# Patient Record
Sex: Female | Born: 1950
Health system: Southern US, Community
[De-identification: ages and names within clinical notes are randomized; demographics above are authoritative.]

## PROBLEM LIST (undated history)

## (undated) DIAGNOSIS — D45 Polycythemia vera: Secondary | ICD-10-CM

## (undated) DIAGNOSIS — I10 Essential (primary) hypertension: Secondary | ICD-10-CM

## (undated) DIAGNOSIS — G43909 Migraine, unspecified, not intractable, without status migrainosus: Secondary | ICD-10-CM

## (undated) DIAGNOSIS — G459 Transient cerebral ischemic attack, unspecified: Secondary | ICD-10-CM

## (undated) DIAGNOSIS — F329 Major depressive disorder, single episode, unspecified: Secondary | ICD-10-CM

## (undated) DIAGNOSIS — T8859XA Other complications of anesthesia, initial encounter: Secondary | ICD-10-CM

## (undated) DIAGNOSIS — E785 Hyperlipidemia, unspecified: Secondary | ICD-10-CM

## (undated) DIAGNOSIS — F0390 Unspecified dementia without behavioral disturbance: Secondary | ICD-10-CM

## (undated) DIAGNOSIS — M5410 Radiculopathy, site unspecified: Secondary | ICD-10-CM

## (undated) DIAGNOSIS — G540 Brachial plexus disorders: Secondary | ICD-10-CM

## (undated) DIAGNOSIS — R42 Dizziness and giddiness: Secondary | ICD-10-CM

## (undated) DIAGNOSIS — I639 Cerebral infarction, unspecified: Secondary | ICD-10-CM

## (undated) DIAGNOSIS — K219 Gastro-esophageal reflux disease without esophagitis: Secondary | ICD-10-CM

## (undated) DIAGNOSIS — J42 Unspecified chronic bronchitis: Secondary | ICD-10-CM

## (undated) DIAGNOSIS — R413 Other amnesia: Secondary | ICD-10-CM

## (undated) DIAGNOSIS — I498 Other specified cardiac arrhythmias: Secondary | ICD-10-CM

## (undated) DIAGNOSIS — F419 Anxiety disorder, unspecified: Secondary | ICD-10-CM

## (undated) DIAGNOSIS — T4145XA Adverse effect of unspecified anesthetic, initial encounter: Secondary | ICD-10-CM

## (undated) DIAGNOSIS — Z9981 Dependence on supplemental oxygen: Secondary | ICD-10-CM

## (undated) DIAGNOSIS — T7840XA Allergy, unspecified, initial encounter: Secondary | ICD-10-CM

## (undated) DIAGNOSIS — F32A Depression, unspecified: Secondary | ICD-10-CM

## (undated) DIAGNOSIS — M797 Fibromyalgia: Secondary | ICD-10-CM

## (undated) HISTORY — DX: Unspecified dementia, unspecified severity, without behavioral disturbance, psychotic disturbance, mood disturbance, and anxiety: F03.90

## (undated) HISTORY — DX: Dizziness and giddiness: R42

## (undated) HISTORY — DX: Radiculopathy, site unspecified: M54.10

## (undated) HISTORY — DX: Essential (primary) hypertension: I10

## (undated) HISTORY — DX: Hyperlipidemia, unspecified: E78.5

## (undated) HISTORY — PX: CHOLECYSTECTOMY: SHX55

## (undated) HISTORY — DX: Transient cerebral ischemic attack, unspecified: G45.9

## (undated) HISTORY — DX: Gastro-esophageal reflux disease without esophagitis: K21.9

## (undated) HISTORY — DX: Unspecified chronic bronchitis: J42

## (undated) HISTORY — DX: Other specified cardiac arrhythmias: I49.8

## (undated) HISTORY — PX: RESECTION RIB PARTIAL: SUR1246

## (undated) HISTORY — PX: DILATION AND CURETTAGE OF UTERUS: SHX78

## (undated) HISTORY — DX: Major depressive disorder, single episode, unspecified: F32.9

## (undated) HISTORY — PX: REDUCTION MAMMAPLASTY: SUR839

## (undated) HISTORY — DX: Anxiety disorder, unspecified: F41.9

## (undated) HISTORY — PX: GALLBLADDER SURGERY: SHX652

## (undated) HISTORY — PX: RHINOPLASTY: SUR1284

## (undated) HISTORY — DX: Migraine, unspecified, not intractable, without status migrainosus: G43.909

## (undated) HISTORY — DX: Other amnesia: R41.3

## (undated) HISTORY — PX: KNEE SURGERY: SHX244

## (undated) HISTORY — DX: Cerebral infarction, unspecified: I63.9

## (undated) HISTORY — DX: Depression, unspecified: F32.A

## (undated) HISTORY — DX: Brachial plexus disorders: G54.0

## (undated) HISTORY — PX: CYST REMOVAL HAND: SHX6279

## (undated) HISTORY — PX: OTHER SURGICAL HISTORY: SHX169

## (undated) HISTORY — DX: Fibromyalgia: M79.7

## (undated) HISTORY — DX: Polycythemia vera: D45

## (undated) HISTORY — DX: Allergy, unspecified, initial encounter: T78.40XA

## (undated) HISTORY — DX: Dependence on supplemental oxygen: Z99.81

## (undated) HISTORY — PX: APPENDECTOMY: SHX54

---

## 1977-12-02 HISTORY — PX: BREAST SURGERY: SHX581

## 1978-12-02 HISTORY — PX: OTHER SURGICAL HISTORY: SHX169

## 1979-12-03 DIAGNOSIS — G540 Brachial plexus disorders: Secondary | ICD-10-CM

## 1979-12-03 HISTORY — DX: Brachial plexus disorders: G54.0

## 1998-11-17 ENCOUNTER — Other Ambulatory Visit: Admission: RE | Admit: 1998-11-17 | Discharge: 1998-11-17 | Payer: Self-pay | Admitting: Gynecology

## 1998-11-18 ENCOUNTER — Emergency Department (HOSPITAL_COMMUNITY): Admission: EM | Admit: 1998-11-18 | Discharge: 1998-11-19 | Payer: Self-pay | Admitting: Emergency Medicine

## 1998-11-19 ENCOUNTER — Encounter: Payer: Self-pay | Admitting: Emergency Medicine

## 1998-11-28 ENCOUNTER — Encounter: Payer: Self-pay | Admitting: Neurology

## 1998-11-28 ENCOUNTER — Ambulatory Visit (HOSPITAL_COMMUNITY): Admission: RE | Admit: 1998-11-28 | Discharge: 1998-11-28 | Payer: Self-pay | Admitting: Neurology

## 1998-12-06 ENCOUNTER — Other Ambulatory Visit: Admission: RE | Admit: 1998-12-06 | Discharge: 1998-12-06 | Payer: Self-pay | Admitting: Gynecology

## 1998-12-12 ENCOUNTER — Ambulatory Visit (HOSPITAL_COMMUNITY): Admission: RE | Admit: 1998-12-12 | Discharge: 1998-12-12 | Payer: Self-pay | Admitting: *Deleted

## 1999-03-04 ENCOUNTER — Emergency Department (HOSPITAL_COMMUNITY): Admission: EM | Admit: 1999-03-04 | Discharge: 1999-03-04 | Payer: Self-pay | Admitting: Emergency Medicine

## 1999-04-30 ENCOUNTER — Emergency Department (HOSPITAL_COMMUNITY): Admission: EM | Admit: 1999-04-30 | Discharge: 1999-04-30 | Payer: Self-pay | Admitting: Emergency Medicine

## 1999-07-08 ENCOUNTER — Emergency Department (HOSPITAL_COMMUNITY): Admission: EM | Admit: 1999-07-08 | Discharge: 1999-07-08 | Payer: Self-pay | Admitting: Emergency Medicine

## 2000-04-15 ENCOUNTER — Other Ambulatory Visit: Admission: RE | Admit: 2000-04-15 | Discharge: 2000-04-15 | Payer: Self-pay | Admitting: *Deleted

## 2000-05-27 ENCOUNTER — Encounter: Payer: Self-pay | Admitting: Orthopaedic Surgery

## 2000-05-27 ENCOUNTER — Encounter: Admission: RE | Admit: 2000-05-27 | Discharge: 2000-05-27 | Payer: Self-pay | Admitting: Orthopaedic Surgery

## 2000-07-28 ENCOUNTER — Encounter: Admission: RE | Admit: 2000-07-28 | Discharge: 2000-07-28 | Payer: Self-pay | Admitting: Neurological Surgery

## 2000-07-28 ENCOUNTER — Encounter: Payer: Self-pay | Admitting: Neurological Surgery

## 2001-04-15 ENCOUNTER — Other Ambulatory Visit: Admission: RE | Admit: 2001-04-15 | Discharge: 2001-04-15 | Payer: Self-pay | Admitting: *Deleted

## 2002-05-03 ENCOUNTER — Other Ambulatory Visit: Admission: RE | Admit: 2002-05-03 | Discharge: 2002-05-03 | Payer: Self-pay | Admitting: Obstetrics and Gynecology

## 2003-05-06 ENCOUNTER — Other Ambulatory Visit: Admission: RE | Admit: 2003-05-06 | Discharge: 2003-05-06 | Payer: Self-pay | Admitting: Obstetrics and Gynecology

## 2003-10-10 ENCOUNTER — Encounter: Admission: RE | Admit: 2003-10-10 | Discharge: 2003-10-10 | Payer: Self-pay | Admitting: Obstetrics and Gynecology

## 2004-05-10 ENCOUNTER — Other Ambulatory Visit: Admission: RE | Admit: 2004-05-10 | Discharge: 2004-05-10 | Payer: Self-pay | Admitting: Obstetrics and Gynecology

## 2004-05-30 ENCOUNTER — Encounter: Admission: RE | Admit: 2004-05-30 | Discharge: 2004-05-30 | Payer: Self-pay | Admitting: Obstetrics and Gynecology

## 2004-10-04 ENCOUNTER — Ambulatory Visit (HOSPITAL_COMMUNITY): Admission: RE | Admit: 2004-10-04 | Discharge: 2004-10-04 | Payer: Self-pay | Admitting: Hematology & Oncology

## 2004-10-04 ENCOUNTER — Ambulatory Visit: Payer: Self-pay | Admitting: Hematology & Oncology

## 2004-10-08 ENCOUNTER — Ambulatory Visit: Payer: Self-pay | Admitting: Internal Medicine

## 2004-10-30 ENCOUNTER — Ambulatory Visit: Payer: Self-pay | Admitting: Internal Medicine

## 2004-10-31 ENCOUNTER — Encounter: Admission: RE | Admit: 2004-10-31 | Discharge: 2004-10-31 | Payer: Self-pay | Admitting: Internal Medicine

## 2004-11-08 ENCOUNTER — Ambulatory Visit: Payer: Self-pay | Admitting: Internal Medicine

## 2004-12-02 LAB — HM COLONOSCOPY

## 2004-12-10 ENCOUNTER — Ambulatory Visit: Payer: Self-pay | Admitting: Infectious Diseases

## 2004-12-10 ENCOUNTER — Ambulatory Visit: Payer: Self-pay | Admitting: Hematology & Oncology

## 2004-12-26 ENCOUNTER — Ambulatory Visit: Payer: Self-pay | Admitting: Infectious Diseases

## 2005-02-08 ENCOUNTER — Ambulatory Visit: Payer: Self-pay | Admitting: Hematology & Oncology

## 2005-03-28 ENCOUNTER — Ambulatory Visit: Payer: Self-pay | Admitting: Internal Medicine

## 2005-04-12 ENCOUNTER — Ambulatory Visit: Payer: Self-pay | Admitting: Hematology & Oncology

## 2005-05-30 ENCOUNTER — Ambulatory Visit: Payer: Self-pay | Admitting: Internal Medicine

## 2005-05-31 ENCOUNTER — Encounter: Admission: RE | Admit: 2005-05-31 | Discharge: 2005-05-31 | Payer: Self-pay | Admitting: Obstetrics and Gynecology

## 2005-06-14 ENCOUNTER — Ambulatory Visit: Payer: Self-pay | Admitting: Hematology & Oncology

## 2005-07-16 ENCOUNTER — Ambulatory Visit: Payer: Self-pay | Admitting: Internal Medicine

## 2005-08-16 ENCOUNTER — Ambulatory Visit: Payer: Self-pay | Admitting: Hematology & Oncology

## 2005-10-09 ENCOUNTER — Ambulatory Visit: Payer: Self-pay | Admitting: Hematology & Oncology

## 2005-11-05 ENCOUNTER — Ambulatory Visit: Payer: Self-pay | Admitting: Internal Medicine

## 2005-11-07 ENCOUNTER — Ambulatory Visit: Payer: Self-pay | Admitting: Internal Medicine

## 2005-11-18 ENCOUNTER — Ambulatory Visit: Payer: Self-pay | Admitting: Gastroenterology

## 2005-12-02 LAB — HM DEXA SCAN

## 2005-12-03 ENCOUNTER — Ambulatory Visit: Payer: Self-pay | Admitting: Gastroenterology

## 2005-12-03 ENCOUNTER — Encounter (INDEPENDENT_AMBULATORY_CARE_PROVIDER_SITE_OTHER): Payer: Self-pay | Admitting: Specialist

## 2005-12-05 ENCOUNTER — Ambulatory Visit: Payer: Self-pay | Admitting: Hematology & Oncology

## 2005-12-24 ENCOUNTER — Encounter: Admission: RE | Admit: 2005-12-24 | Discharge: 2005-12-24 | Payer: Self-pay | Admitting: Neurology

## 2006-01-09 ENCOUNTER — Ambulatory Visit: Payer: Self-pay | Admitting: Internal Medicine

## 2006-01-14 ENCOUNTER — Ambulatory Visit: Payer: Self-pay | Admitting: Gastroenterology

## 2006-01-29 ENCOUNTER — Ambulatory Visit: Payer: Self-pay | Admitting: Hematology & Oncology

## 2006-02-10 ENCOUNTER — Ambulatory Visit: Payer: Self-pay | Admitting: Internal Medicine

## 2006-03-23 ENCOUNTER — Emergency Department (HOSPITAL_COMMUNITY): Admission: EM | Admit: 2006-03-23 | Discharge: 2006-03-23 | Payer: Self-pay | Admitting: Emergency Medicine

## 2006-04-01 ENCOUNTER — Ambulatory Visit: Payer: Self-pay | Admitting: Hematology & Oncology

## 2006-04-02 LAB — CBC WITH DIFFERENTIAL/PLATELET
BASO%: 0.6 % (ref 0.0–2.0)
Basophils Absolute: 0 10*3/uL (ref 0.0–0.1)
EOS%: 1.6 % (ref 0.0–7.0)
HGB: 12.7 g/dL (ref 11.6–15.9)
MCH: 27.4 pg (ref 26.0–34.0)
MONO#: 0.4 10*3/uL (ref 0.1–0.9)
RDW: 16.2 % — ABNORMAL HIGH (ref 11.3–14.5)
WBC: 4.3 10*3/uL (ref 3.9–10.0)
lymph#: 1.7 10*3/uL (ref 0.9–3.3)

## 2006-04-02 LAB — FERRITIN: Ferritin: 7 ng/mL — ABNORMAL LOW (ref 10–291)

## 2006-04-15 ENCOUNTER — Ambulatory Visit (HOSPITAL_COMMUNITY): Admission: RE | Admit: 2006-04-15 | Discharge: 2006-04-15 | Payer: Self-pay | Admitting: Hematology & Oncology

## 2006-04-15 ENCOUNTER — Encounter (INDEPENDENT_AMBULATORY_CARE_PROVIDER_SITE_OTHER): Payer: Self-pay | Admitting: *Deleted

## 2006-04-15 ENCOUNTER — Ambulatory Visit: Payer: Self-pay | Admitting: Hematology & Oncology

## 2006-06-02 ENCOUNTER — Encounter: Admission: RE | Admit: 2006-06-02 | Discharge: 2006-06-02 | Payer: Self-pay | Admitting: Obstetrics and Gynecology

## 2006-06-05 ENCOUNTER — Ambulatory Visit: Payer: Self-pay | Admitting: Hematology & Oncology

## 2006-06-12 LAB — CBC WITH DIFFERENTIAL/PLATELET
BASO%: 0.5 % (ref 0.0–2.0)
EOS%: 1.6 % (ref 0.0–7.0)
HCT: 41.3 % (ref 34.8–46.6)
MCH: 28.3 pg (ref 26.0–34.0)
MCHC: 33.8 g/dL (ref 32.0–36.0)
MONO#: 0.5 10*3/uL (ref 0.1–0.9)
NEUT%: 52.3 % (ref 39.6–76.8)
RBC: 4.94 10*6/uL (ref 3.70–5.32)
WBC: 5.3 10*3/uL (ref 3.9–10.0)
lymph#: 1.9 10*3/uL (ref 0.9–3.3)

## 2006-07-23 ENCOUNTER — Ambulatory Visit: Payer: Self-pay | Admitting: Hematology & Oncology

## 2006-07-24 LAB — CBC WITH DIFFERENTIAL/PLATELET
BASO%: 0.6 % (ref 0.0–2.0)
LYMPH%: 28.7 % (ref 14.0–48.0)
MCH: 28.1 pg (ref 26.0–34.0)
MCHC: 33.8 g/dL (ref 32.0–36.0)
MCV: 83.2 fL (ref 81.0–101.0)
MONO%: 8.8 % (ref 0.0–13.0)
NEUT%: 60.6 % (ref 39.6–76.8)
Platelets: 299 10*3/uL (ref 145–400)
RBC: 5.37 10*6/uL — ABNORMAL HIGH (ref 3.70–5.32)

## 2006-07-24 LAB — COMPREHENSIVE METABOLIC PANEL
ALT: 18 U/L (ref 0–40)
AST: 20 U/L (ref 0–37)
Albumin: 4.2 g/dL (ref 3.5–5.2)
Alkaline Phosphatase: 86 U/L (ref 39–117)
BUN: 14 mg/dL (ref 6–23)
Calcium: 9.7 mg/dL (ref 8.4–10.5)
Chloride: 100 mEq/L (ref 96–112)
Potassium: 4.8 mEq/L (ref 3.5–5.3)
Sodium: 137 mEq/L (ref 135–145)
Total Protein: 6.8 g/dL (ref 6.0–8.3)

## 2006-08-01 LAB — CBC WITH DIFFERENTIAL/PLATELET
BASO%: 0.7 % (ref 0.0–2.0)
EOS%: 1.4 % (ref 0.0–7.0)
HCT: 37 % (ref 34.8–46.6)
LYMPH%: 33 % (ref 14.0–48.0)
MCH: 27.9 pg (ref 26.0–34.0)
MCHC: 33.2 g/dL (ref 32.0–36.0)
MCV: 84.1 fL (ref 81.0–101.0)
MONO%: 8.3 % (ref 0.0–13.0)
NEUT%: 56.6 % (ref 39.6–76.8)
Platelets: 307 10*3/uL (ref 145–400)
RBC: 4.4 10*6/uL (ref 3.70–5.32)
WBC: 4.7 10*3/uL (ref 3.9–10.0)

## 2006-09-09 ENCOUNTER — Ambulatory Visit: Payer: Self-pay | Admitting: Hematology & Oncology

## 2006-09-11 LAB — CBC WITH DIFFERENTIAL/PLATELET
Basophils Absolute: 0 10*3/uL (ref 0.0–0.1)
Eosinophils Absolute: 0.1 10*3/uL (ref 0.0–0.5)
HGB: 13.8 g/dL (ref 11.6–15.9)
MONO#: 0.4 10*3/uL (ref 0.1–0.9)
NEUT#: 2.8 10*3/uL (ref 1.5–6.5)
RBC: 5.02 10*6/uL (ref 3.70–5.32)
RDW: 14.4 % (ref 11.3–14.5)
WBC: 4.7 10*3/uL (ref 3.9–10.0)

## 2006-09-22 ENCOUNTER — Ambulatory Visit: Payer: Self-pay | Admitting: Internal Medicine

## 2006-10-20 ENCOUNTER — Ambulatory Visit: Payer: Self-pay | Admitting: Hematology & Oncology

## 2006-10-20 LAB — CBC WITH DIFFERENTIAL/PLATELET
BASO%: 0.5 % (ref 0.0–2.0)
LYMPH%: 23.6 % (ref 14.0–48.0)
MCHC: 32.6 g/dL (ref 32.0–36.0)
MONO#: 0.5 10*3/uL (ref 0.1–0.9)
NEUT#: 3.9 10*3/uL (ref 1.5–6.5)
RBC: 4.36 10*6/uL (ref 3.70–5.32)
RDW: 14.3 % (ref 11.3–14.5)
WBC: 5.9 10*3/uL (ref 3.9–10.0)
lymph#: 1.4 10*3/uL (ref 0.9–3.3)

## 2006-12-01 LAB — CBC & DIFF AND RETIC
Basophils Absolute: 0 10*3/uL (ref 0.0–0.1)
Eosinophils Absolute: 0.1 10*3/uL (ref 0.0–0.5)
HCT: 38.6 % (ref 34.8–46.6)
HGB: 12.2 g/dL (ref 11.6–15.9)
IRF: 0.33 (ref 0.130–0.330)
NEUT#: 3.4 10*3/uL (ref 1.5–6.5)
NEUT%: 67.6 % (ref 39.6–76.8)
RDW: 15.8 % — ABNORMAL HIGH (ref 11.3–14.5)
RETIC #: 75.8 10*3/uL (ref 19.7–115.1)
lymph#: 1.2 10*3/uL (ref 0.9–3.3)

## 2006-12-01 LAB — CHCC SMEAR

## 2006-12-01 LAB — FERRITIN: Ferritin: 4 ng/mL — ABNORMAL LOW (ref 10–291)

## 2006-12-03 ENCOUNTER — Ambulatory Visit (HOSPITAL_COMMUNITY): Admission: RE | Admit: 2006-12-03 | Discharge: 2006-12-03 | Payer: Self-pay | Admitting: Hematology & Oncology

## 2006-12-10 ENCOUNTER — Ambulatory Visit: Payer: Self-pay | Admitting: Hematology & Oncology

## 2006-12-15 LAB — CBC WITH DIFFERENTIAL/PLATELET
BASO%: 0.4 % (ref 0.0–2.0)
HCT: 34.6 % — ABNORMAL LOW (ref 34.8–46.6)
MCHC: 32.6 g/dL (ref 32.0–36.0)
MONO#: 0.4 10*3/uL (ref 0.1–0.9)
NEUT#: 3.3 10*3/uL (ref 1.5–6.5)
RBC: 4.73 10*6/uL (ref 3.70–5.32)
WBC: 5 10*3/uL (ref 3.9–10.0)
lymph#: 1.3 10*3/uL (ref 0.9–3.3)

## 2006-12-18 LAB — LUPUS ANTICOAGULANT PANEL
DRVVT: 37 secs (ref 31.9–44.2)
PTT Lupus Anticoagulant: 44 secs (ref 36.3–48.8)

## 2006-12-18 LAB — HOMOCYSTEINE: Homocysteine: 9.8 umol/L (ref 4.0–15.4)

## 2006-12-18 LAB — BETA-2 GLYCOPROTEIN ANTIBODIES
Beta-2 Glyco I IgG: <4 U/mL (ref <20)
Beta-2-Glycoprotein I IgA: <4 U/mL (ref <10)

## 2007-01-22 ENCOUNTER — Ambulatory Visit: Payer: Self-pay | Admitting: Hematology & Oncology

## 2007-01-26 LAB — CBC WITH DIFFERENTIAL/PLATELET
BASO%: 0.4 % (ref 0.0–2.0)
EOS%: 0.8 % (ref 0.0–7.0)
LYMPH%: 24.4 % (ref 14.0–48.0)
MCH: 24.2 pg — ABNORMAL LOW (ref 26.0–34.0)
MCHC: 33.2 g/dL (ref 32.0–36.0)
MCV: 72.7 fL — ABNORMAL LOW (ref 81.0–101.0)
MONO%: 6.9 % (ref 0.0–13.0)
NEUT#: 3.7 10*3/uL (ref 1.5–6.5)
Platelets: 354 10*3/uL (ref 145–400)
RBC: 4.99 10*6/uL (ref 3.70–5.32)
RDW: 17.2 % — ABNORMAL HIGH (ref 11.3–14.5)

## 2007-01-28 LAB — VISCOSITY, SERUM: Viscosity, Serum: 1.2 mPa.S (ref 1.1–2.0)

## 2007-01-28 LAB — FERRITIN: Ferritin: 4 ng/mL — ABNORMAL LOW (ref 10–291)

## 2007-03-18 ENCOUNTER — Ambulatory Visit: Payer: Self-pay | Admitting: Hematology & Oncology

## 2007-03-23 LAB — CBC WITH DIFFERENTIAL/PLATELET
BASO%: 0.6 % (ref 0.0–2.0)
LYMPH%: 31.6 % (ref 14.0–48.0)
MCHC: 33.9 g/dL (ref 32.0–36.0)
MONO#: 0.5 10*3/uL (ref 0.1–0.9)
Platelets: 282 10*3/uL (ref 145–400)
RBC: 4.96 10*6/uL (ref 3.70–5.32)
RDW: 16.7 % — ABNORMAL HIGH (ref 11.3–14.5)
WBC: 4.6 10*3/uL (ref 3.9–10.0)

## 2007-03-23 LAB — HEPATIC FUNCTION PANEL
ALT: 19 U/L (ref 0–35)
AST: 22 U/L (ref 0–37)
Albumin: 4.5 g/dL (ref 3.5–5.2)
Total Protein: 7 g/dL (ref 6.0–8.3)

## 2007-05-08 ENCOUNTER — Ambulatory Visit: Payer: Self-pay | Admitting: Hematology & Oncology

## 2007-05-08 LAB — CBC WITH DIFFERENTIAL/PLATELET
BASO%: 0.6 % (ref 0.0–2.0)
HCT: 38 % (ref 34.8–46.6)
MCHC: 33.6 g/dL (ref 32.0–36.0)
MONO#: 0.5 10*3/uL (ref 0.1–0.9)
NEUT#: 3 10*3/uL (ref 1.5–6.5)
NEUT%: 59 % (ref 39.6–76.8)
RBC: 4.97 10*6/uL (ref 3.70–5.32)
WBC: 5.1 10*3/uL (ref 3.9–10.0)
lymph#: 1.5 10*3/uL (ref 0.9–3.3)

## 2007-06-04 LAB — CBC WITH DIFFERENTIAL/PLATELET
Basophils Absolute: 0 10*3/uL (ref 0.0–0.1)
HCT: 38.1 % (ref 34.8–46.6)
HGB: 13.2 g/dL (ref 11.6–15.9)
MONO#: 0.5 10*3/uL (ref 0.1–0.9)
NEUT%: 55.7 % (ref 39.6–76.8)
WBC: 5.7 10*3/uL (ref 3.9–10.0)
lymph#: 1.8 10*3/uL (ref 0.9–3.3)

## 2007-06-22 ENCOUNTER — Encounter: Admission: RE | Admit: 2007-06-22 | Discharge: 2007-06-22 | Payer: Self-pay | Admitting: Neurology

## 2007-07-03 ENCOUNTER — Encounter: Payer: Self-pay | Admitting: Internal Medicine

## 2007-07-04 DIAGNOSIS — E785 Hyperlipidemia, unspecified: Secondary | ICD-10-CM | POA: Insufficient documentation

## 2007-07-04 DIAGNOSIS — D45 Polycythemia vera: Secondary | ICD-10-CM

## 2007-07-04 DIAGNOSIS — Z87898 Personal history of other specified conditions: Secondary | ICD-10-CM

## 2007-07-04 DIAGNOSIS — E66811 Obesity, class 1: Secondary | ICD-10-CM | POA: Insufficient documentation

## 2007-07-04 DIAGNOSIS — J3089 Other allergic rhinitis: Secondary | ICD-10-CM

## 2007-07-04 DIAGNOSIS — K219 Gastro-esophageal reflux disease without esophagitis: Secondary | ICD-10-CM | POA: Insufficient documentation

## 2007-07-04 DIAGNOSIS — I10 Essential (primary) hypertension: Secondary | ICD-10-CM

## 2007-07-04 DIAGNOSIS — J302 Other seasonal allergic rhinitis: Secondary | ICD-10-CM

## 2007-07-04 DIAGNOSIS — R51 Headache: Secondary | ICD-10-CM

## 2007-07-04 DIAGNOSIS — E669 Obesity, unspecified: Secondary | ICD-10-CM | POA: Insufficient documentation

## 2007-07-04 DIAGNOSIS — Z8673 Personal history of transient ischemic attack (TIA), and cerebral infarction without residual deficits: Secondary | ICD-10-CM | POA: Insufficient documentation

## 2007-07-04 DIAGNOSIS — R519 Headache, unspecified: Secondary | ICD-10-CM | POA: Insufficient documentation

## 2007-07-04 DIAGNOSIS — M797 Fibromyalgia: Secondary | ICD-10-CM

## 2007-07-04 DIAGNOSIS — J4520 Mild intermittent asthma, uncomplicated: Secondary | ICD-10-CM | POA: Insufficient documentation

## 2007-07-04 DIAGNOSIS — N840 Polyp of corpus uteri: Secondary | ICD-10-CM

## 2007-07-06 ENCOUNTER — Encounter: Admission: RE | Admit: 2007-07-06 | Discharge: 2007-07-06 | Payer: Self-pay | Admitting: Obstetrics and Gynecology

## 2007-07-29 ENCOUNTER — Ambulatory Visit: Payer: Self-pay | Admitting: Hematology & Oncology

## 2007-07-31 LAB — CBC WITH DIFFERENTIAL/PLATELET
Basophils Absolute: 0 10*3/uL (ref 0.0–0.1)
Eosinophils Absolute: 0.1 10*3/uL (ref 0.0–0.5)
HGB: 13 g/dL (ref 11.6–15.9)
MCV: 78.2 fL — ABNORMAL LOW (ref 81.0–101.0)
MONO%: 7.7 % (ref 0.0–13.0)
NEUT#: 2.5 10*3/uL (ref 1.5–6.5)
RDW: 15.3 % — ABNORMAL HIGH (ref 11.3–14.5)

## 2007-08-01 LAB — C-REACTIVE PROTEIN: CRP: 0.6 mg/dL — ABNORMAL HIGH

## 2007-08-04 LAB — ANA: Anti Nuclear Antibody(ANA): NEGATIVE

## 2007-08-05 LAB — RHEUMATOID FACTOR: Rhuematoid fact SerPl-aCnc: <20 IU/mL (ref 0–20)

## 2007-08-05 LAB — HLA-B27 ANTIGEN

## 2007-09-16 ENCOUNTER — Ambulatory Visit: Payer: Self-pay | Admitting: Hematology & Oncology

## 2007-09-18 LAB — CBC WITH DIFFERENTIAL/PLATELET
BASO%: 0.5 % (ref 0.0–2.0)
EOS%: 1.6 % (ref 0.0–7.0)
HGB: 12.7 g/dL (ref 11.6–15.9)
MCH: 27 pg (ref 26.0–34.0)
MCHC: 34.3 g/dL (ref 32.0–36.0)
RBC: 4.68 10*6/uL (ref 3.70–5.32)
RDW: 14.8 % — ABNORMAL HIGH (ref 11.3–14.5)
lymph#: 1.7 10*3/uL (ref 0.9–3.3)

## 2007-09-18 LAB — FERRITIN: Ferritin: 6 ng/mL — ABNORMAL LOW (ref 10–291)

## 2007-09-18 LAB — CHCC SMEAR

## 2007-09-23 ENCOUNTER — Encounter: Payer: Self-pay | Admitting: *Deleted

## 2007-10-15 ENCOUNTER — Telehealth (INDEPENDENT_AMBULATORY_CARE_PROVIDER_SITE_OTHER): Payer: Self-pay | Admitting: *Deleted

## 2007-10-16 ENCOUNTER — Ambulatory Visit: Payer: Self-pay | Admitting: Internal Medicine

## 2007-10-16 DIAGNOSIS — J069 Acute upper respiratory infection, unspecified: Secondary | ICD-10-CM | POA: Insufficient documentation

## 2007-10-20 ENCOUNTER — Encounter: Payer: Self-pay | Admitting: Internal Medicine

## 2007-10-20 DIAGNOSIS — M5137 Other intervertebral disc degeneration, lumbosacral region: Secondary | ICD-10-CM

## 2007-10-20 DIAGNOSIS — F331 Major depressive disorder, recurrent, moderate: Secondary | ICD-10-CM

## 2007-10-20 DIAGNOSIS — G43009 Migraine without aura, not intractable, without status migrainosus: Secondary | ICD-10-CM

## 2007-10-26 ENCOUNTER — Ambulatory Visit: Payer: Self-pay | Admitting: Hematology & Oncology

## 2007-10-28 ENCOUNTER — Telehealth: Payer: Self-pay | Admitting: Internal Medicine

## 2007-10-28 ENCOUNTER — Encounter: Payer: Self-pay | Admitting: Internal Medicine

## 2007-11-02 ENCOUNTER — Encounter: Payer: Self-pay | Admitting: Internal Medicine

## 2007-11-02 LAB — FERRITIN: Ferritin: 7 ng/mL — ABNORMAL LOW (ref 10–291)

## 2007-11-02 LAB — CBC & DIFF AND RETIC
Basophils Absolute: 0 10*3/uL (ref 0.0–0.1)
Eosinophils Absolute: 0.1 10*3/uL (ref 0.0–0.5)
HCT: 37.6 % (ref 34.8–46.6)
HGB: 12.8 g/dL (ref 11.6–15.9)
LYMPH%: 36.3 % (ref 14.0–48.0)
MCV: 78.5 fL — ABNORMAL LOW (ref 81.0–101.0)
MONO%: 7.8 % (ref 0.0–13.0)
NEUT#: 2.8 10*3/uL (ref 1.5–6.5)
NEUT%: 53.5 % (ref 39.6–76.8)
Platelets: 268 10*3/uL (ref 145–400)
RBC: 4.79 10*6/uL (ref 3.70–5.32)

## 2007-11-17 ENCOUNTER — Ambulatory Visit: Payer: Self-pay | Admitting: Internal Medicine

## 2007-11-17 LAB — CONVERTED CEMR LAB
ALT: 30 units/L (ref 0–35)
AST: 29 units/L (ref 0–37)
Albumin: 3.5 g/dL (ref 3.5–5.2)
Alkaline Phosphatase: 70 units/L (ref 39–117)
BUN: 12 mg/dL (ref 6–23)
Basophils Absolute: 0 10*3/uL (ref 0.0–0.1)
Basophils Relative: 0.4 % (ref 0.0–1.0)
Bilirubin Urine: NEGATIVE
Bilirubin, Direct: 0.1 mg/dL (ref 0.0–0.3)
CO2: 26 meq/L (ref 19–32)
Calcium: 8.7 mg/dL (ref 8.4–10.5)
Chloride: 109 meq/L (ref 96–112)
Cholesterol: 171 mg/dL (ref 0–200)
Creatinine, Ser: 0.8 mg/dL (ref 0.4–1.2)
Crystals: NEGATIVE
Eosinophils Absolute: 0.1 10*3/uL (ref 0.0–0.6)
Eosinophils Relative: 2.8 % (ref 0.0–5.0)
GFR calc Af Amer: 95 mL/min
GFR calc non Af Amer: 79 mL/min
Glucose, Bld: 107 mg/dL — ABNORMAL HIGH (ref 70–99)
HCT: 35.2 % — ABNORMAL LOW (ref 36.0–46.0)
HDL: 69.4 mg/dL (ref >39.0)
Hemoglobin, Urine: NEGATIVE
Hemoglobin: 11.9 g/dL — ABNORMAL LOW (ref 12.0–15.0)
Ketones, ur: NEGATIVE mg/dL
LDL Cholesterol: 79 mg/dL (ref 0–99)
Lymphocytes Relative: 38.6 % (ref 12.0–46.0)
MCHC: 33.7 g/dL (ref 30.0–36.0)
MCV: 79.4 fL (ref 78.0–100.0)
Monocytes Absolute: 0.5 10*3/uL (ref 0.2–0.7)
Monocytes Relative: 11.2 % — ABNORMAL HIGH (ref 3.0–11.0)
Mucus, UA: NEGATIVE
Neutro Abs: 2.2 10*3/uL (ref 1.4–7.7)
Neutrophils Relative %: 47 % (ref 43.0–77.0)
Nitrite: NEGATIVE
Platelets: 237 10*3/uL (ref 150–400)
Potassium: 3.7 meq/L (ref 3.5–5.1)
RBC / HPF: NONE SEEN
RBC: 4.43 M/uL (ref 3.87–5.11)
RDW: 13.6 % (ref 11.5–14.6)
Sodium: 142 meq/L (ref 135–145)
Specific Gravity, Urine: 1.025 (ref 1.000–1.03)
TSH: 2.16 microintl units/mL (ref 0.35–5.50)
Total Bilirubin: 0.6 mg/dL (ref 0.3–1.2)
Total CHOL/HDL Ratio: 2.5
Total Protein, Urine: NEGATIVE mg/dL
Total Protein: 6.2 g/dL (ref 6.0–8.3)
Triglycerides: 114 mg/dL (ref 0–149)
Urine Glucose: NEGATIVE mg/dL
Urobilinogen, UA: 0.2 (ref 0.0–1.0)
VLDL: 23 mg/dL (ref 0–40)
WBC: 4.6 10*3/uL (ref 4.5–10.5)
pH: 5.5 (ref 5.0–8.0)

## 2007-12-23 ENCOUNTER — Ambulatory Visit: Payer: Self-pay | Admitting: Internal Medicine

## 2007-12-31 ENCOUNTER — Ambulatory Visit: Payer: Self-pay | Admitting: Hematology & Oncology

## 2008-01-04 ENCOUNTER — Encounter: Payer: Self-pay | Admitting: Internal Medicine

## 2008-01-04 LAB — CBC & DIFF AND RETIC
Basophils Absolute: 0 10*3/uL (ref 0.0–0.1)
EOS%: 0.7 % (ref 0.0–7.0)
Eosinophils Absolute: 0 10*3/uL (ref 0.0–0.5)
HGB: 13.4 g/dL (ref 11.6–15.9)
LYMPH%: 27 % (ref 14.0–48.0)
MCH: 25.6 pg — ABNORMAL LOW (ref 26.0–34.0)
MCV: 77.7 fL — ABNORMAL LOW (ref 81.0–101.0)
MONO%: 5.2 % (ref 0.0–13.0)
NEUT#: 3.7 10*3/uL (ref 1.5–6.5)
NEUT%: 66.8 % (ref 39.6–76.8)
Platelets: 299 10*3/uL (ref 145–400)
RDW: 14.4 % (ref 11.3–14.5)

## 2008-01-04 LAB — FERRITIN: Ferritin: 6 ng/mL — ABNORMAL LOW (ref 10–291)

## 2008-01-04 LAB — COMPREHENSIVE METABOLIC PANEL
ALT: 16 U/L (ref 0–35)
AST: 17 U/L (ref 0–37)
BUN: 15 mg/dL (ref 6–23)
CO2: 22 mEq/L (ref 19–32)
Calcium: 9.5 mg/dL (ref 8.4–10.5)
Chloride: 108 mEq/L (ref 96–112)
Creatinine, Ser: 0.83 mg/dL (ref 0.40–1.20)
Total Bilirubin: 0.5 mg/dL (ref 0.3–1.2)

## 2008-02-11 ENCOUNTER — Ambulatory Visit: Payer: Self-pay | Admitting: Hematology & Oncology

## 2008-02-15 ENCOUNTER — Encounter: Payer: Self-pay | Admitting: Internal Medicine

## 2008-02-15 LAB — CBC & DIFF AND RETIC
Basophils Absolute: 0 10*3/uL (ref 0.0–0.1)
EOS%: 1.1 % (ref 0.0–7.0)
Eosinophils Absolute: 0.1 10*3/uL (ref 0.0–0.5)
HGB: 11.4 g/dL — ABNORMAL LOW (ref 11.6–15.9)
IRF: 0.38 — ABNORMAL HIGH (ref 0.130–0.330)
NEUT#: 4.1 10*3/uL (ref 1.5–6.5)
RBC: 4.57 10*6/uL (ref 3.70–5.32)
RDW: 14.3 % (ref 11.3–14.5)
RETIC #: 68.1 10*3/uL (ref 19.7–115.1)
Retic %: 1.5 % (ref 0.4–2.3)
WBC: 6.1 10*3/uL (ref 3.9–10.0)
lymph#: 1.5 10*3/uL (ref 0.9–3.3)

## 2008-02-15 LAB — COMPREHENSIVE METABOLIC PANEL
ALT: 17 U/L (ref 0–35)
AST: 16 U/L (ref 0–37)
Alkaline Phosphatase: 75 U/L (ref 39–117)
BUN: 13 mg/dL (ref 6–23)
Calcium: 9.7 mg/dL (ref 8.4–10.5)
Chloride: 102 mEq/L (ref 96–112)
Creatinine, Ser: 0.87 mg/dL (ref 0.40–1.20)
Potassium: 4.3 mEq/L (ref 3.5–5.3)

## 2008-02-17 ENCOUNTER — Emergency Department (HOSPITAL_COMMUNITY): Admission: EM | Admit: 2008-02-17 | Discharge: 2008-02-17 | Payer: Self-pay | Admitting: Emergency Medicine

## 2008-03-17 ENCOUNTER — Encounter: Payer: Self-pay | Admitting: Internal Medicine

## 2008-04-07 ENCOUNTER — Ambulatory Visit: Payer: Self-pay | Admitting: Hematology & Oncology

## 2008-04-11 ENCOUNTER — Encounter: Payer: Self-pay | Admitting: Internal Medicine

## 2008-04-11 LAB — FERRITIN: Ferritin: 4 ng/mL — ABNORMAL LOW (ref 10–291)

## 2008-04-11 LAB — CBC WITH DIFFERENTIAL/PLATELET
BASO%: 1.4 % (ref 0.0–2.0)
Basophils Absolute: 0.1 10*3/uL (ref 0.0–0.1)
EOS%: 1.5 % (ref 0.0–7.0)
HGB: 11.7 g/dL (ref 11.6–15.9)
MCH: 23 pg — ABNORMAL LOW (ref 26.0–34.0)
MCHC: 32.6 g/dL (ref 32.0–36.0)
RDW: 16.1 % — ABNORMAL HIGH (ref 11.3–14.5)
WBC: 4.7 10*3/uL (ref 3.9–10.0)
lymph#: 1.6 10*3/uL (ref 0.9–3.3)

## 2008-06-14 ENCOUNTER — Ambulatory Visit: Payer: Self-pay | Admitting: Hematology & Oncology

## 2008-06-15 ENCOUNTER — Encounter: Payer: Self-pay | Admitting: Internal Medicine

## 2008-06-15 LAB — URINALYSIS, MICROSCOPIC (CHCC SATELLITE)
Glucose: NEGATIVE g/dL
Nitrite: NEGATIVE
Protein: NEGATIVE mg/dL
RBC: NEGATIVE (ref 0–2)

## 2008-06-15 LAB — CBC WITH DIFFERENTIAL (CANCER CENTER ONLY)
BASO#: 0 10*3/uL (ref 0.0–0.2)
BASO%: 0.7 % (ref 0.0–2.0)
EOS%: 2.5 % (ref 0.0–7.0)
HCT: 38.5 % (ref 34.8–46.6)
HGB: 12.4 g/dL (ref 11.6–15.9)
LYMPH#: 2.1 10*3/uL (ref 0.9–3.3)
MCHC: 32.2 g/dL (ref 32.0–36.0)
MONO#: 0.5 10*3/uL (ref 0.1–0.9)
NEUT#: 3.1 10*3/uL (ref 1.5–6.5)
NEUT%: 53.1 % (ref 39.6–80.0)
RDW: 16.6 % — ABNORMAL HIGH (ref 10.5–14.6)
WBC: 5.9 10*3/uL (ref 3.9–10.0)

## 2008-06-16 LAB — LIPID PANEL
HDL: 65 mg/dL (ref >39)
LDL Cholesterol: 74 mg/dL (ref 0–99)
Total CHOL/HDL Ratio: 2.7 Ratio
VLDL: 35 mg/dL (ref 0–40)

## 2008-06-16 LAB — CMP AND LIVER
AST: 23 U/L (ref 0–37)
Alkaline Phosphatase: 77 U/L (ref 39–117)
Glucose, Bld: 99 mg/dL (ref 70–99)
Indirect Bilirubin: 0.3 mg/dL (ref 0.0–0.9)
Sodium: 136 mEq/L (ref 135–145)
Total Bilirubin: 0.4 mg/dL (ref 0.3–1.2)
Total Protein: 6.6 g/dL (ref 6.0–8.3)

## 2008-06-16 LAB — VITAMIN B12: Vitamin B-12: 468 pg/mL (ref 211–911)

## 2008-06-16 LAB — FOLATE: Folate: >20 ng/mL

## 2008-06-20 ENCOUNTER — Ambulatory Visit: Payer: Self-pay | Admitting: Internal Medicine

## 2008-06-21 LAB — CONVERTED CEMR LAB: Pap Smear: NORMAL

## 2008-07-04 ENCOUNTER — Telehealth (INDEPENDENT_AMBULATORY_CARE_PROVIDER_SITE_OTHER): Payer: Self-pay | Admitting: *Deleted

## 2008-07-20 ENCOUNTER — Ambulatory Visit: Payer: Self-pay | Admitting: Internal Medicine

## 2008-07-20 DIAGNOSIS — H6690 Otitis media, unspecified, unspecified ear: Secondary | ICD-10-CM | POA: Insufficient documentation

## 2008-07-21 ENCOUNTER — Encounter: Payer: Self-pay | Admitting: Internal Medicine

## 2008-08-01 ENCOUNTER — Encounter: Admission: RE | Admit: 2008-08-01 | Discharge: 2008-08-01 | Payer: Self-pay | Admitting: Obstetrics and Gynecology

## 2008-08-01 ENCOUNTER — Encounter: Payer: Self-pay | Admitting: Internal Medicine

## 2008-08-11 ENCOUNTER — Ambulatory Visit: Payer: Self-pay | Admitting: Hematology & Oncology

## 2008-08-12 ENCOUNTER — Encounter: Payer: Self-pay | Admitting: Internal Medicine

## 2008-08-12 LAB — CBC WITH DIFFERENTIAL (CANCER CENTER ONLY)
BASO%: 0.5 % (ref 0.0–2.0)
EOS%: 2.6 % (ref 0.0–7.0)
LYMPH#: 1.5 10*3/uL (ref 0.9–3.3)
MCHC: 32.6 g/dL (ref 32.0–36.0)
MONO#: 0.5 10*3/uL (ref 0.1–0.9)
NEUT#: 3.6 10*3/uL (ref 1.5–6.5)
NEUT%: 62.9 % (ref 39.6–80.0)
Platelets: 271 10*3/uL (ref 145–400)
RDW: 16.6 % — ABNORMAL HIGH (ref 10.5–14.6)
WBC: 5.7 10*3/uL (ref 3.9–10.0)

## 2008-08-15 LAB — COMPREHENSIVE METABOLIC PANEL
ALT: 28 U/L (ref 0–35)
AST: 22 U/L (ref 0–37)
Calcium: 9.3 mg/dL (ref 8.4–10.5)
Chloride: 109 mEq/L (ref 96–112)
Creatinine, Ser: 0.7 mg/dL (ref 0.40–1.20)
Sodium: 141 mEq/L (ref 135–145)
Total Bilirubin: 0.3 mg/dL (ref 0.3–1.2)

## 2008-08-30 ENCOUNTER — Ambulatory Visit: Payer: Self-pay | Admitting: Internal Medicine

## 2008-08-30 DIAGNOSIS — R42 Dizziness and giddiness: Secondary | ICD-10-CM

## 2008-09-09 ENCOUNTER — Telehealth: Payer: Self-pay | Admitting: Internal Medicine

## 2008-09-09 ENCOUNTER — Encounter: Payer: Self-pay | Admitting: Internal Medicine

## 2008-09-12 ENCOUNTER — Encounter: Payer: Self-pay | Admitting: Internal Medicine

## 2008-09-12 LAB — CBC WITH DIFFERENTIAL (CANCER CENTER ONLY)
BASO%: 0.7 % (ref 0.0–2.0)
EOS%: 1.7 % (ref 0.0–7.0)
HCT: 37.1 % (ref 34.8–46.6)
LYMPH%: 40.2 % (ref 14.0–48.0)
MCH: 23.1 pg — ABNORMAL LOW (ref 26.0–34.0)
MCHC: 32.5 g/dL (ref 32.0–36.0)
MCV: 71 fL — ABNORMAL LOW (ref 81–101)
MONO#: 0.6 10*3/uL (ref 0.1–0.9)
MONO%: 9.3 % (ref 0.0–13.0)
NEUT%: 48.1 % (ref 39.6–80.0)
Platelets: 268 10*3/uL (ref 145–400)
RDW: 16.1 % — ABNORMAL HIGH (ref 10.5–14.6)
WBC: 6.2 10*3/uL (ref 3.9–10.0)

## 2008-09-30 ENCOUNTER — Encounter: Payer: Self-pay | Admitting: Internal Medicine

## 2008-10-03 ENCOUNTER — Ambulatory Visit: Payer: Self-pay | Admitting: Internal Medicine

## 2008-10-03 DIAGNOSIS — D239 Other benign neoplasm of skin, unspecified: Secondary | ICD-10-CM | POA: Insufficient documentation

## 2008-10-03 DIAGNOSIS — S82899A Other fracture of unspecified lower leg, initial encounter for closed fracture: Secondary | ICD-10-CM | POA: Insufficient documentation

## 2008-10-11 ENCOUNTER — Encounter: Payer: Self-pay | Admitting: Internal Medicine

## 2008-10-11 ENCOUNTER — Ambulatory Visit: Payer: Self-pay | Admitting: Internal Medicine

## 2008-10-16 ENCOUNTER — Telehealth: Payer: Self-pay | Admitting: Internal Medicine

## 2008-11-15 ENCOUNTER — Ambulatory Visit: Payer: Self-pay | Admitting: Hematology & Oncology

## 2008-11-16 ENCOUNTER — Encounter: Payer: Self-pay | Admitting: Internal Medicine

## 2008-11-16 LAB — CBC WITH DIFFERENTIAL (CANCER CENTER ONLY)
BASO#: 0 10*3/uL (ref 0.0–0.2)
Eosinophils Absolute: 0.1 10*3/uL (ref 0.0–0.5)
HCT: 37.9 % (ref 34.8–46.6)
HGB: 12.1 g/dL (ref 11.6–15.9)
LYMPH%: 25.5 % (ref 14.0–48.0)
MCH: 24 pg — ABNORMAL LOW (ref 26.0–34.0)
MCV: 75 fL — ABNORMAL LOW (ref 81–101)
MONO%: 6.7 % (ref 0.0–13.0)
RBC: 5.06 10*6/uL (ref 3.70–5.32)

## 2008-11-16 LAB — FERRITIN: Ferritin: 6 ng/mL — ABNORMAL LOW (ref 10–291)

## 2008-11-16 LAB — CHCC SATELLITE - SMEAR

## 2008-12-19 ENCOUNTER — Telehealth: Payer: Self-pay | Admitting: Internal Medicine

## 2008-12-28 ENCOUNTER — Encounter: Payer: Self-pay | Admitting: Internal Medicine

## 2008-12-28 LAB — FERRITIN: Ferritin: 6 ng/mL — ABNORMAL LOW (ref 10–291)

## 2008-12-28 LAB — CBC WITH DIFFERENTIAL (CANCER CENTER ONLY)
BASO#: 0 10*3/uL (ref 0.0–0.2)
Eosinophils Absolute: 0.1 10*3/uL (ref 0.0–0.5)
HGB: 11.7 g/dL (ref 11.6–15.9)
LYMPH#: 1.6 10*3/uL (ref 0.9–3.3)
MCH: 24.6 pg — ABNORMAL LOW (ref 26.0–34.0)
NEUT#: 3.1 10*3/uL (ref 1.5–6.5)
RBC: 4.74 10*6/uL (ref 3.70–5.32)

## 2008-12-28 LAB — COMPREHENSIVE METABOLIC PANEL
Alkaline Phosphatase: 87 U/L (ref 39–117)
BUN: 18 mg/dL (ref 6–23)
CO2: 22 mEq/L (ref 19–32)
Creatinine, Ser: 0.83 mg/dL (ref 0.40–1.20)
Glucose, Bld: 101 mg/dL — ABNORMAL HIGH (ref 70–99)
Total Bilirubin: 0.4 mg/dL (ref 0.3–1.2)
Total Protein: 6.6 g/dL (ref 6.0–8.3)

## 2009-02-01 ENCOUNTER — Ambulatory Visit: Payer: Self-pay | Admitting: Hematology & Oncology

## 2009-02-02 ENCOUNTER — Encounter: Payer: Self-pay | Admitting: Internal Medicine

## 2009-02-02 LAB — CBC WITH DIFFERENTIAL (CANCER CENTER ONLY)
BASO#: 0 10*3/uL (ref 0.0–0.2)
BASO%: 0.4 % (ref 0.0–2.0)
EOS%: 2.1 % (ref 0.0–7.0)
HGB: 12.6 g/dL (ref 11.6–15.9)
LYMPH#: 1.7 10*3/uL (ref 0.9–3.3)
MCHC: 32.5 g/dL (ref 32.0–36.0)
NEUT#: 2.4 10*3/uL (ref 1.5–6.5)
RBC: 5.02 10*6/uL (ref 3.70–5.32)
WBC: 4.5 10*3/uL (ref 3.9–10.0)

## 2009-02-02 LAB — CHCC SATELLITE - SMEAR

## 2009-03-22 ENCOUNTER — Ambulatory Visit: Payer: Self-pay | Admitting: Hematology & Oncology

## 2009-03-23 LAB — CBC WITH DIFFERENTIAL (CANCER CENTER ONLY)
BASO%: 0.5 % (ref 0.0–2.0)
EOS%: 1 % (ref 0.0–7.0)
LYMPH#: 2 10*3/uL (ref 0.9–3.3)
MCHC: 32.4 g/dL (ref 32.0–36.0)
NEUT#: 6.7 10*3/uL — ABNORMAL HIGH (ref 1.5–6.5)
NEUT%: 71.6 % (ref 39.6–80.0)
Platelets: 284 10*3/uL (ref 145–400)
RDW: 14 % (ref 10.5–14.6)

## 2009-03-23 LAB — COMPREHENSIVE METABOLIC PANEL
AST: 16 U/L (ref 0–37)
Alkaline Phosphatase: 73 U/L (ref 39–117)
BUN: 16 mg/dL (ref 6–23)
Calcium: 8.9 mg/dL (ref 8.4–10.5)
Chloride: 106 mEq/L (ref 96–112)
Creatinine, Ser: 0.82 mg/dL (ref 0.40–1.20)

## 2009-05-09 ENCOUNTER — Ambulatory Visit: Payer: Self-pay | Admitting: Hematology & Oncology

## 2009-05-15 LAB — CBC WITH DIFFERENTIAL (CANCER CENTER ONLY)
BASO%: 0.5 % (ref 0.0–2.0)
EOS%: 1.6 % (ref 0.0–7.0)
HCT: 37.9 % (ref 34.8–46.6)
LYMPH%: 27.9 % (ref 14.0–48.0)
MCHC: 31.8 g/dL — ABNORMAL LOW (ref 32.0–36.0)
MCV: 73 fL — ABNORMAL LOW (ref 81–101)
MONO%: 6.9 % (ref 0.0–13.0)
NEUT%: 63.1 % (ref 39.6–80.0)
RDW: 15.2 % — ABNORMAL HIGH (ref 10.5–14.6)

## 2009-05-15 LAB — COMPREHENSIVE METABOLIC PANEL
ALT: 15 U/L (ref 0–35)
AST: 20 U/L (ref 0–37)
Albumin: 4.2 g/dL (ref 3.5–5.2)
Calcium: 9.4 mg/dL (ref 8.4–10.5)
Chloride: 106 mEq/L (ref 96–112)
Potassium: 4.8 mEq/L (ref 3.5–5.3)
Sodium: 137 mEq/L (ref 135–145)

## 2009-05-15 LAB — RETICULOCYTES (CHCC)
ABS Retic: 46 10*3/uL (ref 19.0–186.0)
RBC.: 5.11 MIL/uL (ref 3.87–5.11)

## 2009-06-03 ENCOUNTER — Encounter: Admission: RE | Admit: 2009-06-03 | Discharge: 2009-06-03 | Payer: Self-pay | Admitting: Anesthesiology

## 2009-06-30 ENCOUNTER — Ambulatory Visit: Payer: Self-pay | Admitting: Hematology & Oncology

## 2009-07-02 LAB — HM PAP SMEAR

## 2009-07-02 LAB — HM MAMMOGRAPHY

## 2009-07-03 ENCOUNTER — Encounter: Payer: Self-pay | Admitting: Gastroenterology

## 2009-07-03 LAB — COMPREHENSIVE METABOLIC PANEL WITH GFR
ALT: 19 U/L (ref 0–35)
AST: 20 U/L (ref 0–37)
Albumin: 4.2 g/dL (ref 3.5–5.2)
Alkaline Phosphatase: 86 U/L (ref 39–117)
BUN: 12 mg/dL (ref 6–23)
CO2: 24 meq/L (ref 19–32)
Calcium: 9.8 mg/dL (ref 8.4–10.5)
Chloride: 103 meq/L (ref 96–112)
Creatinine, Ser: 0.82 mg/dL (ref 0.40–1.20)
Glucose, Bld: 98 mg/dL (ref 70–99)
Potassium: 4.8 meq/L (ref 3.5–5.3)
Sodium: 139 meq/L (ref 135–145)
Total Bilirubin: 0.3 mg/dL (ref 0.3–1.2)
Total Protein: 6.8 g/dL (ref 6.0–8.3)

## 2009-07-03 LAB — FERRITIN: Ferritin: 3 ng/mL — ABNORMAL LOW (ref 10–291)

## 2009-07-03 LAB — CBC WITH DIFFERENTIAL (CANCER CENTER ONLY)
BASO#: 0 10*3/uL (ref 0.0–0.2)
EOS%: 1.7 % (ref 0.0–7.0)
Eosinophils Absolute: 0.1 10*3/uL (ref 0.0–0.5)
HCT: 37.9 % (ref 34.8–46.6)
HGB: 12.2 g/dL (ref 11.6–15.9)
LYMPH#: 1.5 10*3/uL (ref 0.9–3.3)
LYMPH%: 24.1 % (ref 14.0–48.0)
MCH: 23.3 pg — ABNORMAL LOW (ref 26.0–34.0)
MCHC: 32.1 g/dL (ref 32.0–36.0)
MCV: 72 fL — ABNORMAL LOW (ref 81–101)
MONO%: 6.6 % (ref 0.0–13.0)
NEUT#: 4.2 10*3/uL (ref 1.5–6.5)
NEUT%: 67.1 % (ref 39.6–80.0)
RBC: 5.23 10*6/uL (ref 3.70–5.32)

## 2009-08-01 ENCOUNTER — Encounter: Admission: RE | Admit: 2009-08-01 | Discharge: 2009-08-01 | Payer: Self-pay | Admitting: Obstetrics and Gynecology

## 2009-09-01 ENCOUNTER — Ambulatory Visit: Payer: Self-pay | Admitting: Hematology & Oncology

## 2009-09-04 ENCOUNTER — Encounter: Payer: Self-pay | Admitting: Gastroenterology

## 2009-09-04 LAB — COMPREHENSIVE METABOLIC PANEL
BUN: 11 mg/dL (ref 6–23)
CO2: 21 mEq/L (ref 19–32)
Creatinine, Ser: 0.89 mg/dL (ref 0.40–1.20)
Glucose, Bld: 115 mg/dL — ABNORMAL HIGH (ref 70–99)
Total Bilirubin: 0.4 mg/dL (ref 0.3–1.2)

## 2009-09-04 LAB — CBC WITH DIFFERENTIAL (CANCER CENTER ONLY)
BASO#: 0 10*3/uL (ref 0.0–0.2)
EOS%: 2.4 % (ref 0.0–7.0)
Eosinophils Absolute: 0.1 10*3/uL (ref 0.0–0.5)
HCT: 38.9 % (ref 34.8–46.6)
HGB: 12.7 g/dL (ref 11.6–15.9)
LYMPH#: 2.1 10*3/uL (ref 0.9–3.3)
MCH: 24 pg — ABNORMAL LOW (ref 26.0–34.0)
MCHC: 32.6 g/dL (ref 32.0–36.0)
NEUT%: 55.1 % (ref 39.6–80.0)
RBC: 5.29 10*6/uL (ref 3.70–5.32)

## 2009-09-04 LAB — FERRITIN: Ferritin: 4 ng/mL — ABNORMAL LOW (ref 10–291)

## 2009-10-22 ENCOUNTER — Encounter: Admission: RE | Admit: 2009-10-22 | Discharge: 2009-10-22 | Payer: Self-pay | Admitting: Neurology

## 2009-10-27 ENCOUNTER — Ambulatory Visit: Payer: Self-pay | Admitting: Hematology & Oncology

## 2009-10-30 ENCOUNTER — Encounter: Payer: Self-pay | Admitting: Gastroenterology

## 2009-10-30 LAB — COMPREHENSIVE METABOLIC PANEL
ALT: 25 U/L (ref 0–35)
AST: 22 U/L (ref 0–37)
Calcium: 9.1 mg/dL (ref 8.4–10.5)
Chloride: 105 mEq/L (ref 96–112)
Creatinine, Ser: 0.81 mg/dL (ref 0.40–1.20)
Sodium: 137 mEq/L (ref 135–145)
Total Bilirubin: 0.3 mg/dL (ref 0.3–1.2)

## 2009-10-30 LAB — CBC WITH DIFFERENTIAL (CANCER CENTER ONLY)
BASO#: 0 10*3/uL (ref 0.0–0.2)
BASO%: 0.4 % (ref 0.0–2.0)
HCT: 35.6 % (ref 34.8–46.6)
HGB: 11.6 g/dL (ref 11.6–15.9)
LYMPH#: 2 10*3/uL (ref 0.9–3.3)
MONO#: 0.5 10*3/uL (ref 0.1–0.9)
NEUT#: 3.5 10*3/uL (ref 1.5–6.5)
NEUT%: 57.4 % (ref 39.6–80.0)
WBC: 6.1 10*3/uL (ref 3.9–10.0)

## 2009-12-11 ENCOUNTER — Ambulatory Visit: Payer: Self-pay | Admitting: Hematology & Oncology

## 2009-12-13 ENCOUNTER — Encounter: Payer: Self-pay | Admitting: Gastroenterology

## 2009-12-13 LAB — CBC WITH DIFFERENTIAL (CANCER CENTER ONLY)
Eosinophils Absolute: 0.1 10*3/uL (ref 0.0–0.5)
HCT: 38.8 % (ref 34.8–46.6)
LYMPH%: 32.6 % (ref 14.0–48.0)
MCH: 22.1 pg — ABNORMAL LOW (ref 26.0–34.0)
MCV: 69 fL — ABNORMAL LOW (ref 81–101)
MONO#: 0.5 10*3/uL (ref 0.1–0.9)
MONO%: 6.7 % (ref 0.0–13.0)
NEUT%: 58.2 % (ref 39.6–80.0)
RBC: 5.58 10*6/uL — ABNORMAL HIGH (ref 3.70–5.32)
WBC: 6.7 10*3/uL (ref 3.9–10.0)

## 2009-12-13 LAB — COMPREHENSIVE METABOLIC PANEL
Alkaline Phosphatase: 78 U/L (ref 39–117)
BUN: 14 mg/dL (ref 6–23)
CO2: 25 mEq/L (ref 19–32)
Creatinine, Ser: 0.93 mg/dL (ref 0.40–1.20)
Glucose, Bld: 92 mg/dL (ref 70–99)
Total Bilirubin: 0.4 mg/dL (ref 0.3–1.2)

## 2009-12-13 LAB — FERRITIN: Ferritin: 4 ng/mL — ABNORMAL LOW (ref 10–291)

## 2010-01-15 ENCOUNTER — Ambulatory Visit: Payer: Self-pay | Admitting: Cardiology

## 2010-01-15 ENCOUNTER — Inpatient Hospital Stay (HOSPITAL_COMMUNITY): Admission: EM | Admit: 2010-01-15 | Discharge: 2010-01-16 | Payer: Self-pay | Admitting: Emergency Medicine

## 2010-01-30 ENCOUNTER — Ambulatory Visit: Payer: Self-pay | Admitting: Hematology & Oncology

## 2010-01-31 ENCOUNTER — Encounter: Payer: Self-pay | Admitting: Gastroenterology

## 2010-01-31 LAB — CBC WITH DIFFERENTIAL (CANCER CENTER ONLY)
BASO%: 0.4 % (ref 0.0–2.0)
Eosinophils Absolute: 0.1 10*3/uL (ref 0.0–0.5)
HCT: 38.2 % (ref 34.8–46.6)
LYMPH#: 2.2 10*3/uL (ref 0.9–3.3)
LYMPH%: 32.6 % (ref 14.0–48.0)
MCV: 70 fL — ABNORMAL LOW (ref 81–101)
MONO#: 0.4 10*3/uL (ref 0.1–0.9)
NEUT%: 59.7 % (ref 39.6–80.0)
Platelets: 307 10*3/uL (ref 145–400)
RBC: 5.45 10*6/uL — ABNORMAL HIGH (ref 3.70–5.32)
WBC: 6.6 10*3/uL (ref 3.9–10.0)

## 2010-03-08 ENCOUNTER — Encounter: Admission: RE | Admit: 2010-03-08 | Discharge: 2010-03-08 | Payer: Self-pay | Admitting: Family Medicine

## 2010-04-03 ENCOUNTER — Ambulatory Visit: Payer: Self-pay | Admitting: Hematology & Oncology

## 2010-04-04 ENCOUNTER — Encounter: Payer: Self-pay | Admitting: Gastroenterology

## 2010-04-04 LAB — CBC WITH DIFFERENTIAL (CANCER CENTER ONLY)
BASO%: 0.3 % (ref 0.0–2.0)
EOS%: 2.1 % (ref 0.0–7.0)
HCT: 39.1 % (ref 34.8–46.6)
LYMPH%: 25.9 % (ref 14.0–48.0)
MCH: 22.4 pg — ABNORMAL LOW (ref 26.0–34.0)
MCHC: 31.5 g/dL — ABNORMAL LOW (ref 32.0–36.0)
MCV: 71 fL — ABNORMAL LOW (ref 81–101)
MONO%: 5.5 % (ref 0.0–13.0)
NEUT%: 66.2 % (ref 39.6–80.0)
RDW: 15.8 % — ABNORMAL HIGH (ref 10.5–14.6)

## 2010-04-06 LAB — COMPREHENSIVE METABOLIC PANEL
ALT: 24 U/L (ref 0–35)
AST: 24 U/L (ref 0–37)
BUN: 13 mg/dL (ref 6–23)
Creatinine, Ser: 0.85 mg/dL (ref 0.40–1.20)
Total Bilirubin: 0.5 mg/dL (ref 0.3–1.2)

## 2010-04-06 LAB — JAK2 GENOTYPR: JAK2 GenotypR: NOT DETECTED

## 2010-06-05 ENCOUNTER — Ambulatory Visit: Payer: Self-pay | Admitting: Hematology & Oncology

## 2010-06-06 ENCOUNTER — Encounter: Payer: Self-pay | Admitting: Gastroenterology

## 2010-06-06 LAB — CBC WITH DIFFERENTIAL (CANCER CENTER ONLY)
BASO#: 0 10*3/uL (ref 0.0–0.2)
EOS%: 1.2 % (ref 0.0–7.0)
Eosinophils Absolute: 0.1 10*3/uL (ref 0.0–0.5)
HGB: 11.7 g/dL (ref 11.6–15.9)
LYMPH#: 1.8 10*3/uL (ref 0.9–3.3)
MCHC: 31.1 g/dL — ABNORMAL LOW (ref 32.0–36.0)
NEUT#: 5.1 10*3/uL (ref 1.5–6.5)
RBC: 5.64 10*6/uL — ABNORMAL HIGH (ref 3.70–5.32)

## 2010-06-06 LAB — TECHNOLOGIST REVIEW CHCC SATELLITE

## 2010-06-06 LAB — COMPREHENSIVE METABOLIC PANEL
ALT: 19 U/L (ref 0–35)
AST: 23 U/L (ref 0–37)
CO2: 22 mEq/L (ref 19–32)
Sodium: 138 mEq/L (ref 135–145)
Total Bilirubin: 0.5 mg/dL (ref 0.3–1.2)
Total Protein: 7.1 g/dL (ref 6.0–8.3)

## 2010-06-06 LAB — FERRITIN: Ferritin: 3 ng/mL — ABNORMAL LOW (ref 10–291)

## 2010-08-03 ENCOUNTER — Encounter: Admission: RE | Admit: 2010-08-03 | Discharge: 2010-08-03 | Payer: Self-pay | Admitting: Obstetrics and Gynecology

## 2010-08-07 ENCOUNTER — Ambulatory Visit: Payer: Self-pay | Admitting: Hematology & Oncology

## 2010-08-09 ENCOUNTER — Encounter: Payer: Self-pay | Admitting: Gastroenterology

## 2010-08-09 LAB — CBC WITH DIFFERENTIAL (CANCER CENTER ONLY)
Eosinophils Absolute: 0.1 10*3/uL (ref 0.0–0.5)
HCT: 33.1 % — ABNORMAL LOW (ref 34.8–46.6)
LYMPH%: 31.9 % (ref 14.0–48.0)
MCV: 65 fL — ABNORMAL LOW (ref 81–101)
MONO#: 0.4 10*3/uL (ref 0.1–0.9)
NEUT%: 58.4 % (ref 39.6–80.0)
RBC: 5.09 10*6/uL (ref 3.70–5.32)
WBC: 5 10*3/uL (ref 3.9–10.0)

## 2010-08-09 LAB — COMPREHENSIVE METABOLIC PANEL
BUN: 15 mg/dL (ref 6–23)
CO2: 25 mEq/L (ref 19–32)
Calcium: 9.1 mg/dL (ref 8.4–10.5)
Chloride: 105 mEq/L (ref 96–112)
Creatinine, Ser: 0.87 mg/dL (ref 0.40–1.20)
Glucose, Bld: 99 mg/dL (ref 70–99)

## 2010-08-09 LAB — FERRITIN: Ferritin: 4 ng/mL — ABNORMAL LOW (ref 10–291)

## 2010-09-05 ENCOUNTER — Encounter: Payer: Self-pay | Admitting: Gastroenterology

## 2010-10-16 ENCOUNTER — Ambulatory Visit: Payer: Self-pay | Admitting: Hematology & Oncology

## 2010-10-17 ENCOUNTER — Encounter: Payer: Self-pay | Admitting: Gastroenterology

## 2010-10-17 LAB — COMPREHENSIVE METABOLIC PANEL
ALT: 29 U/L (ref 0–35)
AST: 33 U/L (ref 0–37)
Albumin: 4.5 g/dL (ref 3.5–5.2)
Alkaline Phosphatase: 82 U/L (ref 39–117)
Calcium: 9.9 mg/dL (ref 8.4–10.5)
Chloride: 103 mEq/L (ref 96–112)
Potassium: 4.3 mEq/L (ref 3.5–5.3)
Sodium: 137 mEq/L (ref 135–145)
Total Protein: 7.2 g/dL (ref 6.0–8.3)

## 2010-10-17 LAB — RETICULOCYTES (CHCC)
ABS Retic: 70.7 10*3/uL (ref 19.0–186.0)
RBC.: 5.89 MIL/uL — ABNORMAL HIGH (ref 3.87–5.11)
Retic Ct Pct: 1.2 % (ref 0.4–3.1)

## 2010-10-17 LAB — CBC WITH DIFFERENTIAL (CANCER CENTER ONLY)
BASO#: 0 10*3/uL (ref 0.0–0.2)
BASO%: 0.4 % (ref 0.0–2.0)
HCT: 39.6 % (ref 34.8–46.6)
HGB: 12.4 g/dL (ref 11.6–15.9)
LYMPH#: 1.5 10*3/uL (ref 0.9–3.3)
LYMPH%: 21.4 % (ref 14.0–48.0)
MCV: 66 fL — ABNORMAL LOW (ref 81–101)
MONO#: 0.5 10*3/uL (ref 0.1–0.9)
NEUT%: 70.3 % (ref 39.6–80.0)
RDW: 15.8 % — ABNORMAL HIGH (ref 10.5–14.6)
WBC: 7.2 10*3/uL (ref 3.9–10.0)

## 2010-12-12 ENCOUNTER — Ambulatory Visit (HOSPITAL_BASED_OUTPATIENT_CLINIC_OR_DEPARTMENT_OTHER): Payer: Medicare Other | Admitting: Hematology & Oncology

## 2010-12-13 ENCOUNTER — Encounter: Payer: Self-pay | Admitting: Gastroenterology

## 2010-12-13 LAB — COMPREHENSIVE METABOLIC PANEL
ALT: 18 U/L (ref 0–35)
AST: 20 U/L (ref 0–37)
Albumin: 4.1 g/dL (ref 3.5–5.2)
Alkaline Phosphatase: 69 U/L (ref 39–117)
BUN: 13 mg/dL (ref 6–23)
CO2: 24 mEq/L (ref 19–32)
Calcium: 9 mg/dL (ref 8.4–10.5)
Chloride: 104 mEq/L (ref 96–112)
Creatinine, Ser: 0.88 mg/dL (ref 0.40–1.20)
Glucose, Bld: 106 mg/dL — ABNORMAL HIGH (ref 70–99)
Potassium: 4.2 mEq/L (ref 3.5–5.3)
Sodium: 139 mEq/L (ref 135–145)
Total Bilirubin: 0.3 mg/dL (ref 0.3–1.2)
Total Protein: 6.4 g/dL (ref 6.0–8.3)

## 2010-12-13 LAB — CBC WITH DIFFERENTIAL (CANCER CENTER ONLY)
BASO#: 0 10*3/uL (ref 0.0–0.2)
BASO%: 0.3 % (ref 0.0–2.0)
EOS%: 2.2 % (ref 0.0–7.0)
Eosinophils Absolute: 0.1 10*3/uL (ref 0.0–0.5)
HCT: 30.8 % — ABNORMAL LOW (ref 34.8–46.6)
HGB: 9.7 g/dL — ABNORMAL LOW (ref 11.6–15.9)
LYMPH#: 1.4 10*3/uL (ref 0.9–3.3)
LYMPH%: 24.1 % (ref 14.0–48.0)
MCH: 20.3 pg — ABNORMAL LOW (ref 26.0–34.0)
MCHC: 31.6 g/dL — ABNORMAL LOW (ref 32.0–36.0)
MCV: 64 fL — ABNORMAL LOW (ref 81–101)
MONO#: 0.5 10*3/uL (ref 0.1–0.9)
MONO%: 8.5 % (ref 0.0–13.0)
NEUT#: 3.9 10*3/uL (ref 1.5–6.5)
NEUT%: 64.9 % (ref 39.6–80.0)
Platelets: 299 10*3/uL (ref 145–400)
RBC: 4.79 10*6/uL (ref 3.70–5.32)
RDW: 15.5 % — ABNORMAL HIGH (ref 10.5–14.6)
WBC: 6 10*3/uL (ref 3.9–10.0)

## 2010-12-13 LAB — FERRITIN: Ferritin: 2 ng/mL — ABNORMAL LOW (ref 10–291)

## 2010-12-20 ENCOUNTER — Other Ambulatory Visit: Payer: Self-pay | Admitting: Obstetrics and Gynecology

## 2010-12-22 ENCOUNTER — Encounter: Payer: Self-pay | Admitting: Gastroenterology

## 2010-12-23 ENCOUNTER — Encounter: Payer: Self-pay | Admitting: Obstetrics and Gynecology

## 2010-12-30 LAB — CONVERTED CEMR LAB: Pap Smear: ABNORMAL

## 2011-01-01 ENCOUNTER — Encounter
Admission: RE | Admit: 2011-01-01 | Discharge: 2011-01-01 | Payer: Self-pay | Source: Home / Self Care | Attending: Neurology | Admitting: Neurology

## 2011-01-02 ENCOUNTER — Encounter (HOSPITAL_BASED_OUTPATIENT_CLINIC_OR_DEPARTMENT_OTHER): Payer: Medicare Other | Admitting: Hematology & Oncology

## 2011-01-02 DIAGNOSIS — Z7902 Long term (current) use of antithrombotics/antiplatelets: Secondary | ICD-10-CM

## 2011-01-02 DIAGNOSIS — D45 Polycythemia vera: Secondary | ICD-10-CM

## 2011-01-02 DIAGNOSIS — L299 Pruritus, unspecified: Secondary | ICD-10-CM

## 2011-01-02 DIAGNOSIS — R42 Dizziness and giddiness: Secondary | ICD-10-CM

## 2011-01-02 LAB — RETICULOCYTES (CHCC): Retic Ct Pct: 1 % (ref 0.4–3.1)

## 2011-01-02 LAB — CBC WITH DIFFERENTIAL (CANCER CENTER ONLY)
BASO#: 0 10*3/uL (ref 0.0–0.2)
BASO%: 0.3 % (ref 0.0–2.0)
Eosinophils Absolute: 0.1 10*3/uL (ref 0.0–0.5)
HCT: 32.6 % — ABNORMAL LOW (ref 34.8–46.6)
HGB: 10.2 g/dL — ABNORMAL LOW (ref 11.6–15.9)
LYMPH#: 1.7 10*3/uL (ref 0.9–3.3)
MONO#: 0.5 10*3/uL (ref 0.1–0.9)
NEUT%: 58.8 % (ref 39.6–80.0)
RBC: 5.16 10*6/uL (ref 3.70–5.32)
RDW: 15.8 % — ABNORMAL HIGH (ref 10.5–14.6)
WBC: 5.6 10*3/uL (ref 3.9–10.0)

## 2011-01-02 LAB — COMPREHENSIVE METABOLIC PANEL
ALT: 15 U/L (ref 0–35)
AST: 18 U/L (ref 0–37)
Albumin: 4.1 g/dL (ref 3.5–5.2)
BUN: 13 mg/dL (ref 6–23)
CO2: 23 mEq/L (ref 19–32)
Calcium: 9.3 mg/dL (ref 8.4–10.5)
Chloride: 103 mEq/L (ref 96–112)
Potassium: 4.2 mEq/L (ref 3.5–5.3)

## 2011-01-03 NOTE — Letter (Signed)
[facility]   Imported By: Lennie Odor 04/19/2010 17:08:21  _____________________________________________________________________  External Attachment:    Type:   Image     Comment:   External Document

## 2011-01-03 NOTE — Letter (Signed)
[facility]   Imported By: Sherian Rein 03/20/2010 07:27:04  _____________________________________________________________________  External Attachment:    Type:   Image     Comment:   External Document

## 2011-01-03 NOTE — Letter (Signed)
[facility]   Imported By: Sherian Rein 07/02/2010 14:16:05  _____________________________________________________________________  External Attachment:    Type:   Image     Comment:   External Document

## 2011-01-03 NOTE — Letter (Signed)
Summary: [facility]  [facility]   Imported By: Lester Brantley 08/29/2010 11:25:12  _____________________________________________________________________  External Attachment:    Type:   Image     Comment:   External Document

## 2011-01-03 NOTE — Letter (Signed)
[facility]   Imported By: Sherian Rein 02/23/2010 08:18:11  _____________________________________________________________________  External Attachment:    Type:   Image     Comment:   External Document

## 2011-01-03 NOTE — Letter (Signed)
[facility]   Imported By: Lennie Odor 10/26/2010 14:39:46  _____________________________________________________________________  External Attachment:    Type:   Image     Comment:   External Document

## 2011-01-03 NOTE — Letter (Signed)
Summary: [facility]  [facility]   Imported By: Sherian Rein 10/08/2010 08:33:47  _____________________________________________________________________  External Attachment:    Type:   Image     Comment:   External Document

## 2011-01-03 NOTE — Letter (Signed)
[facility]   Imported By: Sherian Rein 01/11/2010 11:59:49  _____________________________________________________________________  External Attachment:    Type:   Image     Comment:   External Document

## 2011-02-07 NOTE — Letter (Signed)
Summary: [facility]  [facility]   Imported By: Sherian Rein 01/29/2011 07:47:48  _____________________________________________________________________  External Attachment:    Type:   Image     Comment:   External Document

## 2011-02-21 LAB — COMPREHENSIVE METABOLIC PANEL
ALT: 30 U/L (ref 0–35)
AST: 29 U/L (ref 0–37)
CO2: 27 mEq/L (ref 19–32)
Calcium: 8.9 mg/dL (ref 8.4–10.5)
Chloride: 105 mEq/L (ref 96–112)
Creatinine, Ser: 0.74 mg/dL (ref 0.4–1.2)
GFR calc Af Amer: >60 mL/min (ref >60)
GFR calc non Af Amer: >60 mL/min (ref >60)
Glucose, Bld: 94 mg/dL (ref 70–99)
Total Bilirubin: 0.5 mg/dL (ref 0.3–1.2)

## 2011-02-21 LAB — POCT CARDIAC MARKERS
CKMB, poc: <1 ng/mL — ABNORMAL LOW (ref 1.0–8.0)
CKMB, poc: <1 ng/mL — ABNORMAL LOW (ref 1.0–8.0)
Myoglobin, poc: 50.6 ng/mL (ref 12–200)
Myoglobin, poc: 51.4 ng/mL (ref 12–200)
Troponin i, poc: <0.05 ng/mL (ref 0.00–0.09)
Troponin i, poc: <0.05 ng/mL (ref 0.00–0.09)

## 2011-02-21 LAB — COMPREHENSIVE METABOLIC PANEL WITH GFR
Albumin: 3.9 g/dL (ref 3.5–5.2)
Alkaline Phosphatase: 78 U/L (ref 39–117)
BUN: 9 mg/dL (ref 6–23)
Potassium: 3.8 meq/L (ref 3.5–5.1)
Sodium: 137 meq/L (ref 135–145)
Total Protein: 6.8 g/dL (ref 6.0–8.3)

## 2011-02-21 LAB — CARDIAC PANEL(CRET KIN+CKTOT+MB+TROPI)
CK, MB: 0.9 ng/mL (ref 0.3–4.0)
Total CK: 58 U/L (ref 7–177)

## 2011-02-21 LAB — CBC
HCT: 37.2 % (ref 36.0–46.0)
HCT: 38.4 % (ref 36.0–46.0)
Hemoglobin: 12 g/dL (ref 12.0–15.0)
Hemoglobin: 12.5 g/dL (ref 12.0–15.0)
MCHC: 32.5 g/dL (ref 30.0–36.0)
MCV: 70.5 fL — ABNORMAL LOW (ref 78.0–100.0)
MCV: 71.1 fL — ABNORMAL LOW (ref 78.0–100.0)
Platelets: 288 K/uL (ref 150–400)
RBC: 5.45 MIL/uL — ABNORMAL HIGH (ref 3.87–5.11)
RDW: 17.6 % — ABNORMAL HIGH (ref 11.5–15.5)
WBC: 6.2 10*3/uL (ref 4.0–10.5)
WBC: 6.8 10*3/uL (ref 4.0–10.5)

## 2011-02-21 LAB — BASIC METABOLIC PANEL
CO2: 29 mEq/L (ref 19–32)
Chloride: 109 mEq/L (ref 96–112)
Glucose, Bld: 107 mg/dL — ABNORMAL HIGH (ref 70–99)
Potassium: 4.2 mEq/L (ref 3.5–5.1)
Sodium: 143 mEq/L (ref 135–145)

## 2011-02-21 LAB — DIFFERENTIAL
Basophils Absolute: 0 10*3/uL (ref 0.0–0.1)
Basophils Relative: 1 % (ref 0–1)
Eosinophils Absolute: 0.1 10*3/uL (ref 0.0–0.7)
Eosinophils Relative: 1 % (ref 0–5)
Lymphocytes Relative: 25 % (ref 12–46)
Lymphs Abs: 1.6 K/uL (ref 0.7–4.0)
Monocytes Absolute: 0.6 K/uL (ref 0.1–1.0)
Monocytes Relative: 10 % (ref 3–12)
Neutro Abs: 3.9 K/uL (ref 1.7–7.7)
Neutrophils Relative %: 63 % (ref 43–77)

## 2011-02-21 LAB — CK TOTAL AND CKMB (NOT AT ARMC)
CK, MB: 0.9 ng/mL (ref 0.3–4.0)
Relative Index: INVALID (ref 0.0–2.5)
Total CK: 42 U/L (ref 7–177)

## 2011-02-21 LAB — TROPONIN I: Troponin I: 0.02 ng/mL (ref 0.00–0.06)

## 2011-02-21 LAB — BRAIN NATRIURETIC PEPTIDE: Pro B Natriuretic peptide (BNP): 53 pg/mL (ref 0.0–100.0)

## 2011-02-21 LAB — LIPID PANEL
HDL: 59 mg/dL (ref >39)
Triglycerides: 120 mg/dL (ref <150)
VLDL: 24 mg/dL (ref 0–40)

## 2011-02-21 LAB — HEMOGLOBIN A1C
Hgb A1c MFr Bld: 6.3 % — ABNORMAL HIGH (ref 4.6–6.1)
Mean Plasma Glucose: 134 mg/dL

## 2011-02-21 LAB — TSH: TSH: 0.641 u[IU]/mL (ref 0.350–4.500)

## 2011-03-20 ENCOUNTER — Other Ambulatory Visit: Payer: Self-pay | Admitting: Hematology & Oncology

## 2011-03-20 ENCOUNTER — Encounter (HOSPITAL_BASED_OUTPATIENT_CLINIC_OR_DEPARTMENT_OTHER): Payer: Medicare Other | Admitting: Hematology & Oncology

## 2011-03-20 DIAGNOSIS — D45 Polycythemia vera: Secondary | ICD-10-CM

## 2011-03-20 DIAGNOSIS — L299 Pruritus, unspecified: Secondary | ICD-10-CM

## 2011-03-20 DIAGNOSIS — R42 Dizziness and giddiness: Secondary | ICD-10-CM

## 2011-03-20 DIAGNOSIS — Z7902 Long term (current) use of antithrombotics/antiplatelets: Secondary | ICD-10-CM

## 2011-03-20 DIAGNOSIS — R1110 Vomiting, unspecified: Secondary | ICD-10-CM

## 2011-03-20 LAB — CBC WITH DIFFERENTIAL (CANCER CENTER ONLY)
BASO%: 0.4 % (ref 0.0–2.0)
Eosinophils Absolute: 0.1 10*3/uL (ref 0.0–0.5)
LYMPH#: 1.4 10*3/uL (ref 0.9–3.3)
MONO#: 0.5 10*3/uL (ref 0.1–0.9)
NEUT#: 4.8 10*3/uL (ref 1.5–6.5)
Platelets: 313 10*3/uL (ref 145–400)
RBC: 5.33 10*6/uL — ABNORMAL HIGH (ref 3.70–5.32)
RDW: 20.1 % — ABNORMAL HIGH (ref 11.1–15.7)
WBC: 6.9 10*3/uL (ref 3.9–10.0)

## 2011-03-20 LAB — FERRITIN: Ferritin: 3 ng/mL — ABNORMAL LOW (ref 10–291)

## 2011-03-20 LAB — RETICULOCYTES (CHCC)
ABS Retic: 54.2 10*3/uL (ref 19.0–186.0)
RBC.: 5.42 MIL/uL — ABNORMAL HIGH (ref 3.87–5.11)
Retic Ct Pct: 1 % (ref 0.4–3.1)

## 2011-03-20 LAB — TSH: TSH: 1.523 u[IU]/mL (ref 0.350–4.500)

## 2011-04-17 ENCOUNTER — Encounter (HOSPITAL_BASED_OUTPATIENT_CLINIC_OR_DEPARTMENT_OTHER): Payer: Medicare Other | Admitting: Hematology & Oncology

## 2011-04-17 ENCOUNTER — Other Ambulatory Visit: Payer: Self-pay | Admitting: Hematology & Oncology

## 2011-04-17 DIAGNOSIS — L299 Pruritus, unspecified: Secondary | ICD-10-CM

## 2011-04-17 DIAGNOSIS — Z7902 Long term (current) use of antithrombotics/antiplatelets: Secondary | ICD-10-CM

## 2011-04-17 DIAGNOSIS — D45 Polycythemia vera: Secondary | ICD-10-CM

## 2011-04-17 DIAGNOSIS — R42 Dizziness and giddiness: Secondary | ICD-10-CM

## 2011-04-17 LAB — CBC WITH DIFFERENTIAL (CANCER CENTER ONLY)
BASO#: 0 10*3/uL (ref 0.0–0.2)
BASO%: 0.4 % (ref 0.0–2.0)
EOS%: 2.2 % (ref 0.0–7.0)
HGB: 10.4 g/dL — ABNORMAL LOW (ref 11.6–15.9)
MCH: 20.8 pg — ABNORMAL LOW (ref 26.0–34.0)
MCHC: 30.9 g/dL — ABNORMAL LOW (ref 32.0–36.0)
MONO%: 8.8 % (ref 0.0–13.0)
NEUT#: 4.3 10*3/uL (ref 1.5–6.5)
RDW: 19.4 % — ABNORMAL HIGH (ref 11.1–15.7)

## 2011-04-17 LAB — RETICULOCYTES (CHCC): Retic Ct Pct: 1 % (ref 0.4–3.1)

## 2011-04-19 NOTE — Op Note (Signed)
NAMESONYIA, MURO                ACCOUNT NO.:  [id number]   MEDICAL RECORD NO.:  [id number]           PATIENT TYPE:   LOCATION:                                 FACILITY:   PHYSICIAN:  Rose Phi. Myna Hidalgo, M.D.      DATE OF BIRTH:   DATE OF PROCEDURE:  04/05/2006  DATE OF DISCHARGE:                                 OPERATIVE REPORT   PROCEDURE:  Left posterior iliac crest bone marrow biopsy and aspirate.   ATTENDING:  Rose Phi. Myna Hidalgo, M.D.   DESCRIPTION OF PROCEDURE:  Ms. Belnap was brought to the short-stay unit.  She had an IV placed into her right hand.  She was then placed onto her  right side.   The left posterior iliac crest region was prepped and draped in sterile  fashion.  She received 5 mg of Versed and 50 mg of Demerol for IV sedation.   Ten milliliters of 2+ lidocaine were infiltrated under the skin down to the  periosteum.   A #11 scalpel was used to make an incision into the skin.  Two bone marrow  aspirates were obtained without difficulty.   A second incision was made into the skin with a #11 scalpel.  A good bone  marrow biopsy core was obtained without difficulty.   The patient tolerated the procedure well.  There were no complications.      Rose Phi. Myna Hidalgo, M.D.  Electronically Signed     PRE/MEDQ  D:  06/13/2006  T:  06/14/2006  Job:  [id number]

## 2011-05-21 ENCOUNTER — Encounter: Payer: Self-pay | Admitting: Family Medicine

## 2011-05-30 ENCOUNTER — Other Ambulatory Visit: Payer: Self-pay | Admitting: Hematology & Oncology

## 2011-05-30 ENCOUNTER — Encounter (HOSPITAL_BASED_OUTPATIENT_CLINIC_OR_DEPARTMENT_OTHER): Payer: Medicare Other | Admitting: Hematology & Oncology

## 2011-05-30 DIAGNOSIS — R1110 Vomiting, unspecified: Secondary | ICD-10-CM

## 2011-05-30 DIAGNOSIS — J189 Pneumonia, unspecified organism: Secondary | ICD-10-CM

## 2011-05-30 DIAGNOSIS — D45 Polycythemia vera: Secondary | ICD-10-CM

## 2011-05-30 LAB — IRON AND TIBC
Iron: 21 ug/dL — ABNORMAL LOW (ref 42–145)
UIBC: >450 ug/dL

## 2011-05-30 LAB — RETICULOCYTES (CHCC): ABS Retic: 77.3 10*3/uL (ref 19.0–186.0)

## 2011-05-30 LAB — CBC WITH DIFFERENTIAL (CANCER CENTER ONLY)
BASO%: 0.6 % (ref 0.0–2.0)
HCT: 33.7 % — ABNORMAL LOW (ref 34.8–46.6)
LYMPH#: 2.2 10*3/uL (ref 0.9–3.3)
MONO#: 0.6 10*3/uL (ref 0.1–0.9)
Platelets: 349 10*3/uL (ref 145–400)
RBC: 4.99 10*6/uL (ref 3.70–5.32)
RDW: 18.3 % — ABNORMAL HIGH (ref 11.1–15.7)
WBC: 7.1 10*3/uL (ref 3.9–10.0)

## 2011-05-30 LAB — FERRITIN: Ferritin: 6 ng/mL — ABNORMAL LOW (ref 10–291)

## 2011-05-30 LAB — CHCC SATELLITE - SMEAR

## 2011-06-25 ENCOUNTER — Encounter: Payer: Self-pay | Admitting: Internal Medicine

## 2011-06-28 ENCOUNTER — Institutional Professional Consult (permissible substitution): Payer: Medicare Other | Admitting: Internal Medicine

## 2011-07-11 ENCOUNTER — Other Ambulatory Visit: Payer: Self-pay | Admitting: Hematology & Oncology

## 2011-07-11 ENCOUNTER — Encounter (HOSPITAL_BASED_OUTPATIENT_CLINIC_OR_DEPARTMENT_OTHER): Payer: Medicare Other | Admitting: Hematology & Oncology

## 2011-07-11 DIAGNOSIS — J189 Pneumonia, unspecified organism: Secondary | ICD-10-CM

## 2011-07-11 DIAGNOSIS — D45 Polycythemia vera: Secondary | ICD-10-CM

## 2011-07-11 DIAGNOSIS — R1110 Vomiting, unspecified: Secondary | ICD-10-CM

## 2011-07-11 LAB — CBC WITH DIFFERENTIAL (CANCER CENTER ONLY)
Eosinophils Absolute: 0.2 10*3/uL (ref 0.0–0.5)
MONO#: 0.6 10*3/uL (ref 0.1–0.9)
MONO%: 10.7 % (ref 0.0–13.0)
NEUT#: 2.7 10*3/uL (ref 1.5–6.5)
Platelets: 289 10*3/uL (ref 145–400)
RBC: 4.88 10*6/uL (ref 3.70–5.32)
WBC: 5.3 10*3/uL (ref 3.9–10.0)

## 2011-07-11 LAB — LACTATE DEHYDROGENASE: LDH: 200 U/L (ref 94–250)

## 2011-07-11 LAB — IRON AND TIBC
%SAT: 5 % — ABNORMAL LOW (ref 20–55)
Iron: 25 ug/dL — ABNORMAL LOW (ref 42–145)
TIBC: 474 ug/dL — ABNORMAL HIGH (ref 250–470)
UIBC: 449 ug/dL

## 2011-07-11 LAB — RETICULOCYTES (CHCC): Retic Ct Pct: 1.2 % (ref 0.4–2.3)

## 2011-07-11 LAB — COMPREHENSIVE METABOLIC PANEL
Alkaline Phosphatase: 73 U/L (ref 39–117)
CO2: 24 mEq/L (ref 19–32)
Creatinine, Ser: 0.81 mg/dL (ref 0.50–1.10)
Glucose, Bld: 92 mg/dL (ref 70–99)
Total Bilirubin: 0.3 mg/dL (ref 0.3–1.2)

## 2011-07-11 LAB — CHCC SATELLITE - SMEAR

## 2011-07-11 LAB — FERRITIN: Ferritin: 4 ng/mL — ABNORMAL LOW (ref 10–291)

## 2011-07-15 ENCOUNTER — Other Ambulatory Visit (HOSPITAL_COMMUNITY): Payer: Self-pay | Admitting: Obstetrics & Gynecology

## 2011-07-15 DIAGNOSIS — Z139 Encounter for screening, unspecified: Secondary | ICD-10-CM

## 2011-07-22 ENCOUNTER — Encounter: Payer: Self-pay | Admitting: Internal Medicine

## 2011-07-22 ENCOUNTER — Ambulatory Visit (INDEPENDENT_AMBULATORY_CARE_PROVIDER_SITE_OTHER): Payer: Medicare Other | Admitting: Internal Medicine

## 2011-07-22 ENCOUNTER — Other Ambulatory Visit (HOSPITAL_COMMUNITY): Payer: Self-pay | Admitting: Internal Medicine

## 2011-07-22 VITALS — BP 122/72 | HR 82 | Ht 67.0 in | Wt 211.0 lb

## 2011-07-22 DIAGNOSIS — J45909 Unspecified asthma, uncomplicated: Secondary | ICD-10-CM

## 2011-07-22 DIAGNOSIS — K219 Gastro-esophageal reflux disease without esophagitis: Secondary | ICD-10-CM

## 2011-07-22 MED ORDER — NEBULIZER DEVI: Status: AC

## 2011-07-22 MED ORDER — ALBUTEROL SULFATE (2.5 MG/3ML) 0.083% IN NEBU: 2.5000 mg | INHALATION_SOLUTION | Freq: Four times a day (QID) | RESPIRATORY_TRACT | Status: DC | PRN

## 2011-07-22 NOTE — Assessment & Plan Note (Signed)
I am concerned that much of her airway problem is laryngeal and may relate to her stroke. There may be some paralysis of the vocal cord and at least intermittent aspiration which would explain cough, chest congestion and repeat episodes of bronchitis. She found a nebulizer machine helpful and we can provide that. Plan schedule PFT and screen for alpha-1 antitrypsin deficiency. Swallowing evaluation with a modified barium swallow and speech therapy.

## 2011-07-22 NOTE — Progress Notes (Signed)
Subjective:    Patient ID: Natalie Hendrix, female    DOB: 14-Nov-1951, 60 y.o.   MRN: [id number]  HPI 07/22/11- 59 yoF here on kind referral by Dr. Hinda [place] because of asthma and history of pneumonia with cough. Husband here. She was a patient of mine in the 1990s at the old practice. She gives a history of asthma and bronchitis. Upper respiratory problems have been more evident since November of 2011. Coughs easily while eating. Coughs each morning bringing up small pellets of green. Wakes nauseated in the morning until she coughs her chest clear. Nebulizer treatments at her doctors for this has seemed very helpful and she would like to have a machine at home. Wears a mask outside and finds she is much bothered by strong odors heat and humidity. She is using Qvar 40 twice daily but blames that for burning in her mouth despite aggressive anti-thrush management. Using Beconase AQ nasal spray, a rescue inhaler once or twice daily and Tessalon pearls.  She feels distinctly better indoors where her husband has installed an extensive air filter system as well as individual room air cleaners in several rooms. She has encasings on bending and they are careful about dust control. Allergy skin testing in the past was positive without allergy vaccine. She has a history of polycythemia vera followed by Dr. Myna Hidalgo with phlebotomy. Polycythemia is planned for cerebrovascular accident which has left her with cognitive and speech difficulties. She also has history of irregular heartbeat on Toprol. Pneumonia in 1978. No history of deep vein thrombosis or pulmonary embolism. Pneumococcal vaccine twice. Flu vaccine last year with blamed recurrent episodes of bronchitis during the winter. Thoracic steal syndrome was addressed with vascular reconstruction on the right in the 1980s. Chest x-ray report from 06/12/2011 described minimal right basilar atelectasis with no acute infiltrate identified, improved from  earlier film. Never smoked. Family history a sister with severe allergy problems, and with COPD, father worked with asbestos exposure and was a heavy smoker   Review of Systems See HPI Constitutional:   No-   weight loss, night sweats, fevers, chills, fatigue, lassitude. HEENT:   frequent  headaches,  Some difficulty swallowing,, sore throat,       No-  sneezing, itching, ear ache, nasal congestion, post nasal drip,  CV:  No-   chest pain, orthopnea, PND, swelling in lower extremities, anasarca, dizziness, palpitations Resp: No-   shortness of breath with exertion or at rest.                No-  coughing up of blood.              No-   change in color of mucus.  No- wheezing.   Skin: No-   rash or lesions. GI:  No-   heartburn, indigestion, abdominal pain, nausea, vomiting, diarrhea,                 change in bowel habits, loss of appetite GU: No-   dysuria, change in color of urine, no urgency or frequency.  No- flank pain. MS:  No-   joint pain or swelling.  No- decreased range of motion.  No- back pain. Neuro- grossly normal to observation, Or:  Psych:  No- change in mood or affect. No depression or anxiety.  No memory loss.      Objective:   Physical Exam General- Alert, Oriented, Affect-appropriate, Distress- none acute,  overweight Skin- rash-none, lesions- none, excoriation- none Lymphadenopathy- none  Head- atraumatic            Eyes- Gross vision intact, PERRLA, conjunctivae clear secretions            Ears- Hearing, canals            Nose- Clear, Septal dev, mucus, polyps, erosion, perforation             Throat- Mallampati III , mucosa clear , drainage- none, tonsils- atrophic    Speech is dysphonic, hoarse, hesitant Neck- flexible , trachea midline, no stridor , thyroid nl, carotid no bruit Chest - symmetrical excursion , unlabored           Heart/CV- RRR , no murmur , no gallop  , no rub, nl s1 s2                           - JVD- none , edema- none, stasis changes-  none, varices- none           Lung- clear to P&A, wheeze- none, cough- none , dullness-none, rub- none           Chest wall- Vascular surgery scar Right Upper Anterior chest Abd- tender-no, distended-no, bowel sounds-present, HSM- no Br/ Gen/ Rectal- Not done, not indicated Extrem- cyanosis- none, clubbing, none, atrophy- none, strength- nl Neuro- grossly intact to observation         Assessment & Plan:

## 2011-07-22 NOTE — Patient Instructions (Addendum)
Order  - 1) Screen a1AT      2) Schedule modified barium swallow- speech therapy assisted    3)  Schedule PFT     4) schedule overnight oximetry on room air      5)- Script- nebulizer machine     6 - Albuterol neb solution 0.8/56ml   1 neb every 6 hours if needed     7- Try using the Qvar 40 , 2 puff and rinse mouth, just once daily- before a meal

## 2011-07-23 ENCOUNTER — Telehealth: Payer: Self-pay | Admitting: Internal Medicine

## 2011-07-23 NOTE — Telephone Encounter (Signed)
Pt forgot to tell CDY at OV yesterday that she has been coughing for a few days and the phlegm is now a yellow/green color. She already has a prescription from her PCP for Augmentin 875 mg to take bid for 10 days and she wants to know if CDY thinks she should begin this medication. Pls advise. Allergies  Allergen Reactions  . Chlorzoxazone   . Epinephrine     Raises heart rate  . Lamotrigine   . Levofloxacin   . Neurontin (Gabapentin)     arthralgia  . Requip     arthralgia  . Tizanidine     panic  . Verapamil   . Vivactil     Severe constipation  . Zonegran     Mood swings  . Advair Diskus (Fluticasone-Salmeterol) Rash  . Baclofen Rash  . Levaquin Rash  . Parafon Forte Dsc Rash     She also needs an order sent to DME for the nebulizer machine because [facility] does not provide the machines. Will place order for this  and send to [facility] to have this brought out to her today.

## 2011-07-23 NOTE — Telephone Encounter (Signed)
It would be ok to take the augmentin

## 2011-07-24 NOTE — Telephone Encounter (Signed)
[email or web address].Blayklee Mable, CMA  

## 2011-07-25 NOTE — Telephone Encounter (Signed)
ATC pt's home # x 3 - # busy.  WCB

## 2011-07-25 NOTE — Telephone Encounter (Signed)
PT RETURNED CALL FROM TRIAGE. Natalie Hendrix  °

## 2011-07-25 NOTE — Telephone Encounter (Signed)
Called, spoke with pt.  She is aware CDY ok with her taking augmentin for symptoms.  She is also requesting I follow up on the ONO order placed bc she has not heard anything regarding this.   I called [facility] and spoke with [facility] and then was placed on the phone with Sherice who states they did receive this and she will call pt now to get this scheduled.  Pt aware [facility] will be contacting her to set this up.

## 2011-08-01 ENCOUNTER — Ambulatory Visit (HOSPITAL_COMMUNITY)
Admission: RE | Admit: 2011-08-01 | Discharge: 2011-08-01 | Disposition: A | Discharge status: Home / Self Care | Payer: Medicare Other | Source: Ambulatory Visit | Attending: Internal Medicine | Admitting: Internal Medicine

## 2011-08-01 ENCOUNTER — Ambulatory Visit (INDEPENDENT_AMBULATORY_CARE_PROVIDER_SITE_OTHER): Payer: Medicare Other | Admitting: Internal Medicine

## 2011-08-01 DIAGNOSIS — J45909 Unspecified asthma, uncomplicated: Secondary | ICD-10-CM

## 2011-08-01 DIAGNOSIS — K219 Gastro-esophageal reflux disease without esophagitis: Secondary | ICD-10-CM

## 2011-08-01 DIAGNOSIS — R1310 Dysphagia, unspecified: Secondary | ICD-10-CM | POA: Insufficient documentation

## 2011-08-01 LAB — PULMONARY FUNCTION TEST

## 2011-08-01 NOTE — Progress Notes (Signed)
PFT done today. 

## 2011-08-06 ENCOUNTER — Ambulatory Visit (HOSPITAL_COMMUNITY)
Admission: RE | Admit: 2011-08-06 | Discharge: 2011-08-06 | Disposition: A | Discharge status: Home / Self Care | Payer: Medicare Other | Source: Ambulatory Visit | Attending: Obstetrics & Gynecology | Admitting: Obstetrics & Gynecology

## 2011-08-06 DIAGNOSIS — Z1231 Encounter for screening mammogram for malignant neoplasm of breast: Secondary | ICD-10-CM | POA: Insufficient documentation

## 2011-08-06 DIAGNOSIS — Z139 Encounter for screening, unspecified: Secondary | ICD-10-CM

## 2011-08-08 ENCOUNTER — Encounter: Payer: Self-pay | Admitting: Internal Medicine

## 2011-08-12 ENCOUNTER — Encounter: Payer: Self-pay | Admitting: Internal Medicine

## 2011-08-23 ENCOUNTER — Ambulatory Visit (INDEPENDENT_AMBULATORY_CARE_PROVIDER_SITE_OTHER): Payer: Medicare Other | Admitting: Internal Medicine

## 2011-08-23 ENCOUNTER — Encounter: Payer: Self-pay | Admitting: Internal Medicine

## 2011-08-23 VITALS — HR 93 | Ht 67.0 in | Wt 214.6 lb

## 2011-08-23 DIAGNOSIS — J45909 Unspecified asthma, uncomplicated: Secondary | ICD-10-CM

## 2011-08-23 MED ORDER — HYDROCODONE-HOMATROPINE 5-1.5 MG/5ML PO SYRP: 5.0000 mL | ORAL_SOLUTION | Freq: Four times a day (QID) | ORAL | Status: AC | PRN

## 2011-08-23 MED ORDER — BENZONATATE 100 MG PO CAPS: 200.0000 mg | ORAL_CAPSULE | ORAL | Status: AC | PRN

## 2011-08-23 MED ORDER — BECLOMETHASONE DIPROPIONATE 40 MCG/ACT IN AERS: 2.0000 | INHALATION_SPRAY | Freq: Two times a day (BID) | RESPIRATORY_TRACT | Status: DC

## 2011-08-23 MED ORDER — ALBUTEROL SULFATE (2.5 MG/3ML) 0.083% IN NEBU: 2.5000 mg | INHALATION_SOLUTION | Freq: Four times a day (QID) | RESPIRATORY_TRACT | Status: DC | PRN

## 2011-08-23 MED ORDER — AZELASTINE HCL 0.1 % NA SOLN: 1.0000 | Freq: Two times a day (BID) | NASAL | Status: DC

## 2011-08-23 MED ORDER — FEXOFENADINE HCL 180 MG PO TABS: 180.0000 mg | ORAL_TABLET | Freq: Every day | ORAL | Status: DC

## 2011-08-23 MED ORDER — ALBUTEROL SULFATE HFA 108 (90 BASE) MCG/ACT IN AERS: 2.0000 | INHALATION_SPRAY | RESPIRATORY_TRACT | Status: DC | PRN

## 2011-08-23 MED ORDER — BECLOMETHASONE DIPROP MONOHYD 42 MCG/SPRAY NA SUSP: 2.0000 | Freq: Two times a day (BID) | NASAL | Status: DC

## 2011-08-23 NOTE — Progress Notes (Signed)
Subjective:    Patient ID: Natalie Hendrix, female    DOB: 1951/05/01, 60 y.o.   MRN: [id number]  HPI  Subjective:   07/22/11- 67 yoF here on kind referral by Dr. Hinda [place]hr [place] because of asthma and history of pneumonia with cough. Husband here. She was a patient of mine in the 1990s at the old practice. She gives a history of asthma and bronchitis. Upper respiratory problems have been more evident since November of 2011. Coughs easily while eating. Coughs each morning bringing up small pellets of green. Wakes nauseated in the morning until she coughs her chest clear. Nebulizer treatments at her doctors for this has seemed very helpful and she would like to have a machine at home. Wears a mask outside and finds she is much bothered by strong odors heat and humidity. She is using Qvar 40 twice daily but blames that for burning in her mouth despite aggressive anti-thrush management. Using Beconase AQ nasal spray, a rescue inhaler once or twice daily and Tessalon pearls.  She feels distinctly better indoors where her husband has installed an extensive air filter system as well as individual room air cleaners in several rooms. She has encasings on bending and they are careful about dust control. Allergy skin testing in the past was positive without allergy vaccine. She has a history of polycythemia vera followed by Dr. Myna Hidalgo with phlebotomy. Polycythemia is planned for cerebrovascular accident which has left her with cognitive and speech difficulties. She also has history of irregular heartbeat on Toprol. Pneumonia in 1978. No history of deep vein thrombosis or pulmonary embolism. Pneumococcal vaccine twice. Flu vaccine last year with blamed recurrent episodes of bronchitis during the winter. Thoracic steal syndrome was addressed with vascular reconstruction on the right in the 1980s. Chest x-ray report from 06/12/2011 described minimal right basilar atelectasis with no acute infiltrate  identified, improved from earlier film. Never smoked. Family history a sister with severe allergy problems, and with COPD, father worked with asbestos exposure and was a heavy smoker   08/23/11- 55 yoF never smoker, asthma and history of pneumonia with cough, complicated by past history surgery for thoracic outlet syndrome, CVA, allergic rhinitis, polycythemia. After last visit August 20, I have some concern that what she was describing was related to upper airway control after her stroke. She comes today for followup of lab work. ONOX-oxygen desaturation equal or less than 89% on room air for 15.4 minutes, less than or equal to 88% for 9.9 minutes, and lowest recorded saturation 82% on 07/29/2011. This could justify home oxygen for sleep. PFT-08/01/2011-normal baseline spirometry with some small airway response to bronchodilator, normal lung volumes, diffusion moderately reduced. FEV1/FVC 0.85, TLC 96%, DLCO 62%. Alpha I antitrypsin assessment: Normal MM. Speech therapy modified barium swallow was performed 08/01/2011 but I do not yet have the therapist report. I have counseled her care with swallowing, careful chewing and swallowing, sitting upright etc.  Review of Systems Review of Systems See HPI Constitutional:   No-   weight loss, night sweats, fevers, chills, fatigue, lassitude. HEENT:   frequent  headaches,  Some difficulty swallowing,, sore throat,       No-  sneezing, itching, ear ache, nasal congestion, post nasal drip,  CV:  No-   chest pain, orthopnea, PND, swelling in lower extremities, anasarca, dizziness, palpitations Resp: No-   shortness of breath with exertion or at rest.                No-  coughing up of blood.              No-   change in color of mucus.  No- wheezing.   Skin: No-   rash or lesions. GI:  No-   heartburn, indigestion, abdominal pain, nausea, vomiting, diarrhea,                 change in bowel habits, loss of appetite GU: No-   dysuria, change in color of  urine, no urgency or frequency.  No- flank pain. MS:  No-   joint pain or swelling.  No- decreased range of motion.  No- back pain. Neuro- grossly normal to observation, Or:  Psych:  No- change in mood or affect. No depression or anxiety.  No memory loss.    Objective:   Physical Exam  Physical Exam General- Alert, Oriented, Affect-appropriate, Distress- none acute,  overweight Skin- rash-none, lesions- none, excoriation- none Lymphadenopathy- none Head- atraumatic            Eyes- Gross vision intact, PERRLA, conjunctivae clear secretions            Ears- Hearing, canals            Nose- Clear, No- Septal dev, mucus, polyps, erosion, perforation             Throat- Mallampati III , mucosa clear , drainage- none, tonsils- atrophic    Speech is dysphonic, hoarse, hesitant Neck- flexible , trachea midline, no stridor , thyroid nl, carotid no bruit Chest - symmetrical excursion , unlabored           Heart/CV- RRR , no murmur , no gallop  , no rub, nl s1 s2                           - JVD- none , edema- none, stasis changes- none, varices- none           Lung- clear to P&A, wheeze- none, +cough- persistent dry cough , dullness-none, rub- none           Chest wall- Vascular surgery scar Right Upper Anterior chest Abd- tender-no, distended-no, bowel sounds-present, HSM- no Br/ Gen/ Rectal- Not done, not indicated Extrem- cyanosis- none, clubbing, none, atrophy- none, strength- nl. Rolling walker Neuro- grossly intact to observation

## 2011-08-23 NOTE — Patient Instructions (Addendum)
[facility] cough syrup  [facility]  2 L/M for sleep  Based on ONOX 07/29/11  Meds refilled for 90 day refills  Please call as needed  Pay ongoing attention to swallowing and chew carefully to make sure it goes down right.

## 2011-08-27 NOTE — Assessment & Plan Note (Signed)
There is potential reversible obstructive disease suggested by the small airway response to bronchodilator, even at baseline scores on that day were normal. The implication is that on other days she may be a little tighter and symptomatic. This can justify a trial of bronchodilator therapy. Meanwhile careful attention needs to be paid to her laryngeal confidence and potential for aspiration. While we try to sort out her problems, I ordering home oxygen for sleep based on the modest length of time with recorded desaturation.

## 2011-09-10 ENCOUNTER — Encounter: Payer: Self-pay | Admitting: Internal Medicine

## 2011-09-12 ENCOUNTER — Encounter (HOSPITAL_BASED_OUTPATIENT_CLINIC_OR_DEPARTMENT_OTHER): Payer: Medicare Other | Admitting: Hematology & Oncology

## 2011-09-12 ENCOUNTER — Other Ambulatory Visit: Payer: Self-pay | Admitting: Hematology & Oncology

## 2011-09-12 DIAGNOSIS — J189 Pneumonia, unspecified organism: Secondary | ICD-10-CM

## 2011-09-12 DIAGNOSIS — D45 Polycythemia vera: Secondary | ICD-10-CM

## 2011-09-12 DIAGNOSIS — R1110 Vomiting, unspecified: Secondary | ICD-10-CM

## 2011-09-12 DIAGNOSIS — Z9981 Dependence on supplemental oxygen: Secondary | ICD-10-CM

## 2011-09-12 LAB — IRON AND TIBC: UIBC: >450 ug/dL — ABNORMAL HIGH (ref 125–400)

## 2011-09-12 LAB — CBC WITH DIFFERENTIAL (CANCER CENTER ONLY)
BASO#: 0 10*3/uL (ref 0.0–0.2)
EOS%: 2.4 % (ref 0.0–7.0)
HGB: 10.6 g/dL — ABNORMAL LOW (ref 11.6–15.9)
LYMPH#: 2.1 10*3/uL (ref 0.9–3.3)
MCHC: 31.8 g/dL — ABNORMAL LOW (ref 32.0–36.0)
MONO#: 0.6 10*3/uL (ref 0.1–0.9)
NEUT#: 3.9 10*3/uL (ref 1.5–6.5)
RBC: 4.86 10*6/uL (ref 3.70–5.32)
WBC: 6.8 10*3/uL (ref 3.9–10.0)

## 2011-09-12 LAB — VITAMIN D 25 HYDROXY (VIT D DEFICIENCY, FRACTURES): Vit D, 25-Hydroxy: 41 ng/mL (ref 30–89)

## 2011-09-12 LAB — COMPREHENSIVE METABOLIC PANEL
AST: 26 U/L (ref 0–37)
Albumin: 4.1 g/dL (ref 3.5–5.2)
Alkaline Phosphatase: 70 U/L (ref 39–117)
BUN: 13 mg/dL (ref 6–23)
Creatinine, Ser: 0.85 mg/dL (ref 0.50–1.10)
Potassium: 4.1 mEq/L (ref 3.5–5.3)

## 2011-09-12 LAB — RETICULOCYTES (CHCC)
ABS Retic: 60.6 10*3/uL (ref 19.0–186.0)
RBC.: 5.05 MIL/uL (ref 3.87–5.11)
Retic Ct Pct: 1.2 % (ref 0.4–2.3)

## 2011-09-23 ENCOUNTER — Telehealth: Payer: Self-pay | Admitting: Internal Medicine

## 2011-09-23 NOTE — Telephone Encounter (Signed)
I don't have documentation of low sat with rest or exercise, unless we can find one. Needs 6 MWT. I can't order portable O2 unless we can show that the O2 drops. She can sit and use her home oxygen for awhile during the daytime if needed.,

## 2011-09-23 NOTE — Telephone Encounter (Signed)
Spoke with pt's spouse. He states that he was under the impression that pt could use the o2 during the day as well as at night if needed. He states that he feels that this would benefit the pt. Would like order sent to [facility] to have o2 during the day, portable. Please advise, thanks!

## 2011-09-23 NOTE — Telephone Encounter (Signed)
lmomtcb  

## 2011-09-24 NOTE — Telephone Encounter (Signed)
lmomtcb  

## 2011-09-24 NOTE — Telephone Encounter (Signed)
Called, spoke with pt's husband, Darrell.  States CDY ordered pt to have o2 a few weeks ago for bedtime use.  I advised we cannot order portable o2 unless we can show her o2 sat drops and we do not have any documentation of this so CDY recs pt come in for 6 MWT.  He would like to go ahead and set this up now - scheduled for Monday, Oct 29 at 3 pm -- Darrell aware.  I also informed him CDY states pt can sit and use her home o2 for a while during the daytime if needed.  He verbalized understanding of these instructions and will call back if anything further is needed.

## 2011-09-24 NOTE — Telephone Encounter (Signed)
Pt's husband, Laban Emperor, returned triage's call & can be reached at [phone].  Antionette Fairy

## 2011-09-30 ENCOUNTER — Telehealth: Payer: Self-pay | Admitting: *Deleted

## 2011-09-30 ENCOUNTER — Ambulatory Visit: Payer: Medicare Other

## 2011-09-30 MED ORDER — PROMETHAZINE-CODEINE 6.25-10 MG/5ML PO SYRP: 5.0000 mL | ORAL_SOLUTION | Freq: Four times a day (QID) | ORAL | Status: AC | PRN

## 2011-09-30 NOTE — Telephone Encounter (Signed)
Per CY-continue taking ABX patient is currently on and okay to call in phenergan with codeine cough syrup #272ml take 1 tsp every 6 hours prn cough no refills. Pt will call back to [facility] .    Pt is aware that Rx was called to pharmacy.

## 2011-10-15 ENCOUNTER — Other Ambulatory Visit: Payer: Self-pay | Admitting: Family

## 2011-10-15 DIAGNOSIS — D45 Polycythemia vera: Secondary | ICD-10-CM

## 2011-10-15 MED ORDER — CLOPIDOGREL BISULFATE 75 MG PO TABS: 75.0000 mg | ORAL_TABLET | Freq: Every day | ORAL | Status: DC

## 2011-10-16 ENCOUNTER — Ambulatory Visit (INDEPENDENT_AMBULATORY_CARE_PROVIDER_SITE_OTHER): Payer: Medicare Other | Admitting: Internal Medicine

## 2011-10-16 DIAGNOSIS — J45909 Unspecified asthma, uncomplicated: Secondary | ICD-10-CM

## 2011-10-16 DIAGNOSIS — Z23 Encounter for immunization: Secondary | ICD-10-CM

## 2011-10-16 MED ORDER — LEVALBUTEROL HCL 0.63 MG/3ML IN NEBU
0.6300 mg | INHALATION_SOLUTION | Freq: Once | RESPIRATORY_TRACT | Status: AC
  Administered 2011-10-16: 0.63 mg via RESPIRATORY_TRACT

## 2011-12-16 ENCOUNTER — Ambulatory Visit (HOSPITAL_BASED_OUTPATIENT_CLINIC_OR_DEPARTMENT_OTHER): Payer: Medicare Other | Admitting: Hematology & Oncology

## 2011-12-16 ENCOUNTER — Other Ambulatory Visit: Payer: Self-pay | Admitting: Hematology & Oncology

## 2011-12-16 ENCOUNTER — Other Ambulatory Visit (HOSPITAL_BASED_OUTPATIENT_CLINIC_OR_DEPARTMENT_OTHER): Payer: Medicare Other | Admitting: Lab

## 2011-12-16 DIAGNOSIS — D509 Iron deficiency anemia, unspecified: Secondary | ICD-10-CM

## 2011-12-16 DIAGNOSIS — D45 Polycythemia vera: Secondary | ICD-10-CM

## 2011-12-16 LAB — COMPREHENSIVE METABOLIC PANEL
ALT: 22 U/L (ref 0–35)
AST: 20 U/L (ref 0–37)
Albumin: 4 g/dL (ref 3.5–5.2)
Albumin: 4 g/dL (ref 3.5–5.2)
Alkaline Phosphatase: 58 U/L (ref 39–117)
BUN: 15 mg/dL (ref 6–23)
Calcium: 9.3 mg/dL (ref 8.4–10.5)
Chloride: 105 mEq/L (ref 96–112)
Chloride: 105 mEq/L (ref 96–112)
Creatinine, Ser: 0.69 mg/dL (ref 0.50–1.10)
Glucose, Bld: 99 mg/dL (ref 70–99)
Potassium: 4.4 mEq/L (ref 3.5–5.3)
Potassium: 4.4 mEq/L (ref 3.5–5.3)
Sodium: 141 mEq/L (ref 135–145)
Total Protein: 6.1 g/dL (ref 6.0–8.3)

## 2011-12-16 LAB — CBC WITH DIFFERENTIAL (CANCER CENTER ONLY)
BASO%: 0.3 % (ref 0.0–2.0)
LYMPH#: 2.3 10*3/uL (ref 0.9–3.3)
MONO#: 1 10*3/uL — ABNORMAL HIGH (ref 0.1–0.9)
Platelets: 313 10*3/uL (ref 145–400)
RDW: 17.7 % — ABNORMAL HIGH (ref 11.1–15.7)
WBC: 10.2 10*3/uL — ABNORMAL HIGH (ref 3.9–10.0)

## 2011-12-16 LAB — LACTATE DEHYDROGENASE: LDH: 173 U/L (ref 94–250)

## 2011-12-16 NOTE — Progress Notes (Signed)
This office note has been dictated.

## 2011-12-17 ENCOUNTER — Telehealth: Payer: Self-pay | Admitting: Hematology & Oncology

## 2011-12-17 NOTE — Telephone Encounter (Signed)
Per MD pt can come here on 2-5 or 2-8. Pt made 2-8 appointment

## 2011-12-17 NOTE — Progress Notes (Signed)
CC:   Natalie Hendrix. Love, M.D. Natalie Hendrix. Natalie Hendrix, M.D. Natalie Hendrix, M.D. Natalie Hendrix. Natalie Dice, MD,FACG Natalie D. Young, MD, FCCP, FACP  DIAGNOSES: 1. Polycythemia vera, JAK2 negative. 2. Complications from past cerebrovascular accident. 3. Chronic low back pain.  CURRENT THERAPY: 1. Phlebotomy to maintain hematocrit less than 45%. 2. Plavix 75 mg p.o. daily.  INTERIM HISTORY:  [name] comes in for follow visit.. We las saw her in October.  Since then, she has been having a lot of issues.  Her back is giving her a lot of problems.  She sees Dr. Thyra Breed  for this. She is on a short course of steroids.  This has made her a little bit "emotional."  She, I think, did see Dr. Sandria Manly of Neurology.  He did recommend some studies to be done for her.  He feels that her neurologic status is worsening.  She has been having some issues with aspiration.  She needs to be watched and be careful what she eats.  When we last saw her in October, her ferritin was only 3. Which clearly made her iron-deficient with phlebotomies.  She has had no bleeding.  She has had no seizure.  She has had no bowel or bladder incontinence.  Overall, performance status is ECOG 2-3.  PHYSICAL EXAM:  General: This is a fairly well-developed, well-nourished white female in no obvious distress.  Vital signs: Show temperature of 98.4, pulse 82, respiratory 16, blood pressure 119/73, and weight is 216.  Head and neck exam: Shows a normocephalic, atraumatic skull. There are no ocular or oral lesions.  There are no palpable cervical or supraclavicular lymph nodes.  Lungs are clear bilaterally.  Cardiac examination:  Regular rate and rhythm with a normal S1 and S2.  There are no murmurs, or rubs, or bruits.  Abdominal exam: Soft with good bowel sounds.  There is no palpable abdominal mass.  There is no fluid wave.  There is no palpable hepatosplenomegaly. Back exam:  No tenderness over the spine, ribs, or hips.   Extremities shows: No clubbing, cyanosis, or edema.  Neurological exam: Shows some ataxia which is chronic.  She may have a little bit of nystagmus.  LABORATORY STUDIES:  White cell count 10.2, hemoglobin 10.6, hematocrit 33.4, and platelet count 313.  MCV 69.  IMPRESSION:  [name] is a 61 year old white female with polycythemia. She is doing okay with this.  She has not had any complications from this for several years.  It is hard to say if her past  cerebrovascular accident was reflective of her polycythemia.  We have been quite aggressive in maintaining her hematocrit below 45.  The patient continues to be iron deficient.  This is somewhat "needed" with respect to her polycythemia.  I just feel bad that she is having difficulties neurologically.  I do want to see [name] back in about 4-6 weeks.  She is having issues that I just want to make sure we try to follow closely so that she does not end up in the hospital.    ______________________________ Josph Macho, M.D. PRE/MEDQ  D:  12/16/2011  T:  12/16/2011  Job:  [id number]

## 2011-12-27 ENCOUNTER — Ambulatory Visit: Payer: Medicare Other | Admitting: Internal Medicine

## 2011-12-27 ENCOUNTER — Encounter: Payer: Self-pay | Admitting: Internal Medicine

## 2011-12-27 ENCOUNTER — Ambulatory Visit (INDEPENDENT_AMBULATORY_CARE_PROVIDER_SITE_OTHER): Payer: Medicare Other | Admitting: Internal Medicine

## 2011-12-27 VITALS — BP 102/62 | HR 81 | Ht 67.0 in | Wt 217.8 lb

## 2011-12-27 DIAGNOSIS — D45 Polycythemia vera: Secondary | ICD-10-CM

## 2011-12-27 DIAGNOSIS — E663 Overweight: Secondary | ICD-10-CM

## 2011-12-27 DIAGNOSIS — J45909 Unspecified asthma, uncomplicated: Secondary | ICD-10-CM

## 2011-12-27 DIAGNOSIS — K219 Gastro-esophageal reflux disease without esophagitis: Secondary | ICD-10-CM

## 2011-12-27 NOTE — Progress Notes (Signed)
Subjective:    Patient ID: Natalie Hendrix, female    DOB: 1951/11/26, 61 y.o.   MRN: [id number]  HPI  Subjective:   07/22/11- 18 yoF here on kind referral by Dr. Hinda Kehr [place] because of asthma and history of pneumonia with cough. Husband here. She was a patient of mine in the 1990s at the old practice. She gives a history of asthma and bronchitis. Upper respiratory problems have been more evident since November of 2011. Coughs easily while eating. Coughs each morning bringing up small pellets of green. Wakes nauseated in the morning until she coughs her chest clear. Nebulizer treatments at her doctors for this has seemed very helpful and she would like to have a machine at home. Wears a mask outside and finds she is much bothered by strong odors heat and humidity. She is using Qvar 40 twice daily but blames that for burning in her mouth despite aggressive anti-thrush management. Using Beconase AQ nasal spray, a rescue inhaler once or twice daily and Tessalon pearls.  She feels distinctly better indoors where her husband has installed an extensive air filter system as well as individual room air cleaners in several rooms. She has encasings on bending and they are careful about dust control. Allergy skin testing in the past was positive without allergy vaccine. She has a history of polycythemia vera followed by Dr. Myna Hidalgo with phlebotomy. Polycythemia is planned for cerebrovascular accident which has left her with cognitive and speech difficulties. She also has history of irregular heartbeat on Toprol. Pneumonia in 1978. No history of deep vein thrombosis or pulmonary embolism. Pneumococcal vaccine twice. Flu vaccine last year with blamed recurrent episodes of bronchitis during the winter. Thoracic steal syndrome was addressed with vascular reconstruction on the right in the 1980s. Chest x-ray report from 06/12/2011 described minimal right basilar atelectasis with no acute infiltrate  identified, improved from earlier film. Never smoked. Family history a sister with severe allergy problems, and with COPD, father worked with asbestos exposure and was a heavy smoker   08/23/11- 67 yoF never smoker, asthma and history of pneumonia with cough, complicated by past history surgery for thoracic outlet syndrome, CVA, allergic rhinitis, polycythemia. After last visit August 20, I have some concern that what she was describing was related to upper airway control after her stroke. She comes today for followup of lab work. ONOX-oxygen desaturation equal or less than 89% on room air for 15.4 minutes, less than or equal to 88% for 9.9 minutes, and lowest recorded saturation 82% on 07/29/2011. This could justify home oxygen for sleep. PFT-08/01/2011-normal baseline spirometry with some small airway response to bronchodilator, normal lung volumes, diffusion moderately reduced. FEV1/FVC 0.85, TLC 96%, DLCO 62%. Alpha I antitrypsin assessment: Normal MM. Speech therapy modified barium swallow was performed 08/01/2011 but I do not yet have the therapist report. I have counseled her care with swallowing, careful chewing and swallowing, sitting upright etc.  12/27/11-  60 yoF never smoker, asthma and history of pneumonia with cough, complicated by past history surgery for thoracic outlet syndrome, CVA, allergic rhinitis, polycythemia. Husband here  PCP Dr Modesto Charon After overnight oximetry she qualified for home oxygen during sleep at 2 L per minute/[facility]. They have an electrical generator and her husband is getting a backup generator. She chokes occasionally eating without obvious reflux. We discussed dysphagia and aspiration risk. Speech therapist report describes esophageal dysphagia without aspiration. A copy of this report will be forwarded to Dr. Modesto Charon who might consider speech therapy  for swallowing training. Dr Myna Hidalgo gave prednisone for polycythemia.  Review of Systems- See  HPI Constitutional:   No-   weight loss, night sweats, fevers, chills, fatigue, lassitude. HEENT:   frequent  headaches,  Some difficulty swallowing,, sore throat,       No-  sneezing, itching, ear ache, nasal congestion, post nasal drip,  CV:  No-   chest pain, orthopnea, PND, swelling in lower extremities, anasarca, dizziness, palpitations Resp: + shortness of breath with exertion or at rest.                No-  coughing up of blood.              No-   change in color of mucus.  No- wheezing.   Skin: No-   rash or lesions. GI:  No-   heartburn, indigestion, abdominal pain, nausea, vomiting, diarrhea,                 change in bowel habits, loss of appetite GU:  MS:  No-   joint pain or swelling.  No- decreased range of motion.  No- back pain. Neuro- grossly normal to observation, Or:  Psych:  No- change in mood or affect. No depression or anxiety.  No memory loss.    Objective:   Physical Exam  Physical Exam General- Alert, Oriented, Affect-appropriate, Distress- none acute,  overweight Skin- rash-none, lesions- none, excoriation- none Lymphadenopathy- none Head- atraumatic            Eyes- Gross vision intact, PERRLA, conjunctivae clear secretions            Ears- Hearing, canals            Nose- Clear, No- Septal dev, mucus, polyps, erosion, perforation             Throat- Mallampati III , mucosa clear , drainage- none, tonsils- atrophic    Speech is dysphonic, hoarse, hesitant Neck- flexible , trachea midline, no stridor , thyroid nl, carotid no bruit Chest - symmetrical excursion , unlabored           Heart/CV- RRR , no murmur , no gallop  , no rub, nl s1 s2                           - JVD- none , edema- none, stasis changes- none, varices- none           Lung- clear to P&A, wheeze- none, +cough- mild dry cough , dullness-none, rub- none           Chest wall- Vascular surgery scar Right Upper Anterior chest Abd- Br/ Gen/ Rectal- Not done, not indicated Extrem- cyanosis- none,  clubbing, none, atrophy- none, strength- nl. Rolling walker Neuro- grossly intact to observation with much restless positional shifting

## 2011-12-27 NOTE — Patient Instructions (Signed)
Continue oxygen for sleep  Continue present meds  Be careful about swallowing as discussed. Dr Modesto Charon may want you to work with a Speech Therapist about swallowing techniques and food choices because of your esophageal dysphagia.

## 2011-12-29 NOTE — Assessment & Plan Note (Signed)
She can tell that food doesn't always go down smoothly, suggesting dysmotility. Reflux precautions were advised.

## 2011-12-29 NOTE — Assessment & Plan Note (Signed)
If oxygen desaturation during sleep was pushing her tendency towards polycythemia, then we may see some improvement over time as she continues sleeping with oxygen.

## 2011-12-29 NOTE — Assessment & Plan Note (Signed)
She seems to like having oxygen at night and sleeps comfortably with it. Wheezing is well controlled.

## 2011-12-29 NOTE — Assessment & Plan Note (Signed)
Probable component of obesity hypoventilation syndrome.

## 2012-01-10 ENCOUNTER — Other Ambulatory Visit (HOSPITAL_BASED_OUTPATIENT_CLINIC_OR_DEPARTMENT_OTHER): Payer: Medicare Other | Admitting: Lab

## 2012-01-10 ENCOUNTER — Ambulatory Visit (HOSPITAL_BASED_OUTPATIENT_CLINIC_OR_DEPARTMENT_OTHER): Payer: Medicare Other | Admitting: Hematology & Oncology

## 2012-01-10 DIAGNOSIS — D45 Polycythemia vera: Secondary | ICD-10-CM

## 2012-01-10 DIAGNOSIS — Z8673 Personal history of transient ischemic attack (TIA), and cerebral infarction without residual deficits: Secondary | ICD-10-CM

## 2012-01-10 DIAGNOSIS — M79609 Pain in unspecified limb: Secondary | ICD-10-CM

## 2012-01-10 LAB — CBC WITH DIFFERENTIAL (CANCER CENTER ONLY)
BASO%: 0.4 % (ref 0.0–2.0)
HCT: 35.8 % (ref 34.8–46.6)
LYMPH%: 32.9 % (ref 14.0–48.0)
MCH: 21.9 pg — ABNORMAL LOW (ref 26.0–34.0)
MCHC: 31 g/dL — ABNORMAL LOW (ref 32.0–36.0)
MCV: 71 fL — ABNORMAL LOW (ref 81–101)
MONO#: 0.8 10*3/uL (ref 0.1–0.9)
MONO%: 11.6 % (ref 0.0–13.0)
NEUT%: 51.8 % (ref 39.6–80.0)
RDW: 18 % — ABNORMAL HIGH (ref 11.1–15.7)
WBC: 6.8 10*3/uL (ref 3.9–10.0)

## 2012-01-10 NOTE — Progress Notes (Signed)
This office note has been dictated.

## 2012-01-11 NOTE — Progress Notes (Signed)
CC:   Mark L. Vear Clock, M.D. Francis P. Modesto Charon, M.D. Clinton D. Maple Hudson, MD, FCCP, Della Goo D. Arlyce Dice, MD,FACG Genene Churn. Love, M.D.  DIAGNOSIS: 1. Polycythemia vera, JAK2 negative. 2. History of CVA. 3. Chronic low back pain.  CURRENT THERAPY: 1. Phlebotomy to maintain hematocrit less than 45%. 2. Plavix 75 mg p.o. daily.  INTERIM HISTORY:  Ms. Nicks comes in for follow up. The patient is doing better than when we last saw her a few weeks ago.  Dr. Vear Clock did do an epidural injection.  She completed her steroid course.  She is feeling better.  She still has some pain in the right leg. There is no pain in the left leg.  She is having no issues with cough or shortness of breath.  She seems to be a swallowing better.  She is having no change in bowel or bladder habits.  When we last saw her, her ferritin was 4.  PHYSICAL EXAMINATION:  This is a well-developed well-nourished white female in no obvious distress.  Vital signs:  Temperature 98.2, pulse 85, respiratory rate 20, blood pressure 113/72.  Weight is 213.  Head and neck:  Normocephalic, atraumatic skull.  No ocular or oral lesions. She has no palpable cervical  or supraclavicular lymph nodes.  Thyroid is nonpalpable.  Lungs:  Clear bilaterally.  She has no rales, wheezes or rhonchi.  Cardiac:  Regular rate and rhythm with a normal S1 and S2. There are no murmurs, rubs or bruits.  Abdomen: Soft with good bowel sounds.  There is no palpable abdominal mass.  There is no fluid wave. There is no palpable hepatosplenomegaly.  Back:  No tenderness over the spine, ribs, or hips.  No paravertebral muscle spasm is noted in the lumbosacral spine.  Extremities:  No clubbing, cyanosis or edema.  Skin: No rashes, ecchymosis or petechia.  Neurologic:  Exam does show a little bit of a tremor.  LABORATORY STUDIES:  White cell count 6.8, hemoglobin 11.1, hematocrit 35.8, platelet count 331.  MCV is 71.  IMPRESSION:  Ms. Abraha is a very  nice 61 year old white female with polycythemia vera.  She is doing well with this.  She has a history of past CVA.  It is hard to say whether not the CVA was related to the polycythemia.  Again, since I have been seeing her, we have been very aggressive with keeping her hematocrit below 45%.  I am glad to see that her quality of life is better now.  Dr. Vear Clock has done a great job with her.  We will plan to get her back now in 2 months for followup.    ______________________________ Josph Macho, M.D. PRE/MEDQ  D:  01/10/2012  T:  01/11/2012  Job:  [id number]

## 2012-01-20 ENCOUNTER — Telehealth: Payer: Self-pay | Admitting: Internal Medicine

## 2012-01-20 NOTE — Telephone Encounter (Signed)
I spoke with pt and she stated [facility] had contacted her about setting up her pulm stress test and she stated she did not know anything about this. i looked in pt chart and cdy did sign for her to have this bc she is due for recert for her o2. I advised pt of this and she voiced her understanding and had no questions

## 2012-01-27 ENCOUNTER — Telehealth: Payer: Self-pay | Admitting: Internal Medicine

## 2012-01-27 NOTE — Telephone Encounter (Signed)
lmomtcb x1 for pt 

## 2012-01-28 NOTE — Telephone Encounter (Signed)
Pt return call. Natalie Hendrix  °

## 2012-01-28 NOTE — Telephone Encounter (Signed)
Spoke with pt's spouse. He states that [facility] has sent [facility] a fax requesting new rx for o2 with exertion- this was based on the oximetry test done at her home. I advised that it looks like we did not order this, and that we predcribe the o2 for hs only. [facility], have you received a fax on this or do you wish to prescribe o2 for daytime use? Please advise, thanks!

## 2012-01-28 NOTE — Telephone Encounter (Signed)
Called patient-husband answer stated patient was in dentist office and would need to call back. Will await call.

## 2012-01-28 NOTE — Telephone Encounter (Signed)
Spouse returned call from [place]. [name]

## 2012-01-28 NOTE — Telephone Encounter (Signed)
I called and got information from [facility] faxed once again for CY to review and advise asap. Placed on CY's cart.

## 2012-01-28 NOTE — Telephone Encounter (Signed)
I need documentation that she desaturates during exertion to qualify for portable O2. We only have the overnight oximetry. Would need to order rest/ exercise room air oximetry or get a 6 MWT here to see if she can qualify.

## 2012-01-30 NOTE — Telephone Encounter (Signed)
Rest and exercise Oximetry on room air and O2 01/21/12 show that she does desaturate w/ exertion. Will have [facility] add portable O2, assessed for light portable.

## 2012-01-30 NOTE — Telephone Encounter (Signed)
Order placed and pt is aware. Tywone Bembenek, CMA  

## 2012-02-13 ENCOUNTER — Encounter: Payer: Self-pay | Admitting: Internal Medicine

## 2012-03-13 ENCOUNTER — Ambulatory Visit (HOSPITAL_BASED_OUTPATIENT_CLINIC_OR_DEPARTMENT_OTHER): Payer: Medicare Other | Admitting: Hematology & Oncology

## 2012-03-13 ENCOUNTER — Other Ambulatory Visit (HOSPITAL_BASED_OUTPATIENT_CLINIC_OR_DEPARTMENT_OTHER): Payer: Medicare Other | Admitting: Lab

## 2012-03-13 VITALS — BP 118/71 | HR 80 | Temp 97.2°F | Ht 67.0 in | Wt 214.0 lb

## 2012-03-13 DIAGNOSIS — D45 Polycythemia vera: Secondary | ICD-10-CM

## 2012-03-13 LAB — CBC WITH DIFFERENTIAL (CANCER CENTER ONLY)
BASO%: 0.7 % (ref 0.0–2.0)
EOS%: 1.8 % (ref 0.0–7.0)
LYMPH#: 2.1 10*3/uL (ref 0.9–3.3)
MCHC: 31.7 g/dL — ABNORMAL LOW (ref 32.0–36.0)
MONO#: 0.6 10*3/uL (ref 0.1–0.9)
NEUT#: 5.6 10*3/uL (ref 1.5–6.5)
RDW: 18 % — ABNORMAL HIGH (ref 11.1–15.7)
WBC: 8.4 10*3/uL (ref 3.9–10.0)

## 2012-03-13 NOTE — Progress Notes (Signed)
CC:   Natalie Hendrix, M.D. Natalie D. Maple Hudson, MD, FCCP, Natalie Goo D. Arlyce Dice, MD,FACG Natalie Hendrix, M.D. Natalie Hendrix, M.D.  DIAGNOSES: 1. Polycythemia vera, JAK2 negative. 2. History of cerebrovascular accident with some residual dysarthria. 3. Chronic low back pain with some right leg radiation.  CURRENT THERAPY: 1. Phlebotomy to maintain hematocrit less than 45%. 2. Plavix 75 mg p.o. daily.  INTERIM HISTORY:  Natalie Hendrix comes in for her followup.  She is doing pretty well.  Unfortunately, she had, I think, pneumonia or some pulmonary issue a few weeks ago.  She now is wearing some oxygen.  She says that she desaturates when she walks.  She is still having some pain in the right lower leg.  She does see Dr. Thyra Breed.  He did give her an epidural injection awhile back.  She said that this really was not too effective.  She has had no problems falling.  She does have some chronic vertigo issues.  She sees Dr. Avie Echevaria of [facility] for her neurological issues.  She has not been phlebotomized in a long time.  She is quite iron- deficient.  Her last ferritin that we checked on her back in April was 4.  She has had no problems fever-wise.  She has had occasional headache.  PHYSICAL EXAMINATION:  This is a well-developed, well-nourished white female in no obvious distress.  Vital signs:  97.2, pulse 82, respiratory rate 16, blood pressure 118/71.  Weight is 214.  Head and neck exam shows a normocephalic, atraumatic skull.  There are no ocular or oral lesions.  There are no palpable cervical or supraclavicular lymph nodes.  Lungs:  Clear bilaterally.  There are no rales, wheezes or rhonchi.  Cardiac:  Regular rate and rhythm with a normal S1 and S2. She has no murmurs, rubs or bruits.  Abdomen:  Soft with good bowel sounds.  There is no palpable abdominal mass.  There is no fluid wave. There is no palpable hepatosplenomegaly.  Extremities show no  clubbing, cyanosis or edema.  There is some isolated pain along the tibial region in the right leg in the middle portion of the tibia.  Neurological exam shows no focal neurological deficits.  Skin:  No rashes, ecchymoses or petechiae.  LABORATORY STUDIES:  White cell count 8.4, hemoglobin 11.7, hematocrit 36.9, platelet count 308.  IMPRESSION:  Natalie Hendrix is a 60 year old white female with polycythemia vera.  She is doing well with this.  She has not required a phlebotomy for quite awhile.  We will go ahead and plan to get her back in another couple of months. I do not think that we need to have any blood work done in between visits.    ______________________________ Josph Macho, M.D. PRE/MEDQ  D:  03/13/2012  T:  03/13/2012  Job:  [id number]

## 2012-03-13 NOTE — Progress Notes (Signed)
This office note has been dictated.

## 2012-03-16 LAB — COMPREHENSIVE METABOLIC PANEL
ALT: 19 U/L (ref 0–35)
AST: 25 U/L (ref 0–37)
Albumin: 4 g/dL (ref 3.5–5.2)
Alkaline Phosphatase: 70 U/L (ref 39–117)
Calcium: 9.5 mg/dL (ref 8.4–10.5)
Chloride: 106 mEq/L (ref 96–112)
Potassium: 4.6 mEq/L (ref 3.5–5.3)
Sodium: 139 mEq/L (ref 135–145)
Total Protein: 6.7 g/dL (ref 6.0–8.3)

## 2012-03-16 LAB — FERRITIN: Ferritin: 3 ng/mL — ABNORMAL LOW (ref 10–291)

## 2012-04-20 ENCOUNTER — Encounter: Payer: Self-pay | Admitting: Internal Medicine

## 2012-04-20 ENCOUNTER — Ambulatory Visit (INDEPENDENT_AMBULATORY_CARE_PROVIDER_SITE_OTHER): Payer: Medicare Other | Admitting: Internal Medicine

## 2012-04-20 VITALS — BP 108/76 | HR 83 | Ht 67.0 in | Wt 213.0 lb

## 2012-04-20 DIAGNOSIS — K219 Gastro-esophageal reflux disease without esophagitis: Secondary | ICD-10-CM

## 2012-04-20 DIAGNOSIS — J309 Allergic rhinitis, unspecified: Secondary | ICD-10-CM

## 2012-04-20 DIAGNOSIS — J45998 Other asthma: Secondary | ICD-10-CM

## 2012-04-20 DIAGNOSIS — D45 Polycythemia vera: Secondary | ICD-10-CM

## 2012-04-20 DIAGNOSIS — J45909 Unspecified asthma, uncomplicated: Secondary | ICD-10-CM

## 2012-04-20 MED ORDER — FEXOFENADINE HCL 180 MG PO TABS: 180.0000 mg | ORAL_TABLET | Freq: Every day | ORAL | Status: DC

## 2012-04-20 NOTE — Patient Instructions (Addendum)
Sample Spiriva  1 daily  Then try Sample Arcapta  1 daily  These are two different meds for relaxing and opening airways.   Script for Industrial/product designer for 90 day [employer]

## 2012-04-20 NOTE — Progress Notes (Signed)
Subjective:   07/22/11- 37 yoF here on kind referral by Dr. Ernst Spell [place] because of asthma and history of pneumonia with cough. Husband here. She was a patient of mine in the [place] at the old practice. She gives a history of asthma and bronchitis. Upper respiratory problems have been more evident since November of 2011. Coughs easily while eating. Coughs each morning bringing up small pellets of green. Wakes nauseated in the morning until she coughs her chest clear. Nebulizer treatments at her doctors for this has seemed very helpful and she would like to have a machine at home. Wears a mask outside and finds she is much bothered by strong odors heat and humidity. She is using Qvar 40 twice daily but blames that for burning in her mouth despite aggressive anti-thrush management. Using Beconase AQ nasal spray, a rescue inhaler once or twice daily and Tessalon pearls.  She feels distinctly better indoors where her husband has installed an extensive air filter system as well as individual room air cleaners in several rooms. She has encasings on bending and they are careful about dust control. Allergy skin testing in the past was positive without allergy vaccine. She has a history of polycythemia vera followed by Dr. Marin Olp with phlebotomy. Polycythemia is planned for cerebrovascular accident which has left her with cognitive and speech difficulties. She also has history of irregular heartbeat on Toprol. Pneumonia in 1978. No history of deep vein thrombosis or pulmonary embolism. Pneumococcal vaccine twice. Flu vaccine last year with blamed recurrent episodes of bronchitis during the winter. Thoracic steal syndrome was addressed with vascular reconstruction on the right in the 1980s. Chest x-ray report from 06/12/2011 described minimal right basilar atelectasis with no acute infiltrate identified, improved from earlier film. Never smoked. Family history a sister with severe allergy problems, and  with COPD, father worked with asbestos exposure and was a heavy smoker   08/23/11- 52 yoF never smoker, asthma and history of pneumonia with cough, complicated by past history surgery for thoracic outlet syndrome, CVA, allergic rhinitis, polycythemia. After last visit August 20, I have some concern that what she was describing was related to upper airway control after her stroke. She comes today for followup of lab work. ONOX-oxygen desaturation equal or less than 89% on room air for 15.4 minutes, less than or equal to 88% for 9.9 minutes, and lowest recorded saturation 82% on 07/29/2011. This could justify home oxygen for sleep. PFT-08/01/2011-normal baseline spirometry with some small airway response to bronchodilator, normal lung volumes, diffusion moderately reduced. FEV1/FVC 0.85, TLC 96%, DLCO 62%. Alpha I antitrypsin assessment: Normal MM. Speech therapy modified barium swallow was performed 08/01/2011 but I do not yet have the therapist report. I have counseled her care with swallowing, careful chewing and swallowing, sitting upright etc.  12/27/11-  20 yoF never smoker, asthma and history of pneumonia with cough, complicated by past history surgery for thoracic outlet syndrome, CVA, allergic rhinitis, polycythemia. Husband here  PCP Dr Jacelyn Grip After overnight oximetry she qualified for home oxygen during sleep at 2 L per minute/[facility]. They have an electrical generator and her husband is getting a backup generator. She chokes occasionally eating without obvious reflux. We discussed dysphagia and aspiration risk. Speech therapist report describes esophageal dysphagia without aspiration. A copy of this report will be forwarded to Dr. Jacelyn Grip who might consider speech therapy for swallowing training. Dr Marin Olp gave prednisone for polycythemia.  04/20/12- 39 yoF never smoker, asthma and history of pneumonia with cough, complicated by  past history surgery for thoracic outlet syndrome, CVA, allergic  rhinitis, polycythemia. Husband here    PCP Dr Modesto Charon  Husband is here  PT STATES INCREASE SOB,WHEEZING,DUE TO SEASON. HAS A DRY HACKY COUGH  She is blaming bad weather for her increased cough. They put a big air cleaner system in the home. She has been staying indoors to avoid pollen. Benzonatate helps her chronic cough. Nebulizer helps her wheezing.  Wears oxygen for sleep. Dr Myna Hidalgo continues to treat her for polycythemia. We again discussed her history of CVA and questionable diagnosis of MS. Swallowing evaluation (August 2012/filed under imaging) describes esophageal dysphagia and recommended reflux precautions but did not see aspiration, reflux or penetration. She coughed throughout the procedure. ++ Consider allergy profile next visit++  Review of Systems- See HPI Constitutional:   No-   weight loss, night sweats, fevers, chills, fatigue, lassitude. HEENT:   frequent  headaches,  Some difficulty swallowing,, sore throat,       No-  sneezing, itching, ear ache, nasal congestion, post nasal drip,  CV:  No-   chest pain, orthopnea, PND, swelling in lower extremities, anasarca, dizziness, palpitations Resp: + shortness of breath with exertion or at rest.                No-  coughing up of blood.              No-   change in color of mucus.  No- wheezing.   Skin: No-   rash or lesions. GI:  No-   heartburn, indigestion, abdominal pain, nausea, vomiting, GU:  MS:  No-   joint pain or swelling.   Neuro- nothing new or unusual  Psych:  No- change in mood or affect. No depression or anxiety.  No memory loss.    Objective:   Physical Exam General- Alert, Oriented, Affect-appropriate, Distress- none acute,  overweight Skin- rash-none, lesions- none, excoriation- none Lymphadenopathy- none Head- atraumatic            Eyes- Gross vision intact, PERRLA, conjunctivae clear secretions            Ears- Hearing, canals            Nose- Clear, No- Septal dev, mucus, polyps, erosion, perforation              Throat- Mallampati III , mucosa clear , drainage- none, tonsils- atrophic    Speech is dysphonic, hoarse, hesitant Neck- flexible , trachea midline, no stridor , thyroid nl, carotid no bruit Chest - symmetrical excursion , unlabored           Heart/CV- RRR , no murmur , no gallop  , no rub, nl s1 s2                           - JVD- none , edema- none, stasis changes- none, varices- none           Lung- clear to P&A, wheeze- none, +cough- active dry cough , dullness-none, rub- none, unlabored           Chest wall- Vascular surgery scar Right Upper Anterior chest Abd- Br/ Gen/ Rectal- Not done, not indicated Extrem- cyanosis- none, clubbing, none, atrophy- none, strength- . Rolling walker Neuro- grossly intact to observation

## 2012-04-25 ENCOUNTER — Encounter: Payer: Self-pay | Admitting: Internal Medicine

## 2012-04-25 NOTE — Assessment & Plan Note (Signed)
We reviewed the significance of her swallowing evaluation and reviewed reflux precautions.

## 2012-04-25 NOTE — Assessment & Plan Note (Signed)
Continue supplemental oxygen at night attempting to minimize any hypoxic stimulus to her bone marrow.

## 2012-04-25 NOTE — Assessment & Plan Note (Signed)
Her husband has made significant additions to the heating and air systems in the home and she stays in to avoid pollen. It's hard to believe that there is a significant environmental allergy component at this time. I don't find pertinent lab in accessible lab results and the current system.  Plan-consider allergy profile next visit.

## 2012-04-25 NOTE — Assessment & Plan Note (Signed)
Plan-sample Spiriva with discussion, sample Arcapta with discussion. Compare these two used separately.

## 2012-05-08 ENCOUNTER — Telehealth: Payer: Self-pay | Admitting: Internal Medicine

## 2012-05-08 NOTE — Telephone Encounter (Signed)
ATC x 2 line was busy, WCB 

## 2012-05-08 NOTE — Telephone Encounter (Signed)
ATC again and line still busy

## 2012-05-11 MED ORDER — TIOTROPIUM BROMIDE MONOHYDRATE 18 MCG IN CAPS: 18.0000 ug | ORAL_CAPSULE | Freq: Every day | RESPIRATORY_TRACT | Status: DC

## 2012-05-11 NOTE — Telephone Encounter (Signed)
Pt states she has seen improvement since using spiriva. Cough has improved. Arcapta did not help any and she does not wish to continue with it. She is asking that we write RX for 90 day supply with refills so she can get this through [facility] and then she will also need a 30 day supply for [facility] until she receives her mail order.  Pls advise. Allergies  Allergen Reactions  . Chlorzoxazone   . Epinephrine     Raises heart rate  . Lamotrigine   . Levofloxacin   . Neurontin (Gabapentin)     arthralgia  . Protriptyline Hcl     Severe constipation  . Ropinirole Hcl     arthralgia  . Tizanidine     panic  . Verapamil   . Zonegran     Mood swings  . Advair Diskus (Fluticasone-Salmeterol) Rash  . Baclofen Rash  . Chlorzoxazone Rash  . Levofloxacin Rash

## 2012-05-11 NOTE — Telephone Encounter (Signed)
LMTCB

## 2012-05-11 NOTE — Telephone Encounter (Signed)
Patient spouse aware RX is ready for pick up and he will come by in the morning to pick this up. 30 day supply sent to [facility].

## 2012-05-11 NOTE — Telephone Encounter (Signed)
Per CY---ok to send in spiriva #90  1 daily and refillx 3 for [facility]  and spiriva #30 1 daily ref prn for [facility].  thanks

## 2012-05-14 ENCOUNTER — Ambulatory Visit (HOSPITAL_BASED_OUTPATIENT_CLINIC_OR_DEPARTMENT_OTHER): Payer: Medicare Other

## 2012-05-14 ENCOUNTER — Ambulatory Visit (HOSPITAL_BASED_OUTPATIENT_CLINIC_OR_DEPARTMENT_OTHER): Payer: Medicare Other | Admitting: Hematology & Oncology

## 2012-05-14 ENCOUNTER — Other Ambulatory Visit (HOSPITAL_BASED_OUTPATIENT_CLINIC_OR_DEPARTMENT_OTHER): Payer: Medicare Other | Admitting: Lab

## 2012-05-14 VITALS — BP 107/76 | HR 78

## 2012-05-14 VITALS — BP 128/80 | HR 86 | Temp 97.1°F | Ht 67.0 in | Wt 211.0 lb

## 2012-05-14 DIAGNOSIS — D45 Polycythemia vera: Secondary | ICD-10-CM

## 2012-05-14 LAB — CBC WITH DIFFERENTIAL (CANCER CENTER ONLY)
BASO#: 0 10*3/uL (ref 0.0–0.2)
HCT: 39.3 % (ref 34.8–46.6)
HGB: 12.5 g/dL (ref 11.6–15.9)
LYMPH#: 1.6 10*3/uL (ref 0.9–3.3)
MONO#: 0.6 10*3/uL (ref 0.1–0.9)
NEUT%: 71 % (ref 39.6–80.0)
WBC: 8 10*3/uL (ref 3.9–10.0)

## 2012-05-14 NOTE — Progress Notes (Signed)
Natalie Hendrix presents today for phlebotomy per MD orders. Phlebotomy procedure started at 1520 and ended at 1540. 500 grams removed. Patient observed for 30 minutes after procedure without any incident. Patient tolerated procedure well. IV needle removed intact.

## 2012-05-14 NOTE — Progress Notes (Signed)
This office note has been dictated.

## 2012-05-15 NOTE — Progress Notes (Signed)
CC:   Francis P. Modesto Charon, M.D. Natalie Hendrix, M.D. Kathrin Penner. Vear Clock, M.D. Barbette Hair. Arlyce Dice, MD,FACG Clinton D. Young, MD, FCCP, FACP  DIAGNOSIS:  Polycythemia vera, JAK2 negative.  CURRENT THERAPY: 1. Phlebotomy to maintain hematocrit less than 38%. 2. Plavix 75 mg p.o. daily.  INTERIM HISTORY:  [name] comes in for followup.  She is doing okay. She feels a little bit better.  She has had no problems falling.  She has a new rolling walker, which has helped her out quite a bit.  She has had no headache.  There is no double vision or blurred vision. She has had no nausea or vomiting.  There has been no bleeding.  We clearly have made her iron deficient.  Her iron studies have shown her ferritin to be 3 back in April.  PHYSICAL EXAMINATION:  General:  This is a well-developed, well- nourished white female in no obvious distress.  Vital Signs: Temperature 97.1, pulse 86, respiratory rate 18, blood pressure 128/80, weight is 211 pounds.  Head and Neck Exam:  Shows a normocephalic, atraumatic skull.  There are no ocular or oral lesions.  There are no palpable cervical or supraclavicular lymph nodes.  Lungs:  Clear to percussion and auscultation bilaterally.  Cardiac Exam:  Regular rate and rhythm with a normal S1 and S2.  There are no murmurs, rubs, or bruits.  Abdominal Exam:  Soft with good bowel sounds.  There is no palpable abdominal mass.  There is no palpable hepatosplenomegaly.  Back Exam:  No tenderness of the spine, ribs, or hips.  Extremities:  Show no clubbing, cyanosis, or edema.  Neurological Exam:  Shows no focal neurological deficits.  Skin Exam:  No rashes, ecchymoses, or petechia.  LABORATORY STUDIES:  White cell count is 8, hemoglobin 12.5, hematocrit 39.3, platelet count 307.  MCV is 73.  IMPRESSION:  [name] is a 61 year old female with polycythemia vera. She is doing okay right now.  Her hematocrit is above 38%.  This is her cut-off for phlebotomizing.  As  such, we will take off 1 unit of blood.  We will have her back to see [place] in 2 months.  She has not been phlebotomized for a good year or more.  I am just glad to see that she is maintaining herself.    ______________________________ Josph Macho, M.D. PRE/MEDQ  D:  05/14/2012  T:  05/15/2012  Job:  [id number]

## 2012-06-17 ENCOUNTER — Other Ambulatory Visit: Payer: Self-pay | Admitting: *Deleted

## 2012-06-17 ENCOUNTER — Telehealth: Payer: Self-pay | Admitting: Internal Medicine

## 2012-06-17 NOTE — Telephone Encounter (Signed)
Updated med list

## 2012-06-24 ENCOUNTER — Other Ambulatory Visit (HOSPITAL_COMMUNITY): Payer: Self-pay | Admitting: Obstetrics & Gynecology

## 2012-06-24 DIAGNOSIS — Z139 Encounter for screening, unspecified: Secondary | ICD-10-CM

## 2012-07-16 ENCOUNTER — Ambulatory Visit: Payer: Medicare Other | Admitting: Hematology & Oncology

## 2012-07-16 ENCOUNTER — Ambulatory Visit: Payer: Medicare Other

## 2012-07-16 ENCOUNTER — Other Ambulatory Visit: Payer: Medicare Other | Admitting: Lab

## 2012-07-16 ENCOUNTER — Other Ambulatory Visit (HOSPITAL_BASED_OUTPATIENT_CLINIC_OR_DEPARTMENT_OTHER): Payer: Medicare Other | Admitting: Lab

## 2012-07-16 VITALS — BP 117/81 | HR 87 | Temp 98.3°F | Resp 24 | Ht 67.0 in | Wt 208.0 lb

## 2012-07-16 DIAGNOSIS — D45 Polycythemia vera: Secondary | ICD-10-CM

## 2012-07-16 DIAGNOSIS — I639 Cerebral infarction, unspecified: Secondary | ICD-10-CM

## 2012-07-16 LAB — CBC WITH DIFFERENTIAL (CANCER CENTER ONLY)
BASO%: 0.7 % (ref 0.0–2.0)
EOS%: 2 % (ref 0.0–7.0)
LYMPH%: 33.6 % (ref 14.0–48.0)
MCHC: 30.5 g/dL — ABNORMAL LOW (ref 32.0–36.0)
MCV: 72 fL — ABNORMAL LOW (ref 81–101)
MONO#: 0.7 10*3/uL (ref 0.1–0.9)
MONO%: 12 % (ref 0.0–13.0)
NEUT%: 51.7 % (ref 39.6–80.0)
Platelets: 301 10*3/uL (ref 145–400)
RDW: 15.8 % — ABNORMAL HIGH (ref 11.1–15.7)
WBC: 5.5 10*3/uL (ref 3.9–10.0)

## 2012-07-16 MED ORDER — CLOPIDOGREL BISULFATE 75 MG PO TABS: 75.0000 mg | ORAL_TABLET | Freq: Every day | ORAL | Status: DC

## 2012-07-16 NOTE — Progress Notes (Signed)
07/16/2012 Patient seen by Dr. Myna Hidalgo, no phlebotomy indicated per Dr. Myna Hidalgo. Teola Bradley, Jermani Eberlein [employer]

## 2012-07-27 ENCOUNTER — Other Ambulatory Visit (HOSPITAL_COMMUNITY): Payer: Self-pay | Admitting: Obstetrics and Gynecology

## 2012-07-27 DIAGNOSIS — Z780 Asymptomatic menopausal state: Secondary | ICD-10-CM

## 2012-08-10 ENCOUNTER — Ambulatory Visit (HOSPITAL_COMMUNITY)
Admission: RE | Admit: 2012-08-10 | Discharge: 2012-08-10 | Disposition: A | Discharge status: Home / Self Care | Payer: Medicare Other | Source: Ambulatory Visit | Attending: Obstetrics & Gynecology | Admitting: Obstetrics & Gynecology

## 2012-08-10 ENCOUNTER — Ambulatory Visit (HOSPITAL_COMMUNITY)
Admission: RE | Admit: 2012-08-10 | Discharge: 2012-08-10 | Disposition: A | Discharge status: Home / Self Care | Payer: Medicare Other | Source: Ambulatory Visit | Attending: Obstetrics and Gynecology | Admitting: Obstetrics and Gynecology

## 2012-08-10 DIAGNOSIS — Z1231 Encounter for screening mammogram for malignant neoplasm of breast: Secondary | ICD-10-CM | POA: Insufficient documentation

## 2012-08-10 DIAGNOSIS — Z139 Encounter for screening, unspecified: Secondary | ICD-10-CM

## 2012-08-10 DIAGNOSIS — Z780 Asymptomatic menopausal state: Secondary | ICD-10-CM

## 2012-08-11 ENCOUNTER — Telehealth: Payer: Self-pay | Admitting: Internal Medicine

## 2012-08-11 NOTE — Telephone Encounter (Signed)
I don't see patients coming in with similar problems on these meds. Can't rule out some medication side effects, but suspect viral URI and seasonal fall pollen allergy are part of it.  Best course would be to stop both meds and wait to see if she is better off with or without.

## 2012-08-11 NOTE — Telephone Encounter (Signed)
Called, spoke with pt who has 2 concerns for Dr. Maple Hudson:  1.  Has noticed a brown staining around her gum line.  Has taken several measures that has been recommended by her dentist including whiting treatment but nothing has worked.  Would like to know if qvar or spiriva could be contributing to this.  Pls advise.  Thank you. 2.  Has noticed an increase in HA, laryngitis, URI, rhinitis, increased asthma symptoms, sinusitis, back pain, nausea, and dysponia since starting qvar and spiriva.  Has read these could be adverse reactions to spiriva and qvar.  Would like to know any recs Dr. Maple Hudson has regarding this.  Pls advise.  Thank you.    Last Ov with CDY 04/20/12 Pending OV with CDY 10/20/2012  [facility]  Allergies  Allergen Reactions  . Chlorzoxazone   . Epinephrine     Raises heart rate  . Lamotrigine   . Levofloxacin   . Neurontin (Gabapentin)     arthralgia  . Protriptyline Hcl     Severe constipation  . Ropinirole Hcl     arthralgia  . Tizanidine     panic  . Verapamil   . Zonegran     Mood swings  . Advair Diskus (Fluticasone-Salmeterol) Rash  . Baclofen Rash  . Chlorzoxazone Rash  . Levofloxacin Rash

## 2012-08-12 ENCOUNTER — Other Ambulatory Visit: Payer: Self-pay | Admitting: *Deleted

## 2012-08-12 NOTE — Telephone Encounter (Signed)
Spoke with pt and notified of recs per CDY She verbalized understanding and states nothing further needed 

## 2012-09-17 ENCOUNTER — Ambulatory Visit (HOSPITAL_BASED_OUTPATIENT_CLINIC_OR_DEPARTMENT_OTHER): Payer: Medicare Other | Admitting: Hematology & Oncology

## 2012-09-17 ENCOUNTER — Other Ambulatory Visit (HOSPITAL_BASED_OUTPATIENT_CLINIC_OR_DEPARTMENT_OTHER): Payer: Medicare Other | Admitting: Lab

## 2012-09-17 VITALS — BP 116/66 | HR 78 | Temp 98.1°F | Resp 14 | Ht 67.0 in | Wt 201.0 lb

## 2012-09-17 DIAGNOSIS — D45 Polycythemia vera: Secondary | ICD-10-CM

## 2012-09-17 LAB — CBC WITH DIFFERENTIAL (CANCER CENTER ONLY)
BASO#: 0 10*3/uL (ref 0.0–0.2)
EOS%: 1.7 % (ref 0.0–7.0)
Eosinophils Absolute: 0.1 10*3/uL (ref 0.0–0.5)
HCT: 34.9 % (ref 34.8–46.6)
HGB: 10.7 g/dL — ABNORMAL LOW (ref 11.6–15.9)
LYMPH%: 26.7 % (ref 14.0–48.0)
MCH: 21 pg — ABNORMAL LOW (ref 26.0–34.0)
MCHC: 30.7 g/dL — ABNORMAL LOW (ref 32.0–36.0)
MCV: 69 fL — ABNORMAL LOW (ref 81–101)
MONO%: 8.5 % (ref 0.0–13.0)
NEUT%: 62.6 % (ref 39.6–80.0)
RBC: 5.09 10*6/uL (ref 3.70–5.32)

## 2012-09-17 LAB — IRON AND TIBC
%SAT: 5 % — ABNORMAL LOW (ref 20–55)
UIBC: 508 ug/dL — ABNORMAL HIGH (ref 125–400)

## 2012-09-17 LAB — COMPREHENSIVE METABOLIC PANEL
Albumin: 4.1 g/dL (ref 3.5–5.2)
Alkaline Phosphatase: 65 U/L (ref 39–117)
BUN: 12 mg/dL (ref 6–23)
Calcium: 9.2 mg/dL (ref 8.4–10.5)
Chloride: 108 mEq/L (ref 96–112)
Creatinine, Ser: 0.72 mg/dL (ref 0.50–1.10)
Glucose, Bld: 88 mg/dL (ref 70–99)
Potassium: 4.5 mEq/L (ref 3.5–5.3)
Sodium: 140 mEq/L (ref 135–145)
Total Protein: 6.4 g/dL (ref 6.0–8.3)

## 2012-09-17 LAB — RETICULOCYTES (CHCC)
ABS Retic: 62.2 10*3/uL (ref 19.0–186.0)
RBC.: 5.18 MIL/uL — ABNORMAL HIGH (ref 3.87–5.11)
Retic Ct Pct: 1.2 % (ref 0.4–2.3)

## 2012-09-17 NOTE — Progress Notes (Signed)
This office note has been dictated.

## 2012-09-18 NOTE — Progress Notes (Signed)
CC:   Clinton D. Young, MD, FCCP, FACP Francis P. Modesto Charon, M.D. Genene Churn. Love, M.D. Kathrin Penner. Vear Clock, M.D.  DIAGNOSIS:  Polycythemia vera-JAK2 negative.  CURRENT THERAPY: 1. Phlebotomy to maintain hematocrit less than 38%. 2. Plavix 75 mg p.o. daily.  INTERIM HISTORY:  Ms. Rudden comes in for follow-up.  She is having a little bit more difficulty.  She is oxygen all the time now.  I still cannot understand why she is on oxygen all the time.  She has never smoked.  There is no obvious occupational exposure as far as I can tell. She has had pneumonia in the past.  Again, she is followed by Dr. Maple Hudson.  I do not notice any problems with aspiration.  There is some secondary tobacco exposure from her family.  As far as her polycythemia goes, this has really not been an issue for her.  She has had some falling episodes.  This was probably from a past CVA that she had.  She has had no abdominal pain.  There is no problem with bowels or bladder.  She has had no leg swelling.  There have been no rashes.  PHYSICAL EXAMINATION:  General:  This is a well-developed, well- nourished white female in no obvious distress.  Vital signs: 98.1, pulse 78, respiratory rate 14, blood pressure 116/66.  Weight is 201.  Head and neck:  Normocephalic, atraumatic skull.  There are no ocular or oral lesions.  There are no palpable cervical or supraclavicular lymph nodes. Lungs:  Clear bilaterally.  There are no rales, wheezes, or rhonchi. Abdomen:  Soft with good bowel sounds.  There is no palpable abdominal mass.  There is no palpable hepatosplenomegaly.  Extremities:  No clubbing, cyanosis, or edema.  She has decent strength bilaterally. Neurological:  No obvious neurological deficits.  LABORATORY STUDIES:  White cell count is 5.9, hemoglobin 10.7, hematocrit 34.9, platelet count 286.  MCV is 69.  IMPRESSION:  Ms. Wauters is a very nice 61 year old white female with polycythemia.  We are being very  aggressive with her phlebotomy requirements.  Again, she does not need to be phlebotomized today.  I still do not understand her pulmonary situation.  Dr. Maple Hudson is totally on top of this, however, and I am sure that he will get this all sorted out.  I do not know if she is ultimately going to need to have some kind of lung biopsy to figure out what might be going on.  We will plan to get her back to see [place] in another 6 weeks or so.    ______________________________ Josph Macho, M.D. PRE/MEDQ  D:  09/17/2012  T:  09/18/2012  Job:  [id number]

## 2012-09-21 ENCOUNTER — Telehealth: Payer: Self-pay | Admitting: *Deleted

## 2012-09-21 NOTE — Telephone Encounter (Addendum)
Message copied by Mirian Capuchin on Mon Sep 21, 2012 11:19 AM ------      Message from: Arlan Organ R      Created: Fri Sep 18, 2012  5:20 PM       Please call and tell her that her labs and iron and liver tests are normal. Thanks. Cindee Lame This message given to pt.'s husband who voiced understanding.

## 2012-09-23 ENCOUNTER — Other Ambulatory Visit: Payer: Self-pay | Admitting: Neurology

## 2012-09-23 DIAGNOSIS — R55 Syncope and collapse: Secondary | ICD-10-CM

## 2012-09-23 DIAGNOSIS — F0390 Unspecified dementia without behavioral disturbance: Secondary | ICD-10-CM

## 2012-09-23 DIAGNOSIS — I951 Orthostatic hypotension: Secondary | ICD-10-CM

## 2012-09-28 ENCOUNTER — Ambulatory Visit
Admission: RE | Admit: 2012-09-28 | Discharge: 2012-09-28 | Disposition: A | Discharge status: Home / Self Care | Payer: Medicare Other | Source: Ambulatory Visit | Attending: Neurology | Admitting: Neurology

## 2012-09-28 DIAGNOSIS — R55 Syncope and collapse: Secondary | ICD-10-CM

## 2012-09-28 DIAGNOSIS — F0390 Unspecified dementia without behavioral disturbance: Secondary | ICD-10-CM

## 2012-09-28 DIAGNOSIS — I951 Orthostatic hypotension: Secondary | ICD-10-CM

## 2012-10-01 ENCOUNTER — Other Ambulatory Visit: Payer: Self-pay | Admitting: Neurology

## 2012-10-01 DIAGNOSIS — F0390 Unspecified dementia without behavioral disturbance: Secondary | ICD-10-CM

## 2012-10-01 DIAGNOSIS — R42 Dizziness and giddiness: Secondary | ICD-10-CM

## 2012-10-10 ENCOUNTER — Other Ambulatory Visit: Payer: Medicare Other

## 2012-10-16 ENCOUNTER — Ambulatory Visit
Admission: RE | Admit: 2012-10-16 | Discharge: 2012-10-16 | Disposition: A | Discharge status: Home / Self Care | Payer: Medicare Other | Source: Ambulatory Visit | Attending: Neurology | Admitting: Neurology

## 2012-10-16 DIAGNOSIS — F0390 Unspecified dementia without behavioral disturbance: Secondary | ICD-10-CM

## 2012-10-16 DIAGNOSIS — R42 Dizziness and giddiness: Secondary | ICD-10-CM

## 2012-10-16 MED ORDER — GADOBENATE DIMEGLUMINE 529 MG/ML IV SOLN
20.0000 mL | Freq: Once | INTRAVENOUS | Status: AC | PRN
  Administered 2012-10-16: 20 mL via INTRAVENOUS

## 2012-10-20 ENCOUNTER — Ambulatory Visit (INDEPENDENT_AMBULATORY_CARE_PROVIDER_SITE_OTHER): Payer: Medicare Other | Admitting: Internal Medicine

## 2012-10-20 ENCOUNTER — Encounter: Payer: Self-pay | Admitting: Internal Medicine

## 2012-10-20 ENCOUNTER — Other Ambulatory Visit: Payer: Medicare Other

## 2012-10-20 VITALS — BP 116/60 | HR 84 | Ht 67.0 in | Wt 207.4 lb

## 2012-10-20 DIAGNOSIS — J45909 Unspecified asthma, uncomplicated: Secondary | ICD-10-CM

## 2012-10-20 DIAGNOSIS — J309 Allergic rhinitis, unspecified: Secondary | ICD-10-CM

## 2012-10-20 DIAGNOSIS — J45998 Other asthma: Secondary | ICD-10-CM

## 2012-10-20 MED ORDER — AZELASTINE HCL 0.1 % NA SOLN: 1.0000 | Freq: Two times a day (BID) | NASAL | Status: DC

## 2012-10-20 MED ORDER — BECLOMETHASONE DIPROP MONOHYD 42 MCG/SPRAY NA SUSP: 2.0000 | Freq: Two times a day (BID) | NASAL | Status: DC

## 2012-10-20 NOTE — Progress Notes (Signed)
Subjective:   07/22/11- 37 yoF here on kind referral by Dr. Ernst [place] because of asthma and history of pneumonia with cough. Husband here. She was a patient of mine in the [place] at the old practice. She gives a history of asthma and bronchitis. Upper respiratory problems have been more evident since November of 2011. Coughs easily while eating. Coughs each morning bringing up small pellets of green. Wakes nauseated in the morning until she coughs her chest clear. Nebulizer treatments at her doctors for this has seemed very helpful and she would like to have a machine at home. Wears a mask outside and finds she is much bothered by strong odors heat and humidity. She is using Qvar 40 twice daily but blames that for burning in her mouth despite aggressive anti-thrush management. Using Beconase AQ nasal spray, a rescue inhaler once or twice daily and Tessalon pearls.  She feels distinctly better indoors where her husband has installed an extensive air filter system as well as individual room air cleaners in several rooms. She has encasings on bending and they are careful about dust control. Allergy skin testing in the past was positive without allergy vaccine. She has a history of polycythemia vera followed by Dr. Marin Olp with phlebotomy. Polycythemia is planned for cerebrovascular accident which has left her with cognitive and speech difficulties. She also has history of irregular heartbeat on Toprol. Pneumonia in 1978. No history of deep vein thrombosis or pulmonary embolism. Pneumococcal vaccine twice. Flu vaccine last year with blamed recurrent episodes of bronchitis during the winter. Thoracic steal syndrome was addressed with vascular reconstruction on the right in the 1980s. Chest x-ray report from 06/12/2011 described minimal right basilar atelectasis with no acute infiltrate identified, improved from earlier film. Never smoked. Family history a sister with severe allergy problems, and  with COPD, father worked with asbestos exposure and was a heavy smoker   08/23/11- 52 yoF never smoker, asthma and history of pneumonia with cough, complicated by past history surgery for thoracic outlet syndrome, CVA, allergic rhinitis, polycythemia. After last visit August 20, I have some concern that what she was describing was related to upper airway control after her stroke. She comes today for followup of lab work. ONOX-oxygen desaturation equal or less than 89% on room air for 15.4 minutes, less than or equal to 88% for 9.9 minutes, and lowest recorded saturation 82% on 07/29/2011. This could justify home oxygen for sleep. PFT-08/01/2011-normal baseline spirometry with some small airway response to bronchodilator, normal lung volumes, diffusion moderately reduced. FEV1/FVC 0.85, TLC 96%, DLCO 62%. Alpha I antitrypsin assessment: Normal MM. Speech therapy modified barium swallow was performed 08/01/2011 but I do not yet have the therapist report. I have counseled her care with swallowing, careful chewing and swallowing, sitting upright etc.  12/27/11-  20 yoF never smoker, asthma and history of pneumonia with cough, complicated by past history surgery for thoracic outlet syndrome, CVA, allergic rhinitis, polycythemia. Husband here  PCP Dr Jacelyn Grip After overnight oximetry she qualified for home oxygen during sleep at 2 L per minute/[facility]. They have an electrical generator and her husband is getting a backup generator. She chokes occasionally eating without obvious reflux. We discussed dysphagia and aspiration risk. Speech therapist report describes esophageal dysphagia without aspiration. A copy of this report will be forwarded to Dr. Jacelyn Grip who might consider speech therapy for swallowing training. Dr Marin Olp gave prednisone for polycythemia.  04/20/12- 39 yoF never smoker, asthma and history of pneumonia with cough, complicated by  past history surgery for thoracic outlet syndrome, CVA, allergic  rhinitis, polycythemia. Husband here    PCP Dr Modesto Charon  Husband is here  PT STATES INCREASE SOB,WHEEZING,DUE TO SEASON. HAS A DRY HACKY COUGH  She is blaming bad weather for her increased cough. They put a big air cleaner system in the home. She has been staying indoors to avoid pollen. Benzonatate helps her chronic cough. Nebulizer helps her wheezing.  Wears oxygen for sleep. Dr Myna Hidalgo continues to treat her for polycythemia. We again discussed her history of CVA and questionable diagnosis of MS. Swallowing evaluation (August 2012/filed under imaging) describes esophageal dysphagia and recommended reflux precautions but did not see aspiration, reflux or penetration. She coughed throughout the procedure. ++ Consider allergy profile next visit++  10/20/12- 60 yoF never smoker, asthma and history of pneumonia with cough, complicated by past history surgery for thoracic outlet syndrome, CVA, allergic rhinitis, polycythemia. Husband here    PCP Dr Modesto Charon  Husband is here FOLLOWS FOR: doing well as long as sticking with "plan of care".  Had flu vaccine. Blames either Spiriva or Qvar for burning feeling on her tongue and problems with her teeth. Stopped both and doing better.  She is sensitive to weather change. Has morning cough with clear or tan of mucus. Evaluated for degenerative disc disease and syncopal episodes.  Review of Systems- See HPI Constitutional:   No-   weight loss, night sweats, fevers, chills, fatigue, lassitude. HEENT:   frequent  headaches,  Some difficulty swallowing,, sore throat,       No-  sneezing, itching, ear ache, +nasal congestion, post nasal drip,  CV:  No-   chest pain, orthopnea, PND, swelling in lower extremities, anasarca, dizziness, palpitations Resp: + shortness of breath with exertion or at rest.                No-  coughing up of blood.              +   change in color of mucus.  No- wheezing.   Skin: No-   rash or lesions. GI:  No-   heartburn, indigestion,  abdominal pain, nausea, vomiting, GU:  MS:  No-   joint pain or swelling.   Neuro- nothing new or unusual  Psych:  No- change in mood or affect. No depression or anxiety.  No memory loss.    Objective:   Physical Exam BP 116/60  Pulse 84  Ht 5\' 7"  (1.702 m)  Wt 207 lb 6.4 oz (94.076 kg)  BMI 32.48 kg/m2  SpO2 96% General- Alert, Oriented, Affect-appropriate, Distress- none acute,  Overweight. O2 2L/ [facility] Skin- rash-none, lesions- none, excoriation- none Lymphadenopathy- none Head- atraumatic            Eyes- Gross vision intact, PERRLA, conjunctivae clear secretions            Ears- Hearing, canals            Nose- Clear, No- Septal dev, mucus, polyps, erosion, perforation             Throat- Mallampati III , mucosa clear , drainage- none, tonsils- atrophic    Speech is dysphonic, hoarse, hesitant Neck- flexible , trachea midline, no stridor , thyroid nl, carotid no bruit Chest - symmetrical excursion , unlabored           Heart/CV- RRR , no murmur , no gallop  , no rub, nl s1 s2                           -  JVD- none , edema- none, stasis changes- none, varices- none           Lung- clear to P&A, wheeze- none, +cough dry cough , dullness-none, rub- none, unlabored           Chest wall- Vascular surgery scar Right Upper Anterior chest Abd- Br/ Gen/ Rectal- Not done, not indicated Extrem- cyanosis- none, clubbing, none, atrophy- none, strength- . Rolling walker Neuro- grossly intact to observation

## 2012-10-20 NOTE — Patient Instructions (Addendum)
Order - lab allergy profile  Dx  Asthma with bronchitis  Refill scripts for Beconase and Astelin

## 2012-10-21 LAB — ALLERGY FULL PROFILE
Alternaria Alternata: <0.1 kU/L
Aspergillus fumigatus, IgG: <0.1 kU/L
Bahia Grass: <0.1 kU/L
Bermuda Grass: <0.1 kU/L
Box Elder IgE: <0.1 kU/L
Common Ragweed: <0.1 kU/L
D. farinae: <0.1 kU/L
Fescue: <0.1 kU/L
G005 Rye, Perennial: <0.1 kU/L
Goldenrod: <0.1 kU/L
Helminthosporium halodes: <0.1 kU/L
House Dust Hollister: <0.1 kU/L
IgE (Immunoglobulin E), Serum: 4.9 IU/mL (ref 0.0–180.0)
Lamb's Quarters: <0.1 kU/L
Plantain: <0.1 kU/L
Stemphylium Botryosum: <0.1 kU/L
Sycamore Tree: <0.1 kU/L

## 2012-10-28 NOTE — Assessment & Plan Note (Signed)
Plan-scripts for Astelin and Beconase AQ Order lab allergy profile

## 2012-10-28 NOTE — Assessment & Plan Note (Signed)
There is a bronchitis component. He thought either Qvar or Spiriva irritating his mouth. Suspect this was early thrush.

## 2012-10-30 ENCOUNTER — Ambulatory Visit: Payer: Medicare Other | Admitting: Hematology & Oncology

## 2012-10-30 ENCOUNTER — Other Ambulatory Visit: Payer: Medicare Other | Admitting: Lab

## 2012-11-03 ENCOUNTER — Other Ambulatory Visit (HOSPITAL_BASED_OUTPATIENT_CLINIC_OR_DEPARTMENT_OTHER): Payer: Medicare Other | Admitting: Lab

## 2012-11-03 ENCOUNTER — Ambulatory Visit (HOSPITAL_BASED_OUTPATIENT_CLINIC_OR_DEPARTMENT_OTHER): Payer: Medicare Other | Admitting: Hematology & Oncology

## 2012-11-03 VITALS — BP 139/57 | HR 88 | Temp 97.5°F | Resp 20 | Ht 67.0 in | Wt 206.0 lb

## 2012-11-03 DIAGNOSIS — D45 Polycythemia vera: Secondary | ICD-10-CM

## 2012-11-03 LAB — CMP (CANCER CENTER ONLY)
AST: 26 U/L (ref 11–38)
Alkaline Phosphatase: 76 U/L (ref 26–84)
BUN, Bld: 11 mg/dL (ref 7–22)
Glucose, Bld: 98 mg/dL (ref 73–118)
Potassium: 4.1 mEq/L (ref 3.3–4.7)
Sodium: 145 mEq/L (ref 128–145)
Total Bilirubin: 0.7 mg/dl (ref 0.20–1.60)
Total Protein: 7.2 g/dL (ref 6.4–8.1)

## 2012-11-03 LAB — CBC WITH DIFFERENTIAL (CANCER CENTER ONLY)
BASO#: 0 10*3/uL (ref 0.0–0.2)
BASO%: 0.6 % (ref 0.0–2.0)
EOS%: 2.4 % (ref 0.0–7.0)
HGB: 11.5 g/dL — ABNORMAL LOW (ref 11.6–15.9)
LYMPH#: 1.3 10*3/uL (ref 0.9–3.3)
MCH: 21.5 pg — ABNORMAL LOW (ref 26.0–34.0)
MCHC: 30.7 g/dL — ABNORMAL LOW (ref 32.0–36.0)
MONO%: 10.1 % (ref 0.0–13.0)
NEUT#: 3.4 10*3/uL (ref 1.5–6.5)
RDW: 19.1 % — ABNORMAL HIGH (ref 11.1–15.7)

## 2012-11-03 NOTE — Progress Notes (Signed)
This office note has been dictated.

## 2012-11-04 NOTE — Progress Notes (Signed)
CC:   Genene Churn. Love, M.D. Francis P. Modesto Charon, M.D. Kathrin Penner. Vear Clock, M.D. Clinton D. Young, MD, FCCP, FACP  DIAGNOSIS:  Polycythemia vera, JAK2 negative.  CURRENT THERAPY: 1. Phlebotomy to maintain hematocrit less than 38%. 2. Plavix 75 mg p.o. daily.  INTERIM HISTORY:  Ms. Suchocki comes in for her followup.  She has been falling.  She has had an MRI of the brain and cervical spine.  MRI of the brain from what she says is okay.  There is no multiple sclerosis.  MRI of the cervical spine shows a bulging disk.  She sees Dr. Sandria Manly for this.  From my point of view, I do not see any problems if she does need to have surgery.  Her polycythemia is very well controlled.  Otherwise, she seems to be doing all right.  There is no abdominal pain. There is no leg swelling.  She has had no rashes.  She does wear oxygen on occasion.  Her dyspnea does not appear to be worsened.  PHYSICAL EXAMINATION:  This is a fairly well-developed, well-nourished white female in no obvious distress.  Vital signs: 97.5, pulse 88, respiratory rate 20, blood pressure 139/57.  Weight is 206.  Head and neck:  Normocephalic, atraumatic skull.  There are no ocular or oral lesions.  There are no palpable cervical or supraclavicular lymph nodes. Lungs:  Clear bilaterally.  Cardiac:  Regular rate and rhythm with a normal S1 and S2.  There are no murmurs, rubs or bruits.  Abdomen:  Soft with good bowel sounds.  There is no palpable abdominal mass.  There is no fluid wave.  There is no palpable hepatosplenomegaly.  Extremities: No clubbing, cyanosis or edema.  She has good range motion of her joints.  Neurologic:  No obvious neurological deficits.  LABORATORY STUDIES:  White cell count is 5.3, hemoglobin 11.5, hematocrit 37.5, platelet count 287.  Sodium 145, potassium 4.1, BUN 12, creatinine 0.7.  Calcium is 9.1 and albumin of 3.5.  IMPRESSION:  Ms. Chatwin is a 61 year old female with polycythemia.  She is doing well  from my point of view.  She is on Plavix.  She does not need to be phlebotomized.  Hopefully, this falling issue will be sorted out.  I will plan to get her back in 6 weeks' time.  I do not see any need for lab work in between visits.    ______________________________ Josph Macho, M.D. PRE/MEDQ  D:  11/03/2012  T:  11/04/2012  Job:  [id number]

## 2013-01-04 ENCOUNTER — Ambulatory Visit: Payer: Medicare Other | Admitting: Medical

## 2013-01-04 ENCOUNTER — Other Ambulatory Visit: Payer: Medicare Other | Admitting: Lab

## 2013-01-05 ENCOUNTER — Telehealth: Payer: Self-pay | Admitting: Hematology & Oncology

## 2013-01-05 NOTE — Telephone Encounter (Signed)
Per RN to cx 01/06/13 patient apt due to [place] Ana being sick.  01/06/13 apt was cx and resch by patient for 01/11/13

## 2013-01-06 ENCOUNTER — Ambulatory Visit: Payer: Medicare Other | Admitting: Medical

## 2013-01-06 ENCOUNTER — Other Ambulatory Visit: Payer: Medicare Other | Admitting: Lab

## 2013-01-11 ENCOUNTER — Ambulatory Visit (HOSPITAL_BASED_OUTPATIENT_CLINIC_OR_DEPARTMENT_OTHER): Payer: Medicare Other | Admitting: Hematology & Oncology

## 2013-01-11 ENCOUNTER — Other Ambulatory Visit: Payer: Medicare Other | Admitting: Lab

## 2013-01-11 ENCOUNTER — Ambulatory Visit: Payer: Medicare Other | Admitting: Medical

## 2013-01-11 ENCOUNTER — Other Ambulatory Visit (HOSPITAL_BASED_OUTPATIENT_CLINIC_OR_DEPARTMENT_OTHER): Payer: Medicare Other | Admitting: Lab

## 2013-01-11 VITALS — BP 96/55 | HR 78 | Temp 97.2°F | Resp 18 | Ht 67.0 in | Wt 203.0 lb

## 2013-01-11 DIAGNOSIS — D45 Polycythemia vera: Secondary | ICD-10-CM

## 2013-01-11 DIAGNOSIS — I639 Cerebral infarction, unspecified: Secondary | ICD-10-CM

## 2013-01-11 LAB — FERRITIN: Ferritin: 5 ng/mL — ABNORMAL LOW (ref 10–291)

## 2013-01-11 LAB — CBC WITH DIFFERENTIAL (CANCER CENTER ONLY)
BASO#: 0 10*3/uL (ref 0.0–0.2)
EOS%: 2.7 % (ref 0.0–7.0)
Eosinophils Absolute: 0.2 10*3/uL (ref 0.0–0.5)
HGB: 10.7 g/dL — ABNORMAL LOW (ref 11.6–15.9)
LYMPH%: 30.6 % (ref 14.0–48.0)
MCH: 21.6 pg — ABNORMAL LOW (ref 26.0–34.0)
MCHC: 30.6 g/dL — ABNORMAL LOW (ref 32.0–36.0)
MCV: 71 fL — ABNORMAL LOW (ref 81–101)
MONO%: 10.9 % (ref 0.0–13.0)
RBC: 4.95 10*6/uL (ref 3.70–5.32)

## 2013-01-11 LAB — COMPREHENSIVE METABOLIC PANEL
Alkaline Phosphatase: 62 U/L (ref 39–117)
BUN: 18 mg/dL (ref 6–23)
Creatinine, Ser: 0.87 mg/dL (ref 0.50–1.10)
Glucose, Bld: 96 mg/dL (ref 70–99)
Total Bilirubin: 0.4 mg/dL (ref 0.3–1.2)

## 2013-01-11 MED ORDER — CLOPIDOGREL BISULFATE 75 MG PO TABS: 75.0000 mg | ORAL_TABLET | Freq: Every day | ORAL | Status: DC

## 2013-01-11 NOTE — Progress Notes (Signed)
This office note has been dictated.

## 2013-01-12 NOTE — Progress Notes (Signed)
CC:   Francis P. Modesto Charon, M.D. Kathrin Penner. Vear Clock, M.D. Clinton D. Maple Hudson, MD, FCCP, Nadara Eaton. Love, M.D.  DIAGNOSIS:  Polycythemia vera, JAK2-negative.  CURRENT THERAPY: 1. Phlebotomy to maintain a hematocrit less than 38%. 2. Plavix 75 mg p.o. daily.  INTERIM HISTORY:  Ms. Aponte comes in for her followup.  She is feeling fairly well.  She has really had no specific complaints since we last saw her.  She has not had any problems with the flu.  There has been no nausea or vomiting.  She has not had any more neurological issues, which I am happy about for her.  She is still quite sad that her neurologist, Dr. Sandria Manly, is leaving.  He is retiring, which he definitely deserves to.  She has had no problems with bleeding or bruising.  There has been no leg swelling.  PHYSICAL EXAMINATION:  This is a well-developed, well-nourished white female in no obvious distress.  Vital signs:  Temperature of 97.2, pulse 78, respiratory rate 18, blood pressure 96/55.  Weight is 203. Head/Neck:  Normocephalic, atraumatic skull.  There are no ocular or oral lesions.  There are no palpable cervical or supraclavicular lymph nodes.  Lungs:  Clear bilaterally.  Cardiac:  Regular rate and rhythm with a normal S1 and S2.  There are no murmurs, rubs or bruits. Abdomen:  Soft with good bowel sounds.  There is no palpable abdominal mass.  There is no palpable hepatosplenomegaly.  Extremities:  No clubbing, cyanosis or edema.  She has good range of motion of her joints.  Neurological:  No focal neurological deficits.  LABORATORY STUDIES:  White cell count is 5.5, hemoglobin 10.7, hematocrit 35, platelet count 272.  MCV is 71.  IMPRESSION:  Ms. Carbary is a very nice 62 year old white female with polycythemia vera.  She does have neurological issues.  It is hard to say if they are from the polycythemia.  That is why we are being very aggressive with controlling the polycythemia.  For now, we will plan to get her  back in another couple of months.  I do not think we need any blood work in between visits.    ______________________________ Josph Macho, M.D. PRE/MEDQ  D:  01/11/2013  T:  01/12/2013  Job:  [id number]

## 2013-03-01 ENCOUNTER — Ambulatory Visit: Payer: Self-pay | Admitting: Family Medicine

## 2013-03-09 ENCOUNTER — Telehealth: Payer: Self-pay | Admitting: Family Medicine

## 2013-03-09 NOTE — Telephone Encounter (Signed)
5:31 pm line busy

## 2013-03-10 ENCOUNTER — Ambulatory Visit (HOSPITAL_BASED_OUTPATIENT_CLINIC_OR_DEPARTMENT_OTHER): Payer: Medicare Other | Admitting: Hematology & Oncology

## 2013-03-10 ENCOUNTER — Other Ambulatory Visit (HOSPITAL_BASED_OUTPATIENT_CLINIC_OR_DEPARTMENT_OTHER): Payer: Medicare Other | Admitting: Lab

## 2013-03-10 VITALS — BP 111/62 | HR 80 | Temp 98.3°F | Resp 16 | Ht 67.0 in | Wt 203.0 lb

## 2013-03-10 DIAGNOSIS — D45 Polycythemia vera: Secondary | ICD-10-CM

## 2013-03-10 LAB — CBC WITH DIFFERENTIAL (CANCER CENTER ONLY)
BASO%: 0.2 % (ref 0.0–2.0)
Eosinophils Absolute: 0.1 10*3/uL (ref 0.0–0.5)
LYMPH#: 1.1 10*3/uL (ref 0.9–3.3)
LYMPH%: 24.6 % (ref 14.0–48.0)
MCV: 72 fL — ABNORMAL LOW (ref 81–101)
MONO#: 0.4 10*3/uL (ref 0.1–0.9)
NEUT#: 2.9 10*3/uL (ref 1.5–6.5)
Platelets: 261 10*3/uL (ref 145–400)
RBC: 4.84 10*6/uL (ref 3.70–5.32)
RDW: 18.1 % — ABNORMAL HIGH (ref 11.1–15.7)
WBC: 4.6 10*3/uL (ref 3.9–10.0)

## 2013-03-10 LAB — FERRITIN: Ferritin: 4 ng/mL — ABNORMAL LOW (ref 10–291)

## 2013-03-10 NOTE — Progress Notes (Signed)
This office note has been dictated.

## 2013-03-12 NOTE — Progress Notes (Signed)
CC:   Francis P. Modesto Charon, M.D. Dineen Kid Rana Snare, M.D. Kathrin Penner. Vear Clock, M.D. Clinton D. Maple Hudson, MD, FCCP, FACP [facility]  DIAGNOSIS:  Polycythemia vera, JAK2 negative.  CURRENT THERAPY: 1. Phlebotomy to maintain hematocrit less than 38%. 2. Plavix 75 mg p.o. daily.  INTERIM HISTORY:  Ms. Lavery comes in for her followup.  She is doing okay.  She is still having her neurological issues.  Unfortunately, her neurologist retired.  I am not sure who she is seeing in his absence.  She is having some abdominal issues.  There is some epigastric discomfort.  She is having some nausea, but no vomiting.  She has had no bleeding.  Apparently there is a GYN issue that she needs to be seen for.  There has been no leg swelling.  There have been no rashes.  She has had no cough or shortness of breath.  She has had no fever, sweats, or chills.  She is clearly iron deficient at this point in time.  Her ferritin was 5 when I saw her in February.  PHYSICAL EXAMINATION:  General:  This is a well-developed, well- nourished white female in no obvious distress.  Vital signs: Temperature of 98.3, pulse 80, respiratory rate 16, blood pressure 111/62.  Weight is 203.  Head and neck:  Normocephalic, atraumatic skull.  There are no ocular or oral lesions.  There are no palpable cervical or supraclavicular lymph nodes.  Lungs:  Clear bilaterally. Cardiac:  Regular rate and rhythm, with a normal S1 and S2.  There are no murmurs, rubs, or bruits.  Abdomen:  Soft, with good bowel sounds. There is no fluid wave.  There may be some slight tenderness in the epigastric region to palpation.  There is no palpable hepatosplenomegaly.  Extremities:  Show no clubbing, cyanosis, or edema. She has good range motion of her joints.  Neurological:  Shows no focal neurological deficits.  LABORATORY STUDIES:  White cell count is 4.6, hemoglobin 10.8, hematocrit 34.6, platelet count 261,000.  MCV is  72.  IMPRESSION:  Ms. Claudio is a 62 year old white female with polycythemia. We are trying to manage her by keeping her hematocrit below 38%.  She does not need to be phlebotomized right now.  We will go ahead and plan to get her back in 2 more months.  I do not see any need for blood work in between visits.    ______________________________ Josph Macho, M.D. PRE/MEDQ  D:  03/10/2013  T:  03/11/2013  Job:  [id number]

## 2013-03-12 NOTE — Telephone Encounter (Signed)
Left message with spouse to have her call us if assistance still needed

## 2013-03-17 ENCOUNTER — Ambulatory Visit: Payer: Self-pay | Admitting: Family Medicine

## 2013-04-20 ENCOUNTER — Encounter: Payer: Self-pay | Admitting: Internal Medicine

## 2013-04-20 ENCOUNTER — Ambulatory Visit (INDEPENDENT_AMBULATORY_CARE_PROVIDER_SITE_OTHER): Payer: Medicare Other | Admitting: Internal Medicine

## 2013-04-20 VITALS — BP 100/62 | HR 88 | Ht 67.0 in | Wt 200.4 lb

## 2013-04-20 DIAGNOSIS — J45909 Unspecified asthma, uncomplicated: Secondary | ICD-10-CM

## 2013-04-20 DIAGNOSIS — J45998 Other asthma: Secondary | ICD-10-CM

## 2013-04-20 MED ORDER — BENZONATATE 100 MG PO CAPS: 100.0000 mg | ORAL_CAPSULE | Freq: Four times a day (QID) | ORAL | Status: DC | PRN

## 2013-04-20 MED ORDER — FEXOFENADINE HCL 180 MG PO TABS: 180.0000 mg | ORAL_TABLET | Freq: Every day | ORAL | Status: DC

## 2013-04-20 MED ORDER — BECLOMETHASONE DIPROP MONOHYD 42 MCG/SPRAY NA SUSP: 2.0000 | Freq: Two times a day (BID) | NASAL | Status: DC

## 2013-04-20 MED ORDER — ALBUTEROL SULFATE (2.5 MG/3ML) 0.083% IN NEBU: 2.5000 mg | INHALATION_SOLUTION | Freq: Four times a day (QID) | RESPIRATORY_TRACT | Status: DC | PRN

## 2013-04-20 MED ORDER — AZELASTINE HCL 0.1 % NA SOLN: 1.0000 | Freq: Two times a day (BID) | NASAL | Status: DC

## 2013-04-20 MED ORDER — ALBUTEROL SULFATE HFA 108 (90 BASE) MCG/ACT IN AERS: 2.0000 | INHALATION_SPRAY | RESPIRATORY_TRACT | Status: DC | PRN

## 2013-04-20 NOTE — Patient Instructions (Addendum)
Scripts for med refills  Please call as needed

## 2013-04-20 NOTE — Progress Notes (Signed)
Subjective:   07/22/11- 37 yoF here on kind referral by Dr. Ernst [place] because of asthma and history of pneumonia with cough. Husband here. She was a patient of mine in the [place] [facility] at the old practice. She gives a history of asthma and bronchitis. Upper respiratory problems have been more evident since November of 2011. Coughs easily while eating. Coughs each morning bringing up small pellets of green. Wakes nauseated in the morning until she coughs her chest clear. Nebulizer treatments at her doctors for this has seemed very helpful and she would like to have a machine at home. Wears a mask outside and finds she is much bothered by strong odors heat and humidity. She is using Qvar 40 twice daily but blames that for burning in her mouth despite aggressive anti-thrush management. Using Beconase AQ nasal spray, a rescue inhaler once or twice daily and Tessalon pearls.  She feels distinctly better indoors where her husband has installed an extensive air filter system as well as individual room air cleaners in several rooms. She has encasings on bending and they are careful about dust control. Allergy skin testing in the past was positive without allergy vaccine. She has a history of polycythemia vera followed by Dr. Marin Olp with phlebotomy. Polycythemia is planned for cerebrovascular accident which has left her with cognitive and speech difficulties. She also has history of irregular heartbeat on Toprol. Pneumonia in 1978. No history of deep vein thrombosis or pulmonary embolism. Pneumococcal vaccine twice. Flu vaccine last year with blamed recurrent episodes of bronchitis during the winter. Thoracic steal syndrome was addressed with vascular reconstruction on the right in the 1980s. Chest x-ray report from 06/12/2011 described minimal right basilar atelectasis with no acute infiltrate identified, improved from earlier film. Never smoked. Family history a sister with severe allergy problems, and  with COPD, father worked with asbestos exposure and was a heavy smoker   08/23/11- 52 yoF never smoker, asthma and history of pneumonia with cough, complicated by past history surgery for thoracic outlet syndrome, CVA, allergic rhinitis, polycythemia. After last visit August 20, I have some concern that what she was describing was related to upper airway control after her stroke. She comes today for followup of lab work. ONOX-oxygen desaturation equal or less than 89% on room air for 15.4 minutes, less than or equal to 88% for 9.9 minutes, and lowest recorded saturation 82% on 07/29/2011. This could justify home oxygen for sleep. PFT-08/01/2011-normal baseline spirometry with some small airway response to bronchodilator, normal lung volumes, diffusion moderately reduced. FEV1/FVC 0.85, TLC 96%, DLCO 62%. Alpha I antitrypsin assessment: Normal MM. Speech therapy modified barium swallow was performed 08/01/2011 but I do not yet have the therapist report. I have counseled her care with swallowing, careful chewing and swallowing, sitting upright etc.  12/27/11-  20 yoF never smoker, asthma and history of pneumonia with cough, complicated by past history surgery for thoracic outlet syndrome, CVA, allergic rhinitis, polycythemia. Husband here  PCP Dr Jacelyn Grip After overnight oximetry she qualified for home oxygen during sleep at 2 L per minute/[facility]. They have an electrical generator and her husband is getting a backup generator. She chokes occasionally eating without obvious reflux. We discussed dysphagia and aspiration risk. Speech therapist report describes esophageal dysphagia without aspiration. A copy of this report will be forwarded to Dr. Jacelyn Grip who might consider speech therapy for swallowing training. Dr Marin Olp gave prednisone for polycythemia.  04/20/12- 39 yoF never smoker, asthma and history of pneumonia with cough, complicated by  past history surgery for thoracic outlet syndrome, CVA, allergic  rhinitis, polycythemia. Husband here    PCP Dr Modesto Charon  Husband is here  PT STATES INCREASE SOB,WHEEZING,DUE TO SEASON. HAS A DRY HACKY COUGH  She is blaming bad weather for her increased cough. They put a big air cleaner system in the home. She has been staying indoors to avoid pollen. Benzonatate helps her chronic cough. Nebulizer helps her wheezing.  Wears oxygen for sleep. Dr Myna Hidalgo continues to treat her for polycythemia. We again discussed her history of CVA and questionable diagnosis of MS. Swallowing evaluation (August 2012/filed under imaging) describes esophageal dysphagia and recommended reflux precautions but did not see aspiration, reflux or penetration. She coughed throughout the procedure. ++ Consider allergy profile next visit++  10/20/12- 60 yoF never smoker, asthma and history of pneumonia with cough, complicated by past history surgery for thoracic outlet syndrome, CVA, allergic rhinitis, polycythemia. Husband here    PCP Dr Modesto Charon  Husband is here FOLLOWS FOR: doing well as long as sticking with "plan of care".  Had flu vaccine. Blames either Spiriva or Qvar for burning feeling on her tongue and problems with her teeth. Stopped both and doing better.  She is sensitive to weather change. Has morning cough with clear or tan of mucus. Evaluated for degenerative disc disease and syncopal episodes.  04/20/13- 62 yoF never smoker, asthma and history of pneumonia with cough, complicated by past history surgery for thoracic outlet syndrome, CVA, allergic rhinitis, polycythemia. Husband here    PCP Dr Modesto Charon  Husband is here Allergy profile was NEG, 4.9 total IgE Bronchitis with productive cough after an afternoon outdoors. She stayed indoors the next day and is better now. Continues oxygen 2-3 L/[facility]. Medications reviewed.these  Review of Systems- See HPI Constitutional:   No-   weight loss, night sweats, fevers, chills, fatigue, lassitude. HEENT:   frequent  headaches,  Some difficulty  swallowing,, sore throat,       No-  sneezing, itching, ear ache, +nasal congestion, post nasal drip,  CV:  No-   chest pain, orthopnea, PND, swelling in lower extremities, anasarca, dizziness, palpitations Resp: + shortness of breath with exertion or at rest.                No-  coughing up of blood.              +   change in color of mucus.  No- wheezing.   Skin: No-   rash or lesions. GI:  No-   heartburn, indigestion, abdominal pain, nausea, vomiting, GU:  MS:  No-   joint pain or swelling.   Neuro- nothing new or unusual  Psych:  No- change in mood or affect. No depression or anxiety.  No memory loss.    Objective:   Physical Exam  General- Alert, Oriented, Affect-appropriate, Distress- none acute,  Overweight. O2 2L/ [facility] Skin- rash-none, lesions- none, excoriation- none Lymphadenopathy- none Head- atraumatic            Eyes- Gross vision intact, PERRLA, conjunctivae clear secretions            Ears- Hearing, canals            Nose- Clear, No- Septal dev, mucus, polyps, erosion, perforation             Throat- Mallampati III , mucosa clear , drainage- none, tonsils- atrophic    Speech is dysphonic, hoarse, hesitant Neck- flexible , trachea midline, no stridor , thyroid nl,  carotid no bruit Chest - symmetrical excursion , unlabored           Heart/CV- RRR , no murmur , no gallop  , no rub, nl s1 s2                           - JVD- none , edema- none, stasis changes- none, varices- none           Lung- clear to P&A, wheeze- none, +cough dry cough with deep breath , dullness-none, rub- none, unlabored           Chest wall- Vascular surgery scar Right Upper Anterior chest Abd- Br/ Gen/ Rectal- Not done, not indicated Extrem- cyanosis- none, clubbing, none, atrophy- none, strength- . +Rolling walker Neuro- grossly intact to observation

## 2013-05-01 NOTE — Assessment & Plan Note (Signed)
Asthma with bronchitis. Recent exacerbation related to outdoor pollen exposure, now back under control. Plan-medications reviewed. Oxygen discussed. Tessalon.

## 2013-05-10 ENCOUNTER — Ambulatory Visit: Payer: Medicare Other | Admitting: Hematology & Oncology

## 2013-05-10 ENCOUNTER — Telehealth: Payer: Self-pay | Admitting: Hematology & Oncology

## 2013-05-10 ENCOUNTER — Other Ambulatory Visit: Payer: Medicare Other | Admitting: Lab

## 2013-05-10 NOTE — Telephone Encounter (Signed)
Pt is running a fever moved 6-9 to 6-12

## 2013-05-13 ENCOUNTER — Ambulatory Visit (HOSPITAL_BASED_OUTPATIENT_CLINIC_OR_DEPARTMENT_OTHER): Payer: Medicare Other

## 2013-05-13 ENCOUNTER — Other Ambulatory Visit (HOSPITAL_BASED_OUTPATIENT_CLINIC_OR_DEPARTMENT_OTHER): Payer: Medicare Other | Admitting: Lab

## 2013-05-13 ENCOUNTER — Ambulatory Visit (HOSPITAL_BASED_OUTPATIENT_CLINIC_OR_DEPARTMENT_OTHER): Payer: Medicare Other | Admitting: Hematology & Oncology

## 2013-05-13 VITALS — BP 116/50 | HR 85 | Temp 98.5°F | Resp 18 | Ht 67.0 in | Wt 196.0 lb

## 2013-05-13 VITALS — BP 94/63 | HR 88 | Resp 20

## 2013-05-13 DIAGNOSIS — D45 Polycythemia vera: Secondary | ICD-10-CM

## 2013-05-13 LAB — CBC WITH DIFFERENTIAL (CANCER CENTER ONLY)
BASO#: 0 10*3/uL (ref 0.0–0.2)
BASO%: 0.4 % (ref 0.0–2.0)
Eosinophils Absolute: 0.2 10*3/uL (ref 0.0–0.5)
HCT: 37.7 % (ref 34.8–46.6)
HGB: 11.9 g/dL (ref 11.6–15.9)
LYMPH#: 1.4 10*3/uL (ref 0.9–3.3)
MONO#: 0.6 10*3/uL (ref 0.1–0.9)
NEUT%: 60.6 % (ref 39.6–80.0)
RBC: 5.19 10*6/uL (ref 3.70–5.32)
RDW: 16.8 % — ABNORMAL HIGH (ref 11.1–15.7)
WBC: 5.3 10*3/uL (ref 3.9–10.0)

## 2013-05-13 NOTE — Patient Instructions (Addendum)

## 2013-05-13 NOTE — Progress Notes (Signed)
This office note has been dictated.

## 2013-05-13 NOTE — Progress Notes (Signed)
Natalie Hendrix presents today for phlebotomy per MD orders. Phlebotomy procedure started at 1430 and ended at 1455. 500 mls  removed. Patient observed for 30 minutes after procedure without any incident. Patient tolerated procedure well. IV needle removed intact.

## 2013-05-14 NOTE — Progress Notes (Signed)
CC:   Natalie Hendrix, M.D. Natalie Hendrix. Natalie Hendrix, M.D. Natalie D. Maple Hudson, MD, FCCP, Natalie Hendrix. Natalie Hendrix, M.D.  DIAGNOSES:  Polycythemia vera, JAK2 negative.  CURRENT THERAPY: 1. Phlebotomy to maintain hematocrit less than 38%. 2. Plavix 75 mg p.o. daily.  INTERIM HISTORY:  Natalie Hendrix comes in for her followup.  She feels a little bit worse this time.  She has a little bit more dizziness.  She typically has more symptoms when her hematocrit gets close to 38 or above.  We have not had to phlebotomize her for quite a while.  However, I think that we may need to phlebotomize her today.  She has no other symptoms.  There is no nausea or vomiting.  She still uses oxygen on occasion.  She has underlying lung disease.  There has been no leg swelling.  She has had some vertigo which is chronic.  PHYSICAL EXAMINATION:  General:  This is a well-developed, well- nourished white female in no obvious distress.  Vital signs:  Show temperature of 98.5, pulse 85, respiratory rate 18, blood pressure 116/50.  Weight is 196.  Head and neck:  Shows a normocephalic, atraumatic skull.  There are no ocular or oral lesions.  There are no palpable cervical or supraclavicular lymph nodes.  Lungs:  Clear to percussion and auscultation bilaterally.  Cardiac:  Regular rate and rhythm with a normal S1 and S2.  There are no murmurs, rubs or bruits. Abdomen:  Soft with good bowel sounds.  There is no palpable abdominal mass.  There is no fluid wave.  There is no palpable hepatosplenomegaly. Back:  No tenderness over the spine, ribs or hips.  Extremities:  Show no clubbing, cyanosis or edema.  Neurological:  Shows no focal neurological deficits.  LABORATORY STUDIES:  White cell count is 5.3, hemoglobin 12, hematocrit 38, platelet count is 304.  MCV is 73.  IMPRESSION:  Natalie Hendrix is a very nice 62 year old female with polycythemia.  We will go ahead and phlebotomize her today.  Of note, her ferritin has been  quite low.  We have been maintaining her ferritin at a very low range.  Last time we checked it, it was 4 back in April.  We will go ahead and get her back in another couple months.    ______________________________ Natalie Hendrix, M.D. PRE/MEDQ  D:  05/13/2013  T:  05/14/2013  Job:  [id number]

## 2013-05-31 ENCOUNTER — Ambulatory Visit (INDEPENDENT_AMBULATORY_CARE_PROVIDER_SITE_OTHER): Payer: Medicare Other | Admitting: Neurology

## 2013-05-31 ENCOUNTER — Encounter: Payer: Self-pay | Admitting: Neurology

## 2013-05-31 VITALS — BP 89/61 | HR 83 | Temp 97.2°F | Ht 66.5 in | Wt 199.0 lb

## 2013-05-31 DIAGNOSIS — Z9189 Other specified personal risk factors, not elsewhere classified: Secondary | ICD-10-CM

## 2013-05-31 DIAGNOSIS — R42 Dizziness and giddiness: Secondary | ICD-10-CM

## 2013-05-31 DIAGNOSIS — Z87898 Personal history of other specified conditions: Secondary | ICD-10-CM

## 2013-05-31 DIAGNOSIS — R269 Unspecified abnormalities of gait and mobility: Secondary | ICD-10-CM

## 2013-05-31 DIAGNOSIS — D45 Polycythemia vera: Secondary | ICD-10-CM

## 2013-05-31 NOTE — Patient Instructions (Addendum)
I think overall you are doing fairly well but I do want to suggest a few things today:  Remember to drink plenty of fluid, eat healthy meals and do not skip any meals. Try to eat protein with a every meal and eat a healthy snack such as fruit or nuts in between meals. Try to keep a regular sleep-wake schedule and try to exercise daily, particularly in the form of walking, 20-30 minutes a day, if you can.   Engage in social activities in your community and with your family and try to keep up with current events by reading the newspaper or watching the news.   As far as your medications are concerned, I would like to suggest no changes   As far as diagnostic testing: no new test needed.   I suggest a 6 month FU with one of my associates. Our nurse, Alverda Skeans will call you for your appointment.

## 2013-05-31 NOTE — Progress Notes (Signed)
Subjective:    Patient ID: Natalie Hendrix is a 62 y.o. female.  HPI   Interim history:  Natalie Hendrix is a very pleasant 62 year old right-handed woman who presents for followup consultation of her Hx of syncope and vertigo as well as migraine headaches, gait dysfunction, chronic back pain, and memory loss. She is accompanied by her husband today. This is her first visit after Dr. Imagene Gurney retirement. She last saw Dr. Fayrene Fearing love on 01/19/2013, and which time he was not entirely sure why she was having recurrent and ongoing syncopal events. He advised her to wear compression stockings at all times. She has an underlying medical history of hyperlipidemia, migraines, vertigo, foreign accent syndrome, anxiety, depression, fibromyalgia, polycythemia vera, asthma, history of pneumonia, prediabetes. She sees Dr. Loraine Leriche Philips for pain management. She has L5/S1 radiculopathy. She has a Hx thoracic outlet syndrome on the R and had surgery in the 80s. She had some Sx on the L as well and cannot reach over her head on the L.  She is currently on Proventil, Astelin, Toprol-XL, Plavix, Wellbutrin XL, present him, Pravachol, meclizine, Allegra, Protonix, Tessalon, Proventil, Lidoderm patch, morphine, Centrum Silver, omega-3, calcium,magnesium, riboflavin, flaxseed oil, coenzyme Q10, Flexeril.  I reviewed Dr. Imagene Gurney prior notes and the patient's records and below is a summary of that review:  62 year old right-handed woman with a long history of vertigo, intractable migraine headaches, and tremor, memory loss, deep dysfunction, foreign accent syndrome, chronic back pain and polycythemia vera as well as depression in particular after losing her son. She has had intermittent dizziness resulting in falls and blackout spells. She is usually unconscious for less than a minute. No seizure activity is reported and no urinary or bowel incontinence during these episodes. The episodes are erratic and she does have a history of  orthostatic hypotension and is on a beta blocker for tachycardia. She reports no tinnitus. She has episodes of vertigo with nystagmus reported. She receives phlebotomy once every 2 months for her PV. Head CT from October 2013 and MRI from January 2012 showed nonspecific white matter changes. She has lung disease and is followed by Dr. Maple Hudson. She uses oxygen 24-7. She has a history of recurrent pneumonias. She had choking with swallowing and is on a soft diet. In October 2012 her MMSE was 20, clock drawing was 4, animal fluency was 12. She has lower back pain and had epidural injections. She has fallen and injured her back. In October 2013 her MMSE was 21, clock drawing was 3, animal fluency was 8. In February 2014 her MMSE was 21, clock drawing was 4, animal fluency was 12. She had a brain MRI on 10/16/2012: Mildly abnormal MRI brain (without contrast) demonstrating: 1. Several small, non-specific foci of gliosis in the bifrontal and biparietal subcortical white matter. 2. No abnormal enhancing lesions. Abnormal MRI cervical spine (with and without contrast) demonstrating: 1. Disc bulging from C4-5 to C6-7. No spinal stenosis or foraminal narrowing. 2. No intrinsic or enhancing spinal cord lesions.  She needs phlebotomy if her HCT goes above 38. She tripped in the yard and fell into the tomatoes. She has been wearing toesocks. She has had no CP, no palpitations. She had a swallow study a couple of years ago.   Her Past Medical History Is Significant For: Past Medical History  Diagnosis Date  . GERD (gastroesophageal reflux disease)   . Hyperlipidemia   . Depression   . Allergy   . Asthma   . Dizziness   .  Vertigo   . Migraine headache   . Fluttering heart   . CVA (cerebral vascular accident)   . Hypertension   . Thoracic outlet syndrome   . Allergic rhinitis   . Fibromyalgia   . Polycythemia rubra vera     Her Past Surgical History Is Significant For: Past Surgical History  Procedure  Laterality Date  . Appendectomy    . Resection rib partial    . Gallbladder surgery    . Breast surgery  1979  . Synonectomy    . Dilation and curettage of uterus    . Root resection and revascularization  1980    of Long thoracic artery   Her Family History Is Significant For: Family History  Problem Relation Age of Onset  . Cervical cancer Mother   . Heart disease Mother   . Hypertension Mother   . Stroke Other     aunt  . Hypertension Other     aunt  . Stroke Other     uncle  . Heart attack Other     uncle  . Emphysema Other     aunt  . Emphysema Father   . Allergies      aunt and sister  . Cancer Mother   . Clotting disorder Mother     and aunts x 2  . Rheum arthritis      grandmother    Her Social History Is Significant For: History   Social History  . Marital Status: Married    Spouse Name: N/A    Number of Children: N  . Years of Education: N/A   Occupational History  . RETIRED     occupational hand therapist   Social History Main Topics  . Smoking status: Never Smoker   . Smokeless tobacco: None  . Alcohol Use: None  . Drug Use: None  . Sexually Active: None   Other Topics Concern  . None   Social History Narrative  . None    Her Allergies Are:  Allergies  Allergen Reactions  . Qvar (Beclomethasone)   . Spiriva (Tiotropium Bromide Monohydrate)     rash  . Chlorzoxazone   . Epinephrine     Raises heart rate  . Lamotrigine   . Levofloxacin   . Neurontin (Gabapentin)     arthralgia  . Protriptyline Hcl     Severe constipation  . Ropinirole Hcl     arthralgia  . Tizanidine     panic  . Verapamil   . Zonegran     Mood swings  . Advair Diskus (Fluticasone-Salmeterol) Rash  . Baclofen Rash  . Chlorzoxazone Rash  . Levofloxacin Rash  :   Her Current Medications Are:  Outpatient Encounter Prescriptions as of 05/31/2013  Medication Sig Dispense Refill  . albuterol (PROVENTIL HFA;VENTOLIN HFA) 108 (90 BASE) MCG/ACT inhaler  Inhale 2 puffs into the lungs every 4 (four) hours as needed for shortness of breath.  3 Inhaler  3  . albuterol (PROVENTIL) (2.5 MG/3ML) 0.083% nebulizer solution Take 3 mLs (2.5 mg total) by nebulization every 6 (six) hours as needed for wheezing.  360 mL  3  . azelastine (ASTELIN) 137 MCG/SPRAY nasal spray Place 1 spray into the nose 2 (two) times daily. Use in each nostril as directed  90 mL  3  . beclomethasone (BECONASE AQ) 42 MCG/SPRAY nasal spray Place 2 sprays into the nose 2 (two) times daily. Dose is for each nostril.  75 g  3  . benzonatate (TESSALON)  100 MG capsule Take 1 capsule (100 mg total) by mouth every 6 (six) hours as needed for cough.  30 capsule  3  . buPROPion (WELLBUTRIN XL) 300 MG 24 hr tablet Take 300 mg by mouth daily.        . Calcium Carbonate-Vit D-Min (CALCIUM 1200 PO) Take 2,400 mg by mouth daily.       . clopidogrel (PLAVIX) 75 MG tablet Take 1 tablet (75 mg total) by mouth daily.  90 tablet  3  . co-enzyme Q-10 30 MG capsule Take 30 mg by mouth daily.       . cyclobenzaprine (FLEXERIL) 10 MG tablet Take 10 mg by mouth 3 (three) times daily as needed.        Marland Kitchen ESTRACE VAGINAL 0.1 MG/GM vaginal cream once a week.       . ezetimibe (ZETIA) 10 MG tablet Take 10 mg by mouth daily.        . fexofenadine (ALLEGRA) 180 MG tablet Take 1 tablet (180 mg total) by mouth daily. As needed  100 tablet  3  . Flaxseed, Linseed, (FLAXSEED OIL) 1200 MG CAPS Take by mouth 1 dose over 24 hours.        . Grape Seed Extract 30 MG CAPS Take by mouth every morning.      . lidocaine (LIDODERM) 5 % Place 1 patch onto the skin as needed. Remove & Discard patch within 12 hours or as directed by MD       . LORazepam (ATIVAN) 0.5 MG tablet Take 2 tabs in morning and 2 tablets at bedtime      . magnesium gluconate (MAGONATE) 500 MG tablet Take 500 mg by mouth daily.       . meclizine (ANTIVERT) 25 MG tablet Take 25 mg by mouth 3 (three) times daily as needed.        . metoprolol (TOPROL-XL) 50  MG 24 hr tablet Take 50 mg by mouth daily.        . Misc Natural Products (TART CHERRY ADVANCED PO) Take by mouth. Juice Concentrate- 1tsp daily      . morphine (MS CONTIN) 30 MG 12 hr tablet Take 15 mg by mouth 2 (two) times daily.       Marland Kitchen morphine (MSIR) 15 MG tablet As needed for migraines       . NON FORMULARY Take 100 mg/day by mouth every morning. [facility] Extract// Pygnogenol      . Omega-3 Fatty Acids (FISH OIL) 1200 MG CAPS Take 2,400 mg by mouth 2 (two) times daily.      . pantoprazole (PROTONIX) 40 MG tablet Take 40 mg by mouth daily.        . pravastatin (PRAVACHOL) 40 MG tablet Take 40 mg by mouth daily.       . promethazine (PHENERGAN) 25 MG tablet every 8 (eight) hours as needed.       Marland Kitchen Respiratory Therapy Supplies (NEBULIZER) DEVI Use as directed  1 each  0  . riboflavin (VITAMIN B-2) 100 MG TABS Take 200 mg by mouth 2 (two) times daily before a meal.      . Turmeric Curcumin 500 MG CAPS Take 1 capsule by mouth daily.      Marland Kitchen ZOVIRAX 5 % Apply 1 application topically as needed.        No facility-administered encounter medications on file as of 05/31/2013.   Review of Systems  Constitutional: Positive for fever and chills.  HENT: Positive for  trouble swallowing.   Eyes: Positive for pain and visual disturbance (double vision).  Respiratory: Positive for cough, shortness of breath and wheezing.        Snoring  Genitourinary: Positive for urgency.  Musculoskeletal: Positive for myalgias, joint swelling and arthralgias.  Allergic/Immunologic: Positive for environmental allergies.  Neurological: Positive for dizziness, tremors, syncope, speech difficulty, weakness, light-headedness, numbness and headaches.  Hematological: Bruises/bleeds easily.       Anemia  Psychiatric/Behavioral: The patient is nervous/anxious.     Objective:  Neurologic Exam  Physical Exam Physical Examination:   Filed Vitals:   05/31/13 1059  BP: 89/61  Pulse: 83  Temp: 97.2 F (36.2 C)     General Examination: The patient is a very pleasant 62 y.o. female in no acute distress. She appears well-developed and well-nourished and adequately groomed.   HEENT: Normocephalic, atraumatic, pupils are equal, round and reactive to light and accommodation. She is using O2, 2 lpm via [place]. Extraocular tracking is good without limitation to gaze excursion or nystagmus noted. Normal smooth pursuit is noted. Hearing is grossly intact. Tympanic membranes are clear bilaterally. Face is symmetric with normal facial animation and normal facial sensation. Speech is mildly dysarthric and hesitant. There is no hypophonia. There is no lip, neck/head, jaw or voice tremor. Neck is supple with full range of passive and active motion. There are no carotid bruits on auscultation. Oropharynx exam reveals: mild mouth dryness, adequate dental hygiene and mild airway crowding, due to narrow airway. Mallampati is class I. Tongue protrudes centrally and palate elevates symmetrically.   Chest: Clear to auscultation without wheezing, rhonchi or crackles noted.  Heart: S1+S2+0, regular and normal without murmurs, rubs or gallops noted.   Abdomen: Soft, non-tender and non-distended with normal bowel sounds appreciated on auscultation.  Extremities: There is no pitting edema in the distal lower extremities bilaterally. Pedal pulses are intact.  Skin: Warm and dry without trophic changes noted. There are no varicose veins.  Musculoskeletal: exam reveals no obvious joint deformities, tenderness or joint swelling or erythema.   Neurologically:  Mental status: The patient is awake, alert and oriented to self, place, circumstance. Her memory, attention, language and knowledge are mildly impaired. There is no aphasia, agnosia, apraxia or anomia. Speech is clear with normal prosody and enunciation. Thought process is linear. Mood is congruent and affect is blunted.  Cranial nerves are as described above under HEENT exam. In  addition, shoulder shrug is normal with equal shoulder height noted. Motor exam: Normal bulk, strength and tone is noted. There is no drift, tremor or rebound. Romberg is mildly positive. Reflexes are 1+ in the UEs and trace in her knees, absent in her ankles. Fine motor skills are fairly well preserved with some deliberate movements.  Cerebellar testing shows no dysmetria or intention tremor on finger to nose testing. There is no truncal or gait ataxia.  Sensory exam is intact to light touch, pinprick, vibration, temperature sense in the upper extremities but slightly decreased in her legs.  Gait, station and balance: She stands up with mild difficulty and needs to push herself up. No veering to one side is noted. No leaning to one side is noted. Posture is age-appropriate and stance is mildly wide-based. She walks with her rolling walker. No problems turning are noted.   Assessment and Plan:   Assessment and Plan:  In summary, NAKEYA ADINOLFI is a very pleasant 62 y.o.-year old female with a history of syncope, vertigo, migraine, gait dysfunction. She is relatively  stable and is advised to follow up with one of my colleagues in about 6 months. Her exam is stable, when I compare my findings with Dr. Imagene Gurney last note.   I had a long chat with the patient and her husband about my findings and her Sx. We talked about medical treatments and non-pharmacological approaches. We talked about maintaining a healthy lifestyle in general. I encouraged the patient to eat healthy, exercise daily and keep well hydrated, to keep a scheduled bedtime and wake time routine, to not skip any meals and eat healthy snacks in between meals and to have protein with every meal.   As far as further diagnostic testing is concerned, I suggested the following today: no new test needed. As far as medications are concerned, I recommended the following at this time: no change.  I answered all their questions today and the patient and  her husband were in agreement with the above outlined plan.

## 2013-07-06 ENCOUNTER — Other Ambulatory Visit: Payer: Self-pay | Admitting: *Deleted

## 2013-07-07 ENCOUNTER — Other Ambulatory Visit: Payer: Self-pay | Admitting: Family Medicine

## 2013-07-07 ENCOUNTER — Other Ambulatory Visit: Payer: Self-pay | Admitting: *Deleted

## 2013-07-07 MED ORDER — EZETIMIBE 10 MG PO TABS: 10.0000 mg | ORAL_TABLET | Freq: Every day | ORAL | Status: DC

## 2013-07-08 NOTE — Telephone Encounter (Signed)
This will print, please put last 4 of Social on rx and fax to the number she gave

## 2013-07-09 MED ORDER — EZETIMIBE 10 MG PO TABS: 10.0000 mg | ORAL_TABLET | Freq: Every day | ORAL | Status: DC

## 2013-07-09 NOTE — Telephone Encounter (Signed)
rx faxed to number listed  Pt aware

## 2013-07-14 ENCOUNTER — Ambulatory Visit (HOSPITAL_BASED_OUTPATIENT_CLINIC_OR_DEPARTMENT_OTHER): Payer: Medicare Other | Admitting: Hematology & Oncology

## 2013-07-14 ENCOUNTER — Other Ambulatory Visit (HOSPITAL_BASED_OUTPATIENT_CLINIC_OR_DEPARTMENT_OTHER): Payer: Medicare Other | Admitting: Lab

## 2013-07-14 VITALS — BP 123/59 | HR 78 | Temp 98.5°F | Resp 20 | Wt 201.0 lb

## 2013-07-14 DIAGNOSIS — D45 Polycythemia vera: Secondary | ICD-10-CM

## 2013-07-14 DIAGNOSIS — E568 Deficiency of other vitamins: Secondary | ICD-10-CM

## 2013-07-14 DIAGNOSIS — I69993 Ataxia following unspecified cerebrovascular disease: Secondary | ICD-10-CM

## 2013-07-14 LAB — CBC WITH DIFFERENTIAL (CANCER CENTER ONLY)
BASO#: 0 10*3/uL (ref 0.0–0.2)
Eosinophils Absolute: 0.1 10*3/uL (ref 0.0–0.5)
HCT: 34 % — ABNORMAL LOW (ref 34.8–46.6)
LYMPH%: 22.2 % (ref 14.0–48.0)
MCV: 71 fL — ABNORMAL LOW (ref 81–101)
MONO#: 0.5 10*3/uL (ref 0.1–0.9)
NEUT%: 66.2 % (ref 39.6–80.0)
RBC: 4.8 10*6/uL (ref 3.70–5.32)
RDW: 16.5 % — ABNORMAL HIGH (ref 11.1–15.7)
WBC: 5.3 10*3/uL (ref 3.9–10.0)

## 2013-07-14 NOTE — Progress Notes (Signed)
This office note has been dictated.

## 2013-07-15 ENCOUNTER — Telehealth: Payer: Self-pay | Admitting: Hematology & Oncology

## 2013-07-15 NOTE — Telephone Encounter (Signed)
Talked with Vladimir Crofts at [facility]. They will contact pt to schedule appointment after MD review, pt aware and has their phone number.

## 2013-07-15 NOTE — Progress Notes (Signed)
DIAGNOSIS:  Polycythemia vera JAK2 negative.  CURRENT THERAPY: 1. Phlebotomy to maintain hematocrit less than 38%. 2. Plavix 75 mg p.o. daily.  INTERIM HISTORY:  Natalie Hendrix comes in for followup.  She seems to be doing okay.  Her husband continues to have new problems.  He is having some more pulmonary issues.  Hopefully, he will improve from this.  She, unfortunately, does not appear to get along with her new neurologist.  She wants to change.  We will see about referring her to [facility].  She has had no further cerebrovascular issues.  She continues on the Plavix.  There have been no seizures.  She has had no cough.  There has been no increased shortness of breath. She  is on oxygen herself.  She does have underlying pulmonary disease.  PHYSICAL EXAMINATION:  General:  This is a fairly well-developed, well- nourished white female in no obvious distress.  Vital signs: Temperature of 98.5, pulse 78, respiratory rate 20, blood pressure 123/59, weight is 201 pounds.  Head and neck:  Normocephalic, atraumatic skull.  There are no ocular or oral lesions.  There are no palpable cervical or supraclavicular lymph nodes.  Lungs:  Clear bilaterally. Cardiac:  Regular rate and rhythm with a normal S1 and S2.  There are no murmurs, rubs or bruits.  Abdomen:  Soft.  She has good bowel sounds. There is no fluid wave.  There is no palpable hepatosplenomegaly. Extremities:  Show no clubbing, cyanosis or edema.  She has good strength bilaterally.  Skin:  No rashes, ecchymoses or petechiae. Neurological:  Shows no focal neurological deficits.  LABORATORY STUDIES:  White blood cell count is 5.3, hemoglobin 10.2, hematocrit 34, platelet count 287.  MCV is 71.  IMPRESSION:  Natalie Hendrix is a 62 year old white female with history of polycythemia vera.  She is doing well with this.  Since we have been maintaining a very aggressive phlebotomy posture, she has not had any problems  cerebrovascularly.  We will go ahead and plan to get her back in 2 months' time.  She is clearly iron deficient.  Her last ferritin back in June was 5.   ______________________________ Josph Macho, M.D. PRE/MEDQ  D:  07/14/2013  T:  07/15/2013  Job:  [id number]

## 2013-07-23 ENCOUNTER — Other Ambulatory Visit: Payer: Self-pay

## 2013-07-23 ENCOUNTER — Telehealth: Payer: Self-pay | Admitting: Family Medicine

## 2013-07-26 NOTE — Progress Notes (Signed)
This office note has been dictated.

## 2013-07-28 NOTE — Telephone Encounter (Signed)
Pt aware rx called to [facility]

## 2013-07-28 NOTE — Telephone Encounter (Signed)
She was a patient from [facility] and we may have written a paper Rx in the past. It is okay to call in for [employer] Magic mouth wash 8 ounces and 1 tsp swish and gargle and spit four times a  Day . Refill x 1 . Thanks. Fayette Gasner P. [place] Charon, M.D.

## 2013-07-28 NOTE — Telephone Encounter (Signed)
I do not see where we have done this before? Pls. advise

## 2013-08-04 ENCOUNTER — Ambulatory Visit (INDEPENDENT_AMBULATORY_CARE_PROVIDER_SITE_OTHER): Payer: Medicare Other | Admitting: Neurology

## 2013-08-04 ENCOUNTER — Encounter: Payer: Self-pay | Admitting: Neurology

## 2013-08-04 VITALS — BP 110/60 | HR 82 | Temp 98.6°F | Ht 66.0 in | Wt 203.0 lb

## 2013-08-04 DIAGNOSIS — F329 Major depressive disorder, single episode, unspecified: Secondary | ICD-10-CM

## 2013-08-04 DIAGNOSIS — F0150 Vascular dementia without behavioral disturbance: Secondary | ICD-10-CM

## 2013-08-04 DIAGNOSIS — IMO0001 Reserved for inherently not codable concepts without codable children: Secondary | ICD-10-CM

## 2013-08-04 DIAGNOSIS — F0151 Vascular dementia with behavioral disturbance: Secondary | ICD-10-CM

## 2013-08-04 DIAGNOSIS — R42 Dizziness and giddiness: Secondary | ICD-10-CM

## 2013-08-04 DIAGNOSIS — G43009 Migraine without aura, not intractable, without status migrainosus: Secondary | ICD-10-CM

## 2013-08-04 DIAGNOSIS — Z8679 Personal history of other diseases of the circulatory system: Secondary | ICD-10-CM

## 2013-08-04 NOTE — Progress Notes (Signed)
NEUROLOGY CONSULTATION NOTE  WINNONA Hendrix MRN: [id number] DOB: 04/07/1951  Referring provider: Dr. Myna Hidalgo Primary care provider: Dr. Modesto Charon  Reason for consult:  Establish care.  HISTORY OF PRESENT ILLNESS: Natalie Hendrix is a 62 year old woman with history of vertigo, syncope, migraines, essential tremor, stroke, memory loss, back pain, gait dysfunction, polycythemia vera, and foreign accent syndrome who presents to establish care.  She was previously followed by [facility].  She is accompanied by her husband.  Records and images were personally reviewed where available.    Symptoms really started back in 1999.  She had a routine gynecological procedure.  During the procedure, she had a spike and drop in blood pressure.  Afterwards, she exhibited dizziness, falls, facial droop and speech difficulties.  Work up, including MRI, was unremarkable.   She has several issues:  She has had multiple syncopal episodes, where she has dizziness followed by fall and loss of consciousness.  She is usually unconscious for less than a minute.  She does not exhibit and convulsions, tongue biting or urinary or bowel incontinence.  She does carry a diagnosis of orthostatic hypotension, and takes a beta blocker for tachycardia.  Review of vitals over the past several years range from 89-144 systolic and 50-104 diastolic with pulse 66-97 bpm.  She has episodes of spinning as well, and nystagmus has been observed.  There is no reported tinnitus.  She tends to fall to the left.  She was recommended to wear compression stockings.  She uses meclizine.  She has seen Dr. Lazarus Salines for vertigo.   She also has problems with memory.  In October 2012, her MMSE was 20, clock drawing was 4, and animal fluency was 12.  In February 2014, her MMSE was 21, clock drawing was 4, and animal fluency was 12.  She does have anxiety as well as depression, related to the passing of her son.  She needs assistance from  her husband to perform ADLs.  She cannot cook for herself or administer her medication.  Sometimes, her husband has to open the door for her to the bathroom.  She apparently had a formal neuropsychological exam, but I don't have the results of that evaluation.   She has a history of fibromyalgia as well as L5/S1 radiculopathy, causing back and leg pain, restless leg and contributing to falls.  She has migraines, with aura of flashing lights, left sided headache, nausea and vomiting.  She had status migrainosis after the stroke in 1999, in which she received DHE.  Migraine attacks are currently controlled with biofeedback.  She is followed by Dr. Janalyn Shy for pain management.  She has had epidural injections in the past.  She takes flexeril, Lidoderm patch, morphine.   It was discovered that she had polycythemia vera, which could have been contributing to TIAs.  Her polycythemia vera, she is followed by Dr. Myna Hidalgo.  She requires phlebotomy if her HCT is greater than 38.  Since treatment, she really hasn't noticed any stability of symptoms.   She also has pulmonary disease, for which she sees Dr. Maple Hudson.  Her O2 saturations have been as low as 79%.  She uses 2% nasal O2.  She has history of recurrent pneumonia, possibly secondary to aspiration.    She has a history of choking sensation with solid foods, in which she now eats a soft diet.  At one point, her previous treating physician, Dr. Avie Echevaria, entertained the idea that it may be related to tardive  dyskinesia.  She had a modified barium swallow on 08/01/11.  It revealed normal oropharyngeal swallow function with an esophageal dysphagia, with reduced peristalsis and slow esophageal emptying, especially with puree and cracker boluses.  There was no aspiration or penetration observed.  She had frequent dry cough.  She also has speech difficulty described as word-finding difficulties and effortful poorly articulated speech.   Of note, she also has  remote history of thoracic outlet syndrome on the right, for which she had surgery in the 80s.  Over the past year, she reportedly has had worsening of symptoms, in regards to dragging her right leg and speech.  Dr. Sandria Manly repeated MRI last fall, which did not reveal any acute change.   MRI Brain w/contrast 10/17/12: 1.  Several small, non-specific foci of gliosis in the bifrontal and biparietal subcortical white matter.  2.  No abnormal enhancing lesions.   MRI cervical spine with and without contrast (10/17/12): 1. Disc bulging from C4-5 to C6-7.  No spinal stenosis or foraminal narrowing.  2.  No intrinsic or enhancing spinal cord lesions.   07/14/13: WBC 5.3, Hgb 10.2, Hct 34, PLT 287, ferritin 3 TSH (03/20/11) 1.523, B12 (06/15/08) 468   Medications include: Plavix, Wellbutrin XL, Flexeril, Lidoderm patch, morphine, Centrum Silver, omega-3, calcium, magnesium, riboflavin, flaxseed oil, coenzyme Q10, Proventil, Astelin, Toprol-XL, Pravachol, Protonix.  PAST MEDICAL HISTORY: Past Medical History  Diagnosis Date  . GERD (gastroesophageal reflux disease)   . Hyperlipidemia   . Depression   . Allergy   . Asthma   . Dizziness   . Vertigo   . Migraine headache   . Fluttering heart   . CVA (cerebral vascular accident)   . Hypertension   . Thoracic outlet syndrome   . Allergic rhinitis   . Fibromyalgia   . Polycythemia rubra vera     PAST SURGICAL HISTORY: Past Surgical History  Procedure Laterality Date  . Appendectomy    . Resection rib partial    . Gallbladder surgery    . Breast surgery  1979  . Synonectomy    . Dilation and curettage of uterus    . Root resection and revascularization  1980    of Long thoracic artery    MEDICATIONS: Current Outpatient Prescriptions on File Prior to Visit  Medication Sig Dispense Refill  . albuterol (PROVENTIL HFA;VENTOLIN HFA) 108 (90 BASE) MCG/ACT inhaler Inhale 2 puffs into the lungs every 4 (four) hours as needed for shortness of  breath.  3 Inhaler  3  . albuterol (PROVENTIL) (2.5 MG/3ML) 0.083% nebulizer solution Take 3 mLs (2.5 mg total) by nebulization every 6 (six) hours as needed for wheezing.  360 mL  3  . azelastine (ASTELIN) 137 MCG/SPRAY nasal spray Place 1 spray into the nose 2 (two) times daily. Use in each nostril as directed  90 mL  3  . beclomethasone (BECONASE AQ) 42 MCG/SPRAY nasal spray Place 2 sprays into the nose 2 (two) times daily. Dose is for each nostril.  75 g  3  . benzonatate (TESSALON) 100 MG capsule Take 1 capsule (100 mg total) by mouth every 6 (six) hours as needed for cough.  30 capsule  3  . buPROPion (WELLBUTRIN XL) 300 MG 24 hr tablet Take 300 mg by mouth daily.        . Calcium Carbonate-Vit D-Min (CALCIUM 1200 PO) Take 2,400 mg by mouth daily.       . clopidogrel (PLAVIX) 75 MG tablet Take 1 tablet (75 mg total)  by mouth daily.  90 tablet  3  . co-enzyme Q-10 30 MG capsule Take 30 mg by mouth daily.       . cyclobenzaprine (FLEXERIL) 10 MG tablet Take 10 mg by mouth 3 (three) times daily as needed.        Marland Kitchen ESTRACE VAGINAL 0.1 MG/GM vaginal cream daily.       Marland Kitchen ezetimibe (ZETIA) 10 MG tablet Take 1 tablet (10 mg total) by mouth daily.  90 tablet  3  . fexofenadine (ALLEGRA) 180 MG tablet Take 1 tablet (180 mg total) by mouth daily. As needed  100 tablet  3  . Flaxseed, Linseed, (FLAXSEED OIL) 1200 MG CAPS Take by mouth 1 dose over 24 hours.        . Grape Seed Extract 30 MG CAPS Take by mouth every morning.      . lidocaine (LIDODERM) 5 % Place 1 patch onto the skin as needed. Remove & Discard patch within 12 hours or as directed by MD       . LORazepam (ATIVAN) 0.5 MG tablet Take 2 tabs in morning and 2 tablets at bedtime      . magnesium gluconate (MAGONATE) 500 MG tablet Take 500 mg by mouth daily.       . meclizine (ANTIVERT) 25 MG tablet Take 25 mg by mouth 3 (three) times daily as needed.        . metoprolol (TOPROL-XL) 50 MG 24 hr tablet Take 50 mg by mouth daily.        . Misc  Natural Products (TART CHERRY ADVANCED PO) Take by mouth. Juice Concentrate- 1tsp daily      . morphine (MS CONTIN) 30 MG 12 hr tablet Take 15 mg by mouth 2 (two) times daily.       Marland Kitchen morphine (MSIR) 15 MG tablet As needed for migraines       . NON FORMULARY Take 100 mg/day by mouth every morning. [facility] Extract// Pygnogenol      . Omega-3 Fatty Acids (FISH OIL) 1200 MG CAPS Take 2,400 mg by mouth 2 (two) times daily.      . pantoprazole (PROTONIX) 40 MG tablet Take 40 mg by mouth daily.        . pravastatin (PRAVACHOL) 40 MG tablet Take 40 mg by mouth daily.       . promethazine (PHENERGAN) 25 MG tablet every 8 (eight) hours as needed.       Marland Kitchen Respiratory Therapy Supplies (NEBULIZER) DEVI Use as directed  1 each  0  . riboflavin (VITAMIN B-2) 100 MG TABS Take 200 mg by mouth 2 (two) times daily before a meal.      . Turmeric Curcumin 500 MG CAPS Take 1 capsule by mouth daily.      Marland Kitchen ZOVIRAX 5 % Apply 1 application topically as needed.        No current facility-administered medications on file prior to visit.    ALLERGIES: Allergies  Allergen Reactions  . Qvar [Beclomethasone]   . Spiriva [Tiotropium Bromide Monohydrate]     rash  . Chlorzoxazone   . Epinephrine     Raises heart rate  . Lamotrigine   . Levofloxacin   . Neurontin [Gabapentin]     arthralgia  . Protriptyline Hcl     Severe constipation  . Ropinirole Hcl     arthralgia  . Tizanidine     panic  . Verapamil   . Zonegran     Mood  swings  . Advair Diskus [Fluticasone-Salmeterol] Rash  . Baclofen Rash  . Chlorzoxazone Rash  . Levofloxacin Rash    FAMILY HISTORY: Family History  Problem Relation Age of Onset  . Cervical cancer Mother   . Heart disease Mother   . Hypertension Mother   . Stroke Other     aunt  . Hypertension Other     aunt  . Stroke Other     uncle  . Heart attack Other     uncle  . Emphysema Other     aunt  . Emphysema Father   . Allergies      aunt and sister  . Cancer  Mother   . Clotting disorder Mother     and aunts x 2  . Rheum arthritis      grandmother    SOCIAL HISTORY: History   Social History  . Marital Status: Married    Spouse Name: N/A    Number of Children: N  . Years of Education: N/A   Occupational History  . RETIRED     occupational hand therapist   Social History Main Topics  . Smoking status: Never Smoker   . Smokeless tobacco: Never Used  . Alcohol Use: No  . Drug Use: No  . Sexual Activity: Not on file   Other Topics Concern  . Not on file   Social History Narrative  . No narrative on file    REVIEW OF SYSTEMS: Constitutional: No fevers, chills, or sweats, no generalized fatigue, change in appetite Eyes: No visual changes, double vision, eye pain Ear, nose and throat: No hearing loss, ear pain, nasal congestion, sore throat Cardiovascular: No chest pain, palpitations Respiratory:  No shortness of breath at rest or with exertion, wheezes GastrointestinaI: No nausea, vomiting, diarrhea, abdominal pain, fecal incontinence Genitourinary:  No dysuria, urinary retention or frequency Musculoskeletal:  No neck pain, back pain Integumentary: No rash, pruritus, skin lesions Neurological: as above Psychiatric: No depression, insomnia, anxiety Endocrine: No palpitations, fatigue, diaphoresis, mood swings, change in appetite, change in weight, increased thirst Hematologic/Lymphatic:  No anemia, purpura, petechiae. Allergic/Immunologic: no itchy/runny eyes, nasal congestion, recent allergic reactions, rashes  PHYSICAL EXAM: Filed Vitals:   08/04/13 1001  BP: 110/60  Pulse: 82  Temp: 98.6 F (37 C)   General: No acute distress Head:  Normocephalic/atraumatic Neck: supple, no paraspinal tenderness, full range of motion Back: No paraspinal tenderness Heart: regular rate and rhythm Lungs: Clear to auscultation bilaterally. Vascular: No carotid bruits. Neurological Exam: Mental status: alert and oriented to person,  place (except building and floor), and time (except date), speech somewhat slow and takes effort to articulate and not dysarthric, able to name, read, write and repeat.  Difficulty spelling WORLD backwards, difficulty following 3-step command across midline, able to copy intersecting pentagons and clock.  MMSE 20/30. Cranial nerves: CN I: not tested CN II: pupils equal, round and reactive to light, visual fields intact, fundi unremarkable. CN III, IV, VI:  full range of motion, no nystagmus, no ptosis CN V: endorses reduced sensation on left (baseline) CN VII: upper and lower face symmetric CN VIII: hearing intact CN IX, X: gag intact, uvula midline CN XI: sternocleidomastoid and trapezius muscles intact CN XII: tongue midline Bulk & Tone: normal, no fasciculations. Motor: 5/5 throughout Sensation: reduced temperature sensation in feet, vibration intact Deep Tendon Reflexes: 2+ throughout Finger to nose testing: no dysmetria, does have a little kinetic tremor Gait: wide-based, cannot tandem. Romberg negative.  IMPRESSION & PLAN: Lorelai  Carmon Ginsberg Loiselle is a 62 y.o. female with multiple issues, possibly related to recurrent transient ischemic attacks, which in term may be secondary to polycythemia vera.  Depression may play a role, as well. 1.  Benign Positional Vertigo 2.  Dementia (memory and procedural), likely vascular dementia 3.  Orthostatic hypotension 4.  Esophogeal dysphagia 5.  Migraines. 6.  Ataxia  - Secondary stroke prevention: Plavix, treatment of polycythemia vera, pravastatin (LDL goal <70) - Compression stockings to treat orthostasis - Meclizine for acute severe vertigo attacks.  Not to be taken frequently or chronically. - Recommend not driving. - Pain management as per Dr. Vear Clock - Will obtain prior neuropsychological evaluation. - Follow up in 6 months.  Call with questions or concerns.  60 minutes spent with patient and husband, over 50% spent counseling and  coordinating care in this complicated case.  Thank you for allowing me to take part in the care of this patient.  Shon Millet, DO  CC:  Ileana Ladd, MD  Arlan Organ, MD

## 2013-08-04 NOTE — Patient Instructions (Addendum)
No changes at this time.  I will get a copy of your neuropsych test.  Use compression stockings and stay hydrated.  No driving. You can continue taking the meclizine for severe vertigo attacks.  Do not take it all the time, because it prevents your body from adjusting to the vertigo.  Follow up in 6 months.

## 2013-08-24 ENCOUNTER — Telehealth: Payer: Self-pay | Admitting: Internal Medicine

## 2013-08-24 DIAGNOSIS — J029 Acute pharyngitis, unspecified: Secondary | ICD-10-CM

## 2013-08-24 NOTE — Telephone Encounter (Signed)
Spoke with patient, patient aware of recs as listed below per Dr. Maple Hudson Orders have been placed and patient aware someone will contact her to schedule appt  Nothing further needed at this time

## 2013-08-24 NOTE — Telephone Encounter (Signed)
Called, spoke with pt.  Reports it is painful to swallow anything other than yogurt or liquid.  States this pain is under her adam's apple.  Pt states this has been going on for a while but has gotten progressively worse since last OV with CDY in May and has noticed it more over the past 2 wks.  Pt believes something is going on and is requesting CDY's recs on as to what do from this standpoint.  She is requesting a call back today at # provided above.  Dr. Maple Hudson, pls advise.  Thank you.

## 2013-08-24 NOTE — Telephone Encounter (Signed)
Per CY-refer to [facility] for dx throat pain

## 2013-08-25 ENCOUNTER — Telehealth: Payer: Self-pay | Admitting: Internal Medicine

## 2013-08-25 NOTE — Telephone Encounter (Signed)
Pt returned [name]'s call & can be reached at [phone].  Natalie Hendrix

## 2013-08-25 NOTE — Telephone Encounter (Signed)
Spoke to pt she has appt to see dr Flo Shanks 08/27/13@2 :00pm Tobe Sos

## 2013-08-25 NOTE — Telephone Encounter (Signed)
Katie, did you call the pt? Please advise, thanks!

## 2013-08-26 ENCOUNTER — Ambulatory Visit: Payer: Medicare Other | Admitting: Family Medicine

## 2013-08-31 ENCOUNTER — Other Ambulatory Visit: Payer: Self-pay | Admitting: Otolaryngology

## 2013-08-31 DIAGNOSIS — R1310 Dysphagia, unspecified: Secondary | ICD-10-CM

## 2013-09-01 ENCOUNTER — Ambulatory Visit: Payer: Medicare Other | Admitting: Family Medicine

## 2013-09-06 ENCOUNTER — Other Ambulatory Visit: Payer: Medicare Other

## 2013-09-08 ENCOUNTER — Ambulatory Visit (HOSPITAL_BASED_OUTPATIENT_CLINIC_OR_DEPARTMENT_OTHER): Payer: Medicare Other | Admitting: Hematology & Oncology

## 2013-09-08 ENCOUNTER — Ambulatory Visit (HOSPITAL_BASED_OUTPATIENT_CLINIC_OR_DEPARTMENT_OTHER)
Admission: RE | Admit: 2013-09-08 | Discharge: 2013-09-08 | Disposition: A | Discharge status: Home / Self Care | Payer: Medicare Other | Source: Ambulatory Visit | Attending: Hematology & Oncology | Admitting: Hematology & Oncology

## 2013-09-08 ENCOUNTER — Ambulatory Visit (HOSPITAL_BASED_OUTPATIENT_CLINIC_OR_DEPARTMENT_OTHER): Payer: Medicare Other | Admitting: Lab

## 2013-09-08 VITALS — BP 119/70 | HR 81 | Temp 98.0°F | Resp 16 | Ht 66.0 in | Wt 203.0 lb

## 2013-09-08 DIAGNOSIS — D45 Polycythemia vera: Secondary | ICD-10-CM

## 2013-09-08 DIAGNOSIS — IMO0001 Reserved for inherently not codable concepts without codable children: Secondary | ICD-10-CM

## 2013-09-08 DIAGNOSIS — M25519 Pain in unspecified shoulder: Secondary | ICD-10-CM | POA: Insufficient documentation

## 2013-09-08 DIAGNOSIS — E568 Deficiency of other vitamins: Secondary | ICD-10-CM

## 2013-09-08 LAB — CBC WITH DIFFERENTIAL (CANCER CENTER ONLY)
BASO#: 0 10*3/uL (ref 0.0–0.2)
EOS%: 2.6 % (ref 0.0–7.0)
HCT: 33.9 % — ABNORMAL LOW (ref 34.8–46.6)
HGB: 10.2 g/dL — ABNORMAL LOW (ref 11.6–15.9)
MCH: 20.9 pg — ABNORMAL LOW (ref 26.0–34.0)
MCHC: 30.1 g/dL — ABNORMAL LOW (ref 32.0–36.0)
MCV: 70 fL — ABNORMAL LOW (ref 81–101)
MONO%: 10.6 % (ref 0.0–13.0)
NEUT%: 57.5 % (ref 39.6–80.0)

## 2013-09-08 NOTE — Progress Notes (Signed)
This office note has been dictated.

## 2013-09-09 NOTE — Progress Notes (Signed)
DIAGNOSIS:  Polycythemia vera, JAK2 negative.  CURRENT THERAPY: 1. Phlebotomy to maintain hematocrit less than 38%. 2. Plavix 75 mg p.o. daily.  INTERIM HISTORY:  Ms. Chilton comes in for followup.  Unfortunately, she fell over the weekend.  She fell on her left side.  She is having a lot of left shoulder pain.  She has not gone to the ER or seen any other doctor yet.  She is supposed to have Dr. Amanda Pea of [facility] do a trigger finger on her left hand.  We will have to call him to see if he can see her for this left shoulder issue.  Otherwise, she has been holding her own.  She has some occasional right upper quadrant abdominal pain.  She has had a lot of studies for this. She has had scans.  She has had an MRI.  She has not noted any problems with bleeding.  There is no problems with bowels or bladder.  There has been no leg swelling.  She has not noticed any kind of double vision or blurred vision.  She clearly is iron deficient.  Her last ferritin was 3.  PHYSICAL EXAMINATION:  General:  This is a well-developed, well- nourished white female in no obvious distress.  Vital Signs: Temperature of 98, pulse 81, respiratory rate 16, blood pressure 119/70. Weight is 203 pounds.  Head and Neck:  Normocephalic, atraumatic skull. There are no ocular or oral lesions.  There is no palpable cervical or supraclavicular lymph nodes.  Lungs:  Clear bilaterally.  Cardiac: Regular rate and rhythm with a normal S1, S2.  There are no murmurs, rubs or bruits.  Abdomen:  Soft.  She has good bowel sounds.  There is some slight tenderness in the right upper quadrant.  No distinct masses noted in the right upper quadrant.  There is no fluid wave.  There is no palpable hepatosplenomegaly.  Extremities:  Show some slight swelling in the left upper arm.  There is some tenderness to palpation in the proximal left humerus.  Range of motion of the shoulder does not seem to be all that  bad.  There is no tenderness over the actual shoulder.  She has good strength in her left arm.  There is no swelling in her legs. Neurological:  Shows no focal neurological deficits.  There may be some slight ataxia.  LABORATORY STUDIES:  White cell count of 5, hemoglobin 10.2, hematocrit 33.9, platelet count 277.  MCV 70.  IMPRESSION:  Ms. Tomaro is a 61 year old white female with polycythemia. Again, we are keeping her hematocrit below 38%.  The problem now is that she has this painful left upper arm and shoulder.  We will have to see what the x-ray shows.  I really think she has to get to her orthopedist.  We will make a call in to him.  Otherwise, I will plan to see her back in another 2 months.    ______________________________ Josph Macho, M.D. PRE/MEDQ  D:  09/08/2013  T:  09/09/2013  Job:  [id number]

## 2013-09-10 ENCOUNTER — Ambulatory Visit
Admission: RE | Admit: 2013-09-10 | Discharge: 2013-09-10 | Disposition: A | Discharge status: Home / Self Care | Payer: Medicare Other | Source: Ambulatory Visit | Attending: Otolaryngology | Admitting: Otolaryngology

## 2013-09-10 ENCOUNTER — Telehealth: Payer: Self-pay | Admitting: Hematology & Oncology

## 2013-09-10 DIAGNOSIS — R1310 Dysphagia, unspecified: Secondary | ICD-10-CM

## 2013-09-10 NOTE — Telephone Encounter (Signed)
Pt called wants to cx 12-16 with Dr. Terrace Arabia. Denese Killings at Dr. Zannie Cove office will cx appointment

## 2013-09-13 ENCOUNTER — Telehealth: Payer: Self-pay | Admitting: Hematology & Oncology

## 2013-09-13 NOTE — Telephone Encounter (Addendum)
Faxed Medical Records via fax today to:  [facility] Ph: [phone] Fx: [phone]   Medical  Records requested from lab & x ray   CONSENT COPY SCANNED

## 2013-09-14 ENCOUNTER — Telehealth: Payer: Self-pay | Admitting: Family Medicine

## 2013-09-14 NOTE — Telephone Encounter (Signed)
Needs her Zetia refilled.please call to let her know when it is called in.  This is for 90 days supply. Information needed is name ,dob ,last four of ss.   Please fax to  [facility]  meds by mail. [phone]

## 2013-09-15 MED ORDER — EZETIMIBE 10 MG PO TABS: 10.0000 mg | ORAL_TABLET | Freq: Every day | ORAL | Status: DC

## 2013-09-15 NOTE — Telephone Encounter (Signed)
Rx Refilled  

## 2013-10-04 ENCOUNTER — Encounter: Payer: Self-pay | Admitting: Family Medicine

## 2013-10-04 ENCOUNTER — Other Ambulatory Visit: Payer: Self-pay | Admitting: Family Medicine

## 2013-10-04 NOTE — Telephone Encounter (Signed)
Medication refill for one time only.  Patient needs to be seen.  Letter sent for patient to call and schedule 

## 2013-10-15 ENCOUNTER — Encounter (HOSPITAL_COMMUNITY): Payer: Self-pay | Admitting: *Deleted

## 2013-10-19 ENCOUNTER — Ambulatory Visit (INDEPENDENT_AMBULATORY_CARE_PROVIDER_SITE_OTHER): Payer: Medicare Other | Admitting: Internal Medicine

## 2013-10-19 ENCOUNTER — Ambulatory Visit (INDEPENDENT_AMBULATORY_CARE_PROVIDER_SITE_OTHER)
Admission: RE | Admit: 2013-10-19 | Discharge: 2013-10-19 | Disposition: A | Discharge status: Home / Self Care | Payer: Medicare Other | Source: Ambulatory Visit | Attending: Internal Medicine | Admitting: Internal Medicine

## 2013-10-19 ENCOUNTER — Encounter: Payer: Self-pay | Admitting: Internal Medicine

## 2013-10-19 VITALS — BP 114/68 | HR 82 | Ht 67.0 in | Wt 206.4 lb

## 2013-10-19 DIAGNOSIS — R05 Cough: Secondary | ICD-10-CM

## 2013-10-19 DIAGNOSIS — K219 Gastro-esophageal reflux disease without esophagitis: Secondary | ICD-10-CM

## 2013-10-19 DIAGNOSIS — Z23 Encounter for immunization: Secondary | ICD-10-CM

## 2013-10-19 DIAGNOSIS — J45998 Other asthma: Secondary | ICD-10-CM

## 2013-10-19 DIAGNOSIS — J45909 Unspecified asthma, uncomplicated: Secondary | ICD-10-CM

## 2013-10-19 MED ORDER — PROMETHAZINE-CODEINE 6.25-10 MG/5ML PO SYRP: 5.0000 mL | ORAL_SOLUTION | ORAL | Status: DC | PRN

## 2013-10-19 NOTE — Progress Notes (Signed)
Subjective:   07/22/11- 37 yoF here on kind referral by Dr. Ernst [place] because of asthma and history of pneumonia with cough. Husband here. She was a patient of mine in the [facility] at the old practice. She gives a history of asthma and bronchitis. Upper respiratory problems have been more evident since November of 2011. Coughs easily while eating. Coughs each morning bringing up small pellets of green. Wakes nauseated in the morning until she coughs her chest clear. Nebulizer treatments at her doctors for this has seemed very helpful and she would like to have a machine at home. Wears a mask outside and finds she is much bothered by strong odors heat and humidity. She is using Qvar 40 twice daily but blames that for burning in her mouth despite aggressive anti-thrush management. Using Beconase AQ nasal spray, a rescue inhaler once or twice daily and Tessalon pearls.  She feels distinctly better indoors where her husband has installed an extensive air filter system as well as individual room air cleaners in several rooms. She has encasings on bending and they are careful about dust control. Allergy skin testing in the past was positive without allergy vaccine. She has a history of polycythemia vera followed by Dr. Marin Olp with phlebotomy. Polycythemia is planned for cerebrovascular accident which has left her with cognitive and speech difficulties. She also has history of irregular heartbeat on Toprol. Pneumonia in 1978. No history of deep vein thrombosis or pulmonary embolism. Pneumococcal vaccine twice. Flu vaccine last year with blamed recurrent episodes of bronchitis during the winter. Thoracic steal syndrome was addressed with vascular reconstruction on the right in the 1980s. Chest x-ray report from 06/12/2011 described minimal right basilar atelectasis with no acute infiltrate identified, improved from earlier film. Never smoked. Family history a sister with severe allergy problems, and  with COPD, father worked with asbestos exposure and was a heavy smoker   08/23/11- 52 yoF never smoker, asthma and history of pneumonia with cough, complicated by past history surgery for thoracic outlet syndrome, CVA, allergic rhinitis, polycythemia. After last visit August 20, I have some concern that what she was describing was related to upper airway control after her stroke. She comes today for followup of lab work. ONOX-oxygen desaturation equal or less than 89% on room air for 15.4 minutes, less than or equal to 88% for 9.9 minutes, and lowest recorded saturation 82% on 07/29/2011. This could justify home oxygen for sleep. PFT-08/01/2011-normal baseline spirometry with some small airway response to bronchodilator, normal lung volumes, diffusion moderately reduced. FEV1/FVC 0.85, TLC 96%, DLCO 62%. Alpha I antitrypsin assessment: Normal MM. Speech therapy modified barium swallow was performed 08/01/2011 but I do not yet have the therapist report. I have counseled her care with swallowing, careful chewing and swallowing, sitting upright etc.  12/27/11-  20 yoF never smoker, asthma and history of pneumonia with cough, complicated by past history surgery for thoracic outlet syndrome, CVA, allergic rhinitis, polycythemia. Husband here  PCP Dr Jacelyn Grip After overnight oximetry she qualified for home oxygen during sleep at 2 L per minute/[facility]. They have an electrical generator and her husband is getting a backup generator. She chokes occasionally eating without obvious reflux. We discussed dysphagia and aspiration risk. Speech therapist report describes esophageal dysphagia without aspiration. A copy of this report will be forwarded to Dr. Jacelyn Grip who might consider speech therapy for swallowing training. Dr Marin Olp gave prednisone for polycythemia.  04/20/12- 39 yoF never smoker, asthma and history of pneumonia with cough, complicated by  past history surgery for thoracic outlet syndrome, CVA, allergic  rhinitis, polycythemia. Husband here    PCP Dr Modesto Charon  Husband is here  PT STATES INCREASE SOB,WHEEZING,DUE TO SEASON. HAS A DRY HACKY COUGH  She is blaming bad weather for her increased cough. They put a big air cleaner system in the home. She has been staying indoors to avoid pollen. Benzonatate helps her chronic cough. Nebulizer helps her wheezing.  Wears oxygen for sleep. Dr Myna Hidalgo continues to treat her for polycythemia. We again discussed her history of CVA and questionable diagnosis of MS. Swallowing evaluation (August 2012/filed under imaging) describes esophageal dysphagia and recommended reflux precautions but did not see aspiration, reflux or penetration. She coughed throughout the procedure. ++ Consider allergy profile next visit++  10/20/12- 60 yoF never smoker, asthma and history of pneumonia with cough, complicated by past history surgery for thoracic outlet syndrome, CVA, allergic rhinitis, polycythemia. Husband here    PCP Dr Modesto Charon  Husband is here FOLLOWS FOR: doing well as long as sticking with "plan of care".  Had flu vaccine. Blames either Spiriva or Qvar for burning feeling on her tongue and problems with her teeth. Stopped both and doing better.  She is sensitive to weather change. Has morning cough with clear or tan of mucus. Evaluated for degenerative disc disease and syncopal episodes.  04/20/13- 62 yoF never smoker, asthma and history of pneumonia with cough, complicated by past history surgery for thoracic outlet syndrome, CVA, allergic rhinitis, polycythemia. Husband here    PCP Dr Modesto Charon   Allergy profile was NEG, 4.9 total IgE Bronchitis with productive cough after an afternoon outdoors. She stayed indoors the next day and is better now. Continues oxygen 2-3 L/[facility]. Medications reviewed.these  10/19/13- 62 yoF never smoker, asthma and history of pneumonia with cough, complicated by past history surgery for thoracic outlet syndrome, CVA, GERD, allergic rhinitis,  polycythemia. Husband here    PCP Dr Florestine Avers for- c/o wheezing, SOB with exertion, hoarseness, non prod cough. Little phlegm.  Nl PFT 2012 except DLCO 62% Dtr in [place] dying of leukemia. Pt is on O2 2-3L/ [facility].  ENT/ [facility] referred her to GI/ Dr Dulce Sellar: Ba swallow> stricture and probably mild aspiration  Review of Systems- See HPI Constitutional:   No-   weight loss, night sweats, fevers, chills, fatigue, lassitude. HEENT:   frequent  headaches,  Some difficulty swallowing,, sore throat,       No-  sneezing, itching, ear ache, +nasal congestion, post nasal drip,  CV:  No-   chest pain, orthopnea, PND, swelling in lower extremities, anasarca, dizziness, palpitations Resp: + shortness of breath with exertion or at rest. +productive cough              No-  coughing up of blood.              +   change in color of mucus.  No- wheezing.   Skin: No-   rash or lesions. GI:  No-   heartburn, indigestion, abdominal pain, nausea, vomiting, GU:  MS:  No-   joint pain or swelling.   Neuro- nothing new or unusual  Psych:  No- change in mood or affect. No depression or anxiety.  No memory loss.    Objective:   Physical Exam  General- Alert, Oriented, Affect-appropriate, Distress- none acute,  Overweight. O2 2L/ [facility] Skin- rash-none, lesions- none, excoriation- none Lymphadenopathy- none Head- atraumatic            Eyes- Gross  vision intact, PERRLA, conjunctivae clear secretions            Ears- Hearing, canals            Nose- Clear, No- Septal dev, mucus, polyps, erosion, perforation             Throat- Mallampati III , mucosa clear , drainage- none, tonsils- atrophic,  Neck- flexible , trachea midline, no stridor , thyroid nl, carotid no bruit Chest - symmetrical excursion , unlabored           Heart/CV- RRR , no murmur , no gallop  , no rub, nl s1 s2                           - JVD- none , edema- none, stasis changes- none, varices- none           Lung- clear to P&A,  wheeze+upper airway, +cough- active/ dry cough with deep breath , dullness-none,                   rub- none, unlabored           Chest wall- Vascular surgery scar Right Upper Anterior chest Abd- Br/ Gen/ Rectal- Not done, not indicated Extrem- cyanosis- none, clubbing, none, atrophy- none, strength- . +Rolling walker Neuro- grossly intact to observation

## 2013-10-19 NOTE — Patient Instructions (Signed)
Flu vax  Order- CXR  Dx chronic cough  Script for cough syrup

## 2013-10-21 ENCOUNTER — Telehealth: Payer: Self-pay | Admitting: Internal Medicine

## 2013-10-21 NOTE — Telephone Encounter (Signed)
Notes Recorded by Waymon Budge, MD on 10/20/2013 at 8:59 AM CXR- old scarring changes but no new or active process seen in the    lmtcb x1

## 2013-10-22 ENCOUNTER — Encounter (HOSPITAL_COMMUNITY): Payer: Self-pay | Admitting: Pharmacy Technician

## 2013-10-22 NOTE — Telephone Encounter (Signed)
[email or web address]. Natalie Hendrix, CMA  

## 2013-10-25 NOTE — Telephone Encounter (Signed)
Pt spouse advised. Jennifer Castillo, CMA  

## 2013-10-27 ENCOUNTER — Ambulatory Visit (HOSPITAL_COMMUNITY): Payer: Medicare Other | Admitting: Certified Registered Nurse Anesthetist

## 2013-10-27 ENCOUNTER — Encounter (HOSPITAL_COMMUNITY): Payer: Medicare Other | Admitting: Certified Registered Nurse Anesthetist

## 2013-10-27 ENCOUNTER — Other Ambulatory Visit: Payer: Self-pay | Admitting: Gastroenterology

## 2013-10-27 ENCOUNTER — Encounter (HOSPITAL_COMMUNITY): Payer: Self-pay

## 2013-10-27 ENCOUNTER — Ambulatory Visit (HOSPITAL_COMMUNITY)
Admission: RE | Admit: 2013-10-27 | Discharge: 2013-10-27 | Disposition: A | Discharge status: Home / Self Care | Payer: Medicare Other | Source: Ambulatory Visit | Attending: Gastroenterology | Admitting: Gastroenterology

## 2013-10-27 ENCOUNTER — Encounter (HOSPITAL_COMMUNITY)
Admission: RE | Disposition: A | Discharge status: Home / Self Care | Payer: Self-pay | Source: Ambulatory Visit | Attending: Gastroenterology

## 2013-10-27 DIAGNOSIS — R1310 Dysphagia, unspecified: Secondary | ICD-10-CM | POA: Insufficient documentation

## 2013-10-27 DIAGNOSIS — J4489 Other specified chronic obstructive pulmonary disease: Secondary | ICD-10-CM | POA: Insufficient documentation

## 2013-10-27 DIAGNOSIS — Z8673 Personal history of transient ischemic attack (TIA), and cerebral infarction without residual deficits: Secondary | ICD-10-CM | POA: Insufficient documentation

## 2013-10-27 DIAGNOSIS — K219 Gastro-esophageal reflux disease without esophagitis: Secondary | ICD-10-CM | POA: Insufficient documentation

## 2013-10-27 DIAGNOSIS — Z9981 Dependence on supplemental oxygen: Secondary | ICD-10-CM | POA: Insufficient documentation

## 2013-10-27 DIAGNOSIS — IMO0001 Reserved for inherently not codable concepts without codable children: Secondary | ICD-10-CM | POA: Insufficient documentation

## 2013-10-27 DIAGNOSIS — J449 Chronic obstructive pulmonary disease, unspecified: Secondary | ICD-10-CM | POA: Insufficient documentation

## 2013-10-27 DIAGNOSIS — I10 Essential (primary) hypertension: Secondary | ICD-10-CM | POA: Insufficient documentation

## 2013-10-27 HISTORY — DX: Other complications of anesthesia, initial encounter: T88.59XA

## 2013-10-27 HISTORY — PX: ESOPHAGOGASTRODUODENOSCOPY (EGD) WITH PROPOFOL: SHX5813

## 2013-10-27 HISTORY — DX: Adverse effect of unspecified anesthetic, initial encounter: T41.45XA

## 2013-10-27 HISTORY — PX: BALLOON DILATION: SHX5330

## 2013-10-27 SURGERY — ESOPHAGOGASTRODUODENOSCOPY (EGD) WITH PROPOFOL
Anesthesia: Monitor Anesthesia Care

## 2013-10-27 MED ORDER — PROPOFOL 10 MG/ML IV BOLUS
INTRAVENOUS | Status: AC
  Filled 2013-10-27: qty 20

## 2013-10-27 MED ORDER — BUTAMBEN-TETRACAINE-BENZOCAINE 2-2-14 % EX AERO
INHALATION_SPRAY | CUTANEOUS | Status: DC | PRN
  Administered 2013-10-27: 1 via TOPICAL

## 2013-10-27 MED ORDER — LACTATED RINGERS IV SOLN
INTRAVENOUS | Status: DC
  Administered 2013-10-27: 09:00:00 via INTRAVENOUS

## 2013-10-27 MED ORDER — LIDOCAINE HCL (CARDIAC) 20 MG/ML IV SOLN
INTRAVENOUS | Status: DC | PRN
  Administered 2013-10-27: 75 mg via INTRAVENOUS

## 2013-10-27 MED ORDER — PROPOFOL 10 MG/ML IV BOLUS
INTRAVENOUS | Status: DC | PRN
  Administered 2013-10-27 (×2): 20 mg via INTRAVENOUS

## 2013-10-27 MED ORDER — LACTATED RINGERS IV SOLN
INTRAVENOUS | Status: DC | PRN
  Administered 2013-10-27: 09:00:00 via INTRAVENOUS

## 2013-10-27 MED ORDER — SODIUM CHLORIDE 0.9 % IV SOLN: INTRAVENOUS | Status: DC

## 2013-10-27 SURGICAL SUPPLY — 14 items

## 2013-10-27 NOTE — Anesthesia Preprocedure Evaluation (Addendum)
Anesthesia Evaluation  Patient identified by MRN, date of birth, ID band Patient awake    Reviewed: Allergy & Precautions, H&P , NPO status , Patient's Chart, lab work & pertinent test results  History of Anesthesia Complications (+) history of anesthetic complications  Airway Mallampati: II TM Distance: >3 FB Neck ROM: Full    Dental no notable dental hx.    Pulmonary asthma , COPD oxygen dependent,  CXR: 10-19-13: postsurgical changes right apex. No acute disease.  Oxygen 2.5 L/minute 24 hours per day. breath sounds clear to auscultation  Pulmonary exam normal       Cardiovascular Exercise Tolerance: Good hypertension, Pt. on medications and Pt. on home beta blockers negative cardio ROS  Rhythm:Regular Rate:Normal     Neuro/Psych  Headaches, PSYCHIATRIC DISORDERS Depression Difficulty with gait, balance, swallowing, speaking, singing.  Neuromuscular disease CVA, Residual Symptoms    GI/Hepatic Neg liver ROS, GERD-  ,  Endo/Other  negative endocrine ROS  Renal/GU negative Renal ROS  negative genitourinary   Musculoskeletal  (+) Fibromyalgia -, narcotic dependent  Abdominal   Peds negative pediatric ROS (+)  Hematology  (+) Blood dyscrasia, ,   Anesthesia Other Findings   Reproductive/Obstetrics negative OB ROS                          Anesthesia Physical Anesthesia Plan  ASA: IV  Anesthesia Plan: MAC   Post-op Pain Management:    Induction: Intravenous  Airway Management Planned:   Additional Equipment:   Intra-op Plan:   Post-operative Plan: Extubation in OR  Informed Consent: I have reviewed the patients History and Physical, chart, labs and discussed the procedure including the risks, benefits and alternatives for the proposed anesthesia with the patient or authorized representative who has indicated his/her understanding and acceptance.   Dental advisory given  Plan  Discussed with: CRNA  Anesthesia Plan Comments:        Anesthesia Quick Evaluation

## 2013-10-27 NOTE — Transfer of Care (Signed)
Immediate Anesthesia Transfer of Care Note  Patient: Natalie Hendrix  Procedure(s) Performed: Procedure(s): ESOPHAGOGASTRODUODENOSCOPY (EGD) WITH PROPOFOL (N/A) BALLOON DILATION (N/A)  Patient Location: PACU and Endoscopy Unit  Anesthesia Type:MAC  Level of Consciousness: awake, oriented, lethargic and responds to stimulation  Airway & Oxygen Therapy: Patient Spontanous Breathing and Patient connected to nasal cannula oxygen  Post-op Assessment: Report given to PACU RN, Post -op Vital signs reviewed and stable and Patient moving all extremities  Post vital signs: Reviewed and stable  Complications: No apparent anesthesia complications

## 2013-10-27 NOTE — Preoperative (Signed)
Beta Blockers   Reason not to administer Beta Blockers:Not Applicable 

## 2013-10-27 NOTE — Op Note (Signed)
[facility] [address] [place], [address]   ENDOSCOPY PROCEDURE REPORT  PATIENT: Natalie Hendrix, Natalie Hendrix  MR#: [id number] BIRTHDATE: 22-Feb-1951 , 62  yrs. old GENDER: Female ENDOSCOPIST: Willis Modena, MD REFERRED BY:  Flo Shanks, M.D. PROCEDURE DATE:  10/27/2013 PROCEDURE:  EGD, diagnostic ASA CLASS:     Class III INDICATIONS:  dysphagia, abnormal barium esophagram. MEDICATIONS: MAC sedation, administered by CRNA TOPICAL ANESTHETIC: Cetacaine Spray  DESCRIPTION OF PROCEDURE: After the risks benefits and alternatives of the procedure were thoroughly explained, informed consent was obtained.  The diagnostic forward-viewing endoscope was introduced through the mouth and advanced to the second portion of the duodenum. Without limitations.  The instrument was slowly withdrawn as the mucosa was fully examined.     Findings:  Pooling of secretions in oropharynx, reminiscent of oropharyngeal dysphagia.  Esophagus proper was normal; specifically, there was no esophageal stricture and no mucosal features of eosinophilic esophagitis were identified.    Stomach was normal, including retroflexion into the cardia.  Normal pylorus and duodenum to the second portion.         The scope was then withdrawn from the patient and the procedure completed.  ENDOSCOPIC IMPRESSION:     As above.  Suspect dysphagia is predominantly oropharyngeal, with possible dysmotility component as well.  No esophageal stricture was identified.  RECOMMENDATIONS:     1.  Watch for potential complications of procedure. 2.  Follow-up with Speech Therapy for ongoing management for oropharyngeal dysphagia. 3.  OK to restart clopidigrel today. 4.  Follow-up with [facility] on as-needed basis.  eSigned:  Willis Modena, MD 10/27/2013 10:07 AM   CC:

## 2013-10-27 NOTE — Anesthesia Postprocedure Evaluation (Signed)
  Anesthesia Post-op Note  Patient: Natalie Hendrix  Procedure(s) Performed: Procedure(s) (LRB): ESOPHAGOGASTRODUODENOSCOPY (EGD) WITH PROPOFOL (N/A) BALLOON DILATION (N/A)  Patient Location: PACU  Anesthesia Type: MAC  Level of Consciousness: awake and alert   Airway and Oxygen Therapy: Patient Spontanous Breathing  Post-op Pain: mild  Post-op Assessment: Post-op Vital signs reviewed, Patient's Cardiovascular Status Stable, Respiratory Function Stable, Patent Airway and No signs of Nausea or vomiting  Last Vitals:  Filed Vitals:   10/27/13 0959  BP: 156/92  Pulse: 80  Temp: 37 C  Resp: 20    Post-op Vital Signs: stable   Complications: No apparent anesthesia complications

## 2013-10-27 NOTE — H&P (Signed)
Patient interval history reviewed.  Patient examined again.  There has been no change from documented H/P dated 10/15/13 (scanned into chart from our office) except as documented above.  Assessment:  1.  Dysphagia, abnormal barium swallow.  Plan:  1.  EGD with possible esophageal balloon dilatation. 2.  Risks (bleeding, infection, bowel perforation that could require surgery, sedation-related changes in cardiopulmonary systems), benefits (identification and possible treatment of source of symptoms, exclusion of certain causes of symptoms), and alternatives (watchful waiting, radiographic imaging studies, empiric medical treatment) of upper endoscopy with possible esophageal dilatation (EGD +/- DIL) were explained to patient/family in detail and patient wishes to proceed.

## 2013-11-01 ENCOUNTER — Encounter (HOSPITAL_COMMUNITY): Payer: Self-pay | Admitting: Gastroenterology

## 2013-11-04 ENCOUNTER — Telehealth: Payer: Self-pay | Admitting: Internal Medicine

## 2013-11-04 MED ORDER — PROMETHAZINE-CODEINE 6.25-10 MG/5ML PO SYRP: 5.0000 mL | ORAL_SOLUTION | ORAL | Status: DC | PRN

## 2013-11-04 NOTE — Telephone Encounter (Signed)
I called and spoke with pt spouse. She reports 1 1/2 week ago they lost their daughter d/t leukemia. Pt came back from being out of town and went to get her phenergan w/ codeine cough syrup filled but can't find RX. Pt is coughing a lot now since all this has happened and needs a new RX. Please advise Dr. Maple Hudson if okay to do so thanks

## 2013-11-04 NOTE — Telephone Encounter (Signed)
Pt's husband returning call can be reached at [phone].Natalie Hendrix

## 2013-11-04 NOTE — Telephone Encounter (Signed)
RX printed and placed on CDY cart for signature. Spouse will come tomorrow and pick this up. Nothing further needed

## 2013-11-04 NOTE — Telephone Encounter (Signed)
lmomtcb x1 

## 2013-11-04 NOTE — Telephone Encounter (Signed)
Per CY-sorry to hear of her loss and our thoughts are with her during this time. Also, okay to refill her cough syrup.

## 2013-11-05 NOTE — Assessment & Plan Note (Signed)
Reflux management per GI

## 2013-11-05 NOTE — Assessment & Plan Note (Signed)
Probably cyclical cough related to her demonstrated reflux/ GERD as discussed.

## 2013-11-10 ENCOUNTER — Ambulatory Visit (HOSPITAL_BASED_OUTPATIENT_CLINIC_OR_DEPARTMENT_OTHER): Payer: Medicare Other | Admitting: Hematology & Oncology

## 2013-11-10 ENCOUNTER — Other Ambulatory Visit (HOSPITAL_BASED_OUTPATIENT_CLINIC_OR_DEPARTMENT_OTHER): Payer: Medicare Other | Admitting: Lab

## 2013-11-10 VITALS — BP 114/58 | HR 85 | Temp 98.2°F | Resp 14 | Ht 67.0 in | Wt 202.0 lb

## 2013-11-10 DIAGNOSIS — IMO0001 Reserved for inherently not codable concepts without codable children: Secondary | ICD-10-CM

## 2013-11-10 DIAGNOSIS — D45 Polycythemia vera: Secondary | ICD-10-CM

## 2013-11-10 DIAGNOSIS — D649 Anemia, unspecified: Secondary | ICD-10-CM

## 2013-11-10 LAB — COMPREHENSIVE METABOLIC PANEL
AST: 24 U/L (ref 0–37)
BUN: 15 mg/dL (ref 6–23)
Calcium: 9.1 mg/dL (ref 8.4–10.5)
Chloride: 105 mEq/L (ref 96–112)
Creatinine, Ser: 0.76 mg/dL (ref 0.50–1.10)
Glucose, Bld: 90 mg/dL (ref 70–99)
Sodium: 138 mEq/L (ref 135–145)

## 2013-11-10 LAB — CBC WITH DIFFERENTIAL (CANCER CENTER ONLY)
BASO#: 0 10*3/uL (ref 0.0–0.2)
EOS%: 2.1 % (ref 0.0–7.0)
HCT: 35.2 % (ref 34.8–46.6)
HGB: 10.7 g/dL — ABNORMAL LOW (ref 11.6–15.9)
LYMPH%: 21.6 % (ref 14.0–48.0)
MCH: 20.9 pg — ABNORMAL LOW (ref 26.0–34.0)
MCHC: 30.4 g/dL — ABNORMAL LOW (ref 32.0–36.0)
MCV: 69 fL — ABNORMAL LOW (ref 81–101)
MONO%: 9.7 % (ref 0.0–13.0)
NEUT%: 65.9 % (ref 39.6–80.0)
Platelets: 294 10*3/uL (ref 145–400)

## 2013-11-10 NOTE — Progress Notes (Signed)
This office note has been dictated.

## 2013-11-11 ENCOUNTER — Telehealth: Payer: Self-pay | Admitting: Hematology & Oncology

## 2013-11-11 ENCOUNTER — Other Ambulatory Visit (HOSPITAL_COMMUNITY): Payer: Self-pay | Admitting: Gastroenterology

## 2013-11-11 DIAGNOSIS — R1310 Dysphagia, unspecified: Secondary | ICD-10-CM

## 2013-11-11 LAB — FERRITIN CHCC: Ferritin: 4 ng/ml — ABNORMAL LOW (ref 9–269)

## 2013-11-11 NOTE — Telephone Encounter (Signed)
Left message with 12-23-13 appointment

## 2013-11-12 ENCOUNTER — Ambulatory Visit (INDEPENDENT_AMBULATORY_CARE_PROVIDER_SITE_OTHER): Payer: Medicare Other | Admitting: Family Medicine

## 2013-11-12 ENCOUNTER — Encounter: Payer: Self-pay | Admitting: Family Medicine

## 2013-11-12 VITALS — BP 110/74 | HR 74 | Temp 98.6°F | Resp 20 | Wt 202.0 lb

## 2013-11-12 DIAGNOSIS — Z23 Encounter for immunization: Secondary | ICD-10-CM

## 2013-11-12 DIAGNOSIS — Z0000 Encounter for general adult medical examination without abnormal findings: Secondary | ICD-10-CM

## 2013-11-12 NOTE — Progress Notes (Signed)
Subjective:    Patient ID: Natalie Hendrix, female    DOB: [birth year], 62 y.o.   MRN: [id number]  HPI  Patient is here today for complete physical exam and to reestablish care.  Unfortunately her daughter recently died from non-Hodgkin's lymphoma. The patient is having a difficult time dealing with the death of her daughter.  Regarding her ongoing medical issues she is currently being seen by Dr. Dulce Sellar. They're scheduling a modified barium swallow to evaluate for possible aspiration as a cause of her chronic cough. They're concerned because she has a history of a brainstem CVA.  Her colonoscopy is not due for another 3 years. He is deferring her mammogram for now but she will schedule that after the first of the year. Her Pap smear was done last year. She had Pneumovax 23. She has had her flu shot. She has not had Prevnar 13 or zostavax.  Her gynecologist performs her Pap smear and her bone density test. Her bone density test is due again next year. Her biggest concern today is to have her fasting blood work checked. Past Medical History  Diagnosis Date  . GERD (gastroesophageal reflux disease)   . Hyperlipidemia   . Depression   . Allergy   . Dizziness   . Vertigo   . Migraine headache   . Fluttering heart   . Hypertension   . Allergic rhinitis   . Polycythemia rubra vera   . Complication of anesthesia     very sensitive to sedatives, B/P drops  . CVA (cerebral vascular accident)     left side weakness residual, uses mobile chair, cane  . Asthma     uses oxygen 2.5 l/m 24/7, sleeps elevated  . Thoracic outlet syndrome     '81-repair right side  . Fibromyalgia    Past Surgical History  Procedure Laterality Date  . Appendectomy    . Resection rib partial    . Gallbladder surgery    . Breast surgery  1979  . Synonectomy    . Dilation and curettage of uterus    . Root resection and revascularization  1980    of Long thoracic artery  . Rhinoplasty    . Cyst removal hand  Bilateral     left done 10-13-13(Dr. Gramig)  . Esophagogastroduodenoscopy (egd) with propofol N/A 10/27/2013    Procedure: ESOPHAGOGASTRODUODENOSCOPY (EGD) WITH PROPOFOL;  Surgeon: Willis Modena, MD;  Location: [facility];  Service: Endoscopy;  Laterality: N/A;  . Balloon dilation N/A 10/27/2013    Procedure: BALLOON DILATION;  Surgeon: Willis Modena, MD;  Location: [facility];  Service: Endoscopy;  Laterality: N/A;   Current Outpatient Prescriptions on File Prior to Visit  Medication Sig Dispense Refill  . albuterol (PROVENTIL HFA;VENTOLIN HFA) 108 (90 BASE) MCG/ACT inhaler Inhale 2 puffs into the lungs every 4 (four) hours as needed for wheezing or shortness of breath.      Marland Kitchen albuterol (PROVENTIL) (2.5 MG/3ML) 0.083% nebulizer solution Take 2.5 mg by nebulization every 6 (six) hours as needed for wheezing or shortness of breath.      Marland Kitchen azelastine (ASTELIN) 137 MCG/SPRAY nasal spray Place 1 spray into the nose 2 (two) times daily. Use in each nostril as directed      . beclomethasone (BECONASE-AQ) 42 MCG/SPRAY nasal spray Place 2 sprays into the nose 2 (two) times daily. Dose is for each nostril.      . benzonatate (TESSALON) 100 MG capsule Take 100 mg by mouth every 6 (six) hours as  needed for cough.      Marland Kitchen buPROPion (WELLBUTRIN XL) 300 MG 24 hr tablet Take 300 mg by mouth daily with breakfast.       . Calcium Carbonate-Vit D-Min (CALCIUM 1200 PO) Take 2,400 mg by mouth daily.       . clopidogrel (PLAVIX) 75 MG tablet Take 75 mg by mouth daily.      Marland Kitchen co-enzyme Q-10 30 MG capsule Take 30 mg by mouth daily.       . cyclobenzaprine (FLEXERIL) 10 MG tablet Take 10 mg by mouth 3 (three) times daily as needed for muscle spasms.       Andrey Campanile VAGINAL 0.1 MG/GM vaginal cream Place 1 Applicatorful vaginally daily.       . fexofenadine (ALLEGRA) 180 MG tablet Take 180 mg by mouth daily as needed for allergies. As needed      . Flaxseed, Linseed, (FLAXSEED OIL) 1200 MG CAPS Take 1 capsule by  mouth 1 day or 1 dose.       . Grape Seed Extract 30 MG CAPS Take 1 capsule by mouth every morning.       . lidocaine (LIDODERM) 5 % Place 1 patch onto the skin as needed (pain). Remove & Discard patch within 12 hours or as directed by MD      . LORazepam (ATIVAN) 0.5 MG tablet Take 0.5 mg by mouth 2 (two) times daily. Take 2 tabs in morning and 2 tablets at bedtime      . magnesium gluconate (MAGONATE) 500 MG tablet Take 500 mg by mouth daily as needed (constipation).       . meclizine (ANTIVERT) 25 MG tablet Take 25 mg by mouth 3 (three) times daily as needed for dizziness.       . metoprolol (TOPROL-XL) 50 MG 24 hr tablet Take 50 mg by mouth daily with breakfast.       . Misc Natural Products (TART CHERRY ADVANCED PO) Take 5 mLs by mouth daily. Juice Concentrate- 1tsp daily      . morphine (MS CONTIN) 30 MG 12 hr tablet Take 15 mg by mouth 2 (two) times daily.       Marland Kitchen morphine (MSIR) 15 MG tablet Take 15 mg by mouth every 4 (four) hours as needed for moderate pain or severe pain. As needed for migraines      . Omega-3 Fatty Acids (FISH OIL) 1200 MG CAPS Take 2,400 mg by mouth 2 (two) times daily.      . pantoprazole (PROTONIX) 40 MG tablet Take 40 mg by mouth daily.       . pravastatin (PRAVACHOL) 40 MG tablet Take 40 mg by mouth at bedtime.       . promethazine-codeine (PHENERGAN WITH CODEINE) 6.25-10 MG/5ML syrup Take 5 mLs by mouth every 4 (four) hours as needed for cough.  200 mL  0  . Respiratory Therapy Supplies (NEBULIZER) DEVI Use as directed  1 each  0  . riboflavin (VITAMIN B-2) 100 MG TABS Take 200 mg by mouth 2 (two) times daily before a meal.      . Turmeric Curcumin 500 MG CAPS Take 1 capsule by mouth daily.      Marland Kitchen ZETIA 10 MG tablet TAKE 1 TABLET BY MOUTH DAILY.  30 tablet  0  . ZOVIRAX 5 % Apply 1 application topically as needed (flair).        No current facility-administered medications on file prior to visit.   Allergies  Allergen Reactions  .  Qvar [Beclomethasone]   .  Spiriva [Tiotropium Bromide Monohydrate]     rash  . Chlorzoxazone   . Epinephrine     Raises heart rate  . Lamotrigine   . Levofloxacin   . Neurontin [Gabapentin]     arthralgia  . Protriptyline Hcl     Severe constipation  . Ropinirole Hcl     arthralgia  . Tizanidine     panic  . Verapamil   . Zonegran     Mood swings  . Advair Diskus [Fluticasone-Salmeterol] Rash  . Baclofen Rash  . Chlorzoxazone Rash  . Levofloxacin Rash   History   Social History  . Marital Status: Married    Spouse Name: N/A    Number of Children: N  . Years of Education: N/A   Occupational History  . RETIRED     occupational hand therapist   Social History Main Topics  . Smoking status: Never Smoker   . Smokeless tobacco: Never Used  . Alcohol Use: No  . Drug Use: No  . Sexual Activity: Not on file   Other Topics Concern  . Not on file   Social History Narrative  . No narrative on file   Family History  Problem Relation Age of Onset  . Cervical cancer Mother   . Heart disease Mother   . Hypertension Mother   . Stroke Other     aunt  . Hypertension Other     aunt  . Stroke Other     uncle  . Heart attack Other     uncle  . Emphysema Other     aunt  . Emphysema Father   . Allergies      aunt and sister  . Cancer Mother   . Clotting disorder Mother     and aunts x 2  . Rheum arthritis      grandmother     Review of Systems  All other systems reviewed and are negative.       Objective:   Physical Exam  Vitals reviewed. Constitutional: She is oriented to person, place, and time. She appears well-developed and well-nourished. No distress.  HENT:  Head: Normocephalic and atraumatic.  Right Ear: External ear normal.  Left Ear: External ear normal.  Nose: Nose normal.  Mouth/Throat: Oropharynx is clear and moist. No oropharyngeal exudate.  Eyes: Conjunctivae and EOM are normal. Pupils are equal, round, and reactive to light. Right eye exhibits no discharge. Left  eye exhibits no discharge. No scleral icterus.  Neck: Normal range of motion. Neck supple. No JVD present. No tracheal deviation present. No thyromegaly present.  Cardiovascular: Normal rate, regular rhythm and normal heart sounds.  Exam reveals no gallop and no friction rub.   No murmur heard. Pulmonary/Chest: Effort normal and breath sounds normal. No respiratory distress. She has no wheezes. She has no rales. She exhibits no tenderness.  Abdominal: Soft. Bowel sounds are normal. She exhibits no distension and no mass. There is no tenderness. There is no rebound and no guarding.  Musculoskeletal: Normal range of motion. She exhibits no edema and no tenderness.  Lymphadenopathy:    She has no cervical adenopathy.  Neurological: She is alert and oriented to person, place, and time. She has normal reflexes. She displays normal reflexes. No cranial nerve deficit. She exhibits abnormal muscle tone. Coordination abnormal.  Skin: Skin is warm. No rash noted. She is not diaphoretic. No erythema. No pallor.  Psychiatric: She has a normal mood and affect. Her behavior  is normal. Judgment and thought content normal.          Assessment & Plan:  1. Routine general medical examination at a health care facility Physical exam is significant only for her slurred speech and balance difficulties.  Otherwise she is doing well. I will check a CMP fasting lipid panel TSH and vitamin D and a CBC.  The patient also received Prevnar 13 today. I recommended that she receive a Zostavax. She will inquire as to the price prior to receiving the vaccine. Her other cancer screening is up to date. The patient will schedule her mammogram after the first of the year. - COMPLETE METABOLIC PANEL WITH GFR - Lipid panel - TSH - Vit D  25 hydroxy (rtn osteoporosis monitoring) - CBC with Differential  2. Need for prophylactic vaccination against Streptococcus pneumoniae (pneumococcus) - Pneumococcal conjugate vaccine  13-valent

## 2013-11-12 NOTE — Progress Notes (Signed)
CC:   Claude Manges, MD  DIAGNOSIS:  Polycythemia vera, JAK2-negative.  CURRENT THERAPY: 1. Phlebotomy to maintain hematocrit less than 38%. 2. Plavix 75 mg p.o. daily.  INTERIM HISTORY:  Ms. Hyson comes in for followup.  Unfortunately, neurologically she seems to be a little bit worse.  It looks like she may have had a brainstem stroke.  This I am surprised by because she is on Plavix.  Her hemoglobin and hematocrit have been kept under very good control.  She certainly has a little more of expressive aphasia right now.  She has a little more difficulty walking.  There is a little bit of more unsteadiness.  She and her husband unfortunately lost a daughter recently.  She had leukemia and died of this.  It has been a very, very difficult time for the family.  She has had no fever.  There has been no nausea or vomiting.  She has had no change in bowel or bladder habits.  PHYSICAL EXAMINATION:  General:  This is a fairly well-developed, well- nourished white female, in no obvious distress.  Vital signs: Temperature of 98.2, pulse 85, respiratory rate 14, blood pressure 114/58.  Weight is 202 pounds.  Head and Neck:  She has normocephalic, atraumatic skull.  There are no ocular or oral lesions.  She has no palpable cervical or supraclavicular lymph nodes.  Lungs:  Clear bilaterally.  Cardiac:  Regular rate and rhythm with a normal S1 and S2. There are no murmurs, rubs, or bruits.  Abdomen:  Soft.  She has good bowel sounds.  There is no fluid wave.  There is no palpable abdominal mass.  There is no palpable hepatosplenomegaly.  Back:  No tenderness over the spine, ribs, or hips.  Extremities:  No clubbing, cyanosis or edema.  She has decent range of motion of her joints.  Muscle strength is 5/5 bilaterally.  Neurological exam does show some expressive aphasia.  She does have some slight ataxia.  LABORATORY STUDIES:  White cell count is 6.1, hemoglobin 10.7, hematocrit  35.2, platelet count 294.  MCV is 69.  IMPRESSION:  Ms. Buzan is a very nice 62 year old white female.  She has history of polycythemia.  Unfortunately, she now has this whole neurological thing going on.  She was endoscoped recently.  It looks like she has some esophageal dysmotility.  She seems to have oropharyngeal dysmotility.  She is going to be seen by speech pathology to try to help with this.  Again, I find it hard to believe that this neurological issue is related to her blood.  We have kept her blood nice and low.  Again, she is on the Plavix.  I think we can continue to follow her along as we are doing.  I will plan to get her back in another 6 weeks.    ______________________________ Josph Macho, M.D. PRE/MEDQ  D:  11/10/2013  T:  11/11/2013  Job:  [id number]

## 2013-11-13 LAB — COMPLETE METABOLIC PANEL WITH GFR
ALT: 17 U/L (ref 0–35)
AST: 24 U/L (ref 0–37)
Albumin: 4.1 g/dL (ref 3.5–5.2)
BUN: 12 mg/dL (ref 6–23)
CO2: 20 mEq/L (ref 19–32)
Calcium: 9 mg/dL (ref 8.4–10.5)
Chloride: 107 mEq/L (ref 96–112)
Creat: 0.72 mg/dL (ref 0.50–1.10)
GFR, Est Non African American: >89 mL/min
Potassium: 4.7 mEq/L (ref 3.5–5.3)
Sodium: 139 mEq/L (ref 135–145)
Total Protein: 6.8 g/dL (ref 6.0–8.3)

## 2013-11-13 LAB — LIPID PANEL
Cholesterol: 161 mg/dL (ref 0–200)
LDL Cholesterol: 82 mg/dL (ref 0–99)
Triglycerides: 102 mg/dL (ref <150)
VLDL: 20 mg/dL (ref 0–40)

## 2013-11-13 LAB — CBC WITH DIFFERENTIAL/PLATELET
Basophils Absolute: 0 10*3/uL (ref 0.0–0.1)
Basophils Relative: 0 % (ref 0–1)
Lymphocytes Relative: 22 % (ref 12–46)
Neutro Abs: 4 10*3/uL (ref 1.7–7.7)
Platelets: 302 10*3/uL (ref 150–400)
RDW: 17.7 % — ABNORMAL HIGH (ref 11.5–15.5)
WBC: 6 10*3/uL (ref 4.0–10.5)

## 2013-11-13 LAB — VITAMIN D 25 HYDROXY (VIT D DEFICIENCY, FRACTURES): Vit D, 25-Hydroxy: 50 ng/mL (ref 30–89)

## 2013-11-13 LAB — TSH: TSH: 0.934 u[IU]/mL (ref 0.350–4.500)

## 2013-11-16 ENCOUNTER — Encounter: Payer: Self-pay | Admitting: *Deleted

## 2013-11-16 ENCOUNTER — Ambulatory Visit: Payer: Medicare Other | Admitting: Neurology

## 2013-11-17 ENCOUNTER — Inpatient Hospital Stay (HOSPITAL_COMMUNITY): Admission: RE | Admit: 2013-11-17 | Payer: Medicare Other | Source: Ambulatory Visit

## 2013-11-17 ENCOUNTER — Ambulatory Visit (HOSPITAL_COMMUNITY): Admission: RE | Admit: 2013-11-17 | Payer: Medicare Other | Source: Ambulatory Visit

## 2013-11-18 ENCOUNTER — Other Ambulatory Visit (HOSPITAL_COMMUNITY): Payer: Self-pay | Admitting: Gastroenterology

## 2013-11-18 DIAGNOSIS — R1310 Dysphagia, unspecified: Secondary | ICD-10-CM

## 2013-11-23 ENCOUNTER — Ambulatory Visit (HOSPITAL_COMMUNITY): Payer: Medicare Other

## 2013-11-23 ENCOUNTER — Other Ambulatory Visit (HOSPITAL_COMMUNITY): Payer: Self-pay | Admitting: Gastroenterology

## 2013-11-23 ENCOUNTER — Inpatient Hospital Stay (HOSPITAL_COMMUNITY): Admission: RE | Admit: 2013-11-23 | Payer: Medicare Other | Source: Ambulatory Visit

## 2013-11-23 DIAGNOSIS — R1310 Dysphagia, unspecified: Secondary | ICD-10-CM

## 2013-11-30 ENCOUNTER — Inpatient Hospital Stay (HOSPITAL_COMMUNITY): Admission: RE | Admit: 2013-11-30 | Payer: Medicare Other | Source: Ambulatory Visit

## 2013-11-30 ENCOUNTER — Ambulatory Visit (HOSPITAL_COMMUNITY): Payer: Medicare Other

## 2013-12-07 DIAGNOSIS — Z0289 Encounter for other administrative examinations: Secondary | ICD-10-CM

## 2013-12-09 ENCOUNTER — Other Ambulatory Visit (HOSPITAL_COMMUNITY): Payer: Self-pay | Admitting: Gastroenterology

## 2013-12-09 DIAGNOSIS — R1310 Dysphagia, unspecified: Secondary | ICD-10-CM

## 2013-12-14 ENCOUNTER — Ambulatory Visit (HOSPITAL_COMMUNITY): Admission: RE | Admit: 2013-12-14 | Payer: Medicare Other | Source: Ambulatory Visit

## 2013-12-14 ENCOUNTER — Other Ambulatory Visit (HOSPITAL_COMMUNITY): Payer: Medicare Other

## 2013-12-17 ENCOUNTER — Ambulatory Visit (HOSPITAL_COMMUNITY)
Admission: RE | Admit: 2013-12-17 | Discharge: 2013-12-17 | Disposition: A | Discharge status: Home / Self Care | Payer: Medicare Other | Source: Ambulatory Visit | Attending: Gastroenterology | Admitting: Gastroenterology

## 2013-12-17 DIAGNOSIS — R1310 Dysphagia, unspecified: Secondary | ICD-10-CM | POA: Insufficient documentation

## 2013-12-17 NOTE — Procedures (Signed)
Objective Swallowing Evaluation: Modified Barium Swallowing Study  Patient Details  Name: Natalie Hendrix MRN: [id number] Date of Birth: 04-27-1951  Today's Date: 12/17/2013 Time: 1300-1410 SLP Time Calculation (min): 59 min  Past Medical History:  Past Medical History  Diagnosis Date  . GERD (gastroesophageal reflux disease)   . Hyperlipidemia   . Depression   . Allergy   . Dizziness   . Vertigo   . Migraine headache   . Fluttering heart   . Hypertension   . Allergic rhinitis   . Polycythemia rubra vera   . Complication of anesthesia     very sensitive to sedatives, B/P drops  . CVA (cerebral vascular accident)     left side weakness residual, uses mobile chair, cane  . Asthma     uses oxygen 2.5 l/m 24/7, sleeps elevated  . Thoracic outlet syndrome     '81-repair right side  . Fibromyalgia    Past Surgical History:  Past Surgical History  Procedure Laterality Date  . Appendectomy    . Resection rib partial    . Gallbladder surgery    . Breast surgery  1979  . Synonectomy    . Dilation and curettage of uterus    . Root resection and revascularization  1980    of Long thoracic artery  . Rhinoplasty    . Cyst removal hand Bilateral     left done 10-13-13(Dr. Gramig)  . Esophagogastroduodenoscopy (egd) with propofol N/A 10/27/2013    Procedure: ESOPHAGOGASTRODUODENOSCOPY (EGD) WITH PROPOFOL;  Surgeon: Arta Silence, MD;  Location: [facility];  Service: Endoscopy;  Laterality: N/A;  . Balloon dilation N/A 10/27/2013    Procedure: BALLOON DILATION;  Surgeon: Arta Silence, MD;  Location: [facility];  Service: Endoscopy;  Laterality: N/A;   HPI:  63 year old female referred for outpatient MBS due to globus sensation and pain with swallow.  Pt has a significant PMH, including CVA x2, voice/speech changes, dementia, neuromulscular d/o, neurological impairment, PNA, breathing difficulty (req O2), neck/throat surgery, Reflux, esophageal issues, xerostomia, and  Polycythemia vera.  Pt underwent MBS in August of 2012, which revealed normal oropharyngeal swallow with primary esophageal issues.  A Barium Swallow was completed in October 2014, which revealed narrowing of the distal esophagu per MD note.     Assessment / Plan / Recommendation Clinical Impression  Dysphagia Diagnosis: Mild pharyngeal phase dysphagia Clinical impression: Mild pharyngeal dysphagia, primarily motor based.  Pt was noted to penetrate thin liquids only when taking large boluses, tilting head back to swallow barium tablet.  The pill was then noted to sit in vallecula until subsequent swallow cleared it.  Pt does exhibit significant stasis beginning at C7/T1, which was noted on occasion to backflow into the pharynx, perilously close to entering the airway on more than one occasion.  Recommend soft foods with chopped meats for energy conservation, thin liquids, meds whole in puree, and strict adherence to safe swallow and esophageal precautions.  Particular attention to the following is highly encouraged: begin meals with warm beverage, and drink warm beverages throughout meal, holding iced beverages until between meals.  Small bites and sips at a slow rate, alternating solids and liquids.  Also recommend follow up with GI for medical management of significant esophageal issues.      Treatment Recommendation  No treatment recommended at this time    Diet Recommendation Dysphagia 3 (Mechanical Soft);Thin liquid   Liquid Administration via: Straw;Cup Medication Administration: Whole meds with puree Supervision: Patient able to self feed  Compensations: Slow rate;Small sips/bites;Follow solids with liquid (slow rate, esophageal precautions) Postural Changes and/or Swallow Maneuvers: Seated upright 90 degrees;Upright 30-60 min after meal    Other  Recommendations Recommended Consults:  (follow up per BaSw results) Oral Care Recommendations: Oral care Q4 per protocol   Follow Up  Recommendations  24 hour supervision/assistance       Pertinent Vitals/Pain VSS    SLP Swallow Goals  n/a   General Date of Onset: 12/17/13 HPI: 63 year old female referred for outpatient MBS due to globus sensation and pain with swallow.  Pt has a significant PMH, including CVA x2, voice/speech changes, dementia, neuromulscular d/o, neurological impairment, PNA, breathing difficulty (req O2), neck/throat surgery, Reflux, esophageal issues, xerostomia, and Polycythemia vera.  Pt underwent MBS in August of 2012, which revealed normal oropharyngeal swallow with primary esophageal issues.  A Barium Swallow was completed in October 2014, which revealed narrowing of the distal esophagu per MD note. Type of Study: Modified Barium Swallowing Study Reason for Referral: Objectively evaluate swallowing function Previous Swallow Assessment: MBS 07/2011, BaSw 09/2013 Diet Prior to this Study: Dysphagia 3 (soft);Thin liquids Temperature Spikes Noted: No Respiratory Status: Nasal cannula History of Recent Intubation: No Behavior/Cognition: Alert;Cooperative;Pleasant mood Oral Motor / Sensory Function: Within functional limits Self-Feeding Abilities: Able to feed self Patient Positioning: Upright in chair Baseline Vocal Quality: Clear Volitional Cough: Strong Volitional Swallow: Able to elicit Anatomy: Within functional limits Pharyngeal Secretions: Not observed secondary MBS    Reason for Referral Objectively evaluate swallowing function   Oral Phase Oral Preparation/Oral Phase Oral Phase: WFL   Pharyngeal Phase Pharyngeal Phase Pharyngeal Phase: Impaired Pharyngeal - Nectar Pharyngeal - Nectar Teaspoon: Within functional limits Pharyngeal - Thin Pharyngeal - Thin Cup: Penetration/Aspiration during swallow Penetration/Aspiration details (thin cup): Material enters airway, remains ABOVE vocal cords then ejected out (penetration seen only when swallowing barium tablet) Pharyngeal - Thin  Straw: Within functional limits Pharyngeal - Solids Pharyngeal - Puree: Within functional limits Pharyngeal - Mechanical Soft: Within functional limits Pharyngeal - Pill: Delayed swallow initiation;Premature spillage to valleculae Pharyngeal Phase - Comment Pharyngeal Comment: Oral and pharyngeal stages are safe and functional, HOWEVER, pt exhibits marked esophageal issues, beginning at C7/T1.  Cervical Esophageal Phase    GO    Cervical Esophageal Phase Cervical Esophageal Phase: Impaired Cervical Esophageal Phase - Thin Thin Cup: Reduced cricopharyngeal relaxation;Esophageal backflow into the pharynx Thin Straw: Reduced cricopharyngeal relaxation;Esophageal backflow into the pharynx Cervical Esophageal Phase - Solids Puree: Reduced cricopharyngeal relaxation;Esophageal backflow into the pharynx Mechanical Soft: Reduced cricopharyngeal relaxation;Esophageal backflow into the pharynx Pill: Reduced cricopharyngeal relaxation Cervical Esophageal Phase - Comment Cervical Esophageal Comment: Pt has known esophageal issues.  Husband reports MD identified at least 3 areas of dysmotility on recent barium swallow.    Functional Assessment Tool Used: NOMS, clinical judgment Functional Limitations: Swallowing Swallow Current Status (N4627): At least 1 percent but less than 20 percent impaired, limited or restricted Swallow Goal Status [phone]): At least 1 percent but less than 20 percent impaired, limited or restricted Swallow Discharge Status [phone]): At least 1 percent but less than 20 percent impaired, limited or restricted   Cornelio Parkerson B. Quentin Ore [facility], CCC-SLP [phone]  Shonna Chock 12/17/2013, 3:08 PM

## 2013-12-21 ENCOUNTER — Telehealth: Payer: Self-pay | Admitting: *Deleted

## 2013-12-21 MED ORDER — PANTOPRAZOLE SODIUM 40 MG PO TBEC: 40.0000 mg | DELAYED_RELEASE_TABLET | Freq: Every day | ORAL | Status: DC

## 2013-12-21 NOTE — Telephone Encounter (Signed)
Med(s) refilled at [facility] for 30 days and [facility] for 90 days

## 2013-12-23 ENCOUNTER — Ambulatory Visit (HOSPITAL_BASED_OUTPATIENT_CLINIC_OR_DEPARTMENT_OTHER): Payer: Medicare Other | Admitting: Hematology & Oncology

## 2013-12-23 ENCOUNTER — Encounter: Payer: Self-pay | Admitting: Hematology & Oncology

## 2013-12-23 ENCOUNTER — Other Ambulatory Visit (HOSPITAL_BASED_OUTPATIENT_CLINIC_OR_DEPARTMENT_OTHER): Payer: Medicare Other | Admitting: Lab

## 2013-12-23 VITALS — BP 146/71 | HR 75 | Temp 98.4°F | Resp 14 | Ht 68.0 in | Wt 203.0 lb

## 2013-12-23 DIAGNOSIS — F3289 Other specified depressive episodes: Secondary | ICD-10-CM

## 2013-12-23 DIAGNOSIS — D45 Polycythemia vera: Secondary | ICD-10-CM

## 2013-12-23 DIAGNOSIS — F329 Major depressive disorder, single episode, unspecified: Secondary | ICD-10-CM

## 2013-12-23 DIAGNOSIS — R109 Unspecified abdominal pain: Secondary | ICD-10-CM

## 2013-12-23 LAB — CBC WITH DIFFERENTIAL (CANCER CENTER ONLY)
BASO#: 0 10*3/uL (ref 0.0–0.2)
BASO%: 0.4 % (ref 0.0–2.0)
EOS%: 1.6 % (ref 0.0–7.0)
Eosinophils Absolute: 0.1 10*3/uL (ref 0.0–0.5)
HCT: 34.2 % — ABNORMAL LOW (ref 34.8–46.6)
HEMOGLOBIN: 10.1 g/dL — AB (ref 11.6–15.9)
LYMPH#: 1.2 10*3/uL (ref 0.9–3.3)
LYMPH%: 24.4 % (ref 14.0–48.0)
MCH: 20.4 pg — AB (ref 26.0–34.0)
MCHC: 29.5 g/dL — ABNORMAL LOW (ref 32.0–36.0)
MCV: 69 fL — ABNORMAL LOW (ref 81–101)
MONO#: 0.4 10*3/uL (ref 0.1–0.9)
MONO%: 8 % (ref 0.0–13.0)
NEUT%: 65.6 % (ref 39.6–80.0)
NEUTROS ABS: 3.3 10*3/uL (ref 1.5–6.5)
PLATELETS: 293 10*3/uL (ref 145–400)
RBC: 4.95 10*6/uL (ref 3.70–5.32)
RDW: 17.3 % — AB (ref 11.1–15.7)
WBC: 5 10*3/uL (ref 3.9–10.0)

## 2013-12-23 LAB — COMPREHENSIVE METABOLIC PANEL
ALBUMIN: 4 g/dL (ref 3.5–5.2)
ALT: 17 U/L (ref 0–35)
AST: 21 U/L (ref 0–37)
Alkaline Phosphatase: 66 U/L (ref 39–117)
BUN: 12 mg/dL (ref 6–23)
CHLORIDE: 105 meq/L (ref 96–112)
CO2: 27 mEq/L (ref 19–32)
Calcium: 9.1 mg/dL (ref 8.4–10.5)
Creatinine, Ser: 0.79 mg/dL (ref 0.50–1.10)
Glucose, Bld: 98 mg/dL (ref 70–99)
POTASSIUM: 4.4 meq/L (ref 3.5–5.3)
Sodium: 138 mEq/L (ref 135–145)
Total Bilirubin: 0.4 mg/dL (ref 0.3–1.2)
Total Protein: 6.4 g/dL (ref 6.0–8.3)

## 2013-12-23 MED ORDER — LORAZEPAM 0.5 MG PO TABS: 0.5000 mg | ORAL_TABLET | Freq: Two times a day (BID) | ORAL | Status: DC

## 2013-12-24 LAB — FERRITIN CHCC: Ferritin: 4 ng/ml — ABNORMAL LOW (ref 9–269)

## 2013-12-24 NOTE — Progress Notes (Signed)
This office note has been dictated.

## 2013-12-25 NOTE — Progress Notes (Signed)
DIAGNOSIS:  Polycythemia vera, JAK2-negative.  CURRENT THERAPY: 1. Phlebotomy to maintain hematocrit less than 38%. 2. Plavix 75 mg p.o. daily.  INTERIM HISTORY:  Ms. Breisch comes in for followup.  She is doing okay. It sounds like she is still having some issues with this recent "stroke."  I am not sure exactly what happened to her.  She does have some element of depression.  I can understand this.  There is a lot going on with her and her husband.  She has had some abdominal pain.  She has had no problems with bowels or bladder.  She has had no back issues.  She has had no issues with cough or shortness of breath.  She clearly is iron deficient.  When we last saw her in December, her ferritin was 4.  PHYSICAL EXAMINATION:  This is a fairly well-developed, well-nourished white female in no obvious distress.  Vital Signs:  Temperature 98.4, pulse 75, respiratory rate 14, blood pressure 146/71, and weight is 203 pounds.  Head and Neck:  No ocular or oral lesions.  There are no palpable cervical or supraclavicular lymph nodes.  Lungs:  Clear bilaterally.  Cardiac:  Regular rate and rhythm with a normal S1 and S2. There are no murmurs, rubs, or bruits.  Abdomen:  Soft.  She has good bowel sounds.  There is no fluid wave.  There is no palpable abdominal mass.  There is no palpable hepatosplenomegaly.  Extremities:  No clubbing, cyanosis, or edema.  Neurological:  Some slight unsteadiness of gait.  No nystagmus is noted.  LABORATORY STUDIES:  White cell count is 5, hemoglobin 10, hematocrit 34, platelet count 293.  Ferritin is 4.  IMPRESSION:  Ms. Sawa is a nice 63 year old white female.  We are watching her for polycythemia.  So far, she has done well from my point of view.  Again, I just have a hard time figuring out if she really did have another "brainstem stroke."  She is on Plavix.  We have been maintaining her hematocrit below 38%.  As such, I would not think that she is  hyper viscous.  We will go ahead and plan to get her back in 2 months' time.  I do not see that we have to do any additional studies on her.    ______________________________ [name], M.D. PRE/MEDQ  D:  12/24/2013  T:  12/25/2013  Job:  [id number]

## 2014-01-04 ENCOUNTER — Ambulatory Visit (INDEPENDENT_AMBULATORY_CARE_PROVIDER_SITE_OTHER): Payer: Medicare Other | Admitting: Neurology

## 2014-01-04 ENCOUNTER — Encounter: Payer: Self-pay | Admitting: Neurology

## 2014-01-04 VITALS — BP 108/68 | HR 80 | Temp 98.2°F | Wt 205.4 lb

## 2014-01-04 DIAGNOSIS — I679 Cerebrovascular disease, unspecified: Secondary | ICD-10-CM

## 2014-01-04 DIAGNOSIS — F3289 Other specified depressive episodes: Secondary | ICD-10-CM

## 2014-01-04 DIAGNOSIS — R4189 Other symptoms and signs involving cognitive functions and awareness: Secondary | ICD-10-CM

## 2014-01-04 DIAGNOSIS — R1310 Dysphagia, unspecified: Secondary | ICD-10-CM

## 2014-01-04 DIAGNOSIS — D45 Polycythemia vera: Secondary | ICD-10-CM

## 2014-01-04 DIAGNOSIS — H8110 Benign paroxysmal vertigo, unspecified ear: Secondary | ICD-10-CM

## 2014-01-04 DIAGNOSIS — F09 Unspecified mental disorder due to known physiological condition: Secondary | ICD-10-CM

## 2014-01-04 DIAGNOSIS — F329 Major depressive disorder, single episode, unspecified: Secondary | ICD-10-CM

## 2014-01-04 DIAGNOSIS — F32A Depression, unspecified: Secondary | ICD-10-CM

## 2014-01-04 NOTE — Patient Instructions (Signed)
1.  Continue Plavix and cholesterol control 2.  Consider talking with a counselor regarding depression 3.  Follow up in 6 months

## 2014-01-04 NOTE — Progress Notes (Signed)
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  Records and images were personally reviewed where available.    Symptoms really started back in 1999.  She had a presumed stroke.  She had a routine gynecological procedure.  During the procedure, she had a spike and drop in blood pressure.  Afterwards, she exhibited dizziness, falls, facial droop and speech difficulties.  Work up, including MRI, was unremarkable.  She has several issues: SYNCOPAL SPELLS & BENIGN PAROXYSMAL POSITIONAL VERTIGO:  She has had multiple syncopal episodes, where she has dizziness followed by fall and loss of consciousness.  She is usually unconscious for less than a minute.  She does not exhibit and convulsions, tongue biting or urinary or bowel incontinence.  She does carry a diagnosis of orthostatic hypotension, and takes a beta blocker for tachycardia.  Review of vitals over the past several years range from 22-025 systolic and 42-706 diastolic with pulse 23-76 bpm.  She has episodes of spinning as well, and nystagmus has been observed.  There is no reported tinnitus.  She tends to fall to the left.  She was recommended to wear compression stockings.  She uses meclizine.  She has seen Dr. Erik Obey for vertigo.  She wears compression stockings.  GAIT INSTABILITY: She feels like she drags her left foot more and has had increased falls.  MEMORY PROBLEMS:  She also has problems with memory.  In October 2012, her MMSE was 20, clock drawing was 4, and animal fluency was 12.  In February 2014, her MMSE was 21, clock drawing was 4, and animal fluency was 12.  She does have anxiety as well as depression, related to the passing of her son.  She needs assistance  from her husband to perform ADLs.  She cannot cook for herself or administer her medication.  Sometimes, her husband has to open the door for her to the bathroom.  She apparently had a formal neuropsychological exam, but I don't have the results of that evaluation.  CHRONIC BACK/LEG PAIN, RESTLESS LEG, MIGRAINE:  She has a history of fibromyalgia as well as L5/S1 radiculopathy, causing back and leg pain, restless leg and contributing to falls.  She has migraines, with aura of flashing lights, left sided headache, nausea and vomiting.  She had status migrainosis after the stroke in 1999, in which she received DHE.  Migraine attacks are currently controlled with biofeedback.  She is followed by Dr. Illene Labrador for pain management.  She has had epidural injections in the past.  She takes flexeril, Lidoderm patch, morphine.  POLYCYTHEMIA VERA & CEREBROVASCULAR DISEASE:  It was discovered that she had polycythemia vera, which could have been contributing to TIAs.  There was a question regarding if her symptoms have been contributed by a brainstem stroke.  Over the past year, she reportedly has had worsening of symptoms, in regards to dragging her right leg and speech.  Dr. Erling Cruz repeated MRI last fall, which did not reveal any acute change.  Her polycythemia vera, she is followed by Dr. Marin Olp.  She requires phlebotomy if her HCT is greater than 38.  Since treatment, she really hasn't noticed any stability of symptoms.  Labs from 12/23/13 reveal WBC 5, HGB 10, HCT 34, PLT 293 and ferritin 4.  She is on Plavix.  11/12/13 labs:  LDL 82, TSH 0.934, vit D 25-hydroxy 50.  DYSPHAGIA:  She has a history of choking sensation with solid foods, in which she now eats a soft diet.  At one point, her previous treating physician, Dr. Morene Antu, entertained the idea that it may be related to tardive dyskinesia.  She had a modified barium swallow on 08/01/11.  It revealed normal oropharyngeal swallow function with an esophageal  dysphagia, with reduced peristalsis and slow esophageal emptying, especially with puree and cracker boluses.  There was no aspiration or penetration observed.  She had frequent dry cough.  Recent endoscopy revealed esophogeal strictures.  Recent modified barium swallow from 12/17/13 revealed mild pharyngeal dysphagia, primarily motor based.  Penetrated thin liquids only when taking large boluses.  Significant statis beginning at C7/T1.  Recommended Dysphagia 3 (mechanical soft) diet.  It was suggested that she may have had a small brainstem stroke over the past year, causing worsening of dysphagia.  EXPRESSIVE APHASIA & WORD-FINDING DIFFICULTIES:  She also has speech difficulty described as word-finding difficulties and effortful poorly articulated speech.  OTHER ISSUES: She has depression and has lost two of her children.  Her son died in [place].  Her daughter recently passed away before the holidays from leukemia.  She also has pulmonary disease, for which she sees Dr. Annamaria Boots.  Her O2 saturations have been as low as 79%.  She uses 2% nasal O2.  She has history of recurrent pneumonia, possibly secondary to aspiration.    Of note, she also has remote history of thoracic outlet syndrome on the right, for which she had surgery in the 80s.  LABS & IMAGES: Recent LDL 82  MRI Brain w/contrast 10/17/12: 1.  Several small, non-specific foci of gliosis in the bifrontal and biparietal subcortical white matter.  2.  No abnormal enhancing lesions.  MRI cervical spine with and without contrast (10/17/12): 1. Disc bulging from C4-5 to C6-7.  No spinal stenosis or foraminal narrowing.  2.  No intrinsic or enhancing spinal cord lesions.  MEDICATIONS include: Plavix, Wellbutrin XL, Flexeril, Lidoderm patch, morphine, Centrum Silver, omega-3, calcium, magnesium, riboflavin, flaxseed oil, coenzyme Q10, Proventil, Astelin, Toprol-XL, Pravachol, Protonix.  PAST MEDICAL HISTORY: Past Medical History  Diagnosis Date  .  GERD (gastroesophageal reflux disease)   . Hyperlipidemia   . Depression   . Allergy   . Dizziness   . Vertigo   . Migraine headache   . Fluttering heart   . Hypertension   . Allergic rhinitis   . Polycythemia rubra vera   . Complication of anesthesia     very sensitive to sedatives, B/P drops  . CVA (cerebral vascular accident)     left side weakness residual, uses mobile chair, cane  . Asthma     uses oxygen 2.5 l/m 24/7, sleeps elevated  . Thoracic outlet syndrome     '81-repair right side  . Fibromyalgia     MEDICATIONS: Current Outpatient Prescriptions on File Prior to Visit  Medication Sig Dispense Refill  . albuterol (PROVENTIL HFA;VENTOLIN HFA) 108 (90 BASE) MCG/ACT inhaler Inhale 2 puffs into the lungs every 4 (four) hours as needed for wheezing or shortness of breath.      Marland Kitchen albuterol (PROVENTIL) (2.5 MG/3ML) 0.083% nebulizer solution Take 2.5 mg by nebulization every 6 (six) hours as needed for wheezing or shortness of breath.      Marland Kitchen azelastine (ASTELIN) 137 MCG/SPRAY nasal spray Place 1 spray into the nose 2 (two)  times daily. Use in each nostril as directed      . beclomethasone (BECONASE-AQ) 42 MCG/SPRAY nasal spray Place 2 sprays into the nose 2 (two) times daily. Dose is for each nostril.      . benzonatate (TESSALON) 100 MG capsule Take 100 mg by mouth every 6 (six) hours as needed for cough.      Marland Kitchen buPROPion (WELLBUTRIN XL) 300 MG 24 hr tablet Take 300 mg by mouth daily with breakfast.       . Calcium Carbonate-Vit D-Min (CALCIUM 1200 PO) Take 2,400 mg by mouth daily.       . clopidogrel (PLAVIX) 75 MG tablet Take 75 mg by mouth daily.      Marland Kitchen co-enzyme Q-10 30 MG capsule Take 30 mg by mouth daily.       . cyclobenzaprine (FLEXERIL) 10 MG tablet Take 10 mg by mouth 3 (three) times daily as needed for muscle spasms.       Adora Fridge VAGINAL 0.1 MG/GM vaginal cream Place 1 Applicatorful vaginally daily.       . fexofenadine (ALLEGRA) 180 MG tablet Take 180 mg by  mouth daily as needed for allergies. As needed      . Flaxseed, Linseed, (FLAXSEED OIL) 1200 MG CAPS Take 1 capsule by mouth 1 day or 1 dose.       . Grape Seed Extract 30 MG CAPS Take 1 capsule by mouth every morning.       . lidocaine (LIDODERM) 5 % Place 1 patch onto the skin as needed (pain). Remove & Discard patch within 12 hours or as directed by MD      . LORazepam (ATIVAN) 0.5 MG tablet Take 1 tablet (0.5 mg total) by mouth 2 (two) times daily. Take 2 tabs in morning and 2 tablets at bedtime  60 tablet  2  . magnesium gluconate (MAGONATE) 500 MG tablet Take 500 mg by mouth daily as needed (constipation).       . meclizine (ANTIVERT) 25 MG tablet Take 25 mg by mouth 3 (three) times daily as needed for dizziness.       . metoprolol (TOPROL-XL) 50 MG 24 hr tablet Take 50 mg by mouth daily with breakfast.       . Misc Natural Products (TART CHERRY ADVANCED PO) Take 5 mLs by mouth daily. Juice Concentrate- 1tsp daily      . morphine (MS CONTIN) 30 MG 12 hr tablet Take 15 mg by mouth 2 (two) times daily.       Marland Kitchen morphine (MSIR) 15 MG tablet Take 15 mg by mouth every 4 (four) hours as needed for moderate pain or severe pain. As needed for migraines      . Omega-3 Fatty Acids (FISH OIL) 1200 MG CAPS Take 2,400 mg by mouth 2 (two) times daily.      . pantoprazole (PROTONIX) 40 MG tablet Take 1 tablet (40 mg total) by mouth daily.  90 tablet  1  . pravastatin (PRAVACHOL) 40 MG tablet Take 40 mg by mouth at bedtime.       . promethazine-codeine (PHENERGAN WITH CODEINE) 6.25-10 MG/5ML syrup Take 5 mLs by mouth every 4 (four) hours as needed for cough.  200 mL  0  . Respiratory Therapy Supplies (NEBULIZER) DEVI Use as directed  1 each  0  . riboflavin (VITAMIN B-2) 100 MG TABS Take 200 mg by mouth 2 (two) times daily before a meal.      . Turmeric Curcumin 500 MG  CAPS Take 1 capsule by mouth daily.      Marland Kitchen ZETIA 10 MG tablet TAKE 1 TABLET BY MOUTH DAILY.  30 tablet  0  . ZOVIRAX 5 % Apply 1 application  topically as needed (flair).        No current facility-administered medications on file prior to visit.    ALLERGIES: Allergies  Allergen Reactions  . Qvar [Beclomethasone]   . Spiriva [Tiotropium Bromide Monohydrate]     rash  . Chlorzoxazone   . Epinephrine     Raises heart rate  . Lamotrigine   . Levofloxacin   . Neurontin [Gabapentin]     arthralgia  . Protriptyline Hcl     Severe constipation  . Ropinirole Hcl     arthralgia  . Tizanidine     panic  . Verapamil   . Zonegran     Mood swings  . Advair Diskus [Fluticasone-Salmeterol] Rash  . Baclofen Rash  . Chlorzoxazone Rash  . Levofloxacin Rash    FAMILY HISTORY: Family History  Problem Relation Age of Onset  . Cervical cancer Mother   . Heart disease Mother   . Hypertension Mother   . Stroke Other     aunt  . Hypertension Other     aunt  . Stroke Other     uncle  . Heart attack Other     uncle  . Emphysema Other     aunt  . Emphysema Father   . Allergies      aunt and sister  . Cancer Mother   . Clotting disorder Mother     and aunts x 2  . Rheum arthritis      grandmother    SOCIAL HISTORY: History   Social History  . Marital Status: Married    Spouse Name: N/A    Number of Children: N  . Years of Education: N/A   Occupational History  . RETIRED     occupational hand therapist   Social History Main Topics  . Smoking status: Never Smoker   . Smokeless tobacco: Never Used  . Alcohol Use: No  . Drug Use: No  . Sexual Activity: Not on file   Other Topics Concern  . Not on file   Social History Narrative  . No narrative on file    REVIEW OF SYSTEMS: Constitutional: No fevers, chills, or sweats, no generalized fatigue, change in appetite Eyes: No visual changes, double vision, eye pain Ear, nose and throat: trouble swallowing, dizziness Cardiovascular: No chest pain, palpitations Respiratory:  Shortness of breath GastrointestinaI: No nausea, vomiting, diarrhea, abdominal  pain, fecal incontinence Genitourinary:  No dysuria, urinary retention or frequency Musculoskeletal:  Back pain Integumentary: No rash, pruritus, skin lesions Neurological: as above Psychiatric: depression  Endocrine: No palpitations, fatigue, diaphoresis, mood swings, change in appetite, change in weight, increased thirst Hematologic/Lymphatic:  No anemia, purpura, petechiae. Allergic/Immunologic: no itchy/runny eyes, nasal congestion, recent allergic reactions, rashes  PHYSICAL EXAM: Filed Vitals:   01/04/14 1302  BP: 108/68  Pulse: 80  Temp: 98.2 F (36.8 C)   General: No acute distress Head:  Normocephalic/atraumatic Neck: supple, no paraspinal tenderness, full range of motion Heart:  Regular rate and rhythm Lungs:  Clear to auscultation bilaterally Back: No paraspinal tenderness Neurological Exam: alert and oriented to person, place, and time. Able to name, read, write and follow 3 step commands across midline.  Speech fluent and slow with only occasional minor difficulty  Articulating.  Misspelled WORLD backwards as DLORW.  Recalled  0 of 3 words after a couple of minutes.  Difficulty copying intersecting pentagons.  MMSE 26/30.  Endorses reduced sensation on left side of face, otherwise CN II-XII intact. Bulk and tone normal.  Muscle strength 5/5 throughout.  Temperature and vibration mildly reduced in feet.  DTR 2+ throughout.  Toes down.  Finger to nose with no dysmetria.  Wide-based gait, cannot tandem, Romberg negative.  IMPRESSION: History of cerebrovascular disease and stroke Benign paroxysmal positional vertigo Esophogeal dysphagia  PLAN: 1.  Continue Plavix and statin 2.  Continue modified diet. 3.  Use walker 4.  Consider seeing a counselor or therapist in addition to antidepressant medications. 5.  Follow up in 6 months.  60 minutes spent with patient and husband, over 50% spent counseling and coordinating care.  Metta Clines, DO  CC:  Jenna Luo,  MD  Burney Gauze, MD

## 2014-01-19 ENCOUNTER — Other Ambulatory Visit: Payer: Self-pay | Admitting: Family Medicine

## 2014-01-20 ENCOUNTER — Telehealth: Payer: Self-pay | Admitting: Internal Medicine

## 2014-01-20 ENCOUNTER — Other Ambulatory Visit: Payer: Self-pay | Admitting: Nurse Practitioner

## 2014-01-20 ENCOUNTER — Telehealth: Payer: Self-pay | Admitting: Neurology

## 2014-01-20 DIAGNOSIS — F329 Major depressive disorder, single episode, unspecified: Secondary | ICD-10-CM

## 2014-01-20 DIAGNOSIS — F3289 Other specified depressive episodes: Secondary | ICD-10-CM

## 2014-01-20 MED ORDER — BUPROPION HCL ER (XL) 300 MG PO TB24: 300.0000 mg | ORAL_TABLET | Freq: Every day | ORAL | Status: DC

## 2014-01-20 MED ORDER — ALBUTEROL SULFATE HFA 108 (90 BASE) MCG/ACT IN AERS: 2.0000 | INHALATION_SPRAY | RESPIRATORY_TRACT | Status: DC | PRN

## 2014-01-20 NOTE — Telephone Encounter (Signed)
Rx for Wellbutrin faxed to [facility] @ [phone] for 90 day supply as requested by patient.

## 2014-01-20 NOTE — Telephone Encounter (Signed)
Rx placed in CDY's cart to be signed

## 2014-01-20 NOTE — Telephone Encounter (Signed)
Please let patient know that her Rx has been faxed as requested. Thanks.

## 2014-01-20 NOTE — Telephone Encounter (Signed)
Called spoke w/ pt. Aware rx's faxed. Nothing further needed

## 2014-01-20 NOTE — Telephone Encounter (Signed)
Please call patient she has something she wants to explain to you

## 2014-01-21 ENCOUNTER — Ambulatory Visit (INDEPENDENT_AMBULATORY_CARE_PROVIDER_SITE_OTHER): Payer: Medicare Other | Admitting: Internal Medicine

## 2014-01-21 ENCOUNTER — Encounter: Payer: Self-pay | Admitting: Internal Medicine

## 2014-01-21 VITALS — BP 124/68 | HR 88 | Ht 67.0 in | Wt 208.0 lb

## 2014-01-21 DIAGNOSIS — J45998 Other asthma: Secondary | ICD-10-CM

## 2014-01-21 DIAGNOSIS — J45909 Unspecified asthma, uncomplicated: Secondary | ICD-10-CM

## 2014-01-21 DIAGNOSIS — K219 Gastro-esophageal reflux disease without esophagitis: Secondary | ICD-10-CM

## 2014-01-21 MED ORDER — PROMETHAZINE-CODEINE 6.25-10 MG/5ML PO SYRP: 5.0000 mL | ORAL_SOLUTION | ORAL | Status: DC | PRN

## 2014-01-21 MED ORDER — DOXYCYCLINE HYCLATE 100 MG PO TABS: ORAL_TABLET | ORAL | Status: DC

## 2014-01-21 NOTE — Telephone Encounter (Signed)
Patient was not at home left number for office to call back

## 2014-01-21 NOTE — Progress Notes (Signed)
Subjective:   07/22/11- 37 yoF here on kind referral by Dr. Ernst Spell [place] because of asthma and history of pneumonia with cough. Husband here. She was a patient of mine in the [facility] at the old practice. She gives a history of asthma and bronchitis. Upper respiratory problems have been more evident since November of 2011. Coughs easily while eating. Coughs each morning bringing up small pellets of green. Wakes nauseated in the morning until she coughs her chest clear. Nebulizer treatments at her doctors for this has seemed very helpful and she would like to have a machine at home. Wears a mask outside and finds she is much bothered by strong odors heat and humidity. She is using Qvar 40 twice daily but blames that for burning in her mouth despite aggressive anti-thrush management. Using Beconase AQ nasal spray, a rescue inhaler once or twice daily and Tessalon pearls.  She feels distinctly better indoors where her husband has installed an extensive air filter system as well as individual room air cleaners in several rooms. She has encasings on bending and they are careful about dust control. Allergy skin testing in the past was positive without allergy vaccine. She has a history of polycythemia vera followed by Dr. Marin Olp with phlebotomy. Polycythemia is planned for cerebrovascular accident which has left her with cognitive and speech difficulties. She also has history of irregular heartbeat on Toprol. Pneumonia in 1978. No history of deep vein thrombosis or pulmonary embolism. Pneumococcal vaccine twice. Flu vaccine last year with blamed recurrent episodes of bronchitis during the winter. Thoracic steal syndrome was addressed with vascular reconstruction on the right in the 1980s. Chest x-ray report from 06/12/2011 described minimal right basilar atelectasis with no acute infiltrate identified, improved from earlier film. Never smoked. Family history a sister with severe allergy problems, and  with COPD, father worked with asbestos exposure and was a heavy smoker   08/23/11- 52 yoF never smoker, asthma and history of pneumonia with cough, complicated by past history surgery for thoracic outlet syndrome, CVA, allergic rhinitis, polycythemia. After last visit August 20, I have some concern that what she was describing was related to upper airway control after her stroke. She comes today for followup of lab work. ONOX-oxygen desaturation equal or less than 89% on room air for 15.4 minutes, less than or equal to 88% for 9.9 minutes, and lowest recorded saturation 82% on 07/29/2011. This could justify home oxygen for sleep. PFT-08/01/2011-normal baseline spirometry with some small airway response to bronchodilator, normal lung volumes, diffusion moderately reduced. FEV1/FVC 0.85, TLC 96%, DLCO 62%. Alpha I antitrypsin assessment: Normal MM. Speech therapy modified barium swallow was performed 08/01/2011 but I do not yet have the therapist report. I have counseled her care with swallowing, careful chewing and swallowing, sitting upright etc.  12/27/11-  20 yoF never smoker, asthma and history of pneumonia with cough, complicated by past history surgery for thoracic outlet syndrome, CVA, allergic rhinitis, polycythemia. Husband here  PCP Dr Jacelyn Grip After overnight oximetry she qualified for home oxygen during sleep at 2 L per minute/[facility]. They have an electrical generator and her husband is getting a backup generator. She chokes occasionally eating without obvious reflux. We discussed dysphagia and aspiration risk. Speech therapist report describes esophageal dysphagia without aspiration. A copy of this report will be forwarded to Dr. Jacelyn Grip who might consider speech therapy for swallowing training. Dr Marin Olp gave prednisone for polycythemia.  04/20/12- 39 yoF never smoker, asthma and history of pneumonia with cough, complicated by  past history surgery for thoracic outlet syndrome, CVA, allergic  rhinitis, polycythemia. Husband here    PCP Dr Jacelyn Grip  Husband is here  PT STATES INCREASE SOB,WHEEZING,DUE TO SEASON. HAS A DRY HACKY COUGH  She is blaming bad weather for her increased cough. They put a big air cleaner system in the home. She has been staying indoors to avoid pollen. Benzonatate helps her chronic cough. Nebulizer helps her wheezing.  Wears oxygen for sleep. Dr Marin Olp continues to treat her for polycythemia. We again discussed her history of CVA and questionable diagnosis of MS. Swallowing evaluation (August 2012/filed under imaging) describes esophageal dysphagia and recommended reflux precautions but did not see aspiration, reflux or penetration. She coughed throughout the procedure. ++ Consider allergy profile next visit++  10/20/12- 86 yoF never smoker, asthma and history of pneumonia with cough, complicated by past history surgery for thoracic outlet syndrome, CVA, allergic rhinitis, polycythemia. Husband here    PCP Dr Jacelyn Grip  Husband is here FOLLOWS FOR: doing well as long as sticking with "plan of care".  Had flu vaccine. Blames either Spiriva or Qvar for burning feeling on her tongue and problems with her teeth. Stopped both and doing better.  She is sensitive to weather change. Has morning cough with clear or tan of mucus. Evaluated for degenerative disc disease and syncopal episodes.  04/20/13- 23 yoF never smoker, asthma and history of pneumonia with cough, complicated by past history surgery for thoracic outlet syndrome, CVA, allergic rhinitis, polycythemia. Husband here    PCP Dr Jacelyn Grip   Allergy profile was NEG, 4.9 total IgE Bronchitis with productive cough after an afternoon outdoors. She stayed indoors the next day and is better now. Continues oxygen 2-3 L/[facility]. Medications reviewed.these  10/19/13- 62 yoF never smoker, asthma and history of pneumonia with cough, complicated by past history surgery for thoracic outlet syndrome, CVA, GERD, allergic rhinitis,  polycythemia. Husband here    PCP Dr Berline Chough for- c/o wheezing, SOB with exertion, hoarseness, non prod cough. Little phlegm.  Nl PFT 2012 except DLCO 62% Dtr in [place] dying of leukemia. Pt is on O2 2-3L/ [facility].  ENT/ [facility] referred her to GI/ Dr Paulita Fujita: Ba swallow> stricture and probably mild aspiration  01/21/14-  62 yoF never smoker, asthma and history of pneumonia with cough, complicated by past history surgery for thoracic outlet syndrome, CVA, GERD, allergic rhinitis, polycythemia, depression. Husband here    PCP Dr Jacelyn Grip    ACUTE VISIT: increased cough; getting worse. Losing her voice. Woke up with laryngitis/bronchitis, coughing green. Tussive soreness upper trachea area. No definite fever. Had dysphagia on swallowing evaluation. GI/ Dr Paulita Fujita. Continues oxygen 2.5L/ [facility]  Review of Systems- See HPI Constitutional:   No-   weight loss, night sweats, fevers, chills, fatigue, lassitude. HEENT:   frequent  headaches,  Some difficulty swallowing,, sore throat,       No-  sneezing, itching, ear ache, +nasal congestion, post nasal drip,  CV:  No-   chest pain, orthopnea, PND, swelling in lower extremities, anasarca, dizziness, palpitations Resp: + shortness of breath with exertion or at rest. +productive cough              No-  coughing up of blood.              +   change in color of mucus.  No- wheezing.   Skin: No-   rash or lesions. GI:  No-   heartburn, indigestion, abdominal pain, nausea, vomiting, GU:  MS:  No-   joint pain or swelling.   Neuro- nothing new or unusual  Psych:  No- change in mood or affect. No depression or anxiety.  No memory loss.    Objective:   Physical Exam  General- Alert, Oriented, Affect-appropriate, Distress- none acute,  Overweight. O2 2.5 L/              Advanced Skin- rash-none, lesions- none, excoriation- none Lymphadenopathy- none Head- atraumatic            Eyes- Gross vision intact, PERRLA, conjunctivae clear secretions             Ears- Hearing, canals            Nose- Clear, No- Septal dev, mucus, polyps, erosion, perforation             Throat- Mallampati III , mucosa clear , drainage- none, tonsils- atrophic, +hoarse Neck- flexible , trachea midline, no stridor , thyroid nl, carotid no bruit Chest - symmetrical excursion , unlabored           Heart/CV- RRR , no murmur , no gallop  , no rub, nl s1 s2                           - JVD- none , edema- none, stasis changes- none, varices- none           Lung- clear to P&A, wheeze-none, +cough- light/ dry cough with deep breath , dullness-none,                   rub- none, unlabored           Chest wall- Vascular surgery scar Right Upper Anterior chest Abd- Br/ Gen/ Rectal- Not done, not indicated Extrem- cyanosis- none, clubbing, none, atrophy- none, strength- . +Rolling walker Neuro- grossly intact to observation

## 2014-01-21 NOTE — Patient Instructions (Addendum)
Script for doxycycline sent to [facility] for cough syrup

## 2014-01-28 ENCOUNTER — Telehealth: Payer: Self-pay | Admitting: Internal Medicine

## 2014-01-28 MED ORDER — AZITHROMYCIN 250 MG PO TABS: ORAL_TABLET | ORAL | Status: DC

## 2014-01-28 NOTE — Telephone Encounter (Signed)
Spoke with the pt  She states that she is worse since acute ov 01/21/14  She is having more SOB and her cough has become more prod with yellow sputum She denies any CP, fever, or wheezing  Just finished round of doxy last night  Offered ov but she declined, due to snow  Please advise thanks! Allergies  Allergen Reactions  . Qvar [Beclomethasone]   . Spiriva [Tiotropium Bromide Monohydrate]     rash  . Chlorzoxazone   . Epinephrine     Raises heart rate  . Lamotrigine   . Levofloxacin   . Neurontin [Gabapentin]     arthralgia  . Protriptyline Hcl     Severe constipation  . Ropinirole Hcl     arthralgia  . Tizanidine     panic  . Verapamil   . Zonegran     Mood swings  . Advair Diskus [Fluticasone-Salmeterol] Rash  . Baclofen Rash  . Chlorzoxazone Rash  . Levofloxacin Rash   Current Outpatient Prescriptions on File Prior to Visit  Medication Sig Dispense Refill  . albuterol (PROVENTIL HFA;VENTOLIN HFA) 108 (90 BASE) MCG/ACT inhaler Inhale 2 puffs into the lungs every 4 (four) hours as needed for wheezing or shortness of breath.  3 Inhaler  3  . albuterol (PROVENTIL) (2.5 MG/3ML) 0.083% nebulizer solution Take 2.5 mg by nebulization every 6 (six) hours as needed for wheezing or shortness of breath.      Marland Kitchen azelastine (ASTELIN) 137 MCG/SPRAY nasal spray Place 1 spray into the nose 2 (two) times daily. Use in each nostril as directed      . beclomethasone (BECONASE-AQ) 42 MCG/SPRAY nasal spray Place 2 sprays into the nose 2 (two) times daily. Dose is for each nostril.      . benzonatate (TESSALON) 100 MG capsule Take 100 mg by mouth every 6 (six) hours as needed for cough.      Marland Kitchen buPROPion (WELLBUTRIN XL) 300 MG 24 hr tablet Take 1 tablet (300 mg total) by mouth daily with breakfast.  90 tablet  3  . Calcium Carbonate-Vit D-Min (CALCIUM 1200 PO) Take 2,400 mg by mouth daily.       . clopidogrel (PLAVIX) 75 MG tablet Take 75 mg by mouth daily.      Marland Kitchen co-enzyme Q-10 30 MG capsule  Take 30 mg by mouth daily.       . cyclobenzaprine (FLEXERIL) 10 MG tablet Take 10 mg by mouth 3 (three) times daily as needed for muscle spasms.       Marland Kitchen doxycycline (VIBRA-TABS) 100 MG tablet 2 today then one daily  8 tablet  0  . ESTRACE VAGINAL 0.1 MG/GM vaginal cream Place 1 Applicatorful vaginally daily.       . fexofenadine (ALLEGRA) 180 MG tablet Take 180 mg by mouth daily as needed for allergies. As needed      . Flaxseed, Linseed, (FLAXSEED OIL) 1200 MG CAPS Take 1 capsule by mouth 1 day or 1 dose.       . Grape Seed Extract 30 MG CAPS Take 1 capsule by mouth every morning.       . lidocaine (LIDODERM) 5 % Place 1 patch onto the skin as needed (pain). Remove & Discard patch within 12 hours or as directed by MD      . LORazepam (ATIVAN) 0.5 MG tablet Take 1 tablet (0.5 mg total) by mouth 2 (two) times daily. Take 2 tabs in morning and 2 tablets at bedtime  60 tablet  2  .  magnesium gluconate (MAGONATE) 500 MG tablet Take 500 mg by mouth daily as needed (constipation).       . meclizine (ANTIVERT) 25 MG tablet Take 25 mg by mouth 3 (three) times daily as needed for dizziness.       . metoprolol (TOPROL-XL) 50 MG 24 hr tablet Take 50 mg by mouth daily with breakfast.       . Misc Natural Products (TART CHERRY ADVANCED PO) Take 5 mLs by mouth daily. Juice Concentrate- 1tsp daily      . morphine (MS CONTIN) 30 MG 12 hr tablet Take 15 mg by mouth 2 (two) times daily.       Marland Kitchen morphine (MSIR) 15 MG tablet Take 15 mg by mouth every 4 (four) hours as needed for moderate pain or severe pain. As needed for migraines      . Omega-3 Fatty Acids (FISH OIL) 1200 MG CAPS Take 2,400 mg by mouth 2 (two) times daily.      . pantoprazole (PROTONIX) 40 MG tablet TAKE 1 TABLET (40 MG TOTAL) BY MOUTH DAILY.  30 tablet  11  . pravastatin (PRAVACHOL) 40 MG tablet Take 40 mg by mouth at bedtime.       . promethazine-codeine (PHENERGAN WITH CODEINE) 6.25-10 MG/5ML syrup Take 5 mLs by mouth every 4 (four) hours as  needed for cough.  200 mL  0  . Respiratory Therapy Supplies (NEBULIZER) DEVI Use as directed  1 each  0  . riboflavin (VITAMIN B-2) 100 MG TABS Take 200 mg by mouth 2 (two) times daily before a meal.      . Turmeric Curcumin 500 MG CAPS Take 1 capsule by mouth daily.      Marland Kitchen ZETIA 10 MG tablet TAKE 1 TABLET BY MOUTH DAILY.  30 tablet  0  . ZOVIRAX 5 % Apply 1 application topically as needed (flair).        No current facility-administered medications on file prior to visit.

## 2014-01-28 NOTE — Telephone Encounter (Signed)
Spouse aware of recs and rx called in. Nothing further needed

## 2014-01-28 NOTE — Telephone Encounter (Signed)
Per CY-offer Zpak #1 take as directed no refills. Thanks.

## 2014-02-07 ENCOUNTER — Other Ambulatory Visit: Payer: Self-pay | Admitting: Nurse Practitioner

## 2014-02-11 ENCOUNTER — Other Ambulatory Visit: Payer: Self-pay | Admitting: *Deleted

## 2014-02-11 DIAGNOSIS — G3183 Dementia with Lewy bodies: Principal | ICD-10-CM

## 2014-02-11 DIAGNOSIS — F0280 Dementia in other diseases classified elsewhere without behavioral disturbance: Secondary | ICD-10-CM

## 2014-02-11 NOTE — Telephone Encounter (Signed)
I spoke with patient spouse to inform them a updated  neuropsy eval and OT eval is needed for completion of insurance forms . This ref was made 02/11/14

## 2014-02-13 NOTE — Assessment & Plan Note (Signed)
Emphasis on reflux precautions. 2 followup with GI

## 2014-02-13 NOTE — Assessment & Plan Note (Signed)
Acute exacerbation. This could easily have been either viral bronchitis or reflux with aspiration Plan-doxycycline, cough syrup, reflux precautions

## 2014-02-18 ENCOUNTER — Telehealth: Payer: Self-pay | Admitting: Hematology & Oncology

## 2014-02-18 ENCOUNTER — Ambulatory Visit: Payer: Medicare Other | Admitting: Hematology & Oncology

## 2014-02-18 ENCOUNTER — Other Ambulatory Visit: Payer: Medicare Other | Admitting: Lab

## 2014-02-18 NOTE — Telephone Encounter (Signed)
Pt moved 3-20 to 3-27 she is sick

## 2014-02-25 ENCOUNTER — Ambulatory Visit: Payer: Medicare Other | Admitting: Hematology & Oncology

## 2014-02-25 ENCOUNTER — Other Ambulatory Visit: Payer: Medicare Other | Admitting: Lab

## 2014-02-28 ENCOUNTER — Other Ambulatory Visit: Payer: Medicare Other | Admitting: Lab

## 2014-02-28 ENCOUNTER — Ambulatory Visit: Payer: Medicare Other | Admitting: Hematology & Oncology

## 2014-02-28 ENCOUNTER — Telehealth: Payer: Self-pay | Admitting: Hematology & Oncology

## 2014-02-28 NOTE — Telephone Encounter (Signed)
Pt cx 3-30 moved to 4-29 they are still sick. I left RN message to triage

## 2014-03-01 ENCOUNTER — Telehealth: Payer: Self-pay | Admitting: *Deleted

## 2014-03-01 NOTE — Telephone Encounter (Signed)
I spoke with patient and patient's spouse they are both aware that Dr Valentina Shaggy does not accept [facility] . I offered to make them an appt with Dr Bartholomew Crews [place] but they declined . The patients spouse states they no longer need forms filled out . The patients spouse states he is not able to take patient to [facility]  He has been sick for 4 days and does not need to go on a scenic drive . This conversation lasted > than 15 minutes

## 2014-03-02 ENCOUNTER — Telehealth: Payer: Self-pay | Admitting: Hematology & Oncology

## 2014-03-02 NOTE — Telephone Encounter (Signed)
Pt left message to be seen before 4-29. I called back husband he said she needs to be seen sooner she isn't feeling well. Per RN I scheduled 4-2 lab and possible phlebotomy.

## 2014-03-04 ENCOUNTER — Other Ambulatory Visit (HOSPITAL_BASED_OUTPATIENT_CLINIC_OR_DEPARTMENT_OTHER): Payer: Medicare Other | Admitting: Lab

## 2014-03-04 DIAGNOSIS — D45 Polycythemia vera: Secondary | ICD-10-CM

## 2014-03-04 LAB — CMP (CANCER CENTER ONLY)
ALT: 21 U/L (ref 10–47)
AST: 27 U/L (ref 11–38)
Albumin: 3.2 g/dL — ABNORMAL LOW (ref 3.3–5.5)
Alkaline Phosphatase: 63 U/L (ref 26–84)
BILIRUBIN TOTAL: 0.5 mg/dL (ref 0.20–1.60)
BUN, Bld: 11 mg/dL (ref 7–22)
CALCIUM: 8.5 mg/dL (ref 8.0–10.3)
CHLORIDE: 101 meq/L (ref 98–108)
CO2: 30 mEq/L (ref 18–33)
CREATININE: 0.8 mg/dL (ref 0.6–1.2)
Glucose, Bld: 94 mg/dL (ref 73–118)
Potassium: 4 mEq/L (ref 3.3–4.7)
Sodium: 139 mEq/L (ref 128–145)
Total Protein: 6.2 g/dL — ABNORMAL LOW (ref 6.4–8.1)

## 2014-03-04 LAB — CBC WITH DIFFERENTIAL (CANCER CENTER ONLY)
BASO#: 0 10*3/uL (ref 0.0–0.2)
BASO%: 0.7 % (ref 0.0–2.0)
EOS%: 2.3 % (ref 0.0–7.0)
Eosinophils Absolute: 0.1 10*3/uL (ref 0.0–0.5)
HCT: 34.6 % — ABNORMAL LOW (ref 34.8–46.6)
HGB: 10.2 g/dL — ABNORMAL LOW (ref 11.6–15.9)
LYMPH#: 1.7 10*3/uL (ref 0.9–3.3)
LYMPH%: 27 % (ref 14.0–48.0)
MCH: 20.9 pg — ABNORMAL LOW (ref 26.0–34.0)
MCHC: 29.5 g/dL — ABNORMAL LOW (ref 32.0–36.0)
MCV: 71 fL — AB (ref 81–101)
MONO#: 0.5 10*3/uL (ref 0.1–0.9)
MONO%: 8.5 % (ref 0.0–13.0)
NEUT#: 3.8 10*3/uL (ref 1.5–6.5)
NEUT%: 61.5 % (ref 39.6–80.0)
Platelets: 258 10*3/uL (ref 145–400)
RBC: 4.87 10*6/uL (ref 3.70–5.32)
RDW: 18.5 % — AB (ref 11.1–15.7)
WBC: 6.1 10*3/uL (ref 3.9–10.0)

## 2014-03-04 LAB — FERRITIN CHCC

## 2014-03-07 ENCOUNTER — Ambulatory Visit: Payer: Medicare Other

## 2014-03-08 ENCOUNTER — Ambulatory Visit (INDEPENDENT_AMBULATORY_CARE_PROVIDER_SITE_OTHER): Payer: Medicare Other | Admitting: Family Medicine

## 2014-03-08 ENCOUNTER — Encounter: Payer: Self-pay | Admitting: Family Medicine

## 2014-03-08 VITALS — BP 100/58 | HR 68 | Temp 98.8°F | Resp 20 | Ht 67.0 in | Wt 208.0 lb

## 2014-03-08 DIAGNOSIS — I639 Cerebral infarction, unspecified: Secondary | ICD-10-CM

## 2014-03-08 DIAGNOSIS — I6350 Cerebral infarction due to unspecified occlusion or stenosis of unspecified cerebral artery: Secondary | ICD-10-CM

## 2014-03-08 DIAGNOSIS — E785 Hyperlipidemia, unspecified: Secondary | ICD-10-CM

## 2014-03-08 LAB — LIPID PANEL
Cholesterol: 128 mg/dL (ref 0–200)
HDL: 47 mg/dL (ref >39)
LDL Cholesterol: 44 mg/dL (ref 0–99)
Total CHOL/HDL Ratio: 2.7 ratio
Triglycerides: 185 mg/dL — ABNORMAL HIGH (ref <150)
VLDL: 37 mg/dL (ref 0–40)

## 2014-03-08 NOTE — Progress Notes (Signed)
Subjective:    Patient ID: Natalie Hendrix, female    DOB: [birth year], 63 y.o.   MRN: [id number]  HPI Patient has a remote history of a brainstem CVA as well as polycythemia vera. She is currently seeing Dr. Marin Olp who is managing her polycythemia with periodic phlebotomy. She is currently on Plavix for secondary prevention of stroke. She previously saw Dr. Erling Cruz. She is currently seeing a neurologist with [place] and would like a second opinion.  She's currently taking pravastatin as well as zetia hyperlipidemia. Her goal LDL is less than 70 given her history of stroke. I reviewed her recent lab work including a CBC, CMP. It does show hypoalbuminemia suggesting protein calorie malnutrition.  Patient is not eating well due to dysphasia.  She has seen gastroenterology and has been told that this is something she is going to have to learn to manage and cope with.  Otherwise she is doing well she is overdue for a fasting lipid panel. Past Medical History  Diagnosis Date  . GERD (gastroesophageal reflux disease)   . Hyperlipidemia   . Depression   . Allergy   . Dizziness   . Vertigo   . Migraine headache   . Fluttering heart   . Hypertension   . Allergic rhinitis   . Polycythemia rubra vera   . Complication of anesthesia     very sensitive to sedatives, B/P drops  . CVA (cerebral vascular accident)     left side weakness residual, uses mobile chair, cane  . Asthma     uses oxygen 2.5 l/m 24/7, sleeps elevated  . Thoracic outlet syndrome     '81-repair right side  . Fibromyalgia    Current Outpatient Prescriptions on File Prior to Visit  Medication Sig Dispense Refill  . albuterol (PROVENTIL HFA;VENTOLIN HFA) 108 (90 BASE) MCG/ACT inhaler Inhale 2 puffs into the lungs every 4 (four) hours as needed for wheezing or shortness of breath.  3 Inhaler  3  . albuterol (PROVENTIL) (2.5 MG/3ML) 0.083% nebulizer solution Take 2.5 mg by nebulization every 6 (six) hours as needed for wheezing or  shortness of breath.      Marland Kitchen azelastine (ASTELIN) 137 MCG/SPRAY nasal spray Place 1 spray into the nose 2 (two) times daily. Use in each nostril as directed      . beclomethasone (BECONASE-AQ) 42 MCG/SPRAY nasal spray Place 2 sprays into the nose 2 (two) times daily. Dose is for each nostril.      . benzonatate (TESSALON) 100 MG capsule Take 100 mg by mouth every 6 (six) hours as needed for cough.      Marland Kitchen buPROPion (WELLBUTRIN XL) 300 MG 24 hr tablet Take 1 tablet (300 mg total) by mouth daily with breakfast.  90 tablet  3  . Calcium Carbonate-Vit D-Min (CALCIUM 1200 PO) Take 2,400 mg by mouth daily.       . clopidogrel (PLAVIX) 75 MG tablet Take 75 mg by mouth daily.      Marland Kitchen co-enzyme Q-10 30 MG capsule Take 30 mg by mouth daily.       . cyclobenzaprine (FLEXERIL) 10 MG tablet Take 10 mg by mouth 3 (three) times daily as needed for muscle spasms.       Adora Fridge VAGINAL 0.1 MG/GM vaginal cream Place 1 Applicatorful vaginally daily.       . fexofenadine (ALLEGRA) 180 MG tablet Take 180 mg by mouth daily as needed for allergies. As needed      . Flaxseed, Linseed, (  FLAXSEED OIL) 1200 MG CAPS Take 1 capsule by mouth 1 day or 1 dose.       . Grape Seed Extract 30 MG CAPS Take 1 capsule by mouth every morning.       . lidocaine (LIDODERM) 5 % Place 1 patch onto the skin as needed (pain). Remove & Discard patch within 12 hours or as directed by MD      . LORazepam (ATIVAN) 0.5 MG tablet Take 1 tablet (0.5 mg total) by mouth 2 (two) times daily. Take 2 tabs in morning and 2 tablets at bedtime  60 tablet  2  . magnesium gluconate (MAGONATE) 500 MG tablet Take 500 mg by mouth daily as needed (constipation).       . meclizine (ANTIVERT) 25 MG tablet Take 25 mg by mouth 3 (three) times daily as needed for dizziness.       . metoprolol (TOPROL-XL) 50 MG 24 hr tablet Take 50 mg by mouth daily with breakfast.       . Misc Natural Products (TART CHERRY ADVANCED PO) Take 5 mLs by mouth daily. Juice Concentrate- 1tsp  daily      . morphine (MS CONTIN) 30 MG 12 hr tablet Take 15 mg by mouth 2 (two) times daily.       . Omega-3 Fatty Acids (FISH OIL) 1200 MG CAPS Take 2,400 mg by mouth 2 (two) times daily.      . pantoprazole (PROTONIX) 40 MG tablet TAKE 1 TABLET (40 MG TOTAL) BY MOUTH DAILY.  30 tablet  11  . pravastatin (PRAVACHOL) 40 MG tablet Take 40 mg by mouth at bedtime.       . promethazine-codeine (PHENERGAN WITH CODEINE) 6.25-10 MG/5ML syrup Take 5 mLs by mouth every 4 (four) hours as needed for cough.  200 mL  0  . Respiratory Therapy Supplies (NEBULIZER) DEVI Use as directed  1 each  0  . riboflavin (VITAMIN B-2) 100 MG TABS Take 200 mg by mouth 2 (two) times daily before a meal.      . Turmeric Curcumin 500 MG CAPS Take 1 capsule by mouth daily.      Marland Kitchen ZETIA 10 MG tablet TAKE 1 TABLET BY MOUTH DAILY.  30 tablet  0  . ZOVIRAX 5 % Apply 1 application topically as needed (flair).        No current facility-administered medications on file prior to visit.   Past Surgical History  Procedure Laterality Date  . Appendectomy    . Resection rib partial    . Gallbladder surgery    . Breast surgery  1979  . Synonectomy    . Dilation and curettage of uterus    . Root resection and revascularization  1980    of Long thoracic artery  . Rhinoplasty    . Cyst removal hand Bilateral     left done 10-13-13(Dr. Gramig)  . Esophagogastroduodenoscopy (egd) with propofol N/A 10/27/2013    Procedure: ESOPHAGOGASTRODUODENOSCOPY (EGD) WITH PROPOFOL;  Surgeon: Arta Silence, MD;  Location: [facility];  Service: Endoscopy;  Laterality: N/A;  . Balloon dilation N/A 10/27/2013    Procedure: BALLOON DILATION;  Surgeon: Arta Silence, MD;  Location: [facility];  Service: Endoscopy;  Laterality: N/A;   Allergies  Allergen Reactions  . Qvar [Beclomethasone]   . Spiriva [Tiotropium Bromide Monohydrate]     rash  . Chlorzoxazone   . Epinephrine     Raises heart rate  . Lamotrigine   . Levofloxacin   .  Neurontin [  Gabapentin]     arthralgia  . Protriptyline Hcl     Severe constipation  . Ropinirole Hcl     arthralgia  . Tizanidine     panic  . Verapamil   . Zonegran     Mood swings  . Advair Diskus [Fluticasone-Salmeterol] Rash  . Baclofen Rash  . Chlorzoxazone Rash  . Levofloxacin Rash   History   Social History  . Marital Status: Married    Spouse Name: N/A    Number of Children: N  . Years of Education: N/A   Occupational History  . RETIRED     occupational hand therapist   Social History Main Topics  . Smoking status: Never Smoker   . Smokeless tobacco: Never Used  . Alcohol Use: No  . Drug Use: No  . Sexual Activity: Not on file   Other Topics Concern  . Not on file   Social History Narrative  . No narrative on file      Review of Systems  All other systems reviewed and are negative.       Objective:   Physical Exam  Vitals reviewed. Constitutional: She is oriented to person, place, and time.  Cardiovascular: Normal rate, regular rhythm and normal heart sounds.   No murmur heard. Pulmonary/Chest: Effort normal and breath sounds normal. No respiratory distress. She has no wheezes.  Abdominal: Soft. Bowel sounds are normal.  Musculoskeletal: She exhibits no edema.  Neurological: She is alert and oriented to person, place, and time. She has normal reflexes. She displays normal reflexes. No cranial nerve deficit. She exhibits abnormal muscle tone. Coordination abnormal.   patient is weak and confined to wheelchair. She has decreased muscle mass in her arms and legs. She also slurred her speech. She also has word finding aphasia.        Assessment & Plan:  1. HLD (hyperlipidemia) Check fasting lipid panel. Goal LDL is less than 70. - Lipid panel  2. CVA (cerebral infarction) Continue Plavix. I will consult neurology for a second opinion at Dr. Tressia Danas previous office. - Ambulatory referral to Neurology

## 2014-03-09 ENCOUNTER — Other Ambulatory Visit: Payer: Self-pay | Admitting: Family Medicine

## 2014-03-09 MED ORDER — PANTOPRAZOLE SODIUM 40 MG PO TBEC: DELAYED_RELEASE_TABLET | ORAL | Status: DC

## 2014-03-09 MED ORDER — EZETIMIBE 10 MG PO TABS: ORAL_TABLET | ORAL | Status: DC

## 2014-03-10 ENCOUNTER — Other Ambulatory Visit: Payer: Self-pay | Admitting: *Deleted

## 2014-03-10 MED ORDER — PRAVASTATIN SODIUM 40 MG PO TABS: 40.0000 mg | ORAL_TABLET | Freq: Every day | ORAL | Status: DC

## 2014-03-10 MED ORDER — PANTOPRAZOLE SODIUM 40 MG PO TBEC: DELAYED_RELEASE_TABLET | ORAL | Status: DC

## 2014-03-10 MED ORDER — METOPROLOL SUCCINATE ER 50 MG PO TB24: 50.0000 mg | ORAL_TABLET | Freq: Every day | ORAL | Status: DC

## 2014-03-10 NOTE — Telephone Encounter (Signed)
Refill appropriate and filled per protocol. 

## 2014-03-24 ENCOUNTER — Other Ambulatory Visit: Payer: Self-pay | Admitting: *Deleted

## 2014-03-24 MED ORDER — CLOPIDOGREL BISULFATE 75 MG PO TABS: 75.0000 mg | ORAL_TABLET | Freq: Every day | ORAL | Status: DC

## 2014-03-29 ENCOUNTER — Other Ambulatory Visit: Payer: Self-pay | Admitting: Nurse Practitioner

## 2014-03-29 DIAGNOSIS — D45 Polycythemia vera: Secondary | ICD-10-CM

## 2014-03-30 ENCOUNTER — Ambulatory Visit: Payer: Medicare Other | Admitting: Hematology & Oncology

## 2014-03-30 ENCOUNTER — Other Ambulatory Visit: Payer: Self-pay | Admitting: Nurse Practitioner

## 2014-03-30 ENCOUNTER — Other Ambulatory Visit: Payer: Medicare Other | Admitting: Lab

## 2014-04-19 ENCOUNTER — Ambulatory Visit (INDEPENDENT_AMBULATORY_CARE_PROVIDER_SITE_OTHER): Payer: Medicare Other | Admitting: Internal Medicine

## 2014-04-19 ENCOUNTER — Encounter: Payer: Self-pay | Admitting: Internal Medicine

## 2014-04-19 VITALS — BP 122/68 | HR 87 | Ht 67.0 in | Wt 205.6 lb

## 2014-04-19 DIAGNOSIS — J3089 Other allergic rhinitis: Secondary | ICD-10-CM

## 2014-04-19 DIAGNOSIS — J45909 Unspecified asthma, uncomplicated: Secondary | ICD-10-CM

## 2014-04-19 DIAGNOSIS — J309 Allergic rhinitis, unspecified: Secondary | ICD-10-CM

## 2014-04-19 DIAGNOSIS — J302 Other seasonal allergic rhinitis: Secondary | ICD-10-CM

## 2014-04-19 DIAGNOSIS — J45998 Other asthma: Secondary | ICD-10-CM

## 2014-04-19 NOTE — Progress Notes (Signed)
Subjective:   07/22/11- 37 yoF here on kind referral by Dr. Ernst [place] because of asthma and history of pneumonia with cough. Husband here. She was a patient of mine in the [place] at the old practice. She gives a history of asthma and bronchitis. Upper respiratory problems have been more evident since November of 2011. Coughs easily while eating. Coughs each morning bringing up small pellets of green. Wakes nauseated in the morning until she coughs her chest clear. Nebulizer treatments at her doctors for this has seemed very helpful and she would like to have a machine at home. Wears a mask outside and finds she is much bothered by strong odors heat and humidity. She is using Qvar 40 twice daily but blames that for burning in her mouth despite aggressive anti-thrush management. Using Beconase AQ nasal spray, a rescue inhaler once or twice daily and Tessalon pearls.  She feels distinctly better indoors where her husband has installed an extensive air filter system as well as individual room air cleaners in several rooms. She has encasings on bending and they are careful about dust control. Allergy skin testing in the past was positive without allergy vaccine. She has a history of polycythemia vera followed by Dr. Marin Olp with phlebotomy. Polycythemia is planned for cerebrovascular accident which has left her with cognitive and speech difficulties. She also has history of irregular heartbeat on Toprol. Pneumonia in 1978. No history of deep vein thrombosis or pulmonary embolism. Pneumococcal vaccine twice. Flu vaccine last year with blamed recurrent episodes of bronchitis during the winter. Thoracic steal syndrome was addressed with vascular reconstruction on the right in the 1980s. Chest x-ray report from 06/12/2011 described minimal right basilar atelectasis with no acute infiltrate identified, improved from earlier film. Never smoked. Family history a sister with severe allergy problems, and  with COPD, father worked with asbestos exposure and was a heavy smoker   08/23/11- 52 yoF never smoker, asthma and history of pneumonia with cough, complicated by past history surgery for thoracic outlet syndrome, CVA, allergic rhinitis, polycythemia. After last visit August 20, I have some concern that what she was describing was related to upper airway control after her stroke. She comes today for followup of lab work. ONOX-oxygen desaturation equal or less than 89% on room air for 15.4 minutes, less than or equal to 88% for 9.9 minutes, and lowest recorded saturation 82% on 07/29/2011. This could justify home oxygen for sleep. PFT-08/01/2011-normal baseline spirometry with some small airway response to bronchodilator, normal lung volumes, diffusion moderately reduced. FEV1/FVC 0.85, TLC 96%, DLCO 62%. Alpha I antitrypsin assessment: Normal MM. Speech therapy modified barium swallow was performed 08/01/2011 but I do not yet have the therapist report. I have counseled her care with swallowing, careful chewing and swallowing, sitting upright etc.  12/27/11-  20 yoF never smoker, asthma and history of pneumonia with cough, complicated by past history surgery for thoracic outlet syndrome, CVA, allergic rhinitis, polycythemia. Husband here  PCP Dr Jacelyn Grip After overnight oximetry she qualified for home oxygen during sleep at 2 L per minute/[facility]. They have an electrical generator and her husband is getting a backup generator. She chokes occasionally eating without obvious reflux. We discussed dysphagia and aspiration risk. Speech therapist report describes esophageal dysphagia without aspiration. A copy of this report will be forwarded to Dr. Jacelyn Grip who might consider speech therapy for swallowing training. Dr Marin Olp gave prednisone for polycythemia.  04/20/12- 39 yoF never smoker, asthma and history of pneumonia with cough, complicated by  past history surgery for thoracic outlet syndrome, CVA, allergic  rhinitis, polycythemia. Husband here    PCP Dr Jacelyn Grip  Husband is here  PT STATES INCREASE SOB,WHEEZING,DUE TO SEASON. HAS A DRY HACKY COUGH  She is blaming bad weather for her increased cough. They put a big air cleaner system in the home. She has been staying indoors to avoid pollen. Benzonatate helps her chronic cough. Nebulizer helps her wheezing.  Wears oxygen for sleep. Dr Marin Olp continues to treat her for polycythemia. We again discussed her history of CVA and questionable diagnosis of MS. Swallowing evaluation (August 2012/filed under imaging) describes esophageal dysphagia and recommended reflux precautions but did not see aspiration, reflux or penetration. She coughed throughout the procedure. ++ Consider allergy profile next visit++  10/20/12- 47 yoF never smoker, asthma and history of pneumonia with cough, complicated by past history surgery for thoracic outlet syndrome, CVA, allergic rhinitis, polycythemia. Husband here    PCP Dr Jacelyn Grip  Husband is here FOLLOWS FOR: doing well as long as sticking with "plan of care".  Had flu vaccine. Blames either Spiriva or Qvar for burning feeling on her tongue and problems with her teeth. Stopped both and doing better.  She is sensitive to weather change. Has morning cough with clear or tan of mucus. Evaluated for degenerative disc disease and syncopal episodes.  04/20/13- 57 yoF never smoker, asthma and history of pneumonia with cough, complicated by past history surgery for thoracic outlet syndrome, CVA, allergic rhinitis, polycythemia. Husband here    PCP Dr Jacelyn Grip   Allergy profile was NEG, 4.9 total IgE Bronchitis with productive cough after an afternoon outdoors. She stayed indoors the next day and is better now. Continues oxygen 2-3 L/[facility]. Medications reviewed.these  10/19/13- 62 yoF never smoker, asthma and history of pneumonia with cough, complicated by past history surgery for thoracic outlet syndrome, CVA, GERD, allergic rhinitis,  polycythemia. Husband here    PCP Dr Berline Chough for- c/o wheezing, SOB with exertion, hoarseness, non prod cough. Little phlegm.  Nl PFT 2012 except DLCO 62% Dtr in [place] dying of leukemia. Pt is on O2 2-3L/ [facility].  ENT/ [facility] referred her to GI/ Dr Paulita Fujita: Ba swallow> stricture and probably mild aspiration  01/21/14-  62 yoF never smoker, asthma and history of pneumonia with cough, complicated by past history surgery for thoracic outlet syndrome, CVA, GERD, allergic rhinitis, polycythemia, depression. Husband here    PCP Dr Jacelyn Grip    ACUTE VISIT: increased cough; getting worse. Losing her voice. Woke up with laryngitis/bronchitis, coughing green. Tussive soreness upper trachea area. No definite fever. Had dysphagia on swallowing evaluation. GI/ Dr Paulita Fujita. Continues oxygen 2.5L/ [facility] Swallowing eval- Dysphagia III   04/19/14-63 yoF never smoker, asthma and history of pneumonia with cough, complicated by past history surgery for thoracic outlet syndrome, CVA, GERD, allergic rhinitis, polycythemia, depression. Husband here    PCP Dr Jacelyn Grip  FOLLOWS FOR: Pt states she has done well with the change in the weather. The new O2 tank she has is working better for her. Using home ionizer air cleaner Seeing GI for GERD  Review of Systems- See HPI Constitutional:   No-   weight loss, night sweats, fevers, chills, fatigue, lassitude. HEENT:   frequent  headaches,  Some difficulty swallowing,, sore throat,       No-  sneezing, itching, ear ache, +nasal congestion, post nasal drip,  CV:  No-   chest pain, orthopnea, PND, swelling in lower extremities, anasarca, dizziness, palpitations Resp: + shortness of  breath with exertion or at rest. +productive cough              No-  coughing up of blood.              +   change in color of mucus.  No- wheezing.   Skin: No-   rash or lesions. GI:  No-   heartburn, indigestion, abdominal pain, nausea, vomiting, GU:  MS:  No-   joint pain or swelling.    Neuro- nothing new or unusual  Psych:  No- change in mood or affect. No depression or anxiety.  No memory loss.    Objective:   Physical Exam  General- Alert, Oriented, Affect-appropriate, Distress- none acute,  Overweight. O2 2.5 L/                               Advanced Skin- rash-none, lesions- none, excoriation- none Lymphadenopathy- none Head- atraumatic            Eyes- Gross vision intact, PERRLA, conjunctivae clear secretions            Ears- Hearing, canals            Nose- Clear, No- Septal dev, mucus, polyps, erosion, perforation             Throat- Mallampati III , mucosa clear , drainage- none, tonsils- atrophic, +hoarse Neck- flexible , trachea midline, no stridor , thyroid nl, carotid no bruit Chest - symmetrical excursion , unlabored           Heart/CV- RRR , no murmur , no gallop  , no rub, nl s1 s2                           - JVD- none , edema- none, stasis changes- none, varices- none           Lung- clear to P&A, wheeze-none, +cough- light/ dry cough with deep breath ,                           dullness-none, rub- none, unlabored           Chest wall- Vascular surgery scar Right Upper Anterior chest Abd- Br/ Gen/ Rectal- Not done, not indicated Extrem- cyanosis- none, clubbing, none, atrophy- none, strength- . +Rolling walker Neuro- grossly intact to observation

## 2014-04-19 NOTE — Patient Instructions (Signed)
Please call if needed.

## 2014-04-28 ENCOUNTER — Other Ambulatory Visit (HOSPITAL_BASED_OUTPATIENT_CLINIC_OR_DEPARTMENT_OTHER): Payer: Medicare Other | Admitting: Lab

## 2014-04-28 ENCOUNTER — Ambulatory Visit (HOSPITAL_BASED_OUTPATIENT_CLINIC_OR_DEPARTMENT_OTHER): Payer: Medicare Other | Admitting: Hematology & Oncology

## 2014-04-28 ENCOUNTER — Encounter: Payer: Self-pay | Admitting: Hematology & Oncology

## 2014-04-28 VITALS — BP 120/66 | HR 79 | Temp 98.5°F | Resp 16 | Ht 67.0 in | Wt 201.0 lb

## 2014-04-28 DIAGNOSIS — D45 Polycythemia vera: Secondary | ICD-10-CM

## 2014-04-28 LAB — COMPREHENSIVE METABOLIC PANEL
ALK PHOS: 60 U/L (ref 39–117)
ALT: 19 U/L (ref 0–35)
AST: 22 U/L (ref 0–37)
Albumin: 4.1 g/dL (ref 3.5–5.2)
BUN: 13 mg/dL (ref 6–23)
CO2: 25 mEq/L (ref 19–32)
Calcium: 8.9 mg/dL (ref 8.4–10.5)
Chloride: 106 mEq/L (ref 96–112)
Creatinine, Ser: 0.85 mg/dL (ref 0.50–1.10)
Glucose, Bld: 99 mg/dL (ref 70–99)
Potassium: 4.2 mEq/L (ref 3.5–5.3)
Sodium: 138 mEq/L (ref 135–145)
Total Bilirubin: 0.4 mg/dL (ref 0.2–1.2)
Total Protein: 6.4 g/dL (ref 6.0–8.3)

## 2014-04-28 LAB — CBC WITH DIFFERENTIAL (CANCER CENTER ONLY)
BASO#: 0 10*3/uL (ref 0.0–0.2)
BASO%: 0.4 % (ref 0.0–2.0)
EOS%: 1.3 % (ref 0.0–7.0)
Eosinophils Absolute: 0.1 10*3/uL (ref 0.0–0.5)
HEMATOCRIT: 36.3 % (ref 34.8–46.6)
HGB: 11.1 g/dL — ABNORMAL LOW (ref 11.6–15.9)
LYMPH#: 1.5 10*3/uL (ref 0.9–3.3)
LYMPH%: 28.1 % (ref 14.0–48.0)
MCH: 21.1 pg — ABNORMAL LOW (ref 26.0–34.0)
MCHC: 30.6 g/dL — ABNORMAL LOW (ref 32.0–36.0)
MCV: 69 fL — AB (ref 81–101)
MONO#: 0.5 10*3/uL (ref 0.1–0.9)
MONO%: 8.5 % (ref 0.0–13.0)
NEUT%: 61.7 % (ref 39.6–80.0)
NEUTROS ABS: 3.4 10*3/uL (ref 1.5–6.5)
Platelets: 292 10*3/uL (ref 145–400)
RBC: 5.25 10*6/uL (ref 3.70–5.32)
RDW: 18 % — ABNORMAL HIGH (ref 11.1–15.7)
WBC: 5.4 10*3/uL (ref 3.9–10.0)

## 2014-04-28 NOTE — Progress Notes (Signed)
Hematology and Oncology Follow Up Visit  Natalie Hendrix [id number] 1951-09-14 63 y.o. 04/28/2014   Principle Diagnosis:   Polycythemia vera- JAK2 negative  Current Therapy:    Phlebotomy to maintain hematocrit less than 38%  Plavix 75 mg by mouth daily     Interim History:  [name] is back for followup. She is doing okay. She's had no other problems as we last saw her. She sees a neurologist in a couple weeks.  She's had no problems with fatigue. She's had no abdominal issues. She's had no cough or shortness of breath. She's had no nausea vomiting. She's had no hepatic issues.  Medications: Current outpatient prescriptions:albuterol (PROVENTIL HFA;VENTOLIN HFA) 108 (90 BASE) MCG/ACT inhaler, Inhale 2 puffs into the lungs every 4 (four) hours as needed for wheezing or shortness of breath., Disp: 3 Inhaler, Rfl: 3;  albuterol (PROVENTIL) (2.5 MG/3ML) 0.083% nebulizer solution, Take 2.5 mg by nebulization every 6 (six) hours as needed for wheezing or shortness of breath., Disp: , Rfl:  Alpha Lipoic Acid 200 MG CAPS, Take by mouth., Disp: , Rfl: ;  azelastine (ASTELIN) 137 MCG/SPRAY nasal spray, Place 1 spray into the nose 2 (two) times daily. Use in each nostril as directed, Disp: , Rfl: ;  beclomethasone (BECONASE-AQ) 42 MCG/SPRAY nasal spray, Place 2 sprays into the nose 2 (two) times daily. Dose is for each nostril., Disp: , Rfl:  benzonatate (TESSALON) 100 MG capsule, Take 100 mg by mouth every 6 (six) hours as needed for cough., Disp: , Rfl: ;  buPROPion (WELLBUTRIN XL) 300 MG 24 hr tablet, Take 1 tablet (300 mg total) by mouth daily with breakfast., Disp: 90 tablet, Rfl: 3;  Calcium Carbonate-Vit D-Min (CALCIUM 1200 PO), Take 2,400 mg by mouth daily. , Disp: , Rfl: ;  clopidogrel (PLAVIX) 75 MG tablet, Take 1 tablet (75 mg total) by mouth daily., Disp: 30 tablet, Rfl: 2 co-enzyme Q-10 30 MG capsule, Take 30 mg by mouth daily. , Disp: , Rfl: ;  cyclobenzaprine (FLEXERIL) 10 MG tablet, Take  10 mg by mouth 3 (three) times daily as needed for muscle spasms. , Disp: , Rfl: ;  ESTRACE VAGINAL 0.1 MG/GM vaginal cream, Place 1 Applicatorful vaginally daily. , Disp: , Rfl: ;  ezetimibe (ZETIA) 10 MG tablet, TAKE 1 TABLET BY MOUTH DAILY., Disp: 90 tablet, Rfl: 3 fexofenadine (ALLEGRA) 180 MG tablet, Take 180 mg by mouth daily as needed for allergies. As needed, Disp: , Rfl: ;  Flaxseed, Linseed, (FLAXSEED OIL) 1200 MG CAPS, Take 1 capsule by mouth 1 day or 1 dose. , Disp: , Rfl: ;  Grape Seed Extract 30 MG CAPS, Take 1 capsule by mouth every morning. , Disp: , Rfl:  lidocaine (LIDODERM) 5 %, Place 1 patch onto the skin as needed (pain). Remove & Discard patch within 12 hours or as directed by MD, Disp: , Rfl: ;  LORazepam (ATIVAN) 0.5 MG tablet, Take 1 tablet (0.5 mg total) by mouth 2 (two) times daily. Take 2 tabs in morning and 2 tablets at bedtime, Disp: 60 tablet, Rfl: 2;  magnesium gluconate (MAGONATE) 500 MG tablet, Take 500 mg by mouth daily as needed (constipation). , Disp: , Rfl:  meclizine (ANTIVERT) 25 MG tablet, Take 25 mg by mouth 3 (three) times daily as needed for dizziness. , Disp: , Rfl: ;  metoprolol succinate (TOPROL-XL) 50 MG 24 hr tablet, Take 1 tablet (50 mg total) by mouth daily with breakfast., Disp: 90 tablet, Rfl: 3;  Misc Natural  Products (TART CHERRY ADVANCED PO), Take 5 mLs by mouth daily. Juice Concentrate- 1tsp daily, Disp: , Rfl:  morphine (MS CONTIN) 30 MG 12 hr tablet, Take 15 mg by mouth 2 (two) times daily. , Disp: , Rfl: ;  morphine (MSIR) 15 MG tablet, Take 15 mg by mouth every 4 (four) hours as needed for severe pain., Disp: , Rfl: ;  Multiple Vitamins-Minerals (CENTRUM SILVER) tablet, Take 1 tablet by mouth daily., Disp: , Rfl: ;  Omega-3 Fatty Acids (FISH OIL) 1200 MG CAPS, Take 2,400 mg by mouth 2 (two) times daily., Disp: , Rfl:  pantoprazole (PROTONIX) 40 MG tablet, TAKE 1 TABLET (40 MG TOTAL) BY MOUTH DAILY., Disp: 90 tablet, Rfl: 3;  Polyethyl Glycol-Propyl  Glycol (SYSTANE) 0.4-0.3 % SOLN, Apply to eye., Disp: , Rfl: ;  pravastatin (PRAVACHOL) 40 MG tablet, Take 1 tablet (40 mg total) by mouth at bedtime., Disp: 90 tablet, Rfl: 3 promethazine-codeine (PHENERGAN WITH CODEINE) 6.25-10 MG/5ML syrup, Take 5 mLs by mouth every 4 (four) hours as needed for cough., Disp: 200 mL, Rfl: 0;  Respiratory Therapy Supplies (NEBULIZER) DEVI, Use as directed, Disp: 1 each, Rfl: 0;  riboflavin (VITAMIN B-2) 100 MG TABS, Take 200 mg by mouth 2 (two) times daily before a meal., Disp: , Rfl: ;  Turmeric Curcumin 500 MG CAPS, Take 1 capsule by mouth daily., Disp: , Rfl:  ZOVIRAX 5 %, Apply 1 application topically as needed (flair). , Disp: , Rfl:   Allergies:  Allergies  Allergen Reactions  . Qvar [Beclomethasone]   . Spiriva [Tiotropium Bromide Monohydrate]     rash  . Chlorzoxazone   . Epinephrine     Raises heart rate  . Lamotrigine   . Levofloxacin   . Neurontin [Gabapentin]     arthralgia  . Protriptyline Hcl     Severe constipation  . Ropinirole Hcl     arthralgia  . Tizanidine     panic  . Verapamil   . Zonegran     Mood swings  . Advair Diskus [Fluticasone-Salmeterol] Rash  . Baclofen Rash  . Chlorzoxazone Rash  . Levofloxacin Rash    Past Medical History, Surgical history, Social history, and Family History were reviewed and updated.  Review of Systems: As above  Physical Exam:  height is 5\' 7"  (1.702 m) and weight is 201 lb (91.173 kg). Her oral temperature is 98.5 F (36.9 C). Her blood pressure is 120/66 and her pulse is 79. Her respiration is 16.   Well-developed and well nourished white female. Head exam shows no ocular or oral lesions. Is no cervical or supraclavicular lymph nodes. Lungs are clear. Cardiac exam regular in rhythm. Abdomen is soft. There is no palpable liver or spleen. Extremities shows no clubbing cyanosis or edema. She has decent strength. Neurological exam does show some balance issues. Lab Results  Component Value  Date   WBC 5.4 04/28/2014   HGB 11.1* 04/28/2014   HCT 36.3 04/28/2014   MCV 69* 04/28/2014   PLT 292 04/28/2014     Chemistry      Component Value Date/Time   NA 139 03/04/2014 1351   NA 138 12/23/2013 1340   K 4.0 03/04/2014 1351   K 4.4 12/23/2013 1340   CL 101 03/04/2014 1351   CL 105 12/23/2013 1340   CO2 30 03/04/2014 1351   CO2 27 12/23/2013 1340   BUN 11 03/04/2014 1351   BUN 12 12/23/2013 1340   CREATININE 0.8 03/04/2014 1351   CREATININE 0.79  12/23/2013 1340      Component Value Date/Time   CALCIUM 8.5 03/04/2014 1351   CALCIUM 9.1 12/23/2013 1340   ALKPHOS 63 03/04/2014 1351   ALKPHOS 66 12/23/2013 1340   AST 27 03/04/2014 1351   AST 21 12/23/2013 1340   ALT 21 03/04/2014 1351   ALT 17 12/23/2013 1340   BILITOT 0.50 03/04/2014 1351   BILITOT 0.4 12/23/2013 1340         Impression and Plan: Ms. Bowmer is 63 year old female with polycythemia. She, again, JAK2 negative. We do not have to phlebotomize her. She is iron deficient.  Will we will plan to see her back in 2 months. I think this is reasonable for followup. She may need to be phlebotomized we'll see her back.   [name], MD 5/28/20153:28 PM

## 2014-04-29 LAB — FERRITIN CHCC: Ferritin: 4 ng/ml — ABNORMAL LOW (ref 9–269)

## 2014-05-09 ENCOUNTER — Telehealth: Payer: Self-pay | Admitting: Internal Medicine

## 2014-05-09 ENCOUNTER — Telehealth: Payer: Self-pay | Admitting: Family Medicine

## 2014-05-09 ENCOUNTER — Other Ambulatory Visit: Payer: Self-pay | Admitting: *Deleted

## 2014-05-09 DIAGNOSIS — F329 Major depressive disorder, single episode, unspecified: Secondary | ICD-10-CM

## 2014-05-09 DIAGNOSIS — F3289 Other specified depressive episodes: Secondary | ICD-10-CM

## 2014-05-09 MED ORDER — BUPROPION HCL ER (XL) 300 MG PO TB24: 300.0000 mg | ORAL_TABLET | Freq: Every day | ORAL | Status: DC

## 2014-05-09 MED ORDER — PANTOPRAZOLE SODIUM 40 MG PO TBEC: DELAYED_RELEASE_TABLET | ORAL | Status: DC

## 2014-05-09 MED ORDER — LORAZEPAM 0.5 MG PO TABS: 0.5000 mg | ORAL_TABLET | Freq: Two times a day (BID) | ORAL | Status: DC

## 2014-05-09 MED ORDER — EZETIMIBE 10 MG PO TABS: ORAL_TABLET | ORAL | Status: DC

## 2014-05-09 MED ORDER — FEXOFENADINE HCL 180 MG PO TABS: 180.0000 mg | ORAL_TABLET | Freq: Every day | ORAL | Status: DC | PRN

## 2014-05-09 NOTE — Telephone Encounter (Signed)
Rx Refilled  

## 2014-05-09 NOTE — Telephone Encounter (Signed)
Patient is calling to get 90 day supply of zedia and protonix, to be faxed to [facility] fax number is [phone]

## 2014-05-09 NOTE — Telephone Encounter (Signed)
Called spoke w/ pt. She needed her allegra faxed to [facility] at the fax # above. i have done so. Nothing further needed

## 2014-05-10 DIAGNOSIS — R4189 Other symptoms and signs involving cognitive functions and awareness: Secondary | ICD-10-CM | POA: Insufficient documentation

## 2014-05-10 DIAGNOSIS — R42 Dizziness and giddiness: Secondary | ICD-10-CM

## 2014-05-10 DIAGNOSIS — R262 Difficulty in walking, not elsewhere classified: Secondary | ICD-10-CM | POA: Insufficient documentation

## 2014-05-10 DIAGNOSIS — R4701 Aphasia: Secondary | ICD-10-CM | POA: Insufficient documentation

## 2014-05-10 DIAGNOSIS — R1310 Dysphagia, unspecified: Secondary | ICD-10-CM | POA: Insufficient documentation

## 2014-05-10 DIAGNOSIS — G44229 Chronic tension-type headache, not intractable: Secondary | ICD-10-CM | POA: Insufficient documentation

## 2014-05-10 DIAGNOSIS — I69398 Other sequelae of cerebral infarction: Secondary | ICD-10-CM | POA: Insufficient documentation

## 2014-05-11 ENCOUNTER — Other Ambulatory Visit: Payer: Self-pay | Admitting: *Deleted

## 2014-05-11 MED ORDER — EZETIMIBE 10 MG PO TABS: ORAL_TABLET | ORAL | Status: DC

## 2014-06-04 NOTE — Assessment & Plan Note (Signed)
She says GI has told her to "live with it" as far as additional measures for her GERD Breathing is adequately controlled now. She and her husband are dealing with other health problems

## 2014-06-04 NOTE — Assessment & Plan Note (Signed)
Adequate control. Discussed use of air cleaner.

## 2014-06-17 ENCOUNTER — Telehealth: Payer: Self-pay | Admitting: Internal Medicine

## 2014-06-17 ENCOUNTER — Other Ambulatory Visit: Payer: Self-pay | Admitting: Nurse Practitioner

## 2014-06-17 DIAGNOSIS — F329 Major depressive disorder, single episode, unspecified: Secondary | ICD-10-CM

## 2014-06-17 DIAGNOSIS — F3289 Other specified depressive episodes: Secondary | ICD-10-CM

## 2014-06-17 MED ORDER — BECLOMETHASONE DIPROP MONOHYD 42 MCG/SPRAY NA SUSP: 2.0000 | Freq: Two times a day (BID) | NASAL | Status: DC

## 2014-06-17 MED ORDER — AZELASTINE HCL 0.1 % NA SOLN: 1.0000 | Freq: Two times a day (BID) | NASAL | Status: DC

## 2014-06-17 MED ORDER — ALBUTEROL SULFATE (2.5 MG/3ML) 0.083% IN NEBU: 2.5000 mg | INHALATION_SOLUTION | Freq: Four times a day (QID) | RESPIRATORY_TRACT | Status: DC | PRN

## 2014-06-17 MED ORDER — BUPROPION HCL ER (XL) 300 MG PO TB24: 300.0000 mg | ORAL_TABLET | Freq: Every day | ORAL | Status: DC

## 2014-06-17 MED ORDER — LORAZEPAM 0.5 MG PO TABS: 0.5000 mg | ORAL_TABLET | Freq: Two times a day (BID) | ORAL | Status: DC

## 2014-06-17 MED ORDER — CLOPIDOGREL BISULFATE 75 MG PO TABS: 75.0000 mg | ORAL_TABLET | Freq: Every day | ORAL | Status: DC

## 2014-06-17 MED ORDER — ALBUTEROL SULFATE HFA 108 (90 BASE) MCG/ACT IN AERS: 2.0000 | INHALATION_SPRAY | RESPIRATORY_TRACT | Status: DC | PRN

## 2014-06-17 NOTE — Telephone Encounter (Signed)
Spoke with the pt  She states needing refills faxed to [facility] were printed and faxed

## 2014-06-27 ENCOUNTER — Ambulatory Visit: Payer: Medicare Other | Admitting: Hematology & Oncology

## 2014-06-27 ENCOUNTER — Other Ambulatory Visit: Payer: Medicare Other | Admitting: Lab

## 2014-06-29 ENCOUNTER — Ambulatory Visit (HOSPITAL_BASED_OUTPATIENT_CLINIC_OR_DEPARTMENT_OTHER): Payer: Medicare Other | Admitting: Hematology & Oncology

## 2014-06-29 ENCOUNTER — Other Ambulatory Visit (HOSPITAL_BASED_OUTPATIENT_CLINIC_OR_DEPARTMENT_OTHER): Payer: Medicare Other | Admitting: Lab

## 2014-06-29 VITALS — BP 126/66 | HR 81 | Temp 98.0°F | Resp 18 | Wt 200.0 lb

## 2014-06-29 DIAGNOSIS — D45 Polycythemia vera: Secondary | ICD-10-CM

## 2014-06-29 LAB — CBC WITH DIFFERENTIAL (CANCER CENTER ONLY)
BASO#: 0 10*3/uL (ref 0.0–0.2)
BASO%: 0.4 % (ref 0.0–2.0)
EOS%: 2.2 % (ref 0.0–7.0)
Eosinophils Absolute: 0.1 10*3/uL (ref 0.0–0.5)
HCT: 35.2 % (ref 34.8–46.6)
HGB: 10.8 g/dL — ABNORMAL LOW (ref 11.6–15.9)
LYMPH#: 1.3 10*3/uL (ref 0.9–3.3)
LYMPH%: 24.4 % (ref 14.0–48.0)
MCH: 21.4 pg — AB (ref 26.0–34.0)
MCHC: 30.7 g/dL — ABNORMAL LOW (ref 32.0–36.0)
MCV: 70 fL — ABNORMAL LOW (ref 81–101)
MONO#: 0.5 10*3/uL (ref 0.1–0.9)
MONO%: 8.4 % (ref 0.0–13.0)
NEUT#: 3.5 10*3/uL (ref 1.5–6.5)
NEUT%: 64.6 % (ref 39.6–80.0)
PLATELETS: 272 10*3/uL (ref 145–400)
RBC: 5.05 10*6/uL (ref 3.70–5.32)
RDW: 17.5 % — AB (ref 11.1–15.7)
WBC: 5.5 10*3/uL (ref 3.9–10.0)

## 2014-06-29 LAB — COMPREHENSIVE METABOLIC PANEL
ALT: 21 U/L (ref 0–35)
AST: 23 U/L (ref 0–37)
Albumin: 4 g/dL (ref 3.5–5.2)
Alkaline Phosphatase: 61 U/L (ref 39–117)
BUN: 13 mg/dL (ref 6–23)
CO2: 25 meq/L (ref 19–32)
Calcium: 9.1 mg/dL (ref 8.4–10.5)
Chloride: 104 mEq/L (ref 96–112)
Creatinine, Ser: 0.79 mg/dL (ref 0.50–1.10)
GLUCOSE: 93 mg/dL (ref 70–99)
Potassium: 4.7 mEq/L (ref 3.5–5.3)
SODIUM: 137 meq/L (ref 135–145)
TOTAL PROTEIN: 6.3 g/dL (ref 6.0–8.3)
Total Bilirubin: 0.5 mg/dL (ref 0.2–1.2)
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
## 2014-06-30 LAB — IRON AND TIBC CHCC
%SAT: 10 % — ABNORMAL LOW (ref 21–57)
IRON: 49 ug/dL (ref 41–142)
TIBC: 476 ug/dL — ABNORMAL HIGH (ref 236–444)
UIBC: 428 ug/dL — ABNORMAL HIGH (ref 120–384)

## 2014-06-30 LAB — FERRITIN CHCC: Ferritin: 5 ng/ml — ABNORMAL LOW (ref 9–269)

## 2014-07-04 ENCOUNTER — Ambulatory Visit: Payer: Medicare Other | Admitting: Neurology

## 2014-07-06 ENCOUNTER — Telehealth: Payer: Self-pay | Admitting: Neurology

## 2014-07-06 NOTE — Telephone Encounter (Signed)
Pt called on 03/25/14 to cancel her 07/04/14 appt. She stated that she was going to see another provider.

## 2014-07-13 ENCOUNTER — Encounter: Payer: Self-pay | Admitting: *Deleted

## 2014-08-24 ENCOUNTER — Ambulatory Visit (HOSPITAL_BASED_OUTPATIENT_CLINIC_OR_DEPARTMENT_OTHER): Payer: Medicare Other | Admitting: Hematology & Oncology

## 2014-08-24 ENCOUNTER — Encounter: Payer: Self-pay | Admitting: Hematology & Oncology

## 2014-08-24 ENCOUNTER — Other Ambulatory Visit (HOSPITAL_BASED_OUTPATIENT_CLINIC_OR_DEPARTMENT_OTHER): Payer: Medicare Other | Admitting: Lab

## 2014-08-24 VITALS — BP 106/70 | HR 72 | Temp 98.1°F | Resp 16 | Ht 67.0 in | Wt 199.0 lb

## 2014-08-24 DIAGNOSIS — D45 Polycythemia vera: Secondary | ICD-10-CM

## 2014-08-24 LAB — CBC WITH DIFFERENTIAL (CANCER CENTER ONLY)
BASO#: 0 10*3/uL (ref 0.0–0.2)
BASO%: 0.4 % (ref 0.0–2.0)
EOS ABS: 0.1 10*3/uL (ref 0.0–0.5)
EOS%: 1.8 % (ref 0.0–7.0)
HCT: 35.8 % (ref 34.8–46.6)
HGB: 11 g/dL — ABNORMAL LOW (ref 11.6–15.9)
LYMPH#: 1.6 10*3/uL (ref 0.9–3.3)
LYMPH%: 23.4 % (ref 14.0–48.0)
MCH: 21.4 pg — ABNORMAL LOW (ref 26.0–34.0)
MCHC: 30.7 g/dL — ABNORMAL LOW (ref 32.0–36.0)
MCV: 70 fL — AB (ref 81–101)
MONO#: 0.7 10*3/uL (ref 0.1–0.9)
MONO%: 10.4 % (ref 0.0–13.0)
NEUT#: 4.3 10*3/uL (ref 1.5–6.5)
NEUT%: 64 % (ref 39.6–80.0)
PLATELETS: 327 10*3/uL (ref 145–400)
RBC: 5.14 10*6/uL (ref 3.70–5.32)
RDW: 18 % — ABNORMAL HIGH (ref 11.1–15.7)
WBC: 6.8 10*3/uL (ref 3.9–10.0)

## 2014-08-24 LAB — CMP (CANCER CENTER ONLY)
ALT: 26 U/L (ref 10–47)
AST: 24 U/L (ref 11–38)
Albumin: 3.5 g/dL (ref 3.3–5.5)
Alkaline Phosphatase: 64 U/L (ref 26–84)
BUN, Bld: 14 mg/dL (ref 7–22)
CALCIUM: 8.5 mg/dL (ref 8.0–10.3)
CHLORIDE: 104 meq/L (ref 98–108)
CO2: 25 mEq/L (ref 18–33)
Creat: 0.8 mg/dl (ref 0.6–1.2)
Glucose, Bld: 85 mg/dL (ref 73–118)
Potassium: 4.3 mEq/L (ref 3.3–4.7)
SODIUM: 142 meq/L (ref 128–145)
TOTAL PROTEIN: 6.8 g/dL (ref 6.4–8.1)
Total Bilirubin: 0.5 mg/dl (ref 0.20–1.60)

## 2014-08-25 LAB — IRON AND TIBC CHCC
%SAT: 5 % — ABNORMAL LOW (ref 21–57)
IRON: 28 ug/dL — AB (ref 41–142)
TIBC: 509 ug/dL — ABNORMAL HIGH (ref 236–444)
UIBC: 481 ug/dL — ABNORMAL HIGH (ref 120–384)

## 2014-08-25 LAB — FERRITIN CHCC

## 2014-08-25 NOTE — Progress Notes (Signed)
Hematology and Oncology Follow Up Visit  Natalie Hendrix [id number] 01/12/1951 63 y.o. 08/25/2014   Principle Diagnosis:   Polycythemia vera- JAK2 negative  Current Therapy:    Phlebotomy to maintain hematocrit less than 38%  Plavix 75 mg by mouth daily     Interim History:  Ms.  Hendrix is back for followup. She is doing pretty well. She is not in a cast right now. She is using a cane. However, she is getting about much better. She broke her left foot. This was back in July. This is his her to have healed up.  Her husband been quite sick. He was in the hospital. He had bowel function. He was hospitalized for about 3 or 4 days. Thankfully, he did not need any surgery.  Her neurological problems don't appear to be any worse.  She's had a good appetite. She's had no change in bowel or bladder habits.  Medications: Current outpatient prescriptions:albuterol (PROVENTIL HFA;VENTOLIN HFA) 108 (90 BASE) MCG/ACT inhaler, Inhale 2 puffs into the lungs every 4 (four) hours as needed for wheezing or shortness of breath., Disp: 3 Inhaler, Rfl: 3;  albuterol (PROVENTIL) (2.5 MG/3ML) 0.083% nebulizer solution, Take 3 mLs (2.5 mg total) by nebulization every 6 (six) hours as needed for wheezing or shortness of breath., Disp: 360 mL, Rfl: 3 Alpha Lipoic Acid 200 MG CAPS, Take by mouth., Disp: , Rfl: ;  azelastine (ASTELIN) 0.1 % nasal spray, Place 1 spray into both nostrils 2 (two) times daily. Use in each nostril as directed, Disp: 90 mL, Rfl: 3;  beclomethasone (BECONASE-AQ) 42 MCG/SPRAY nasal spray, Place 2 sprays into both nostrils 2 (two) times daily. Dose is for each nostril., Disp: 75 g, Rfl: 3 benzonatate (TESSALON) 100 MG capsule, Take 100 mg by mouth every 6 (six) hours as needed for cough., Disp: , Rfl: ;  buPROPion (WELLBUTRIN XL) 300 MG 24 hr tablet, Take 1 tablet (300 mg total) by mouth daily with breakfast., Disp: 90 tablet, Rfl: 3;  Calcium Carbonate-Vit D-Min (CALCIUM 1200 PO), Take 2,400 mg  by mouth daily. , Disp: , Rfl: ;  clopidogrel (PLAVIX) 75 MG tablet, Take 1 tablet (75 mg total) by mouth daily., Disp: 90 tablet, Rfl: 3 co-enzyme Q-10 30 MG capsule, Take 30 mg by mouth daily. , Disp: , Rfl: ;  cyclobenzaprine (FLEXERIL) 10 MG tablet, Take 10 mg by mouth 3 (three) times daily as needed for muscle spasms. , Disp: , Rfl: ;  ESTRACE VAGINAL 0.1 MG/GM vaginal cream, Place 1 Applicatorful vaginally daily. , Disp: , Rfl: ;  ezetimibe (ZETIA) 10 MG tablet, TAKE 1 TABLET BY MOUTH DAILY., Disp: 90 tablet, Rfl: 3 fexofenadine (ALLEGRA) 180 MG tablet, Take 1 tablet (180 mg total) by mouth daily as needed for allergies. As needed, Disp: 90 tablet, Rfl: 3;  Flaxseed, Linseed, (FLAXSEED OIL) 1200 MG CAPS, Take 1 capsule by mouth 1 day or 1 dose. , Disp: , Rfl: ;  Grape Seed Extract 30 MG CAPS, Take 1 capsule by mouth every morning. , Disp: , Rfl:  lidocaine (LIDODERM) 5 %, Place 1 patch onto the skin as needed (pain). Remove & Discard patch within 12 hours or as directed by MD, Disp: , Rfl: ;  LORazepam (ATIVAN) 0.5 MG tablet, Take 1 tablet (0.5 mg total) by mouth 2 (two) times daily. Take 2 tabs in morning and 2 tablets at bedtime, Disp: 180 tablet, Rfl: 3;  magnesium gluconate (MAGONATE) 500 MG tablet, Take 500 mg by mouth daily as needed (  constipation). , Disp: , Rfl:  meclizine (ANTIVERT) 25 MG tablet, Take 25 mg by mouth 3 (three) times daily as needed for dizziness. , Disp: , Rfl: ;  metoprolol succinate (TOPROL-XL) 50 MG 24 hr tablet, Take 1 tablet (50 mg total) by mouth daily with breakfast., Disp: 90 tablet, Rfl: 3;  Misc Natural Products (TART CHERRY ADVANCED PO), Take 5 mLs by mouth daily. Juice Concentrate- 1tsp daily, Disp: , Rfl:  morphine (MS CONTIN) 30 MG 12 hr tablet, Take 15 mg by mouth 2 (two) times daily. , Disp: , Rfl: ;  morphine (MSIR) 15 MG tablet, Take 15 mg by mouth every 4 (four) hours as needed for severe pain., Disp: , Rfl: ;  Multiple Vitamins-Minerals (CENTRUM SILVER) tablet,  Take 1 tablet by mouth daily., Disp: , Rfl: ;  Omega-3 Fatty Acids (FISH OIL) 1200 MG CAPS, Take 2,400 mg by mouth 2 (two) times daily., Disp: , Rfl:  pantoprazole (PROTONIX) 40 MG tablet, TAKE 1 TABLET (40 MG TOTAL) BY MOUTH DAILY., Disp: 90 tablet, Rfl: 3;  Polyethyl Glycol-Propyl Glycol (SYSTANE) 0.4-0.3 % SOLN, Apply to eye., Disp: , Rfl: ;  pravastatin (PRAVACHOL) 40 MG tablet, Take 1 tablet (40 mg total) by mouth at bedtime., Disp: 90 tablet, Rfl: 3;  Respiratory Therapy Supplies (NEBULIZER) DEVI, Use as directed, Disp: 1 each, Rfl: 0 riboflavin (VITAMIN B-2) 100 MG TABS, Take 200 mg by mouth 2 (two) times daily before a meal., Disp: , Rfl: ;  Turmeric Curcumin 500 MG CAPS, Take 1 capsule by mouth daily., Disp: , Rfl: ;  ZOVIRAX 5 %, Apply 1 application topically as needed (flair). , Disp: , Rfl: ;  promethazine-codeine (PHENERGAN WITH CODEINE) 6.25-10 MG/5ML syrup, Take 5 mLs by mouth every 4 (four) hours as needed for cough., Disp: 200 mL, Rfl: 0  Allergies:  Allergies  Allergen Reactions  . Qvar [Beclomethasone]   . Spiriva [Tiotropium Bromide Monohydrate]     rash  . Chlorzoxazone   . Epinephrine     Raises heart rate  . Lamotrigine   . Levofloxacin   . Neurontin [Gabapentin]     arthralgia  . Protriptyline Hcl     Severe constipation  . Ropinirole Hcl     arthralgia  . Tizanidine     panic  . Verapamil   . Zonegran     Mood swings  . Advair Diskus [Fluticasone-Salmeterol] Rash  . Baclofen Rash  . Chlorzoxazone Rash  . Levofloxacin Rash    Past Medical History, Surgical history, Social history, and Family History were reviewed and updated.  Review of Systems: As above  Physical Exam:  height is 5\' 7"  (1.702 m) and weight is 199 lb (90.266 kg). Her oral temperature is 98.1 F (36.7 C). Her blood pressure is 106/70 and her pulse is 72. Her respiration is 16 and oxygen saturation is 98%.   Well-developed and well-nourished white female. Head and neck exam shows Dr. or  oral lesions. She has no palpable cervical or supraclavicular lymph nodes. Lungs are clear bilaterally. Cardiac exam regular in rhythm with no murmurs rubs or bruits. Abdomen is soft. She has good bowel sounds. There is no fluid wave. There is no palpable liver spleen tip. Back exam shows no tenderness over the spine ribs or hips. Extremities shows no clubbing, cyanosis or edema. She has no rashes. Neurological exam is nonfocal.    Lab Results  Component Value Date   WBC 6.8 08/24/2014   HGB 11.0* 08/24/2014   HCT 35.8 08/24/2014  MCV 70* 08/24/2014   PLT 327 08/24/2014     Chemistry      Component Value Date/Time   NA 142 08/24/2014 1443   NA 137 06/29/2014 1523   K 4.3 08/24/2014 1443   K 4.7 06/29/2014 1523   CL 104 08/24/2014 1443   CL 104 06/29/2014 1523   CO2 25 08/24/2014 1443   CO2 25 06/29/2014 1523   BUN 14 08/24/2014 1443   BUN 13 06/29/2014 1523   CREATININE 0.8 08/24/2014 1443   CREATININE 0.79 06/29/2014 1523      Component Value Date/Time   CALCIUM 8.5 08/24/2014 1443   CALCIUM 9.1 06/29/2014 1523   ALKPHOS 64 08/24/2014 1443   ALKPHOS 61 06/29/2014 1523   AST 24 08/24/2014 1443   AST 23 06/29/2014 1523   ALT 26 08/24/2014 1443   ALT 21 06/29/2014 1523   BILITOT 0.50 08/24/2014 1443   BILITOT 0.5 06/29/2014 1523         Impression and Plan: Natalie Hendrix is 63 year old female with polycythemia. Thankfully, she has not had any neurological issues from this for a long time.  She does not be phlebotomized. I am sure that she is  iron  deficient. Her MCV has always been on the low side.  I will plan to see her back in 2 months time. [name], MD 9/24/20155:38 PM

## 2014-10-05 ENCOUNTER — Other Ambulatory Visit: Payer: Self-pay | Admitting: Obstetrics and Gynecology

## 2014-10-05 DIAGNOSIS — N644 Mastodynia: Secondary | ICD-10-CM

## 2014-10-13 ENCOUNTER — Other Ambulatory Visit: Payer: Medicare Other

## 2014-10-17 ENCOUNTER — Encounter: Payer: Self-pay | Admitting: Hematology & Oncology

## 2014-10-17 ENCOUNTER — Other Ambulatory Visit (HOSPITAL_BASED_OUTPATIENT_CLINIC_OR_DEPARTMENT_OTHER): Payer: Medicare Other | Admitting: Lab

## 2014-10-17 ENCOUNTER — Ambulatory Visit (HOSPITAL_BASED_OUTPATIENT_CLINIC_OR_DEPARTMENT_OTHER): Payer: Medicare Other | Admitting: Hematology & Oncology

## 2014-10-17 VITALS — BP 126/58 | HR 95 | Temp 98.1°F | Resp 16 | Ht 67.0 in | Wt 200.0 lb

## 2014-10-17 DIAGNOSIS — E611 Iron deficiency: Secondary | ICD-10-CM

## 2014-10-17 DIAGNOSIS — D45 Polycythemia vera: Secondary | ICD-10-CM

## 2014-10-17 DIAGNOSIS — R61 Generalized hyperhidrosis: Secondary | ICD-10-CM

## 2014-10-17 DIAGNOSIS — D751 Secondary polycythemia: Secondary | ICD-10-CM

## 2014-10-17 LAB — CBC WITH DIFFERENTIAL (CANCER CENTER ONLY)
BASO#: 0 10*3/uL (ref 0.0–0.2)
BASO%: 0.5 % (ref 0.0–2.0)
EOS%: 2.1 % (ref 0.0–7.0)
Eosinophils Absolute: 0.1 10*3/uL (ref 0.0–0.5)
HEMATOCRIT: 36.1 % (ref 34.8–46.6)
HEMOGLOBIN: 11 g/dL — AB (ref 11.6–15.9)
LYMPH#: 1.1 10*3/uL (ref 0.9–3.3)
LYMPH%: 24.5 % (ref 14.0–48.0)
MCH: 21.7 pg — AB (ref 26.0–34.0)
MCHC: 30.5 g/dL — AB (ref 32.0–36.0)
MCV: 71 fL — ABNORMAL LOW (ref 81–101)
MONO#: 0.4 10*3/uL (ref 0.1–0.9)
MONO%: 9 % (ref 0.0–13.0)
NEUT#: 2.8 10*3/uL (ref 1.5–6.5)
NEUT%: 63.9 % (ref 39.6–80.0)
Platelets: 255 10*3/uL (ref 145–400)
RBC: 5.06 10*6/uL (ref 3.70–5.32)
RDW: 19.2 % — AB (ref 11.1–15.7)
WBC: 4.3 10*3/uL (ref 3.9–10.0)

## 2014-10-17 LAB — CMP (CANCER CENTER ONLY)
ALT(SGPT): 33 U/L (ref 10–47)
AST: 34 U/L (ref 11–38)
Albumin: 3.6 g/dL (ref 3.3–5.5)
Alkaline Phosphatase: 54 U/L (ref 26–84)
BUN, Bld: 14 mg/dL (ref 7–22)
CALCIUM: 9.3 mg/dL (ref 8.0–10.3)
CHLORIDE: 102 meq/L (ref 98–108)
CO2: 25 meq/L (ref 18–33)
Creat: 0.7 mg/dl (ref 0.6–1.2)
GLUCOSE: 99 mg/dL (ref 73–118)
POTASSIUM: 4.3 meq/L (ref 3.3–4.7)
SODIUM: 146 meq/L — AB (ref 128–145)
TOTAL PROTEIN: 7 g/dL (ref 6.4–8.1)
Total Bilirubin: 0.7 mg/dl (ref 0.20–1.60)

## 2014-10-17 NOTE — Progress Notes (Signed)
Hematology and Oncology Follow Up Visit  KEARSTEN GINTHER [id number] 1951/01/14 63 y.o. 10/17/2014   Principle Diagnosis:   Polycythemia vera- JAK2 negative  Current Therapy:    Phlebotomy to maintain hematocrit less than 38%  Plavix 75 mg by mouth daily     Interim History:  Ms.  Forker is back for followup. She is complaining of sweats. These appear to be at nighttime and in the morning. These have been going on for a few months. I don't think she's lost any weight. She's not noted any abdominal pain. She's had no cough. She's had no fever. There's been no change in bowel or bladder habits.  She is finding it hard to get a see other doctors. She does not like her family doctor. She does not like her neurologist. I talked her about all this. Apparently, the neurologist wants her to go to [facility]. I don't think this is about idea.  Her neurological problems don't appear to be any worse.  She's had no rashes. There's been no leg swelling.  Medications: Current outpatient prescriptions: albuterol (PROVENTIL HFA;VENTOLIN HFA) 108 (90 BASE) MCG/ACT inhaler, Inhale 2 puffs into the lungs every 4 (four) hours as needed for wheezing or shortness of breath., Disp: 3 Inhaler, Rfl: 3;  albuterol (PROVENTIL) (2.5 MG/3ML) 0.083% nebulizer solution, Take 3 mLs (2.5 mg total) by nebulization every 6 (six) hours as needed for wheezing or shortness of breath., Disp: 360 mL, Rfl: 3 Alpha Lipoic Acid 200 MG CAPS, Take by mouth., Disp: , Rfl: ;  azelastine (ASTELIN) 0.1 % nasal spray, Place 1 spray into both nostrils 2 (two) times daily. Use in each nostril as directed, Disp: 90 mL, Rfl: 3;  beclomethasone (BECONASE-AQ) 42 MCG/SPRAY nasal spray, Place 2 sprays into both nostrils 2 (two) times daily. Dose is for each nostril., Disp: 75 g, Rfl: 3 benzonatate (TESSALON) 100 MG capsule, Take 100 mg by mouth every 6 (six) hours as needed for cough., Disp: , Rfl: ;  buPROPion (WELLBUTRIN XL) 300 MG 24 hr tablet, Take 1  tablet (300 mg total) by mouth daily with breakfast., Disp: 90 tablet, Rfl: 3;  Calcium Carbonate-Vit D-Min (CALCIUM 1200 PO), Take 2,400 mg by mouth daily. , Disp: , Rfl: ;  clopidogrel (PLAVIX) 75 MG tablet, Take 1 tablet (75 mg total) by mouth daily., Disp: 90 tablet, Rfl: 3 co-enzyme Q-10 30 MG capsule, Take 30 mg by mouth daily. , Disp: , Rfl: ;  cyclobenzaprine (FLEXERIL) 10 MG tablet, Take 10 mg by mouth 3 (three) times daily as needed for muscle spasms. , Disp: , Rfl: ;  ESTRACE VAGINAL 0.1 MG/GM vaginal cream, Place 1 Applicatorful vaginally daily. , Disp: , Rfl: ;  ezetimibe (ZETIA) 10 MG tablet, TAKE 1 TABLET BY MOUTH DAILY., Disp: 90 tablet, Rfl: 3 fexofenadine (ALLEGRA) 180 MG tablet, Take 1 tablet (180 mg total) by mouth daily as needed for allergies. As needed, Disp: 90 tablet, Rfl: 3;  Flaxseed, Linseed, (FLAXSEED OIL) 1200 MG CAPS, Take 1 capsule by mouth 1 day or 1 dose. , Disp: , Rfl: ;  Grape Seed Extract 30 MG CAPS, Take 1 capsule by mouth every morning. , Disp: , Rfl:  lidocaine (LIDODERM) 5 %, Place 1 patch onto the skin as needed (pain). Remove & Discard patch within 12 hours or as directed by MD, Disp: , Rfl: ;  LORazepam (ATIVAN) 0.5 MG tablet, Take 1 tablet (0.5 mg total) by mouth 2 (two) times daily. Take 2 tabs in morning and 2  tablets at bedtime, Disp: 180 tablet, Rfl: 3;  magnesium gluconate (MAGONATE) 500 MG tablet, Take 500 mg by mouth daily as needed (constipation). , Disp: , Rfl:  meclizine (ANTIVERT) 25 MG tablet, Take 25 mg by mouth 3 (three) times daily as needed for dizziness. , Disp: , Rfl: ;  metoprolol succinate (TOPROL-XL) 50 MG 24 hr tablet, Take 1 tablet (50 mg total) by mouth daily with breakfast., Disp: 90 tablet, Rfl: 3;  Misc Natural Products (TART CHERRY ADVANCED PO), Take 5 mLs by mouth daily. Juice Concentrate- 1tsp daily, Disp: , Rfl:  morphine (MS CONTIN) 30 MG 12 hr tablet, Take 15 mg by mouth 2 (two) times daily. , Disp: , Rfl: ;  morphine (MSIR) 15 MG  tablet, Take 15 mg by mouth every 4 (four) hours as needed for severe pain., Disp: , Rfl: ;  Multiple Vitamins-Minerals (CENTRUM SILVER) tablet, Take 1 tablet by mouth daily., Disp: , Rfl: ;  Omega-3 Fatty Acids (FISH OIL) 1200 MG CAPS, Take 2,400 mg by mouth 2 (two) times daily., Disp: , Rfl:  pantoprazole (PROTONIX) 40 MG tablet, TAKE 1 TABLET (40 MG TOTAL) BY MOUTH DAILY., Disp: 90 tablet, Rfl: 3;  Polyethyl Glycol-Propyl Glycol (SYSTANE) 0.4-0.3 % SOLN, Apply to eye., Disp: , Rfl: ;  pravastatin (PRAVACHOL) 40 MG tablet, Take 1 tablet (40 mg total) by mouth at bedtime., Disp: 90 tablet, Rfl: 3 promethazine-codeine (PHENERGAN WITH CODEINE) 6.25-10 MG/5ML syrup, Take 5 mLs by mouth every 4 (four) hours as needed for cough., Disp: 200 mL, Rfl: 0;  Respiratory Therapy Supplies (NEBULIZER) DEVI, Use as directed, Disp: 1 each, Rfl: 0;  riboflavin (VITAMIN B-2) 100 MG TABS, Take 200 mg by mouth 2 (two) times daily before a meal., Disp: , Rfl: ;  Turmeric Curcumin 500 MG CAPS, Take 1 capsule by mouth daily., Disp: , Rfl:  ZOVIRAX 5 %, Apply 1 application topically as needed (flair). , Disp: , Rfl:   Allergies:  Allergies  Allergen Reactions  . Qvar [Beclomethasone]   . Spiriva [Tiotropium Bromide Monohydrate]     rash  . Chlorzoxazone   . Epinephrine     Raises heart rate  . Lamotrigine   . Levofloxacin   . Neurontin [Gabapentin]     arthralgia  . Protriptyline Hcl     Severe constipation  . Ropinirole Hcl     arthralgia  . Tizanidine     panic  . Verapamil   . Zonegran     Mood swings  . Advair Diskus [Fluticasone-Salmeterol] Rash  . Baclofen Rash  . Chlorzoxazone Rash  . Levofloxacin Rash    Past Medical History, Surgical history, Social history, and Family History were reviewed and updated.  Review of Systems: As above  Physical Exam:  height is 5\' 7"  (1.702 m) and weight is 200 lb (90.719 kg). Her oral temperature is 98.1 F (36.7 C). Her blood pressure is 126/58 and her pulse  is 95. Her respiration is 16.   Well-developed and well-nourished white female. Head and neck exam shows Dr. or oral lesions. She has no palpable cervical or supraclavicular lymph nodes. Lungs are clear bilaterally. Cardiac exam regular in rhythm with no murmurs rubs or bruits. Abdomen is soft. She has good bowel sounds. There is no fluid wave. There is no palpable liver spleen tip. Back exam shows no tenderness over the spine ribs or hips. Extremities shows no clubbing, cyanosis or edema. She has no rashes. Neurological exam is nonfocal.     Lab Results  Component Value Date   WBC 4.3 10/17/2014   HGB 11.0* 10/17/2014   HCT 36.1 10/17/2014   MCV 71* 10/17/2014   PLT 255 10/17/2014     Chemistry      Component Value Date/Time   NA 146* 10/17/2014 1413   NA 137 06/29/2014 1523   K 4.3 10/17/2014 1413   K 4.7 06/29/2014 1523   CL 102 10/17/2014 1413   CL 104 06/29/2014 1523   CO2 25 10/17/2014 1413   CO2 25 06/29/2014 1523   BUN 14 10/17/2014 1413   BUN 13 06/29/2014 1523   CREATININE 0.7 10/17/2014 1413   CREATININE 0.79 06/29/2014 1523      Component Value Date/Time   CALCIUM 9.3 10/17/2014 1413   CALCIUM 9.1 06/29/2014 1523   ALKPHOS 54 10/17/2014 1413   ALKPHOS 61 06/29/2014 1523   AST 34 10/17/2014 1413   AST 23 06/29/2014 1523   ALT 33 10/17/2014 1413   ALT 21 06/29/2014 1523   BILITOT 0.70 10/17/2014 1413   BILITOT 0.5 06/29/2014 1523         Impression and Plan: Ms. Folker is 63 year-old female with polycythemia. Thankfully, she has not had any neurological issues from this for a long time.  I'm not sure was going off these sweats. I do think that she needs to have a CT scan. She does have chronic liver issues.it is possible that she may have a hepatic issue.  She is clearly iron deficient.   I will plan to see her back in 6 weeks      [name], MD 11/16/20156:35 PM

## 2014-10-18 LAB — IRON AND TIBC CHCC
%SAT: 10 % — ABNORMAL LOW (ref 21–57)
Iron: 50 ug/dL (ref 41–142)
TIBC: 503 ug/dL — ABNORMAL HIGH (ref 236–444)
UIBC: 453 ug/dL — ABNORMAL HIGH (ref 120–384)

## 2014-10-18 LAB — FERRITIN CHCC: Ferritin: 7 ng/ml — ABNORMAL LOW (ref 9–269)

## 2014-10-20 ENCOUNTER — Ambulatory Visit: Payer: Medicare Other | Admitting: Internal Medicine

## 2014-10-24 ENCOUNTER — Ambulatory Visit
Admission: RE | Admit: 2014-10-24 | Discharge: 2014-10-24 | Disposition: A | Discharge status: Home / Self Care | Payer: Medicare Other | Source: Ambulatory Visit | Attending: Hematology & Oncology | Admitting: Hematology & Oncology

## 2014-10-24 DIAGNOSIS — D45 Polycythemia vera: Secondary | ICD-10-CM

## 2014-10-24 MED ORDER — IOHEXOL 300 MG/ML  SOLN
125.0000 mL | Freq: Once | INTRAMUSCULAR | Status: AC | PRN
  Administered 2014-10-24: 125 mL via INTRAVENOUS

## 2014-10-25 ENCOUNTER — Telehealth: Payer: Self-pay | Admitting: *Deleted

## 2014-10-25 ENCOUNTER — Other Ambulatory Visit: Payer: Medicare Other

## 2014-10-25 NOTE — Telephone Encounter (Addendum)
Left message ----- Message from [name], MD sent at 10/24/2014  6:24 PM EST ----- Please call and let her know that the CAT scans are all normal area nothing on the CAT scans to explain her sweats or other symptoms. Natalie Hendrix

## 2014-11-07 ENCOUNTER — Other Ambulatory Visit: Payer: Medicare Other

## 2014-11-14 ENCOUNTER — Ambulatory Visit: Payer: Medicare Other | Admitting: Internal Medicine

## 2014-11-17 ENCOUNTER — Other Ambulatory Visit: Payer: Medicare Other

## 2014-11-30 ENCOUNTER — Telehealth: Payer: Self-pay | Admitting: Hematology & Oncology

## 2014-11-30 ENCOUNTER — Ambulatory Visit: Payer: Medicare Other | Admitting: Hematology & Oncology

## 2014-11-30 ENCOUNTER — Other Ambulatory Visit: Payer: Medicare Other | Admitting: Lab

## 2014-11-30 NOTE — Telephone Encounter (Signed)
Patient's husband called and cx 11/30/14 apt due to patient being sick.  He stated they will call back to resch once she is feeling better

## 2014-12-01 ENCOUNTER — Other Ambulatory Visit: Payer: Medicare Other

## 2014-12-12 ENCOUNTER — Other Ambulatory Visit: Payer: Self-pay | Admitting: *Deleted

## 2014-12-12 DIAGNOSIS — D751 Secondary polycythemia: Secondary | ICD-10-CM

## 2014-12-12 MED ORDER — LORAZEPAM 0.5 MG PO TABS: 0.5000 mg | ORAL_TABLET | Freq: Two times a day (BID) | ORAL | Status: DC

## 2014-12-12 MED ORDER — PANTOPRAZOLE SODIUM 40 MG PO TBEC: DELAYED_RELEASE_TABLET | ORAL | Status: DC

## 2014-12-22 ENCOUNTER — Encounter: Payer: Self-pay | Admitting: *Deleted

## 2014-12-29 ENCOUNTER — Other Ambulatory Visit: Admitting: Lab

## 2014-12-29 ENCOUNTER — Ambulatory Visit: Payer: Medicare Other | Admitting: Internal Medicine

## 2014-12-29 ENCOUNTER — Telehealth: Payer: Self-pay | Admitting: Hematology & Oncology

## 2014-12-29 ENCOUNTER — Ambulatory Visit: Admitting: Hematology & Oncology

## 2014-12-29 NOTE — Telephone Encounter (Signed)
Pt moved 1-28 to 2-12 her husband is sick

## 2015-01-13 ENCOUNTER — Ambulatory Visit (HOSPITAL_BASED_OUTPATIENT_CLINIC_OR_DEPARTMENT_OTHER): Payer: Medicare Other | Admitting: Hematology & Oncology

## 2015-01-13 ENCOUNTER — Encounter: Payer: Self-pay | Admitting: Hematology & Oncology

## 2015-01-13 ENCOUNTER — Other Ambulatory Visit (HOSPITAL_BASED_OUTPATIENT_CLINIC_OR_DEPARTMENT_OTHER): Payer: Medicare Other | Admitting: Lab

## 2015-01-13 VITALS — BP 110/56 | HR 82 | Temp 98.2°F | Resp 16 | Ht 67.0 in | Wt 202.0 lb

## 2015-01-13 DIAGNOSIS — E611 Iron deficiency: Secondary | ICD-10-CM | POA: Diagnosis not present

## 2015-01-13 DIAGNOSIS — D45 Polycythemia vera: Secondary | ICD-10-CM

## 2015-01-13 LAB — LACTATE DEHYDROGENASE: LDH: 171 U/L (ref 94–250)

## 2015-01-13 LAB — CBC WITH DIFFERENTIAL (CANCER CENTER ONLY)
BASO#: 0 10*3/uL (ref 0.0–0.2)
BASO%: 0.6 % (ref 0.0–2.0)
EOS%: 2.8 % (ref 0.0–7.0)
Eosinophils Absolute: 0.2 10*3/uL (ref 0.0–0.5)
HCT: 34.5 % — ABNORMAL LOW (ref 34.8–46.6)
HEMOGLOBIN: 10.4 g/dL — AB (ref 11.6–15.9)
LYMPH#: 1.6 10*3/uL (ref 0.9–3.3)
LYMPH%: 30.2 % (ref 14.0–48.0)
MCH: 21.5 pg — AB (ref 26.0–34.0)
MCHC: 30.1 g/dL — ABNORMAL LOW (ref 32.0–36.0)
MCV: 71 fL — AB (ref 81–101)
MONO#: 0.6 10*3/uL (ref 0.1–0.9)
MONO%: 12.1 % (ref 0.0–13.0)
NEUT#: 2.9 10*3/uL (ref 1.5–6.5)
NEUT%: 54.3 % (ref 39.6–80.0)
PLATELETS: 269 10*3/uL (ref 145–400)
RBC: 4.83 10*6/uL (ref 3.70–5.32)
RDW: 16.9 % — ABNORMAL HIGH (ref 11.1–15.7)
WBC: 5.3 10*3/uL (ref 3.9–10.0)

## 2015-01-13 LAB — COMPREHENSIVE METABOLIC PANEL
ALBUMIN: 3.8 g/dL (ref 3.5–5.2)
ALK PHOS: 57 U/L (ref 39–117)
ALT: 19 U/L (ref 0–35)
AST: 23 U/L (ref 0–37)
BILIRUBIN TOTAL: 0.4 mg/dL (ref 0.2–1.2)
BUN: 16 mg/dL (ref 6–23)
CO2: 28 mEq/L (ref 19–32)
CREATININE: 0.79 mg/dL (ref 0.50–1.10)
Calcium: 9.1 mg/dL (ref 8.4–10.5)
Chloride: 107 mEq/L (ref 96–112)
GLUCOSE: 107 mg/dL — AB (ref 70–99)
POTASSIUM: 4.6 meq/L (ref 3.5–5.3)
Sodium: 140 mEq/L (ref 135–145)
Total Protein: 6.2 g/dL (ref 6.0–8.3)

## 2015-01-13 NOTE — Progress Notes (Signed)
Hematology and Oncology Follow Up Visit  NEVENA ROZENBERG [id number] 1951-06-28 64 y.o. 01/13/2015   Principle Diagnosis:   Polycythemia vera- JAK2 negative  Current Therapy:    Phlebotomy to maintain hematocrit less than 38%  Plavix 75 mg by mouth daily     Interim History:  Ms.  Hulsey is back for followup. She is doing okay. He saw before the holidays. She got to the holidays all right. She still is looking for a neurologist.  We did go ahead and get a CT scan on her back in November. This is because of some sweats. Thankfully, the CT scans did not show any suspicious findings that would suggest malignancy or vascular issues.  She has seen several neurologists who really cannot help her from what she says.  She is had some falling. She just seems to get dizzy.  She's had no bleeding. She is on Plavix.  She is thinking about having some teeth pulled. She has seen the oral surgeon. She will see him back in early April and will discuss some more about this. I told her that if she needed teeth taken out that she should only have as few teeth taken out as possible.  She's had no fever. She's had no change in bowel or bladder habits. Medications:  Current outpatient prescriptions:  .  albuterol (PROVENTIL HFA;VENTOLIN HFA) 108 (90 BASE) MCG/ACT inhaler, Inhale 2 puffs into the lungs every 4 (four) hours as needed for wheezing or shortness of breath., Disp: 3 Inhaler, Rfl: 3 .  albuterol (PROVENTIL) (2.5 MG/3ML) 0.083% nebulizer solution, Take 3 mLs (2.5 mg total) by nebulization every 6 (six) hours as needed for wheezing or shortness of breath., Disp: 360 mL, Rfl: 3 .  Alpha Lipoic Acid 200 MG CAPS, Take by mouth., Disp: , Rfl:  .  azelastine (ASTELIN) 0.1 % nasal spray, Place 1 spray into both nostrils 2 (two) times daily. Use in each nostril as directed, Disp: 90 mL, Rfl: 3 .  beclomethasone (BECONASE-AQ) 42 MCG/SPRAY nasal spray, Place 2 sprays into both nostrils 2 (two) times daily.  Dose is for each nostril., Disp: 75 g, Rfl: 3 .  benzonatate (TESSALON) 100 MG capsule, Take 100 mg by mouth every 6 (six) hours as needed for cough., Disp: , Rfl:  .  buPROPion (WELLBUTRIN XL) 300 MG 24 hr tablet, Take 1 tablet (300 mg total) by mouth daily with breakfast., Disp: 90 tablet, Rfl: 3 .  Calcium Carbonate-Vit D-Min (CALCIUM 1200 PO), Take 2,400 mg by mouth daily. , Disp: , Rfl:  .  clopidogrel (PLAVIX) 75 MG tablet, Take 1 tablet (75 mg total) by mouth daily., Disp: 90 tablet, Rfl: 3 .  co-enzyme Q-10 30 MG capsule, Take 30 mg by mouth daily. , Disp: , Rfl:  .  cyclobenzaprine (FLEXERIL) 10 MG tablet, Take 10 mg by mouth 3 (three) times daily as needed for muscle spasms. , Disp: , Rfl:  .  ESTRACE VAGINAL 0.1 MG/GM vaginal cream, Place 1 Applicatorful vaginally daily. , Disp: , Rfl:  .  ezetimibe (ZETIA) 10 MG tablet, TAKE 1 TABLET BY MOUTH DAILY., Disp: 90 tablet, Rfl: 3 .  fexofenadine (ALLEGRA) 180 MG tablet, Take 1 tablet (180 mg total) by mouth daily as needed for allergies. As needed, Disp: 90 tablet, Rfl: 3 .  Flaxseed, Linseed, (FLAXSEED OIL) 1200 MG CAPS, Take 1 capsule by mouth 1 day or 1 dose. , Disp: , Rfl:  .  Grape Seed Extract 30 MG CAPS, Take 1  capsule by mouth every morning. , Disp: , Rfl:  .  lidocaine (LIDODERM) 5 %, Place 1 patch onto the skin as needed (pain). Remove & Discard patch within 12 hours or as directed by MD, Disp: , Rfl:  .  LORazepam (ATIVAN) 0.5 MG tablet, Take 1 tablet (0.5 mg total) by mouth 2 (two) times daily. Take 2 tabs in morning and 2 tablets at bedtime, Disp: 180 tablet, Rfl: 3 .  magnesium gluconate (MAGONATE) 500 MG tablet, Take 500 mg by mouth daily as needed (constipation). , Disp: , Rfl:  .  meclizine (ANTIVERT) 25 MG tablet, Take 25 mg by mouth 3 (three) times daily as needed for dizziness. , Disp: , Rfl:  .  metoprolol succinate (TOPROL-XL) 50 MG 24 hr tablet, Take 1 tablet (50 mg total) by mouth daily with breakfast., Disp: 90 tablet,  Rfl: 3 .  Misc Natural Products (TART CHERRY ADVANCED PO), Take 5 mLs by mouth daily. Juice Concentrate- 1tsp daily, Disp: , Rfl:  .  morphine (MS CONTIN) 30 MG 12 hr tablet, Take 15 mg by mouth 2 (two) times daily. , Disp: , Rfl:  .  morphine (MSIR) 15 MG tablet, Take 15 mg by mouth every 4 (four) hours as needed for severe pain., Disp: , Rfl:  .  Multiple Vitamins-Minerals (CENTRUM SILVER) tablet, Take 1 tablet by mouth daily., Disp: , Rfl:  .  Omega-3 Fatty Acids (FISH OIL) 1200 MG CAPS, Take 2,400 mg by mouth 2 (two) times daily., Disp: , Rfl:  .  pantoprazole (PROTONIX) 40 MG tablet, TAKE 1 TABLET (40 MG TOTAL) BY MOUTH DAILY., Disp: 90 tablet, Rfl: 3 .  Polyethyl Glycol-Propyl Glycol (SYSTANE) 0.4-0.3 % SOLN, Apply to eye., Disp: , Rfl:  .  pravastatin (PRAVACHOL) 40 MG tablet, Take 1 tablet (40 mg total) by mouth at bedtime., Disp: 90 tablet, Rfl: 3 .  promethazine-codeine (PHENERGAN WITH CODEINE) 6.25-10 MG/5ML syrup, Take 5 mLs by mouth every 4 (four) hours as needed for cough., Disp: 200 mL, Rfl: 0 .  Respiratory Therapy Supplies (NEBULIZER) DEVI, Use as directed, Disp: 1 each, Rfl: 0 .  riboflavin (VITAMIN B-2) 100 MG TABS, Take 200 mg by mouth 2 (two) times daily before a meal., Disp: , Rfl:  .  Turmeric Curcumin 500 MG CAPS, Take 1 capsule by mouth daily., Disp: , Rfl:  .  ZOVIRAX 5 %, Apply 1 application topically as needed (flair). , Disp: , Rfl:   Allergies:  Allergies  Allergen Reactions  . Qvar [Beclomethasone]   . Spiriva [Tiotropium Bromide Monohydrate]     rash  . Chlorzoxazone   . Epinephrine     Raises heart rate  . Lamotrigine   . Levofloxacin   . Neurontin [Gabapentin]     arthralgia  . Protriptyline Hcl     Severe constipation  . Ropinirole Hcl     arthralgia  . Tizanidine     panic  . Verapamil   . Zonegran     Mood swings  . Advair Diskus [Fluticasone-Salmeterol] Rash  . Baclofen Rash  . Chlorzoxazone Rash  . Levofloxacin Rash    Past Medical  History, Surgical history, Social history, and Family History were reviewed and updated.  Review of Systems: As above  Physical Exam:  height is 5\' 7"  (1.702 m) and weight is 202 lb (91.627 kg). Her oral temperature is 98.2 F (36.8 C). Her blood pressure is 110/56 and her pulse is 82. Her respiration is 16.   Well-developed and well-nourished  white female. Head and neck exam shows Dr. or oral lesions. She has no palpable cervical or supraclavicular lymph nodes. Lungs are clear bilaterally. Cardiac exam regular in rhythm with no murmurs rubs or bruits. Abdomen is soft. She has good bowel sounds. There is no fluid wave. There is no palpable liver spleen tip. Back exam shows no tenderness over the spine ribs or hips. Extremities shows no clubbing, cyanosis or edema. She has no rashes. Neurological exam is nonfocal.     Lab Results  Component Value Date   WBC 5.3 01/13/2015   HGB 10.4* 01/13/2015   HCT 34.5* 01/13/2015   MCV 71* 01/13/2015   PLT 269 01/13/2015     Chemistry      Component Value Date/Time   NA 146* 10/17/2014 1413   NA 137 06/29/2014 1523   K 4.3 10/17/2014 1413   K 4.7 06/29/2014 1523   CL 102 10/17/2014 1413   CL 104 06/29/2014 1523   CO2 25 10/17/2014 1413   CO2 25 06/29/2014 1523   BUN 14 10/17/2014 1413   BUN 13 06/29/2014 1523   CREATININE 0.7 10/17/2014 1413   CREATININE 0.79 06/29/2014 1523      Component Value Date/Time   CALCIUM 9.3 10/17/2014 1413   CALCIUM 9.1 06/29/2014 1523   ALKPHOS 54 10/17/2014 1413   ALKPHOS 61 06/29/2014 1523   AST 34 10/17/2014 1413   AST 23 06/29/2014 1523   ALT 33 10/17/2014 1413   ALT 21 06/29/2014 1523   BILITOT 0.70 10/17/2014 1413   BILITOT 0.5 06/29/2014 1523         Impression and Plan: Ms. Loyd is 64 year-old female with polycythemia. Thankfully, she has not had any neurological issues from this for a long time.  I for now, we'll plan to get her back in 6 weeks.  Again I don't see any problems with  her having teeth taken out. I would not expect any problems with bleeding. I would not think that she would have any problems healing.  She is clearly iron deficient.    I spent about 30 minutes with she and her husband today.     [name], MD 2/12/20163:12 PM

## 2015-01-14 LAB — VITAMIN D 25 HYDROXY (VIT D DEFICIENCY, FRACTURES): Vit D, 25-Hydroxy: 30 ng/mL (ref 30–100)

## 2015-01-16 LAB — FERRITIN CHCC: Ferritin: 6 ng/ml — ABNORMAL LOW (ref 9–269)

## 2015-01-16 LAB — IRON AND TIBC CHCC
%SAT: 5 % — AB (ref 21–57)
IRON: 24 ug/dL — AB (ref 41–142)
TIBC: 454 ug/dL — ABNORMAL HIGH (ref 236–444)
UIBC: 430 ug/dL — AB (ref 120–384)

## 2015-01-18 ENCOUNTER — Ambulatory Visit (INDEPENDENT_AMBULATORY_CARE_PROVIDER_SITE_OTHER): Payer: Medicare Other | Admitting: Internal Medicine

## 2015-01-18 ENCOUNTER — Encounter: Payer: Self-pay | Admitting: Internal Medicine

## 2015-01-18 VITALS — BP 126/68 | HR 89 | Ht 67.0 in | Wt 204.0 lb

## 2015-01-18 DIAGNOSIS — J449 Chronic obstructive pulmonary disease, unspecified: Secondary | ICD-10-CM

## 2015-01-18 DIAGNOSIS — J302 Other seasonal allergic rhinitis: Secondary | ICD-10-CM

## 2015-01-18 DIAGNOSIS — J309 Allergic rhinitis, unspecified: Secondary | ICD-10-CM

## 2015-01-18 DIAGNOSIS — J45998 Other asthma: Secondary | ICD-10-CM

## 2015-01-18 DIAGNOSIS — J3089 Other allergic rhinitis: Secondary | ICD-10-CM

## 2015-01-18 NOTE — Progress Notes (Signed)
Subjective:   07/22/11- 64 yoF here on kind referral by Dr. Ernst [place] because of asthma and history of pneumonia with cough. Husband here. She was a patient of mine in the [place] at the old practice. She gives a history of asthma and bronchitis. Upper respiratory problems have been more evident since November of 2011. Coughs easily while eating. Coughs each morning bringing up small pellets of green. Wakes nauseated in the morning until she coughs her chest clear. Nebulizer treatments at her doctors for this has seemed very helpful and she would like to have a machine at home. Wears a mask outside and finds she is much bothered by strong odors heat and humidity. She is using Qvar 40 twice daily but blames that for burning in her mouth despite aggressive anti-thrush management. Using Beconase AQ nasal spray, a rescue inhaler once or twice daily and Tessalon pearls.  She feels distinctly better indoors where her husband has installed an extensive air filter system as well as individual room air cleaners in several rooms. She has encasings on bending and they are careful about dust control. Allergy skin testing in the past was positive without allergy vaccine. She has a history of polycythemia vera followed by Dr. Marin Olp with phlebotomy. Polycythemia is planned for cerebrovascular accident which has left her with cognitive and speech difficulties. She also has history of irregular heartbeat on Toprol. Pneumonia in 1978. No history of deep vein thrombosis or pulmonary embolism. Pneumococcal vaccine twice. Flu vaccine last year with blamed recurrent episodes of bronchitis during the winter. Thoracic steal syndrome was addressed with vascular reconstruction on the right in the 1980s. Chest x-ray report from 06/12/2011 described minimal right basilar atelectasis with no acute infiltrate identified, improved from earlier film. Never smoked. Family history a sister with severe allergy problems, and  with COPD, father worked with asbestos exposure and was a heavy smoker   08/23/11- 24 yoF never smoker, asthma and history of pneumonia with cough, complicated by past history surgery for thoracic outlet syndrome, CVA, allergic rhinitis, polycythemia. After last visit August 20, I have some concern that what she was describing was related to upper airway control after her stroke. She comes today for followup of lab work. ONOX-oxygen desaturation equal or less than 89% on room air for 15.4 minutes, less than or equal to 88% for 9.9 minutes, and lowest recorded saturation 82% on 07/29/2011. This could justify home oxygen for sleep. PFT-08/01/2011-normal baseline spirometry with some small airway response to bronchodilator, normal lung volumes, diffusion moderately reduced. FEV1/FVC 0.85, TLC 96%, DLCO 62%. Alpha I antitrypsin assessment: Normal MM. Speech therapy modified barium swallow was performed 08/01/2011 but I do not yet have the therapist report. I have counseled her care with swallowing, careful chewing and swallowing, sitting upright etc.  12/27/11-  87 yoF never smoker, asthma and history of pneumonia with cough, complicated by past history surgery for thoracic outlet syndrome, CVA, allergic rhinitis, polycythemia. Husband here  PCP Dr Jacelyn Grip After overnight oximetry she qualified for home oxygen during sleep at 2 L per minute/[facility]. They have an electrical generator and her husband is getting a backup generator. She chokes occasionally eating without obvious reflux. We discussed dysphagia and aspiration risk. Speech therapist report describes esophageal dysphagia without aspiration. A copy of this report will be forwarded to Dr. Jacelyn Grip who might consider speech therapy for swallowing training. Dr Marin Olp gave prednisone for polycythemia.  04/20/12- 1 yoF never smoker, asthma and history of pneumonia with cough, complicated by  past history surgery for thoracic outlet syndrome, CVA, allergic  rhinitis, polycythemia. Husband here    PCP Dr Jacelyn Grip  Husband is here  PT STATES INCREASE SOB,WHEEZING,DUE TO SEASON. HAS A DRY HACKY COUGH  She is blaming bad weather for her increased cough. They put a big air cleaner system in the home. She has been staying indoors to avoid pollen. Benzonatate helps her chronic cough. Nebulizer helps her wheezing.  Wears oxygen for sleep. Dr Marin Olp continues to treat her for polycythemia. We again discussed her history of CVA and questionable diagnosis of MS. Swallowing evaluation (August 2012/filed under imaging) describes esophageal dysphagia and recommended reflux precautions but did not see aspiration, reflux or penetration. She coughed throughout the procedure. ++ Consider allergy profile next visit++  10/20/12- 80 yoF never smoker, asthma and history of pneumonia with cough, complicated by past history surgery for thoracic outlet syndrome, CVA, allergic rhinitis, polycythemia. Husband here    PCP Dr Jacelyn Grip  Husband is here FOLLOWS FOR: doing well as long as sticking with "plan of care".  Had flu vaccine. Blames either Spiriva or Qvar for burning feeling on her tongue and problems with her teeth. Stopped both and doing better.  She is sensitive to weather change. Has morning cough with clear or tan of mucus. Evaluated for degenerative disc disease and syncopal episodes.  04/20/13- 36 yoF never smoker, asthma and history of pneumonia with cough, complicated by past history surgery for thoracic outlet syndrome, CVA, allergic rhinitis, polycythemia. Husband here    PCP Dr Jacelyn Grip   Allergy profile was NEG, 4.9 total IgE Bronchitis with productive cough after an afternoon outdoors. She stayed indoors the next day and is better now. Continues oxygen 2-3 L/[facility]. Medications reviewed.these  10/19/13- 62 yoF never smoker, asthma and history of pneumonia with cough, complicated by past history surgery for thoracic outlet syndrome, CVA, GERD, allergic rhinitis,  polycythemia. Husband here    PCP Dr Berline Chough for- c/o wheezing, SOB with exertion, hoarseness, non prod cough. Little phlegm.  Nl PFT 2012 except DLCO 62% Dtr in [place] dying of leukemia. Pt is on O2 2-3L/ [facility].  [facility]/ [facility] referred her to GI/ Dr Paulita Fujita: Ba swallow> stricture and probably mild aspiration  01/21/14-  62 yoF never smoker, asthma and history of pneumonia with cough, complicated by past history surgery for thoracic outlet syndrome, CVA, GERD, allergic rhinitis, polycythemia, depression. Husband here    PCP Dr Jacelyn Grip    ACUTE VISIT: increased cough; getting worse. Losing her voice. Woke up with laryngitis/bronchitis, coughing green. Tussive soreness upper trachea area. No definite fever. Had dysphagia on swallowing evaluation. GI/ Dr Paulita Fujita. Continues oxygen 2.5L/ [facility] Swallowing eval- Dysphagia III   04/19/14-63 yoF never smoker, asthma and history of pneumonia with cough, complicated by past history surgery for thoracic outlet syndrome, CVA, GERD, allergic rhinitis, polycythemia, depression. Husband here    PCP Dr Jacelyn Grip  FOLLOWS FOR: Pt states she has done well with the change in the weather. The new O2 tank she has is working better for her. Using home ionizer air cleaner Seeing GI for GERD  01/18/15-63 yoF never smoker, asthma and history of pneumonia with cough, complicated by past history surgery for thoracic outlet syndrome, CVA, GERD, allergic rhinitis, polycythemia, depression. Husband here     FOLLOWS FOR: Pt states she is doing well overall and continues wearing her O2 2.5L/ [facility]-would like to change to simple go mini tank-will need order for [facility]. CT chest 10/24/14 FINDINGS: CT CHEST FINDINGS- reviewed There  is heterogeneity and sub cm nodularity in the right lobe of the thyroid. No pathologically enlarged mediastinal, hilar or axillary lymph nodes. Surgical clips are seen in the right supraclavicular fossa. Atherosclerotic calcification of the  arterial vasculature. Heart size normal. No pericardial effusion. Chronic appearing volume loss is seen in the right lower lobe, adjacent to a chronically elevated right hemidiaphragm. Dependent atelectasis bilaterally. Lungs are otherwise clear. No pleural fluid. Airway is unremarkable.  Review of Systems- See HPI Constitutional:   No-   weight loss, night sweats, fevers, chills, fatigue, lassitude. HEENT:   frequent  headaches,  Some difficulty swallowing,, sore throat,       No-  sneezing, itching, ear ache, +nasal congestion, post nasal drip,  CV:  No-   chest pain, orthopnea, PND, swelling in lower extremities, anasarca, dizziness, palpitations Resp: + shortness of breath with exertion or at rest. +productive cough              No-  coughing up of blood.              No-  change in color of mucus.  No- wheezing.   Skin: No-   rash or lesions. GI:  No-   heartburn, indigestion, abdominal pain, nausea, vomiting, GU:  MS:  No-   joint pain or swelling.   Neuro- nothing new or unusual  Psych:  No- change in mood or affect. No depression or anxiety.  No memory loss.    Objective:   Physical Exam  General- Alert, Oriented, Affect-appropriate, Distress- none acute,  Overweight. O2 2.5 L/ [facility] Skin- rash-none, lesions- none, excoriation- none Lymphadenopathy- none Head- atraumatic            Eyes- Gross vision intact, PERRLA, conjunctivae clear secretions            Ears- Hearing, canals            Nose- Clear, No- Septal dev, mucus, polyps, erosion, perforation             Throat- Mallampati III , mucosa clear , drainage- none, tonsils- atrophic, +hoarse Neck- flexible , trachea midline, no stridor , thyroid nl, carotid no bruit Chest - symmetrical excursion , unlabored           Heart/CV- RRR , no murmur , no gallop  , no rub, nl s1 s2                           - JVD- none , edema- none, stasis changes- none, varices- none           Lung- clear to P&A, wheeze-none, +cough-  light/ dry cough with deep breath ,                           dullness-none, rub- none, unlabored           Chest wall- +Vascular surgery scar Right Upper Anterior chest Abd- Br/ Gen/ Rectal- Not done, not indicated Extrem- cyanosis- none, clubbing, none, atrophy- none, strength- . +Rolling walker Neuro- + some word searching

## 2015-01-18 NOTE — Patient Instructions (Signed)
Order- DME [facility]  Please evaluate for small portable O2 concentrator, 2.5 L/M     Dx asthma with COPD  Ok to continue present meds  Please call  as needed

## 2015-01-19 LAB — ESTRADIOL, ULTRA SENS: Estradiol, Ultra Sensitive: 18 pg/mL

## 2015-01-21 NOTE — Assessment & Plan Note (Signed)
Plan-we discussed upcoming spring pollen season and available antihistamines

## 2015-01-21 NOTE — Assessment & Plan Note (Signed)
Chronic hypoxic respiratory failure-combination of her chronic asthma with bronchitis and hypoventilation.. She's had some persistent atelectasis on chest x-ray. Plan-DME [facility] to assess for small portable oxygen concentrator as patient requests

## 2015-03-03 ENCOUNTER — Ambulatory Visit (HOSPITAL_BASED_OUTPATIENT_CLINIC_OR_DEPARTMENT_OTHER): Payer: Medicare Other | Admitting: Hematology & Oncology

## 2015-03-03 ENCOUNTER — Other Ambulatory Visit (HOSPITAL_BASED_OUTPATIENT_CLINIC_OR_DEPARTMENT_OTHER): Payer: Medicare Other

## 2015-03-03 ENCOUNTER — Other Ambulatory Visit: Payer: Self-pay | Admitting: *Deleted

## 2015-03-03 ENCOUNTER — Encounter: Payer: Self-pay | Admitting: Hematology & Oncology

## 2015-03-03 VITALS — BP 127/62 | HR 70 | Temp 97.8°F | Resp 16 | Ht 67.0 in | Wt 204.0 lb

## 2015-03-03 DIAGNOSIS — D751 Secondary polycythemia: Secondary | ICD-10-CM | POA: Diagnosis not present

## 2015-03-03 DIAGNOSIS — D510 Vitamin B12 deficiency anemia due to intrinsic factor deficiency: Secondary | ICD-10-CM

## 2015-03-03 DIAGNOSIS — E559 Vitamin D deficiency, unspecified: Secondary | ICD-10-CM

## 2015-03-03 DIAGNOSIS — D45 Polycythemia vera: Secondary | ICD-10-CM

## 2015-03-03 DIAGNOSIS — E611 Iron deficiency: Secondary | ICD-10-CM

## 2015-03-03 LAB — CBC WITH DIFFERENTIAL (CANCER CENTER ONLY)
BASO#: 0 10*3/uL (ref 0.0–0.2)
BASO%: 0.4 % (ref 0.0–2.0)
EOS ABS: 0.1 10*3/uL (ref 0.0–0.5)
EOS%: 2.3 % (ref 0.0–7.0)
HCT: 35.5 % (ref 34.8–46.6)
HGB: 10.8 g/dL — ABNORMAL LOW (ref 11.6–15.9)
LYMPH#: 1.8 10*3/uL (ref 0.9–3.3)
LYMPH%: 32.1 % (ref 14.0–48.0)
MCH: 21.3 pg — AB (ref 26.0–34.0)
MCHC: 30.4 g/dL — ABNORMAL LOW (ref 32.0–36.0)
MCV: 70 fL — AB (ref 81–101)
MONO#: 0.6 10*3/uL (ref 0.1–0.9)
MONO%: 9.9 % (ref 0.0–13.0)
NEUT#: 3.1 10*3/uL (ref 1.5–6.5)
NEUT%: 55.3 % (ref 39.6–80.0)
Platelets: 274 10*3/uL (ref 145–400)
RBC: 5.06 10*6/uL (ref 3.70–5.32)
RDW: 17.4 % — ABNORMAL HIGH (ref 11.1–15.7)
WBC: 5.6 10*3/uL (ref 3.9–10.0)

## 2015-03-03 LAB — COMPREHENSIVE METABOLIC PANEL
ALBUMIN: 4 g/dL (ref 3.5–5.2)
ALK PHOS: 62 U/L (ref 39–117)
ALT: 21 U/L (ref 0–35)
AST: 25 U/L (ref 0–37)
BUN: 13 mg/dL (ref 6–23)
CO2: 25 mEq/L (ref 19–32)
CREATININE: 0.71 mg/dL (ref 0.50–1.10)
Calcium: 8.9 mg/dL (ref 8.4–10.5)
Chloride: 104 mEq/L (ref 96–112)
GLUCOSE: 79 mg/dL (ref 70–99)
POTASSIUM: 4.4 meq/L (ref 3.5–5.3)
Sodium: 138 mEq/L (ref 135–145)
Total Bilirubin: 0.5 mg/dL (ref 0.2–1.2)
Total Protein: 6.5 g/dL (ref 6.0–8.3)

## 2015-03-03 MED ORDER — CLOPIDOGREL BISULFATE 75 MG PO TABS: 75.0000 mg | ORAL_TABLET | Freq: Every day | ORAL | Status: DC

## 2015-03-03 MED ORDER — LORAZEPAM 0.5 MG PO TABS: 0.5000 mg | ORAL_TABLET | Freq: Two times a day (BID) | ORAL | Status: DC

## 2015-03-03 MED ORDER — MECLIZINE HCL 25 MG PO TABS: 25.0000 mg | ORAL_TABLET | Freq: Three times a day (TID) | ORAL | Status: DC | PRN

## 2015-03-03 MED ORDER — PANTOPRAZOLE SODIUM 40 MG PO TBEC: DELAYED_RELEASE_TABLET | ORAL | Status: DC

## 2015-03-03 NOTE — Progress Notes (Signed)
Hematology and Oncology Follow Up Visit  JOELENE BARRIERE [id number] 02-Jun-1951 64 y.o. 03/03/2015   Principle Diagnosis:   Polycythemia vera- JAK2 negative  Current Therapy:    Phlebotomy to maintain hematocrit less than 38%  Plavix 75 mg by mouth daily     Interim History:  Ms.  Battey is back for followup. She is doing okay. She is now taking this I'll of leaf extract. She says this is helping her mouth. She also has been taking a home remedy for her mouth. She has not had any dental surgery. She wants to avoid dental surgery.  Unfortunately, her husband is now sick. He had not another bout of pneumonia. He does have some underlying pulmonary disease.  She's had no neurological issues. She's had no ataxia. There's been no vertigo. She does use a walker to ambulate.  She's had no fever. She's had no change in bowel or bladder habits. Medications:  Current outpatient prescriptions:  .  albuterol (PROVENTIL HFA;VENTOLIN HFA) 108 (90 BASE) MCG/ACT inhaler, Inhale 2 puffs into the lungs every 4 (four) hours as needed for wheezing or shortness of breath., Disp: 3 Inhaler, Rfl: 3 .  albuterol (PROVENTIL) (2.5 MG/3ML) 0.083% nebulizer solution, Take 3 mLs (2.5 mg total) by nebulization every 6 (six) hours as needed for wheezing or shortness of breath., Disp: 360 mL, Rfl: 3 .  Alpha Lipoic Acid 200 MG CAPS, Take by mouth every morning. , Disp: , Rfl:  .  amoxicillin (AMOXIL) 500 MG capsule, Take 500 mg by mouth 2 (two) times daily. , Disp: , Rfl: 1 .  azelastine (ASTELIN) 0.1 % nasal spray, Place 1 spray into both nostrils 2 (two) times daily. Use in each nostril as directed, Disp: 90 mL, Rfl: 3 .  beclomethasone (BECONASE-AQ) 42 MCG/SPRAY nasal spray, Place 2 sprays into both nostrils 2 (two) times daily. Dose is for each nostril., Disp: 75 g, Rfl: 3 .  benzonatate (TESSALON) 100 MG capsule, Take 100 mg by mouth every 6 (six) hours as needed for cough., Disp: , Rfl:  .  buPROPion (WELLBUTRIN XL)  300 MG 24 hr tablet, Take 1 tablet (300 mg total) by mouth daily with breakfast., Disp: 90 tablet, Rfl: 3 .  Calcium Carbonate-Vit D-Min (CALCIUM 1200 PO), Take 2,400 mg by mouth daily. , Disp: , Rfl:  .  co-enzyme Q-10 30 MG capsule, Take 30 mg by mouth daily. , Disp: , Rfl:  .  cyclobenzaprine (FLEXERIL) 10 MG tablet, Take 10 mg by mouth 3 (three) times daily as needed for muscle spasms. , Disp: , Rfl:  .  ESTRACE VAGINAL 0.1 MG/GM vaginal cream, Place 1 Applicatorful vaginally daily. , Disp: , Rfl:  .  ezetimibe (ZETIA) 10 MG tablet, TAKE 1 TABLET BY MOUTH DAILY., Disp: 90 tablet, Rfl: 3 .  fexofenadine (ALLEGRA) 180 MG tablet, Take 1 tablet (180 mg total) by mouth daily as needed for allergies. As needed, Disp: 90 tablet, Rfl: 3 .  Flaxseed, Linseed, (FLAXSEED OIL) 1200 MG CAPS, Take 1 capsule by mouth 1 day or 1 dose. , Disp: , Rfl:  .  Grape Seed Extract 30 MG CAPS, Take 1 capsule by mouth every morning. , Disp: , Rfl:  .  lidocaine (LIDODERM) 5 %, Place 1 patch onto the skin as needed (pain). Remove & Discard patch within 12 hours or as directed by MD, Disp: , Rfl:  .  magnesium gluconate (MAGONATE) 500 MG tablet, Take 500 mg by mouth daily as needed (constipation). , Disp: ,  Rfl:  .  metoprolol succinate (TOPROL-XL) 50 MG 24 hr tablet, Take 1 tablet (50 mg total) by mouth daily with breakfast., Disp: 90 tablet, Rfl: 3 .  Misc Natural Products (TART CHERRY ADVANCED PO), Take 5 mLs by mouth daily. Juice Concentrate- 1tsp daily, Disp: , Rfl:  .  morphine (MS CONTIN) 30 MG 12 hr tablet, Take 15 mg by mouth 2 (two) times daily. , Disp: , Rfl:  .  morphine (MSIR) 15 MG tablet, Take 15 mg by mouth every 4 (four) hours as needed for severe pain., Disp: , Rfl:  .  Multiple Vitamins-Minerals (CENTRUM SILVER) tablet, Take 1 tablet by mouth daily., Disp: , Rfl:  .  Omega-3 Fatty Acids (FISH OIL) 1200 MG CAPS, Take 2,400 mg by mouth 2 (two) times daily., Disp: , Rfl:  .  Polyethyl Glycol-Propyl Glycol  (SYSTANE) 0.4-0.3 % SOLN, Apply to eye., Disp: , Rfl:  .  pravastatin (PRAVACHOL) 40 MG tablet, Take 1 tablet (40 mg total) by mouth at bedtime., Disp: 90 tablet, Rfl: 3 .  promethazine (PHENERGAN) 25 MG tablet, Take 25 mg by mouth every 8 (eight) hours as needed. , Disp: , Rfl: 1 .  Respiratory Therapy Supplies (NEBULIZER) DEVI, Use as directed, Disp: 1 each, Rfl: 0 .  Turmeric Curcumin 500 MG CAPS, Take 1 capsule by mouth daily., Disp: , Rfl:  .  ZOVIRAX 5 %, Apply 1 application topically as needed (flair). , Disp: , Rfl:  .  clopidogrel (PLAVIX) 75 MG tablet, Take 1 tablet (75 mg total) by mouth daily., Disp: 90 tablet, Rfl: 3 .  LORazepam (ATIVAN) 0.5 MG tablet, Take 1 tablet (0.5 mg total) by mouth 2 (two) times daily. Take 2 tabs in morning and 2 tablets at bedtime, Disp: 180 tablet, Rfl: 3 .  meclizine (ANTIVERT) 25 MG tablet, Take 1 tablet (25 mg total) by mouth 3 (three) times daily as needed for dizziness., Disp: 30 tablet, Rfl: 3 .  pantoprazole (PROTONIX) 40 MG tablet, TAKE 1 TABLET (40 MG TOTAL) BY MOUTH DAILY., Disp: 90 tablet, Rfl: 3 .  riboflavin (VITAMIN B-2) 100 MG TABS, Take 200 mg by mouth 2 (two) times daily before a meal., Disp: , Rfl:   Allergies:  Allergies  Allergen Reactions  . Qvar [Beclomethasone]   . Spiriva [Tiotropium Bromide Monohydrate]     rash  . Chlorzoxazone   . Epinephrine     Raises heart rate  . Lamotrigine   . Levofloxacin   . Neurontin [Gabapentin]     arthralgia  . Protriptyline Hcl     Severe constipation  . Ropinirole Hcl     arthralgia  . Tizanidine     panic  . Verapamil   . Zonegran     Mood swings  . Advair Diskus [Fluticasone-Salmeterol] Rash  . Baclofen Rash  . Chlorzoxazone Rash  . Levofloxacin Rash    Past Medical History, Surgical history, Social history, and Family History were reviewed and updated.  Review of Systems: As above  Physical Exam:  height is 5\' 7"  (1.702 m) and weight is 204 lb (92.534 kg). Her oral  temperature is 97.8 F (36.6 C). Her blood pressure is 127/62 and her pulse is 70. Her respiration is 16.   Well-developed and well-nourished white female. Head and neck exam shows Dr. or oral lesions. She has no palpable cervical or supraclavicular lymph nodes. Lungs are clear bilaterally. Cardiac exam regular in rhythm with no murmurs rubs or bruits. Abdomen is soft. She has good  bowel sounds. There is no fluid wave. There is no palpable liver spleen tip. Back exam shows no tenderness over the spine ribs or hips. Extremities shows no clubbing, cyanosis or edema. She has no rashes. Neurological exam is nonfocal.     Lab Results  Component Value Date   WBC 5.6 03/03/2015   HGB 10.8* 03/03/2015   HCT 35.5 03/03/2015   MCV 70* 03/03/2015   PLT 274 03/03/2015     Chemistry      Component Value Date/Time   NA 140 01/13/2015 1216   NA 146* 10/17/2014 1413   K 4.6 01/13/2015 1216   K 4.3 10/17/2014 1413   CL 107 01/13/2015 1216   CL 102 10/17/2014 1413   CO2 28 01/13/2015 1216   CO2 25 10/17/2014 1413   BUN 16 01/13/2015 1216   BUN 14 10/17/2014 1413   CREATININE 0.79 01/13/2015 1216   CREATININE 0.7 10/17/2014 1413      Component Value Date/Time   CALCIUM 9.1 01/13/2015 1216   CALCIUM 9.3 10/17/2014 1413   ALKPHOS 57 01/13/2015 1216   ALKPHOS 54 10/17/2014 1413   AST 23 01/13/2015 1216   AST 34 10/17/2014 1413   ALT 19 01/13/2015 1216   ALT 33 10/17/2014 1413   BILITOT 0.4 01/13/2015 1216   BILITOT 0.70 10/17/2014 1413         Impression and Plan: Ms. Ector is 64 year-old female with polycythemia. Thankfully, she has not had any neurological issues from this for a long time.  For now, we'll plan to get her back in 6 weeks.  She is clearly iron deficient.    I spent about 30 minutes with she and her husband today.     [name], MD 4/1/20165:15 PM

## 2015-03-06 LAB — IRON AND TIBC CHCC
%SAT: 8 % — ABNORMAL LOW (ref 21–57)
Iron: 38 ug/dL — ABNORMAL LOW (ref 41–142)
TIBC: 474 ug/dL — AB (ref 236–444)
UIBC: 436 ug/dL — AB (ref 120–384)

## 2015-03-06 LAB — FERRITIN CHCC: FERRITIN: 7 ng/mL — AB (ref 9–269)

## 2015-03-10 ENCOUNTER — Other Ambulatory Visit

## 2015-03-10 ENCOUNTER — Ambulatory Visit: Admitting: Hematology & Oncology

## 2015-04-14 ENCOUNTER — Other Ambulatory Visit: Payer: Self-pay | Admitting: *Deleted

## 2015-04-17 ENCOUNTER — Other Ambulatory Visit: Payer: Self-pay | Admitting: *Deleted

## 2015-04-17 DIAGNOSIS — D45 Polycythemia vera: Secondary | ICD-10-CM

## 2015-04-17 MED ORDER — MAGIC MOUTHWASH W/LIDOCAINE: 5.0000 mL | Freq: Four times a day (QID) | ORAL | Status: DC

## 2015-04-27 ENCOUNTER — Other Ambulatory Visit (HOSPITAL_BASED_OUTPATIENT_CLINIC_OR_DEPARTMENT_OTHER): Payer: Medicare Other

## 2015-04-27 ENCOUNTER — Ambulatory Visit (HOSPITAL_BASED_OUTPATIENT_CLINIC_OR_DEPARTMENT_OTHER): Payer: Medicare Other | Admitting: Hematology & Oncology

## 2015-04-27 VITALS — BP 119/54 | HR 80 | Temp 98.1°F | Resp 20 | Wt 202.0 lb

## 2015-04-27 DIAGNOSIS — R634 Abnormal weight loss: Secondary | ICD-10-CM

## 2015-04-27 DIAGNOSIS — D751 Secondary polycythemia: Secondary | ICD-10-CM

## 2015-04-27 DIAGNOSIS — D510 Vitamin B12 deficiency anemia due to intrinsic factor deficiency: Secondary | ICD-10-CM

## 2015-04-27 DIAGNOSIS — D45 Polycythemia vera: Secondary | ICD-10-CM

## 2015-04-27 DIAGNOSIS — E559 Vitamin D deficiency, unspecified: Secondary | ICD-10-CM

## 2015-04-27 DIAGNOSIS — R1013 Epigastric pain: Secondary | ICD-10-CM

## 2015-04-27 LAB — CBC WITH DIFFERENTIAL (CANCER CENTER ONLY)
BASO#: 0 10*3/uL (ref 0.0–0.2)
BASO%: 0.5 % (ref 0.0–2.0)
EOS ABS: 0.2 10*3/uL (ref 0.0–0.5)
EOS%: 2.9 % (ref 0.0–7.0)
HCT: 34.4 % — ABNORMAL LOW (ref 34.8–46.6)
HEMOGLOBIN: 10.5 g/dL — AB (ref 11.6–15.9)
LYMPH#: 1.9 10*3/uL (ref 0.9–3.3)
LYMPH%: 29.3 % (ref 14.0–48.0)
MCH: 21.6 pg — AB (ref 26.0–34.0)
MCHC: 30.5 g/dL — ABNORMAL LOW (ref 32.0–36.0)
MCV: 71 fL — ABNORMAL LOW (ref 81–101)
MONO#: 0.5 10*3/uL (ref 0.1–0.9)
MONO%: 8.2 % (ref 0.0–13.0)
NEUT#: 3.8 10*3/uL (ref 1.5–6.5)
NEUT%: 59.1 % (ref 39.6–80.0)
Platelets: 275 10*3/uL (ref 145–400)
RBC: 4.86 10*6/uL (ref 3.70–5.32)
RDW: 17.9 % — AB (ref 11.1–15.7)
WBC: 6.5 10*3/uL (ref 3.9–10.0)

## 2015-04-27 LAB — COMPREHENSIVE METABOLIC PANEL
ALBUMIN: 4.1 g/dL (ref 3.5–5.2)
ALT: 24 U/L (ref 0–35)
AST: 28 U/L (ref 0–37)
Alkaline Phosphatase: 57 U/L (ref 39–117)
BUN: 13 mg/dL (ref 6–23)
CALCIUM: 9.5 mg/dL (ref 8.4–10.5)
CO2: 26 meq/L (ref 19–32)
Chloride: 106 mEq/L (ref 96–112)
Creatinine, Ser: 0.83 mg/dL (ref 0.50–1.10)
Glucose, Bld: 120 mg/dL — ABNORMAL HIGH (ref 70–99)
Potassium: 4.5 mEq/L (ref 3.5–5.3)
Sodium: 140 mEq/L (ref 135–145)
Total Bilirubin: 0.4 mg/dL (ref 0.2–1.2)
Total Protein: 6.5 g/dL (ref 6.0–8.3)

## 2015-04-27 LAB — CHCC SATELLITE - SMEAR

## 2015-04-27 NOTE — Progress Notes (Signed)
Hematology and Oncology Follow Up Visit  Natalie Hendrix [id number] 1950/12/25 64 y.o. 04/27/2015   Principle Diagnosis:   Polycythemia vera- JAK2 negative  Current Therapy:    Phlebotomy to maintain hematocrit less than 38%  Plavix 75 mg by mouth daily     Interim History:  Ms.  Hendrix is back for followup. She looks quite good. She's had no problems since her last saw her. She has not fallen. She did have an episode of bursitis in the left elbow.  She still is taking this oil leaf extract. She thinks this is helping her feel better.  She's had no bleeding. She's had no change in bowel or bladder habits. She's had no fever. She's had no nausea or vomiting.  She has had some upper abdominal pain area and this radiates to the back.. She has not lost any weight. There is a history of pancreatic cancer in the family. As such, we probably should get a scan on her abdomen. The Natalie Hendrix that she had done was back in November.  Overall, her performance status is ECOG 1. Medications:  Current outpatient prescriptions:  .  albuterol (PROVENTIL HFA;VENTOLIN HFA) 108 (90 BASE) MCG/ACT inhaler, Inhale 2 puffs into the lungs every 4 (four) hours as needed for wheezing or shortness of breath., Disp: 3 Inhaler, Rfl: 3 .  albuterol (PROVENTIL) (2.5 MG/3ML) 0.083% nebulizer solution, Take 3 mLs (2.5 mg total) by nebulization every 6 (six) hours as needed for wheezing or shortness of breath., Disp: 360 mL, Rfl: 3 .  Alpha Lipoic Acid 200 MG CAPS, Take by mouth every morning. , Disp: , Rfl:  .  Alum & Mag Hydroxide-Simeth (MAGIC MOUTHWASH W/LIDOCAINE) SOLN, Take 5 mLs by mouth 4 (four) times daily., Disp: 240 mL, Rfl: 3 .  amoxicillin (AMOXIL) 500 MG capsule, Take 500 mg by mouth 2 (two) times daily. , Disp: , Rfl: 1 .  azelastine (ASTELIN) 0.1 % nasal spray, Place 1 spray into both nostrils 2 (two) times daily. Use in each nostril as directed, Disp: 90 mL, Rfl: 3 .  beclomethasone (BECONASE-AQ) 42  MCG/SPRAY nasal spray, Place 2 sprays into both nostrils 2 (two) times daily. Dose is for each nostril., Disp: 75 g, Rfl: 3 .  benzonatate (TESSALON) 100 MG capsule, Take 100 mg by mouth every 6 (six) hours as needed for cough., Disp: , Rfl:  .  buPROPion (WELLBUTRIN XL) 300 MG 24 hr tablet, Take 1 tablet (300 mg total) by mouth daily with breakfast., Disp: 90 tablet, Rfl: 3 .  Calcium Carbonate-Vit D-Min (CALCIUM 1200 PO), Take 2,400 mg by mouth daily. , Disp: , Rfl:  .  clopidogrel (PLAVIX) 75 MG tablet, Take 1 tablet (75 mg total) by mouth daily., Disp: 90 tablet, Rfl: 3 .  co-enzyme Q-10 30 MG capsule, Take 30 mg by mouth daily. , Disp: , Rfl:  .  cyclobenzaprine (FLEXERIL) 10 MG tablet, Take 10 mg by mouth 3 (three) times daily as needed for muscle spasms. , Disp: , Rfl:  .  ESTRACE VAGINAL 0.1 MG/GM vaginal cream, Place 1 Applicatorful vaginally daily. , Disp: , Rfl:  .  ezetimibe (ZETIA) 10 MG tablet, TAKE 1 TABLET BY MOUTH DAILY., Disp: 90 tablet, Rfl: 3 .  fexofenadine (ALLEGRA) 180 MG tablet, Take 1 tablet (180 mg total) by mouth daily as needed for allergies. As needed, Disp: 90 tablet, Rfl: 3 .  Flaxseed, Linseed, (FLAXSEED OIL) 1200 MG CAPS, Take 1 capsule by mouth 1 day or 1 dose. ,  Disp: , Rfl:  .  Grape Seed Extract 30 MG CAPS, Take 1 capsule by mouth every morning. , Disp: , Rfl:  .  lidocaine (LIDODERM) 5 %, Place 1 patch onto the skin as needed (pain). Remove & Discard patch within 12 hours or as directed by MD, Disp: , Rfl:  .  LORazepam (ATIVAN) 0.5 MG tablet, Take 1 tablet (0.5 mg total) by mouth 2 (two) times daily. Take 2 tabs in morning and 2 tablets at bedtime, Disp: 180 tablet, Rfl: 3 .  magnesium gluconate (MAGONATE) 500 MG tablet, Take 500 mg by mouth daily as needed (constipation). , Disp: , Rfl:  .  meclizine (ANTIVERT) 25 MG tablet, Take 1 tablet (25 mg total) by mouth 3 (three) times daily as needed for dizziness., Disp: 30 tablet, Rfl: 3 .  metoprolol succinate  (TOPROL-XL) 50 MG 24 hr tablet, Take 1 tablet (50 mg total) by mouth daily with breakfast., Disp: 90 tablet, Rfl: 3 .  Misc Natural Products (TART CHERRY ADVANCED PO), Take 5 mLs by mouth daily. Juice Concentrate- 1tsp daily, Disp: , Rfl:  .  morphine (MS CONTIN) 30 MG 12 hr tablet, Take 15 mg by mouth 2 (two) times daily. , Disp: , Rfl:  .  morphine (MSIR) 15 MG tablet, Take 15 mg by mouth every 4 (four) hours as needed for severe pain., Disp: , Rfl:  .  Multiple Vitamins-Minerals (CENTRUM SILVER) tablet, Take 1 tablet by mouth daily., Disp: , Rfl:  .  Omega-3 Fatty Acids (FISH OIL) 1200 MG CAPS, Take 2,400 mg by mouth 2 (two) times daily., Disp: , Rfl:  .  pantoprazole (PROTONIX) 40 MG tablet, TAKE 1 TABLET (40 MG TOTAL) BY MOUTH DAILY., Disp: 90 tablet, Rfl: 3 .  Polyethyl Glycol-Propyl Glycol (SYSTANE) 0.4-0.3 % SOLN, Apply to eye., Disp: , Rfl:  .  pravastatin (PRAVACHOL) 40 MG tablet, Take 1 tablet (40 mg total) by mouth at bedtime., Disp: 90 tablet, Rfl: 3 .  promethazine (PHENERGAN) 25 MG tablet, Take 25 mg by mouth every 8 (eight) hours as needed. , Disp: , Rfl: 1 .  Respiratory Therapy Supplies (NEBULIZER) DEVI, Use as directed, Disp: 1 each, Rfl: 0 .  riboflavin (VITAMIN B-2) 100 MG TABS, Take 200 mg by mouth 2 (two) times daily before a meal., Disp: , Rfl:  .  Turmeric Curcumin 500 MG CAPS, Take 1 capsule by mouth daily., Disp: , Rfl:  .  ZOVIRAX 5 %, Apply 1 application topically as needed (flair). , Disp: , Rfl:   Allergies:  Allergies  Allergen Reactions  . Qvar [Beclomethasone]   . Spiriva [Tiotropium Bromide Monohydrate]     rash  . Chlorzoxazone   . Epinephrine     Raises heart rate  . Lamotrigine   . Levofloxacin   . Neurontin [Gabapentin]     arthralgia  . Protriptyline Hcl     Severe constipation  . Ropinirole Hcl     arthralgia  . Tizanidine     panic  . Verapamil   . Zonegran     Mood swings  . Advair Diskus [Fluticasone-Salmeterol] Rash  . Baclofen Rash   . Chlorzoxazone Rash  . Levofloxacin Rash    Past Medical History, Surgical history, Social history, and Family History were reviewed and updated.  Review of Systems: As above  Physical Exam:  weight is 202 lb (91.627 kg). Her oral temperature is 98.1 F (36.7 C). Her blood pressure is 119/54 and her pulse is 80. Her respiration is  61.   Well-developed and well-nourished white female. Head and neck exam shows Dr. or oral lesions. She has no palpable cervical or supraclavicular lymph nodes. Lungs are clear bilaterally. Cardiac exam regular in rhythm with no murmurs rubs or bruits. Abdomen is soft. She has good bowel sounds. There is no fluid wave. There is no palpable liver spleen tip. Back exam shows no tenderness over the spine ribs or hips. Extremities shows no clubbing, cyanosis or edema. She has no rashes. Neurological exam is nonfocal.     Lab Results  Component Value Date   WBC 6.5 04/27/2015   HGB 10.5* 04/27/2015   HCT 34.4* 04/27/2015   MCV 71* 04/27/2015   PLT 275 04/27/2015     Chemistry      Component Value Date/Time   NA 138 03/03/2015 1521   NA 146* 10/17/2014 1413   K 4.4 03/03/2015 1521   K 4.3 10/17/2014 1413   CL 104 03/03/2015 1521   CL 102 10/17/2014 1413   CO2 25 03/03/2015 1521   CO2 25 10/17/2014 1413   BUN 13 03/03/2015 1521   BUN 14 10/17/2014 1413   CREATININE 0.71 03/03/2015 1521   CREATININE 0.7 10/17/2014 1413      Component Value Date/Time   CALCIUM 8.9 03/03/2015 1521   CALCIUM 9.3 10/17/2014 1413   ALKPHOS 62 03/03/2015 1521   ALKPHOS 54 10/17/2014 1413   AST 25 03/03/2015 1521   AST 34 10/17/2014 1413   ALT 21 03/03/2015 1521   ALT 33 10/17/2014 1413   BILITOT 0.5 03/03/2015 1521   BILITOT 0.70 10/17/2014 1413         Impression and Plan: Ms. Depass is 64 year-old female with polycythemia. Thankfully, she has not had any neurological issues from this for a long time.  He will order the CT scan. I will get this set up for  next week.  For now, we'll plan to get her back in 6 weeks.  She is clearly iron deficient.    I spent about 30 minutes with she and her husband today.     [name], MD 5/26/20164:53 PM

## 2015-04-28 LAB — VITAMIN B12: Vitamin B-12: 686 pg/mL (ref 211–911)

## 2015-04-28 LAB — VITAMIN D 25 HYDROXY (VIT D DEFICIENCY, FRACTURES): VIT D 25 HYDROXY: 35 ng/mL (ref 30–100)

## 2015-04-28 LAB — RETICULOCYTES (CHCC)
ABS Retic: 55.1 10*3/uL (ref 19.0–186.0)
RBC.: 5.01 MIL/uL (ref 3.87–5.11)
RETIC CT PCT: 1.1 % (ref 0.4–2.3)

## 2015-04-28 LAB — FERRITIN CHCC: Ferritin: 6 ng/ml — ABNORMAL LOW (ref 9–269)

## 2015-04-28 LAB — IRON AND TIBC CHCC
%SAT: 4 % — AB (ref 21–57)
IRON: 20 ug/dL — AB (ref 41–142)
TIBC: 468 ug/dL — ABNORMAL HIGH (ref 236–444)
UIBC: 448 ug/dL — AB (ref 120–384)

## 2015-05-04 ENCOUNTER — Other Ambulatory Visit: Payer: Medicare Other

## 2015-05-05 ENCOUNTER — Other Ambulatory Visit: Payer: Medicare Other

## 2015-05-18 ENCOUNTER — Other Ambulatory Visit: Payer: Self-pay | Admitting: *Deleted

## 2015-05-18 DIAGNOSIS — D45 Polycythemia vera: Secondary | ICD-10-CM

## 2015-05-18 MED ORDER — NYSTATIN 100000 UNIT/ML MT SUSP: 5.0000 mL | Freq: Four times a day (QID) | OROMUCOSAL | Status: DC

## 2015-05-29 ENCOUNTER — Other Ambulatory Visit: Payer: Medicare Other

## 2015-06-13 ENCOUNTER — Telehealth: Payer: Self-pay | Admitting: Hematology & Oncology

## 2015-06-13 ENCOUNTER — Ambulatory Visit: Payer: Medicare Other | Admitting: Hematology & Oncology

## 2015-06-13 ENCOUNTER — Other Ambulatory Visit: Payer: Medicare Other

## 2015-06-13 NOTE — Telephone Encounter (Signed)
Pt's husband called to say pt put her back out and resched for 8/3

## 2015-06-27 ENCOUNTER — Other Ambulatory Visit: Payer: Medicare Other

## 2015-07-03 ENCOUNTER — Ambulatory Visit
Admission: RE | Admit: 2015-07-03 | Discharge: 2015-07-03 | Disposition: A | Discharge status: Home / Self Care | Payer: Medicare Other | Source: Ambulatory Visit | Attending: Hematology & Oncology | Admitting: Hematology & Oncology

## 2015-07-03 DIAGNOSIS — R634 Abnormal weight loss: Secondary | ICD-10-CM

## 2015-07-03 DIAGNOSIS — R1013 Epigastric pain: Secondary | ICD-10-CM

## 2015-07-03 MED ORDER — IOPAMIDOL (ISOVUE-300) INJECTION 61%
100.0000 mL | Freq: Once | INTRAVENOUS | Status: AC | PRN
  Administered 2015-07-03: 100 mL via INTRAVENOUS

## 2015-07-04 ENCOUNTER — Encounter: Payer: Self-pay | Admitting: *Deleted

## 2015-07-05 ENCOUNTER — Other Ambulatory Visit: Payer: Medicare Other

## 2015-07-05 ENCOUNTER — Ambulatory Visit: Payer: Medicare Other | Admitting: Hematology & Oncology

## 2015-07-13 ENCOUNTER — Encounter: Payer: Self-pay | Admitting: Internal Medicine

## 2015-07-13 ENCOUNTER — Ambulatory Visit (INDEPENDENT_AMBULATORY_CARE_PROVIDER_SITE_OTHER): Payer: Medicare Other | Admitting: Internal Medicine

## 2015-07-13 VITALS — BP 102/64 | HR 76 | Ht 67.0 in | Wt 196.0 lb

## 2015-07-13 DIAGNOSIS — J9610 Chronic respiratory failure, unspecified whether with hypoxia or hypercapnia: Secondary | ICD-10-CM

## 2015-07-13 DIAGNOSIS — J9611 Chronic respiratory failure with hypoxia: Secondary | ICD-10-CM

## 2015-07-13 DIAGNOSIS — J0100 Acute maxillary sinusitis, unspecified: Secondary | ICD-10-CM | POA: Diagnosis not present

## 2015-07-13 MED ORDER — AMOXICILLIN-POT CLAVULANATE 875-125 MG PO TABS: 1.0000 | ORAL_TABLET | Freq: Two times a day (BID) | ORAL | Status: DC

## 2015-07-13 NOTE — Patient Instructions (Signed)
Script printed for augmentin antibiotic  Order- [facility]  Evaluate for small portable O2 2.5 L/M concentrator   Dx chronic respiratory failure with hypoxia

## 2015-07-13 NOTE — Progress Notes (Signed)
Subjective:   07/22/11- 37 yoF here on kind referral by Dr. Ernst [place] because of asthma and history of pneumonia with cough. Husband here. She was a patient of mine in the [facility] at the old practice. She gives a history of asthma and bronchitis. Upper respiratory problems have been more evident since November of 2011. Coughs easily while eating. Coughs each morning bringing up small pellets of green. Wakes nauseated in the morning until she coughs her chest clear. Nebulizer treatments at her doctors for this has seemed very helpful and she would like to have a machine at home. Wears a mask outside and finds she is much bothered by strong odors heat and humidity. She is using Qvar 40 twice daily but blames that for burning in her mouth despite aggressive anti-thrush management. Using Beconase AQ nasal spray, a rescue inhaler once or twice daily and Tessalon pearls.  She feels distinctly better indoors where her husband has installed an extensive air filter system as well as individual room air cleaners in several rooms. She has encasings on bending and they are careful about dust control. Allergy skin testing in the past was positive without allergy vaccine. She has a history of polycythemia vera followed by Dr. Marin Olp with phlebotomy. Polycythemia is planned for cerebrovascular accident which has left her with cognitive and speech difficulties. She also has history of irregular heartbeat on Toprol. Pneumonia in 1978. No history of deep vein thrombosis or pulmonary embolism. Pneumococcal vaccine twice. Flu vaccine last year with blamed recurrent episodes of bronchitis during the winter. Thoracic steal syndrome was addressed with vascular reconstruction on the right in the 1980s. Chest x-ray report from 06/12/2011 described minimal right basilar atelectasis with no acute infiltrate identified, improved from earlier film. Never smoked. Family history a sister with severe allergy problems, and  with COPD, father worked with asbestos exposure and was a heavy smoker   08/23/11- 52 yoF never smoker, asthma and history of pneumonia with cough, complicated by past history surgery for thoracic outlet syndrome, CVA, allergic rhinitis, polycythemia. After last visit August 20, I have some concern that what she was describing was related to upper airway control after her stroke. She comes today for followup of lab work. ONOX-oxygen desaturation equal or less than 89% on room air for 15.4 minutes, less than or equal to 88% for 9.9 minutes, and lowest recorded saturation 82% on 07/29/2011. This could justify home oxygen for sleep. PFT-08/01/2011-normal baseline spirometry with some small airway response to bronchodilator, normal lung volumes, diffusion moderately reduced. FEV1/FVC 0.85, TLC 96%, DLCO 62%. Alpha I antitrypsin assessment: Normal MM. Speech therapy modified barium swallow was performed 08/01/2011 but I do not yet have the therapist report. I have counseled her care with swallowing, careful chewing and swallowing, sitting upright etc.  12/27/11-  20 yoF never smoker, asthma and history of pneumonia with cough, complicated by past history surgery for thoracic outlet syndrome, CVA, allergic rhinitis, polycythemia. Husband here  PCP Dr Jacelyn Grip After overnight oximetry she qualified for home oxygen during sleep at 2 L per minute/[facility]. They have an electrical generator and her husband is getting a backup generator. She chokes occasionally eating without obvious reflux. We discussed dysphagia and aspiration risk. Speech therapist report describes esophageal dysphagia without aspiration. A copy of this report will be forwarded to Dr. Jacelyn Grip who might consider speech therapy for swallowing training. Dr Marin Olp gave prednisone for polycythemia.  04/20/12- 39 yoF never smoker, asthma and history of pneumonia with cough, complicated by  past history surgery for thoracic outlet syndrome, CVA, allergic  rhinitis, polycythemia. Husband here    PCP Dr Jacelyn Grip  Husband is here  PT STATES INCREASE SOB,WHEEZING,DUE TO SEASON. HAS A DRY HACKY COUGH  She is blaming bad weather for her increased cough. They put a big air cleaner system in the home. She has been staying indoors to avoid pollen. Benzonatate helps her chronic cough. Nebulizer helps her wheezing.  Wears oxygen for sleep. Dr Marin Olp continues to treat her for polycythemia. We again discussed her history of CVA and questionable diagnosis of MS. Swallowing evaluation (August 2012/filed under imaging) describes esophageal dysphagia and recommended reflux precautions but did not see aspiration, reflux or penetration. She coughed throughout the procedure. ++ Consider allergy profile next visit++  10/20/12- 44 yoF never smoker, asthma and history of pneumonia with cough, complicated by past history surgery for thoracic outlet syndrome, CVA, allergic rhinitis, polycythemia. Husband here    PCP Dr Jacelyn Grip  Husband is here FOLLOWS FOR: doing well as long as sticking with "plan of care".  Had flu vaccine. Blames either Spiriva or Qvar for burning feeling on her tongue and problems with her teeth. Stopped both and doing better.  She is sensitive to weather change. Has morning cough with clear or tan of mucus. Evaluated for degenerative disc disease and syncopal episodes.  04/20/13- 34 yoF never smoker, asthma and history of pneumonia with cough, complicated by past history surgery for thoracic outlet syndrome, CVA, allergic rhinitis, polycythemia. Husband here    PCP Dr Jacelyn Grip   Allergy profile was NEG, 4.9 total IgE Bronchitis with productive cough after an afternoon outdoors. She stayed indoors the next day and is better now. Continues oxygen 2-3 L/[facility]. Medications reviewed.these  10/19/13- 62 yoF never smoker, asthma and history of pneumonia with cough, complicated by past history surgery for thoracic outlet syndrome, CVA, GERD, allergic rhinitis,  polycythemia. Husband here    PCP Dr Berline Chough for- c/o wheezing, SOB with exertion, hoarseness, non prod cough. Little phlegm.  Nl PFT 2012 except DLCO 62% Dtr in [place] dying of leukemia. Pt is on O2 2-3L/ [facility].  [facility] her to GI/ Dr Paulita Fujita: Ba swallow> stricture and probably mild aspiration  01/21/14-  62 yoF never smoker, asthma and history of pneumonia with cough, complicated by past history surgery for thoracic outlet syndrome, CVA, GERD, allergic rhinitis, polycythemia, depression. Husband here    PCP Dr Jacelyn Grip    ACUTE VISIT: increased cough; getting worse. Losing her voice. Woke up with laryngitis/bronchitis, coughing green. Tussive soreness upper trachea area. No definite fever. Had dysphagia on swallowing evaluation. GI/ Dr Paulita Fujita. Continues oxygen 2.5L/ [facility] Swallowing eval- Dysphagia III   04/19/14-63 yoF never smoker, asthma and history of pneumonia with cough, complicated by past history surgery for thoracic outlet syndrome, CVA, GERD, allergic rhinitis, polycythemia, depression. Husband here    PCP Dr Jacelyn Grip  FOLLOWS FOR: Pt states she has done well with the change in the weather. The new O2 tank she has is working better for her. Using home ionizer air cleaner Seeing GI for GERD  01/18/15-63 yoF never smoker, asthma and history of pneumonia with cough, complicated by past history surgery for thoracic outlet syndrome, CVA, GERD, allergic rhinitis, polycythemia, depression. Husband here     FOLLOWS FOR: Pt states she is doing well overall and continues wearing her O2 2.5L/ [facility]-would like to change to simple go mini tank-will need order for [facility]. CT chest 10/24/14 FINDINGS: CT CHEST FINDINGS- reviewed There  is heterogeneity and sub cm nodularity in the right lobe of the thyroid. No pathologically enlarged mediastinal, hilar or axillary lymph nodes. Surgical clips are seen in the right supraclavicular fossa. Atherosclerotic calcification of the  arterial vasculature. Heart size normal. No pericardial effusion. Chronic appearing volume loss is seen in the right lower lobe, adjacent to a chronically elevated right hemidiaphragm. Dependent atelectasis bilaterally. Lungs are otherwise clear. No pleural fluid. Airway is unremarkable.  07/13/15- 64 yoF never smoker, asthma and history of pneumonia with cough, complicated by past history surgery for thoracic outlet syndrome, CVA, GERD, allergic rhinitis, polycythemia, depression. Husband here   FOLLOWS FOR: Pt states she is having left sinus issues-? inflammation or infection-has left sided pain on face and around tooth-gets better once drains. O2 2.5 L/ [facility] Dentist did x-ray and told her she had sinus infection. She continues using oxygen and feels she needs it. Describes very easy dyspnea on exertion on room air relieved by oxygen  Review of Systems- See HPI Constitutional:   No-   weight loss, night sweats, fevers, chills, fatigue, lassitude. HEENT:   frequent  headaches,  Some difficulty swallowing,, sore throat,       No-  sneezing, itching, ear ache, +nasal congestion, post nasal drip,  CV:  No-   chest pain, orthopnea, PND, swelling in lower extremities, anasarca, dizziness, palpitations Resp: + shortness of breath with exertion or at rest. +productive cough              No-  coughing up of blood.              No-  change in color of mucus.  No- wheezing.   Skin: No-   rash or lesions. GI:  No-   heartburn, indigestion, abdominal pain, nausea, vomiting, GU:  MS:  No-   joint pain or swelling.   Neuro- nothing new or unusual  Psych:  No- change in mood or affect. No depression or anxiety.  No memory loss.    Objective:   Physical Exam  General- Alert, Oriented, Affect-appropriate, Distress- none acute,  Overweight. O2 2.5 L/ [facility] Skin- rash-none, lesions- none, excoriation- none Lymphadenopathy- none Head- atraumatic            Eyes- Gross vision intact, PERRLA,  conjunctivae clear secretions            Ears- Hearing, canals            Nose- Clear, No- Septal dev, mucus, polyps, erosion, perforation             Throat- Mallampati III , mucosa clear , drainage- none, tonsils- atrophic, +hoarse Neck- flexible , trachea midline, no stridor , thyroid nl, carotid no bruit Chest - symmetrical excursion , unlabored           Heart/CV- RRR , no murmur , no gallop  , no rub, nl s1 s2                           - JVD- none , edema- none, stasis changes- none, varices- none           Lung- clear to P&A, wheeze-none, +cough- light/ dry cough with deep breath ,  dullness-none, rub- none, unlabored           Chest wall- +Vascular surgery scar Right Upper Anterior chest Abd- Br/ Gen/ Rectal- Not done, not indicated Extrem- cyanosis- none, clubbing, none, atrophy- none, strength- . +Rolling walker Neuro- + some word searching

## 2015-07-14 DIAGNOSIS — J9611 Chronic respiratory failure with hypoxia: Secondary | ICD-10-CM | POA: Insufficient documentation

## 2015-07-14 DIAGNOSIS — J0100 Acute maxillary sinusitis, unspecified: Secondary | ICD-10-CM | POA: Insufficient documentation

## 2015-07-14 NOTE — Assessment & Plan Note (Signed)
Appropriate to continue home oxygen as discussed.

## 2015-07-14 NOTE — Assessment & Plan Note (Signed)
Discussed interaction between tooth problems and maxillary sinus discomfort/alveolar nerve involvement Plan-Augmentin

## 2015-07-19 ENCOUNTER — Telehealth: Payer: Self-pay | Admitting: *Deleted

## 2015-07-19 NOTE — Telephone Encounter (Signed)
Patient called stating she is having oral surgery August 23 and when to stop plavix.  Dr. Marin Olp states to stop Plavix now and resume 2 days after surgery.  Patient understands this

## 2015-07-20 ENCOUNTER — Ambulatory Visit: Payer: Medicare Other | Admitting: Internal Medicine

## 2015-07-31 ENCOUNTER — Other Ambulatory Visit (HOSPITAL_BASED_OUTPATIENT_CLINIC_OR_DEPARTMENT_OTHER): Payer: Medicare Other

## 2015-07-31 ENCOUNTER — Encounter: Payer: Self-pay | Admitting: Hematology & Oncology

## 2015-07-31 ENCOUNTER — Ambulatory Visit (HOSPITAL_BASED_OUTPATIENT_CLINIC_OR_DEPARTMENT_OTHER): Payer: Medicare Other | Admitting: Hematology & Oncology

## 2015-07-31 VITALS — BP 105/62 | HR 76 | Temp 98.8°F | Resp 16 | Ht 67.0 in

## 2015-07-31 DIAGNOSIS — D45 Polycythemia vera: Secondary | ICD-10-CM

## 2015-07-31 DIAGNOSIS — R1013 Epigastric pain: Secondary | ICD-10-CM

## 2015-07-31 DIAGNOSIS — R634 Abnormal weight loss: Secondary | ICD-10-CM

## 2015-07-31 LAB — CBC WITH DIFFERENTIAL (CANCER CENTER ONLY)
BASO#: 0 10*3/uL (ref 0.0–0.2)
BASO%: 0.7 % (ref 0.0–2.0)
EOS%: 2.9 % (ref 0.0–7.0)
Eosinophils Absolute: 0.1 10*3/uL (ref 0.0–0.5)
HEMATOCRIT: 36 % (ref 34.8–46.6)
HEMOGLOBIN: 11.1 g/dL — AB (ref 11.6–15.9)
LYMPH#: 1.2 10*3/uL (ref 0.9–3.3)
LYMPH%: 27.6 % (ref 14.0–48.0)
MCH: 22 pg — ABNORMAL LOW (ref 26.0–34.0)
MCHC: 30.8 g/dL — AB (ref 32.0–36.0)
MCV: 71 fL — ABNORMAL LOW (ref 81–101)
MONO#: 0.4 10*3/uL (ref 0.1–0.9)
MONO%: 8 % (ref 0.0–13.0)
NEUT%: 60.8 % (ref 39.6–80.0)
NEUTROS ABS: 2.7 10*3/uL (ref 1.5–6.5)
Platelets: 221 10*3/uL (ref 145–400)
RBC: 5.04 10*6/uL (ref 3.70–5.32)
RDW: 17.8 % — ABNORMAL HIGH (ref 11.1–15.7)
WBC: 4.5 10*3/uL (ref 3.9–10.0)

## 2015-07-31 LAB — COMPREHENSIVE METABOLIC PANEL (CC13)
ALT: 17 U/L (ref 0–55)
ANION GAP: 7 meq/L (ref 3–11)
AST: 20 U/L (ref 5–34)
Albumin: 3.7 g/dL (ref 3.5–5.0)
Alkaline Phosphatase: 60 U/L (ref 40–150)
BUN: 13.6 mg/dL (ref 7.0–26.0)
CO2: 29 meq/L (ref 22–29)
Calcium: 9.2 mg/dL (ref 8.4–10.4)
Chloride: 108 mEq/L (ref 98–109)
Creatinine: 0.8 mg/dL (ref 0.6–1.1)
EGFR: 79 mL/min/{1.73_m2} — AB (ref >90)
GLUCOSE: 95 mg/dL (ref 70–140)
POTASSIUM: 4 meq/L (ref 3.5–5.1)
SODIUM: 144 meq/L (ref 136–145)
Total Bilirubin: 0.37 mg/dL (ref 0.20–1.20)
Total Protein: 6.6 g/dL (ref 6.4–8.3)

## 2015-07-31 LAB — CANCER ANTIGEN 19-9: CA 19-9: 14 U/mL (ref <35.0)

## 2015-08-01 NOTE — Progress Notes (Signed)
Hematology and Oncology Follow Up Visit  Natalie Hendrix [id number] Oct 26, 1951 64 y.o. 08/01/2015   Principle Diagnosis:   Polycythemia vera- JAK2 negative  Current Therapy:    Phlebotomy to maintain hematocrit less than 38%  Plavix 75 mg by mouth daily     Interim History:  Ms.  Hendrix is back for followup. She is having a little bit of a hard time right now. She had recent oral surgery. She had some periodontal work done. She is having some discomfort.  We did do a CT scan at we saw her last. This was done on August 1. Thank you, the CT scan did not show any evidence of cirrhosis. There is no splenic issues. There is no diverticulitis.   She has not had any problems with bleeding. She has had no issues falling. She does use a rolling walker.  She is on supplemental oxygen. She has some underlying pulmonary disease.  There's been no rashes. She's had no leg swelling.  She is clearly iron deficient. This has not been a problem.    Overall, her performance status is ECOG 2. Medications:  Current outpatient prescriptions:  .  albuterol (PROVENTIL HFA;VENTOLIN HFA) 108 (90 BASE) MCG/ACT inhaler, Inhale 2 puffs into the lungs every 4 (four) hours as needed for wheezing or shortness of breath., Disp: 3 Inhaler, Rfl: 3 .  albuterol (PROVENTIL) (2.5 MG/3ML) 0.083% nebulizer solution, Take 3 mLs (2.5 mg total) by nebulization every 6 (six) hours as needed for wheezing or shortness of breath., Disp: 360 mL, Rfl: 3 .  Alpha Lipoic Acid 200 MG CAPS, Take by mouth every morning. , Disp: , Rfl:  .  Alum & Mag Hydroxide-Simeth (MAGIC MOUTHWASH W/LIDOCAINE) SOLN, Take 5 mLs by mouth 4 (four) times daily., Disp: 240 mL, Rfl: 3 .  azelastine (ASTELIN) 0.1 % nasal spray, Place 1 spray into both nostrils 2 (two) times daily. Use in each nostril as directed, Disp: 90 mL, Rfl: 3 .  beclomethasone (BECONASE-AQ) 42 MCG/SPRAY nasal spray, Place 2 sprays into both nostrils 2 (two) times daily. Dose is for  each nostril., Disp: 75 g, Rfl: 3 .  benzonatate (TESSALON) 100 MG capsule, Take 100 mg by mouth every 6 (six) hours as needed for cough., Disp: , Rfl:  .  buPROPion (WELLBUTRIN XL) 300 MG 24 hr tablet, Take 1 tablet (300 mg total) by mouth daily with breakfast., Disp: 90 tablet, Rfl: 3 .  Calcium Carbonate-Vit D-Min (CALCIUM 1200 PO), Take 2,400 mg by mouth daily. , Disp: , Rfl:  .  clopidogrel (PLAVIX) 75 MG tablet, Take 1 tablet (75 mg total) by mouth daily., Disp: 90 tablet, Rfl: 3 .  co-enzyme Q-10 30 MG capsule, Take 30 mg by mouth daily. , Disp: , Rfl:  .  cyclobenzaprine (FLEXERIL) 10 MG tablet, Take 10 mg by mouth 3 (three) times daily as needed for muscle spasms. , Disp: , Rfl:  .  ESTRACE VAGINAL 0.1 MG/GM vaginal cream, Place 1 Applicatorful vaginally daily. , Disp: , Rfl:  .  ezetimibe (ZETIA) 10 MG tablet, TAKE 1 TABLET BY MOUTH DAILY., Disp: 90 tablet, Rfl: 3 .  fexofenadine (ALLEGRA) 180 MG tablet, Take 1 tablet (180 mg total) by mouth daily as needed for allergies. As needed, Disp: 90 tablet, Rfl: 3 .  Flaxseed, Linseed, (FLAXSEED OIL) 1200 MG CAPS, Take 1 capsule by mouth 1 day or 1 dose. , Disp: , Rfl:  .  Grape Seed Extract 30 MG CAPS, Take 1 capsule by mouth  every morning. , Disp: , Rfl:  .  lidocaine (LIDODERM) 5 %, Place 1 patch onto the skin as needed (pain). Remove & Discard patch within 12 hours or as directed by MD, Disp: , Rfl:  .  LORazepam (ATIVAN) 0.5 MG tablet, Take 1 tablet (0.5 mg total) by mouth 2 (two) times daily. Take 2 tabs in morning and 2 tablets at bedtime, Disp: 180 tablet, Rfl: 3 .  magnesium gluconate (MAGONATE) 500 MG tablet, Take 500 mg by mouth daily as needed (constipation). , Disp: , Rfl:  .  meclizine (ANTIVERT) 25 MG tablet, Take 1 tablet (25 mg total) by mouth 3 (three) times daily as needed for dizziness., Disp: 30 tablet, Rfl: 3 .  metoprolol succinate (TOPROL-XL) 50 MG 24 hr tablet, Take 1 tablet (50 mg total) by mouth daily with breakfast.,  Disp: 90 tablet, Rfl: 3 .  Misc Natural Products (TART CHERRY ADVANCED PO), Take 5 mLs by mouth daily. Juice Concentrate- 1tsp daily, Disp: , Rfl:  .  morphine (MS CONTIN) 30 MG 12 hr tablet, Take 15 mg by mouth 2 (two) times daily. , Disp: , Rfl:  .  morphine (MSIR) 15 MG tablet, Take 15 mg by mouth every 4 (four) hours as needed for severe pain., Disp: , Rfl:  .  Multiple Vitamins-Minerals (CENTRUM SILVER) tablet, Take 1 tablet by mouth daily., Disp: , Rfl:  .  nystatin (MYCOSTATIN) 100000 UNIT/ML suspension, Take 5 mLs (500,000 Units total) by mouth 4 (four) times daily., Disp: 60 mL, Rfl: 0 .  Omega-3 Fatty Acids (FISH OIL) 1200 MG CAPS, Take 2,400 mg by mouth 2 (two) times daily., Disp: , Rfl:  .  pantoprazole (PROTONIX) 40 MG tablet, TAKE 1 TABLET (40 MG TOTAL) BY MOUTH DAILY., Disp: 90 tablet, Rfl: 3 .  Polyethyl Glycol-Propyl Glycol (SYSTANE) 0.4-0.3 % SOLN, Apply to eye., Disp: , Rfl:  .  pravastatin (PRAVACHOL) 40 MG tablet, Take 1 tablet (40 mg total) by mouth at bedtime., Disp: 90 tablet, Rfl: 3 .  promethazine (PHENERGAN) 25 MG tablet, Take 25 mg by mouth every 8 (eight) hours as needed. , Disp: , Rfl: 1 .  Respiratory Therapy Supplies (NEBULIZER) DEVI, Use as directed, Disp: 1 each, Rfl: 0 .  riboflavin (VITAMIN B-2) 100 MG TABS, Take 200 mg by mouth 2 (two) times daily before a meal., Disp: , Rfl:  .  Turmeric Curcumin 500 MG CAPS, Take 1 capsule by mouth daily., Disp: , Rfl:  .  ZOVIRAX 5 %, Apply 1 application topically as needed (flair). , Disp: , Rfl:   Allergies:  Allergies  Allergen Reactions  . Qvar [Beclomethasone]   . Spiriva [Tiotropium Bromide Monohydrate]     rash  . Chlorzoxazone   . Epinephrine     Raises heart rate  . Lamotrigine   . Levofloxacin   . Neurontin [Gabapentin]     arthralgia  . Protriptyline Hcl     Severe constipation  . Ropinirole Hcl     arthralgia  . Tizanidine     panic  . Verapamil   . Zonegran     Mood swings  . Advair Diskus  [Fluticasone-Salmeterol] Rash  . Baclofen Rash  . Chlorzoxazone Rash  . Levofloxacin Rash    Past Medical History, Surgical history, Social history, and Family History were reviewed and updated.  Review of Systems: As above  Physical Exam:  height is 5\' 7"  (1.702 m). Her oral temperature is 98.8 F (37.1 C). Her blood pressure is 105/62 and  her pulse is 76. Her respiration is 16.   Well-developed and well-nourished white female. Head and neck exam shows Dr. or oral lesions. She has no palpable cervical or supraclavicular lymph nodes. Lungs are clear bilaterally. Cardiac exam regular in rhythm with no murmurs rubs or bruits. Abdomen is soft. She has good bowel sounds. There is no fluid wave. There is no palpable liver spleen tip. Back exam shows no tenderness over the spine ribs or hips. Extremities shows no clubbing, cyanosis or edema. She has no rashes. Neurological exam is nonfocal.     Lab Results  Component Value Date   WBC 4.5 07/31/2015   HGB 11.1* 07/31/2015   HCT 36.0 07/31/2015   MCV 71* 07/31/2015   PLT 221 07/31/2015     Chemistry      Component Value Date/Time   NA 144 07/31/2015 1218   NA 140 04/27/2015 1516   NA 146* 10/17/2014 1413   K 4.0 07/31/2015 1218   K 4.5 04/27/2015 1516   K 4.3 10/17/2014 1413   CL 106 04/27/2015 1516   CL 102 10/17/2014 1413   CO2 29 07/31/2015 1218   CO2 26 04/27/2015 1516   CO2 25 10/17/2014 1413   BUN 13.6 07/31/2015 1218   BUN 13 04/27/2015 1516   BUN 14 10/17/2014 1413   CREATININE 0.8 07/31/2015 1218   CREATININE 0.83 04/27/2015 1516   CREATININE 0.7 10/17/2014 1413      Component Value Date/Time   CALCIUM 9.2 07/31/2015 1218   CALCIUM 9.5 04/27/2015 1516   CALCIUM 9.3 10/17/2014 1413   ALKPHOS 60 07/31/2015 1218   ALKPHOS 57 04/27/2015 1516   ALKPHOS 54 10/17/2014 1413   AST 20 07/31/2015 1218   AST 28 04/27/2015 1516   AST 34 10/17/2014 1413   ALT 17 07/31/2015 1218   ALT 24 04/27/2015 1516   ALT 33  10/17/2014 1413   BILITOT 0.37 07/31/2015 1218   BILITOT 0.4 04/27/2015 1516   BILITOT 0.70 10/17/2014 1413         Impression and Plan: Ms. Garrow is 64 year-old female with polycythemia. Thankfully, she has not had any neurological issues from this for a long time.  I'm reassured that the CAT scan looks okay.  I feel bad that she is having a lot of pain issues with the oral surgery that she had. It sounds like she is going to have more.  I will plan see her back in 2 months.  I spent about 30 minutes with she and her husband today.     [name], MD 8/30/201610:42 AM

## 2015-09-01 ENCOUNTER — Other Ambulatory Visit: Payer: Self-pay | Admitting: *Deleted

## 2015-09-01 DIAGNOSIS — F329 Major depressive disorder, single episode, unspecified: Secondary | ICD-10-CM

## 2015-09-01 DIAGNOSIS — D751 Secondary polycythemia: Secondary | ICD-10-CM

## 2015-09-01 DIAGNOSIS — F32A Depression, unspecified: Secondary | ICD-10-CM

## 2015-09-01 DIAGNOSIS — R42 Dizziness and giddiness: Secondary | ICD-10-CM

## 2015-09-01 MED ORDER — MECLIZINE HCL 25 MG PO TABS: 25.0000 mg | ORAL_TABLET | Freq: Three times a day (TID) | ORAL | Status: DC | PRN

## 2015-09-01 MED ORDER — LORAZEPAM 0.5 MG PO TABS: ORAL_TABLET | ORAL | Status: DC

## 2015-09-01 MED ORDER — BUPROPION HCL ER (XL) 300 MG PO TB24: 300.0000 mg | ORAL_TABLET | Freq: Every day | ORAL | Status: DC

## 2015-09-04 ENCOUNTER — Other Ambulatory Visit (HOSPITAL_COMMUNITY): Payer: Self-pay | Admitting: Obstetrics and Gynecology

## 2015-09-04 DIAGNOSIS — Z1231 Encounter for screening mammogram for malignant neoplasm of breast: Secondary | ICD-10-CM

## 2015-09-26 ENCOUNTER — Encounter: Payer: Self-pay | Admitting: Family Medicine

## 2015-09-26 ENCOUNTER — Ambulatory Visit (INDEPENDENT_AMBULATORY_CARE_PROVIDER_SITE_OTHER): Payer: Medicare Other | Admitting: Family Medicine

## 2015-09-26 VITALS — BP 108/64 | HR 82 | Temp 99.2°F | Resp 18 | Ht 67.0 in | Wt 193.0 lb

## 2015-09-26 DIAGNOSIS — E785 Hyperlipidemia, unspecified: Secondary | ICD-10-CM | POA: Diagnosis not present

## 2015-09-26 LAB — LIPID PANEL
CHOL/HDL RATIO: 2.9 ratio (ref <=5.0)
CHOLESTEROL: 132 mg/dL (ref 125–200)
HDL: 45 mg/dL — ABNORMAL LOW (ref >=46)
LDL Cholesterol: 55 mg/dL (ref <130)
TRIGLYCERIDES: 161 mg/dL — AB (ref <150)
VLDL: 32 mg/dL — AB (ref <30)

## 2015-09-26 MED ORDER — METOPROLOL SUCCINATE ER 50 MG PO TB24: 50.0000 mg | ORAL_TABLET | Freq: Every day | ORAL | Status: DC

## 2015-09-26 NOTE — Progress Notes (Signed)
Subjective:    Patient ID: Natalie Hendrix, female    DOB: December 14, 1950, 64 y.o.   MRN: [id number]  HPI 03/08/14 Patient has a remote history of a brainstem CVA as well as polycythemia vera. She is currently seeing Dr. Marin Olp who is managing her polycythemia with periodic phlebotomy. She is currently on Plavix for secondary prevention of stroke. She previously saw Dr. Erling Cruz. She is currently seeing a neurologist with [facility] and would like a second opinion.  She's currently taking pravastatin as well as zetia hyperlipidemia. Her goal LDL is less than 70 given her history of stroke. I reviewed her recent lab work including a CBC, CMP. It does show hypoalbuminemia suggesting protein calorie malnutrition.  Patient is not eating well due to dysphasia.  She has seen gastroenterology and has been told that this is something she is going to have to learn to manage and cope with.  Otherwise she is doing well she is overdue for a fasting lipid panel.  At that time, my plan was: 1. HLD (hyperlipidemia) Check fasting lipid panel. Goal LDL is less than 70. - Lipid panel  2. CVA (cerebral infarction) Continue Plavix. I will consult neurology for a second opinion at Dr. Tressia Danas previous office. - Ambulatory referral to Neurology   09/26/15 I have not seen the patient in 18 months. She states that since I have last seen her she was diagnosed with another brainstem stroke. However I reviewed Dr. Antonieta Pert last office visit and there is no mention of a recent brainstem stroke. Her hemoglobin is slightly anemic and her hematocrit is 36. Therefore her polycythemia should not cause any kind of neurologic complications at that level. She is no longer seeing a neurologist. She has seen a gastroenterologist who has determined that she has "a paralyzed distal esophagus". However I have no record of this. I would like to obtain these records from her gastroenterologist. Vaccinations are up-to-date except for a flu shot and she  refuses a flu shot today. Her mammogram and gynecologic care are performed annually at her GYN. The last colonoscopy I have on record was in 2006. I have asked the patient to contact her gastroenterologist to determine if she is due for that again. Her blood pressure today is well controlled at 108/64. Her last CBC and CMP are within normal limits. She is due for fasting lipid panel Past Medical History  Diagnosis Date  . GERD (gastroesophageal reflux disease)   . Hyperlipidemia   . Depression   . Allergy   . Dizziness   . Vertigo   . Migraine headache   . Fluttering heart ([place])   . Hypertension   . Allergic rhinitis   . Polycythemia rubra vera ([place])   . Complication of anesthesia     very sensitive to sedatives, B/P drops  . CVA (cerebral vascular accident) ([place])     left side weakness residual, uses mobile chair, cane  . Asthma     uses oxygen 2.5 l/m 24/7, sleeps elevated  . Thoracic outlet syndrome     '81-repair right side  . Fibromyalgia    Current Outpatient Prescriptions on File Prior to Visit  Medication Sig Dispense Refill  . albuterol (PROVENTIL HFA;VENTOLIN HFA) 108 (90 BASE) MCG/ACT inhaler Inhale 2 puffs into the lungs every 4 (four) hours as needed for wheezing or shortness of breath. 3 Inhaler 3  . albuterol (PROVENTIL) (2.5 MG/3ML) 0.083% nebulizer solution Take 3 mLs (2.5 mg total) by nebulization every 6 (six) hours as  needed for wheezing or shortness of breath. 360 mL 3  . Alpha Lipoic Acid 200 MG CAPS Take by mouth every morning.     . Alum & Mag Hydroxide-Simeth (MAGIC MOUTHWASH W/LIDOCAINE) SOLN Take 5 mLs by mouth 4 (four) times daily. 240 mL 3  . benzonatate (TESSALON) 100 MG capsule Take 100 mg by mouth every 6 (six) hours as needed for cough.    Marland Kitchen buPROPion (WELLBUTRIN XL) 300 MG 24 hr tablet Take 1 tablet (300 mg total) by mouth daily with breakfast. 90 tablet 3  . Calcium Carbonate-Vit D-Min (CALCIUM 1200 PO) Take 2,400 mg by mouth daily.     .  clopidogrel (PLAVIX) 75 MG tablet Take 1 tablet (75 mg total) by mouth daily. 90 tablet 3  . co-enzyme Q-10 30 MG capsule Take 30 mg by mouth daily.     . cyclobenzaprine (FLEXERIL) 10 MG tablet Take 10 mg by mouth 3 (three) times daily as needed for muscle spasms.     Adora Fridge VAGINAL 0.1 MG/GM vaginal cream Place 1 Applicatorful vaginally daily.     Marland Kitchen ezetimibe (ZETIA) 10 MG tablet TAKE 1 TABLET BY MOUTH DAILY. 90 tablet 3  . fexofenadine (ALLEGRA) 180 MG tablet Take 1 tablet (180 mg total) by mouth daily as needed for allergies. As needed 90 tablet 3  . Flaxseed, Linseed, (FLAXSEED OIL) 1200 MG CAPS Take 1 capsule by mouth 1 day or 1 dose.     . Grape Seed Extract 30 MG CAPS Take 1 capsule by mouth every morning.     . lidocaine (LIDODERM) 5 % Place 1 patch onto the skin as needed (pain). Remove & Discard patch within 12 hours or as directed by MD    . LORazepam (ATIVAN) 0.5 MG tablet Take 2 tabs in morning and 2 tablets at bedtime. 3 month supply 480 tablet 3  . magnesium gluconate (MAGONATE) 500 MG tablet Take 500 mg by mouth daily as needed (constipation).     . meclizine (ANTIVERT) 25 MG tablet Take 1 tablet (25 mg total) by mouth 3 (three) times daily as needed for dizziness. 3 month supply 270 tablet 3  . Misc Natural Products (TART CHERRY ADVANCED PO) Take 5 mLs by mouth daily. Juice Concentrate- 1tsp daily    . morphine (MS CONTIN) 30 MG 12 hr tablet Take 15 mg by mouth 2 (two) times daily.     Marland Kitchen morphine (MSIR) 15 MG tablet Take 15 mg by mouth every 4 (four) hours as needed for severe pain.    . Multiple Vitamins-Minerals (CENTRUM SILVER) tablet Take 1 tablet by mouth daily.    Marland Kitchen nystatin (MYCOSTATIN) 100000 UNIT/ML suspension Take 5 mLs (500,000 Units total) by mouth 4 (four) times daily. 60 mL 0  . Omega-3 Fatty Acids (FISH OIL) 1200 MG CAPS Take 2,400 mg by mouth 2 (two) times daily.    . pantoprazole (PROTONIX) 40 MG tablet TAKE 1 TABLET (40 MG TOTAL) BY MOUTH DAILY. 90 tablet 3    . Polyethyl Glycol-Propyl Glycol (SYSTANE) 0.4-0.3 % SOLN Apply to eye.    . pravastatin (PRAVACHOL) 40 MG tablet Take 1 tablet (40 mg total) by mouth at bedtime. 90 tablet 3  . promethazine (PHENERGAN) 25 MG tablet Take 25 mg by mouth every 8 (eight) hours as needed.   1  . Respiratory Therapy Supplies (NEBULIZER) DEVI Use as directed 1 each 0  . riboflavin (VITAMIN B-2) 100 MG TABS Take 200 mg by mouth 2 (two) times daily before  a meal.    . Turmeric Curcumin 500 MG CAPS Take 1 capsule by mouth daily.    Marland Kitchen ZOVIRAX 5 % Apply 1 application topically as needed (flair).     Marland Kitchen azelastine (ASTELIN) 0.1 % nasal spray Place 1 spray into both nostrils 2 (two) times daily. Use in each nostril as directed 90 mL 3  . beclomethasone (BECONASE-AQ) 42 MCG/SPRAY nasal spray Place 2 sprays into both nostrils 2 (two) times daily. Dose is for each nostril. 75 g 3   No current facility-administered medications on file prior to visit.   Past Surgical History  Procedure Laterality Date  . Appendectomy    . Resection rib partial    . Gallbladder surgery    . Breast surgery  1979  . Synonectomy    . Dilation and curettage of uterus    . Root resection and revascularization  1980    of Long thoracic artery  . Rhinoplasty    . Cyst removal hand Bilateral     left done 10-13-13(Dr. Gramig)  . Esophagogastroduodenoscopy (egd) with propofol N/A 10/27/2013    Procedure: ESOPHAGOGASTRODUODENOSCOPY (EGD) WITH PROPOFOL;  Surgeon: Arta Silence, MD;  Location: [facility] END[facility];  Service: Endoscopy;  Laterality: N/A;  . Balloon dilation N/A 10/27/2013    Procedure: BALLOON DILATION;  Surgeon: Arta Silence, MD;  Location: [facility] END[facility]COPY;  Service: Endoscopy;  Laterality: N/A;   Allergies  Allergen Reactions  . Qvar [Beclomethasone]   . Spiriva [Tiotropium Bromide Monohydrate]     rash  . Chlorzoxazone   . Epinephrine     Raises heart rate  . Lamotrigine   . Levofloxacin   . Neurontin [Gabapentin]      arthralgia  . Protriptyline Hcl     Severe constipation  . Ropinirole Hcl     arthralgia  . Tizanidine     panic  . Verapamil   . Zonegran     Mood swings  . Advair Diskus [Fluticasone-Salmeterol] Rash  . Baclofen Rash  . Chlorzoxazone Rash  . Levofloxacin Rash   Social History   Social History  . Marital Status: Married    Spouse Name: N/A  . Number of Children: N  . Years of Education: N/A   Occupational History  . RETIRED     occupational hand therapist   Social History Main Topics  . Smoking status: Never Smoker   . Smokeless tobacco: Never Used     Comment: never used tobacco  . Alcohol Use: No  . Drug Use: No  . Sexual Activity: Not on file   Other Topics Concern  . Not on file   Social History Narrative      Review of Systems  All other systems reviewed and are negative.      Objective:   Physical Exam  Constitutional: She is oriented to person, place, and time.  Cardiovascular: Normal rate, regular rhythm and normal heart sounds.   No murmur heard. Pulmonary/Chest: Effort normal and breath sounds normal. No respiratory distress. She has no wheezes.  Abdominal: Soft. Bowel sounds are normal.  Musculoskeletal: She exhibits no edema.  Neurological: She is alert and oriented to person, place, and time. She has normal reflexes. No cranial nerve deficit. She exhibits abnormal muscle tone. Coordination abnormal.  Vitals reviewed.  patient is weak and confined to wheelchair. She has decreased muscle mass in her arms and legs. She also slurred her speech. She also has word finding aphasia.        Assessment & Plan:  HLD (hyperlipidemia) - Plan: Lipid panel  I will check a fasting lipid panel. Given her previous history of stroke, her goal LDL cholesterol is less than 70. I recommended a flu shot but she declined. Her other vaccinations are up-to-date. Her mammogram and gynecologic care performed at her GYN up-to-date. I asked her to get a release of  information so that I can receive her most recent office visits from her gastroenterologist. She may be due for a colonoscopy for colon cancer screening. I see no evidence of a recent brainstem stroke. I wonder if this is confusion about a previous diagnosis or possibly somatization.  I see no medical indication to change her anticoagulation at this time

## 2015-09-27 ENCOUNTER — Ambulatory Visit (HOSPITAL_COMMUNITY): Payer: Medicare Other

## 2015-09-28 ENCOUNTER — Ambulatory Visit (HOSPITAL_COMMUNITY): Payer: Medicare Other

## 2015-09-28 ENCOUNTER — Encounter: Payer: Self-pay | Admitting: Family Medicine

## 2015-10-02 ENCOUNTER — Ambulatory Visit: Payer: Medicare Other | Admitting: Hematology & Oncology

## 2015-10-02 ENCOUNTER — Other Ambulatory Visit: Payer: Medicare Other

## 2015-10-09 ENCOUNTER — Other Ambulatory Visit: Payer: Medicare Other

## 2015-10-09 ENCOUNTER — Ambulatory Visit: Payer: Medicare Other | Admitting: Family

## 2015-10-09 ENCOUNTER — Encounter: Payer: Self-pay | Admitting: Family Medicine

## 2015-10-09 ENCOUNTER — Other Ambulatory Visit: Payer: Self-pay | Admitting: *Deleted

## 2015-10-09 DIAGNOSIS — F32A Depression, unspecified: Secondary | ICD-10-CM

## 2015-10-09 DIAGNOSIS — F329 Major depressive disorder, single episode, unspecified: Secondary | ICD-10-CM

## 2015-10-09 DIAGNOSIS — R42 Dizziness and giddiness: Secondary | ICD-10-CM

## 2015-10-09 DIAGNOSIS — D751 Secondary polycythemia: Secondary | ICD-10-CM

## 2015-10-09 MED ORDER — BUPROPION HCL ER (XL) 300 MG PO TB24: 300.0000 mg | ORAL_TABLET | Freq: Every day | ORAL | Status: DC

## 2015-10-09 MED ORDER — EZETIMIBE 10 MG PO TABS: ORAL_TABLET | ORAL | Status: DC

## 2015-10-09 MED ORDER — LORAZEPAM 0.5 MG PO TABS: ORAL_TABLET | ORAL | Status: DC

## 2015-10-09 NOTE — Telephone Encounter (Signed)
Script sent to pharmacy.

## 2015-10-11 ENCOUNTER — Telehealth: Payer: Self-pay | Admitting: *Deleted

## 2015-10-11 NOTE — Telephone Encounter (Signed)
Received fax from [facility].   Advised that insurance will only cover max of (3) tabs of Ativan per day.   Current prescription noted as follows:  Ativan 0.5mg  (2) tabs PO Q AM and (2) tabs PO Q HS.   Ok to change prescription to Ativan 1mg  PO BID?

## 2015-10-12 ENCOUNTER — Other Ambulatory Visit: Payer: Self-pay | Admitting: Family Medicine

## 2015-10-12 ENCOUNTER — Telehealth: Payer: Self-pay | Admitting: Family Medicine

## 2015-10-12 ENCOUNTER — Ambulatory Visit (HOSPITAL_COMMUNITY): Payer: Medicare Other

## 2015-10-12 ENCOUNTER — Other Ambulatory Visit: Payer: Self-pay | Admitting: *Deleted

## 2015-10-12 DIAGNOSIS — F32A Depression, unspecified: Secondary | ICD-10-CM

## 2015-10-12 DIAGNOSIS — F329 Major depressive disorder, single episode, unspecified: Secondary | ICD-10-CM

## 2015-10-12 DIAGNOSIS — R42 Dizziness and giddiness: Secondary | ICD-10-CM

## 2015-10-12 DIAGNOSIS — D751 Secondary polycythemia: Secondary | ICD-10-CM

## 2015-10-12 MED ORDER — EZETIMIBE 10 MG PO TABS
ORAL_TABLET | ORAL | Status: DC
Start: 2015-10-12 — End: 2016-09-27

## 2015-10-12 MED ORDER — LORAZEPAM 1 MG PO TABS: 1.0000 mg | ORAL_TABLET | Freq: Two times a day (BID) | ORAL | Status: DC

## 2015-10-12 MED ORDER — BUPROPION HCL ER (XL) 300 MG PO TB24: 300.0000 mg | ORAL_TABLET | Freq: Every day | ORAL | Status: DC

## 2015-10-12 MED ORDER — EZETIMIBE 10 MG PO TABS: ORAL_TABLET | ORAL | Status: DC

## 2015-10-12 NOTE — Telephone Encounter (Signed)
Noted and pharm will have to fax [place] a form for PA and will send in at that time

## 2015-10-12 NOTE — Telephone Encounter (Signed)
ok 

## 2015-10-12 NOTE — Telephone Encounter (Signed)
Disregard previous message - Rx is awaiting approval and signature from MD then will be faxed to pharm.

## 2015-10-12 NOTE — Telephone Encounter (Signed)
Medication called to pharmacy.  Also advised pharmacy to D/C order for Ativan 0.5mg .

## 2015-10-12 NOTE — Telephone Encounter (Signed)
Prescription printed with wrong prescriber.   Prescription re-printed with correct MD.

## 2015-10-12 NOTE — Telephone Encounter (Addendum)
Pt's husband Marguerite Olea called and said that the pharmacy had refilled her Wellbutrin but not the Lorazepam. Per Marguerite Olea, the pharmacy states that they need a "PA form" from the doctor stating why the patient needs the medication. Please call pt @ [phone]

## 2015-10-18 ENCOUNTER — Ambulatory Visit (HOSPITAL_COMMUNITY)
Admission: RE | Admit: 2015-10-18 | Discharge: 2015-10-18 | Disposition: A | Discharge status: Home / Self Care | Payer: Medicare Other | Source: Ambulatory Visit | Attending: Obstetrics and Gynecology | Admitting: Obstetrics and Gynecology

## 2015-10-18 DIAGNOSIS — Z1231 Encounter for screening mammogram for malignant neoplasm of breast: Secondary | ICD-10-CM | POA: Insufficient documentation

## 2015-10-20 ENCOUNTER — Other Ambulatory Visit (HOSPITAL_BASED_OUTPATIENT_CLINIC_OR_DEPARTMENT_OTHER): Payer: Medicare Other

## 2015-10-20 ENCOUNTER — Ambulatory Visit (HOSPITAL_BASED_OUTPATIENT_CLINIC_OR_DEPARTMENT_OTHER): Payer: Medicare Other | Admitting: Family

## 2015-10-20 ENCOUNTER — Encounter: Payer: Self-pay | Admitting: Family

## 2015-10-20 VITALS — BP 111/58 | HR 87 | Temp 98.4°F | Resp 16 | Ht 67.0 in | Wt 193.0 lb

## 2015-10-20 DIAGNOSIS — D45 Polycythemia vera: Secondary | ICD-10-CM | POA: Diagnosis not present

## 2015-10-20 LAB — CBC WITH DIFFERENTIAL (CANCER CENTER ONLY)
BASO#: 0 10*3/uL (ref 0.0–0.2)
BASO%: 0.2 % (ref 0.0–2.0)
EOS%: 2.2 % (ref 0.0–7.0)
Eosinophils Absolute: 0.1 10*3/uL (ref 0.0–0.5)
HEMATOCRIT: 34.3 % — AB (ref 34.8–46.6)
HEMOGLOBIN: 10.5 g/dL — AB (ref 11.6–15.9)
LYMPH#: 1 10*3/uL (ref 0.9–3.3)
LYMPH%: 17.6 % (ref 14.0–48.0)
MCH: 21.6 pg — ABNORMAL LOW (ref 26.0–34.0)
MCHC: 30.6 g/dL — AB (ref 32.0–36.0)
MCV: 70 fL — ABNORMAL LOW (ref 81–101)
MONO#: 0.5 10*3/uL (ref 0.1–0.9)
MONO%: 9.5 % (ref 0.0–13.0)
NEUT%: 70.5 % (ref 39.6–80.0)
NEUTROS ABS: 3.8 10*3/uL (ref 1.5–6.5)
Platelets: 232 10*3/uL (ref 145–400)
RBC: 4.87 10*6/uL (ref 3.70–5.32)
RDW: 17.4 % — ABNORMAL HIGH (ref 11.1–15.7)
WBC: 5.5 10*3/uL (ref 3.9–10.0)

## 2015-10-20 LAB — COMPREHENSIVE METABOLIC PANEL (CC13)
ALBUMIN: 3.5 g/dL (ref 3.5–5.0)
ALK PHOS: 63 U/L (ref 40–150)
ALT: 15 U/L (ref 0–55)
AST: 21 U/L (ref 5–34)
Anion Gap: 6 mEq/L (ref 3–11)
BUN: 12.3 mg/dL (ref 7.0–26.0)
CALCIUM: 9.2 mg/dL (ref 8.4–10.4)
CO2: 26 mEq/L (ref 22–29)
CREATININE: 0.8 mg/dL (ref 0.6–1.1)
Chloride: 108 mEq/L (ref 98–109)
EGFR: 84 mL/min/{1.73_m2} — ABNORMAL LOW (ref >90)
GLUCOSE: 102 mg/dL (ref 70–140)
POTASSIUM: 4.5 meq/L (ref 3.5–5.1)
SODIUM: 140 meq/L (ref 136–145)
TOTAL PROTEIN: 6.3 g/dL — AB (ref 6.4–8.3)
Total Bilirubin: 0.33 mg/dL (ref 0.20–1.20)

## 2015-10-20 NOTE — Progress Notes (Signed)
Hematology and Oncology Follow Up Visit  Natalie Hendrix [id number] 1951-05-31 64 y.o. 10/20/2015   Principle Diagnosis:  Polycythemia vera- JAK2 negative  Current Therapy:   Phlebotomy to maintain hematocrit less than 38% Plavix 75 mg by mouth daily    Interim History:  [name] is here today for a follow-up. She is doing fairly well. Her husband has been quite sick for the last year with a respiratory infection and is allergic to many of the antibiotics he has been placed on. This has put a lot of stress on her caring for him. He has an appointment with ID at the end of the month and hopefully they will be able to help him.  Her Hct today is 34.3 so she will not need phlebotomized.  Unfortunately her main issues involve needing a new PCP and neurologist. She has had issues with offices not accepting her insurance. She has a PCP lined up for in December but still needs neuro.  She had her mammogram on Wednesday and this was negative.  She is still having 2 migraines a week. This is an ongoing issue for her. She refers to them as "smoldering" migraines.  Her last CT in August was negative for malignancy.  She is doing well ambulating with her walker. She now has a lightweight O2 tank she uses as needed that allows her to get out and walk and work in the yard like she likes.  She does wear 2L of O2 at night.  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  She is eating well and staying hydrated. Her weight is unchanged.  She has had no fever, chills, n/v, cough, rash, chest pain, palpitations, abdominal pain or changes in her bowel habits.  No episodes of bleeding or bruising.  She has occasional episodes of dizziness due to vertigo. She has had no syncopal episodes of falls.  She has arthritis in joints and this does cause her discomfort. She has no swelling or tenderness in her extremities. She does have neuropathy in her hands and feet  that she describes as mild and unchanged.  Medications:    Medication List       This list is accurate as of: 10/20/15  1:27 PM.  Always use your most recent med list.               albuterol 108 (90 BASE) MCG/ACT inhaler  Commonly known as:  PROVENTIL HFA;VENTOLIN HFA  Inhale 2 puffs into the lungs every 4 (four) hours as needed for wheezing or shortness of breath.     albuterol (2.5 MG/3ML) 0.083% nebulizer solution  Commonly known as:  PROVENTIL  Take 3 mLs (2.5 mg total) by nebulization every 6 (six) hours as needed for wheezing or shortness of breath.     Alpha Lipoic Acid 200 MG Caps  Take by mouth every morning.     azelastine 0.1 % nasal spray  Commonly known as:  ASTELIN  Place 1 spray into both nostrils 2 (two) times daily. Use in each nostril as directed     beclomethasone 42 MCG/SPRAY nasal spray  Commonly known as:  BECONASE-AQ  Place 2 sprays into both nostrils 2 (two) times daily. Dose is for each nostril.     benzonatate 100 MG capsule  Commonly known as:  TESSALON  Take 100 mg by mouth every 6 (six) hours as needed for cough.     buPROPion 300 MG  24 hr tablet  Commonly known as:  WELLBUTRIN XL  Take 1 tablet (300 mg total) by mouth daily with breakfast.     CALCIUM 1200 PO  Take 2,400 mg by mouth daily.     CENTRUM SILVER tablet  Take 1 tablet by mouth daily.     clopidogrel 75 MG tablet  Commonly known as:  PLAVIX  Take 1 tablet (75 mg total) by mouth daily.     co-enzyme Q-10 30 MG capsule  Take 30 mg by mouth daily.     cyclobenzaprine 10 MG tablet  Commonly known as:  FLEXERIL  Take 10 mg by mouth 3 (three) times daily as needed for muscle spasms.     ESTRACE VAGINAL 0.1 MG/GM vaginal cream  Generic drug:  estradiol  Place 1 Applicatorful vaginally daily.     ezetimibe 10 MG tablet  Commonly known as:  ZETIA  TAKE 1 TABLET BY MOUTH DAILY.     fexofenadine 180 MG tablet  Commonly known as:  ALLEGRA  Take 1 tablet (180 mg total) by  mouth daily as needed for allergies. As needed     Fish Oil 1200 MG Caps  Take 2,400 mg by mouth 2 (two) times daily.     Flaxseed Oil 1200 MG Caps  Take 1 capsule by mouth 1 day or 1 dose.     FLECTOR 1.3 % Ptch  Generic drug:  diclofenac  Place 1 patch onto the skin 2 (two) times daily.     Grape Seed Extract 30 MG Caps  Take 1 capsule by mouth every morning.     lidocaine 5 %  Commonly known as:  LIDODERM  Place 1 patch onto the skin as needed (pain). Remove & Discard patch within 12 hours or as directed by MD     LORazepam 1 MG tablet  Commonly known as:  ATIVAN  Take 1 tablet (1 mg total) by mouth 2 (two) times daily.     magic mouthwash w/lidocaine Soln  Take 5 mLs by mouth 4 (four) times daily.     magnesium gluconate 500 MG tablet  Commonly known as:  MAGONATE  Take 500 mg by mouth daily as needed (constipation).     meclizine 25 MG tablet  Commonly known as:  ANTIVERT  Take 1 tablet (25 mg total) by mouth 3 (three) times daily as needed for dizziness. 3 month supply     metoprolol succinate 50 MG 24 hr tablet  Commonly known as:  TOPROL-XL  Take 1 tablet (50 mg total) by mouth daily with breakfast.     morphine 15 MG tablet  Commonly known as:  MSIR  Take 15 mg by mouth every 4 (four) hours as needed for severe pain.     morphine 30 MG 12 hr tablet  Commonly known as:  MS CONTIN  Take 15 mg by mouth 2 (two) times daily.     Nebulizer Devi  Use as directed     nystatin 100000 UNIT/ML suspension  Commonly known as:  MYCOSTATIN  Take 5 mLs (500,000 Units total) by mouth 4 (four) times daily.     OXYGEN  Inhale 2.5 L/hr into the lungs continuous.     pantoprazole 40 MG tablet  Commonly known as:  PROTONIX  TAKE 1 TABLET (40 MG TOTAL) BY MOUTH DAILY.     pravastatin 40 MG tablet  Commonly known as:  PRAVACHOL  Take 1 tablet (40 mg total) by mouth at bedtime.  promethazine 25 MG tablet  Commonly known as:  PHENERGAN  Take 25 mg by mouth every 8  (eight) hours as needed.     riboflavin 100 MG Tabs tablet  Commonly known as:  VITAMIN B-2  Take 200 mg by mouth 2 (two) times daily before a meal.     SYSTANE 0.4-0.3 % Soln  Generic drug:  Polyethyl Glycol-Propyl Glycol  Apply to eye.     TART CHERRY ADVANCED PO  Take 5 mLs by mouth daily. Juice Concentrate- 1tsp daily     Turmeric Curcumin 500 MG Caps  Take 1 capsule by mouth daily.     ZOVIRAX 5 %  Generic drug:  acyclovir ointment  Apply 1 application topically as needed (flair).        Allergies:  Allergies  Allergen Reactions  . Qvar [Beclomethasone]   . Spiriva [Tiotropium Bromide Monohydrate]     rash  . Chlorzoxazone   . Epinephrine     Raises heart rate  . Lamotrigine   . Levofloxacin   . Neurontin [Gabapentin]     arthralgia  . Protriptyline Hcl     Severe constipation  . Ropinirole Hcl     arthralgia  . Tizanidine     panic  . Verapamil   . Zonegran     Mood swings  . Advair Diskus [Fluticasone-Salmeterol] Rash  . Baclofen Rash  . Chlorzoxazone Rash  . Levofloxacin Rash    Past Medical History, Surgical history, Social history, and Family History were reviewed and updated.  Review of Systems: All other 10 point review of systems is negative.   Physical Exam:  vitals were not taken for this visit.  Wt Readings from Last 3 Encounters:  09/26/15 193 lb (87.544 kg)  07/13/15 196 lb (88.905 kg)  04/27/15 202 lb (91.627 kg)    Ocular: Sclerae unicteric, pupils equal, round and reactive to light Ear-nose-throat: Oropharynx clear, dentition fair Lymphatic: No cervical supraclavicular or axillary adenopathy Lungs no rales or rhonchi, good excursion bilaterally Heart regular rate and rhythm, no murmur appreciated Abd soft, nontender, positive bowel sounds MSK no focal spinal tenderness, no joint edema Neuro: non-focal, well-oriented, appropriate affect Breasts: Deferred  Lab Results  Component Value Date   WBC 4.5 07/31/2015   HGB  11.1* 07/31/2015   HCT 36.0 07/31/2015   MCV 71* 07/31/2015   PLT 221 07/31/2015   Lab Results  Component Value Date   FERRITIN 6* 04/27/2015   IRON 20* 04/27/2015   TIBC 468* 04/27/2015   UIBC 448* 04/27/2015   IRONPCTSAT 4* 04/27/2015   Lab Results  Component Value Date   RETICCTPCT 1.1 04/27/2015   RBC 5.04 07/31/2015   RETICCTABS 55.1 04/27/2015   No results found for: KPAFRELGTCHN, LAMBDASER, KAPLAMBRATIO No results found for: IGGSERUM, IGA, IGMSERUM No results found for: Odetta Pink, SPEI   Chemistry      Component Value Date/Time   NA 144 07/31/2015 1218   NA 140 04/27/2015 1516   NA 146* 10/17/2014 1413   K 4.0 07/31/2015 1218   K 4.5 04/27/2015 1516   K 4.3 10/17/2014 1413   CL 106 04/27/2015 1516   CL 102 10/17/2014 1413   CO2 29 07/31/2015 1218   CO2 26 04/27/2015 1516   CO2 25 10/17/2014 1413   BUN 13.6 07/31/2015 1218   BUN 13 04/27/2015 1516   BUN 14 10/17/2014 1413   CREATININE 0.8 07/31/2015 1218   CREATININE 0.83 04/27/2015 1516  CREATININE 0.7 10/17/2014 1413      Component Value Date/Time   CALCIUM 9.2 07/31/2015 1218   CALCIUM 9.5 04/27/2015 1516   CALCIUM 9.3 10/17/2014 1413   ALKPHOS 60 07/31/2015 1218   ALKPHOS 57 04/27/2015 1516   ALKPHOS 54 10/17/2014 1413   AST 20 07/31/2015 1218   AST 28 04/27/2015 1516   AST 34 10/17/2014 1413   ALT 17 07/31/2015 1218   ALT 24 04/27/2015 1516   ALT 33 10/17/2014 1413   BILITOT 0.37 07/31/2015 1218   BILITOT 0.4 04/27/2015 1516   BILITOT 0.70 10/17/2014 1413     Impression and Plan: Ms. Cobleigh is 64 yo white female with polycythemia. At this time she is getting along quite nicely. She is asymptomatic and staying busy taking care of her husband who has been ill with a respiratory infection.  Her Hct today is 34.3 so she will not need a phlebotomy at this time.  She will continue on Plavix 75 mg daily.  We will plan to see her back in 2  months for labs and follow-up.  She will contact us with any questions or concerns. We can certainly see her sooner if need be.   Eliezer Bottom, NP 11/18/20161:27 PM

## 2015-10-23 LAB — FERRITIN CHCC: FERRITIN: 5 ng/mL — AB (ref 9–269)

## 2015-10-23 LAB — IRON AND TIBC CHCC
%SAT: 6 % — AB (ref 21–57)
IRON: 28 ug/dL — AB (ref 41–142)
TIBC: 440 ug/dL (ref 236–444)
UIBC: 412 ug/dL — ABNORMAL HIGH (ref 120–384)

## 2015-11-13 ENCOUNTER — Ambulatory Visit (INDEPENDENT_AMBULATORY_CARE_PROVIDER_SITE_OTHER): Payer: Medicare Other | Admitting: Internal Medicine

## 2015-11-13 ENCOUNTER — Encounter: Payer: Self-pay | Admitting: Internal Medicine

## 2015-11-13 VITALS — BP 110/64 | HR 74 | Wt 189.0 lb

## 2015-11-13 DIAGNOSIS — J45998 Other asthma: Secondary | ICD-10-CM

## 2015-11-13 DIAGNOSIS — J9611 Chronic respiratory failure with hypoxia: Secondary | ICD-10-CM | POA: Diagnosis not present

## 2015-11-13 NOTE — Assessment & Plan Note (Signed)
Limited exposure to other people now. So far has avoided significant viral illness. Discussed symptom management.

## 2015-11-13 NOTE — Patient Instructions (Signed)
Ok to continue present meds ° °Please call if we can help °

## 2015-11-13 NOTE — Progress Notes (Signed)
Subjective:   07/22/11- 37 yoF here on kind referral by Dr. Ernst [place] because of asthma and history of pneumonia with cough. Husband here. She was a patient of mine in the [facility] at the old practice. She gives a history of asthma and bronchitis. Upper respiratory problems have been more evident since November of 2011. Coughs easily while eating. Coughs each morning bringing up small pellets of green. Wakes nauseated in the morning until she coughs her chest clear. Nebulizer treatments at her doctors for this has seemed very helpful and she would like to have a machine at home. Wears a mask outside and finds she is much bothered by strong odors heat and humidity. She is using Qvar 40 twice daily but blames that for burning in her mouth despite aggressive anti-thrush management. Using Beconase AQ nasal spray, a rescue inhaler once or twice daily and Tessalon pearls.  She feels distinctly better indoors where her husband has installed an extensive air filter system as well as individual room air cleaners in several rooms. She has encasings on bending and they are careful about dust control. Allergy skin testing in the past was positive without allergy vaccine. She has a history of polycythemia vera followed by Dr. Marin Olp with phlebotomy. Polycythemia is planned for cerebrovascular accident which has left her with cognitive and speech difficulties. She also has history of irregular heartbeat on Toprol. Pneumonia in 1978. No history of deep vein thrombosis or pulmonary embolism. Pneumococcal vaccine twice. Flu vaccine last year with blamed recurrent episodes of bronchitis during the winter. Thoracic steal syndrome was addressed with vascular reconstruction on the right in the 1980s. Chest x-ray report from 06/12/2011 described minimal right basilar atelectasis with no acute infiltrate identified, improved from earlier film. Never smoked. Family history a sister with severe allergy problems, and  with COPD, father worked with asbestos exposure and was a heavy smoker   08/23/11- 52 yoF never smoker, asthma and history of pneumonia with cough, complicated by past history surgery for thoracic outlet syndrome, CVA, allergic rhinitis, polycythemia. After last visit August 20, I have some concern that what she was describing was related to upper airway control after her stroke. She comes today for followup of lab work. ONOX-oxygen desaturation equal or less than 89% on room air for 15.4 minutes, less than or equal to 88% for 9.9 minutes, and lowest recorded saturation 82% on 07/29/2011. This could justify home oxygen for sleep. PFT-08/01/2011-normal baseline spirometry with some small airway response to bronchodilator, normal lung volumes, diffusion moderately reduced. FEV1/FVC 0.85, TLC 96%, DLCO 62%. Alpha I antitrypsin assessment: Normal MM. Speech therapy modified barium swallow was performed 08/01/2011 but I do not yet have the therapist report. I have counseled her care with swallowing, careful chewing and swallowing, sitting upright etc.  12/27/11-  20 yoF never smoker, asthma and history of pneumonia with cough, complicated by past history surgery for thoracic outlet syndrome, CVA, allergic rhinitis, polycythemia. Husband here  PCP Dr Jacelyn Grip After overnight oximetry she qualified for home oxygen during sleep at 2 L per minute/[facility]. They have an electrical generator and her husband is getting a backup generator. She chokes occasionally eating without obvious reflux. We discussed dysphagia and aspiration risk. Speech therapist report describes esophageal dysphagia without aspiration. A copy of this report will be forwarded to Dr. Jacelyn Grip who might consider speech therapy for swallowing training. Dr Marin Olp gave prednisone for polycythemia.  04/20/12- 39 yoF never smoker, asthma and history of pneumonia with cough, complicated by  past history surgery for thoracic outlet syndrome, CVA, allergic  rhinitis, polycythemia. Husband here    PCP Dr Jacelyn Grip  Husband is here  PT STATES INCREASE SOB,WHEEZING,DUE TO SEASON. HAS A DRY HACKY COUGH  She is blaming bad weather for her increased cough. They put a big air cleaner system in the home. She has been staying indoors to avoid pollen. Benzonatate helps her chronic cough. Nebulizer helps her wheezing.  Wears oxygen for sleep. Dr Marin Olp continues to treat her for polycythemia. We again discussed her history of CVA and questionable diagnosis of MS. Swallowing evaluation (August 2012/filed under imaging) describes esophageal dysphagia and recommended reflux precautions but did not see aspiration, reflux or penetration. She coughed throughout the procedure. ++ Consider allergy profile next visit++  10/20/12- 80 yoF never smoker, asthma and history of pneumonia with cough, complicated by past history surgery for thoracic outlet syndrome, CVA, allergic rhinitis, polycythemia. Husband here    PCP Dr Jacelyn Grip  Husband is here FOLLOWS FOR: doing well as long as sticking with "plan of care".  Had flu vaccine. Blames either Spiriva or Qvar for burning feeling on her tongue and problems with her teeth. Stopped both and doing better.  She is sensitive to weather change. Has morning cough with clear or tan of mucus. Evaluated for degenerative disc disease and syncopal episodes.  04/20/13- 36 yoF never smoker, asthma and history of pneumonia with cough, complicated by past history surgery for thoracic outlet syndrome, CVA, allergic rhinitis, polycythemia. Husband here    PCP Dr Jacelyn Grip   Allergy profile was NEG, 4.9 total IgE Bronchitis with productive cough after an afternoon outdoors. She stayed indoors the next day and is better now. Continues oxygen 2-3 L/[facility]. Medications reviewed.these  10/19/13- 62 yoF never smoker, asthma and history of pneumonia with cough, complicated by past history surgery for thoracic outlet syndrome, CVA, GERD, allergic rhinitis,  polycythemia. Husband here    PCP Dr Berline Chough for- c/o wheezing, SOB with exertion, hoarseness, non prod cough. Little phlegm.  Nl PFT 2012 except DLCO 62% Dtr in [place] dying of leukemia. Pt is on O2 2-3L/ [facility].  [facility]/ [facility] referred her to GI/ Dr Paulita Fujita: Ba swallow> stricture and probably mild aspiration  01/21/14-  62 yoF never smoker, asthma and history of pneumonia with cough, complicated by past history surgery for thoracic outlet syndrome, CVA, GERD, allergic rhinitis, polycythemia, depression. Husband here    PCP Dr Jacelyn Grip    ACUTE VISIT: increased cough; getting worse. Losing her voice. Woke up with laryngitis/bronchitis, coughing green. Tussive soreness upper trachea area. No definite fever. Had dysphagia on swallowing evaluation. GI/ Dr Paulita Fujita. Continues oxygen 2.5L/ [facility] Swallowing eval- Dysphagia III   04/19/14-63 yoF never smoker, asthma and history of pneumonia with cough, complicated by past history surgery for thoracic outlet syndrome, CVA, GERD, allergic rhinitis, polycythemia, depression. Husband here    PCP Dr Jacelyn Grip  FOLLOWS FOR: Pt states she has done well with the change in the weather. The new O2 tank she has is working better for her. Using home ionizer air cleaner Seeing GI for GERD  01/18/15-63 yoF never smoker, asthma and history of pneumonia with cough, complicated by past history surgery for thoracic outlet syndrome, CVA, GERD, allergic rhinitis, polycythemia, depression. Husband here     FOLLOWS FOR: Pt states she is doing well overall and continues wearing her O2 2.5L/ [facility]-would like to change to simple go mini tank-will need order for [facility]. CT chest 10/24/14 FINDINGS: CT CHEST FINDINGS- reviewed There  is heterogeneity and sub cm nodularity in the right lobe of the thyroid. No pathologically enlarged mediastinal, hilar or axillary lymph nodes. Surgical clips are seen in the right supraclavicular fossa. Atherosclerotic calcification of the  arterial vasculature. Heart size normal. No pericardial effusion. Chronic appearing volume loss is seen in the right lower lobe, adjacent to a chronically elevated right hemidiaphragm. Dependent atelectasis bilaterally. Lungs are otherwise clear. No pleural fluid. Airway is unremarkable.  07/13/15- 64 yoF never smoker, asthma and history of pneumonia with cough, complicated by past history surgery for thoracic outlet syndrome, CVA, GERD, allergic rhinitis, polycythemia, depression. Husband here   FOLLOWS FOR: Pt states she is having left sinus issues-? inflammation or infection-has left sided pain on face and around tooth-gets better once drains. O2 2.5 L/ [facility] Dentist did x-ray and told her she had sinus infection. She continues using oxygen and feels she needs it. Describes very easy dyspnea on exertion on room air relieved by oxygen  11/13/2015-64 year old female never smoker followed for asthma, history pneumonia, cough, Cocaine by past history surgery for thoracic outlet syndrome, CVA, GERD, allergic rhinitis, polycythemia, depression O2 2 L sleep, and prn/[facility]   husband here She refuses flu shot Using her oxygen more as the time and recognizes that it helps especially with mild exertion and activity outdoors. Feels well at today's visit with no acute issue.  Review of Systems- See HPI Constitutional:   No-   weight loss, night sweats, fevers, chills, fatigue, lassitude. HEENT:   frequent  headaches,  Some difficulty swallowing,, sore throat,       No-  sneezing, itching, ear ache, +nasal congestion, post nasal drip,  CV:  No-   chest pain, orthopnea, PND, swelling in lower extremities, anasarca, dizziness, palpitations Resp: + shortness of breath with exertion or at rest. productive cough              No-  coughing up of blood.              No-  change in color of mucus.  No- wheezing.   Skin: No-   rash or lesions. GI:  No-   heartburn, indigestion, abdominal pain, nausea,  vomiting, GU:  MS:  No-   joint pain or swelling.   Neuro- nothing new or unusual  Psych:  No- change in mood or affect. No depression or anxiety.  No memory loss.    Objective:   Physical Exam  General- Alert, Oriented, Affect-appropriate, Distress- none acute,  Overweight. O2 2.5 L/ [facility] Skin- rash-none, lesions- none, excoriation- none Lymphadenopathy- none Head- atraumatic            Eyes- Gross vision intact, PERRLA, conjunctivae clear secretions            Ears- Hearing, canals            Nose- Clear, No- Septal dev, mucus, polyps, erosion, perforation             Throat- Mallampati III , mucosa clear , drainage- none, tonsils- atrophic, +hoarse Neck- flexible , trachea midline, no stridor , thyroid nl, carotid no bruit Chest - symmetrical excursion , unlabored           Heart/CV- RRR , no murmur , no gallop  , no rub, nl s1 s2                           - JVD- none , edema- none, stasis changes- none, varices- none  Lung- clear to P&A, wheeze-none, +cough- light/ dry cough with deep breath ,                                                    dullness-none, rub- none, unlabored           Chest wall- +Vascular surgery scar Right Upper Anterior chest Abd- Br/ Gen/ Rectal- Not done, not indicated Extrem- cyanosis- none, clubbing, none, atrophy- none, strength- . +Rolling walker Neuro- + some word searching

## 2015-11-13 NOTE — Assessment & Plan Note (Signed)
Continues oxygen for sleep and using it more of the daytime especially with any activity

## 2015-12-01 ENCOUNTER — Telehealth: Payer: Self-pay | Admitting: Internal Medicine

## 2015-12-01 MED ORDER — ALBUTEROL SULFATE HFA 108 (90 BASE) MCG/ACT IN AERS: 2.0000 | INHALATION_SPRAY | Freq: Four times a day (QID) | RESPIRATORY_TRACT | Status: DC | PRN

## 2015-12-01 NOTE — Telephone Encounter (Signed)
Spoke with pt spouse. Advised proair and proventil are both rescue inhalers. He asked that we refill the proair to [facility]. I have done so. Nothing further needed

## 2015-12-07 ENCOUNTER — Telehealth: Payer: Self-pay | Admitting: Family Medicine

## 2015-12-07 MED ORDER — PRAVASTATIN SODIUM 40 MG PO TABS: 40.0000 mg | ORAL_TABLET | Freq: Every day | ORAL | Status: DC

## 2015-12-07 NOTE — Telephone Encounter (Signed)
Patient calling to get 90 day supply of her pravastatin sent to [facility] if possible  [phone] if any questions

## 2015-12-07 NOTE — Telephone Encounter (Signed)
Medication called/sent to requested pharmacy  

## 2015-12-20 ENCOUNTER — Other Ambulatory Visit (HOSPITAL_BASED_OUTPATIENT_CLINIC_OR_DEPARTMENT_OTHER): Payer: Medicare Other

## 2015-12-20 ENCOUNTER — Ambulatory Visit (HOSPITAL_BASED_OUTPATIENT_CLINIC_OR_DEPARTMENT_OTHER): Payer: Medicare Other | Admitting: Family

## 2015-12-20 ENCOUNTER — Encounter: Payer: Self-pay | Admitting: Family

## 2015-12-20 VITALS — BP 113/63 | HR 74 | Temp 98.2°F | Resp 16 | Ht 67.0 in | Wt 188.0 lb

## 2015-12-20 DIAGNOSIS — D45 Polycythemia vera: Secondary | ICD-10-CM | POA: Diagnosis not present

## 2015-12-20 LAB — CBC WITH DIFFERENTIAL (CANCER CENTER ONLY)
BASO#: 0 10*3/uL (ref 0.0–0.2)
BASO%: 0.5 % (ref 0.0–2.0)
EOS ABS: 0.1 10*3/uL (ref 0.0–0.5)
EOS%: 1.9 % (ref 0.0–7.0)
HCT: 36.8 % (ref 34.8–46.6)
HGB: 11.1 g/dL — ABNORMAL LOW (ref 11.6–15.9)
LYMPH#: 1.6 10*3/uL (ref 0.9–3.3)
LYMPH%: 24.4 % (ref 14.0–48.0)
MCH: 21.7 pg — ABNORMAL LOW (ref 26.0–34.0)
MCHC: 30.2 g/dL — ABNORMAL LOW (ref 32.0–36.0)
MCV: 72 fL — AB (ref 81–101)
MONO#: 0.5 10*3/uL (ref 0.1–0.9)
MONO%: 7.2 % (ref 0.0–13.0)
NEUT#: 4.2 10*3/uL (ref 1.5–6.5)
NEUT%: 66 % (ref 39.6–80.0)
PLATELETS: 272 10*3/uL (ref 145–400)
RBC: 5.12 10*6/uL (ref 3.70–5.32)
RDW: 17.8 % — ABNORMAL HIGH (ref 11.1–15.7)
WBC: 6.4 10*3/uL (ref 3.9–10.0)

## 2015-12-20 LAB — COMPREHENSIVE METABOLIC PANEL
ALT: 16 U/L (ref 0–55)
ANION GAP: 8 meq/L (ref 3–11)
AST: 18 U/L (ref 5–34)
Albumin: 3.9 g/dL (ref 3.5–5.0)
Alkaline Phosphatase: 68 U/L (ref 40–150)
BILIRUBIN TOTAL: 0.31 mg/dL (ref 0.20–1.20)
BUN: 15.4 mg/dL (ref 7.0–26.0)
CO2: 27 meq/L (ref 22–29)
CREATININE: 0.8 mg/dL (ref 0.6–1.1)
Calcium: 9.2 mg/dL (ref 8.4–10.4)
Chloride: 106 mEq/L (ref 98–109)
EGFR: 74 mL/min/{1.73_m2} — ABNORMAL LOW (ref >90)
GLUCOSE: 89 mg/dL (ref 70–140)
Potassium: 3.9 mEq/L (ref 3.5–5.1)
SODIUM: 141 meq/L (ref 136–145)
TOTAL PROTEIN: 6.8 g/dL (ref 6.4–8.3)

## 2015-12-20 NOTE — Progress Notes (Signed)
Hematology and Oncology Follow Up Visit  Natalie Hendrix [id number] [birth year] 65 y.o. 12/20/2015   Principle Diagnosis:  Polycythemia vera- JAK2 negative  Current Therapy:   Phlebotomy to maintain hematocrit less than 38% Plavix 75 mg by mouth daily    Interim History:  Natalie Hendrix is here today for a follow-up. She had some back discomfort due to bulging discs at C4-C5 and C6-C7. She states that she had an episode or orthostatic hypotension at home where she stood up too fast and passed out. Thankfully her husband was there and caught her so she was not injured.  She states that she is scheduled to have a bone density scan next week.  She has had issues with balance since having her stroke in 1999. She has bouts of dizziness due to vertigo.  She continues to have migraines. Her syncopal episode was followed by a migraine for several days. She is looking for a new neurologist now that Dr. Erling Cruz has retired. She does wear 2L of O2 at night and as needed with activity. She will have SOB with any exertion.  No fever, chills, n/v, cough, rash, chest pain, palpitations, abdominal pain or changes in her bowel habits.  No lymphadenopathy found on exam. No episodes of bleeding or bruising while on Plavix.  She still suffers from arthritic pains in her hands and knees.  The neuropathy in her hands and feet is unchanged. She wears compression stockings daily.  She has a good appetite and is staying well hydrated. Her weight is down 5 lbs since her last visit. She is drinking protein shakes daily.   Medications:    Medication List       This list is accurate as of: 12/20/15  2:21 PM.  Always use your most recent med list.               albuterol (2.5 MG/3ML) 0.083% nebulizer solution  Commonly known as:  PROVENTIL  Take 3 mLs (2.5 mg total) by nebulization every 6 (six) hours as needed for wheezing or shortness of breath.     albuterol 108 (90 Base) MCG/ACT inhaler  Commonly known as:   PROAIR HFA  Inhale 2 puffs into the lungs every 6 (six) hours as needed for wheezing or shortness of breath.     Alpha Lipoic Acid 200 MG Caps  Take by mouth every morning.     azelastine 0.1 % nasal spray  Commonly known as:  ASTELIN  Place 1 spray into both nostrils 2 (two) times daily. Use in each nostril as directed     beclomethasone 42 MCG/SPRAY nasal spray  Commonly known as:  BECONASE-AQ  Place 2 sprays into both nostrils 2 (two) times daily. Dose is for each nostril.     benzonatate 100 MG capsule  Commonly known as:  TESSALON  Take 100 mg by mouth every 6 (six) hours as needed for cough.     buPROPion 300 MG 24 hr tablet  Commonly known as:  WELLBUTRIN XL  Take 1 tablet (300 mg total) by mouth daily with breakfast.     CALCIUM 1200 PO  Take 2,400 mg by mouth daily.     CENTRUM SILVER tablet  Take 1 tablet by mouth daily.     clopidogrel 75 MG tablet  Commonly known as:  PLAVIX  Take 1 tablet (75 mg total) by mouth daily.     co-enzyme Q-10 30 MG capsule  Take 30 mg by mouth daily.  cyclobenzaprine 10 MG tablet  Commonly known as:  FLEXERIL  Take 10 mg by mouth 3 (three) times daily as needed for muscle spasms.     ESTRACE VAGINAL 0.1 MG/GM vaginal cream  Generic drug:  estradiol  Place 1 Applicatorful vaginally daily.     ezetimibe 10 MG tablet  Commonly known as:  ZETIA  TAKE 1 TABLET BY MOUTH DAILY.     fexofenadine 180 MG tablet  Commonly known as:  ALLEGRA  Take 1 tablet (180 mg total) by mouth daily as needed for allergies. As needed     Fish Oil 1200 MG Caps  Take 2,400 mg by mouth 2 (two) times daily.     Flaxseed Oil 1200 MG Caps  Take 1 capsule by mouth 1 day or 1 dose.     FLECTOR 1.3 % Ptch  Generic drug:  diclofenac  Place 1 patch onto the skin 2 (two) times daily.     Grape Seed Extract 30 MG Caps  Take 1 capsule by mouth every morning.     lidocaine 5 %  Commonly known as:  LIDODERM  Place 1 patch onto the skin as needed  (pain). Remove & Discard patch within 12 hours or as directed by MD     LORazepam 1 MG tablet  Commonly known as:  ATIVAN  Take 1 tablet (1 mg total) by mouth 2 (two) times daily.     magic mouthwash w/lidocaine Soln  Take 5 mLs by mouth 4 (four) times daily.     magnesium gluconate 500 MG tablet  Commonly known as:  MAGONATE  Take 500 mg by mouth daily as needed (constipation).     meclizine 25 MG tablet  Commonly known as:  ANTIVERT  Take 1 tablet (25 mg total) by mouth 3 (three) times daily as needed for dizziness. 3 month supply     metoprolol succinate 50 MG 24 hr tablet  Commonly known as:  TOPROL-XL  Take 1 tablet (50 mg total) by mouth daily with breakfast.     morphine 15 MG tablet  Commonly known as:  MSIR  Take 15 mg by mouth every 4 (four) hours as needed for severe pain.     morphine 30 MG 12 hr tablet  Commonly known as:  MS CONTIN  Take 15 mg by mouth 2 (two) times daily.     Nebulizer Devi  Use as directed     nystatin 100000 UNIT/ML suspension  Commonly known as:  MYCOSTATIN  Take 5 mLs (500,000 Units total) by mouth 4 (four) times daily.     OXYGEN  Inhale 2.5 L/hr into the lungs continuous.     pantoprazole 40 MG tablet  Commonly known as:  PROTONIX  TAKE 1 TABLET (40 MG TOTAL) BY MOUTH DAILY.     pravastatin 40 MG tablet  Commonly known as:  PRAVACHOL  Take 1 tablet (40 mg total) by mouth at bedtime.     promethazine 25 MG tablet  Commonly known as:  PHENERGAN  Take 25 mg by mouth every 8 (eight) hours as needed.     riboflavin 100 MG Tabs tablet  Commonly known as:  VITAMIN B-2  Take 200 mg by mouth 2 (two) times daily before a meal.     SYSTANE 0.4-0.3 % Soln  Generic drug:  Polyethyl Glycol-Propyl Glycol  Apply to eye.     TART CHERRY ADVANCED PO  Take 5 mLs by mouth daily. Juice Concentrate- 1tsp daily     Turmeric  Curcumin 500 MG Caps  Take 1 capsule by mouth daily.     ZOVIRAX 5 %  Generic drug:  acyclovir ointment  Apply 1  application topically as needed (flair).        Allergies:  Allergies  Allergen Reactions  . Qvar [Beclomethasone]   . Spiriva [Tiotropium Bromide Monohydrate]     rash  . Chlorzoxazone   . Epinephrine     Raises heart rate  . Lamotrigine   . Levofloxacin   . Neurontin [Gabapentin]     arthralgia  . Protriptyline Hcl     Severe constipation  . Ropinirole Hcl     arthralgia  . Tizanidine     panic  . Verapamil   . Zonegran     Mood swings  . Advair Diskus [Fluticasone-Salmeterol] Rash  . Baclofen Rash  . Chlorzoxazone Rash  . Levofloxacin Rash    Past Medical History, Surgical history, Social history, and Family History were reviewed and updated.  Review of Systems: All other 10 point review of systems is negative.   Physical Exam:  vitals were not taken for this visit.  Wt Readings from Last 3 Encounters:  11/13/15 189 lb (85.73 kg)  10/20/15 193 lb (87.544 kg)  09/26/15 193 lb (87.544 kg)    Ocular: Sclerae unicteric, pupils equal, round and reactive to light Ear-nose-throat: Oropharynx clear, dentition fair Lymphatic: No cervical supraclavicular or axillary adenopathy Lungs no rales or rhonchi, good excursion bilaterally Heart regular rate and rhythm, no murmur appreciated Abd soft, nontender, positive bowel sounds, no palpable liver or spleen tip MSK no focal spinal tenderness, no joint edema Neuro: non-focal, well-oriented, appropriate affect Breasts: Deferred  Lab Results  Component Value Date   WBC 6.4 12/20/2015   HGB 11.1* 12/20/2015   HCT 36.8 12/20/2015   MCV 72* 12/20/2015   PLT 272 12/20/2015   Lab Results  Component Value Date   FERRITIN 5* 10/20/2015   IRON 28* 10/20/2015   TIBC 440 10/20/2015   UIBC 412* 10/20/2015   IRONPCTSAT 6* 10/20/2015   Lab Results  Component Value Date   RETICCTPCT 1.1 04/27/2015   RBC 5.12 12/20/2015   RETICCTABS 55.1 04/27/2015   No results found for: KPAFRELGTCHN, LAMBDASER, KAPLAMBRATIO No  results found for: IGGSERUM, IGA, IGMSERUM No results found for: Odetta Pink, SPEI   Chemistry      Component Value Date/Time   NA 140 10/20/2015 1313   NA 140 04/27/2015 1516   NA 146* 10/17/2014 1413   K 4.5 10/20/2015 1313   K 4.5 04/27/2015 1516   K 4.3 10/17/2014 1413   CL 106 04/27/2015 1516   CL 102 10/17/2014 1413   CO2 26 10/20/2015 1313   CO2 26 04/27/2015 1516   CO2 25 10/17/2014 1413   BUN 12.3 10/20/2015 1313   BUN 13 04/27/2015 1516   BUN 14 10/17/2014 1413   CREATININE 0.8 10/20/2015 1313   CREATININE 0.83 04/27/2015 1516   CREATININE 0.7 10/17/2014 1413      Component Value Date/Time   CALCIUM 9.2 10/20/2015 1313   CALCIUM 9.5 04/27/2015 1516   CALCIUM 9.3 10/17/2014 1413   ALKPHOS 63 10/20/2015 1313   ALKPHOS 57 04/27/2015 1516   ALKPHOS 54 10/17/2014 1413   AST 21 10/20/2015 1313   AST 28 04/27/2015 1516   AST 34 10/17/2014 1413   ALT 15 10/20/2015 1313   ALT 24 04/27/2015 1516   ALT 33 10/17/2014 1413   BILITOT  0.33 10/20/2015 1313   BILITOT 0.4 04/27/2015 1516   BILITOT 0.70 10/17/2014 1413     Impression and Plan: Ms. Camberos is 65 yo white female with polycythemia. She has responded   nicely to phlebotomies and has not required one in quite some time. Her Hct is now down to 36.8.    She will not need a phlebotomy at this time.   We will plan to see her back in 2 months for labs and follow-up.   She will contact us with any questions or concerns. We can certainly see her sooner if need be.   Eliezer Bottom, NP 1/18/20172:21 PM

## 2015-12-21 LAB — IRON AND TIBC
%SAT: 6 % — ABNORMAL LOW (ref 21–57)
Iron: 27 ug/dL — ABNORMAL LOW (ref 41–142)
TIBC: 456 ug/dL — ABNORMAL HIGH (ref 236–444)
UIBC: 429 ug/dL — AB (ref 120–384)

## 2015-12-21 LAB — FERRITIN: FERRITIN: 4 ng/mL — AB (ref 9–269)

## 2015-12-22 ENCOUNTER — Telehealth: Payer: Self-pay | Admitting: Hematology & Oncology

## 2015-12-22 NOTE — Telephone Encounter (Signed)
Faxed [employer] completed form to:  [employer] P: [phone] F: [id number]   Policy: [id number] Claim: [id number]     [place]

## 2016-01-19 NOTE — Addendum Note (Signed)
Addended by: Arta Silence on: 01/19/2016 09:59 AM   Modules accepted: Orders

## 2016-01-26 ENCOUNTER — Encounter: Payer: Self-pay | Admitting: Gastroenterology

## 2016-02-13 ENCOUNTER — Telehealth: Payer: Self-pay | Admitting: Family Medicine

## 2016-02-13 MED ORDER — LORAZEPAM 1 MG PO TABS: 1.0000 mg | ORAL_TABLET | Freq: Two times a day (BID) | ORAL | Status: DC

## 2016-02-13 NOTE — Telephone Encounter (Signed)
ok 

## 2016-02-13 NOTE — Telephone Encounter (Signed)
?   OK to Refill  

## 2016-02-13 NOTE — Telephone Encounter (Signed)
[facility]  [phone] or [phone] Patient needs rx for her lorazapam sent to address above

## 2016-02-13 NOTE — Telephone Encounter (Signed)
Med faxed to pharm as requested

## 2016-02-16 ENCOUNTER — Telehealth: Payer: Self-pay | Admitting: Family Medicine

## 2016-02-16 MED ORDER — LORAZEPAM 1 MG PO TABS: 1.0000 mg | ORAL_TABLET | Freq: Two times a day (BID) | ORAL | Status: DC

## 2016-02-16 NOTE — Telephone Encounter (Signed)
Ok

## 2016-02-16 NOTE — Telephone Encounter (Signed)
Medication called/sent to requested pharmacy and pt aware 

## 2016-02-16 NOTE — Telephone Encounter (Signed)
[facility].  They will not be able to get to pt for 2 weeks.  Can we have small amt to local pharmacy to hold her over??

## 2016-02-19 ENCOUNTER — Ambulatory Visit: Payer: Medicare Other | Admitting: Hematology & Oncology

## 2016-02-19 ENCOUNTER — Other Ambulatory Visit: Payer: Medicare Other

## 2016-02-19 ENCOUNTER — Telehealth: Payer: Self-pay | Admitting: Family Medicine

## 2016-02-19 NOTE — Telephone Encounter (Signed)
PA submitted through [email or web address]  

## 2016-02-21 NOTE — Telephone Encounter (Signed)
Lorazepam has been approved - pharm aware

## 2016-02-23 ENCOUNTER — Other Ambulatory Visit: Payer: Self-pay | Admitting: *Deleted

## 2016-02-23 DIAGNOSIS — D751 Secondary polycythemia: Secondary | ICD-10-CM

## 2016-02-23 NOTE — Telephone Encounter (Signed)
Pt called stating needs 90 day supply of protonix 40 sent to her mail order pharmacy [phone] or [phone] needs to be printed and faxed to pharmacy

## 2016-02-26 ENCOUNTER — Ambulatory Visit: Payer: Medicare Other | Admitting: Family

## 2016-02-26 ENCOUNTER — Other Ambulatory Visit: Payer: Medicare Other

## 2016-02-26 MED ORDER — PANTOPRAZOLE SODIUM 40 MG PO TBEC: DELAYED_RELEASE_TABLET | ORAL | Status: DC

## 2016-02-28 ENCOUNTER — Other Ambulatory Visit (HOSPITAL_BASED_OUTPATIENT_CLINIC_OR_DEPARTMENT_OTHER): Payer: Medicare Other

## 2016-02-28 ENCOUNTER — Other Ambulatory Visit: Payer: Self-pay | Admitting: Family

## 2016-02-28 ENCOUNTER — Ambulatory Visit (HOSPITAL_BASED_OUTPATIENT_CLINIC_OR_DEPARTMENT_OTHER): Payer: Medicare Other | Admitting: Family

## 2016-02-28 ENCOUNTER — Ambulatory Visit: Payer: Medicare Other

## 2016-02-28 ENCOUNTER — Encounter: Payer: Self-pay | Admitting: Family

## 2016-02-28 VITALS — BP 110/45 | HR 74 | Temp 98.1°F | Resp 46 | Ht 67.0 in | Wt 193.0 lb

## 2016-02-28 DIAGNOSIS — D45 Polycythemia vera: Secondary | ICD-10-CM

## 2016-02-28 LAB — CBC WITH DIFFERENTIAL (CANCER CENTER ONLY)
BASO#: 0 10*3/uL (ref 0.0–0.2)
BASO%: 0.4 % (ref 0.0–2.0)
EOS ABS: 0.1 10*3/uL (ref 0.0–0.5)
EOS%: 2.1 % (ref 0.0–7.0)
HEMATOCRIT: 34 % — AB (ref 34.8–46.6)
HEMOGLOBIN: 10.6 g/dL — AB (ref 11.6–15.9)
LYMPH#: 1.2 10*3/uL (ref 0.9–3.3)
LYMPH%: 23.1 % (ref 14.0–48.0)
MCH: 22.6 pg — AB (ref 26.0–34.0)
MCHC: 31.2 g/dL — AB (ref 32.0–36.0)
MCV: 73 fL — ABNORMAL LOW (ref 81–101)
MONO#: 0.5 10*3/uL (ref 0.1–0.9)
MONO%: 9.6 % (ref 0.0–13.0)
NEUT%: 64.8 % (ref 39.6–80.0)
NEUTROS ABS: 3.5 10*3/uL (ref 1.5–6.5)
Platelets: 234 10*3/uL (ref 145–400)
RBC: 4.68 10*6/uL (ref 3.70–5.32)
RDW: 17.9 % — ABNORMAL HIGH (ref 11.1–15.7)
WBC: 5.3 10*3/uL (ref 3.9–10.0)

## 2016-02-28 LAB — COMPREHENSIVE METABOLIC PANEL
ALBUMIN: 3.5 g/dL (ref 3.5–5.0)
ALK PHOS: 60 U/L (ref 40–150)
ALT: 18 U/L (ref 0–55)
AST: 22 U/L (ref 5–34)
Anion Gap: 7 mEq/L (ref 3–11)
BUN: 15.2 mg/dL (ref 7.0–26.0)
CALCIUM: 8.7 mg/dL (ref 8.4–10.4)
CO2: 26 mEq/L (ref 22–29)
Chloride: 107 mEq/L (ref 98–109)
Creatinine: 0.7 mg/dL (ref 0.6–1.1)
EGFR: 85 mL/min/{1.73_m2} — AB (ref >90)
Glucose: 112 mg/dl (ref 70–140)
POTASSIUM: 4.5 meq/L (ref 3.5–5.1)
Sodium: 140 mEq/L (ref 136–145)
TOTAL PROTEIN: 6.4 g/dL (ref 6.4–8.3)

## 2016-02-28 NOTE — Progress Notes (Signed)
Hematology and Oncology Follow Up Visit  Natalie Hendrix [id number] 1951/09/28 65 y.o. 02/28/2016   Principle Diagnosis:  Polycythemia vera- JAK2 negative  Current Therapy:   Phlebotomy to maintain hematocrit less than 38% Plavix 75 mg by mouth daily    Interim History:  Natalie Hendrix is here today for a follow-up. Unfortunately she is having a hard time right now due to the death of her grandson. He son died in [place] several years ago and they recently found out that he had a son no one knew about. The grandson was hit by a car while riding his bike to work and died last month before they were able to meet in person. This has devastated her and she is grieving. She has a wonderful support system at home and they are working through this together.  She wears 2L supplemental O2 at night and PRN. She has SOB with activity and will take breaks as needed.  She has had issues with balance since having her stroke in 1999 and has bouts of dizziness due to vertigo.  She continues to have migraines periodically.   No fever, chills, n/v, cough, rash, chest pain, palpitations, abdominal pain or changes in her bowel habits.  No lymphadenopathy found on exam. No episodes of bleeding or bruising while on Plavix.  She will be having oral surgery in the next couple of months to remove some scar tissue. She will stop her Plavix 5 days before the procedure and then resume the day after.  She still suffers from arthritic pains in her hands and knees and has started wrapping her joint with kinetic tape. This has made a significant difference in her discomfort.  The neuropathy in her hands and feet is unchanged. She wears compression stockings daily.  Her appetite is decreased but she is supplementing with protein shakes throughout the day. She is staying well hydrated. Her weight is stable.   Medications:    Medication List       This list is accurate as of: 02/28/16  1:33 PM.  Always use your most recent med  list.               albuterol (2.5 MG/3ML) 0.083% nebulizer solution  Commonly known as:  PROVENTIL  Take 3 mLs (2.5 mg total) by nebulization every 6 (six) hours as needed for wheezing or shortness of breath.     albuterol 108 (90 Base) MCG/ACT inhaler  Commonly known as:  PROAIR HFA  Inhale 2 puffs into the lungs every 6 (six) hours as needed for wheezing or shortness of breath.     Alpha Lipoic Acid 200 MG Caps  Take by mouth every morning.     azelastine 0.1 % nasal spray  Commonly known as:  ASTELIN  Place 1 spray into both nostrils 2 (two) times daily. Use in each nostril as directed     beclomethasone 42 MCG/SPRAY nasal spray  Commonly known as:  BECONASE-AQ  Place 2 sprays into both nostrils 2 (two) times daily. Dose is for each nostril.     benzonatate 100 MG capsule  Commonly known as:  TESSALON  Take 100 mg by mouth every 6 (six) hours as needed for cough.     buPROPion 300 MG 24 hr tablet  Commonly known as:  WELLBUTRIN XL  Take 1 tablet (300 mg total) by mouth daily with breakfast.     CALCIUM 1200 PO  Take 2,400 mg by mouth daily.     CENTRUM SILVER  tablet  Take 1 tablet by mouth daily.     clopidogrel 75 MG tablet  Commonly known as:  PLAVIX  Take 1 tablet (75 mg total) by mouth daily.     co-enzyme Q-10 30 MG capsule  Take 30 mg by mouth daily.     cyclobenzaprine 10 MG tablet  Commonly known as:  FLEXERIL  Take 10 mg by mouth 3 (three) times daily as needed for muscle spasms.     ESTRACE VAGINAL 0.1 MG/GM vaginal cream  Generic drug:  estradiol  Place 1 Applicatorful vaginally daily.     ezetimibe 10 MG tablet  Commonly known as:  ZETIA  TAKE 1 TABLET BY MOUTH DAILY.     fexofenadine 180 MG tablet  Commonly known as:  ALLEGRA  Take 1 tablet (180 mg total) by mouth daily as needed for allergies. As needed     Fish Oil 1200 MG Caps  Take 2,400 mg by mouth 2 (two) times daily.     Flaxseed Oil 1200 MG Caps  Take 1 capsule by mouth 1 day  or 1 dose.     FLECTOR 1.3 % Ptch  Generic drug:  diclofenac  Place 1 patch onto the skin 2 (two) times daily.     Grape Seed Extract 30 MG Caps  Take 1 capsule by mouth every morning.     lidocaine 5 %  Commonly known as:  LIDODERM  Place 1 patch onto the skin as needed (pain). Remove & Discard patch within 12 hours or as directed by MD     LORazepam 1 MG tablet  Commonly known as:  ATIVAN  Take 1 tablet (1 mg total) by mouth 2 (two) times daily.     magic mouthwash w/lidocaine Soln  Take 5 mLs by mouth 4 (four) times daily.     magnesium gluconate 500 MG tablet  Commonly known as:  MAGONATE  Take 500 mg by mouth daily as needed (constipation).     meclizine 25 MG tablet  Commonly known as:  ANTIVERT  Take 1 tablet (25 mg total) by mouth 3 (three) times daily as needed for dizziness. 3 month supply     metoprolol succinate 50 MG 24 hr tablet  Commonly known as:  TOPROL-XL  Take 1 tablet (50 mg total) by mouth daily with breakfast.     morphine 15 MG tablet  Commonly known as:  MSIR  Take 15 mg by mouth every 4 (four) hours as needed for severe pain.     morphine 30 MG 12 hr tablet  Commonly known as:  MS CONTIN  Take 15 mg by mouth 2 (two) times daily.     Nebulizer Devi  Use as directed     nystatin 100000 UNIT/ML suspension  Commonly known as:  MYCOSTATIN  Take 5 mLs (500,000 Units total) by mouth 4 (four) times daily.     OXYGEN  Inhale 2.5 L/hr into the lungs continuous.     pantoprazole 40 MG tablet  Commonly known as:  PROTONIX  TAKE 1 TABLET (40 MG TOTAL) BY MOUTH DAILY.     pravastatin 40 MG tablet  Commonly known as:  PRAVACHOL  Take 1 tablet (40 mg total) by mouth at bedtime.     promethazine 25 MG tablet  Commonly known as:  PHENERGAN  Take 25 mg by mouth every 8 (eight) hours as needed.     riboflavin 100 MG Tabs tablet  Commonly known as:  VITAMIN B-2  Take 200 mg  by mouth 2 (two) times daily before a meal.     SYSTANE 0.4-0.3 % Soln    Generic drug:  Polyethyl Glycol-Propyl Glycol  Apply to eye.     TART CHERRY ADVANCED PO  Take 5 mLs by mouth daily. Juice Concentrate- 1tsp daily     Turmeric Curcumin 500 MG Caps  Take 1 capsule by mouth daily.     ZOVIRAX 5 %  Generic drug:  acyclovir ointment  Apply 1 application topically as needed (flair).        Allergies:  Allergies  Allergen Reactions  . Qvar [Beclomethasone]   . Spiriva [Tiotropium Bromide Monohydrate]     rash  . Chlorzoxazone   . Epinephrine     Raises heart rate  . Lamotrigine   . Levofloxacin   . Neurontin [Gabapentin]     arthralgia  . Protriptyline Hcl     Severe constipation  . Ropinirole Hcl     arthralgia  . Tizanidine     panic  . Verapamil   . Zonegran     Mood swings  . Advair Diskus [Fluticasone-Salmeterol] Rash  . Baclofen Rash  . Chlorzoxazone Rash  . Levofloxacin Rash    Past Medical History, Surgical history, Social history, and Family History were reviewed and updated.  Review of Systems: All other 10 point review of systems is negative.   Physical Exam:  vitals were not taken for this visit.  Wt Readings from Last 3 Encounters:  12/20/15 188 lb (85.276 kg)  11/13/15 189 lb (85.73 kg)  10/20/15 193 lb (87.544 kg)    Ocular: Sclerae unicteric, pupils equal, round and reactive to light Ear-nose-throat: Oropharynx clear, dentition fair Lymphatic: No cervical supraclavicular or axillary adenopathy Lungs no rales or rhonchi, good excursion bilaterally Heart regular rate and rhythm, no murmur appreciated Abd soft, nontender, positive bowel sounds, no palpable liver or spleen tip, no fluid wave MSK no focal spinal tenderness, no joint edema Neuro: non-focal, well-oriented, appropriate affect Breasts: Deferred  Lab Results  Component Value Date   WBC 6.4 12/20/2015   HGB 11.1* 12/20/2015   HCT 36.8 12/20/2015   MCV 72* 12/20/2015   PLT 272 12/20/2015   Lab Results  Component Value Date   FERRITIN 4*  12/20/2015   IRON 27* 12/20/2015   TIBC 456* 12/20/2015   UIBC 429* 12/20/2015   IRONPCTSAT 6* 12/20/2015   Lab Results  Component Value Date   RETICCTPCT 1.1 04/27/2015   RBC 5.12 12/20/2015   RETICCTABS 55.1 04/27/2015   No results found for: KPAFRELGTCHN, LAMBDASER, KAPLAMBRATIO No results found for: IGGSERUM, IGA, IGMSERUM No results found for: Odetta Pink, SPEI   Chemistry      Component Value Date/Time   NA 141 12/20/2015 1404   NA 140 04/27/2015 1516   NA 146* 10/17/2014 1413   K 3.9 12/20/2015 1404   K 4.5 04/27/2015 1516   K 4.3 10/17/2014 1413   CL 106 04/27/2015 1516   CL 102 10/17/2014 1413   CO2 27 12/20/2015 1404   CO2 26 04/27/2015 1516   CO2 25 10/17/2014 1413   BUN 15.4 12/20/2015 1404   BUN 13 04/27/2015 1516   BUN 14 10/17/2014 1413   CREATININE 0.8 12/20/2015 1404   CREATININE 0.83 04/27/2015 1516   CREATININE 0.7 10/17/2014 1413      Component Value Date/Time   CALCIUM 9.2 12/20/2015 1404   CALCIUM 9.5 04/27/2015 1516   CALCIUM 9.3 10/17/2014 1413  ALKPHOS 68 12/20/2015 1404   ALKPHOS 57 04/27/2015 1516   ALKPHOS 54 10/17/2014 1413   AST 18 12/20/2015 1404   AST 28 04/27/2015 1516   AST 34 10/17/2014 1413   ALT 16 12/20/2015 1404   ALT 24 04/27/2015 1516   ALT 33 10/17/2014 1413   BILITOT 0.31 12/20/2015 1404   BILITOT 0.4 04/27/2015 1516   BILITOT 0.70 10/17/2014 1413     Impression and Plan: Ms. Sinon is 65 yo white female with polycythemia. She has had a nice response to phlebotomies and her Hct is now 34. Unfortunately, she has had a rough time with the death of her grandson and is dealing with a lot of grief. She has a good support system at home and is thankful for that.  She will not need a phlebotomy at this time.  We will plan to see her back in 3 months for labs and follow-up.  She will contact us with any questions or concerns. We can certainly see her sooner if need be.    Eliezer Bottom, NP 3/29/20171:33 PM

## 2016-02-28 NOTE — Progress Notes (Signed)
No phlebotomy today per [name] NP

## 2016-02-29 LAB — IRON AND TIBC
%SAT: 5 % — AB (ref 21–57)
Iron: 21 ug/dL — ABNORMAL LOW (ref 41–142)
TIBC: 453 ug/dL — ABNORMAL HIGH (ref 236–444)
UIBC: 432 ug/dL — ABNORMAL HIGH (ref 120–384)

## 2016-02-29 LAB — FERRITIN

## 2016-03-01 ENCOUNTER — Encounter: Payer: Self-pay | Admitting: Hematology & Oncology

## 2016-03-01 ENCOUNTER — Encounter: Payer: Self-pay | Admitting: Family Medicine

## 2016-03-01 ENCOUNTER — Telehealth: Payer: Self-pay | Admitting: *Deleted

## 2016-03-01 DIAGNOSIS — D751 Secondary polycythemia: Secondary | ICD-10-CM

## 2016-03-01 NOTE — Telephone Encounter (Signed)
Patient has been having increased vertigo, memory lapses, and slight confusion. They have been making attempts at getting a new neurologist but haven't yet found one they like and/or one that takes their insurance.   Spoke to [name] NP who saw her two days ago on 3/29. Labs and assessment at that time was good. Patient will need to follow up with her PCP regarding symptoms and possible additional referrals for neurology. Husband understood.

## 2016-03-04 ENCOUNTER — Other Ambulatory Visit: Payer: Self-pay | Admitting: Nurse Practitioner

## 2016-03-04 MED ORDER — CLOPIDOGREL BISULFATE 75 MG PO TABS: 75.0000 mg | ORAL_TABLET | Freq: Every day | ORAL | Status: DC

## 2016-03-04 MED ORDER — PANTOPRAZOLE SODIUM 40 MG PO TBEC: DELAYED_RELEASE_TABLET | ORAL | Status: DC

## 2016-04-17 ENCOUNTER — Telehealth: Payer: Self-pay | Admitting: Internal Medicine

## 2016-04-17 MED ORDER — AMOXICILLIN-POT CLAVULANATE 875-125 MG PO TABS
1.0000 | ORAL_TABLET | Freq: Two times a day (BID) | ORAL | Status: DC
Start: 2016-04-17 — End: 2016-06-11

## 2016-04-17 NOTE — Telephone Encounter (Signed)
Offer augmentin 875, # 14, 1 twice daily 

## 2016-04-17 NOTE — Telephone Encounter (Signed)
Spoke with pt's husband and he states that pt has been sick for 4-5 days. She has cough with green-brown mucus, some wheeze and increased SOB, low grade fever and fatigue. They would like to know what you recommend or if she needs to be seen.   CY please advise.    Allergies  Allergen Reactions  . Qvar [Beclomethasone]   . Spiriva [Tiotropium Bromide Monohydrate]     rash  . Chlorzoxazone   . Epinephrine     Raises heart rate  . Lamotrigine   . Levofloxacin   . Neurontin [Gabapentin]     arthralgia  . Protriptyline Hcl     Severe constipation  . Ropinirole Hcl     arthralgia  . Tizanidine     panic  . Verapamil   . Zonegran     Mood swings  . Advair Diskus [Fluticasone-Salmeterol] Rash  . Baclofen Rash  . Chlorzoxazone Rash  . Levofloxacin Rash    Current Outpatient Prescriptions on File Prior to Visit  Medication Sig Dispense Refill  . albuterol (PROAIR HFA) 108 (90 Base) MCG/ACT inhaler Inhale 2 puffs into the lungs every 6 (six) hours as needed for wheezing or shortness of breath. 3 Inhaler 3  . albuterol (PROVENTIL) (2.5 MG/3ML) 0.083% nebulizer solution Take 3 mLs (2.5 mg total) by nebulization every 6 (six) hours as needed for wheezing or shortness of breath. 360 mL 3  . Alpha Lipoic Acid 200 MG CAPS Take by mouth every morning.     . Alum & Mag Hydroxide-Simeth (MAGIC MOUTHWASH W/LIDOCAINE) SOLN Take 5 mLs by mouth 4 (four) times daily. 240 mL 3  . azelastine (ASTELIN) 0.1 % nasal spray Place 1 spray into both nostrils 2 (two) times daily. Use in each nostril as directed 90 mL 3  . beclomethasone (BECONASE-AQ) 42 MCG/SPRAY nasal spray Place 2 sprays into both nostrils 2 (two) times daily. Dose is for each nostril. 75 g 3  . benzonatate (TESSALON) 100 MG capsule Take 100 mg by mouth every 6 (six) hours as needed for cough.    Marland Kitchen buPROPion (WELLBUTRIN XL) 300 MG 24 hr tablet Take 1 tablet (300 mg total) by mouth daily with breakfast. 90 tablet 3  . Calcium Carbonate-Vit  D-Min (CALCIUM 1200 PO) Take 2,400 mg by mouth daily.     . clopidogrel (PLAVIX) 75 MG tablet Take 1 tablet (75 mg total) by mouth daily. 90 tablet 3  . co-enzyme Q-10 30 MG capsule Take 30 mg by mouth daily.     . cyclobenzaprine (FLEXERIL) 10 MG tablet Take 10 mg by mouth 3 (three) times daily as needed for muscle spasms.     . cyclobenzaprine (FLEXERIL) 5 MG tablet   0  . diclofenac (FLECTOR) 1.3 % PTCH Place 1 patch onto the skin 2 (two) times daily.    Marland Kitchen ESTRACE VAGINAL 0.1 MG/GM vaginal cream Place 1 Applicatorful vaginally daily.     Marland Kitchen ezetimibe (ZETIA) 10 MG tablet TAKE 1 TABLET BY MOUTH DAILY. 90 tablet 3  . fexofenadine (ALLEGRA) 180 MG tablet Take 1 tablet (180 mg total) by mouth daily as needed for allergies. As needed 90 tablet 3  . Flaxseed, Linseed, (FLAXSEED OIL) 1200 MG CAPS Take 1 capsule by mouth 1 day or 1 dose.     . Grape Seed Extract 30 MG CAPS Take 1 capsule by mouth every morning.     . lidocaine (LIDODERM) 5 % Place 1 patch onto the skin as needed (pain). Remove & Discard  patch within 12 hours or as directed by MD    . LORazepam (ATIVAN) 1 MG tablet Take 1 tablet (1 mg total) by mouth 2 (two) times daily. 60 tablet 0  . magnesium gluconate (MAGONATE) 500 MG tablet Take 500 mg by mouth daily as needed (constipation).     . meclizine (ANTIVERT) 25 MG tablet Take 1 tablet (25 mg total) by mouth 3 (three) times daily as needed for dizziness. 3 month supply 270 tablet 3  . metoprolol succinate (TOPROL-XL) 50 MG 24 hr tablet Take 1 tablet (50 mg total) by mouth daily with breakfast. 30 tablet 3  . Misc Natural Products (TART CHERRY ADVANCED PO) Take 5 mLs by mouth daily. Juice Concentrate- 1tsp daily    . morphine (MS CONTIN) 30 MG 12 hr tablet Take 15 mg by mouth 2 (two) times daily.     Marland Kitchen morphine (MSIR) 15 MG tablet Take 15 mg by mouth every 4 (four) hours as needed for severe pain.    . Multiple Vitamins-Minerals (CENTRUM SILVER) tablet Take 1 tablet by mouth daily.    Marland Kitchen  nystatin (MYCOSTATIN) 100000 UNIT/ML suspension Take 5 mLs (500,000 Units total) by mouth 4 (four) times daily. 60 mL 0  . Omega-3 Fatty Acids (FISH OIL) 1200 MG CAPS Take 2,400 mg by mouth 2 (two) times daily.    . OXYGEN Inhale 2.5 L/hr into the lungs continuous.    . pantoprazole (PROTONIX) 40 MG tablet TAKE 1 TABLET (40 MG TOTAL) BY MOUTH DAILY. 90 tablet 3  . Polyethyl Glycol-Propyl Glycol (SYSTANE) 0.4-0.3 % SOLN Apply to eye.    . pravastatin (PRAVACHOL) 40 MG tablet Take 1 tablet (40 mg total) by mouth at bedtime. 90 tablet 3  . promethazine (PHENERGAN) 25 MG tablet Take 25 mg by mouth every 8 (eight) hours as needed.   1  . Respiratory Therapy Supplies (NEBULIZER) DEVI Use as directed 1 each 0  . riboflavin (VITAMIN B-2) 100 MG TABS Take 200 mg by mouth 2 (two) times daily before a meal.    . Turmeric Curcumin 500 MG CAPS Take 1 capsule by mouth daily.    Marland Kitchen ZOVIRAX 5 % Apply 1 application topically as needed (flair).      No current facility-administered medications on file prior to visit.

## 2016-04-17 NOTE — Telephone Encounter (Signed)
Called, spoke with pt.  Discussed below recs per CY.  Pt verbalized understanding and aware rx sent to [facility].  Pt to call back if symptoms do not improve or worsen.

## 2016-05-06 ENCOUNTER — Telehealth: Payer: Self-pay | Admitting: Family Medicine

## 2016-05-06 NOTE — Telephone Encounter (Signed)
Call pt, She has not been seen since Oct of last year. She is establishing with a new provider and her last script lasted 90 days. So I will give her a script for 30 tabs only, locally. Her new physician can send in further meds if they are going to continue her on the medication

## 2016-05-06 NOTE — Telephone Encounter (Signed)
Patient is calling to get lorazepam sent [facility],  [facility] 30 day supply [place]  Because she is going to run out  90 day  Supply to [facility] after that

## 2016-05-06 NOTE — Telephone Encounter (Signed)
Lorazepam refill request,  last filled 02/16/2016.  Patient is scheduled to see Dr Danise Mina as a new patient in July.  Please advise.

## 2016-05-07 ENCOUNTER — Telehealth: Payer: Self-pay | Admitting: *Deleted

## 2016-05-07 ENCOUNTER — Encounter: Payer: Self-pay | Admitting: *Deleted

## 2016-05-07 ENCOUNTER — Other Ambulatory Visit: Payer: Self-pay | Admitting: *Deleted

## 2016-05-07 MED ORDER — LORAZEPAM 1 MG PO TABS: 1.0000 mg | ORAL_TABLET | Freq: Two times a day (BID) | ORAL | Status: DC

## 2016-05-07 NOTE — Telephone Encounter (Signed)
This encounter was created in error - please disregard.

## 2016-05-07 NOTE — Telephone Encounter (Signed)
Left message for patient to call us so we can inform her we will fill lorazepam locally but she will have to get refills from her new provider.

## 2016-05-07 NOTE — Telephone Encounter (Signed)
Patient husband returned call and made aware.

## 2016-05-07 NOTE — Telephone Encounter (Signed)
Patient's husband left a voicemail stating that his wife has a new patient appointment scheduled with Dr. Darnell Level. June 11, 2016 and they need a refill on Lorazepam.  Called patient back and advised her that refills will need to come from her current doctor until patient is actually seen in the office and has established care with Dr. Darnell Level. Advised patient that if she can not get refills from her current PCP she will need to go to an urgent care for this.

## 2016-05-13 ENCOUNTER — Ambulatory Visit: Payer: Medicare Other | Admitting: Internal Medicine

## 2016-05-22 ENCOUNTER — Telehealth: Payer: Self-pay | Admitting: Family Medicine

## 2016-05-22 NOTE — Telephone Encounter (Signed)
I called Mr. Natalie Hendrix back to advise him that the pharmacy had been contacted and we corrected the prescription. He again told me that he wasn't trying to cause any problems that if he was he would contact [facility] since they owned our practice. I again apologize to patient again and told him I would address the issue. He states that he more than likely wouldn't pick up the other medication since Dr. Marin Olp called in a 8 day supply to last until the [place] sends out a 90 day supply.

## 2016-05-22 NOTE — Telephone Encounter (Signed)
Natalie Hendrix called in this afternoon to file a complaint about our office. He states he called on Monday and spoke with someone who was very rude to him. He states that he was following up on a call that he had placed on 05/06/16 for patients Lorazepam. He states that whoever he talked to told him that a 60 day supply was called into [facility] for the patient. (this is coming from the med list) The patient states that he has talked with the gentleman pharmacy at [facility] and was showed that we only called in a 30 tabs. I advised patient that per the note from 05/06/16 that states patient has not been seen since Oct last year and she has a new patient appt coming up to call in 30 tabs. He asked who was Dr. Buelah Manis and I told him that she was another physician here that was covering Dr. Samella Parr refill request in his absence. He told me that she shouldn't have changed the way a prescription was written that was against the law. I advised him that Dr. Buelah Manis could change a prescription based on the information she was given and that she was covering for Dr. Dennard Schaumann. He also states that someone was also rude to [name] when they called in to see about his overpayment to our office. I apologized to the patient that someone in the office was rude to him. He kept saying "I really don't want to have to file a complaint with [facility] or Medicare" I assured the patient that I would handle the situation.  He also requested a letter from Dr. Dennard Schaumann or myself stating that the issue had been handled and that was the reason him and his wife were changing providers. However if you look back at the appt desk she called in Feb to get a NP appt. I advised him I wouldn't send a letter and that I was sorry that he was treated rudely.   I spoke with [name] to see if she could help me understand the prescription that was called in since the pharmacist showed the patient that it was only called in for 30 tabs.  [name] went ahead and called  [facility] and she corrected the prescription.

## 2016-05-23 NOTE — Telephone Encounter (Signed)
Situation noted.

## 2016-05-30 ENCOUNTER — Ambulatory Visit (HOSPITAL_BASED_OUTPATIENT_CLINIC_OR_DEPARTMENT_OTHER): Payer: Medicare Other | Admitting: Hematology & Oncology

## 2016-05-30 ENCOUNTER — Encounter: Payer: Self-pay | Admitting: Hematology & Oncology

## 2016-05-30 ENCOUNTER — Other Ambulatory Visit (HOSPITAL_BASED_OUTPATIENT_CLINIC_OR_DEPARTMENT_OTHER): Payer: Medicare Other

## 2016-05-30 ENCOUNTER — Telehealth: Payer: Self-pay | Admitting: Neurology

## 2016-05-30 VITALS — BP 122/47 | HR 82 | Temp 99.1°F | Resp 20 | Ht 67.0 in | Wt 192.0 lb

## 2016-05-30 DIAGNOSIS — D45 Polycythemia vera: Secondary | ICD-10-CM | POA: Diagnosis not present

## 2016-05-30 LAB — COMPREHENSIVE METABOLIC PANEL (CC13)
A/G RATIO: 1.7 (ref 1.2–2.2)
ALBUMIN: 4 g/dL (ref 3.6–4.8)
ALT: 21 IU/L (ref 0–32)
AST (SGOT): 24 IU/L (ref 0–40)
Alkaline Phosphatase, S: 74 IU/L (ref 39–117)
BILIRUBIN TOTAL: 0.3 mg/dL (ref 0.0–1.2)
BUN / CREAT RATIO: 20 (ref 12–28)
BUN: 14 mg/dL (ref 8–27)
CHLORIDE: 104 mmol/L (ref 96–106)
Calcium, Ser: 9.6 mg/dL (ref 8.7–10.3)
Carbon Dioxide, Total: 25 mmol/L (ref 18–29)
Creatinine, Ser: 0.7 mg/dL (ref 0.57–1.00)
GFR calc non Af Amer: 91 mL/min/{1.73_m2} (ref >59)
GFR, EST AFRICAN AMERICAN: 105 mL/min/{1.73_m2} (ref >59)
GLOBULIN, TOTAL: 2.3 g/dL (ref 1.5–4.5)
GLUCOSE: 86 mg/dL (ref 65–99)
Potassium, Ser: 4.7 mmol/L (ref 3.5–5.2)
SODIUM: 136 mmol/L (ref 134–144)
TOTAL PROTEIN: 6.3 g/dL (ref 6.0–8.5)

## 2016-05-30 LAB — CBC WITH DIFFERENTIAL (CANCER CENTER ONLY)
BASO#: 0 10*3/uL (ref 0.0–0.2)
BASO%: 0.3 % (ref 0.0–2.0)
EOS ABS: 0.1 10*3/uL (ref 0.0–0.5)
EOS%: 2 % (ref 0.0–7.0)
HEMATOCRIT: 37.3 % (ref 34.8–46.6)
HEMOGLOBIN: 11.8 g/dL (ref 11.6–15.9)
LYMPH#: 1.6 10*3/uL (ref 0.9–3.3)
LYMPH%: 23.4 % (ref 14.0–48.0)
MCH: 23 pg — AB (ref 26.0–34.0)
MCHC: 31.6 g/dL — AB (ref 32.0–36.0)
MCV: 73 fL — AB (ref 81–101)
MONO#: 0.6 10*3/uL (ref 0.1–0.9)
MONO%: 8.2 % (ref 0.0–13.0)
NEUT%: 66.1 % (ref 39.6–80.0)
NEUTROS ABS: 4.6 10*3/uL (ref 1.5–6.5)
Platelets: 278 10*3/uL (ref 145–400)
RBC: 5.14 10*6/uL (ref 3.70–5.32)
RDW: 16.5 % — ABNORMAL HIGH (ref 11.1–15.7)
WBC: 7 10*3/uL (ref 3.9–10.0)

## 2016-05-30 NOTE — Telephone Encounter (Signed)
Natalie Hendrix 2051-06-20. She called yesterday 05/29/16  requesting to see Dr. Carles Collet. She is a previous patient of Dr. Tomi Likens (2015). I put a request/note back to Dr. Carles Collet and Dr. Tomi Likens. Dr. Carles Collet did say that she could not see the patient. I called the patient back on 05/30/16 to let her know. Her husband answered and was very angry about the situation. I informed him that he could speak with the nurse and that we wanted to help try to get her an appointment with Dr. Tomi Likens. He stated he didn't want to talk to the nurse. I did talk to [facility] Dr. Georgie Chard nurse. I explained everything to her as well.  Theadora Rama

## 2016-05-30 NOTE — Progress Notes (Signed)
Hematology and Oncology Follow Up Visit  Natalie Hendrix [id number] 08-22-1951 65 y.o. 05/30/2016   Principle Diagnosis:   Polycythemia vera- JAK2 negative  Current Therapy:    Phlebotomy to maintain hematocrit less than 38%  Plavix 75 mg by mouth daily     Interim History:  Ms.  Hendrix is back for followup. She looks good. According to her husband, she fell a few weeks ago. She fell down 4 steps. Thankfully, she did not break anything. She went to see orthopedic surgery. Unfortunately, they were not too impressed with the doctor. They do not want to go back to see him.  She is wearing special physio tape now. This is really getting her a little more mobile. She says it makes her body feel good. It helps stretch her muscles.  She still wears oxygen. She is on chronic oxygen. She has underlying pulmonary disease.  It is still to follow him with her grandson's death. He passed away back in 11/04/23. He was hit by car.  She has had no bleeding. She has had no fever. Her husband comes in with a facemask on as he is trying to deal with viral bronchitis.  As always, we talked about jewelry. They know a Master gem cutter and they are always trying to get into by jewelry from him for my wife..  She is clearly iron deficient. This has not been a problem.  Overall, her performance status is ECOG 2.   Medications:  Current outpatient prescriptions:  .  albuterol (PROAIR HFA) 108 (90 Base) MCG/ACT inhaler, Inhale 2 puffs into the lungs every 6 (six) hours as needed for wheezing or shortness of breath., Disp: 3 Inhaler, Rfl: 3 .  albuterol (PROVENTIL) (2.5 MG/3ML) 0.083% nebulizer solution, Take 3 mLs (2.5 mg total) by nebulization every 6 (six) hours as needed for wheezing or shortness of breath., Disp: 360 mL, Rfl: 3 .  Alpha Lipoic Acid 200 MG CAPS, Take by mouth every morning. , Disp: , Rfl:  .  Alum & Mag Hydroxide-Simeth (MAGIC MOUTHWASH W/LIDOCAINE) SOLN, Take 5 mLs by mouth 4 (four)  times daily., Disp: 240 mL, Rfl: 3 .  amoxicillin-clavulanate (AUGMENTIN) 875-125 MG tablet, Take 1 tablet by mouth 2 (two) times daily., Disp: 14 tablet, Rfl: 0 .  benzonatate (TESSALON) 100 MG capsule, Take 100 mg by mouth every 6 (six) hours as needed for cough., Disp: , Rfl:  .  buPROPion (WELLBUTRIN XL) 300 MG 24 hr tablet, Take 1 tablet (300 mg total) by mouth daily with breakfast., Disp: 90 tablet, Rfl: 3 .  Calcium Carbonate-Vit D-Min (CALCIUM 1200 PO), Take 2,400 mg by mouth daily. , Disp: , Rfl:  .  clopidogrel (PLAVIX) 75 MG tablet, Take 1 tablet (75 mg total) by mouth daily., Disp: 90 tablet, Rfl: 3 .  co-enzyme Q-10 30 MG capsule, Take 30 mg by mouth daily. , Disp: , Rfl:  .  cyclobenzaprine (FLEXERIL) 10 MG tablet, Take 10 mg by mouth 3 (three) times daily as needed for muscle spasms. , Disp: , Rfl:  .  cyclobenzaprine (FLEXERIL) 5 MG tablet, , Disp: , Rfl: 0 .  diclofenac (FLECTOR) 1.3 % PTCH, Place 1 patch onto the skin 2 (two) times daily., Disp: , Rfl:  .  ESTRACE VAGINAL 0.1 MG/GM vaginal cream, Place 1 Applicatorful vaginally daily. , Disp: , Rfl:  .  ezetimibe (ZETIA) 10 MG tablet, TAKE 1 TABLET BY MOUTH DAILY., Disp: 90 tablet, Rfl: 3 .  Flaxseed, Linseed, (FLAXSEED OIL) 1200  MG CAPS, Take 1 capsule by mouth 1 day or 1 dose. , Disp: , Rfl:  .  Grape Seed Extract 30 MG CAPS, Take 1 capsule by mouth every morning. , Disp: , Rfl:  .  lidocaine (LIDODERM) 5 %, Place 1 patch onto the skin as needed (pain). Remove & Discard patch within 12 hours or as directed by MD, Disp: , Rfl:  .  LORazepam (ATIVAN) 1 MG tablet, Take 1 tablet (1 mg total) by mouth 2 (two) times daily., Disp: 60 tablet, Rfl: 0 .  magnesium gluconate (MAGONATE) 500 MG tablet, Take 500 mg by mouth daily as needed (constipation). , Disp: , Rfl:  .  meclizine (ANTIVERT) 25 MG tablet, Take 1 tablet (25 mg total) by mouth 3 (three) times daily as needed for dizziness. 3 month supply, Disp: 270 tablet, Rfl: 3 .   metoprolol succinate (TOPROL-XL) 50 MG 24 hr tablet, Take 1 tablet (50 mg total) by mouth daily with breakfast., Disp: 30 tablet, Rfl: 3 .  Misc Natural Products (TART CHERRY ADVANCED PO), Take 5 mLs by mouth daily. Juice Concentrate- 1tsp daily, Disp: , Rfl:  .  morphine (MS CONTIN) 30 MG 12 hr tablet, Take 15 mg by mouth 2 (two) times daily. , Disp: , Rfl:  .  morphine (MSIR) 15 MG tablet, Take 15 mg by mouth every 4 (four) hours as needed for severe pain., Disp: , Rfl:  .  Multiple Vitamins-Minerals (CENTRUM SILVER) tablet, Take 1 tablet by mouth daily., Disp: , Rfl:  .  nystatin (MYCOSTATIN) 100000 UNIT/ML suspension, Take 5 mLs (500,000 Units total) by mouth 4 (four) times daily., Disp: 60 mL, Rfl: 0 .  Omega-3 Fatty Acids (FISH OIL) 1200 MG CAPS, Take 2,400 mg by mouth 2 (two) times daily., Disp: , Rfl:  .  OXYGEN, Inhale 2.5 L/hr into the lungs continuous., Disp: , Rfl:  .  pantoprazole (PROTONIX) 40 MG tablet, TAKE 1 TABLET (40 MG TOTAL) BY MOUTH DAILY., Disp: 90 tablet, Rfl: 3 .  Polyethyl Glycol-Propyl Glycol (SYSTANE) 0.4-0.3 % SOLN, Apply to eye., Disp: , Rfl:  .  pravastatin (PRAVACHOL) 40 MG tablet, Take 1 tablet (40 mg total) by mouth at bedtime., Disp: 90 tablet, Rfl: 3 .  promethazine (PHENERGAN) 25 MG tablet, Take 25 mg by mouth every 8 (eight) hours as needed. , Disp: , Rfl: 1 .  Respiratory Therapy Supplies (NEBULIZER) DEVI, Use as directed, Disp: 1 each, Rfl: 0 .  riboflavin (VITAMIN B-2) 100 MG TABS, Take 200 mg by mouth 2 (two) times daily before a meal., Disp: , Rfl:  .  Turmeric Curcumin 500 MG CAPS, Take 1 capsule by mouth daily., Disp: , Rfl:  .  ZOVIRAX 5 %, Apply 1 application topically as needed (flair). , Disp: , Rfl:  .  azelastine (ASTELIN) 0.1 % nasal spray, Place 1 spray into both nostrils 2 (two) times daily. Use in each nostril as directed, Disp: 90 mL, Rfl: 3 .  beclomethasone (BECONASE-AQ) 42 MCG/SPRAY nasal spray, Place 2 sprays into both nostrils 2 (two)  times daily. Dose is for each nostril., Disp: 75 g, Rfl: 3 .  fexofenadine (ALLEGRA) 180 MG tablet, Take 1 tablet (180 mg total) by mouth daily as needed for allergies. As needed, Disp: 90 tablet, Rfl: 3  Allergies:  Allergies  Allergen Reactions  . Qvar [Beclomethasone]   . Spiriva [Tiotropium Bromide Monohydrate]     rash  . Chlorzoxazone   . Epinephrine     Raises heart rate  .  Lamotrigine   . Levofloxacin   . Neurontin [Gabapentin]     arthralgia  . Protriptyline Hcl     Severe constipation  . Ropinirole Hcl     arthralgia  . Tizanidine     panic  . Verapamil   . Zonegran     Mood swings  . Advair Diskus [Fluticasone-Salmeterol] Rash  . Baclofen Rash  . Chlorzoxazone Rash  . Levofloxacin Rash    Past Medical History, Surgical history, Social history, and Family History were reviewed and updated.  Review of Systems: As above  Physical Exam:  height is 5\' 7"  (1.702 m) and weight is 192 lb (87.091 kg). Her oral temperature is 99.1 F (37.3 C). Her blood pressure is 122/47 and her pulse is 82. Her respiration is 20.   Well-developed and well-nourished white female. Head and neck exam shows Dr. or oral lesions. She has no palpable cervical or supraclavicular lymph nodes. Lungs are clear bilaterally. Cardiac exam regular in rhythm with no murmurs rubs or bruits. Abdomen is soft. She has good bowel sounds. There is no fluid wave. There is no palpable liver spleen tip. Back exam shows no tenderness over the spine ribs or hips. Extremities shows no clubbing, cyanosis or edema. She has no rashes. Neurological exam is nonfocal.     Lab Results  Component Value Date   WBC 7.0 05/30/2016   HGB 11.8 05/30/2016   HCT 37.3 05/30/2016   MCV 73* 05/30/2016   PLT 278 05/30/2016     Chemistry      Component Value Date/Time   NA 140 02/28/2016 1325   NA 140 04/27/2015 1516   NA 146* 10/17/2014 1413   K 4.5 02/28/2016 1325   K 4.5 04/27/2015 1516   K 4.3 10/17/2014 1413   CL  106 04/27/2015 1516   CL 102 10/17/2014 1413   CO2 26 02/28/2016 1325   CO2 26 04/27/2015 1516   CO2 25 10/17/2014 1413   BUN 15.2 02/28/2016 1325   BUN 13 04/27/2015 1516   BUN 14 10/17/2014 1413   CREATININE 0.7 02/28/2016 1325   CREATININE 0.83 04/27/2015 1516   CREATININE 0.7 10/17/2014 1413      Component Value Date/Time   CALCIUM 8.7 02/28/2016 1325   CALCIUM 9.5 04/27/2015 1516   CALCIUM 9.3 10/17/2014 1413   ALKPHOS 60 02/28/2016 1325   ALKPHOS 57 04/27/2015 1516   ALKPHOS 54 10/17/2014 1413   AST 22 02/28/2016 1325   AST 28 04/27/2015 1516   AST 34 10/17/2014 1413   ALT 18 02/28/2016 1325   ALT 24 04/27/2015 1516   ALT 33 10/17/2014 1413   BILITOT <0.30 02/28/2016 1325   BILITOT 0.4 04/27/2015 1516   BILITOT 0.70 10/17/2014 1413         Impression and Plan: Natalie Hendrix is 65 year-old female with polycythemia. Thankfully, she has not had any neurological issues from this for a long time.  I'm not sure as to why she fell.  She does not need a phlebotomy.  As always, she and her husband are very fascinating to talk with.  I'm glad that the physio tape that she is using is helping her.  I will plan see her back in 2 months.  I spent about 30 minutes with she and her husband today.     [name], MD 6/29/20173:29 PM

## 2016-05-31 LAB — IRON AND TIBC
%SAT: 9 % — AB (ref 21–57)
IRON: 40 ug/dL — AB (ref 41–142)
TIBC: 464 ug/dL — AB (ref 236–444)
UIBC: 424 ug/dL — AB (ref 120–384)

## 2016-05-31 LAB — FERRITIN: FERRITIN: 6 ng/mL — AB (ref 9–269)

## 2016-06-10 ENCOUNTER — Ambulatory Visit (INDEPENDENT_AMBULATORY_CARE_PROVIDER_SITE_OTHER): Payer: Medicare Other | Admitting: Internal Medicine

## 2016-06-10 ENCOUNTER — Encounter: Payer: Self-pay | Admitting: Internal Medicine

## 2016-06-10 VITALS — BP 124/74 | HR 86 | Wt 191.4 lb

## 2016-06-10 DIAGNOSIS — F331 Major depressive disorder, recurrent, moderate: Secondary | ICD-10-CM

## 2016-06-10 DIAGNOSIS — J45998 Other asthma: Secondary | ICD-10-CM

## 2016-06-10 DIAGNOSIS — J9611 Chronic respiratory failure with hypoxia: Secondary | ICD-10-CM

## 2016-06-10 NOTE — Progress Notes (Signed)
04/20/13- 22 yoF never smoker, asthma and history of pneumonia with cough, complicated by past history surgery for thoracic outlet syndrome, CVA, allergic rhinitis, polycythemia. Husband here    PCP Dr Jacelyn Grip   Allergy profile was NEG, 4.9 total IgE Bronchitis with productive cough after an afternoon outdoors. She stayed indoors the next day and is better now. Continues oxygen 2-3 L/[facility]. Medications reviewed.these  10/19/13- 62 yoF never smoker, asthma and history of pneumonia with cough, complicated by past history surgery for thoracic outlet syndrome, CVA, GERD, allergic rhinitis, polycythemia. Husband here    PCP Dr Berline Chough for- c/o wheezing, SOB with exertion, hoarseness, non prod cough. Little phlegm.  Nl PFT 2012 except DLCO 62% Dtr in [place] dying of leukemia. Pt is on O2 2-3L/ [facility].  ENT/ [facility] referred her to GI/ Dr Paulita Fujita: Ba swallow> stricture and probably mild aspiration  01/21/14-  62 yoF never smoker, asthma and history of pneumonia with cough, complicated by past history surgery for thoracic outlet syndrome, CVA, GERD, allergic rhinitis, polycythemia, depression. Husband here    PCP Dr Jacelyn Grip    ACUTE VISIT: increased cough; getting worse. Losing her voice. Woke up with laryngitis/bronchitis, coughing green. Tussive soreness upper trachea area. No definite fever. Had dysphagia on swallowing evaluation. GI/ Dr Paulita Fujita. Continues oxygen 2.5L/ [facility] Swallowing eval- Dysphagia III  04/19/14-63 yoF never smoker, asthma and history of pneumonia with cough, complicated by past history surgery for thoracic outlet syndrome, CVA, GERD, allergic rhinitis, polycythemia, depression. Husband here    PCP Dr Jacelyn Grip  FOLLOWS FOR: Pt states she has done well with the change in the weather. The new O2 tank she has is working better for her. Using home ionizer air cleaner Seeing GI for GERD  01/18/15-63 yoF never smoker, asthma and history of pneumonia with cough, complicated by  past history surgery for thoracic outlet syndrome, CVA, GERD, allergic rhinitis, polycythemia, depression. Husband here     FOLLOWS FOR: Pt states she is doing well overall and continues wearing her O2 2.5L/ [facility]-would like to change to simple go mini tank-will need order for [facility]. CT chest 10/24/14 FINDINGS: CT CHEST FINDINGS- reviewed There is heterogeneity and sub cm nodularity in the right lobe of the thyroid. No pathologically enlarged mediastinal, hilar or axillary lymph nodes. Surgical clips are seen in the right supraclavicular fossa. Atherosclerotic calcification of the arterial vasculature. Heart size normal. No pericardial effusion. Chronic appearing volume loss is seen in the right lower lobe, adjacent to a chronically elevated right hemidiaphragm. Dependent atelectasis bilaterally. Lungs are otherwise clear. No pleural fluid. Airway is unremarkable.  07/13/15- 64 yoF never smoker, asthma and history of pneumonia with cough, complicated by past history surgery for thoracic outlet syndrome, CVA, GERD, allergic rhinitis, polycythemia, depression. Husband here   FOLLOWS FOR: Pt states she is having left sinus issues-? inflammation or infection-has left sided pain on face and around tooth-gets better once drains. O2 2.5 L/ [facility] Dentist did x-ray and told her she had sinus infection. She continues using oxygen and feels she needs it. Describes very easy dyspnea on exertion on room air relieved by oxygen  11/13/2015-65 year old female never smoker followed for asthma, history pneumonia, cough, complicated by past history surgery for thoracic outlet syndrome, CVA, GERD, allergic rhinitis, polycythemia, depression O2 2 L sleep, and prn/[facility]   husband here She refuses flu shot Using her oxygen more as the time and recognizes that it helps especially with mild exertion and activity outdoors. Feels well at today's visit  with no acute issue.  06/10/2016-65 year old female never  smoker followed for asthma, history pneumonia, cough, complicated by past history for thoracic outlet syndrome, CVA, GERD, allergic rhinitis, polycythemia, depression O2 2 L sleep and prn/[facility]  FOLLOWS FOR: Pt states she recently fell-during that time she increased her O2 to 3lpm QHS. Otherwise doing well. No routine cough. Occasional mild wheeze. Using either rescue inhaler or nebulizer about once a day now, blaming hot weather. Continues oxygen for sleep and if needed during the daytime. Today's anniversary of son's suicide-survivor guilt soldier in [place]. She is trying to cope with that, help her husband with his chronic illness and other problems.  Review of Systems- See HPI Constitutional:   No-   weight loss, night sweats, fevers, chills, fatigue, lassitude. HEENT:   frequent  headaches,  Some difficulty swallowing,, sore throat,       No-  sneezing, itching, ear ache, +nasal congestion, post nasal drip,  CV:  No-   chest pain, orthopnea, PND, swelling in lower extremities, anasarca, dizziness, palpitations Resp: + shortness of breath with exertion or at rest. productive cough              No-  coughing up of blood.              No-  change in color of mucus.  + wheezing.   Skin: No-   rash or lesions. GI:  No-   heartburn, indigestion, abdominal pain, nausea, vomiting, GU:  MS:  No-   joint pain or swelling.   Neuro- nothing new or unusual  Psych:  No- change in mood or affect. No depression or anxiety.  No memory loss.    Objective:   Physical Exam  General- Alert, Oriented, Affect-appropriate, Distress- none acute,  Overweight.                Brought portable O2 but on room air, rolling walker Skin- rash-none, lesions- none, excoriation- none Lymphadenopathy- none Head- atraumatic            Eyes- Gross vision intact, PERRLA, conjunctivae clear secretions            Ears- Hearing, canals            Nose- Clear, No- Septal dev, mucus, polyps, erosion, perforation              Throat- Mallampati III , mucosa clear , drainage- none, tonsils- atrophic, +hoarse Neck- flexible , trachea midline, no stridor , thyroid nl, carotid no bruit Chest - symmetrical excursion , unlabored           Heart/CV- RRR , no murmur , no gallop  , no rub, nl s1 s2                           - JVD- none , edema- none, stasis changes- none, varices- none           Lung- clear to P&A, wheeze-none, cough-none,                                                    dullness-none, rub- none, unlabored           Chest wall- +Vascular surgery scar Right Upper Anterior chest Abd- Br/ Gen/ Rectal- Not done, not indicated Extrem- cyanosis- none, clubbing,  none, atrophy- none, strength- . +Rolling walker Neuro- + some word searching

## 2016-06-10 NOTE — Patient Instructions (Addendum)
We can continue O2 2L for sleep and as needed  Ok to continue current breathing meds. Please call for refills as needed.

## 2016-06-11 ENCOUNTER — Ambulatory Visit (INDEPENDENT_AMBULATORY_CARE_PROVIDER_SITE_OTHER): Payer: Medicare Other | Admitting: Family Medicine

## 2016-06-11 ENCOUNTER — Encounter: Payer: Self-pay | Admitting: Radiology

## 2016-06-11 ENCOUNTER — Encounter: Payer: Self-pay | Admitting: Family Medicine

## 2016-06-11 VITALS — BP 116/70 | HR 72 | Temp 98.7°F | Ht 67.0 in | Wt 192.0 lb

## 2016-06-11 DIAGNOSIS — F331 Major depressive disorder, recurrent, moderate: Secondary | ICD-10-CM | POA: Diagnosis not present

## 2016-06-11 DIAGNOSIS — E785 Hyperlipidemia, unspecified: Secondary | ICD-10-CM | POA: Diagnosis not present

## 2016-06-11 DIAGNOSIS — I10 Essential (primary) hypertension: Secondary | ICD-10-CM

## 2016-06-11 DIAGNOSIS — J45998 Other asthma: Secondary | ICD-10-CM

## 2016-06-11 DIAGNOSIS — J9611 Chronic respiratory failure with hypoxia: Secondary | ICD-10-CM

## 2016-06-11 DIAGNOSIS — M797 Fibromyalgia: Secondary | ICD-10-CM

## 2016-06-11 DIAGNOSIS — G43009 Migraine without aura, not intractable, without status migrainosus: Secondary | ICD-10-CM

## 2016-06-11 DIAGNOSIS — K219 Gastro-esophageal reflux disease without esophagitis: Secondary | ICD-10-CM

## 2016-06-11 DIAGNOSIS — E669 Obesity, unspecified: Secondary | ICD-10-CM

## 2016-06-11 DIAGNOSIS — Z8679 Personal history of other diseases of the circulatory system: Secondary | ICD-10-CM

## 2016-06-11 DIAGNOSIS — D45 Polycythemia vera: Secondary | ICD-10-CM | POA: Diagnosis not present

## 2016-06-11 DIAGNOSIS — Z8673 Personal history of transient ischemic attack (TIA), and cerebral infarction without residual deficits: Secondary | ICD-10-CM

## 2016-06-11 MED ORDER — LORAZEPAM 1 MG PO TABS: 1.0000 mg | ORAL_TABLET | Freq: Two times a day (BID) | ORAL | Status: DC

## 2016-06-11 NOTE — Progress Notes (Signed)
BP 116/70 mmHg  Pulse 72  Temp(Src) 98.7 F (37.1 C) (Oral)  Ht 5\' 7"  (1.702 m)  Wt 192 lb (87.091 kg)  BMI 30.06 kg/m2  SpO2 93%   CC: new pt to establish care  Subjective:    Patient ID: Natalie Hendrix, female    DOB: [birth year], 65 y.o.   MRN: [id number]  HPI: Natalie Hendrix is a 65 y.o. female presenting on 06/11/2016 for Establish Care   Prior saw Dr Dennard Schaumann. Has service chihuahua.  Known asthma never smoker, 2L O2 dependent at night and PRN exertion, CVA (residual word finding, short term memory), h/o thoracic outlet syndrome, GERD, depression, migraines, fibromyalgia and polycythemia vera followed by Dr Marin Olp. She is on disability for h/o stroke with residual deficits.   CVA (cerebral vascular accident) [facility]) Date: brainstem 1999, ?2014 residual left side weakness, dysphagia, word finding difficulty, and short term memory; uses mobile chair, cane  Would like to establish with counselor and neurologist. Wants to go to visits together with husband. Prior saw Dr Casimiro Needle - stated on lorazepam and wellbutrin by psych. Mainly wants to establish with neurologist due to ongoing balance issues. Uses walker regularly. She has seen Dr Erling Cruz and Dr Loretta Plume and Dr Jannifer Franklin in the past, would like to establish with new neurologist. Monitoring "white spots in brain".   Son remotely killed in [place], daughter from lung cancer. Grandson died 11/23/15 in MVA. No other family nearby. PTSD from this.   Preventative: CPE last year  Natalie Hendrix - pain management (2-3 migraines/wk) Burney Gauze - hematologist Keturah Barre - pulmonologist Dr Erik Obey - ENT Dr Corinna Capra - OBGYN  Relevant past medical, surgical, family and social history reviewed and updated as indicated. Interim medical history since our last visit reviewed. Allergies and medications reviewed and updated. Current Outpatient Prescriptions on File Prior to Visit  Medication Sig  . albuterol (PROAIR HFA) 108 (90 Base) MCG/ACT inhaler  Inhale 2 puffs into the lungs every 6 (six) hours as needed for wheezing or shortness of breath.  Ilean Skill Lipoic Acid 200 MG CAPS Take by mouth every morning.   . Alum & Mag Hydroxide-Simeth (MAGIC MOUTHWASH W/LIDOCAINE) SOLN Take 5 mLs by mouth 4 (four) times daily.  . benzonatate (TESSALON) 100 MG capsule Take 100 mg by mouth every 6 (six) hours as needed for cough.  Marland Kitchen buPROPion (WELLBUTRIN XL) 300 MG 24 hr tablet Take 1 tablet (300 mg total) by mouth daily with breakfast.  . Calcium Carbonate-Vit D-Min (CALCIUM 1200 PO) Take 2,400 mg by mouth daily.   . clopidogrel (PLAVIX) 75 MG tablet Take 1 tablet (75 mg total) by mouth daily.  Marland Kitchen co-enzyme Q-10 30 MG capsule Take 30 mg by mouth daily.   . cyclobenzaprine (FLEXERIL) 5 MG tablet Take 5 mg by mouth 2 (two) times daily as needed. [name]  . diclofenac (FLECTOR) 1.3 % PTCH Place 1 patch onto the skin 2 (two) times daily.  Marland Kitchen ESTRACE VAGINAL 0.1 MG/GM vaginal cream Place 1 Applicatorful vaginally daily.   Marland Kitchen ezetimibe (ZETIA) 10 MG tablet TAKE 1 TABLET BY MOUTH DAILY.  . Grape Seed Extract 30 MG CAPS Take 1 capsule by mouth every morning.   . lidocaine (LIDODERM) 5 % Place 1 patch onto the skin as needed (pain). Remove & Discard patch within 12 hours or as directed by MD  . magnesium gluconate (MAGONATE) 500 MG tablet Take 500 mg by mouth daily as needed (constipation).   . meclizine (ANTIVERT) 25 MG tablet  Take 1 tablet (25 mg total) by mouth 3 (three) times daily as needed for dizziness. 3 month supply  . metoprolol succinate (TOPROL-XL) 50 MG 24 hr tablet Take 1 tablet (50 mg total) by mouth daily with breakfast.  . Misc Natural Products (TART CHERRY ADVANCED PO) Take 5 mLs by mouth daily. Juice Concentrate- 1tsp daily  . morphine (MS CONTIN) 30 MG 12 hr tablet Take 15 mg by mouth 2 (two) times daily.   Marland Kitchen morphine (MSIR) 15 MG tablet Take 15 mg by mouth every 4 (four) hours as needed for severe pain.  . Multiple Vitamins-Minerals (CENTRUM  SILVER) tablet Take 1 tablet by mouth daily.  . Omega-3 Fatty Acids (FISH OIL) 1200 MG CAPS Take 2,400 mg by mouth 2 (two) times daily.  . OXYGEN Inhale 2.5 L/hr into the lungs continuous.  . pantoprazole (PROTONIX) 40 MG tablet TAKE 1 TABLET (40 MG TOTAL) BY MOUTH DAILY.  Vladimir Faster Glycol-Propyl Glycol (SYSTANE) 0.4-0.3 % SOLN Apply to eye.  . pravastatin (PRAVACHOL) 40 MG tablet Take 1 tablet (40 mg total) by mouth at bedtime.  . promethazine (PHENERGAN) 25 MG tablet Take 25 mg by mouth every 8 (eight) hours as needed.   Marland Kitchen Respiratory Therapy Supplies (NEBULIZER) DEVI Use as directed  . riboflavin (VITAMIN B-2) 100 MG TABS Take 200 mg by mouth 2 (two) times daily before a meal.  . Turmeric Curcumin 500 MG CAPS Take 1 capsule by mouth daily.  Marland Kitchen ZOVIRAX 5 % Apply 1 application topically as needed (flair).   Marland Kitchen albuterol (PROVENTIL) (2.5 MG/3ML) 0.083% nebulizer solution Take 3 mLs (2.5 mg total) by nebulization every 6 (six) hours as needed for wheezing or shortness of breath.  Marland Kitchen azelastine (ASTELIN) 0.1 % nasal spray Place 1 spray into both nostrils 2 (two) times daily. Use in each nostril as directed  . beclomethasone (BECONASE-AQ) 42 MCG/SPRAY nasal spray Place 2 sprays into both nostrils 2 (two) times daily. Dose is for each nostril.  . fexofenadine (ALLEGRA) 180 MG tablet Take 1 tablet (180 mg total) by mouth daily as needed for allergies. As needed   No current facility-administered medications on file prior to visit.    Review of Systems Per HPI unless specifically indicated in ROS section     Objective:    BP 116/70 mmHg  Pulse 72  Temp(Src) 98.7 F (37.1 C) (Oral)  Ht 5\' 7"  (1.702 m)  Wt 192 lb (87.091 kg)  BMI 30.06 kg/m2  SpO2 93%  Wt Readings from Last 3 Encounters:  06/11/16 192 lb (87.091 kg)  06/10/16 191 lb 6.4 oz (86.818 kg)  05/30/16 192 lb (87.091 kg)    Physical Exam  Constitutional: She appears well-developed and well-nourished. No distress.  Walks with  rollator walker  HENT:  Mouth/Throat: Oropharynx is clear and moist. No oropharyngeal exudate.  Cardiovascular: Normal rate, regular rhythm, normal heart sounds and intact distal pulses.   No murmur heard. Pulmonary/Chest: Effort normal and breath sounds normal. No respiratory distress. She has no wheezes. She has no rales.  Musculoskeletal: She exhibits no edema.  Neurological:  Word finding difficulty evident  Skin: Skin is warm and dry. No rash noted.  Psychiatric: She has a normal mood and affect. Her behavior is normal. Judgment and thought content normal. Cognition and memory are impaired. She is communicative. She exhibits abnormal recent memory.  Stressed speech  Nursing note and vitals reviewed.  Results for orders placed or performed in visit on 05/30/16  Comprehensive metabolic panel  Result Value Ref Range   Glucose 86 65 - 99 mg/dL   BUN 14 8 - 27 mg/dL   Creatinine, Ser 0.70 0.57 - 1.00 mg/dL   GFR calc non Af Amer 91 >59 mL/min/1.73   GFR calc Af Amer 105 >59 mL/min/1.73   BUN/Creatinine Ratio 20 12 - 28   Sodium 136 134 - 144 mmol/L   Potassium, Ser 4.7 3.5 - 5.2 mmol/L   Chloride, Ser 104 96 - 106 mmol/L   Carbon Dioxide, Total 25 18 - 29 mmol/L   Calcium, Ser 9.6 8.7 - 10.3 mg/dL   Total Protein 6.3 6.0 - 8.5 g/dL   Albumin, Serum 4.0 3.6 - 4.8 g/dL   Globulin, Total 2.3 1.5 - 4.5 g/dL   Albumin/Globulin Ratio 1.7 1.2 - 2.2   Bilirubin Total 0.3 0.0 - 1.2 mg/dL   Alkaline Phosphatase, S 74 39 - 117 IU/L   AST (SGOT) 24 0 - 40 IU/L   ALT 21 0 - 32 IU/L      Assessment & Plan:  Over 45 minutes were spent face-to-face with the patient during this encounter and >50% of that time was spent on counseling and coordination of care  Problem List Items Addressed This Visit    Polycythemia vera (HCC) (Chronic)    Continue f/u with onc- appreciate Dr Antonieta Pert care.      Relevant Medications   LORazepam (ATIVAN) 1 MG tablet   HLD (hyperlipidemia)    Chronic.  Continue current regimen of pravastatin and zetia. I suggested patient choose either flax seed or fish oil.      Obesity, Class I, BMI 30-34.9   MDD (major depressive disorder), recurrent episode, moderate (HCC) - Primary    Several recent deaths in the family. Support provided.  Pt interested in return to counselor - states shew as seeing counselor at same time as husband, would like to continue this. Discussed pros and cons of controlled substances and expectations to receive prescription from our office (benzodiazepine). Patient is not to abuse, misuse, divert or use medication other than as prescribed. Patient is not to seek controlled substances from other clinics or multiple pharmacies. Patient is not to use illegal drugs. Discussed recommended short term use of med. Discussed risks of medication including dependence, tolerance, and addiction/abuse potential. Patient will establish or update controlled substance agreement and complete urine drug screen.         Relevant Medications   LORazepam (ATIVAN) 1 MG tablet   Other Relevant Orders   Ambulatory referral to Psychology   Migraine without aura    Several per week, states this is followed by Dr [name].      Essential hypertension    Continue toprol XL 50mg  daily.       Allergic-infective asthma    Appreciate pulm care of patient.       GERD    Continue protonix 40mg  daily.       Fibromyalgia    Worsens chronic pain.       History of CVA (cerebrovascular accident)    Residual neurological deficit since CVA 1999 - unlateral weakness, stuttering/slurring with word finding difficulties - and predominantly sense of imbalance dizziness. Also with dysphagia thought related to stroke, has seen GI s/p MBS. Requests referral to neuro to re establish care. States they were monitoring "white spots" in brain. Unclear CVA story 2014.       Chronic respiratory failure with hypoxia (HCC)    ?asthma related - continue oxygen,  albuterol  PRN.           Follow up plan: Return in about 4 weeks (around 07/09/2016), or as needed, for follow up visit.  Ria Bush, MD

## 2016-06-11 NOTE — Patient Instructions (Addendum)
We will refer you to local counselor and neurologist Dr Melrose Nakayama.  Lorazepam refilled today.  Pass by lab for controlled substance agreement today.  Nice to meet you today.  Return in 1-2 month for medicare wellness visit.

## 2016-06-11 NOTE — Progress Notes (Signed)
Pre visit review using our clinic review tool, if applicable. No additional management support is needed unless otherwise documented below in the visit note. 

## 2016-06-12 NOTE — Assessment & Plan Note (Signed)
Worsens chronic pain.

## 2016-06-12 NOTE — Assessment & Plan Note (Signed)
Residual neurological deficit since CVA 1999 - unlateral weakness, stuttering/slurring with word finding difficulties - and predominantly sense of imbalance dizziness. Also with dysphagia thought related to stroke, has seen GI s/p MBS. Requests referral to neuro to re establish care. States they were monitoring "white spots" in brain. Unclear CVA story 2014.

## 2016-06-12 NOTE — Assessment & Plan Note (Signed)
Appreciate pulm care of patient.

## 2016-06-12 NOTE — Assessment & Plan Note (Signed)
She continues dependent on oxygen especially for sleep. Questions answered. Usage seems appropriate.

## 2016-06-12 NOTE — Assessment & Plan Note (Addendum)
?  asthma related - continue oxygen, albuterol PRN.

## 2016-06-12 NOTE — Assessment & Plan Note (Signed)
Continue f/u with onc- appreciate Dr Antonieta Pert care.

## 2016-06-12 NOTE — Assessment & Plan Note (Signed)
Continue toprol XL 50mg  daily.

## 2016-06-12 NOTE — Assessment & Plan Note (Signed)
Continue protonix 40mg daily.  

## 2016-06-12 NOTE — Assessment & Plan Note (Addendum)
Several recent deaths in the family. Support provided.  Pt interested in return to counselor - states shew as seeing counselor at same time as husband, would like to continue this. Discussed pros and cons of controlled substances and expectations to receive prescription from our office (benzodiazepine). Patient is not to abuse, misuse, divert or use medication other than as prescribed. Patient is not to seek controlled substances from other clinics or multiple pharmacies. Patient is not to use illegal drugs. Discussed recommended short term use of med. Discussed risks of medication including dependence, tolerance, and addiction/abuse potential. Patient will establish or update controlled substance agreement and complete urine drug screen.

## 2016-06-12 NOTE — Assessment & Plan Note (Signed)
Trying to cope with anniversary of son's death. She said it helped her to talk about it today.

## 2016-06-12 NOTE — Assessment & Plan Note (Addendum)
Chronic. Continue current regimen of pravastatin and zetia. I suggested patient choose either flax seed or fish oil.

## 2016-06-12 NOTE — Assessment & Plan Note (Signed)
Mild intermittent bronchospasm, well controlled, uncomplicated

## 2016-06-12 NOTE — Assessment & Plan Note (Signed)
Several per week, states this is followed by Dr [name].

## 2016-06-27 ENCOUNTER — Encounter: Payer: Self-pay | Admitting: Family Medicine

## 2016-07-04 ENCOUNTER — Telehealth: Payer: Self-pay

## 2016-07-04 NOTE — Telephone Encounter (Signed)
Message left advising Kylie of same.

## 2016-07-04 NOTE — Telephone Encounter (Signed)
Agree - I will do benzo controlled substance and pain management will do narcotics.

## 2016-07-04 NOTE — Telephone Encounter (Signed)
Kylie from [facility] said pt saw Dr Eulas Post today about pain mgt. Pt is confused since 06/11/16 visit with Dr Darnell Level when pt had to sign a controlled substance contract pt thinks Dr Darnell Level is going to fill all meds including pain med. Dr Eulas Post wanted Kylie to call to verify that Dr Darnell Level would take care of lorazepam and any benzos and Dr Eulas Post would continue pain mgt meds. Kylie request cb.

## 2016-07-17 ENCOUNTER — Ambulatory Visit: Payer: Medicare Other | Admitting: Psychology

## 2016-07-23 ENCOUNTER — Telehealth: Payer: Self-pay | Admitting: Family

## 2016-07-23 NOTE — Telephone Encounter (Signed)
I spoke with Mr. Natalie Hendrix, returning a phone call to his wife. She would like to move up her appointment to this week. She is feeling like she needs a phlebotomy and is symptomatic with a headache. We will get this taken care of for her. New POF was sent to [facility]

## 2016-07-26 ENCOUNTER — Encounter: Payer: Self-pay | Admitting: Family

## 2016-07-26 ENCOUNTER — Ambulatory Visit (HOSPITAL_BASED_OUTPATIENT_CLINIC_OR_DEPARTMENT_OTHER): Payer: Medicare Other

## 2016-07-26 ENCOUNTER — Other Ambulatory Visit (HOSPITAL_BASED_OUTPATIENT_CLINIC_OR_DEPARTMENT_OTHER): Payer: Medicare Other

## 2016-07-26 ENCOUNTER — Ambulatory Visit (HOSPITAL_BASED_OUTPATIENT_CLINIC_OR_DEPARTMENT_OTHER): Payer: Medicare Other | Admitting: Family

## 2016-07-26 VITALS — BP 136/70 | HR 79 | Temp 98.0°F | Resp 20 | Ht 67.0 in | Wt 190.0 lb

## 2016-07-26 VITALS — BP 118/67 | HR 68

## 2016-07-26 DIAGNOSIS — D45 Polycythemia vera: Secondary | ICD-10-CM

## 2016-07-26 DIAGNOSIS — K589 Irritable bowel syndrome without diarrhea: Secondary | ICD-10-CM

## 2016-07-26 LAB — COMPREHENSIVE METABOLIC PANEL
ALT: 19 U/L (ref 0–55)
ANION GAP: 8 meq/L (ref 3–11)
AST: 25 U/L (ref 5–34)
Albumin: 3.7 g/dL (ref 3.5–5.0)
Alkaline Phosphatase: 70 U/L (ref 40–150)
BUN: 11.5 mg/dL (ref 7.0–26.0)
CALCIUM: 9.4 mg/dL (ref 8.4–10.4)
CHLORIDE: 107 meq/L (ref 98–109)
CO2: 26 meq/L (ref 22–29)
Creatinine: 0.8 mg/dL (ref 0.6–1.1)
EGFR: 77 mL/min/{1.73_m2} — ABNORMAL LOW (ref >90)
Glucose: 96 mg/dl (ref 70–140)
POTASSIUM: 4.5 meq/L (ref 3.5–5.1)
Sodium: 141 mEq/L (ref 136–145)
Total Bilirubin: 0.39 mg/dL (ref 0.20–1.20)
Total Protein: 6.7 g/dL (ref 6.4–8.3)

## 2016-07-26 LAB — LACTATE DEHYDROGENASE: LDH: 213 U/L (ref 125–245)

## 2016-07-26 LAB — CBC WITH DIFFERENTIAL (CANCER CENTER ONLY)
BASO#: 0 10*3/uL (ref 0.0–0.2)
BASO%: 0.5 % (ref 0.0–2.0)
EOS ABS: 0.1 10*3/uL (ref 0.0–0.5)
EOS%: 2.4 % (ref 0.0–7.0)
HEMATOCRIT: 37.4 % (ref 34.8–46.6)
HEMOGLOBIN: 11.9 g/dL (ref 11.6–15.9)
LYMPH#: 1.6 10*3/uL (ref 0.9–3.3)
LYMPH%: 27.2 % (ref 14.0–48.0)
MCH: 23.2 pg — AB (ref 26.0–34.0)
MCHC: 31.8 g/dL — AB (ref 32.0–36.0)
MCV: 73 fL — ABNORMAL LOW (ref 81–101)
MONO#: 0.5 10*3/uL (ref 0.1–0.9)
MONO%: 7.8 % (ref 0.0–13.0)
NEUT%: 62.1 % (ref 39.6–80.0)
NEUTROS ABS: 3.6 10*3/uL (ref 1.5–6.5)
Platelets: 268 10*3/uL (ref 145–400)
RBC: 5.14 10*6/uL (ref 3.70–5.32)
RDW: 17.2 % — AB (ref 11.1–15.7)
WBC: 5.8 10*3/uL (ref 3.9–10.0)

## 2016-07-26 MED ORDER — HYOSCYAMINE SULFATE 0.125 MG PO TBDP: 0.1250 mg | ORAL_TABLET | Freq: Two times a day (BID) | ORAL | 0 refills | Status: DC | PRN

## 2016-07-26 MED ORDER — MAGIC MOUTHWASH W/LIDOCAINE: 5.0000 mL | Freq: Four times a day (QID) | ORAL | 3 refills | Status: DC

## 2016-07-26 NOTE — Patient Instructions (Signed)
Therapeutic Phlebotomy, Care After  Refer to this sheet in the next few weeks. These instructions provide you with information about caring for yourself after your procedure. Your health care provider may also give you more specific instructions. Your treatment has been planned according to current medical practices, but problems sometimes occur. Call your health care provider if you have any problems or questions after your procedure.  WHAT TO EXPECT AFTER THE PROCEDURE  After your procedure, it is common to have:   Light-headedness or dizziness. You may feel faint.   Nausea.   Tiredness.  HOME CARE INSTRUCTIONS  Activities   Return to your normal activities as directed by your health care provider. Most people can go back to their normal activities right away.   Avoid strenuous physical activity and heavy lifting or pulling for about 5 hours after the procedure. Do not lift anything that is heavier than 10 lb (4.5 kg).   Athletes should avoid strenuous exercise for at least 12 hours.   Change positions slowly for the remainder of the day. This will help to prevent light-headedness or fainting.   If you feel light-headed, lie down until the feeling goes away.  Eating and Drinking   Be sure to eat well-balanced meals for the next 24 hours.   Drink enough fluid to keep your urine clear or pale yellow.   Avoid drinking alcohol on the day that you had the procedure.  Care of the Needle Insertion Site   Keep your bandage dry. You can remove the bandage after about 5 hours or as directed by your health care provider.   If you have bleeding from the needle insertion site, elevate your arm and press firmly on the site until the bleeding stops.   If you have bruising at the site, apply ice to the area:   Put ice in a plastic bag.   Place a towel between your skin and the bag.   Leave the ice on for 20 minutes, 2-3 times a day for the first 24 hours.   If the swelling does not go away after 24 hours, apply  a warm, moist washcloth to the area for 20 minutes, 2-3 times a day.  General Instructions   Avoid smoking for at least 30 minutes after the procedure.   Keep all follow-up visits as directed by your health care provider. It is important to continue with further therapeutic phlebotomy treatments as directed.  SEEK MEDICAL CARE IF:   You have redness, swelling, or pain at the needle insertion site.   You have fluid, blood, or pus coming from the needle insertion site.   You feel light-headed, dizzy, or nauseated, and the feeling does not go away.   You notice new bruising at the needle insertion site.   You feel weaker than normal.   You have a fever or chills.  SEEK IMMEDIATE MEDICAL CARE IF:   You have severe nausea or vomiting.   You have chest pain.   You have trouble breathing.    This information is not intended to replace advice given to you by your health care provider. Make sure you discuss any questions you have with your health care provider.    Document Released: 04/22/2011 Document Revised: 04/04/2015 Document Reviewed: 11/14/2014  [employer]vier Interactive Patient Education 2016 [employer]

## 2016-07-26 NOTE — Progress Notes (Signed)
Hematology and Oncology Follow Up Visit  Natalie Hendrix [id number] 01-24-1951 65 y.o. 07/26/2016   Principle Diagnosis:  Polycythemia vera- JAK2 negative  Current Therapy:   Phlebotomy to maintain hematocrit less than 38% Plavix 75 mg by mouth daily    Interim History:  Natalie Hendrix is here today for a follow-up. She has been feeling symptomatic with fatigue, migraine, legs itching, joint aches and pains, palpitations and worsening SOB with exertion.  She wears 2L supplemental O2 at night and PRN.  No fever, chills, n/v, cough, rash, chest pain or changes in her bowel habits.  She has had some abdominal cramping and has taken hyoscyamine which helped. She would like this refilled.  She has also had some canker sores in her mouth and would like some magic mouth wash which she has also found to be helpful in the past.  No lymphadenopathy found on exam. No episodes of bleeding while on Plavix.  She has chronic issues with balance since having her stroke in 1999 and has bouts of dizziness due to vertigo. She uses a walker to help her ambulate. No falls or syncopal episodes.  She has an appointment with a new neurologist in [place] later this month.  The neuropathy in her hands and feet has been a little worse. She wears her compression stockings daily.  Her appetite comes and goes but she is staying well hydrated. Her weight is stable.   Medications:    Medication List       Accurate as of 07/26/16  1:59 PM. Always use your most recent med list.          albuterol (2.5 MG/3ML) 0.083% nebulizer solution Commonly known as:  PROVENTIL Take 3 mLs (2.5 mg total) by nebulization every 6 (six) hours as needed for wheezing or shortness of breath.   albuterol 108 (90 Base) MCG/ACT inhaler Commonly known as:  PROAIR HFA Inhale 2 puffs into the lungs every 6 (six) hours as needed for wheezing or shortness of breath.   azelastine 0.1 % nasal spray Commonly known as:  ASTELIN Place 1 spray  into both nostrils 2 (two) times daily. Use in each nostril as directed   beclomethasone 42 MCG/SPRAY nasal spray Commonly known as:  BECONASE-AQ Place 2 sprays into both nostrils 2 (two) times daily. Dose is for each nostril.   benzonatate 100 MG capsule Commonly known as:  TESSALON Take 100 mg by mouth every 6 (six) hours as needed for cough.   buPROPion 300 MG 24 hr tablet Commonly known as:  WELLBUTRIN XL Take 1 tablet (300 mg total) by mouth daily with breakfast.   CALCIUM 1200 PO Take 2,400 mg by mouth daily.   CENTRUM SILVER tablet Take 1 tablet by mouth daily.   clopidogrel 75 MG tablet Commonly known as:  PLAVIX Take 1 tablet (75 mg total) by mouth daily.   co-enzyme Q-10 30 MG capsule Take 30 mg by mouth daily.   cyclobenzaprine 5 MG tablet Commonly known as:  FLEXERIL Take 5 mg by mouth 2 (two) times daily as needed. Phillips   ESTRACE VAGINAL 0.1 MG/GM vaginal cream Generic drug:  estradiol Place 1 Applicatorful vaginally daily.   ezetimibe 10 MG tablet Commonly known as:  ZETIA TAKE 1 TABLET BY MOUTH DAILY.   fexofenadine 180 MG tablet Commonly known as:  ALLEGRA Take 1 tablet (180 mg total) by mouth daily as needed for allergies. As needed   Fish Oil 1200 MG Caps Take 2,400 mg by mouth 2 (  two) times daily.   FLECTOR 1.3 % Ptch Generic drug:  diclofenac Place 1 patch onto the skin 2 (two) times daily.   lidocaine 5 % Commonly known as:  LIDODERM Place 1 patch onto the skin as needed (pain). Remove & Discard patch within 12 hours or as directed by MD   LORazepam 1 MG tablet Commonly known as:  ATIVAN Take 1 tablet (1 mg total) by mouth 2 (two) times daily.   magic mouthwash w/lidocaine Soln Take 5 mLs by mouth 4 (four) times daily.   magnesium gluconate 500 MG tablet Commonly known as:  MAGONATE Take 500 mg by mouth daily as needed (constipation).   meclizine 25 MG tablet Commonly known as:  ANTIVERT Take 1 tablet (25 mg total) by mouth 3  (three) times daily as needed for dizziness. 3 month supply   metoprolol succinate 50 MG 24 hr tablet Commonly known as:  TOPROL-XL Take 1 tablet (50 mg total) by mouth daily with breakfast.   morphine 15 MG tablet Commonly known as:  MSIR Take 15 mg by mouth every 4 (four) hours as needed for severe pain.   morphine 30 MG 12 hr tablet Commonly known as:  MS CONTIN Take 15 mg by mouth 2 (two) times daily.   Nebulizer Devi Use as directed   OXYGEN Inhale 2.5 L/hr into the lungs continuous.   pantoprazole 40 MG tablet Commonly known as:  PROTONIX TAKE 1 TABLET (40 MG TOTAL) BY MOUTH DAILY.   pravastatin 40 MG tablet Commonly known as:  PRAVACHOL Take 1 tablet (40 mg total) by mouth at bedtime.   promethazine 25 MG tablet Commonly known as:  PHENERGAN Take 25 mg by mouth every 8 (eight) hours as needed.   SYSTANE 0.4-0.3 % Soln Generic drug:  Polyethyl Glycol-Propyl Glycol Apply to eye.   TART CHERRY ADVANCED PO Take 5 mLs by mouth daily. Juice Concentrate- 1tsp daily   ZOVIRAX 5 % Generic drug:  acyclovir ointment Apply 1 application topically as needed (flair).       Allergies:  Allergies  Allergen Reactions  . Qvar [Beclomethasone]   . Spiriva [Tiotropium Bromide Monohydrate]     rash  . Chlorzoxazone   . Epinephrine     Raises heart rate  . Lamotrigine   . Levofloxacin   . Neurontin [Gabapentin]     arthralgia  . Protriptyline Hcl     Severe constipation  . Ropinirole Hcl     arthralgia  . Tizanidine     panic  . Verapamil   . Zonegran     Mood swings  . Advair Diskus [Fluticasone-Salmeterol] Rash  . Baclofen Rash  . Chlorzoxazone Rash  . Levofloxacin Rash    Past Medical History, Surgical history, Social history, and Family History were reviewed and updated.  Review of Systems: All other 10 point review of systems is negative.   Physical Exam:  height is 5\' 7"  (1.702 m) and weight is 190 lb (86.2 kg). Her oral temperature is 98 F  (36.7 C). Her blood pressure is 136/70 and her pulse is 79. Her respiration is 20.   Wt Readings from Last 3 Encounters:  07/26/16 190 lb (86.2 kg)  06/11/16 192 lb (87.1 kg)  06/10/16 191 lb 6.4 oz (86.8 kg)    Ocular: Sclerae unicteric, pupils equal, round and reactive to light Ear-nose-throat: Oropharynx clear, dentition fair Lymphatic: No cervical supraclavicular or axillary adenopathy Lungs no rales or rhonchi, good excursion bilaterally Heart regular rate and rhythm, no  murmur appreciated Abd soft, nontender, positive bowel sounds, no palpable liver or spleen tip, no fluid wave MSK no focal spinal tenderness, no joint edema Neuro: non-focal, well-oriented, appropriate affect Breasts: Deferred  Lab Results  Component Value Date   WBC 5.8 07/26/2016   HGB 11.9 07/26/2016   HCT 37.4 07/26/2016   MCV 73 (L) 07/26/2016   PLT 268 07/26/2016   Lab Results  Component Value Date   FERRITIN 6 (L) 05/30/2016   IRON 40 (L) 05/30/2016   TIBC 464 (H) 05/30/2016   UIBC 424 (H) 05/30/2016   IRONPCTSAT 9 (L) 05/30/2016   Lab Results  Component Value Date   RETICCTPCT 1.1 04/27/2015   RBC 5.14 07/26/2016   RETICCTABS 55.1 04/27/2015   No results found for: KPAFRELGTCHN, LAMBDASER, KAPLAMBRATIO No results found for: IGGSERUM, IGA, IGMSERUM No results found for: Odetta Pink, SPEI   Chemistry      Component Value Date/Time   NA 136 05/30/2016 1516   NA 140 02/28/2016 1325   K 4.7 05/30/2016 1516   K 4.5 02/28/2016 1325   CL 104 05/30/2016 1516   CL 102 10/17/2014 1413   CO2 25 05/30/2016 1516   CO2 26 02/28/2016 1325   BUN 14 05/30/2016 1516   BUN 15.2 02/28/2016 1325   CREATININE 0.70 05/30/2016 1516   CREATININE 0.7 02/28/2016 1325      Component Value Date/Time   CALCIUM 9.6 05/30/2016 1516   CALCIUM 8.7 02/28/2016 1325   ALKPHOS 74 05/30/2016 1516   ALKPHOS 60 02/28/2016 1325   AST 24 05/30/2016 1516   AST  22 02/28/2016 1325   ALT 21 05/30/2016 1516   ALT 18 02/28/2016 1325   BILITOT 0.3 05/30/2016 1516   BILITOT <0.30 02/28/2016 1325     Impression and Plan: Natalie Hendrix is 65 yo white female with polycythemia. She has not required a phlebotomy in quite a while. She is now feeling symptomatic with fatigue, migrane, worsening SOB, palpitations, itchy legs and joint pain.  She feels that she is in need of a phlebotomy today. Her Hct is 37.4 so we will go ahead and do a "mini" phlebotomy and remove 250 ml. She is staying well hydrated with water.  Prescriptions for Hyoscyamine and magic mouth wash sent to patients pharmacy.  She will see her new neurologist in [place] later this month.  We will plan to see her back in 2 months for labs and follow-up.  She will contact us with any questions or concerns. We can certainly see her sooner if need be.   Eliezer Bottom, NP 8/25/20171:59 PM

## 2016-07-26 NOTE — Progress Notes (Signed)
Natalie Hendrix presents today for phlebotomy per MD orders. Phlebotomy procedure started at 1510 and ended at 1525. 250 grams removed. Patient observed for 30 minutes after procedure without any incident. Patient tolerated procedure well. IV needle removed intact.

## 2016-07-29 ENCOUNTER — Ambulatory Visit: Payer: Medicare Other | Admitting: Hematology & Oncology

## 2016-07-29 ENCOUNTER — Other Ambulatory Visit: Payer: Medicare Other

## 2016-07-29 LAB — IRON AND TIBC
%SAT: 7 % — ABNORMAL LOW (ref 21–57)
Iron: 36 ug/dL — ABNORMAL LOW (ref 41–142)
TIBC: 483 ug/dL — ABNORMAL HIGH (ref 236–444)
UIBC: 447 ug/dL — AB (ref 120–384)

## 2016-07-29 LAB — FERRITIN: FERRITIN: 5 ng/mL — AB (ref 9–269)

## 2016-08-12 ENCOUNTER — Telehealth: Payer: Self-pay | Admitting: Internal Medicine

## 2016-08-12 NOTE — Telephone Encounter (Signed)
Wrong pt.Natalie Hendrix

## 2016-08-14 ENCOUNTER — Encounter: Payer: Self-pay | Admitting: Family Medicine

## 2016-08-14 ENCOUNTER — Encounter: Payer: Self-pay | Admitting: Internal Medicine

## 2016-08-14 DIAGNOSIS — F329 Major depressive disorder, single episode, unspecified: Secondary | ICD-10-CM

## 2016-08-14 DIAGNOSIS — R42 Dizziness and giddiness: Secondary | ICD-10-CM

## 2016-08-14 DIAGNOSIS — D751 Secondary polycythemia: Secondary | ICD-10-CM

## 2016-08-14 DIAGNOSIS — F32A Depression, unspecified: Secondary | ICD-10-CM

## 2016-08-14 NOTE — Telephone Encounter (Signed)
Web Message for Dr. Annamaria Boots:  hello Dr. Annamaria Boots, Aug 14, 2016  I request a 90day prescription for [place] be FAXED at [phone] to [facility], [place], [place].  Information must be included: Natalie Hendrix, [address], [place], [place] [address]                                  DOB June 14, 1951, SS# [id number]                                  to speak with someone, [phone] or [phone]                                  FAX # for 90 day RX [phone]  Thank you very much, [facility] [employer] [facility],   Natalie Hendrix to refill?  Last refilled: 06/17/14

## 2016-08-15 MED ORDER — AZELASTINE HCL 0.1 % NA SOLN: 1.0000 | Freq: Two times a day (BID) | NASAL | 3 refills | Status: DC

## 2016-08-15 NOTE — Addendum Note (Signed)
Addended by: Desmond Dike C on: 08/15/2016 11:56 AM   Modules accepted: Orders

## 2016-08-15 NOTE — Telephone Encounter (Signed)
Ok to Rx Astelin nasal spray, # 3, 1-2 puffs each nostril twice daily as needed, ref x 3

## 2016-08-16 MED ORDER — BUPROPION HCL ER (XL) 300 MG PO TB24: 300.0000 mg | ORAL_TABLET | Freq: Every day | ORAL | 3 refills | Status: AC

## 2016-08-29 DIAGNOSIS — R413 Other amnesia: Secondary | ICD-10-CM | POA: Insufficient documentation

## 2016-09-10 ENCOUNTER — Telehealth: Payer: Self-pay | Admitting: Internal Medicine

## 2016-09-10 MED ORDER — AZITHROMYCIN 250 MG PO TABS: ORAL_TABLET | ORAL | 0 refills | Status: DC

## 2016-09-10 NOTE — Telephone Encounter (Signed)
Spoke with pt husband, pt having increased sinus congestion, PND, low grade temp (99) and cough. Pt getting up a milky colored mucus from PND. O2 around 98% on 2-3 liters. Pt having gagging spells and throwing up with increased mucus build up in her throat/chest. Pt also having migraines. Requesting something be called in for her symptoms. Denies sore throat.  Pt states that they have recently replaced duct work and all filters in the house to help and improve the atmosphere at home.   Allergies  Allergen Reactions  . Qvar [Beclomethasone]   . Spiriva [Tiotropium Bromide Monohydrate]     rash  . Chlorzoxazone   . Epinephrine     Raises heart rate  . Lamotrigine   . Levofloxacin   . Neurontin [Gabapentin]     arthralgia  . Protriptyline Hcl     Severe constipation  . Ropinirole Hcl     arthralgia  . Tizanidine     panic  . Verapamil   . Zonegran     Mood swings  . Advair Diskus [Fluticasone-Salmeterol] Rash  . Baclofen Rash  . Chlorzoxazone Rash  . Levofloxacin Rash     Medication List       Accurate as of 09/10/16 10:44 AM. Always use your most recent med list.          albuterol (2.5 MG/3ML) 0.083% nebulizer solution Commonly known as:  PROVENTIL Take 3 mLs (2.5 mg total) by nebulization every 6 (six) hours as needed for wheezing or shortness of breath.   albuterol 108 (90 Base) MCG/ACT inhaler Commonly known as:  PROAIR HFA Inhale 2 puffs into the lungs every 6 (six) hours as needed for wheezing or shortness of breath.   azelastine 0.1 % nasal spray Commonly known as:  ASTELIN Place 1 spray into both nostrils 2 (two) times daily.   beclomethasone 42 MCG/SPRAY nasal spray Commonly known as:  BECONASE-AQ Place 2 sprays into both nostrils 2 (two) times daily. Dose is for each nostril.   benzonatate 100 MG capsule Commonly known as:  TESSALON Take 100 mg by mouth every 6 (six) hours as needed for cough.   buPROPion 300 MG 24 hr tablet Commonly known as:   WELLBUTRIN XL Take 1 tablet (300 mg total) by mouth daily with breakfast.   CALCIUM 1200 PO Take 2,400 mg by mouth daily.   CENTRUM SILVER tablet Take 1 tablet by mouth daily.   clopidogrel 75 MG tablet Commonly known as:  PLAVIX Take 1 tablet (75 mg total) by mouth daily.   co-enzyme Q-10 30 MG capsule Take 30 mg by mouth daily.   cyclobenzaprine 5 MG tablet Commonly known as:  FLEXERIL Take 5 mg by mouth 2 (two) times daily as needed. Phillips   ESTRACE VAGINAL 0.1 MG/GM vaginal cream Generic drug:  estradiol Place 1 Applicatorful vaginally daily.   ezetimibe 10 MG tablet Commonly known as:  ZETIA TAKE 1 TABLET BY MOUTH DAILY.   fexofenadine 180 MG tablet Commonly known as:  ALLEGRA Take 1 tablet (180 mg total) by mouth daily as needed for allergies. As needed   Fish Oil 1200 MG Caps Take 2,400 mg by mouth 2 (two) times daily.   FLECTOR 1.3 % Ptch Generic drug:  diclofenac Place 1 patch onto the skin 2 (two) times daily.   hyoscyamine 0.125 MG Tbdp disintergrating tablet Commonly known as:  ANASPAZ Place 1 tablet (0.125 mg total) under the tongue 2 (two) times daily as needed.   lidocaine 5 % Commonly  known as:  [place] 1 patch onto the skin as needed (pain). Remove & Discard patch within 12 hours or as directed by MD   LORazepam 1 MG tablet Commonly known as:  ATIVAN Take 1 tablet (1 mg total) by mouth 2 (two) times daily.   magic mouthwash w/lidocaine Soln Take 5 mLs by mouth 4 (four) times daily.   magnesium gluconate 500 MG tablet Commonly known as:  MAGONATE Take 500 mg by mouth daily as needed (constipation).   meclizine 25 MG tablet Commonly known as:  ANTIVERT Take 1 tablet (25 mg total) by mouth 3 (three) times daily as needed for dizziness. 3 month supply   metoprolol succinate 50 MG 24 hr tablet Commonly known as:  TOPROL-XL Take 1 tablet (50 mg total) by mouth daily with breakfast.   morphine 15 MG tablet Commonly known as:   MSIR Take 15 mg by mouth every 4 (four) hours as needed for severe pain.   morphine 30 MG 12 hr tablet Commonly known as:  MS CONTIN Take 15 mg by mouth 2 (two) times daily.   Nebulizer Devi Use as directed   OXYGEN Inhale 2.5 L/hr into the lungs continuous.   pantoprazole 40 MG tablet Commonly known as:  PROTONIX TAKE 1 TABLET (40 MG TOTAL) BY MOUTH DAILY.   pravastatin 40 MG tablet Commonly known as:  PRAVACHOL Take 1 tablet (40 mg total) by mouth at bedtime.   promethazine 25 MG tablet Commonly known as:  PHENERGAN Take 25 mg by mouth every 8 (eight) hours as needed.   SYSTANE 0.4-0.3 % Soln Generic drug:  Polyethyl Glycol-Propyl Glycol Apply to eye.   TART CHERRY ADVANCED PO Take 5 mLs by mouth daily. Juice Concentrate- 1tsp daily   ZOVIRAX 5 % Generic drug:  acyclovir ointment Apply 1 application topically as needed (flair).

## 2016-09-10 NOTE — Telephone Encounter (Signed)
Offer Z pak    # 6, 2 today then one daily

## 2016-09-10 NOTE — Telephone Encounter (Signed)
Spoke with patient husband- aware of rec's per CY Rx has been called into [facility] per pt request.  Nothing further needed.

## 2016-09-11 ENCOUNTER — Telehealth: Payer: Self-pay | Admitting: Internal Medicine

## 2016-09-11 NOTE — Telephone Encounter (Signed)
Pt was given zpak yesterday per 10.10.17 phone note Pt's pharmacist and insert in medication indicated that Protonix should not be taken while on this medication.  Patient is asking about this and if she should hold her Protonix 40mg  while taking the zpak.    Dr Annamaria Boots please advise, thank you.

## 2016-09-11 NOTE — Telephone Encounter (Signed)
Called and spoke with Darryl and he is aware of CY recs to hold the protonix while on the zpak.

## 2016-09-11 NOTE — Telephone Encounter (Signed)
Hold Protonix while on Z pak

## 2016-09-18 ENCOUNTER — Encounter: Payer: Self-pay | Admitting: Family Medicine

## 2016-09-18 MED ORDER — PRAVASTATIN SODIUM 40 MG PO TABS: 40.0000 mg | ORAL_TABLET | Freq: Every day | ORAL | 0 refills | Status: DC

## 2016-09-26 ENCOUNTER — Ambulatory Visit: Payer: Medicare Other | Admitting: Family

## 2016-09-26 ENCOUNTER — Encounter: Payer: Self-pay | Admitting: Internal Medicine

## 2016-09-26 ENCOUNTER — Other Ambulatory Visit: Payer: Medicare Other

## 2016-09-26 ENCOUNTER — Encounter: Payer: Self-pay | Admitting: Family Medicine

## 2016-09-26 DIAGNOSIS — F331 Major depressive disorder, recurrent, moderate: Secondary | ICD-10-CM

## 2016-09-27 MED ORDER — LORAZEPAM 1 MG PO TABS: 1.0000 mg | ORAL_TABLET | Freq: Two times a day (BID) | ORAL | 3 refills | Status: DC

## 2016-09-27 MED ORDER — ALBUTEROL SULFATE (2.5 MG/3ML) 0.083% IN NEBU: 2.5000 mg | INHALATION_SOLUTION | Freq: Four times a day (QID) | RESPIRATORY_TRACT | 1 refills | Status: DC | PRN

## 2016-09-27 MED ORDER — METOPROLOL SUCCINATE ER 50 MG PO TB24: 50.0000 mg | ORAL_TABLET | Freq: Every day | ORAL | 1 refills | Status: DC

## 2016-09-27 MED ORDER — EZETIMIBE 10 MG PO TABS: ORAL_TABLET | ORAL | 1 refills | Status: DC

## 2016-09-27 NOTE — Telephone Encounter (Signed)
I recommend controlled substance through local pharmacy - may phone in to local [facility] if pt prefers. Thanks.

## 2016-09-27 NOTE — Telephone Encounter (Signed)
Patient notified and Rx called to [facility] as directed. Asked that this Rx be placed on file as she still had 1 RF left on current Rx.

## 2016-09-27 NOTE — Telephone Encounter (Signed)
OK to RF 90 day supply to mail order? Last filled 06/11/16 #60 3RF. Will need printed Rx to fax.

## 2016-10-02 NOTE — Telephone Encounter (Signed)
Pt called back and [facility] [place] will not let pt have lorazepam until 10/11/16. Pt said she has not taken any extra pills. I spoke with Olin Hauser at [facility] and pt cannot have lorazepam filled until 10/11/16. Pt has 1 pill left. Pt gave phone to her husband and the pill bottle pt has list the bottle filled 09/05/16. Mr Weary said her previous doctor allowed pt to take 3 Lorazepam daily. Mr Burrough request cb to see if instructions can be increased back to 3 tabs daily[facility] . Mr Fette said sometimes when pt gets up to fast and pt does not get her balance she will fall. Please advise.

## 2016-10-03 MED ORDER — LORAZEPAM 1 MG PO TABS
1.0000 mg | ORAL_TABLET | Freq: Two times a day (BID) | ORAL | 0 refills | Status: DC
Start: 2016-10-03 — End: 2016-11-20

## 2016-10-03 NOTE — Telephone Encounter (Signed)
May phone in 1 month of #70 to take BID with 3rd tablet if needed. Pt needs to f/u in office to discuss medication. Did not f/u as asked last visit. plz ask pharmacy to fill early x1.

## 2016-10-03 NOTE — Addendum Note (Signed)
Addended by: Ria Bush on: 10/03/2016 01:13 AM   Modules accepted: Orders

## 2016-10-03 NOTE — Telephone Encounter (Signed)
Rx called in as directed. Patient notified and follow up scheduled.  

## 2016-10-08 ENCOUNTER — Ambulatory Visit: Payer: Medicare Other | Admitting: Hematology & Oncology

## 2016-10-08 ENCOUNTER — Other Ambulatory Visit: Payer: Medicare Other

## 2016-10-17 ENCOUNTER — Encounter: Payer: Self-pay | Admitting: Family Medicine

## 2016-10-17 ENCOUNTER — Ambulatory Visit (INDEPENDENT_AMBULATORY_CARE_PROVIDER_SITE_OTHER): Payer: Medicare Other | Admitting: Family Medicine

## 2016-10-17 VITALS — BP 118/74 | HR 80 | Temp 98.3°F | Wt 193.2 lb

## 2016-10-17 DIAGNOSIS — Z1159 Encounter for screening for other viral diseases: Secondary | ICD-10-CM

## 2016-10-17 DIAGNOSIS — Z23 Encounter for immunization: Secondary | ICD-10-CM

## 2016-10-17 DIAGNOSIS — R7303 Prediabetes: Secondary | ICD-10-CM

## 2016-10-17 DIAGNOSIS — Z8673 Personal history of transient ischemic attack (TIA), and cerebral infarction without residual deficits: Secondary | ICD-10-CM

## 2016-10-17 DIAGNOSIS — F331 Major depressive disorder, recurrent, moderate: Secondary | ICD-10-CM

## 2016-10-17 DIAGNOSIS — J302 Other seasonal allergic rhinitis: Secondary | ICD-10-CM

## 2016-10-17 DIAGNOSIS — J3089 Other allergic rhinitis: Secondary | ICD-10-CM

## 2016-10-17 DIAGNOSIS — E785 Hyperlipidemia, unspecified: Secondary | ICD-10-CM | POA: Diagnosis not present

## 2016-10-17 NOTE — Patient Instructions (Addendum)
Flu shot today Labs today. Return in 4-6 months for medicare wellness visit.  Ears are looking ok. Continue nasal steroid spray which should help.

## 2016-10-17 NOTE — Progress Notes (Signed)
Pre visit review using our clinic review tool, if applicable. No additional management support is needed unless otherwise documented below in the visit note. 

## 2016-10-17 NOTE — Progress Notes (Signed)
BP 118/74   Pulse 80   Temp 98.3 F (36.8 C) (Oral)   Wt 193 lb 4 oz (87.7 kg)   BMI 30.27 kg/m    CC: 4 mo f/u visit Subjective:    Patient ID: Natalie Hendrix, female    DOB: 26-Jul-1951, 65 y.o.   MRN: [id number]  HPI: Natalie Hendrix is a 65 y.o. female presenting on 10/17/2016 for Follow-up   Bad cold 8 wks ago. Treated for bronchitis with zpack last month (Dr Annamaria Boots)  Would like L ear evaluated - possible fluid in ear. Mild dizziness.   Known asthma never smoker on 2.5-3L O2 dependent at night and PRN exertion (via concentrator on demand), CVA (residual word finding, short term memory), h/o thoracic outlet syndrome, GERD, depression, migraines, fibromyalgia and polycythemia vera followed by Dr Marin Olp. She is on disability for h/o stroke with residual deficits.   Son remotely killed in [place], daughter from lung cancer. Grandson died 12-20-2015 in MVA. No other family nearby. PTSD from this. Has service chihuahua.   To establish with [facility] counselor.  Saw Dr Melrose Nakayama, note reviewed. Discharged from [facility]y care.  Relevant past medical, surgical, family and social history reviewed and updated as indicated. Interim medical history since our last visit reviewed. Allergies and medications reviewed and updated. Current Outpatient Prescriptions on File Prior to Visit  Medication Sig  . albuterol (PROAIR HFA) 108 (90 Base) MCG/ACT inhaler Inhale 2 puffs into the lungs every 6 (six) hours as needed for wheezing or shortness of breath.  Marland Kitchen albuterol (PROVENTIL) (2.5 MG/3ML) 0.083% nebulizer solution Take 3 mLs (2.5 mg total) by nebulization every 6 (six) hours as needed for wheezing or shortness of breath.  Marland Kitchen azelastine (ASTELIN) 0.1 % nasal spray Place 1 spray into both nostrils 2 (two) times daily.  . beclomethasone (BECONASE-AQ) 42 MCG/SPRAY nasal spray Place 2 sprays into both nostrils 2 (two) times daily. Dose is for each nostril.  . benzonatate (TESSALON) 100 MG capsule  Take 100 mg by mouth every 6 (six) hours as needed for cough.  Marland Kitchen buPROPion (WELLBUTRIN XL) 300 MG 24 hr tablet Take 1 tablet (300 mg total) by mouth daily with breakfast.  . Calcium Carbonate-Vit D-Min (CALCIUM 1200 PO) Take 2,400 mg by mouth daily.   . clopidogrel (PLAVIX) 75 MG tablet Take 1 tablet (75 mg total) by mouth daily.  Marland Kitchen co-enzyme Q-10 30 MG capsule Take 30 mg by mouth daily.   . cyclobenzaprine (FLEXERIL) 5 MG tablet Take 5 mg by mouth 2 (two) times daily as needed. [name]  . diclofenac (FLECTOR) 1.3 % PTCH Place 1 patch onto the skin 2 (two) times daily.  Marland Kitchen ESTRACE VAGINAL 0.1 MG/GM vaginal cream Place 1 Applicatorful vaginally daily.   Marland Kitchen ezetimibe (ZETIA) 10 MG tablet TAKE 1 TABLET BY MOUTH DAILY.  . fexofenadine (ALLEGRA) 180 MG tablet Take 1 tablet (180 mg total) by mouth daily as needed for allergies. As needed  . hyoscyamine (ANASPAZ) 0.125 MG TBDP disintergrating tablet Place 1 tablet (0.125 mg total) under the tongue 2 (two) times daily as needed.  . lidocaine (LIDODERM) 5 % Place 1 patch onto the skin as needed (pain). Remove & Discard patch within 12 hours or as directed by MD  . LORazepam (ATIVAN) 1 MG tablet Take 1 tablet (1 mg total) by mouth 2 (two) times daily.  . magic mouthwash w/lidocaine SOLN Take 5 mLs by mouth 4 (four) times daily.  . magnesium gluconate (MAGONATE) 500 MG tablet  Take 500 mg by mouth daily as needed (constipation).   . meclizine (ANTIVERT) 25 MG tablet Take 1 tablet (25 mg total) by mouth 3 (three) times daily as needed for dizziness. 3 month supply  . metoprolol succinate (TOPROL-XL) 50 MG 24 hr tablet Take 1 tablet (50 mg total) by mouth daily with breakfast.  . Misc Natural Products (TART CHERRY ADVANCED PO) Take 5 mLs by mouth daily. Juice Concentrate- 1tsp daily  . morphine (MS CONTIN) 30 MG 12 hr tablet Take 15 mg by mouth 2 (two) times daily.   Marland Kitchen morphine (MSIR) 15 MG tablet Take 15 mg by mouth every 4 (four) hours as needed for severe  pain.  . Multiple Vitamins-Minerals (CENTRUM SILVER) tablet Take 1 tablet by mouth daily.  . Omega-3 Fatty Acids (FISH OIL) 1200 MG CAPS Take 2,400 mg by mouth 2 (two) times daily.  . OXYGEN Inhale 2.5 L/hr into the lungs continuous.  . pantoprazole (PROTONIX) 40 MG tablet TAKE 1 TABLET (40 MG TOTAL) BY MOUTH DAILY.  Vladimir Faster Glycol-Propyl Glycol (SYSTANE) 0.4-0.3 % SOLN Apply to eye.  . pravastatin (PRAVACHOL) 40 MG tablet Take 1 tablet (40 mg total) by mouth at bedtime.  . promethazine (PHENERGAN) 25 MG tablet Take 25 mg by mouth every 8 (eight) hours as needed.   Marland Kitchen Respiratory Therapy Supplies (NEBULIZER) DEVI Use as directed  . ZOVIRAX 5 % Apply 1 application topically as needed (flair).    No current facility-administered medications on file prior to visit.     Review of Systems Per HPI unless specifically indicated in ROS section     Objective:    BP 118/74   Pulse 80   Temp 98.3 F (36.8 C) (Oral)   Wt 193 lb 4 oz (87.7 kg)   BMI 30.27 kg/m   Wt Readings from Last 3 Encounters:  10/17/16 193 lb 4 oz (87.7 kg)  07/26/16 190 lb (86.2 kg)  06/11/16 192 lb (87.1 kg)    Physical Exam  Constitutional: She appears well-developed and well-nourished. No distress.  HENT:  Head: Normocephalic and atraumatic.  Right Ear: Hearing, tympanic membrane, external ear and ear canal normal.  Left Ear: Hearing, tympanic membrane, external ear and ear canal normal.  Nose: Nose normal.  Mouth/Throat: Uvula is midline, oropharynx is clear and moist and mucous membranes are normal. No oropharyngeal exudate.  Eyes: Conjunctivae are normal. Pupils are equal, round, and reactive to light.  Neck: Normal range of motion. Neck supple.  Cardiovascular: Normal rate, regular rhythm, normal heart sounds and intact distal pulses.   No murmur heard. Pulmonary/Chest: Effort normal and breath sounds normal. No respiratory distress. She has no wheezes. She has no rales.  Musculoskeletal: She exhibits  no edema.  Lymphadenopathy:    She has no cervical adenopathy.  Skin: Skin is warm and dry. No rash noted.  Psychiatric: She has a normal mood and affect.  Nursing note and vitals reviewed.  Results for orders placed or performed in visit on 10/17/16  Hepatitis C antibody  Result Value Ref Range   HCV Ab NEGATIVE NEGATIVE      Assessment & Plan:   Problem List Items Addressed This Visit    History of CVA (cerebrovascular accident)    Continue aspirin, plavix - will need to verify she is taking aspirin. Has been discharged from Dr Melrose Nakayama @ [facility], does not want to see other local neurologists. Declines further neuro referral at this time, may consider seeing [employer] neurologist.  HLD (hyperlipidemia) - Primary    Update FLP today. On pravastatin and zetia.      Relevant Orders   Lipid panel   TSH   MDD (major depressive disorder), recurrent episode, moderate (HCC)    Trying to get into counseling with husband through [place]. No longer sees psychiatrist.       Prediabetes    Update A1c.       Relevant Orders   Hemoglobin A1c   Seasonal and perennial allergic rhinitis    TMs clear - anticipate sinus congestion with possible ETD leading to fluid in ear sensation. Rec continue INS.        Other Visit Diagnoses    Need for hepatitis C screening test       Relevant Orders   Hepatitis C antibody (Completed)   Need for influenza vaccination       Relevant Orders   Flu Vaccine QUAD 36+ mos PF IM (Fluarix & Fluzone Quad PF) (Completed)       Follow up plan: Return in about 4 months (around 02/14/2017) for medicare wellness visit.  Ria Bush, MD

## 2016-10-18 DIAGNOSIS — R7303 Prediabetes: Secondary | ICD-10-CM | POA: Insufficient documentation

## 2016-10-18 LAB — LIPID PANEL
CHOL/HDL RATIO: 2
Cholesterol: 169 mg/dL (ref 0–200)
HDL: 69.3 mg/dL (ref >39.00)
LDL Cholesterol: 80 mg/dL (ref 0–99)
NonHDL: 100.13
TRIGLYCERIDES: 99 mg/dL (ref 0.0–149.0)
VLDL: 19.8 mg/dL (ref 0.0–40.0)

## 2016-10-18 LAB — HEPATITIS C ANTIBODY: HCV AB: NEGATIVE

## 2016-10-18 LAB — HEMOGLOBIN A1C: Hgb A1c MFr Bld: 5.9 % (ref 4.6–6.5)

## 2016-10-18 LAB — TSH: TSH: 1.29 u[IU]/mL (ref 0.35–4.50)

## 2016-10-18 NOTE — Assessment & Plan Note (Signed)
Update A1c ?

## 2016-10-18 NOTE — Assessment & Plan Note (Signed)
Trying to get into counseling with husband through [place]. No longer sees psychiatrist.

## 2016-10-18 NOTE — Assessment & Plan Note (Signed)
Update FLP today. On pravastatin and zetia.

## 2016-10-18 NOTE — Assessment & Plan Note (Addendum)
Continue aspirin, plavix - will need to verify she is taking aspirin. Has been discharged from Dr Melrose Nakayama @ [facility], does not want to see other local neurologists. Declines further neuro referral at this time, may consider seeing [place] neurologist.

## 2016-10-18 NOTE — Assessment & Plan Note (Signed)
TMs clear - anticipate sinus congestion with possible ETD leading to fluid in ear sensation. Rec continue INS.

## 2016-10-22 ENCOUNTER — Ambulatory Visit (HOSPITAL_BASED_OUTPATIENT_CLINIC_OR_DEPARTMENT_OTHER): Payer: Medicare Other

## 2016-10-22 ENCOUNTER — Ambulatory Visit (HOSPITAL_BASED_OUTPATIENT_CLINIC_OR_DEPARTMENT_OTHER): Payer: Medicare Other | Admitting: Family

## 2016-10-22 ENCOUNTER — Other Ambulatory Visit (HOSPITAL_BASED_OUTPATIENT_CLINIC_OR_DEPARTMENT_OTHER): Payer: Medicare Other

## 2016-10-22 VITALS — BP 109/35 | HR 80 | Temp 98.9°F | Resp 18 | Wt 196.0 lb

## 2016-10-22 DIAGNOSIS — D500 Iron deficiency anemia secondary to blood loss (chronic): Secondary | ICD-10-CM

## 2016-10-22 DIAGNOSIS — D45 Polycythemia vera: Secondary | ICD-10-CM

## 2016-10-22 DIAGNOSIS — D509 Iron deficiency anemia, unspecified: Secondary | ICD-10-CM

## 2016-10-22 DIAGNOSIS — E860 Dehydration: Secondary | ICD-10-CM

## 2016-10-22 DIAGNOSIS — R0602 Shortness of breath: Secondary | ICD-10-CM | POA: Diagnosis not present

## 2016-10-22 DIAGNOSIS — K589 Irritable bowel syndrome without diarrhea: Secondary | ICD-10-CM

## 2016-10-22 LAB — CBC WITH DIFFERENTIAL (CANCER CENTER ONLY)
BASO#: 0 10*3/uL (ref 0.0–0.2)
BASO%: 0.3 % (ref 0.0–2.0)
EOS ABS: 0.2 10*3/uL (ref 0.0–0.5)
EOS%: 2.9 % (ref 0.0–7.0)
HCT: 37.6 % (ref 34.8–46.6)
HGB: 11.8 g/dL (ref 11.6–15.9)
LYMPH#: 2 10*3/uL (ref 0.9–3.3)
LYMPH%: 33.6 % (ref 14.0–48.0)
MCH: 23.4 pg — AB (ref 26.0–34.0)
MCHC: 31.4 g/dL — ABNORMAL LOW (ref 32.0–36.0)
MCV: 75 fL — ABNORMAL LOW (ref 81–101)
MONO#: 0.7 10*3/uL (ref 0.1–0.9)
MONO%: 11.3 % (ref 0.0–13.0)
NEUT#: 3 10*3/uL (ref 1.5–6.5)
NEUT%: 51.9 % (ref 39.6–80.0)
PLATELETS: 230 10*3/uL (ref 145–400)
RBC: 5.04 10*6/uL (ref 3.70–5.32)
RDW: 18.7 % — AB (ref 11.1–15.7)
WBC: 5.8 10*3/uL (ref 3.9–10.0)

## 2016-10-22 LAB — COMPREHENSIVE METABOLIC PANEL
ALT: 20 U/L (ref 0–55)
AST: 23 U/L (ref 5–34)
Albumin: 3.7 g/dL (ref 3.5–5.0)
Alkaline Phosphatase: 76 U/L (ref 40–150)
Anion Gap: 7 mEq/L (ref 3–11)
BILIRUBIN TOTAL: 0.35 mg/dL (ref 0.20–1.20)
BUN: 20.1 mg/dL (ref 7.0–26.0)
CHLORIDE: 108 meq/L (ref 98–109)
CO2: 27 meq/L (ref 22–29)
Calcium: 9.5 mg/dL (ref 8.4–10.4)
Creatinine: 0.8 mg/dL (ref 0.6–1.1)
EGFR: 76 mL/min/{1.73_m2} — AB (ref >90)
GLUCOSE: 103 mg/dL (ref 70–140)
POTASSIUM: 4.6 meq/L (ref 3.5–5.1)
SODIUM: 143 meq/L (ref 136–145)
TOTAL PROTEIN: 6.7 g/dL (ref 6.4–8.3)

## 2016-10-22 LAB — FERRITIN: Ferritin: 6 ng/ml — ABNORMAL LOW (ref 9–269)

## 2016-10-22 LAB — IRON AND TIBC
%SAT: 11 % — AB (ref 21–57)
Iron: 52 ug/dL (ref 41–142)
TIBC: 464 ug/dL — AB (ref 236–444)
UIBC: 412 ug/dL — AB (ref 120–384)

## 2016-10-22 MED ORDER — SODIUM CHLORIDE 0.9 % IV SOLN
Freq: Once | INTRAVENOUS | Status: AC
  Administered 2016-10-22: 13:00:00 via INTRAVENOUS

## 2016-10-22 NOTE — Progress Notes (Signed)
Hematology and Oncology Follow Up Visit  Natalie Hendrix [id number] 02/20/1951 65 y.o. 10/22/2016   Principle Diagnosis:  Polycythemia vera- JAK2 negative  Current Therapy:   Phlebotomy to maintain hematocrit less than 38% Plavix 75 mg by mouth daily    Interim History:  Natalie Hendrix is here today for a follow-up. She is feeling fatigued. Her husband has not been feeling well and she has been caring for him.  Her SOB is unchanged. She wears 2L supplemental O2 at night and PRN.  Her manual BP was 112/70, HR 81.  No fever, chills, n/v, cough, rash, chest pain, abdominal pain or changes in bowel or bladder habits.  No lymphadenopathy found on exam. No episodes of bleeding while on Plavix.  She has chronic issues with balance since having her stroke in 1999 and has bouts of dizziness due to vertigo. She uses a walker to help her ambulate. No falls or syncopal episodes.  The neuropathy in her hands and feet is unchanged. She wears her copper compression stockings daily.  Her appetite comes and goes and she admits that she has not been staying hydrated like she should. She is trying to curb her portions, improve protein intake and hoping to lose weight. Her weight is stable.   Medications:    Medication List       Accurate as of 10/22/16 12:35 PM. Always use your most recent med list.          albuterol 108 (90 Base) MCG/ACT inhaler Commonly known as:  PROAIR HFA Inhale 2 puffs into the lungs every 6 (six) hours as needed for wheezing or shortness of breath.   albuterol (2.5 MG/3ML) 0.083% nebulizer solution Commonly known as:  PROVENTIL Take 3 mLs (2.5 mg total) by nebulization every 6 (six) hours as needed for wheezing or shortness of breath.   azelastine 0.1 % nasal spray Commonly known as:  ASTELIN Place 1 spray into both nostrils 2 (two) times daily.   beclomethasone 42 MCG/SPRAY nasal spray Commonly known as:  BECONASE-AQ Place 2 sprays into both nostrils 2 (two) times  daily. Dose is for each nostril.   benzonatate 100 MG capsule Commonly known as:  TESSALON Take 100 mg by mouth every 6 (six) hours as needed for cough.   buPROPion 300 MG 24 hr tablet Commonly known as:  WELLBUTRIN XL Take 1 tablet (300 mg total) by mouth daily with breakfast.   CALCIUM 1200 PO Take 2,400 mg by mouth daily.   CENTRUM SILVER tablet Take 1 tablet by mouth daily.   clopidogrel 75 MG tablet Commonly known as:  PLAVIX Take 1 tablet (75 mg total) by mouth daily.   co-enzyme Q-10 30 MG capsule Take 30 mg by mouth daily.   cyclobenzaprine 5 MG tablet Commonly known as:  FLEXERIL Take 5 mg by mouth 2 (two) times daily as needed. Phillips   ESTRACE VAGINAL 0.1 MG/GM vaginal cream Generic drug:  estradiol Place 1 Applicatorful vaginally daily.   ezetimibe 10 MG tablet Commonly known as:  ZETIA TAKE 1 TABLET BY MOUTH DAILY.   fexofenadine 180 MG tablet Commonly known as:  ALLEGRA Take 1 tablet (180 mg total) by mouth daily as needed for allergies. As needed   Fish Oil 1200 MG Caps Take 2,400 mg by mouth 2 (two) times daily.   FLECTOR 1.3 % Ptch Generic drug:  diclofenac Place 1 patch onto the skin 2 (two) times daily.   hyoscyamine 0.125 MG Tbdp disintergrating tablet Commonly known as:  ANASPAZ Place 1 tablet (0.125 mg total) under the tongue 2 (two) times daily as needed.   lidocaine 5 % Commonly known as:  LIDODERM Place 1 patch onto the skin as needed (pain). Remove & Discard patch within 12 hours or as directed by MD   LORazepam 1 MG tablet Commonly known as:  ATIVAN Take 1 tablet (1 mg total) by mouth 2 (two) times daily.   magic mouthwash w/lidocaine Soln Take 5 mLs by mouth 4 (four) times daily.   magnesium gluconate 500 MG tablet Commonly known as:  MAGONATE Take 500 mg by mouth daily as needed (constipation).   meclizine 25 MG tablet Commonly known as:  ANTIVERT Take 1 tablet (25 mg total) by mouth 3 (three) times daily as needed for  dizziness. 3 month supply   metoprolol succinate 50 MG 24 hr tablet Commonly known as:  TOPROL-XL Take 1 tablet (50 mg total) by mouth daily with breakfast.   morphine 15 MG tablet Commonly known as:  MSIR Take 15 mg by mouth every 4 (four) hours as needed for severe pain.   morphine 30 MG 12 hr tablet Commonly known as:  MS CONTIN Take 15 mg by mouth 2 (two) times daily.   Nebulizer Devi Use as directed   OXYGEN Inhale 2.5 L/hr into the lungs continuous.   pantoprazole 40 MG tablet Commonly known as:  PROTONIX TAKE 1 TABLET (40 MG TOTAL) BY MOUTH DAILY.   pravastatin 40 MG tablet Commonly known as:  PRAVACHOL Take 1 tablet (40 mg total) by mouth at bedtime.   promethazine 25 MG tablet Commonly known as:  PHENERGAN Take 25 mg by mouth every 8 (eight) hours as needed.   SYSTANE 0.4-0.3 % Soln Generic drug:  Polyethyl Glycol-Propyl Glycol Apply to eye.   TART CHERRY ADVANCED PO Take 5 mLs by mouth daily. Juice Concentrate- 1tsp daily   ZOVIRAX 5 % Generic drug:  acyclovir ointment Apply 1 application topically as needed (flair).       Allergies:  Allergies  Allergen Reactions  . Qvar [Beclomethasone]   . Spiriva [Tiotropium Bromide Monohydrate]     rash  . Chlorzoxazone   . Epinephrine     Raises heart rate  . Lamotrigine   . Levofloxacin   . Neurontin [Gabapentin]     arthralgia  . Protriptyline Hcl     Severe constipation  . Ropinirole Hcl     arthralgia  . Tizanidine     panic  . Verapamil   . Zonegran     Mood swings  . Advair Diskus [Fluticasone-Salmeterol] Rash  . Baclofen Rash  . Chlorzoxazone Rash  . Levofloxacin Rash  . Protriptyline Rash  . Ropinirole Rash    Past Medical History, Surgical history, Social history, and Family History were reviewed and updated.  Review of Systems: All other 10 point review of systems is negative.   Physical Exam:  weight is 196 lb (88.9 kg). Her oral temperature is 98.9 F (37.2 C). Her blood  pressure is 109/35 (abnormal) and her pulse is 80. Her respiration is 18 and oxygen saturation is 98%.   Wt Readings from Last 3 Encounters:  10/22/16 196 lb (88.9 kg)  10/17/16 193 lb 4 oz (87.7 kg)  07/26/16 190 lb (86.2 kg)    Ocular: Sclerae unicteric, pupils equal, round and reactive to light Ear-nose-throat: Oropharynx clear, dentition fair Lymphatic: No cervical supraclavicular or axillary adenopathy Lungs no rales or rhonchi, good excursion bilaterally Heart regular rate and rhythm, no  murmur appreciated Abd soft, nontender, positive bowel sounds, no palpable liver or spleen tip, no fluid wave MSK no focal spinal tenderness, no joint edema Neuro: non-focal, well-oriented, appropriate affect Breasts: Deferred  Lab Results  Component Value Date   WBC 5.8 10/22/2016   HGB 11.8 10/22/2016   HCT 37.6 10/22/2016   MCV 75 (L) 10/22/2016   PLT 230 10/22/2016   Lab Results  Component Value Date   FERRITIN 5 (L) 07/26/2016   IRON 36 (L) 07/26/2016   TIBC 483 (H) 07/26/2016   UIBC 447 (H) 07/26/2016   IRONPCTSAT 7 (L) 07/26/2016   Lab Results  Component Value Date   RETICCTPCT 1.1 04/27/2015   RBC 5.04 10/22/2016   RETICCTABS 55.1 04/27/2015   No results found for: KPAFRELGTCHN, LAMBDASER, KAPLAMBRATIO No results found for: IGGSERUM, IGA, IGMSERUM No results found for: Ronnald Ramp, A1GS, A2GS, Tillman Sers, SPEI   Chemistry      Component Value Date/Time   NA 141 07/26/2016 1323   K 4.5 07/26/2016 1323   CL 104 05/30/2016 1516   CL 102 10/17/2014 1413   CO2 26 07/26/2016 1323   BUN 11.5 07/26/2016 1323   CREATININE 0.8 07/26/2016 1323      Component Value Date/Time   CALCIUM 9.4 07/26/2016 1323   ALKPHOS 70 07/26/2016 1323   AST 25 07/26/2016 1323   ALT 19 07/26/2016 1323   BILITOT 0.39 07/26/2016 1323     Impression and Plan: Natalie Hendrix is 65 yo white female with polycythemia. She continues to remain stable with her counts and  will not require a phlebotomy today.  She is iron deficient due to past phlebotomies to keep her Hct < 32% and she has intermittent fatigue as a result.  She is currently taking Plavix as well as 1 baby aspirin daily.  She did admit that she felt a bit dehydrated today so we gave her fluids.  We will plan to see her back in 2 months for labs and follow-up.  She will contact us with any questions or concerns. We can certainly see her sooner if need be.   Eliezer Bottom, NP 11/21/201712:35 PM

## 2016-10-22 NOTE — Patient Instructions (Signed)
Dehydration, Adult Dehydration is when there is not enough fluid or water in your body. This happens when you lose more fluids than you take in. Dehydration can range from mild to very bad. It should be treated right away to keep it from getting very bad. Symptoms of mild dehydration may include:   Thirst.  Dry lips.  Slightly dry mouth.  Dry, warm skin.  Dizziness. Symptoms of moderate dehydration may include:   Very dry mouth.  Muscle cramps.  Dark pee (urine). Pee may be the color of tea.  Your body making less pee.  Your eyes making fewer tears.  Heartbeat that is uneven or faster than normal (palpitations).  Headache.  Light-headedness, especially when you stand up from sitting.  Fainting (syncope). Symptoms of very bad dehydration may include:   Changes in skin, such as:  Cold and clammy skin.  Blotchy (mottled) or pale skin.  Skin that does not quickly return to normal after being lightly pinched and let go (poor skin turgor).  Changes in body fluids, such as:  Feeling very thirsty.  Your eyes making fewer tears.  Not sweating when body temperature is high, such as in hot weather.  Your body making very little pee.  Changes in vital signs, such as:  Weak pulse.  Pulse that is more than 100 beats a minute when you are sitting still.  Fast breathing.  Low blood pressure.  Other changes, such as:  Sunken eyes.  Cold hands and feet.  Confusion.  Lack of energy (lethargy).  Trouble waking up from sleep.  Short-term weight loss.  Unconsciousness. Follow these instructions at home:  If told by your doctor, drink an ORS:  Make an ORS by using instructions on the package.  Start by drinking small amounts, about  cup (120 mL) every 5-10 minutes.  Slowly drink more until you have had the amount that your doctor said to have.  Drink enough clear fluid to keep your pee clear or pale yellow. If you were told to drink an ORS, finish the  ORS first, then start slowly drinking clear fluids. Drink fluids such as:  Water. Do not drink only water by itself. Doing that can make the salt (sodium) level in your body get too low (hyponatremia).  Ice chips.  Fruit juice that you have added water to (diluted).  Low-calorie sports drinks.  Avoid:  Alcohol.  Drinks that have a lot of sugar. These include high-calorie sports drinks, fruit juice that does not have water added, and soda.  Caffeine.  Foods that are greasy or have a lot of fat or sugar.  Take over-the-counter and prescription medicines only as told by your doctor.  Do not take salt tablets. Doing that can make the salt level in your body get too high (hypernatremia).  Eat foods that have minerals (electrolytes). Examples include bananas, oranges, potatoes, tomatoes, and spinach.  Keep all follow-up visits as told by your doctor. This is important. Contact a doctor if:  You have belly (abdominal) pain that:  Gets worse.  Stays in one area (localizes).  You have a rash.  You have a stiff neck.  You get angry or annoyed more easily than normal (irritability).  You are more sleepy than normal.  You have a harder time waking up than normal.  You feel:  Weak.  Dizzy.  Very thirsty.  You have peed (urinated) only a small amount of very dark pee during 6-8 hours. Get help right away if:  You   have symptoms of very bad dehydration.  You cannot drink fluids without throwing up (vomiting).  Your symptoms get worse with treatment.  You have a fever.  You have a very bad headache.  You are throwing up or having watery poop (diarrhea) and it:  Gets worse.  Does not go away.  You have blood or something green (bile) in your throw-up.  You have blood in your poop (stool). This may cause poop to look black and tarry.  You have not peed in 6-8 hours.  You pass out (faint).  Your heart rate when you are sitting still is more than 100 beats a  minute.  You have trouble breathing. This information is not intended to replace advice given to you by your health care provider. Make sure you discuss any questions you have with your health care provider. Document Released: 09/14/2009 Document Revised: 06/07/2016 Document Reviewed: 01/12/2016 Elsevier Interactive Patient Education  2017 Elsevier Inc.  

## 2016-10-23 ENCOUNTER — Encounter: Payer: Self-pay | Admitting: Family

## 2016-11-06 ENCOUNTER — Encounter: Payer: Self-pay | Admitting: Family Medicine

## 2016-11-07 ENCOUNTER — Other Ambulatory Visit: Payer: Self-pay

## 2016-11-07 ENCOUNTER — Other Ambulatory Visit: Payer: Self-pay | Admitting: *Deleted

## 2016-11-07 DIAGNOSIS — F329 Major depressive disorder, single episode, unspecified: Secondary | ICD-10-CM

## 2016-11-07 DIAGNOSIS — F32A Depression, unspecified: Secondary | ICD-10-CM

## 2016-11-07 DIAGNOSIS — D751 Secondary polycythemia: Secondary | ICD-10-CM

## 2016-11-07 DIAGNOSIS — R42 Dizziness and giddiness: Secondary | ICD-10-CM

## 2016-11-07 MED ORDER — METOPROLOL SUCCINATE ER 50 MG PO TB24: 50.0000 mg | ORAL_TABLET | Freq: Every day | ORAL | 1 refills | Status: AC

## 2016-11-07 MED ORDER — MECLIZINE HCL 25 MG PO TABS: 25.0000 mg | ORAL_TABLET | Freq: Three times a day (TID) | ORAL | 3 refills | Status: DC | PRN

## 2016-11-07 MED ORDER — METOPROLOL SUCCINATE ER 50 MG PO TB24: 50.0000 mg | ORAL_TABLET | Freq: Every day | ORAL | 1 refills | Status: DC

## 2016-11-07 NOTE — Telephone Encounter (Signed)
Pt request refill metoprolol to [facility]; pt got call from [facility] rx was ready and pt wanted rx to go to [facility]. Advised pt sent to [facility] and I called [facility] and cancelled that rx. Pt voiced understanding.

## 2016-11-20 ENCOUNTER — Encounter: Payer: Self-pay | Admitting: Family Medicine

## 2016-11-20 ENCOUNTER — Encounter: Payer: Self-pay | Admitting: Internal Medicine

## 2016-11-20 ENCOUNTER — Other Ambulatory Visit: Payer: Self-pay | Admitting: Family Medicine

## 2016-11-20 ENCOUNTER — Encounter: Payer: Self-pay | Admitting: Hematology & Oncology

## 2016-11-20 NOTE — Telephone Encounter (Signed)
Plz phone in

## 2016-11-20 NOTE — Telephone Encounter (Signed)
[name]  to [name], MD       11/20/16 4:02 PM  Hello Dr. Annamaria Boots and Staff, It is time for 2 new 90day RX s sent to [facility] for Veatrice Kells. one 90 day for Astelin, and one 90 day for the Tussalon Pearls ( OOps on the generic word).  Please include the following: Name: Natalie Hendrix, [address], [place], [place], [address], DOB 07-Feb-2051,  Last SS# [id number], Sponsor [phone]. Fax # [phone] or by your electronic device.   Thank you for you kind assistance.   Have a Merry Christmas and Happy and Blessed New Year,  [name]

## 2016-11-21 MED ORDER — AZELASTINE HCL 0.1 % NA SOLN: 1.0000 | Freq: Two times a day (BID) | NASAL | 3 refills | Status: DC

## 2016-11-21 MED ORDER — LORAZEPAM 1 MG PO TABS: 1.0000 mg | ORAL_TABLET | Freq: Two times a day (BID) | ORAL | 3 refills | Status: DC

## 2016-11-21 MED ORDER — BENZONATATE 100 MG PO CAPS: 100.0000 mg | ORAL_CAPSULE | Freq: Four times a day (QID) | ORAL | 3 refills | Status: DC | PRN

## 2016-11-21 NOTE — Telephone Encounter (Signed)
Patient wants med sent to mail order. Will need written Rx to fax.

## 2016-11-21 NOTE — Telephone Encounter (Signed)
I don't do 73mo supply for controlled substances. Will provide with 3 RF however Printed and in Kim's box.

## 2016-11-21 NOTE — Addendum Note (Signed)
Addended by: Desmond Dike C on: 11/21/2016 04:19 PM   Modules accepted: Orders

## 2016-11-21 NOTE — Telephone Encounter (Signed)
CY, Pt last had Tessalon rx in 2014. Please advise if okay to send signed Rx's for Tessalon and Astelin to [facility] for patient. Thanks.

## 2016-11-21 NOTE — Telephone Encounter (Signed)
Patient wants this sent to mail order. Will need written Rx to fax. Thanks

## 2016-11-21 NOTE — Telephone Encounter (Signed)
Ok to refill each x 1 year

## 2016-11-22 ENCOUNTER — Other Ambulatory Visit: Payer: Self-pay

## 2016-11-22 DIAGNOSIS — R42 Dizziness and giddiness: Secondary | ICD-10-CM

## 2016-11-22 DIAGNOSIS — F329 Major depressive disorder, single episode, unspecified: Secondary | ICD-10-CM

## 2016-11-22 DIAGNOSIS — F32A Depression, unspecified: Secondary | ICD-10-CM

## 2016-11-22 DIAGNOSIS — D751 Secondary polycythemia: Secondary | ICD-10-CM

## 2016-11-22 MED ORDER — MECLIZINE HCL 25 MG PO TABS: 25.0000 mg | ORAL_TABLET | Freq: Three times a day (TID) | ORAL | 0 refills | Status: AC | PRN

## 2016-11-22 NOTE — Telephone Encounter (Signed)
FAXED

## 2016-12-03 ENCOUNTER — Encounter: Payer: Self-pay | Admitting: Family Medicine

## 2016-12-09 MED ORDER — EZETIMIBE 10 MG PO TABS: ORAL_TABLET | ORAL | 3 refills | Status: AC

## 2016-12-09 MED ORDER — PRAVASTATIN SODIUM 40 MG PO TABS: 40.0000 mg | ORAL_TABLET | Freq: Every day | ORAL | 3 refills | Status: AC

## 2016-12-12 ENCOUNTER — Encounter: Payer: Self-pay | Admitting: Internal Medicine

## 2016-12-12 ENCOUNTER — Ambulatory Visit (INDEPENDENT_AMBULATORY_CARE_PROVIDER_SITE_OTHER): Payer: Medicare Other | Admitting: Internal Medicine

## 2016-12-12 DIAGNOSIS — J9611 Chronic respiratory failure with hypoxia: Secondary | ICD-10-CM | POA: Diagnosis not present

## 2016-12-12 DIAGNOSIS — J4520 Mild intermittent asthma, uncomplicated: Secondary | ICD-10-CM | POA: Diagnosis not present

## 2016-12-12 DIAGNOSIS — F331 Major depressive disorder, recurrent, moderate: Secondary | ICD-10-CM

## 2016-12-12 NOTE — Progress Notes (Signed)
HPI female never smoker followed for asthma, history pneumonia, cough, complicated by past history for thoracic outlet syndrome, chronic hypoxic respiratory failure, complicated by CVA, GERD, allergic rhinitis, polycythemia, depression O2 2 L sleep and prn/[facility]  Nl PFT 2012 except DLCO 62% Allergy profile was NEG, 4.9 total IgE GI/ Dr Paulita Fujita: Ba swallow> stricture and probably mild aspiration Residual right diaphragm elevation after surgery for right thoracic outlet syndrome -------------------------------------------------------------------------------- 04/20/13- 62 yoF never smoker, asthma and history of pneumonia with cough, complicated by past history surgery for thoracic outlet syndrome, CVA, allergic rhinitis, polycythemia. Husband here    PCP Dr Jacelyn Grip    Bronchitis with productive cough after an afternoon outdoors. She stayed indoors the next day and is better now. Continues oxygen 2-3 L/[facility]. Medications reviewed.these  10/19/13- 62 yoF never smoker, asthma and history of pneumonia with cough, complicated by past history surgery for thoracic outlet syndrome, CVA, GERD, allergic rhinitis, polycythemia. Husband here    PCP Dr Berline Chough for- c/o wheezing, SOB with exertion, hoarseness, non prod cough. Little phlegm.   Dtr in [place] dying of leukemia. Pt is on O2 2-3L/ [facility].  [facility]/ [facility] referred her to   01/21/14-  62 yoF never smoker, asthma and history of pneumonia with cough, complicated by past history surgery for thoracic outlet syndrome, CVA, GERD, allergic rhinitis, polycythemia, depression. Husband here    PCP Dr Jacelyn Grip    ACUTE VISIT: increased cough; getting worse. Losing her voice. Woke up with laryngitis/bronchitis, coughing green. Tussive soreness upper trachea area. No definite fever. Had dysphagia on swallowing evaluation. GI/ Dr Paulita Fujita. Continues oxygen 2.5L/ [facility] Swallowing eval- Dysphagia III  04/19/14-63 yoF never smoker, asthma and history of  pneumonia with cough, complicated by past history surgery for thoracic outlet syndrome, CVA, GERD, allergic rhinitis, polycythemia, depression. Husband here    PCP Dr Jacelyn Grip  FOLLOWS FOR: Pt states she has done well with the change in the weather. The new O2 tank she has is working better for her. Using home ionizer air cleaner Seeing GI for GERD  01/18/15-63 yoF never smoker, asthma and history of pneumonia with cough, complicated by past history surgery for thoracic outlet syndrome, CVA, GERD, allergic rhinitis, polycythemia, depression. Husband here     FOLLOWS FOR: Pt states she is doing well overall and continues wearing her O2 2.5L/ [facility]-would like to change to simple go mini tank-will need order for [facility]. CT chest 10/24/14 FINDINGS: CT CHEST FINDINGS- reviewed There is heterogeneity and sub cm nodularity in the right lobe of the thyroid. No pathologically enlarged mediastinal, hilar or axillary lymph nodes. Surgical clips are seen in the right supraclavicular fossa. Atherosclerotic calcification of the arterial vasculature. Heart size normal. No pericardial effusion. Chronic appearing volume loss is seen in the right lower lobe, adjacent to a chronically elevated right hemidiaphragm. Dependent atelectasis bilaterally. Lungs are otherwise clear. No pleural fluid. Airway is unremarkable.  07/13/15- 64 yoF never smoker, asthma and history of pneumonia with cough, complicated by past history surgery for thoracic outlet syndrome, CVA, GERD, allergic rhinitis, polycythemia, depression. Husband here   FOLLOWS FOR: Pt states she is having left sinus issues-? inflammation or infection-has left sided pain on face and around tooth-gets better once drains. O2 2.5 L/ [facility] Dentist did x-ray and told her she had sinus infection. She continues using oxygen and feels she needs it. Describes very easy dyspnea on exertion on room air relieved by oxygen  11/13/2015-66 year old female never smoker  followed for asthma, history pneumonia, cough, complicated by  past history surgery for thoracic outlet syndrome, CVA, GERD, allergic rhinitis, polycythemia, depression O2 2 L sleep, and prn/[facility]   husband here She refuses flu shot Using her oxygen more as the time and recognizes that it helps especially with mild exertion and activity outdoors. Feels well at today's visit with no acute issue.  06/10/2016-66 year old female never smoker followed for asthma, history pneumonia, cough, complicated by past history for thoracic outlet syndrome, CVA, GERD, allergic rhinitis, polycythemia, depression O2 2 L sleep and prn/[facility]  FOLLOWS FOR: Pt states she recently fell-during that time she increased her O2 to 3lpm QHS. Otherwise doing well. No routine cough. Occasional mild wheeze. Using either rescue inhaler or nebulizer about once a day now, blaming hot weather. Continues oxygen for sleep and if needed during the daytime. Today's anniversary of son's suicide-survivor guilt soldier in [place]. She is trying to cope with that, help her husband with his chronic illness and other problems.  12/12/2016-66 year old female never smoker followed for asthma, history pneumonia, cough, chronic hypoxic respiratory failure,  past history for thoracic outlet syndrome, chronic elevation right diaphragm,complicated by CVA, GERD, allergic rhinitis, polycythemia, depression O2 2 L sleep and prn/[facility]  FOLLOWS FOR: Pt states she is "holding on"; recently sick and had to get abx. Pt increases O2 liter flow as needed. No significant health changes of her own. She is worn out, very anxious and very depressed trying to help her husband who is dealing with multiple progressive medical problems.  Review of Systems- See HPI Constitutional:   No-   weight loss, night sweats, fevers, chills, fatigue, lassitude. HEENT:   frequent  headaches,  Some difficulty swallowing,, sore throat,       No-  sneezing, itching, ear ache,  nasal congestion, post nasal drip,  CV:  No-   chest pain, orthopnea, PND, swelling in lower extremities, anasarca, dizziness, palpitations Resp: + shortness of breath with exertion or at rest. productive cough              No-  coughing up of blood.              No-  change in color of mucus.   wheezing.   Skin: No-   rash or lesions. GI:  No-   heartburn, indigestion, abdominal pain, nausea, vomiting, GU:  MS:  No-   joint pain or swelling.   Neuro- nothing new or unusual  Psych:  No- change in mood or affect. No depression or anxiety.  No memory loss.    Objective:   Physical Exam  General- Alert, Oriented, Affect+ tearful, Distress- none acute,  Overweight.                Brought portable O2 but on room air,  Skin- rash-none, lesions- none, excoriation- none Lymphadenopathy- none Head- atraumatic            Eyes- Gross vision intact, PERRLA, conjunctivae clear secretions            Ears- Hearing, canals            Nose- Clear, No- Septal dev, mucus, polyps, erosion, perforation             Throat- Mallampati III , mucosa clear , drainage- none, tonsils- atrophic, +hoarse Neck- flexible , trachea midline, no stridor , thyroid nl, carotid no bruit Chest - symmetrical excursion , unlabored           Heart/CV- RRR , no murmur , no gallop  , no rub, nl  s1 s2                           - JVD- none , edema- none, stasis changes- none, varices- none           Lung- clear to P&A, wheeze-none, cough-none,                                                    dullness-none, rub- none, unlabored           Chest wall- +Vascular surgery scar Right Upper Anterior chest Abd- Br/ Gen/ Rectal- Not done, not indicated Extrem- cyanosis- none, clubbing, none, atrophy- none, strength- . +Rolling walker Neuro- + some word searching

## 2016-12-12 NOTE — Assessment & Plan Note (Addendum)
She is struggling to control her emotions related to husband's ill health and her inability to care for him as she wants. I recommended she discuss this with her primary physician soon. We're going to try to get a home health nurse assessment to see what cancer support might be available

## 2016-12-12 NOTE — Assessment & Plan Note (Signed)
She uses oxygen most the time around the house. Comfortable on room air at rest during our discussion. She took oxygen off so she could blow her nose while crying

## 2016-12-12 NOTE — Patient Instructions (Addendum)
We can continue current meds and home O2  Please call if we can help

## 2016-12-12 NOTE — Assessment & Plan Note (Signed)
Controlled near baseline currently. Hopefully we can get through the winter without major exacerbation.

## 2016-12-25 ENCOUNTER — Encounter: Payer: Self-pay | Admitting: Hematology & Oncology

## 2016-12-25 ENCOUNTER — Ambulatory Visit (HOSPITAL_BASED_OUTPATIENT_CLINIC_OR_DEPARTMENT_OTHER): Payer: Medicare Other

## 2016-12-25 ENCOUNTER — Other Ambulatory Visit (HOSPITAL_BASED_OUTPATIENT_CLINIC_OR_DEPARTMENT_OTHER): Payer: Medicare Other

## 2016-12-25 ENCOUNTER — Ambulatory Visit (HOSPITAL_BASED_OUTPATIENT_CLINIC_OR_DEPARTMENT_OTHER): Payer: Medicare Other | Admitting: Hematology & Oncology

## 2016-12-25 VITALS — BP 136/70 | HR 71 | Temp 98.6°F | Ht 67.0 in | Wt 192.0 lb

## 2016-12-25 DIAGNOSIS — B308 Other viral conjunctivitis: Secondary | ICD-10-CM

## 2016-12-25 DIAGNOSIS — D500 Iron deficiency anemia secondary to blood loss (chronic): Secondary | ICD-10-CM

## 2016-12-25 DIAGNOSIS — D45 Polycythemia vera: Secondary | ICD-10-CM | POA: Diagnosis not present

## 2016-12-25 LAB — COMPREHENSIVE METABOLIC PANEL
ALT: 19 U/L (ref 0–55)
ANION GAP: 9 meq/L (ref 3–11)
AST: 24 U/L (ref 5–34)
Albumin: 3.8 g/dL (ref 3.5–5.0)
Alkaline Phosphatase: 72 U/L (ref 40–150)
BILIRUBIN TOTAL: 0.49 mg/dL (ref 0.20–1.20)
BUN: 15 mg/dL (ref 7.0–26.0)
CALCIUM: 9.5 mg/dL (ref 8.4–10.4)
CHLORIDE: 107 meq/L (ref 98–109)
CO2: 26 mEq/L (ref 22–29)
CREATININE: 0.8 mg/dL (ref 0.6–1.1)
EGFR: 78 mL/min/{1.73_m2} — AB (ref >90)
Glucose: 79 mg/dl (ref 70–140)
Potassium: 4.8 mEq/L (ref 3.5–5.1)
Sodium: 142 mEq/L (ref 136–145)
TOTAL PROTEIN: 6.7 g/dL (ref 6.4–8.3)

## 2016-12-25 LAB — CBC WITH DIFFERENTIAL (CANCER CENTER ONLY)
BASO#: 0 10*3/uL (ref 0.0–0.2)
BASO%: 0.4 % (ref 0.0–2.0)
EOS ABS: 0.2 10*3/uL (ref 0.0–0.5)
EOS%: 2.7 % (ref 0.0–7.0)
HEMATOCRIT: 41 % (ref 34.8–46.6)
HGB: 13.1 g/dL (ref 11.6–15.9)
LYMPH#: 1.2 10*3/uL (ref 0.9–3.3)
LYMPH%: 21.6 % (ref 14.0–48.0)
MCH: 24.5 pg — ABNORMAL LOW (ref 26.0–34.0)
MCHC: 32 g/dL (ref 32.0–36.0)
MCV: 77 fL — AB (ref 81–101)
MONO#: 0.5 10*3/uL (ref 0.1–0.9)
MONO%: 9 % (ref 0.0–13.0)
NEUT#: 3.8 10*3/uL (ref 1.5–6.5)
NEUT%: 66.3 % (ref 39.6–80.0)
Platelets: 253 10*3/uL (ref 145–400)
RBC: 5.35 10*6/uL — ABNORMAL HIGH (ref 3.70–5.32)
RDW: 18.1 % — AB (ref 11.1–15.7)
WBC: 5.7 10*3/uL (ref 3.9–10.0)

## 2016-12-25 MED ORDER — NEOMYCIN-POLYMYXIN-HC 3.5-10000-1 OP SUSP: 2.0000 [drp] | Freq: Three times a day (TID) | OPHTHALMIC | 0 refills | Status: DC

## 2016-12-25 NOTE — Progress Notes (Signed)
Hematology and Oncology Follow Up Visit  Natalie Hendrix [id number] Feb 05, 1951 66 y.o. 12/25/2016   Principle Diagnosis:   Polycythemia vera- JAK2 negative  Current Therapy:    Phlebotomy to maintain hematocrit less than 38%  Plavix 75 mg by mouth daily     Interim History:  [name] is back for followup. She is started having some more headaches. This too happens when her hematocrit goes up. I'm sure that we will have to phlebotomize her today.   She also is complaining of pruritus with her eyes. There is no discharge. She's had no type of erythema or conjunctival inflammation. This has been going on for about a week or so.   She has not fallen. She did make it through the holidays in a pretty good shape. Her husband, however, is having a lot of problems. Xylocaine Matey to have some exploratory surgery for recurrent bowel obstruction.   When we last saw her back in November, her ferritin was 6 with an iron saturation of 11%. She clearly is iron deficient.  Overall, her performance status is ECOG 2.   Medications:  Current Outpatient Prescriptions:  .  albuterol (PROAIR HFA) 108 (90 Base) MCG/ACT inhaler, Inhale 2 puffs into the lungs every 6 (six) hours as needed for wheezing or shortness of breath., Disp: 3 Inhaler, Rfl: 3 .  albuterol (PROVENTIL) (2.5 MG/3ML) 0.083% nebulizer solution, Take 3 mLs (2.5 mg total) by nebulization every 6 (six) hours as needed for wheezing or shortness of breath., Disp: 1080 mL, Rfl: 1 .  azelastine (ASTELIN) 0.1 % nasal spray, Place 1 spray into both nostrils 2 (two) times daily., Disp: 90 mL, Rfl: 3 .  beclomethasone (BECONASE-AQ) 42 MCG/SPRAY nasal spray, Place 2 sprays into both nostrils 2 (two) times daily. Dose is for each nostril., Disp: 75 g, Rfl: 3 .  benzonatate (TESSALON) 100 MG capsule, Take 1 capsule (100 mg total) by mouth every 6 (six) hours as needed for cough., Disp: 360 capsule, Rfl: 3 .  buPROPion (WELLBUTRIN XL) 300 MG 24 hr  tablet, Take 1 tablet (300 mg total) by mouth daily with breakfast., Disp: 90 tablet, Rfl: 3 .  Calcium Carbonate-Vit D-Min (CALCIUM 1200 PO), Take 2,400 mg by mouth daily. , Disp: , Rfl:  .  clopidogrel (PLAVIX) 75 MG tablet, Take 1 tablet (75 mg total) by mouth daily., Disp: 90 tablet, Rfl: 3 .  co-enzyme Q-10 30 MG capsule, Take 30 mg by mouth daily. , Disp: , Rfl:  .  cyclobenzaprine (FLEXERIL) 5 MG tablet, Take 5 mg by mouth 2 (two) times daily as needed. Phillips, Disp: , Rfl: 0 .  diclofenac (FLECTOR) 1.3 % PTCH, Place 1 patch onto the skin 2 (two) times daily., Disp: , Rfl:  .  ESTRACE VAGINAL 0.1 MG/GM vaginal cream, Place 1 Applicatorful vaginally daily. , Disp: , Rfl:  .  ezetimibe (ZETIA) 10 MG tablet, TAKE 1 TABLET BY MOUTH DAILY., Disp: 90 tablet, Rfl: 3 .  fexofenadine (ALLEGRA) 180 MG tablet, Take 1 tablet (180 mg total) by mouth daily as needed for allergies. As needed, Disp: 90 tablet, Rfl: 3 .  hyoscyamine (ANASPAZ) 0.125 MG TBDP disintergrating tablet, Place 1 tablet (0.125 mg total) under the tongue 2 (two) times daily as needed., Disp: 30 tablet, Rfl: 0 .  lidocaine (LIDODERM) 5 %, Place 1 patch onto the skin as needed (pain). Remove & Discard patch within 12 hours or as directed by MD, Disp: , Rfl:  .  LORazepam (ATIVAN)  1 MG tablet, Take 1 tablet (1 mg total) by mouth 2 (two) times daily. May take extra 3rd dose if needed, Disp: 75 tablet, Rfl: 3 .  magic mouthwash w/lidocaine SOLN, Take 5 mLs by mouth 4 (four) times daily., Disp: 240 mL, Rfl: 3 .  magnesium gluconate (MAGONATE) 500 MG tablet, Take 500 mg by mouth daily as needed (constipation). , Disp: , Rfl:  .  meclizine (ANTIVERT) 25 MG tablet, Take 1 tablet (25 mg total) by mouth 3 (three) times daily as needed for dizziness. 3 month supply, Disp: 60 tablet, Rfl: 0 .  metoprolol succinate (TOPROL-XL) 50 MG 24 hr tablet, Take 1 tablet (50 mg total) by mouth daily with breakfast., Disp: 90 tablet, Rfl: 1 .  Misc Natural  Products (TART CHERRY ADVANCED PO), Take 5 mLs by mouth daily. Juice Concentrate- 1tsp daily, Disp: , Rfl:  .  morphine (MS CONTIN) 30 MG 12 hr tablet, Take 15 mg by mouth 2 (two) times daily. , Disp: , Rfl:  .  morphine (MSIR) 15 MG tablet, Take 15 mg by mouth every 4 (four) hours as needed for severe pain., Disp: , Rfl:  .  Multiple Vitamins-Minerals (CENTRUM SILVER) tablet, Take 1 tablet by mouth daily., Disp: , Rfl:  .  Omega-3 Fatty Acids (FISH OIL) 1200 MG CAPS, Take 2,400 mg by mouth 2 (two) times daily., Disp: , Rfl:  .  OXYGEN, Inhale 2.5 L/hr into the lungs continuous., Disp: , Rfl:  .  pantoprazole (PROTONIX) 40 MG tablet, TAKE 1 TABLET (40 MG TOTAL) BY MOUTH DAILY., Disp: 90 tablet, Rfl: 3 .  Polyethyl Glycol-Propyl Glycol (SYSTANE) 0.4-0.3 % SOLN, Apply to eye., Disp: , Rfl:  .  pravastatin (PRAVACHOL) 40 MG tablet, Take 1 tablet (40 mg total) by mouth at bedtime., Disp: 90 tablet, Rfl: 3 .  promethazine (PHENERGAN) 25 MG tablet, Take 25 mg by mouth every 8 (eight) hours as needed. , Disp: , Rfl: 1 .  Respiratory Therapy Supplies (NEBULIZER) DEVI, Use as directed, Disp: 1 each, Rfl: 0 .  ZOVIRAX 5 %, Apply 1 application topically as needed (flair). , Disp: , Rfl:   Allergies:  Allergies  Allergen Reactions  . Qvar [Beclomethasone]   . Spiriva [Tiotropium Bromide Monohydrate]     rash  . Chlorzoxazone   . Epinephrine     Raises heart rate  . Lamotrigine   . Levofloxacin   . Neurontin [Gabapentin]     arthralgia  . Protriptyline Hcl     Severe constipation  . Ropinirole Hcl     arthralgia  . Tizanidine     panic  . Verapamil   . Zonegran     Mood swings  . Advair Diskus [Fluticasone-Salmeterol] Rash  . Baclofen Rash  . Chlorzoxazone Rash  . Levofloxacin Rash  . Protriptyline Rash  . Ropinirole Rash    Past Medical History, Surgical history, Social history, and Family History were reviewed and updated.  Review of Systems: As above  Physical Exam:  height is  5\' 7"  (1.702 m) and weight is 192 lb (87.1 kg). Her oral temperature is 98.6 F (37 C). Her blood pressure is 136/70 and her pulse is 71.   Well-developed and well-nourished white female. Head and neck exam shows Dr. or oral lesions. She has no palpable cervical or supraclavicular lymph nodes. Lungs are clear bilaterally. Cardiac exam regular in rhythm with no murmurs rubs or bruits. Abdomen is soft. She has good bowel sounds. There is no fluid wave. There  is no palpable liver spleen tip. Back exam shows no tenderness over the spine ribs or hips. Extremities shows no clubbing, cyanosis or edema. She has no rashes. Neurological exam is nonfocal.     Lab Results  Component Value Date   WBC 5.7 12/25/2016   HGB 13.1 12/25/2016   HCT 41.0 12/25/2016   MCV 77 (L) 12/25/2016   PLT 253 12/25/2016     Chemistry      Component Value Date/Time   NA 143 10/22/2016 1110   K 4.6 10/22/2016 1110   CL 104 05/30/2016 1516   CL 102 10/17/2014 1413   CO2 27 10/22/2016 1110   BUN 20.1 10/22/2016 1110   CREATININE 0.8 10/22/2016 1110      Component Value Date/Time   CALCIUM 9.5 10/22/2016 1110   ALKPHOS 76 10/22/2016 1110   AST 23 10/22/2016 1110   ALT 20 10/22/2016 1110   BILITOT 0.35 10/22/2016 1110         Impression and Plan: Ms. Buggy is 66 year-old female with polycythemia. Thankfully, she has not had any neurological issues from this for a long time.   We will go ahead and phlebotomize her. I want to keep her hematocrit below 38%. I think this really will help her out.  I'm not sure what is going on with her eyes. I cannot find anything on her physical exam. I'm sure that she has an eye doctor that she can go see.  We will plan to get her back to see [place] in another 6 weeks.    [name], MD 1/24/20182:36 PM

## 2016-12-26 LAB — IRON AND TIBC
%SAT: 14 % — ABNORMAL LOW (ref 21–57)
IRON: 61 ug/dL (ref 41–142)
TIBC: 444 ug/dL (ref 236–444)
UIBC: 383 ug/dL (ref 120–384)

## 2016-12-26 LAB — FERRITIN: Ferritin: 4 ng/ml — ABNORMAL LOW (ref 9–269)

## 2017-01-08 ENCOUNTER — Ambulatory Visit (INDEPENDENT_AMBULATORY_CARE_PROVIDER_SITE_OTHER)
Admission: RE | Admit: 2017-01-08 | Discharge: 2017-01-08 | Disposition: A | Discharge status: Home / Self Care | Payer: Medicare Other | Source: Ambulatory Visit | Attending: Internal Medicine | Admitting: Internal Medicine

## 2017-01-08 ENCOUNTER — Telehealth: Payer: Self-pay | Admitting: Internal Medicine

## 2017-01-08 DIAGNOSIS — R0789 Other chest pain: Secondary | ICD-10-CM

## 2017-01-08 DIAGNOSIS — W19XXXA Unspecified fall, initial encounter: Secondary | ICD-10-CM

## 2017-01-08 NOTE — Telephone Encounter (Signed)
Patient here with husband. Reports she tripped/ fell 2 days ago hurting/ bruising L chest. Asks CXR.  Please order-CXR [place] [address] hurting l chest

## 2017-01-08 NOTE — Telephone Encounter (Signed)
Order has been placed. Patient is aware.  

## 2017-01-24 ENCOUNTER — Telehealth: Payer: Self-pay | Admitting: Internal Medicine

## 2017-01-24 ENCOUNTER — Ambulatory Visit: Payer: Medicare Other

## 2017-01-24 NOTE — Telephone Encounter (Signed)
Pt had questions about a new PCP in [place] area. She will contact the office to get set up with appt there. Nothing more needed at this time.

## 2017-01-31 ENCOUNTER — Ambulatory Visit: Payer: Medicare Other | Admitting: Family Medicine

## 2017-02-03 ENCOUNTER — Encounter: Payer: Self-pay | Admitting: Family

## 2017-02-03 ENCOUNTER — Encounter: Payer: Self-pay | Admitting: Family Medicine

## 2017-02-03 ENCOUNTER — Other Ambulatory Visit: Payer: Self-pay | Admitting: *Deleted

## 2017-02-03 DIAGNOSIS — D45 Polycythemia vera: Secondary | ICD-10-CM

## 2017-02-03 DIAGNOSIS — D751 Secondary polycythemia: Secondary | ICD-10-CM

## 2017-02-03 DIAGNOSIS — K589 Irritable bowel syndrome without diarrhea: Secondary | ICD-10-CM

## 2017-02-03 MED ORDER — PANTOPRAZOLE SODIUM 40 MG PO TBEC: DELAYED_RELEASE_TABLET | ORAL | 3 refills | Status: AC

## 2017-02-03 MED ORDER — LORAZEPAM 1 MG PO TABS: 1.0000 mg | ORAL_TABLET | Freq: Two times a day (BID) | ORAL | 0 refills | Status: AC

## 2017-02-03 MED ORDER — CLOPIDOGREL BISULFATE 75 MG PO TABS: 75.0000 mg | ORAL_TABLET | Freq: Every day | ORAL | 3 refills | Status: DC

## 2017-02-03 MED ORDER — HYOSCYAMINE SULFATE 0.125 MG PO TBDP: 0.1250 mg | ORAL_TABLET | Freq: Two times a day (BID) | ORAL | 0 refills | Status: DC | PRN

## 2017-02-03 NOTE — Telephone Encounter (Signed)
Rx faxed

## 2017-02-03 NOTE — Telephone Encounter (Signed)
Ok to refill in Dr. Synthia Innocent absence? Last filled 11/21/16 #[id number]. Will need written Rx for mail order.

## 2017-02-06 ENCOUNTER — Ambulatory Visit: Payer: Medicare Other | Admitting: Hematology & Oncology

## 2017-02-06 ENCOUNTER — Other Ambulatory Visit: Payer: Medicare Other

## 2017-02-10 ENCOUNTER — Other Ambulatory Visit: Payer: Self-pay | Admitting: Family

## 2017-02-11 ENCOUNTER — Other Ambulatory Visit (HOSPITAL_COMMUNITY): Payer: Self-pay | Admitting: Family Medicine

## 2017-02-11 DIAGNOSIS — T148XXA Other injury of unspecified body region, initial encounter: Secondary | ICD-10-CM

## 2017-02-14 ENCOUNTER — Telehealth: Payer: Self-pay | Admitting: Internal Medicine

## 2017-02-14 NOTE — Telephone Encounter (Signed)
Spoke with pt. She is only wanting to speak to [facility]. Advised her that I would get this message over to her.

## 2017-02-17 ENCOUNTER — Ambulatory Visit (HOSPITAL_BASED_OUTPATIENT_CLINIC_OR_DEPARTMENT_OTHER): Payer: Medicare Other | Admitting: Hematology & Oncology

## 2017-02-17 ENCOUNTER — Ambulatory Visit (HOSPITAL_BASED_OUTPATIENT_CLINIC_OR_DEPARTMENT_OTHER): Payer: Medicare Other

## 2017-02-17 ENCOUNTER — Encounter: Payer: Self-pay | Admitting: Hematology & Oncology

## 2017-02-17 ENCOUNTER — Other Ambulatory Visit (HOSPITAL_BASED_OUTPATIENT_CLINIC_OR_DEPARTMENT_OTHER): Payer: Medicare Other

## 2017-02-17 VITALS — BP 109/68 | HR 76 | Resp 18

## 2017-02-17 VITALS — BP 128/70 | HR 76 | Temp 98.4°F | Resp 17 | Wt 197.8 lb

## 2017-02-17 DIAGNOSIS — B308 Other viral conjunctivitis: Secondary | ICD-10-CM

## 2017-02-17 DIAGNOSIS — D45 Polycythemia vera: Secondary | ICD-10-CM

## 2017-02-17 LAB — COMPREHENSIVE METABOLIC PANEL
ALBUMIN: 3.8 g/dL (ref 3.5–5.0)
ALK PHOS: 79 U/L (ref 40–150)
ALT: 20 U/L (ref 0–55)
AST: 22 U/L (ref 5–34)
Anion Gap: 6 mEq/L (ref 3–11)
BILIRUBIN TOTAL: 0.39 mg/dL (ref 0.20–1.20)
BUN: 17.8 mg/dL (ref 7.0–26.0)
CO2: 29 mEq/L (ref 22–29)
Calcium: 9.3 mg/dL (ref 8.4–10.4)
Chloride: 106 mEq/L (ref 98–109)
Creatinine: 0.8 mg/dL (ref 0.6–1.1)
EGFR: 75 mL/min/{1.73_m2} — AB (ref >90)
GLUCOSE: 95 mg/dL (ref 70–140)
Potassium: 5.3 mEq/L — ABNORMAL HIGH (ref 3.5–5.1)
SODIUM: 141 meq/L (ref 136–145)
TOTAL PROTEIN: 6.6 g/dL (ref 6.4–8.3)

## 2017-02-17 LAB — CBC WITH DIFFERENTIAL (CANCER CENTER ONLY)
BASO#: 0 10*3/uL (ref 0.0–0.2)
BASO%: 0.5 % (ref 0.0–2.0)
EOS%: 2.5 % (ref 0.0–7.0)
Eosinophils Absolute: 0.1 10*3/uL (ref 0.0–0.5)
HCT: 39 % (ref 34.8–46.6)
HGB: 12.1 g/dL (ref 11.6–15.9)
LYMPH#: 1.7 10*3/uL (ref 0.9–3.3)
LYMPH%: 29.9 % (ref 14.0–48.0)
MCH: 24.2 pg — AB (ref 26.0–34.0)
MCHC: 31 g/dL — AB (ref 32.0–36.0)
MCV: 78 fL — AB (ref 81–101)
MONO#: 0.6 10*3/uL (ref 0.1–0.9)
MONO%: 11.3 % (ref 0.0–13.0)
NEUT%: 55.8 % (ref 39.6–80.0)
NEUTROS ABS: 3.1 10*3/uL (ref 1.5–6.5)
PLATELETS: 243 10*3/uL (ref 145–400)
RBC: 4.99 10*6/uL (ref 3.70–5.32)
RDW: 15.2 % (ref 11.1–15.7)
WBC: 5.6 10*3/uL (ref 3.9–10.0)

## 2017-02-17 NOTE — Progress Notes (Signed)
Hematology and Oncology Follow Up Visit  Natalie Hendrix [id number] 1951-09-20 66 y.o. 02/17/2017   Principle Diagnosis:   Polycythemia vera- JAK2 negative  Current Therapy:    Phlebotomy to maintain hematocrit less than 38%  Plavix 75 mg by mouth daily     Interim History:  Ms.  Strohmeier is back for followup. She recently fell. Thankfully, she did not break anything. She is quite sore. This is mostly on the left side.  She is complaining of pruritus. This seems to happen when her blood count gets on the "high side" for her. She was last phlebotomized about 6 weeks ago.  She definitely is iron deficient. Back in early January, her ferritin was only 4 with an iron saturation of 14%.  She thankfully has had no problem with seizures.  She's had no breathing issues. She is on supplemental oxygen.   Of note, she does feel better when her blood count gets below 38%. She breathes a little bit easier.   She's had no problems with bowels or bladder. She's not noted any leg swelling.   Overall, her performance status is ECOG 2.   Medications:  Current Outpatient Prescriptions:  .  albuterol (PROAIR HFA) 108 (90 Base) MCG/ACT inhaler, Inhale 2 puffs into the lungs every 6 (six) hours as needed for wheezing or shortness of breath., Disp: 3 Inhaler, Rfl: 3 .  albuterol (PROVENTIL) (2.5 MG/3ML) 0.083% nebulizer solution, Take 3 mLs (2.5 mg total) by nebulization every 6 (six) hours as needed for wheezing or shortness of breath., Disp: 1080 mL, Rfl: 1 .  azelastine (ASTELIN) 0.1 % nasal spray, Place 1 spray into both nostrils 2 (two) times daily., Disp: 90 mL, Rfl: 3 .  beclomethasone (BECONASE-AQ) 42 MCG/SPRAY nasal spray, Place 2 sprays into both nostrils 2 (two) times daily. Dose is for each nostril., Disp: 75 g, Rfl: 3 .  benzonatate (TESSALON) 100 MG capsule, Take 1 capsule (100 mg total) by mouth every 6 (six) hours as needed for cough., Disp: 360 capsule, Rfl: 3 .  buPROPion (WELLBUTRIN  XL) 300 MG 24 hr tablet, Take 1 tablet (300 mg total) by mouth daily with breakfast., Disp: 90 tablet, Rfl: 3 .  Calcium Carbonate-Vit D-Min (CALCIUM 1200 PO), Take 2,400 mg by mouth daily. , Disp: , Rfl:  .  clopidogrel (PLAVIX) 75 MG tablet, Take 1 tablet (75 mg total) by mouth daily., Disp: 90 tablet, Rfl: 3 .  co-enzyme Q-10 30 MG capsule, Take 30 mg by mouth daily. , Disp: , Rfl:  .  cyclobenzaprine (FLEXERIL) 5 MG tablet, Take 5 mg by mouth 2 (two) times daily as needed. Phillips, Disp: , Rfl: 0 .  diclofenac (FLECTOR) 1.3 % PTCH, Place 1 patch onto the skin 2 (two) times daily., Disp: , Rfl:  .  ESTRACE VAGINAL 0.1 MG/GM vaginal cream, Place 1 Applicatorful vaginally daily. , Disp: , Rfl:  .  ezetimibe (ZETIA) 10 MG tablet, TAKE 1 TABLET BY MOUTH DAILY., Disp: 90 tablet, Rfl: 3 .  fexofenadine (ALLEGRA) 180 MG tablet, Take 1 tablet (180 mg total) by mouth daily as needed for allergies. As needed, Disp: 90 tablet, Rfl: 3 .  hyoscyamine (ANASPAZ) 0.125 MG TBDP disintergrating tablet, Place 1 tablet (0.125 mg total) under the tongue 2 (two) times daily as needed., Disp: 180 tablet, Rfl: 0 .  lidocaine (LIDODERM) 5 %, Place 1 patch onto the skin as needed (pain). Remove & Discard patch within 12 hours or as directed by MD, Disp: , Rfl:  .  LORazepam (ATIVAN) 1 MG tablet, Take 1 tablet (1 mg total) by mouth 2 (two) times daily. May take extra 3rd dose if needed, Disp: 75 tablet, Rfl: 0 .  magic mouthwash w/lidocaine SOLN, Take 5 mLs by mouth 4 (four) times daily., Disp: 240 mL, Rfl: 3 .  magnesium gluconate (MAGONATE) 500 MG tablet, Take 500 mg by mouth daily as needed (constipation). , Disp: , Rfl:  .  meclizine (ANTIVERT) 25 MG tablet, Take 1 tablet (25 mg total) by mouth 3 (three) times daily as needed for dizziness. 3 month supply, Disp: 60 tablet, Rfl: 0 .  metoprolol succinate (TOPROL-XL) 50 MG 24 hr tablet, Take 1 tablet (50 mg total) by mouth daily with breakfast., Disp: 90 tablet, Rfl: 1 .   Misc Natural Products (TART CHERRY ADVANCED PO), Take 5 mLs by mouth daily. Juice Concentrate- 1tsp daily, Disp: , Rfl:  .  morphine (MS CONTIN) 30 MG 12 hr tablet, Take 15 mg by mouth 2 (two) times daily. , Disp: , Rfl:  .  morphine (MSIR) 15 MG tablet, Take 15 mg by mouth every 4 (four) hours as needed for severe pain., Disp: , Rfl:  .  Multiple Vitamins-Minerals (CENTRUM SILVER) tablet, Take 1 tablet by mouth daily., Disp: , Rfl:  .  neomycin-polymyxin-hydrocortisone (CORTISPORIN) 3.5-10000-1 ophthalmic suspension, Place 2 drops into both eyes 3 (three) times daily., Disp: 7.5 mL, Rfl: 0 .  Omega-3 Fatty Acids (FISH OIL) 1200 MG CAPS, Take 2,400 mg by mouth 2 (two) times daily., Disp: , Rfl:  .  OXYGEN, Inhale 2.5 L/hr into the lungs continuous., Disp: , Rfl:  .  pantoprazole (PROTONIX) 40 MG tablet, TAKE 1 TABLET (40 MG TOTAL) BY MOUTH DAILY., Disp: 90 tablet, Rfl: 3 .  Polyethyl Glycol-Propyl Glycol (SYSTANE) 0.4-0.3 % SOLN, Apply to eye., Disp: , Rfl:  .  pravastatin (PRAVACHOL) 40 MG tablet, Take 1 tablet (40 mg total) by mouth at bedtime., Disp: 90 tablet, Rfl: 3 .  promethazine (PHENERGAN) 25 MG tablet, Take 25 mg by mouth every 8 (eight) hours as needed. , Disp: , Rfl: 1 .  Respiratory Therapy Supplies (NEBULIZER) DEVI, Use as directed, Disp: 1 each, Rfl: 0 .  ZOVIRAX 5 %, Apply 1 application topically as needed (flair). , Disp: , Rfl:   Allergies:  Allergies  Allergen Reactions  . Qvar [Beclomethasone]   . Spiriva [Tiotropium Bromide Monohydrate]     rash  . Chlorzoxazone   . Epinephrine     Raises heart rate  . Lamotrigine   . Levofloxacin   . Neurontin [Gabapentin]     arthralgia  . Protriptyline Hcl     Severe constipation  . Ropinirole Hcl     arthralgia  . Tizanidine     panic  . Verapamil   . Zonegran     Mood swings  . Advair Diskus [Fluticasone-Salmeterol] Rash  . Baclofen Rash  . Chlorzoxazone Rash  . Levofloxacin Rash  . Protriptyline Rash  . Ropinirole  Rash    Past Medical History, Surgical history, Social history, and Family History were reviewed and updated.  Review of Systems: As above  Physical Exam:  weight is 197 lb 12.8 oz (89.7 kg). Her oral temperature is 98.4 F (36.9 C). Her blood pressure is 128/70 and her pulse is 76. Her respiration is 17 and oxygen saturation is 93%.   Well-developed and well-nourished white female. Head and neck exam shows Dr. or oral lesions. She has no palpable cervical or supraclavicular lymph nodes. Lungs  are clear bilaterally. Cardiac exam regular in rhythm with no murmurs rubs or bruits. Abdomen is soft. She has good bowel sounds. There is no fluid wave. There is no palpable liver spleen tip. Back exam shows no tenderness over the spine ribs or hips. Extremities shows no clubbing, cyanosis or edema. She has no rashes. Neurological exam is nonfocal.     Lab Results  Component Value Date   WBC 5.6 02/17/2017   HGB 12.1 02/17/2017   HCT 39.0 02/17/2017   MCV 78 (L) 02/17/2017   PLT 243 02/17/2017     Chemistry      Component Value Date/Time   NA 142 12/25/2016 1330   K 4.8 12/25/2016 1330   CL 104 05/30/2016 1516   CL 102 10/17/2014 1413   CO2 26 12/25/2016 1330   BUN 15.0 12/25/2016 1330   CREATININE 0.8 12/25/2016 1330      Component Value Date/Time   CALCIUM 9.5 12/25/2016 1330   ALKPHOS 72 12/25/2016 1330   AST 24 12/25/2016 1330   ALT 19 12/25/2016 1330   BILITOT 0.49 12/25/2016 1330         Impression and Plan: Ms. Minotti is 66 year-old female with polycythemia. Thankfully, she has not had any neurological issues from this for a long time.   We will go ahead and phlebotomize her. I want to keep her hematocrit below 38%. I think this really will help her out.  She basically came a tutorial on essential oils. She brought in several essential oils. She gave them to me so that I could use them to help relax and to help better meditate. I am very grateful for this.   We will  phlebotomize her. I will see her back in 4 weeks.    [name], MD 3/19/201812:04 PM

## 2017-02-17 NOTE — Progress Notes (Signed)
Natalie Hendrix presents today for phlebotomy per MD orders. Phlebotomy procedure started at 1300 and ended at 1311 364 grams removed via 16G to RAC before clotting off. Second attempt was 20G to R forearm, started at 1315 and ended at 1322 with 145 g removed Patient observed for 30 minutes after procedure without any incident. Patient tolerated procedure well. Patient declined to have IVF today after procedure, "NO I just drink fluids".

## 2017-02-17 NOTE — Patient Instructions (Signed)
Therapeutic Phlebotomy Therapeutic phlebotomy is the controlled removal of blood from a person's body for the purpose of treating a medical condition. The procedure is similar to donating blood. Usually, about a pint (470 mL, or 0.47L) of blood is removed. The average adult has 9-12 pints (4.3-5.7 L) of blood. Therapeutic phlebotomy may be used to treat the following medical conditions:  Hemochromatosis. This is a condition in which the blood contains too much iron.  Polycythemia vera. This is a condition in which the blood contains too many red blood cells.  Porphyria cutanea tarda. This is a disease in which an important part of hemoglobin is not made properly. It results in the buildup of abnormal amounts of porphyrins in the body.  Sickle cell disease. This is a condition in which the red blood cells form an abnormal crescent shape rather than a round shape. Tell a health care provider about:  Any allergies you have.  All medicines you are taking, including vitamins, herbs, eye drops, creams, and over-the-counter medicines.  Any problems you or family members have had with anesthetic medicines.  Any blood disorders you have.  Any surgeries you have had.  Any medical conditions you have. What are the risks? Generally, this is a safe procedure. However, problems may occur, including:  Nausea or light-headedness.  Low blood pressure.  Soreness, bleeding, swelling, or bruising at the needle insertion site.  Infection. What happens before the procedure?  Follow instructions from your health care provider about eating or drinking restrictions.  Ask your health care provider about changing or stopping your regular medicines. This is especially important if you are taking diabetes medicines or blood thinners.  Wear clothing with sleeves that can be raised above the elbow.  Plan to have someone take you home after the procedure.  You may have a blood sample taken. What happens  during the procedure?  A needle will be inserted into one of your veins.  Tubing and a collection bag will be attached to that needle.  Blood will flow through the needle and tubing into the collection bag.  You may be asked to open and close your hand slowly and continually during the entire collection.  After the specified amount of blood has been removed from your body, the collection bag and tubing will be clamped.  The needle will be removed from your vein.  Pressure will be held on the site of the needle insertion to stop the bleeding.  A bandage (dressing) will be placed over the needle insertion site. The procedure may vary among health care providers and hospitals. What happens after the procedure?  Your recovery will be assessed and monitored.  You can return to your normal activities as directed by your health care provider. This information is not intended to replace advice given to you by your health care provider. Make sure you discuss any questions you have with your health care provider. Document Released: 04/22/2011 Document Revised: 07/20/2016 Document Reviewed: 11/14/2014 Elsevier Interactive Patient Education  2017 [employer]  

## 2017-02-18 LAB — IRON AND TIBC
%SAT: 11 % — ABNORMAL LOW (ref 21–57)
IRON: 50 ug/dL (ref 41–142)
TIBC: 466 ug/dL — AB (ref 236–444)
UIBC: 416 ug/dL — AB (ref 120–384)

## 2017-02-18 LAB — FERRITIN: FERRITIN: 4 ng/mL — AB (ref 9–269)

## 2017-02-18 NOTE — Telephone Encounter (Signed)
LMTCB-she can speak with any nurse from Triage

## 2017-02-18 NOTE — Telephone Encounter (Signed)
Natalie Hendrix pt is requesting to speak with you only.  Please advise if we may close this message once completed.

## 2017-02-20 NOTE — Telephone Encounter (Signed)
Ok- please note on chart

## 2017-02-20 NOTE — Telephone Encounter (Signed)
Spoke with pt, who states she would like to make Katie and CY aware that she has decided on a PCP. Threasa Alpha, PA Nothing further needed.  Will route to CY and Katie as a FYI.

## 2017-03-04 ENCOUNTER — Encounter (HOSPITAL_COMMUNITY): Payer: Medicare Other

## 2017-03-07 ENCOUNTER — Ambulatory Visit: Payer: Medicare Other | Admitting: Family Medicine

## 2017-03-31 ENCOUNTER — Ambulatory Visit (HOSPITAL_BASED_OUTPATIENT_CLINIC_OR_DEPARTMENT_OTHER): Payer: Medicare Other | Admitting: Hematology & Oncology

## 2017-03-31 ENCOUNTER — Other Ambulatory Visit (HOSPITAL_BASED_OUTPATIENT_CLINIC_OR_DEPARTMENT_OTHER): Payer: Medicare Other

## 2017-03-31 ENCOUNTER — Ambulatory Visit (HOSPITAL_BASED_OUTPATIENT_CLINIC_OR_DEPARTMENT_OTHER): Payer: Medicare Other

## 2017-03-31 VITALS — BP 155/50 | HR 76 | Temp 97.8°F | Resp 18 | Wt 194.8 lb

## 2017-03-31 VITALS — BP 113/64 | HR 72

## 2017-03-31 DIAGNOSIS — E611 Iron deficiency: Secondary | ICD-10-CM | POA: Diagnosis not present

## 2017-03-31 DIAGNOSIS — D45 Polycythemia vera: Secondary | ICD-10-CM | POA: Diagnosis not present

## 2017-03-31 LAB — CBC WITH DIFFERENTIAL (CANCER CENTER ONLY)
BASO#: 0 10*3/uL (ref 0.0–0.2)
BASO%: 0.4 % (ref 0.0–2.0)
EOS ABS: 0.1 10*3/uL (ref 0.0–0.5)
EOS%: 2.6 % (ref 0.0–7.0)
HEMATOCRIT: 38.3 % (ref 34.8–46.6)
HEMOGLOBIN: 11.9 g/dL (ref 11.6–15.9)
LYMPH#: 1.2 10*3/uL (ref 0.9–3.3)
LYMPH%: 26.2 % (ref 14.0–48.0)
MCH: 24.1 pg — AB (ref 26.0–34.0)
MCHC: 31.1 g/dL — AB (ref 32.0–36.0)
MCV: 78 fL — ABNORMAL LOW (ref 81–101)
MONO#: 0.5 10*3/uL (ref 0.1–0.9)
MONO%: 11.3 % (ref 0.0–13.0)
NEUT%: 59.5 % (ref 39.6–80.0)
NEUTROS ABS: 2.7 10*3/uL (ref 1.5–6.5)
Platelets: 266 10*3/uL (ref 145–400)
RBC: 4.94 10*6/uL (ref 3.70–5.32)
RDW: 15.2 % (ref 11.1–15.7)
WBC: 4.6 10*3/uL (ref 3.9–10.0)

## 2017-03-31 LAB — CMP (CANCER CENTER ONLY)
ALBUMIN: 3.4 g/dL (ref 3.3–5.5)
ALT(SGPT): 26 U/L (ref 10–47)
AST: 27 U/L (ref 11–38)
Alkaline Phosphatase: 73 U/L (ref 26–84)
BUN, Bld: 16 mg/dL (ref 7–22)
CALCIUM: 9.1 mg/dL (ref 8.0–10.3)
CHLORIDE: 103 meq/L (ref 98–108)
CO2: 30 meq/L (ref 18–33)
Creat: 0.9 mg/dl (ref 0.6–1.2)
Glucose, Bld: 103 mg/dL (ref 73–118)
Potassium: 4.2 mEq/L (ref 3.3–4.7)
SODIUM: 141 meq/L (ref 128–145)
Total Bilirubin: 0.6 mg/dl (ref 0.20–1.60)
Total Protein: 6.3 g/dL — ABNORMAL LOW (ref 6.4–8.1)

## 2017-03-31 LAB — CHCC SATELLITE - SMEAR

## 2017-03-31 LAB — IRON AND TIBC
%SAT: 10 % — AB (ref 21–57)
Iron: 47 ug/dL (ref 41–142)
TIBC: 473 ug/dL — ABNORMAL HIGH (ref 236–444)
UIBC: 425 ug/dL — AB (ref 120–384)

## 2017-03-31 LAB — FERRITIN

## 2017-03-31 NOTE — Progress Notes (Signed)
Natalie Hendrix presents today for phlebotomy per MD orders. Phlebotomy procedure started at 1245 and ended at 1255 And 290 grams removed (Dr. Only wanted a half phlebotomy today) via 20G to R wrist.  Patient observed for 30 minutes after procedure without any incident. Diet and nutrition offered. Declined IVF Patient tolerated procedure well. IV needle removed intact.

## 2017-03-31 NOTE — Progress Notes (Signed)
Hematology and Oncology Follow Up Visit  Natalie Hendrix [id number] July 15, 1951 66 y.o. 03/31/2017   Principle Diagnosis:   Polycythemia vera- JAK2 negative  Current Therapy:    Phlebotomy to maintain hematocrit less than 38%  Plavix 75 mg by mouth daily     Interim History:  Natalie Hendrix is back for followup. She is doing pretty well. Not surprising, her husband is not doing too well. He comes in with a neck collar on. He apparently passed out. He has aortic stenosis. He is going to need a cardiac cath.  I'll we last saw Natalie Hendrix, her iron studies showed iron deficiency with a ferritin of 4 and iron saturation 11%. We want to keep her iron deficient.  The phlebotomy we gave her last time seemed to help her. She's really had no neurological issues.  She is oxygen dependent. Her pulmonary status has been holding pretty stable.   She has had no fever. She has had no bleeding. She has had no change in bowel or bladder habits.   Overall, her performance status is ECOG 2.   Medications:  Current Outpatient Prescriptions:  .  albuterol (PROAIR HFA) 108 (90 Base) MCG/ACT inhaler, Inhale 2 puffs into the lungs every 6 (six) hours as needed for wheezing or shortness of breath., Disp: 3 Inhaler, Rfl: 3 .  albuterol (PROVENTIL) (2.5 MG/3ML) 0.083% nebulizer solution, Take 3 mLs (2.5 mg total) by nebulization every 6 (six) hours as needed for wheezing or shortness of breath., Disp: 1080 mL, Rfl: 1 .  azelastine (ASTELIN) 0.1 % nasal spray, Place 1 spray into both nostrils 2 (two) times daily., Disp: 90 mL, Rfl: 3 .  beclomethasone (BECONASE-AQ) 42 MCG/SPRAY nasal spray, Place 2 sprays into both nostrils 2 (two) times daily. Dose is for each nostril., Disp: 75 g, Rfl: 3 .  benzonatate (TESSALON) 100 MG capsule, Take 1 capsule (100 mg total) by mouth every 6 (six) hours as needed for cough., Disp: 360 capsule, Rfl: 3 .  buPROPion (WELLBUTRIN XL) 300 MG 24 hr tablet, Take 1 tablet (300 mg total) by  mouth daily with breakfast., Disp: 90 tablet, Rfl: 3 .  Calcium Carbonate-Vit D-Min (CALCIUM 1200 PO), Take 2,400 mg by mouth daily. , Disp: , Rfl:  .  clopidogrel (PLAVIX) 75 MG tablet, Take 1 tablet (75 mg total) by mouth daily., Disp: 90 tablet, Rfl: 3 .  co-enzyme Q-10 30 MG capsule, Take 30 mg by mouth daily. , Disp: , Rfl:  .  cyclobenzaprine (FLEXERIL) 5 MG tablet, Take 5 mg by mouth 2 (two) times daily as needed. Natalie Hendrix, Disp: , Rfl: 0 .  diclofenac (FLECTOR) 1.3 % PTCH, Place 1 patch onto the skin 2 (two) times daily., Disp: , Rfl:  .  ESTRACE VAGINAL 0.1 MG/GM vaginal cream, Place 1 Applicatorful vaginally daily. , Disp: , Rfl:  .  ezetimibe (ZETIA) 10 MG tablet, TAKE 1 TABLET BY MOUTH DAILY., Disp: 90 tablet, Rfl: 3 .  fexofenadine (ALLEGRA) 180 MG tablet, Take 1 tablet (180 mg total) by mouth daily as needed for allergies. As needed, Disp: 90 tablet, Rfl: 3 .  hyoscyamine (ANASPAZ) 0.125 MG TBDP disintergrating tablet, Place 1 tablet (0.125 mg total) under the tongue 2 (two) times daily as needed., Disp: 180 tablet, Rfl: 0 .  lidocaine (LIDODERM) 5 %, Place 1 patch onto the skin as needed (pain). Remove & Discard patch within 12 hours or as directed by MD, Disp: , Rfl:  .  LORazepam (ATIVAN) 1 MG  tablet, Take 1 tablet (1 mg total) by mouth 2 (two) times daily. May take extra 3rd dose if needed, Disp: 75 tablet, Rfl: 0 .  magic mouthwash w/lidocaine SOLN, Take 5 mLs by mouth 4 (four) times daily., Disp: 240 mL, Rfl: 3 .  magnesium gluconate (MAGONATE) 500 MG tablet, Take 500 mg by mouth daily as needed (constipation). , Disp: , Rfl:  .  meclizine (ANTIVERT) 25 MG tablet, Take 1 tablet (25 mg total) by mouth 3 (three) times daily as needed for dizziness. 3 month supply, Disp: 60 tablet, Rfl: 0 .  metoprolol succinate (TOPROL-XL) 50 MG 24 hr tablet, Take 1 tablet (50 mg total) by mouth daily with breakfast., Disp: 90 tablet, Rfl: 1 .  Misc Natural Products (TART CHERRY ADVANCED PO), Take 5  mLs by mouth daily. Juice Concentrate- 1tsp daily, Disp: , Rfl:  .  morphine (MS CONTIN) 30 MG 12 hr tablet, , Disp: , Rfl: 0 .  morphine (MSIR) 15 MG tablet, Take 15 mg by mouth every 4 (four) hours as needed for severe pain., Disp: , Rfl:  .  Multiple Vitamins-Minerals (CENTRUM SILVER) tablet, Take 1 tablet by mouth daily., Disp: , Rfl:  .  neomycin-polymyxin-hydrocortisone (CORTISPORIN) 3.5-10000-1 ophthalmic suspension, Place 2 drops into both eyes 3 (three) times daily., Disp: 7.5 mL, Rfl: 0 .  Omega-3 Fatty Acids (FISH OIL) 1200 MG CAPS, Take 2,400 mg by mouth 2 (two) times daily., Disp: , Rfl:  .  OXYGEN, Inhale 2.5 L/hr into the lungs continuous., Disp: , Rfl:  .  pantoprazole (PROTONIX) 40 MG tablet, TAKE 1 TABLET (40 MG TOTAL) BY MOUTH DAILY., Disp: 90 tablet, Rfl: 3 .  Polyethyl Glycol-Propyl Glycol (SYSTANE) 0.4-0.3 % SOLN, Apply to eye., Disp: , Rfl:  .  pravastatin (PRAVACHOL) 40 MG tablet, Take 1 tablet (40 mg total) by mouth at bedtime., Disp: 90 tablet, Rfl: 3 .  promethazine (PHENERGAN) 25 MG tablet, Take 25 mg by mouth every 8 (eight) hours as needed. , Disp: , Rfl: 1 .  Respiratory Therapy Supplies (NEBULIZER) DEVI, Use as directed, Disp: 1 each, Rfl: 0 .  ZOVIRAX 5 %, Apply 1 application topically as needed (flair). , Disp: , Rfl:   Allergies:  Allergies  Allergen Reactions  . Qvar [Beclomethasone]   . Spiriva [Tiotropium Bromide Monohydrate]     rash  . Chlorzoxazone   . Epinephrine     Raises heart rate  . Lamotrigine   . Levofloxacin   . Neurontin [Gabapentin]     arthralgia  . Protriptyline Hcl     Severe constipation  . Ropinirole Hcl     arthralgia  . Tizanidine     panic  . Verapamil   . Zonegran     Mood swings  . Advair Diskus [Fluticasone-Salmeterol] Rash  . Baclofen Rash  . Chlorzoxazone Rash  . Levofloxacin Rash  . Protriptyline Rash  . Ropinirole Rash    Past Medical History, Surgical history, Social history, and Family History were  reviewed and updated.  Review of Systems: As above  Physical Exam:  weight is 194 lb 12.8 oz (88.4 kg). Her oral temperature is 97.8 F (36.6 C). Her blood pressure is 155/50 (abnormal) and her pulse is 76. Her respiration is 18 and oxygen saturation is 100%.   Well-developed and well-nourished white female. Head and neck exam shows Dr. or oral lesions. She has no palpable cervical or supraclavicular lymph nodes. Lungs are clear bilaterally. Cardiac exam regular in rhythm with no murmurs rubs  or bruits. Abdomen is soft. She has good bowel sounds. There is no fluid wave. There is no palpable liver spleen tip. Back exam shows no tenderness over the spine ribs or hips. Extremities shows no clubbing, cyanosis or edema. She has no rashes. Neurological exam is nonfocal.     Lab Results  Component Value Date   WBC 4.6 03/31/2017   HGB 11.9 03/31/2017   HCT 38.3 03/31/2017   MCV 78 (L) 03/31/2017   PLT 266 03/31/2017     Chemistry      Component Value Date/Time   NA 141 02/17/2017 1132   K 5.3 (H) 02/17/2017 1132   CL 104 05/30/2016 1516   CL 102 10/17/2014 1413   CO2 29 02/17/2017 1132   BUN 17.8 02/17/2017 1132   CREATININE 0.8 02/17/2017 1132      Component Value Date/Time   CALCIUM 9.3 02/17/2017 1132   ALKPHOS 79 02/17/2017 1132   AST 22 02/17/2017 1132   ALT 20 02/17/2017 1132   BILITOT 0.39 02/17/2017 1132         Impression and Plan: Ms. Baucum is 66 year-old female with polycythemia. Thankfully, she has not had any neurological issues from this for a long time.   We will go ahead and phlebotomize her. I want to keep her hematocrit below 38%. I think this really will help her out.  We will phlebotomize her. I will see her back in 4 weeks.    [name], MD 4/30/201811:42 AM

## 2017-03-31 NOTE — Patient Instructions (Signed)
Therapeutic Phlebotomy Therapeutic phlebotomy is the controlled removal of blood from a person's body for the purpose of treating a medical condition. The procedure is similar to donating blood. Usually, about a pint (470 mL, or 0.47L) of blood is removed. The average adult has 9-12 pints (4.3-5.7 L) of blood. Therapeutic phlebotomy may be used to treat the following medical conditions:  Hemochromatosis. This is a condition in which the blood contains too much iron.  Polycythemia vera. This is a condition in which the blood contains too many red blood cells.  Porphyria cutanea tarda. This is a disease in which an important part of hemoglobin is not made properly. It results in the buildup of abnormal amounts of porphyrins in the body.  Sickle cell disease. This is a condition in which the red blood cells form an abnormal crescent shape rather than a round shape. Tell a health care provider about:  Any allergies you have.  All medicines you are taking, including vitamins, herbs, eye drops, creams, and over-the-counter medicines.  Any problems you or family members have had with anesthetic medicines.  Any blood disorders you have.  Any surgeries you have had.  Any medical conditions you have. What are the risks? Generally, this is a safe procedure. However, problems may occur, including:  Nausea or light-headedness.  Low blood pressure.  Soreness, bleeding, swelling, or bruising at the needle insertion site.  Infection. What happens before the procedure?  Follow instructions from your health care provider about eating or drinking restrictions.  Ask your health care provider about changing or stopping your regular medicines. This is especially important if you are taking diabetes medicines or blood thinners.  Wear clothing with sleeves that can be raised above the elbow.  Plan to have someone take you home after the procedure.  You may have a blood sample taken. What happens  during the procedure?  A needle will be inserted into one of your veins.  Tubing and a collection bag will be attached to that needle.  Blood will flow through the needle and tubing into the collection bag.  You may be asked to open and close your hand slowly and continually during the entire collection.  After the specified amount of blood has been removed from your body, the collection bag and tubing will be clamped.  The needle will be removed from your vein.  Pressure will be held on the site of the needle insertion to stop the bleeding.  A bandage (dressing) will be placed over the needle insertion site. The procedure may vary among health care providers and hospitals. What happens after the procedure?  Your recovery will be assessed and monitored.  You can return to your normal activities as directed by your health care provider. This information is not intended to replace advice given to you by your health care provider. Make sure you discuss any questions you have with your health care provider. Document Released: 04/22/2011 Document Revised: 07/20/2016 Document Reviewed: 11/14/2014 Elsevier Interactive Patient Education  2017 [employer]  

## 2017-04-16 ENCOUNTER — Other Ambulatory Visit: Payer: Self-pay | Admitting: Family Medicine

## 2017-05-05 ENCOUNTER — Other Ambulatory Visit: Payer: Medicare Other

## 2017-05-05 ENCOUNTER — Ambulatory Visit: Payer: Medicare Other | Admitting: Hematology & Oncology

## 2017-05-09 ENCOUNTER — Ambulatory Visit: Payer: Medicare Other | Admitting: Family

## 2017-05-09 ENCOUNTER — Other Ambulatory Visit: Payer: Medicare Other

## 2017-05-12 ENCOUNTER — Encounter (HOSPITAL_COMMUNITY): Payer: Self-pay | Admitting: Emergency Medicine

## 2017-05-12 ENCOUNTER — Emergency Department (HOSPITAL_COMMUNITY): Payer: Medicare Other

## 2017-05-12 ENCOUNTER — Emergency Department (HOSPITAL_COMMUNITY)
Admission: EM | Admit: 2017-05-12 | Discharge: 2017-05-12 | Disposition: A | Discharge status: Home / Self Care | Payer: Medicare Other | Attending: Emergency Medicine | Admitting: Emergency Medicine

## 2017-05-12 DIAGNOSIS — D45 Polycythemia vera: Secondary | ICD-10-CM | POA: Diagnosis not present

## 2017-05-12 DIAGNOSIS — J45909 Unspecified asthma, uncomplicated: Secondary | ICD-10-CM | POA: Diagnosis not present

## 2017-05-12 DIAGNOSIS — G43809 Other migraine, not intractable, without status migrainosus: Secondary | ICD-10-CM | POA: Diagnosis not present

## 2017-05-12 DIAGNOSIS — Z79899 Other long term (current) drug therapy: Secondary | ICD-10-CM | POA: Insufficient documentation

## 2017-05-12 DIAGNOSIS — I10 Essential (primary) hypertension: Secondary | ICD-10-CM | POA: Diagnosis not present

## 2017-05-12 DIAGNOSIS — R4701 Aphasia: Secondary | ICD-10-CM | POA: Diagnosis not present

## 2017-05-12 DIAGNOSIS — G43109 Migraine with aura, not intractable, without status migrainosus: Secondary | ICD-10-CM

## 2017-05-12 LAB — DIFFERENTIAL
BASOS ABS: 0 10*3/uL (ref 0.0–0.1)
BASOS PCT: 1 %
EOS ABS: 0.2 10*3/uL (ref 0.0–0.7)
Eosinophils Relative: 3 %
Lymphocytes Relative: 31 %
Lymphs Abs: 2 10*3/uL (ref 0.7–4.0)
Monocytes Absolute: 0.5 10*3/uL (ref 0.1–1.0)
Monocytes Relative: 8 %
Neutro Abs: 3.8 10*3/uL (ref 1.7–7.7)
Neutrophils Relative %: 57 %

## 2017-05-12 LAB — URINALYSIS, ROUTINE W REFLEX MICROSCOPIC
BILIRUBIN URINE: NEGATIVE
Glucose, UA: NEGATIVE mg/dL
HGB URINE DIPSTICK: NEGATIVE
KETONES UR: NEGATIVE mg/dL
NITRITE: NEGATIVE
PROTEIN: NEGATIVE mg/dL
Specific Gravity, Urine: 1.019 (ref 1.005–1.030)
pH: 5 (ref 5.0–8.0)

## 2017-05-12 LAB — RAPID URINE DRUG SCREEN, HOSP PERFORMED
Amphetamines: NOT DETECTED
Barbiturates: NOT DETECTED
Benzodiazepines: POSITIVE — AB
Cocaine: NOT DETECTED
OPIATES: POSITIVE — AB
TETRAHYDROCANNABINOL: NOT DETECTED

## 2017-05-12 LAB — I-STAT CHEM 8, ED
BUN: 17 mg/dL (ref 6–20)
Calcium, Ion: 1.19 mmol/L (ref 1.15–1.40)
Chloride: 102 mmol/L (ref 101–111)
Creatinine, Ser: 0.9 mg/dL (ref 0.44–1.00)
Glucose, Bld: 93 mg/dL (ref 65–99)
HCT: 37 % (ref 36.0–46.0)
Hemoglobin: 12.6 g/dL (ref 12.0–15.0)
Potassium: 4.2 mmol/L (ref 3.5–5.1)
SODIUM: 140 mmol/L (ref 135–145)
TCO2: 27 mmol/L (ref 0–100)

## 2017-05-12 LAB — COMPREHENSIVE METABOLIC PANEL
ALBUMIN: 4.1 g/dL (ref 3.5–5.0)
ALT: 23 U/L (ref 14–54)
ANION GAP: 7 (ref 5–15)
AST: 26 U/L (ref 15–41)
Alkaline Phosphatase: 66 U/L (ref 38–126)
BUN: 17 mg/dL (ref 6–20)
CHLORIDE: 106 mmol/L (ref 101–111)
CO2: 27 mmol/L (ref 22–32)
Calcium: 9.1 mg/dL (ref 8.9–10.3)
Creatinine, Ser: 0.83 mg/dL (ref 0.44–1.00)
GFR calc Af Amer: >60 mL/min (ref >60)
GLUCOSE: 95 mg/dL (ref 65–99)
POTASSIUM: 4.3 mmol/L (ref 3.5–5.1)
Sodium: 140 mmol/L (ref 135–145)
Total Bilirubin: 0.2 mg/dL — ABNORMAL LOW (ref 0.3–1.2)
Total Protein: 6.9 g/dL (ref 6.5–8.1)

## 2017-05-12 LAB — ETHANOL

## 2017-05-12 LAB — CBC
HCT: 39 % (ref 36.0–46.0)
Hemoglobin: 12 g/dL (ref 12.0–15.0)
MCH: 23.2 pg — ABNORMAL LOW (ref 26.0–34.0)
MCHC: 30.8 g/dL (ref 30.0–36.0)
MCV: 75.4 fL — ABNORMAL LOW (ref 78.0–100.0)
Platelets: 266 10*3/uL (ref 150–400)
RBC: 5.17 MIL/uL — ABNORMAL HIGH (ref 3.87–5.11)
RDW: 15.8 % — AB (ref 11.5–15.5)
WBC: 6.5 10*3/uL (ref 4.0–10.5)

## 2017-05-12 LAB — I-STAT TROPONIN, ED: TROPONIN I, POC: 0 ng/mL (ref 0.00–0.08)

## 2017-05-12 LAB — PROTIME-INR
INR: 0.97
PROTHROMBIN TIME: 12.9 s (ref 11.4–15.2)

## 2017-05-12 LAB — APTT: APTT: 31 s (ref 24–36)

## 2017-05-12 LAB — CBG MONITORING, ED: GLUCOSE-CAPILLARY: 122 mg/dL — AB (ref 65–99)

## 2017-05-12 MED ORDER — DEXAMETHASONE SODIUM PHOSPHATE 10 MG/ML IJ SOLN
10.0000 mg | Freq: Once | INTRAMUSCULAR | Status: AC
  Administered 2017-05-12: 10 mg via INTRAVENOUS
  Filled 2017-05-12: qty 1

## 2017-05-12 MED ORDER — DIPHENHYDRAMINE HCL 50 MG/ML IJ SOLN
25.0000 mg | Freq: Once | INTRAMUSCULAR | Status: AC
Start: 2017-05-12 — End: 2017-05-12
  Administered 2017-05-12: 25 mg via INTRAVENOUS
  Filled 2017-05-12: qty 1

## 2017-05-12 MED ORDER — SODIUM CHLORIDE 0.9 % IV SOLN: INTRAVENOUS | Status: DC

## 2017-05-12 MED ORDER — PROMETHAZINE HCL 25 MG/ML IJ SOLN
12.5000 mg | Freq: Once | INTRAMUSCULAR | Status: AC
  Administered 2017-05-12: 12.5 mg via INTRAVENOUS
  Filled 2017-05-12: qty 1

## 2017-05-12 MED ORDER — SODIUM CHLORIDE 0.9 % IV BOLUS (SEPSIS)
500.0000 mL | Freq: Once | INTRAVENOUS | Status: AC
  Administered 2017-05-12: 500 mL via INTRAVENOUS

## 2017-05-12 MED ORDER — DIPHENHYDRAMINE HCL 25 MG PO CAPS
25.0000 mg | ORAL_CAPSULE | Freq: Once | ORAL | Status: DC
  Filled 2017-05-12: qty 1

## 2017-05-12 NOTE — ED Provider Notes (Signed)
Care assumed from Dr. Rogene Houston at shift change. Patient presented here with slurred speech, weakness, and headache. She has a history of migraines, however this seemed different. An MRI was obtained of the brain to rule out the possibility of stroke and returned as negative.  The patient is feeling better and is now back to her neurologic baseline. I suspect the etiology of her complaints are a complex migraine. She will be discharged, to follow-up as needed.  Of note is that her hematocrit is 39 and she has a history of polycythemia vera. She is to follow-up with Dr. Marin Olp tomorrow.   Veryl Speak, MD 05/12/17 1859

## 2017-05-12 NOTE — Discharge Instructions (Signed)
Follow-up with Dr. Marin Olp tomorrow.  Continue your medications as before, and return to the emergency department if your symptoms significantly worsen or change.

## 2017-05-12 NOTE — ED Provider Notes (Signed)
[facility] DEPT Provider Note   CSN: [id number] Arrival date & time: 05/12/17  1409     History   Chief Complaint Chief Complaint  Patient presents with  . Aphasia    HPI Natalie Hendrix is a 66 y.o. female.  She with a complicated history. Patient has a history of Migraines. Patient's had a history of strokes in the past. Patient also has a history of polycythemia vera. Patient followed by hematology oncology for this. Patient last seen by them in April. Patient went to her pulmonary doctors office today for her bronchiectasis and on the way there started to have speech problems. Referred here for concerns for stroke. Patient also has had a headache for the past few days. Husband states she did have some speech problems yesterday this can happen with her migraines. Also has happened with stroke. Patient without any extremity abnormalities or any neuro focal deficits other than speech problems. Patient does have photophobia with the headache. Headache is all over.      Past Medical History:  Diagnosis Date  . Allergic rhinitis   . Allergy   . Asthma    uses oxygen 2.5 l/m 24/7, sleeps elevated  . Complication of anesthesia    very sensitive to sedatives, B/P drops  . CVA (cerebral vascular accident) ([place]) 1999, 2014   residual left side weakness, dysphagia, word finding difficulty, and short term memory; uses mobile chair, cane  . Depression   . Dizziness   . Fibromyalgia   . Fluttering heart   . GERD (gastroesophageal reflux disease)   . Hyperlipidemia   . Hypertension   . Migraine headache   . Polycythemia rubra vera ([place])   . Thoracic outlet syndrome 1981   repair  . Vertigo     Patient Active Problem List   Diagnosis Date Noted  . Prediabetes 10/18/2016  . Loss of memory 08/29/2016  . Chronic respiratory failure with hypoxia ([place]) 07/14/2015  . Sinusitis, acute maxillary 07/14/2015  . Aphasia 05/10/2014  . Chronic tension-type headache, not intractable  05/10/2014  . Cognitive change 05/10/2014  . Difficulty walking 05/10/2014  . Dysphagia 05/10/2014  . Vertigo as late effect of stroke 05/10/2014  . DERMATOFIBROMA 10/03/2008  . FRACTURE, ANKLE, LEFT 10/03/2008  . INTERMITTENT VERTIGO 08/30/2008  . MDD (major depressive disorder), recurrent episode, moderate ([place]) 10/20/2007  . Migraine without aura 10/20/2007  . [place] DISEASE, LUMBAR 10/20/2007  . Polycythemia vera ([place]) 07/04/2007  . HLD (hyperlipidemia) 07/04/2007  . Obesity, Class I, BMI 30-34.9 07/04/2007  . Essential hypertension 07/04/2007  . Seasonal and perennial allergic rhinitis 07/04/2007  . Asthma, mild intermittent 07/04/2007  . GERD 07/04/2007  . UTERINE POLYP 07/04/2007  . Fibromyalgia 07/04/2007  . History of CVA (cerebrovascular accident) 07/04/2007  . THORACIC OUTLET SYNDROME, HX OF 07/04/2007    Past Surgical History:  Procedure Laterality Date  . APPENDECTOMY    . BALLOON DILATION N/A 10/27/2013   Procedure: BALLOON DILATION;  Surgeon: Arta Silence, MD;  Location: [facility] END[facility]COPY;  Service: Endoscopy;  Laterality: N/A;  . BREAST SURGERY  1979  . CYST REMOVAL HAND Bilateral    left done 10-13-13(Dr. Gramig)  . DILATION AND CURETTAGE OF UTERUS    . ESOPHAGOGASTRODUODENOSCOPY (EGD) WITH PROPOFOL N/A 10/27/2013   Procedure: ESOPHAGOGASTRODUODENOSCOPY (EGD) WITH PROPOFOL;  Surgeon: Arta Silence, MD;  Location: [facility] END[facility]COPY;  Service: Endoscopy;  Laterality: N/A;  . GALLBLADDER SURGERY    . RESECTION RIB PARTIAL    . RHINOPLASTY    . Root  Resection and Revascularization  1980   of Long thoracic artery  . synonectomy      OB History    No data available       Home Medications    Prior to Admission medications   Medication Sig Start Date End Date Taking? Authorizing Provider  albuterol (PROAIR HFA) 108 (90 Base) MCG/ACT inhaler Inhale 2 puffs into the lungs every 6 (six) hours as needed for wheezing or shortness of breath. 12/01/15  Yes Young, Tarri Fuller  D, MD  albuterol (PROVENTIL) (2.5 MG/3ML) 0.083% nebulizer solution Take 3 mLs (2.5 mg total) by nebulization every 6 (six) hours as needed for wheezing or shortness of breath. 09/27/16 09/21/18 Yes Young, Clinton D, MD  azelastine (ASTELIN) 0.1 % nasal spray Place 1 spray into both nostrils 2 (two) times daily. 11/21/16  Yes Young, Tarri Fuller D, MD  benzonatate (TESSALON) 100 MG capsule Take 1 capsule (100 mg total) by mouth every 6 (six) hours as needed for cough. 11/21/16  Yes Baird Lyons D, MD  buPROPion (WELLBUTRIN XL) 300 MG 24 hr tablet Take 1 tablet (300 mg total) by mouth daily with breakfast. 08/16/16  Yes Ria Bush, MD  Calcium Carbonate-Vit D-Natalie (CALCIUM 1200 PO) Take 2,400 mg by mouth daily.    Yes [provider]  clopidogrel (PLAVIX) 75 MG tablet Take 1 tablet (75 mg total) by mouth daily. 02/03/17  Yes [name], MD  co-enzyme Q-10 30 MG capsule Take 30 mg by mouth daily.    Yes [provider]  cyclobenzaprine (FLEXERIL) 5 MG tablet Take 5 mg by mouth 2 (two) times daily as needed for muscle spasms. [name] 02/23/16  Yes [provider]  ezetimibe (ZETIA) 10 MG tablet TAKE 1 TABLET BY MOUTH DAILY. 12/09/16  Yes Ria Bush, MD  lidocaine (LIDODERM) 5 % Place 1 patch onto the skin as needed (pain). Remove & Discard patch within 12 hours or as directed by MD   Yes [provider]  LORazepam (ATIVAN) 1 MG tablet Take 1 tablet (1 mg total) by mouth 2 (two) times daily. May take extra 3rd dose if needed 02/03/17  Yes Pleas Koch, NP  magnesium gluconate (MAGONATE) 500 MG tablet Take 500 mg by mouth daily as needed (constipation).    Yes [provider]  meclizine (ANTIVERT) 25 MG tablet Take 1 tablet (25 mg total) by mouth 3 (three) times daily as needed for dizziness. 3 month supply Patient taking differently: Take 25 mg by mouth 3 (three) times daily. 3 month supply 11/22/16  Yes Ennever, Rudell Cobb, MD  metoprolol succinate  (TOPROL-XL) 50 MG 24 hr tablet Take 1 tablet (50 mg total) by mouth daily with breakfast. 11/07/16  Yes Ria Bush, MD  Misc Natural Products (TART CHERRY ADVANCED PO) Take 5 mLs by mouth daily. Juice Concentrate- 1tsp daily   Yes [provider]  morphine (MS CONTIN) 30 MG 12 hr tablet Take 30 mg by mouth every 12 (twelve) hours.  03/18/17  Yes [provider]  morphine (MSIR) 15 MG tablet Take 15 mg by mouth every 4 (four) hours as needed for severe pain.   Yes [provider]  Multiple Vitamins-Minerals (CENTRUM SILVER) tablet Take 1 tablet by mouth daily.   Yes [provider]  Omega-3 Fatty Acids (FISH OIL) 1200 MG CAPS Take 1,200 mg by mouth at bedtime.    Yes [provider]  OXYGEN Inhale 2.5 L/hr into the lungs continuous.   Yes [provider]  pantoprazole (PROTONIX) 40 MG tablet TAKE 1 TABLET (40 MG TOTAL) BY MOUTH DAILY. 02/03/17  Yes Ria Bush, MD  Polyethyl Glycol-Propyl Glycol (SYSTANE) 0.4-0.3 % SOLN Apply 1 drop to eye daily as needed (dry eyes).    Yes [provider]  pravastatin (PRAVACHOL) 40 MG tablet Take 1 tablet (40 mg total) by mouth at bedtime. 12/09/16  Yes Ria Bush, MD  promethazine (PHENERGAN) 25 MG tablet Take 25 mg by mouth every 8 (eight) hours as needed for nausea or vomiting.  01/09/15  Yes [provider]  beclomethasone (BECONASE-AQ) 42 MCG/SPRAY nasal spray Place 2 sprays into both nostrils 2 (two) times daily. Dose is for each nostril. 06/17/14 12/12/16  [name], MD  ESTRACE VAGINAL 0.1 MG/GM vaginal cream Place 1 Applicatorful vaginally daily.  06/24/12   [provider]  fexofenadine (ALLEGRA) 180 MG tablet Take 1 tablet (180 mg total) by mouth daily as needed for allergies. As needed 05/09/14 06/10/16  [name], MD  hyoscyamine (ANASPAZ) 0.125 MG TBDP disintergrating tablet Place 1 tablet (0.125 mg total) under the tongue 2 (two) times daily as  needed. Patient taking differently: Place 0.125 mg under the tongue 2 (two) times daily as needed for bladder spasms or cramping.  02/03/17   [name], MD  magic mouthwash w/lidocaine SOLN Take 5 mLs by mouth 4 (four) times daily. Patient not taking: Reported on 05/12/2017 07/26/16   [place], Holli Humbles, NP  neomycin-polymyxin-hydrocortisone (CORTISPORIN) 3.5-10000-1 ophthalmic suspension Place 2 drops into both eyes 3 (three) times daily. Patient not taking: Reported on 05/12/2017 12/25/16   [name], MD  Respiratory Therapy Supplies (NEBULIZER) DEVI Use as directed 07/22/11   [name], MD  ZOVIRAX 5 % Apply 1 application topically as needed (flair).  07/10/12   [provider]    Family History Family History  Problem Relation Age of Onset  . Cervical cancer Mother   . Heart disease Mother   . Hypertension Mother   . Cancer Mother   . Clotting disorder Mother        and aunts x 2  . Stroke Other        aunt  . Hypertension Other        aunt  . Stroke Other        uncle  . Heart attack Other        uncle  . Emphysema Other        aunt  . Emphysema Father   . Allergies Unknown        aunt and sister  . Rheum arthritis Unknown        grandmother    Social History Social History  Substance Use Topics  . Smoking status: Never Smoker  . Smokeless tobacco: Never Used     Comment: never used tobacco  . Alcohol use No     Allergies   Qvar [beclomethasone]; Spiriva [tiotropium bromide monohydrate]; Chlorzoxazone; Epinephrine; Lamotrigine; Levofloxacin; Neurontin [gabapentin]; Protriptyline hcl; Ropinirole hcl; Tizanidine; Verapamil; Zonegran; Advair diskus [fluticasone-salmeterol]; Baclofen; Chlorzoxazone; Levofloxacin; Protriptyline; and Ropinirole   Review of Systems Review of Systems  Constitutional: Negative for fever.  HENT: Negative for congestion.   Eyes: Positive for photophobia.  Respiratory: Negative for shortness of breath.    Cardiovascular: Negative for chest pain.  Gastrointestinal: Negative for abdominal pain.  Musculoskeletal: Negative for neck pain.  Skin: Negative for rash.  Neurological: Positive for speech difficulty and headaches. Negative for weakness.  Hematological: Does not bruise/bleed  easily.  Psychiatric/Behavioral: Positive for confusion.     Physical Exam Updated Vital Signs BP 133/77   Pulse 83   Temp 98.4 F (36.9 C) (Oral)   Resp (!) 29   Ht 1.702 m (5\' 7" )   Wt 89.4 kg (197 lb)   SpO2 100%   BMI 30.85 kg/m   Physical Exam  Constitutional: She appears well-developed and well-nourished. No distress.  HENT:  Head: Normocephalic and atraumatic.  Eyes: Conjunctivae and EOM are normal. Pupils are equal, round, and reactive to light.  Neck: Normal range of motion. Neck supple.  Cardiovascular: Normal rate and regular rhythm.   Pulmonary/Chest: Effort normal and breath sounds normal.  Abdominal: Soft. Bowel sounds are normal. There is no tenderness.  Musculoskeletal: Normal range of motion.  Neurological: She is alert. A cranial nerve deficit is present. No sensory deficit. She exhibits normal muscle tone. Coordination normal.  Speech difficulty but able to get most words out. No other focal neuro deficits. Patient does have photophobia.  Skin: Skin is warm.  Nursing note and vitals reviewed.    ED Treatments / Results  Labs (all labs ordered are listed, but only abnormal results are displayed) Labs Reviewed  CBC - Abnormal; Notable for the following:       Result Value   RBC 5.17 (*)    MCV 75.4 (*)    MCH 23.2 (*)    RDW 15.8 (*)    All other components within normal limits  COMPREHENSIVE METABOLIC PANEL - Abnormal; Notable for the following:    Total Bilirubin 0.2 (*)    All other components within normal limits  RAPID URINE DRUG SCREEN, HOSP PERFORMED - Abnormal; Notable for the following:    Opiates POSITIVE (*)    Benzodiazepines POSITIVE (*)    All other  components within normal limits  URINALYSIS, ROUTINE W REFLEX MICROSCOPIC - Abnormal; Notable for the following:    Color, Urine AMBER (*)    APPearance CLOUDY (*)    Leukocytes, UA LARGE (*)    Bacteria, UA MANY (*)    Squamous Epithelial / LPF 6-30 (*)    Non Squamous Epithelial 0-5 (*)    All other components within normal limits  CBG MONITORING, ED - Abnormal; Notable for the following:    Glucose-Capillary 122 (*)    All other components within normal limits  PROTIME-INR  APTT  DIFFERENTIAL  ETHANOL  I-STAT TROPOININ, ED  I-STAT CHEM 8, ED    EKG  EKG Interpretation  Date/Time:  Monday May 12 2017 14:37:44 EDT Ventricular Rate:  71 PR Interval:    QRS Duration: 94 QT Interval:  386 QTC Calculation: 420 R Axis:   -14 Text Interpretation:  Sinus rhythm Low voltage, precordial leads No previous ECGs available Confirmed by Fredia Sorrow [id number]) on 05/12/2017 5:17:27 PM       Radiology Ct Head Wo Contrast  Result Date: 05/12/2017 CLINICAL DATA:  Headache with diminished vision in left eye. Question left hand weakness EXAM: CT HEAD WITHOUT CONTRAST TECHNIQUE: Contiguous axial images were obtained from the base of the skull through the vertex without intravenous contrast. COMPARISON:  Brain MRI September 28, 2012 FINDINGS: Brain: The ventricles are normal in size and configuration. There is no evident intracranial mass, hemorrhage, extra-axial fluid collection, or midline shift. There is minimal small vessel disease in the centra semiovale bilaterally. Elsewhere gray-white compartments are normal. No acute infarct evident. Vascular: There is no hyperdense vessel. No vascular calcifications are appreciable. Skull: Bony calvarium  appears intact. Sinuses/Orbits: There is slight mucosal thickening in several ethmoid air cells. Other visualized paranasal sinuses are clear. Orbits appear symmetric bilaterally. Other: Mastoid air cells are clear. IMPRESSION: Minimal periventricular  small vessel disease. No intracranial mass, hemorrhage, or extra-axial fluid collection. No acute appearing infarct. Slight ethmoid sinus disease bilaterally. Electronically Signed   By: Lowella Grip III M.D.   On: 05/12/2017 15:44    Procedures Procedures (including critical care time)  Medications Ordered in ED Medications  0.9 %  sodium chloride infusion (not administered)  sodium chloride 0.9 % bolus 500 mL (500 mLs Intravenous New Bag/Given 05/12/17 1610)  promethazine (PHENERGAN) injection 12.5 mg (12.5 mg Intravenous Given 05/12/17 1615)  dexamethasone (DECADRON) injection 10 mg (10 mg Intravenous Given 05/12/17 1612)  diphenhydrAMINE (BENADRYL) injection 25 mg (25 mg Intravenous Given 05/12/17 1612)     Initial Impression / Assessment and Plan / ED Course  I have reviewed the triage vital signs and the nursing notes.  Pertinent labs & imaging results that were available during my care of the patient were reviewed by me and considered in my medical decision making (see chart for details).     Patient with a complicated presentation. Patient's known to have complicated migraines. Patient's also had a history of stroke in the past. Patient also has polycythemia vera. Patient followed by Dr. Elnoria Howard. Patient presented to Dr. Janee Morn office could she also has a history of bronchiectasis.     On the way there patient started to have speech problems. Sometimes this happens with her complicated migraines. Was referred here to rule out stroke.   Patient's also had a headache since yesterday. And did have some mild speech problems yesterday.  According to patient's husband hematology oncology 1 her hematocrits are above 38 usually do a bloodletting.   Patient workup here was for possible CVA. Head CT negative. MRI pending. Patient also treated for migraine with Decadron and Phenergan and Benadryl to see if this would perhaps resolve the symptoms if it does and this is related to a  complicated migraine. MRI results are still pending. Following that discussion with hematology will be required to see if she does require a bloodletting here today are whether that can be done as an outpatient tomorrow.   Final Clinical Impressions(s) / ED Diagnoses   Final diagnoses:  Aphasia  Complicated migraine  Polycythemia vera ([place])    New Prescriptions New Prescriptions   No medications on file     Fredia Sorrow, MD 05/12/17 1732

## 2017-05-12 NOTE — ED Triage Notes (Signed)
Patient has a history of stroke and cancer. Patient went to Dr. Janee Morn office today, patient was saying words that were not matching what she wanted to say, having blurred vision. Patient also having a headache.

## 2017-05-13 ENCOUNTER — Encounter: Payer: Self-pay | Admitting: Hematology & Oncology

## 2017-05-13 NOTE — Progress Notes (Signed)
Received forwarded message from [facility] regarding medication prior auth. Emailed information to [email or web address]  [email or web address].

## 2017-05-14 ENCOUNTER — Telehealth: Payer: Self-pay

## 2017-05-14 NOTE — Telephone Encounter (Signed)
Natalie Hendrix with [facility] called to confirm diagnosis code. He is working on prior [place] for promethazine.  Called back and LVM with Dx code for polycythemia vera D45. [name]'s # [phone].

## 2017-05-19 ENCOUNTER — Ambulatory Visit (HOSPITAL_BASED_OUTPATIENT_CLINIC_OR_DEPARTMENT_OTHER): Payer: Medicare Other

## 2017-05-19 ENCOUNTER — Ambulatory Visit (HOSPITAL_BASED_OUTPATIENT_CLINIC_OR_DEPARTMENT_OTHER): Payer: Medicare Other | Admitting: Family

## 2017-05-19 ENCOUNTER — Other Ambulatory Visit (HOSPITAL_BASED_OUTPATIENT_CLINIC_OR_DEPARTMENT_OTHER): Payer: Medicare Other

## 2017-05-19 VITALS — BP 111/45 | HR 77

## 2017-05-19 VITALS — BP 114/64 | HR 83 | Temp 98.9°F | Resp 20 | Wt 196.0 lb

## 2017-05-19 DIAGNOSIS — E611 Iron deficiency: Secondary | ICD-10-CM | POA: Diagnosis not present

## 2017-05-19 DIAGNOSIS — D45 Polycythemia vera: Secondary | ICD-10-CM

## 2017-05-19 LAB — CMP (CANCER CENTER ONLY)
ALBUMIN: 3.3 g/dL (ref 3.3–5.5)
ALT(SGPT): 17 U/L (ref 10–47)
AST: 33 U/L (ref 11–38)
Alkaline Phosphatase: 64 U/L (ref 26–84)
BILIRUBIN TOTAL: 0.5 mg/dL (ref 0.20–1.60)
BUN, Bld: 13 mg/dL (ref 7–22)
CALCIUM: 9 mg/dL (ref 8.0–10.3)
CHLORIDE: 110 meq/L — AB (ref 98–108)
CO2: 28 mEq/L (ref 18–33)
CREATININE: 0.6 mg/dL (ref 0.6–1.2)
Glucose, Bld: 95 mg/dL (ref 73–118)
Potassium: 4.5 mEq/L (ref 3.3–4.7)
SODIUM: 141 meq/L (ref 128–145)
TOTAL PROTEIN: 6.4 g/dL (ref 6.4–8.1)

## 2017-05-19 LAB — CBC WITH DIFFERENTIAL (CANCER CENTER ONLY)
BASO#: 0 10*3/uL (ref 0.0–0.2)
BASO%: 0.3 % (ref 0.0–2.0)
EOS ABS: 0.2 10*3/uL (ref 0.0–0.5)
EOS%: 2.6 % (ref 0.0–7.0)
HEMATOCRIT: 38.8 % (ref 34.8–46.6)
HEMOGLOBIN: 12.3 g/dL (ref 11.6–15.9)
LYMPH#: 1.7 10*3/uL (ref 0.9–3.3)
LYMPH%: 25.1 % (ref 14.0–48.0)
MCH: 23.9 pg — AB (ref 26.0–34.0)
MCHC: 31.7 g/dL — AB (ref 32.0–36.0)
MCV: 75 fL — AB (ref 81–101)
MONO#: 0.7 10*3/uL (ref 0.1–0.9)
MONO%: 9.8 % (ref 0.0–13.0)
NEUT%: 62.2 % (ref 39.6–80.0)
NEUTROS ABS: 4.3 10*3/uL (ref 1.5–6.5)
Platelets: 271 10*3/uL (ref 145–400)
RBC: 5.15 10*6/uL (ref 3.70–5.32)
RDW: 16.3 % — ABNORMAL HIGH (ref 11.1–15.7)
WBC: 6.9 10*3/uL (ref 3.9–10.0)

## 2017-05-19 LAB — IRON AND TIBC
%SAT: 10 % — AB (ref 21–57)
Iron: 49 ug/dL (ref 41–142)
TIBC: 493 ug/dL — AB (ref 236–444)
UIBC: 443 ug/dL — ABNORMAL HIGH (ref 120–384)

## 2017-05-19 LAB — FERRITIN: FERRITIN: 6 ng/mL — AB (ref 9–269)

## 2017-05-19 MED ORDER — SODIUM CHLORIDE 0.9 % IV SOLN
Freq: Once | INTRAVENOUS | Status: AC
  Administered 2017-05-19: 15:00:00 via INTRAVENOUS

## 2017-05-19 NOTE — Progress Notes (Signed)
Natalie Hendrix presents today for phlebotomy per MD orders. Phlebotomy procedure started at 1445 and ended at 42. 262 grams removed as ordered as half phlebotomy by Judson Roch NP Patient observed for 30 minutes after procedure without any incident. Patient tolerated procedure well. Diet and nutrition offered and IVF given

## 2017-05-19 NOTE — Patient Instructions (Signed)
Therapeutic Phlebotomy Therapeutic phlebotomy is the controlled removal of blood from a person's body for the purpose of treating a medical condition. The procedure is similar to donating blood. Usually, about a pint (470 mL, or 0.47L) of blood is removed. The average adult has 9-12 pints (4.3-5.7 L) of blood. Therapeutic phlebotomy may be used to treat the following medical conditions:  Hemochromatosis. This is a condition in which the blood contains too much iron.  Polycythemia vera. This is a condition in which the blood contains too many red blood cells.  Porphyria cutanea tarda. This is a disease in which an important part of hemoglobin is not made properly. It results in the buildup of abnormal amounts of porphyrins in the body.  Sickle cell disease. This is a condition in which the red blood cells form an abnormal crescent shape rather than a round shape. Tell a health care provider about:  Any allergies you have.  All medicines you are taking, including vitamins, herbs, eye drops, creams, and over-the-counter medicines.  Any problems you or family members have had with anesthetic medicines.  Any blood disorders you have.  Any surgeries you have had.  Any medical conditions you have. What are the risks? Generally, this is a safe procedure. However, problems may occur, including:  Nausea or light-headedness.  Low blood pressure.  Soreness, bleeding, swelling, or bruising at the needle insertion site.  Infection. What happens before the procedure?  Follow instructions from your health care provider about eating or drinking restrictions.  Ask your health care provider about changing or stopping your regular medicines. This is especially important if you are taking diabetes medicines or blood thinners.  Wear clothing with sleeves that can be raised above the elbow.  Plan to have someone take you home after the procedure.  You may have a blood sample taken. What happens  during the procedure?  A needle will be inserted into one of your veins.  Tubing and a collection bag will be attached to that needle.  Blood will flow through the needle and tubing into the collection bag.  You may be asked to open and close your hand slowly and continually during the entire collection.  After the specified amount of blood has been removed from your body, the collection bag and tubing will be clamped.  The needle will be removed from your vein.  Pressure will be held on the site of the needle insertion to stop the bleeding.  A bandage (dressing) will be placed over the needle insertion site. The procedure may vary among health care providers and hospitals. What happens after the procedure?  Your recovery will be assessed and monitored.  You can return to your normal activities as directed by your health care provider. This information is not intended to replace advice given to you by your health care provider. Make sure you discuss any questions you have with your health care provider. Document Released: 04/22/2011 Document Revised: 07/20/2016 Document Reviewed: 11/14/2014 Elsevier Interactive Patient Education  2017 [employer]  

## 2017-05-19 NOTE — Progress Notes (Signed)
Hematology and Oncology Follow Up Visit  Natalie Hendrix [id number] 06-Mar-1951 66 y.o. 05/19/2017   Principle Diagnosis:  Polycythemia vera- JAK2 negative  Current Therapy:   Phlebotomy to maintain hematocrit less than 38% Plavix 75 mg by mouth daily   Interim History:  Natalie Hendrix is here today with her sweet husband for follow-up. Unfortunately she was in the ED last week with what turned out to be a complex migraine. She is home now and slowly starting to feel better. She is still quite fatigued and feels "drained."  Her Hct today is 38.8. She still has a slight headache and her cheeks are ruddy.  She states that she is using her supplemental O2 a little more often this week. She denies SOB at this time.  Sh states that she has felt quite stressed lately which may have exacerbated or brought on the migraine. We are getting near the time where her son was killed in action in [place]. She sees a therapist and has been discussing this with them. This is just a hard time of year for her.   No fever, chills, n/v, cough, rash, chest pain, palpitations abdominal pain or changes in bowel or bladder habits.  No swelling, tenderness, numbness or tingling in her extremities at this time.  She has maintained a good appetite and is staying well hydrated. Her weight is stable.   ECOG Performance Status: 1 - Symptomatic but completely ambulatory  Medications:  Allergies as of 05/19/2017      Reactions   Qvar [beclomethasone]    Spiriva [tiotropium Bromide Monohydrate]    rash   Chlorzoxazone    Epinephrine    Raises heart rate   Lamotrigine    Levofloxacin    Neurontin [gabapentin]    arthralgia   Protriptyline Hcl    Severe constipation   Ropinirole Hcl    arthralgia   Tizanidine    panic   Verapamil    Zonegran    Mood swings   Advair Diskus [fluticasone-salmeterol] Rash   Baclofen Rash   Chlorzoxazone Rash   Levofloxacin Rash   Protriptyline Rash   Ropinirole Rash        Medication List       Accurate as of 05/19/17  1:52 PM. Always use your most recent med list.          albuterol 108 (90 Base) MCG/ACT inhaler Commonly known as:  PROAIR HFA Inhale 2 puffs into the lungs every 6 (six) hours as needed for wheezing or shortness of breath.   albuterol (2.5 MG/3ML) 0.083% nebulizer solution Commonly known as:  PROVENTIL Take 3 mLs (2.5 mg total) by nebulization every 6 (six) hours as needed for wheezing or shortness of breath.   azelastine 0.1 % nasal spray Commonly known as:  ASTELIN Place 1 spray into both nostrils 2 (two) times daily.   beclomethasone 42 MCG/SPRAY nasal spray Commonly known as:  BECONASE-AQ Place 2 sprays into both nostrils 2 (two) times daily. Dose is for each nostril.   benzonatate 100 MG capsule Commonly known as:  TESSALON Take 1 capsule (100 mg total) by mouth every 6 (six) hours as needed for cough.   buPROPion 300 MG 24 hr tablet Commonly known as:  WELLBUTRIN XL Take 1 tablet (300 mg total) by mouth daily with breakfast.   CALCIUM 1200 PO Take 2,400 mg by mouth daily.   CENTRUM SILVER tablet Take 1 tablet by mouth daily.   clopidogrel 75 MG tablet Commonly known as:  PLAVIX Take 1 tablet (75 mg total) by mouth daily.   co-enzyme Q-10 30 MG capsule Take 30 mg by mouth daily.   cyclobenzaprine 5 MG tablet Commonly known as:  FLEXERIL Take 5 mg by mouth 2 (two) times daily as needed for muscle spasms. Phillips   ESTRACE VAGINAL 0.1 MG/GM vaginal cream Generic drug:  estradiol Place 1 Applicatorful vaginally daily.   ezetimibe 10 MG tablet Commonly known as:  ZETIA TAKE 1 TABLET BY MOUTH DAILY.   fexofenadine 180 MG tablet Commonly known as:  ALLEGRA Take 1 tablet (180 mg total) by mouth daily as needed for allergies. As needed   Fish Oil 1200 MG Caps Take 1,200 mg by mouth at bedtime.   hyoscyamine 0.125 MG Tbdp disintergrating tablet Commonly known as:  ANASPAZ Place 1 tablet (0.125 mg total)  under the tongue 2 (two) times daily as needed.   lidocaine 5 % Commonly known as:  LIDODERM Place 1 patch onto the skin as needed (pain). Remove & Discard patch within 12 hours or as directed by MD   LORazepam 1 MG tablet Commonly known as:  ATIVAN Take 1 tablet (1 mg total) by mouth 2 (two) times daily. May take extra 3rd dose if needed   magic mouthwash w/lidocaine Soln Take 5 mLs by mouth 4 (four) times daily.   magnesium gluconate 500 MG tablet Commonly known as:  MAGONATE Take 500 mg by mouth daily as needed (constipation).   meclizine 25 MG tablet Commonly known as:  ANTIVERT Take 1 tablet (25 mg total) by mouth 3 (three) times daily as needed for dizziness. 3 month supply   metoprolol succinate 50 MG 24 hr tablet Commonly known as:  TOPROL-XL Take 1 tablet (50 mg total) by mouth daily with breakfast.   morphine 15 MG tablet Commonly known as:  MSIR Take 15 mg by mouth every 4 (four) hours as needed for severe pain.   morphine 30 MG 12 hr tablet Commonly known as:  MS CONTIN Take 30 mg by mouth every 12 (twelve) hours.   Nebulizer Devi Use as directed   neomycin-polymyxin-hydrocortisone 3.5-10000-1 ophthalmic suspension Commonly known as:  CORTISPORIN Place 2 drops into both eyes 3 (three) times daily.   OXYGEN Inhale 2.5 L/hr into the lungs continuous.   pantoprazole 40 MG tablet Commonly known as:  PROTONIX TAKE 1 TABLET (40 MG TOTAL) BY MOUTH DAILY.   pravastatin 40 MG tablet Commonly known as:  PRAVACHOL Take 1 tablet (40 mg total) by mouth at bedtime.   promethazine 25 MG tablet Commonly known as:  PHENERGAN Take 25 mg by mouth every 8 (eight) hours as needed for nausea or vomiting.   SYSTANE 0.4-0.3 % Soln Generic drug:  Polyethyl Glycol-Propyl Glycol Apply 1 drop to eye daily as needed (dry eyes).   TART CHERRY ADVANCED PO Take 5 mLs by mouth daily. Juice Concentrate- 1tsp daily   ZOVIRAX 5 % Generic drug:  acyclovir ointment Apply 1  application topically as needed (flair).       Allergies:  Allergies  Allergen Reactions  . Qvar [Beclomethasone]   . Spiriva [Tiotropium Bromide Monohydrate]     rash  . Chlorzoxazone   . Epinephrine     Raises heart rate  . Lamotrigine   . Levofloxacin   . Neurontin [Gabapentin]     arthralgia  . Protriptyline Hcl     Severe constipation  . Ropinirole Hcl     arthralgia  . Tizanidine     panic  .  Verapamil   . Zonegran     Mood swings  . Advair Diskus [Fluticasone-Salmeterol] Rash  . Baclofen Rash  . Chlorzoxazone Rash  . Levofloxacin Rash  . Protriptyline Rash  . Ropinirole Rash    Past Medical History, Surgical history, Social history, and Family History were reviewed and updated.  Review of Systems: All other 10 point review of systems is negative.   Physical Exam:  vitals were not taken for this visit.  Wt Readings from Last 3 Encounters:  05/12/17 197 lb (89.4 kg)  03/31/17 194 lb 12.8 oz (88.4 kg)  02/17/17 197 lb 12.8 oz (89.7 kg)    Ocular: Sclerae unicteric, pupils equal, round and reactive to light Ear-nose-throat: Oropharynx clear, dentition fair Lymphatic: No cervical, supraclavicular or axillary adenopathy Lungs no rales or rhonchi, good excursion bilaterally Heart regular rate and rhythm, no murmur appreciated Abd soft, nontender, positive bowel sounds, no liver or spleen tip palpated on exam, no fluid wave  MSK no focal spinal tenderness, no joint edema Neuro: non-focal, well-oriented, appropriate affect Breasts: Deferred   Lab Results  Component Value Date   WBC 6.9 05/19/2017   HGB 12.3 05/19/2017   HCT 38.8 05/19/2017   MCV 75 (L) 05/19/2017   PLT 271 05/19/2017   Lab Results  Component Value Date   FERRITIN <4 (L) 03/31/2017   IRON 47 03/31/2017   TIBC 473 (H) 03/31/2017   UIBC 425 (H) 03/31/2017   IRONPCTSAT 10 (L) 03/31/2017   Lab Results  Component Value Date   RETICCTPCT 1.1 04/27/2015   RBC 5.15 05/19/2017    RETICCTABS 55.1 04/27/2015   No results found for: KPAFRELGTCHN, LAMBDASER, KAPLAMBRATIO No results found for: IGGSERUM, IGA, IGMSERUM No results found for: Odetta Pink, SPEI   Chemistry      Component Value Date/Time   NA 140 05/12/2017 1450   NA 141 03/31/2017 1101   NA 141 02/17/2017 1132   K 4.2 05/12/2017 1450   K 4.2 03/31/2017 1101   K 5.3 (H) 02/17/2017 1132   CL 102 05/12/2017 1450   CL 103 03/31/2017 1101   CO2 27 05/12/2017 1439   CO2 30 03/31/2017 1101   CO2 29 02/17/2017 1132   BUN 17 05/12/2017 1450   BUN 16 03/31/2017 1101   BUN 17.8 02/17/2017 1132   CREATININE 0.90 05/12/2017 1450   CREATININE 0.9 03/31/2017 1101   CREATININE 0.8 02/17/2017 1132      Component Value Date/Time   CALCIUM 9.1 05/12/2017 1439   CALCIUM 9.1 03/31/2017 1101   CALCIUM 9.3 02/17/2017 1132   ALKPHOS 66 05/12/2017 1439   ALKPHOS 73 03/31/2017 1101   ALKPHOS 79 02/17/2017 1132   AST 26 05/12/2017 1439   AST 27 03/31/2017 1101   AST 22 02/17/2017 1132   ALT 23 05/12/2017 1439   ALT 26 03/31/2017 1101   ALT 20 02/17/2017 1132   BILITOT 0.2 (L) 05/12/2017 1439   BILITOT 0.60 03/31/2017 1101   BILITOT 0.39 02/17/2017 1132      Impression and Plan: Natalie Hendrix is a very pleasant 66 yo caucasian female with polycythemia. Unfortunately she had a complex migraine last week and had to be treated in the ED. She was able to go home and is starting to feel a little better. She is still symptomatic with a mild headache and ruddy cheeks. She feels quite fatigued and is resting as needed at home.  Hct today is 38.8 so we will proceed  with a 1/2 phlebotomy (250 ml's removed) and then give her 500 ml of IV fluid after. She is in agreement with the plan.  We will plan to see her back in 1 month for repeat lab work and follow-up.  Both she and her husband know to contact our office with any questions or concerns. We can certainly see her sooner  if need be.   Eliezer Bottom, NP 6/18/20181:52 PM

## 2017-06-09 ENCOUNTER — Telehealth: Payer: Self-pay | Admitting: Internal Medicine

## 2017-06-09 MED ORDER — DOXYCYCLINE HYCLATE 100 MG PO TABS: ORAL_TABLET | ORAL | 0 refills | Status: DC

## 2017-06-09 NOTE — Telephone Encounter (Signed)
CY wrote on print out phone note his recommendations which are: Doxycyline 100mg  #8 Take 2 today then 1 daily. Spoke with pt and pt's husband. They both agreed to the medication being called in and verified which pharmacy they needed these sent to. Rx was sent to pt's pharmacy of choice. They had no further questions at this time. Nothing further is needed

## 2017-06-09 NOTE — Telephone Encounter (Signed)
Pt states she was exposed to pollen and dust on Saturday. Pt has developed prod cough with yellow to green mucus, wheezing, chest tightness, nasal drainage yellow in color and post nasal drip x1d. pt currently on 2L-O2 level have been between 93-96% on 2L at rest. Pt has albuterol neb treatment 1.5hr ago with mild improvement.   CY please advise. Thanks.   Current Outpatient Prescriptions on File Prior to Visit  Medication Sig Dispense Refill  . albuterol (PROAIR HFA) 108 (90 Base) MCG/ACT inhaler Inhale 2 puffs into the lungs every 6 (six) hours as needed for wheezing or shortness of breath. 3 Inhaler 3  . albuterol (PROVENTIL) (2.5 MG/3ML) 0.083% nebulizer solution Take 3 mLs (2.5 mg total) by nebulization every 6 (six) hours as needed for wheezing or shortness of breath. 1080 mL 1  . azelastine (ASTELIN) 0.1 % nasal spray Place 1 spray into both nostrils 2 (two) times daily. 90 mL 3  . beclomethasone (BECONASE-AQ) 42 MCG/SPRAY nasal spray Place 2 sprays into both nostrils 2 (two) times daily. Dose is for each nostril. 75 g 3  . benzonatate (TESSALON) 100 MG capsule Take 1 capsule (100 mg total) by mouth every 6 (six) hours as needed for cough. 360 capsule 3  . buPROPion (WELLBUTRIN XL) 300 MG 24 hr tablet Take 1 tablet (300 mg total) by mouth daily with breakfast. 90 tablet 3  . Calcium Carbonate-Vit D-Min (CALCIUM 1200 PO) Take 2,400 mg by mouth daily.     . clopidogrel (PLAVIX) 75 MG tablet Take 1 tablet (75 mg total) by mouth daily. 90 tablet 3  . co-enzyme Q-10 30 MG capsule Take 30 mg by mouth daily.     . cyclobenzaprine (FLEXERIL) 5 MG tablet Take 5 mg by mouth 2 (two) times daily as needed for muscle spasms. Phillips  0  . ESTRACE VAGINAL 0.1 MG/GM vaginal cream Place 1 Applicatorful vaginally daily.     Marland Kitchen ezetimibe (ZETIA) 10 MG tablet TAKE 1 TABLET BY MOUTH DAILY. 90 tablet 3  . fexofenadine (ALLEGRA) 180 MG tablet Take 1 tablet (180 mg total) by mouth daily as needed for allergies.  As needed 90 tablet 3  . hyoscyamine (ANASPAZ) 0.125 MG TBDP disintergrating tablet Place 1 tablet (0.125 mg total) under the tongue 2 (two) times daily as needed. (Patient taking differently: Place 0.125 mg under the tongue 2 (two) times daily as needed for bladder spasms or cramping. ) 180 tablet 0  . lidocaine (LIDODERM) 5 % Place 1 patch onto the skin as needed (pain). Remove & Discard patch within 12 hours or as directed by MD    . LORazepam (ATIVAN) 1 MG tablet Take 1 tablet (1 mg total) by mouth 2 (two) times daily. May take extra 3rd dose if needed 75 tablet 0  . magic mouthwash w/lidocaine SOLN Take 5 mLs by mouth 4 (four) times daily. (Patient not taking: Reported on 05/12/2017) 240 mL 3  . magnesium gluconate (MAGONATE) 500 MG tablet Take 500 mg by mouth daily as needed (constipation).     . meclizine (ANTIVERT) 25 MG tablet Take 1 tablet (25 mg total) by mouth 3 (three) times daily as needed for dizziness. 3 month supply (Patient taking differently: Take 25 mg by mouth 3 (three) times daily. 3 month supply) 60 tablet 0  . metoprolol succinate (TOPROL-XL) 50 MG 24 hr tablet Take 1 tablet (50 mg total) by mouth daily with breakfast. 90 tablet 1  . Misc Natural Products (TART CHERRY ADVANCED PO) Take  5 mLs by mouth daily. Juice Concentrate- 1tsp daily    . morphine (MS CONTIN) 30 MG 12 hr tablet Take 30 mg by mouth every 12 (twelve) hours.   0  . morphine (MSIR) 15 MG tablet Take 15 mg by mouth every 4 (four) hours as needed for severe pain.    . Multiple Vitamins-Minerals (CENTRUM SILVER) tablet Take 1 tablet by mouth daily.    Marland Kitchen neomycin-polymyxin-hydrocortisone (CORTISPORIN) 3.5-10000-1 ophthalmic suspension Place 2 drops into both eyes 3 (three) times daily. (Patient not taking: Reported on 05/12/2017) 7.5 mL 0  . Omega-3 Fatty Acids (FISH OIL) 1200 MG CAPS Take 1,200 mg by mouth at bedtime.     . OXYGEN Inhale 2.5 L/hr into the lungs continuous.    . pantoprazole (PROTONIX) 40 MG tablet  TAKE 1 TABLET (40 MG TOTAL) BY MOUTH DAILY. 90 tablet 3  . Polyethyl Glycol-Propyl Glycol (SYSTANE) 0.4-0.3 % SOLN Apply 1 drop to eye daily as needed (dry eyes).     . pravastatin (PRAVACHOL) 40 MG tablet Take 1 tablet (40 mg total) by mouth at bedtime. 90 tablet 3  . promethazine (PHENERGAN) 25 MG tablet Take 25 mg by mouth every 8 (eight) hours as needed for nausea or vomiting.   1  . Respiratory Therapy Supplies (NEBULIZER) DEVI Use as directed 1 each 0  . ZOVIRAX 5 % Apply 1 application topically as needed (flair).      No current facility-administered medications on file prior to visit.     Allergies  Allergen Reactions  . Qvar [Beclomethasone]   . Spiriva [Tiotropium Bromide Monohydrate]     rash  . Chlorzoxazone   . Epinephrine     Raises heart rate  . Lamotrigine   . Levofloxacin   . Neurontin [Gabapentin]     arthralgia  . Protriptyline Hcl     Severe constipation  . Ropinirole Hcl     arthralgia  . Tizanidine     panic  . Verapamil   . Zonegran     Mood swings  . Advair Diskus [Fluticasone-Salmeterol] Rash  . Baclofen Rash  . Chlorzoxazone Rash  . Levofloxacin Rash  . Protriptyline Rash  . Ropinirole Rash

## 2017-06-12 ENCOUNTER — Ambulatory Visit: Payer: Medicare Other | Admitting: Internal Medicine

## 2017-06-12 ENCOUNTER — Telehealth: Payer: Self-pay | Admitting: Internal Medicine

## 2017-06-12 NOTE — Telephone Encounter (Signed)
Spoke with pt's husband and informed him of KW's message. He agreed to the follow up appt for both him and his wife. Both appts were made and he had no further questions at this time. Nothing further is needed

## 2017-06-12 NOTE — Telephone Encounter (Signed)
Yes

## 2017-06-12 NOTE — Telephone Encounter (Signed)
Just to clarify Joellen Jersey we are booking both patients in that held spot correct?

## 2017-06-12 NOTE — Telephone Encounter (Signed)
Spoke with the pts husband and he stated that he woke up sick this morning and they were not able to keep their appts,.  She will need an appt before October.  Katie please advise on appts for her and her husband,.

## 2017-06-12 NOTE — Telephone Encounter (Signed)
Please have patient come in on Friday 07-11-17 at 11:30am slot. Please let her know this is a work in slot and might be a slightly longer wait time to be seen. Thanks.

## 2017-06-16 ENCOUNTER — Other Ambulatory Visit: Payer: Medicare Other

## 2017-06-16 ENCOUNTER — Ambulatory Visit: Payer: Medicare Other

## 2017-06-16 ENCOUNTER — Ambulatory Visit: Payer: Medicare Other | Admitting: Family

## 2017-06-16 ENCOUNTER — Other Ambulatory Visit (HOSPITAL_BASED_OUTPATIENT_CLINIC_OR_DEPARTMENT_OTHER): Payer: Medicare Other

## 2017-06-16 ENCOUNTER — Ambulatory Visit (HOSPITAL_BASED_OUTPATIENT_CLINIC_OR_DEPARTMENT_OTHER): Payer: Medicare Other | Admitting: Family

## 2017-06-16 VITALS — BP 114/55 | HR 70 | Resp 18

## 2017-06-16 VITALS — BP 141/57 | HR 73 | Temp 98.6°F | Resp 19 | Wt 198.0 lb

## 2017-06-16 DIAGNOSIS — D45 Polycythemia vera: Secondary | ICD-10-CM

## 2017-06-16 LAB — CMP (CANCER CENTER ONLY)
ALT(SGPT): 30 U/L (ref 10–47)
AST: 28 U/L (ref 11–38)
Albumin: 3.4 g/dL (ref 3.3–5.5)
Alkaline Phosphatase: 66 U/L (ref 26–84)
BUN: 12 mg/dL (ref 7–22)
CHLORIDE: 105 meq/L (ref 98–108)
CO2: 30 meq/L (ref 18–33)
CREATININE: 0.8 mg/dL (ref 0.6–1.2)
Calcium: 9.3 mg/dL (ref 8.0–10.3)
Glucose, Bld: 97 mg/dL (ref 73–118)
Potassium: 5 mEq/L — ABNORMAL HIGH (ref 3.3–4.7)
SODIUM: 140 meq/L (ref 128–145)
Total Bilirubin: 0.6 mg/dl (ref 0.20–1.60)
Total Protein: 6.5 g/dL (ref 6.4–8.1)

## 2017-06-16 LAB — CBC WITH DIFFERENTIAL (CANCER CENTER ONLY)
BASO#: 0 10*3/uL (ref 0.0–0.2)
BASO%: 0.3 % (ref 0.0–2.0)
EOS ABS: 0.1 10*3/uL (ref 0.0–0.5)
EOS%: 2.2 % (ref 0.0–7.0)
HCT: 39.4 % (ref 34.8–46.6)
HGB: 12.3 g/dL (ref 11.6–15.9)
LYMPH#: 1.5 10*3/uL (ref 0.9–3.3)
LYMPH%: 23 % (ref 14.0–48.0)
MCH: 23.8 pg — AB (ref 26.0–34.0)
MCHC: 31.2 g/dL — ABNORMAL LOW (ref 32.0–36.0)
MCV: 76 fL — AB (ref 81–101)
MONO#: 0.7 10*3/uL (ref 0.1–0.9)
MONO%: 10.5 % (ref 0.0–13.0)
NEUT#: 4.1 10*3/uL (ref 1.5–6.5)
NEUT%: 64 % (ref 39.6–80.0)
PLATELETS: 260 10*3/uL (ref 145–400)
RBC: 5.17 10*6/uL (ref 3.70–5.32)
RDW: 16.7 % — AB (ref 11.1–15.7)
WBC: 6.5 10*3/uL (ref 3.9–10.0)

## 2017-06-16 MED ORDER — SODIUM CHLORIDE 0.9 % IV SOLN: 1000.0000 mL | Freq: Once | INTRAVENOUS | Status: AC

## 2017-06-16 NOTE — Progress Notes (Signed)
Hematology and Oncology Follow Up Visit  MARY-ANNE POLIZZI [id number] 1951-03-03 66 y.o. 06/16/2017   Principle Diagnosis:  Polycythemia vera- JAK2 negative  Current Therapy:   Phlebotomy to maintain hematocrit less than 38% Plavix 75 mg by mouth daily    Interim History:  Ms. Norlander is here today with her husband for follow-up. This week has been hard for her. Last week was they anniversary of her son's death and today was the anniversary of his burial. He was killed in action in [place], years ago. She states that she has noticed an increase in her anxiety and depression. She is hoping to see Dr. Tomi Likens and request to transfer her care to Dr. Phillips Odor. She would prefer to see a female and receiving some grief counseling if possible.  She keeps a tension migraine and states that she has occasional palpitations. Her complexion today is ruddy in her cheeks. Hct is up at 39.4.  She did trip and fall while clearing off her porch. Thankfully she was not seriously injured. She has had no episodes of bleeding, bruising or petechiae. No lymphadenopathy found on exam.  She is on supplemental O2 as needed and states that her SOB is unchanged. She has the occasional flare due to asthma and allergies.  She has episodes of dizziness due to vertigo and takes Meclizine as needed.  No fever, chills, n/v, cough, rash, chest pain, abdominal pain or changes in bowel or bladder habits.  No swelling, tenderness, numbness or tingling in her extremities.  Her appetite comes and goes due to the heat but she is making sure to stay well hydrated. Her weight is stable.   ECOG Performance Status: 2 - Symptomatic, <50% confined to bed  Medications:  Allergies as of 06/16/2017      Reactions   Qvar [beclomethasone]    Spiriva [tiotropium Bromide Monohydrate]    rash   Chlorzoxazone    Epinephrine    Raises heart rate   Lamotrigine    Levofloxacin    Neurontin [gabapentin]    arthralgia   Protriptyline Hcl    Severe  constipation   Ropinirole Hcl    arthralgia   Tizanidine    panic   Verapamil    Zonegran    Mood swings   Advair Diskus [fluticasone-salmeterol] Rash   Baclofen Rash   Chlorzoxazone Rash   Levofloxacin Rash   Protriptyline Rash   Ropinirole Rash      Medication List       Accurate as of 06/16/17 12:49 PM. Always use your most recent med list.          albuterol 108 (90 Base) MCG/ACT inhaler Commonly known as:  PROAIR HFA Inhale 2 puffs into the lungs every 6 (six) hours as needed for wheezing or shortness of breath.   albuterol (2.5 MG/3ML) 0.083% nebulizer solution Commonly known as:  PROVENTIL Take 3 mLs (2.5 mg total) by nebulization every 6 (six) hours as needed for wheezing or shortness of breath.   azelastine 0.1 % nasal spray Commonly known as:  ASTELIN Place 1 spray into both nostrils 2 (two) times daily.   beclomethasone 42 MCG/SPRAY nasal spray Commonly known as:  BECONASE-AQ Place 2 sprays into both nostrils 2 (two) times daily. Dose is for each nostril.   benzonatate 100 MG capsule Commonly known as:  TESSALON Take 1 capsule (100 mg total) by mouth every 6 (six) hours as needed for cough.   buPROPion 300 MG 24 hr tablet Commonly known as:  WELLBUTRIN XL Take 1 tablet (300 mg total) by mouth daily with breakfast.   CALCIUM 1200 PO Take 2,400 mg by mouth daily.   CENTRUM SILVER tablet Take 1 tablet by mouth daily.   clopidogrel 75 MG tablet Commonly known as:  PLAVIX Take 1 tablet (75 mg total) by mouth daily.   co-enzyme Q-10 30 MG capsule Take 30 mg by mouth daily.   cyclobenzaprine 5 MG tablet Commonly known as:  FLEXERIL Take 5 mg by mouth 2 (two) times daily as needed for muscle spasms. Phillips   doxycycline 100 MG tablet Commonly known as:  VIBRA-TABS Take 2 tabs today then 1 daily   ESTRACE VAGINAL 0.1 MG/GM vaginal cream Generic drug:  estradiol Place 1 Applicatorful vaginally daily.   ezetimibe 10 MG tablet Commonly known  as:  ZETIA TAKE 1 TABLET BY MOUTH DAILY.   fexofenadine 180 MG tablet Commonly known as:  ALLEGRA Take 1 tablet (180 mg total) by mouth daily as needed for allergies. As needed   Fish Oil 1200 MG Caps Take 1,200 mg by mouth at bedtime.   hyoscyamine 0.125 MG Tbdp disintergrating tablet Commonly known as:  ANASPAZ Place 1 tablet (0.125 mg total) under the tongue 2 (two) times daily as needed.   lidocaine 5 % Commonly known as:  LIDODERM Place 1 patch onto the skin as needed (pain). Remove & Discard patch within 12 hours or as directed by MD   LORazepam 1 MG tablet Commonly known as:  ATIVAN Take 1 tablet (1 mg total) by mouth 2 (two) times daily. May take extra 3rd dose if needed   magic mouthwash w/lidocaine Soln Take 5 mLs by mouth 4 (four) times daily.   magnesium gluconate 500 MG tablet Commonly known as:  MAGONATE Take 500 mg by mouth daily as needed (constipation).   meclizine 25 MG tablet Commonly known as:  ANTIVERT Take 1 tablet (25 mg total) by mouth 3 (three) times daily as needed for dizziness. 3 month supply   metoprolol succinate 50 MG 24 hr tablet Commonly known as:  TOPROL-XL Take 1 tablet (50 mg total) by mouth daily with breakfast.   morphine 15 MG tablet Commonly known as:  MSIR Take 15 mg by mouth every 4 (four) hours as needed for severe pain.   morphine 30 MG 12 hr tablet Commonly known as:  MS CONTIN Take 30 mg by mouth every 12 (twelve) hours.   Nebulizer Devi Use as directed   neomycin-polymyxin-hydrocortisone 3.5-10000-1 ophthalmic suspension Commonly known as:  CORTISPORIN Place 2 drops into both eyes 3 (three) times daily.   OXYGEN Inhale 2.5 L/hr into the lungs continuous.   pantoprazole 40 MG tablet Commonly known as:  PROTONIX TAKE 1 TABLET (40 MG TOTAL) BY MOUTH DAILY.   pravastatin 40 MG tablet Commonly known as:  PRAVACHOL Take 1 tablet (40 mg total) by mouth at bedtime.   promethazine 25 MG tablet Commonly known as:   PHENERGAN Take 25 mg by mouth every 8 (eight) hours as needed for nausea or vomiting.   SYSTANE 0.4-0.3 % Soln Generic drug:  Polyethyl Glycol-Propyl Glycol Apply 1 drop to eye daily as needed (dry eyes).   TART CHERRY ADVANCED PO Take 5 mLs by mouth daily. Juice Concentrate- 1tsp daily   ZOVIRAX 5 % Generic drug:  acyclovir ointment Apply 1 application topically as needed (flair).       Allergies:  Allergies  Allergen Reactions  . Qvar [Beclomethasone]   . Spiriva [Tiotropium Bromide Monohydrate]  rash  . Chlorzoxazone   . Epinephrine     Raises heart rate  . Lamotrigine   . Levofloxacin   . Neurontin [Gabapentin]     arthralgia  . Protriptyline Hcl     Severe constipation  . Ropinirole Hcl     arthralgia  . Tizanidine     panic  . Verapamil   . Zonegran     Mood swings  . Advair Diskus [Fluticasone-Salmeterol] Rash  . Baclofen Rash  . Chlorzoxazone Rash  . Levofloxacin Rash  . Protriptyline Rash  . Ropinirole Rash    Past Medical History, Surgical history, Social history, and Family History were reviewed and updated.  Review of Systems: All other 10 point review of systems is negative.   Physical Exam:  vitals were not taken for this visit.  Wt Readings from Last 3 Encounters:  05/19/17 196 lb (88.9 kg)  05/12/17 197 lb (89.4 kg)  03/31/17 194 lb 12.8 oz (88.4 kg)    Ocular: Sclerae unicteric, pupils equal, round and reactive to light Ear-nose-throat: Oropharynx clear, dentition fair Lymphatic: No cervical, supraclavicular or axillary adenopathy Lungs no rales or rhonchi, good excursion bilaterally Heart regular rate and rhythm, no murmur appreciated Abd soft, nontender, positive bowel sounds, no liver or spleen tip palpated on exam, no fluid wave  MSK no focal spinal tenderness, no joint edema Neuro: non-focal, well-oriented, appropriate affect Breasts: Deferred   Lab Results  Component Value Date   WBC 6.5 06/16/2017   HGB 12.3  06/16/2017   HCT 39.4 06/16/2017   MCV 76 (L) 06/16/2017   PLT 260 06/16/2017   Lab Results  Component Value Date   FERRITIN 6 (L) 05/19/2017   IRON 49 05/19/2017   TIBC 493 (H) 05/19/2017   UIBC 443 (H) 05/19/2017   IRONPCTSAT 10 (L) 05/19/2017   Lab Results  Component Value Date   RETICCTPCT 1.1 04/27/2015   RBC 5.17 06/16/2017   RETICCTABS 55.1 04/27/2015   No results found for: KPAFRELGTCHN, LAMBDASER, KAPLAMBRATIO No results found for: IGGSERUM, IGA, IGMSERUM No results found for: Odetta Pink, SPEI   Chemistry      Component Value Date/Time   NA 140 06/16/2017 1208   NA 141 02/17/2017 1132   K 5.0 (H) 06/16/2017 1208   K 5.3 (H) 02/17/2017 1132   CL 105 06/16/2017 1208   CO2 30 06/16/2017 1208   CO2 29 02/17/2017 1132   BUN 12 06/16/2017 1208   BUN 17.8 02/17/2017 1132   CREATININE 0.8 06/16/2017 1208   CREATININE 0.8 02/17/2017 1132      Component Value Date/Time   CALCIUM 9.3 06/16/2017 1208   CALCIUM 9.3 02/17/2017 1132   ALKPHOS 66 06/16/2017 1208   ALKPHOS 79 02/17/2017 1132   AST 28 06/16/2017 1208   AST 22 02/17/2017 1132   ALT 30 06/16/2017 1208   ALT 20 02/17/2017 1132   BILITOT 0.60 06/16/2017 1208   BILITOT 0.39 02/17/2017 1132      Impression and Plan: Ms. Kothari is a very pleasant 66 yo caucasian female with polycythemia. She is feeling a little fatigued, keeping a stress migraine and having occasional palpitations. This is the day she had to bury her only son and is extremely hard for her. She is having increased anxiety and depression at this time and is trying to get in with Dr. Lorelle Gibbs for evaluation and grief counseling.  Hct today is 39.4 so we will proceed with phlebotomy followed by  replacement fluids. She is in agreement with the plan.  We will go ahead and plan to see her back again in 4 weeks for repeat lab work and follow-up.  Greater than 50% of her 25 minute visit was spent  counseling and coordinating care.  Both she and her sweet husband know to contact our office with any questions or concerns. We can certainly see her sooner if need be.   Eliezer Bottom, NP 7/16/201812:49 PM

## 2017-06-17 LAB — IRON AND TIBC
%SAT: 9 % — AB (ref 21–57)
Iron: 41 ug/dL (ref 41–142)
TIBC: 476 ug/dL — ABNORMAL HIGH (ref 236–444)
UIBC: 435 ug/dL — AB (ref 120–384)

## 2017-06-17 LAB — FERRITIN: Ferritin: 4 ng/ml — ABNORMAL LOW (ref 9–269)

## 2017-07-11 ENCOUNTER — Encounter: Payer: Self-pay | Admitting: Internal Medicine

## 2017-07-11 ENCOUNTER — Ambulatory Visit (INDEPENDENT_AMBULATORY_CARE_PROVIDER_SITE_OTHER): Payer: Medicare Other | Admitting: Internal Medicine

## 2017-07-11 DIAGNOSIS — J302 Other seasonal allergic rhinitis: Secondary | ICD-10-CM

## 2017-07-11 DIAGNOSIS — J3089 Other allergic rhinitis: Secondary | ICD-10-CM | POA: Diagnosis not present

## 2017-07-11 DIAGNOSIS — J9611 Chronic respiratory failure with hypoxia: Secondary | ICD-10-CM

## 2017-07-11 DIAGNOSIS — D45 Polycythemia vera: Secondary | ICD-10-CM

## 2017-07-11 NOTE — Assessment & Plan Note (Signed)
Periodic phlebotomies managed by hematology/ Dr Marin Olp

## 2017-07-11 NOTE — Progress Notes (Signed)
HPI female never smoker followed for asthma, history pneumonia, cough, complicated by past history for thoracic outlet syndrome, chronic hypoxic respiratory failure, complicated by CVA, GERD, allergic rhinitis, polycythemia, depression O2 2 L sleep and prn/Advanced  Nl PFT 2012 except DLCO 62% Allergy profile was NEG, 4.9 total IgE GI/ Dr Paulita Fujita: Ba swallow> stricture and probably mild aspiration Residual right diaphragm elevation after surgery for right thoracic outlet syndrome --------------------------------------------------------------------------------  07/11/17- 66 year old female never smoker followed for asthma, history pneumonia, cough, chronic hypoxic respiratory failure,  past history for thoracic outlet syndrome, chronic elevation right diaphragm,complicated by CVA, GERD, allergic rhinitis, polycythemia, depression O2 2 L sleep and when necessary/Advanced  FOLLOWS FOR: Pt had TIA 05-25-17 and continues to have issues with vertigo when moving head; migraines, speech issues as well. Continues to use O2.  Daily productive cough is now just producing some clear sputum after recent antibiotic cleared purulent. No routine fever but says she has been told her polycythemia might cause temperature changes. Getting repeated phlebotomies. Blames "allergy" for throat tickle cough when she is outside. At home in air conditioning she denies cough or breathing problems. Using nebulizer machine/albuterol about twice daily, occasional rescue inhaler. Continues azelastine and Beconase nasal sprays. O2 2 L portable concentrator. Would like new small portable concentrator when available. CXR 01/08/17 IMPRESSION: Stable exam.  No evidence of acute disease.  Review of Systems- See HPI   + = pos Constitutional:   No-   weight loss, night sweats, fevers, chills, fatigue, lassitude. HEENT:   frequent  headaches,  Some difficulty swallowing,, sore throat,       No-  sneezing, itching, ear ache, nasal  congestion, post nasal drip,  CV:  No-   chest pain, orthopnea, PND, swelling in lower extremities, anasarca, dizziness, palpitations Resp: + shortness of breath with exertion or at rest. productive cough              No-  coughing up of blood.              No-  change in color of mucus.   wheezing.   Skin: No-   rash or lesions. GI:  No-   heartburn, indigestion, abdominal pain, nausea, vomiting, GU:  MS:  No-   joint pain or swelling.   Neuro- + tremor and poor balance Psych:  No- change in mood or affect. No depression or anxiety.  No memory loss.    Objective:   Physical Exam  General- Alert, Oriented, Affect+ tearful, Distress- none acute,  Overweight.  + Rolling walker              Brought portable O2 but on room air,  Skin- rash-none, lesions- none, excoriation- none Lymphadenopathy- none Head- atraumatic            Eyes- Gross vision intact, PERRLA, conjunctivae clear secretions            Ears- Hearing, canals            Nose- Clear, No- Septal dev, mucus, polyps, erosion, perforation             Throat- Mallampati III , mucosa clear , drainage- none, tonsils- atrophic, + throat clearing Neck- flexible , trachea midline, no stridor , thyroid nl, carotid no bruit Chest - symmetrical excursion , unlabored           Heart/CV- RRR , no murmur , no gallop  , no rub, nl s1 s2                           -  JVD- none , edema- none, stasis changes- none, varices- none           Lung- clear to P&A, wheeze-none, cough-none,                                                    dullness-none, rub- none, unlabored           Chest wall- +Vascular surgery scar Right Upper Anterior chest Abd- Br/ Gen/ Rectal- Not done, not indicated Extrem- cyanosis- none, clubbing, none, atrophy- none, strength- . +Rolling walker Neuro- + some word searching

## 2017-07-11 NOTE — Assessment & Plan Note (Signed)
She continues to depend on oxygen 2.5 L, sleep and exertion. She would like a Advertising copywriter when available. I don't think she has an active respiratory infection. Especially because of her tremor and history of TIAs, I emphasized reflux precautions.

## 2017-07-11 NOTE — Assessment & Plan Note (Signed)
She thinks she does more throat clearing and light cough if she spends time outdoors, substantially clear when she stays in their air-conditioned and air filter home. Plan-okay to continue nasal sprays when needed.

## 2017-07-11 NOTE — Patient Instructions (Signed)
Continue current meds  Please call as needed

## 2017-07-14 ENCOUNTER — Ambulatory Visit: Payer: Medicare Other | Admitting: Family

## 2017-07-14 ENCOUNTER — Other Ambulatory Visit: Payer: Medicare Other

## 2017-07-17 ENCOUNTER — Other Ambulatory Visit (HOSPITAL_BASED_OUTPATIENT_CLINIC_OR_DEPARTMENT_OTHER): Payer: Medicare Other

## 2017-07-17 ENCOUNTER — Ambulatory Visit (HOSPITAL_BASED_OUTPATIENT_CLINIC_OR_DEPARTMENT_OTHER): Payer: Medicare Other

## 2017-07-17 ENCOUNTER — Ambulatory Visit (HOSPITAL_BASED_OUTPATIENT_CLINIC_OR_DEPARTMENT_OTHER): Payer: Medicare Other | Admitting: Family

## 2017-07-17 VITALS — BP 99/55 | HR 63

## 2017-07-17 VITALS — BP 134/89 | HR 76 | Temp 98.6°F | Resp 21 | Wt 198.0 lb

## 2017-07-17 DIAGNOSIS — D45 Polycythemia vera: Secondary | ICD-10-CM

## 2017-07-17 LAB — CMP (CANCER CENTER ONLY)
ALT(SGPT): 24 U/L (ref 10–47)
AST: 32 U/L (ref 11–38)
Albumin: 3.9 g/dL (ref 3.3–5.5)
Alkaline Phosphatase: 70 U/L (ref 26–84)
BUN, Bld: 14 mg/dL (ref 7–22)
CO2: 32 mEq/L (ref 18–33)
Calcium: 9.4 mg/dL (ref 8.0–10.3)
Chloride: 107 mEq/L (ref 98–108)
Creat: 0.9 mg/dl (ref 0.6–1.2)
Glucose, Bld: 97 mg/dL (ref 73–118)
Potassium: 5 mEq/L — ABNORMAL HIGH (ref 3.3–4.7)
Sodium: 143 mEq/L (ref 128–145)
Total Bilirubin: 0.6 mg/dl (ref 0.20–1.60)
Total Protein: 7.2 g/dL (ref 6.4–8.1)

## 2017-07-17 LAB — CBC WITH DIFFERENTIAL (CANCER CENTER ONLY)
BASO#: 0 10*3/uL (ref 0.0–0.2)
BASO%: 0.4 % (ref 0.0–2.0)
EOS%: 1.8 % (ref 0.0–7.0)
Eosinophils Absolute: 0.1 10*3/uL (ref 0.0–0.5)
HCT: 39.7 % (ref 34.8–46.6)
HGB: 12.4 g/dL (ref 11.6–15.9)
LYMPH#: 1.8 10*3/uL (ref 0.9–3.3)
LYMPH%: 22.1 % (ref 14.0–48.0)
MCH: 23.8 pg — ABNORMAL LOW (ref 26.0–34.0)
MCHC: 31.2 g/dL — ABNORMAL LOW (ref 32.0–36.0)
MCV: 76 fL — ABNORMAL LOW (ref 81–101)
MONO#: 0.7 10*3/uL (ref 0.1–0.9)
MONO%: 8.9 % (ref 0.0–13.0)
NEUT#: 5.3 10*3/uL (ref 1.5–6.5)
NEUT%: 66.8 % (ref 39.6–80.0)
Platelets: 297 10*3/uL (ref 145–400)
RBC: 5.2 10*6/uL (ref 3.70–5.32)
RDW: 16.4 % — ABNORMAL HIGH (ref 11.1–15.7)
WBC: 8 10*3/uL (ref 3.9–10.0)

## 2017-07-17 MED ORDER — SODIUM CHLORIDE 0.9 % IV SOLN
Freq: Once | INTRAVENOUS | Status: AC
  Administered 2017-07-17: 17:00:00 via INTRAVENOUS

## 2017-07-17 NOTE — Progress Notes (Signed)
Hematology and Oncology Follow Up Visit  Natalie Hendrix [id number] 16-Feb-1951 66 y.o. 07/17/2017   Principle Diagnosis:  Polycythemia vera- JAK2 negative  Current Therapy:   Phlebotomy to maintain hematocrit less than 38% Plavix 75 mg by mouth daily    Interim History:  Natalie Hendrix is here today with her husband for follow-up and phlebotomy. She is symptomatic with joint aches and headache. She states she can tell she is in need of phlebotomy. Hct is 39.7.  She was ill last week with fever, cough and diarrhea. She had gone to see her primary care doctor before that and feels that she picked up a virus. She stayed well hydrated and let it run its course. She is feeling better today.  She is sleeping sitting up. They are getting a Sleep number 360 mattress.  Her SOB was a little worse with the virus. She has used 2-3 L supplemental O2 as needed and at night.  No chills, n/v, rash, abdominal pain or changes in bowel or bladder habits.  Her occasional chest pain and palpitations have improved.  No falls or syncopal episodes.  No swelling, numbness or tingling in her extremities.  Her husband will be having a knee replacement on 8/27 and they have several neighbors she can call if she needs any help at home with him.   ECOG Performance Status: 1 - Symptomatic but completely ambulatory  Medications:  Allergies as of 07/17/2017      Reactions   Qvar [beclomethasone]    Spiriva [tiotropium Bromide Monohydrate]    rash   Chlorzoxazone    Epinephrine    Raises heart rate   Lamotrigine    Levofloxacin    Neurontin [gabapentin]    arthralgia   Protriptyline Hcl    Severe constipation   Ropinirole Hcl    arthralgia   Tizanidine    panic   Verapamil    Zonegran    Mood swings   Advair Diskus [fluticasone-salmeterol] Rash   Baclofen Rash   Chlorzoxazone Rash   Levofloxacin Rash   Protriptyline Rash   Ropinirole Rash      Medication List       Accurate as of 07/17/17  4:35 PM.  Always use your most recent med list.          albuterol 108 (90 Base) MCG/ACT inhaler Commonly known as:  PROAIR HFA Inhale 2 puffs into the lungs every 6 (six) hours as needed for wheezing or shortness of breath.   albuterol (2.5 MG/3ML) 0.083% nebulizer solution Commonly known as:  PROVENTIL Take 3 mLs (2.5 mg total) by nebulization every 6 (six) hours as needed for wheezing or shortness of breath.   azelastine 0.1 % nasal spray Commonly known as:  ASTELIN Place 1 spray into both nostrils 2 (two) times daily.   beclomethasone 42 MCG/SPRAY nasal spray Commonly known as:  BECONASE-AQ Place 2 sprays into both nostrils 2 (two) times daily. Dose is for each nostril.   benzonatate 100 MG capsule Commonly known as:  TESSALON Take 1 capsule (100 mg total) by mouth every 6 (six) hours as needed for cough.   buPROPion 300 MG 24 hr tablet Commonly known as:  WELLBUTRIN XL Take 1 tablet (300 mg total) by mouth daily with breakfast.   CALCIUM 1200 PO Take 2,400 mg by mouth daily.   CENTRUM SILVER tablet Take 1 tablet by mouth daily.   clopidogrel 75 MG tablet Commonly known as:  PLAVIX Take 1 tablet (75 mg total) by  mouth daily.   co-enzyme Q-10 30 MG capsule Take 30 mg by mouth daily.   cyclobenzaprine 5 MG tablet Commonly known as:  FLEXERIL Take 5 mg by mouth 2 (two) times daily as needed for muscle spasms. Phillips   doxycycline 100 MG tablet Commonly known as:  VIBRA-TABS Take 2 tabs today then 1 daily   ESTRACE VAGINAL 0.1 MG/GM vaginal cream Generic drug:  estradiol Place 1 Applicatorful vaginally daily.   ezetimibe 10 MG tablet Commonly known as:  ZETIA TAKE 1 TABLET BY MOUTH DAILY.   fexofenadine 180 MG tablet Commonly known as:  ALLEGRA Take 1 tablet (180 mg total) by mouth daily as needed for allergies. As needed   Fish Oil 1200 MG Caps Take 1,200 mg by mouth at bedtime.   hyoscyamine 0.125 MG Tbdp disintergrating tablet Commonly known as:   ANASPAZ Place 1 tablet (0.125 mg total) under the tongue 2 (two) times daily as needed.   lidocaine 5 % Commonly known as:  LIDODERM Place 1 patch onto the skin as needed (pain). Remove & Discard patch within 12 hours or as directed by MD   LORazepam 1 MG tablet Commonly known as:  ATIVAN Take 1 tablet (1 mg total) by mouth 2 (two) times daily. May take extra 3rd dose if needed   magic mouthwash w/lidocaine Soln Take 5 mLs by mouth 4 (four) times daily.   magnesium gluconate 500 MG tablet Commonly known as:  MAGONATE Take 500 mg by mouth daily as needed (constipation).   meclizine 25 MG tablet Commonly known as:  ANTIVERT Take 1 tablet (25 mg total) by mouth 3 (three) times daily as needed for dizziness. 3 month supply   metoprolol succinate 50 MG 24 hr tablet Commonly known as:  TOPROL-XL Take 1 tablet (50 mg total) by mouth daily with breakfast.   morphine 15 MG tablet Commonly known as:  MSIR Take 15 mg by mouth every 4 (four) hours as needed for severe pain.   morphine 30 MG 12 hr tablet Commonly known as:  MS CONTIN Take 30 mg by mouth every 12 (twelve) hours.   Nebulizer Devi Use as directed   neomycin-polymyxin-hydrocortisone 3.5-10000-1 ophthalmic suspension Commonly known as:  CORTISPORIN Place 2 drops into both eyes 3 (three) times daily.   OXYGEN Inhale 2.5 L/hr into the lungs continuous.   pantoprazole 40 MG tablet Commonly known as:  PROTONIX TAKE 1 TABLET (40 MG TOTAL) BY MOUTH DAILY.   pravastatin 40 MG tablet Commonly known as:  PRAVACHOL Take 1 tablet (40 mg total) by mouth at bedtime.   promethazine 25 MG tablet Commonly known as:  PHENERGAN Take 25 mg by mouth every 8 (eight) hours as needed for nausea or vomiting.   SYSTANE 0.4-0.3 % Soln Generic drug:  Polyethyl Glycol-Propyl Glycol Apply 1 drop to eye daily as needed (dry eyes).   TART CHERRY ADVANCED PO Take 5 mLs by mouth daily. Juice Concentrate- 1tsp daily   ZOVIRAX 5 % Generic  drug:  acyclovir ointment Apply 1 application topically as needed (flair).       Allergies:  Allergies  Allergen Reactions  . Qvar [Beclomethasone]   . Spiriva [Tiotropium Bromide Monohydrate]     rash  . Chlorzoxazone   . Epinephrine     Raises heart rate  . Lamotrigine   . Levofloxacin   . Neurontin [Gabapentin]     arthralgia  . Protriptyline Hcl     Severe constipation  . Ropinirole Hcl  arthralgia  . Tizanidine     panic  . Verapamil   . Zonegran     Mood swings  . Advair Diskus [Fluticasone-Salmeterol] Rash  . Baclofen Rash  . Chlorzoxazone Rash  . Levofloxacin Rash  . Protriptyline Rash  . Ropinirole Rash    Past Medical History, Surgical history, Social history, and Family History were reviewed and updated.  Review of Systems: All other 10 point review of systems is negative.   Physical Exam:  weight is 198 lb (89.8 kg). Her oral temperature is 98.6 F (37 C). Her blood pressure is 134/89 and her pulse is 76. Her respiration is 21 (abnormal) and oxygen saturation is 97%.   Wt Readings from Last 3 Encounters:  07/17/17 198 lb (89.8 kg)  07/11/17 201 lb (91.2 kg)  06/16/17 198 lb (89.8 kg)    Ocular: Sclerae unicteric, pupils equal, round and reactive to light Ear-nose-throat: Oropharynx clear, dentition fair Lymphatic: No cervical, supraclavicular or axillary adenopathy Lungs no rales or rhonchi, good excursion bilaterally Heart regular rate and rhythm, no murmur appreciated Abd soft, nontender, positive bowel sounds, no liver or spleen tip palpated on exam, no fluid wave  MSK no focal spinal tenderness, no joint edema Neuro: non-focal, well-oriented, appropriate affect Breasts: Deferred   Lab Results  Component Value Date   WBC 8.0 07/17/2017   HGB 12.4 07/17/2017   HCT 39.7 07/17/2017   MCV 76 (L) 07/17/2017   PLT 297 07/17/2017   Lab Results  Component Value Date   FERRITIN 4 (L) 06/16/2017   IRON 41 06/16/2017   TIBC 476 (H)  06/16/2017   UIBC 435 (H) 06/16/2017   IRONPCTSAT 9 (L) 06/16/2017   Lab Results  Component Value Date   RETICCTPCT 1.1 04/27/2015   RBC 5.20 07/17/2017   RETICCTABS 55.1 04/27/2015   No results found for: KPAFRELGTCHN, LAMBDASER, KAPLAMBRATIO No results found for: IGGSERUM, IGA, IGMSERUM No results found for: Odetta Pink, SPEI   Chemistry      Component Value Date/Time   NA 143 07/17/2017 1534   NA 141 02/17/2017 1132   K 5.0 (H) 07/17/2017 1534   K 5.3 (H) 02/17/2017 1132   CL 107 07/17/2017 1534   CO2 32 07/17/2017 1534   CO2 29 02/17/2017 1132   BUN 14 07/17/2017 1534   BUN 17.8 02/17/2017 1132   CREATININE 0.9 07/17/2017 1534   CREATININE 0.8 02/17/2017 1132      Component Value Date/Time   CALCIUM 9.4 07/17/2017 1534   CALCIUM 9.3 02/17/2017 1132   ALKPHOS 70 07/17/2017 1534   ALKPHOS 79 02/17/2017 1132   AST 32 07/17/2017 1534   AST 22 02/17/2017 1132   ALT 24 07/17/2017 1534   ALT 20 02/17/2017 1132   BILITOT 0.60 07/17/2017 1534   BILITOT 0.39 02/17/2017 1132      Impression and Plan: Ms. Dalia is a very pleasant 66 yo caucasian female with polycythemia. She is symptomatic with joint aches and headache. Hct is 39.7 so we will proceed with her partial phlebotomy and replacement fluids.  We will plan to see her back again in another 4 weeks for follow-up, lab and phlebotomy if needed.  Both she and her husband know to contact our office with any questions or concerns. We can certainly see her sooner if need be.   Eliezer Bottom, NP 8/16/20184:35 PM

## 2017-07-17 NOTE — Progress Notes (Signed)
Natalie Hendrix presents today for phlebotomy per MD orders. Phlebotomy procedure started at 1625 and ended at 1635 via  20g angio cath to left posterior ac. 254 grams removed and 250 ml NS given per order of S. [place] NP.   Patient observed for 30 minutes after procedure without any incident. Patient tolerated procedure well. IV needle removed intact.  VS stable and pressure dressing clean, dry and intact to left ac.

## 2017-07-18 LAB — IRON AND TIBC
%SAT: 7 % — ABNORMAL LOW (ref 21–57)
IRON: 32 ug/dL — AB (ref 41–142)
TIBC: 496 ug/dL — AB (ref 236–444)
UIBC: 464 ug/dL — ABNORMAL HIGH (ref 120–384)

## 2017-07-18 LAB — FERRITIN: Ferritin: 6 ng/ml — ABNORMAL LOW (ref 9–269)

## 2017-07-31 ENCOUNTER — Telehealth: Payer: Self-pay | Admitting: Internal Medicine

## 2017-07-31 MED ORDER — AZITHROMYCIN 250 MG PO TABS: 250.0000 mg | ORAL_TABLET | ORAL | 0 refills | Status: DC

## 2017-07-31 NOTE — Telephone Encounter (Signed)
It might just be seasonal allergies- ragweed is up. It would be ok to send a Zpak to see if that helps    250 mg, # 6, 2 today then one daily Ok to wear a mask when visiting husband.

## 2017-07-31 NOTE — Telephone Encounter (Signed)
Called spoke with patient who c/o increased SOB, hoarseness, prod cough with clear mucus, head congestion with PND x2 days.  Denies any f/c/s, hemoptysis, chest pain.  Taking her albuterol neb, Tessalon 100mg  BID scheduled along with maintenance meds, has been wearing her daytime O2 while moving around the house at 2lpm and increases to 3lpm if she gets too winded.  Pt is aware that CY may not prescribe abx since she has clear mucus - given that she is already immunocompromised she is wondering if she needs to avoid seeing her spouse in the hospital this week for total knee replacement at [facility].  And is there anything additional that she can do to assist with her symptoms?  Dr Annamaria Boots please advise, thank you.

## 2017-07-31 NOTE — Telephone Encounter (Signed)
Pt aware of rec's.  Zpak sent to [facility] pharmacy Nothing further needed.

## 2017-08-07 ENCOUNTER — Encounter (HOSPITAL_COMMUNITY): Payer: Self-pay | Admitting: Emergency Medicine

## 2017-08-07 ENCOUNTER — Emergency Department (HOSPITAL_COMMUNITY)
Admission: EM | Admit: 2017-08-07 | Discharge: 2017-08-07 | Disposition: A | Discharge status: Home / Self Care | Payer: Medicare Other | Attending: Emergency Medicine | Admitting: Emergency Medicine

## 2017-08-07 DIAGNOSIS — R51 Headache: Secondary | ICD-10-CM | POA: Insufficient documentation

## 2017-08-07 DIAGNOSIS — Z5321 Procedure and treatment not carried out due to patient leaving prior to being seen by health care provider: Secondary | ICD-10-CM | POA: Diagnosis not present

## 2017-08-07 DIAGNOSIS — H538 Other visual disturbances: Secondary | ICD-10-CM | POA: Insufficient documentation

## 2017-08-07 NOTE — ED Notes (Signed)
Pt to room from triage and inquiring about wait times. Explained to pt that the EDP would see her as soon as they could. Pt decided she would rather go home.

## 2017-08-07 NOTE — ED Triage Notes (Signed)
Pt c/o headache x 6 days with vision changes in the left eye.

## 2017-08-28 ENCOUNTER — Other Ambulatory Visit: Payer: Self-pay | Admitting: Internal Medicine

## 2017-08-28 MED ORDER — BECLOMETHASONE DIPROP MONOHYD 42 MCG/SPRAY NA SUSP: 2.0000 | Freq: Two times a day (BID) | NASAL | 2 refills | Status: DC

## 2017-09-05 ENCOUNTER — Other Ambulatory Visit (HOSPITAL_BASED_OUTPATIENT_CLINIC_OR_DEPARTMENT_OTHER): Payer: Medicare Other

## 2017-09-05 ENCOUNTER — Ambulatory Visit (HOSPITAL_BASED_OUTPATIENT_CLINIC_OR_DEPARTMENT_OTHER): Payer: Medicare Other | Admitting: Family

## 2017-09-05 ENCOUNTER — Encounter: Payer: Self-pay | Admitting: Family

## 2017-09-05 ENCOUNTER — Ambulatory Visit (HOSPITAL_BASED_OUTPATIENT_CLINIC_OR_DEPARTMENT_OTHER): Payer: Medicare Other

## 2017-09-05 VITALS — BP 105/87 | HR 78 | Temp 98.0°F | Resp 18 | Wt 192.0 lb

## 2017-09-05 VITALS — BP 122/76 | HR 72 | Resp 18

## 2017-09-05 DIAGNOSIS — R5383 Other fatigue: Secondary | ICD-10-CM

## 2017-09-05 DIAGNOSIS — D500 Iron deficiency anemia secondary to blood loss (chronic): Secondary | ICD-10-CM

## 2017-09-05 DIAGNOSIS — D45 Polycythemia vera: Secondary | ICD-10-CM

## 2017-09-05 LAB — CMP (CANCER CENTER ONLY)
ALBUMIN: 3.4 g/dL (ref 3.3–5.5)
ALT(SGPT): 26 U/L (ref 10–47)
AST: 26 U/L (ref 11–38)
Alkaline Phosphatase: 61 U/L (ref 26–84)
BUN, Bld: 16 mg/dL (ref 7–22)
CALCIUM: 9.1 mg/dL (ref 8.0–10.3)
CHLORIDE: 108 meq/L (ref 98–108)
CO2: 28 mEq/L (ref 18–33)
CREATININE: 0.9 mg/dL (ref 0.6–1.2)
Glucose, Bld: 102 mg/dL (ref 73–118)
POTASSIUM: 4.4 meq/L (ref 3.3–4.7)
SODIUM: 144 meq/L (ref 128–145)
TOTAL PROTEIN: 6.5 g/dL (ref 6.4–8.1)
Total Bilirubin: 0.5 mg/dl (ref 0.20–1.60)

## 2017-09-05 LAB — CBC WITH DIFFERENTIAL (CANCER CENTER ONLY)
BASO#: 0 10*3/uL (ref 0.0–0.2)
BASO%: 0.6 % (ref 0.0–2.0)
EOS ABS: 0.1 10*3/uL (ref 0.0–0.5)
EOS%: 2 % (ref 0.0–7.0)
HEMATOCRIT: 38.2 % (ref 34.8–46.6)
HGB: 11.9 g/dL (ref 11.6–15.9)
LYMPH#: 1.3 10*3/uL (ref 0.9–3.3)
LYMPH%: 25.2 % (ref 14.0–48.0)
MCH: 23.5 pg — AB (ref 26.0–34.0)
MCHC: 31.2 g/dL — ABNORMAL LOW (ref 32.0–36.0)
MCV: 76 fL — AB (ref 81–101)
MONO#: 0.6 10*3/uL (ref 0.1–0.9)
MONO%: 10.8 % (ref 0.0–13.0)
NEUT#: 3.1 10*3/uL (ref 1.5–6.5)
NEUT%: 61.4 % (ref 39.6–80.0)
PLATELETS: 286 10*3/uL (ref 145–400)
RBC: 5.06 10*6/uL (ref 3.70–5.32)
RDW: 15.3 % (ref 11.1–15.7)
WBC: 5.1 10*3/uL (ref 3.9–10.0)

## 2017-09-05 LAB — IRON AND TIBC
%SAT: 7 % — AB (ref 21–57)
IRON: 32 ug/dL — AB (ref 41–142)
TIBC: 483 ug/dL — AB (ref 236–444)
UIBC: 451 ug/dL — ABNORMAL HIGH (ref 120–384)

## 2017-09-05 LAB — FERRITIN: FERRITIN: 5 ng/mL — AB (ref 9–269)

## 2017-09-05 MED ORDER — SODIUM CHLORIDE 0.9 % IV SOLN
Freq: Once | INTRAVENOUS | Status: AC
Start: 2017-09-05 — End: 2017-09-05
  Administered 2017-09-05: 12:00:00 via INTRAVENOUS

## 2017-09-05 NOTE — Progress Notes (Signed)
Hematology and Oncology Follow Up Visit  Natalie Hendrix [id number] [birth year] 67 y.o. 09/05/2017   Principle Diagnosis:  Polycythemia vera- JAK2 negative  Current Therapy:   Phlebotomy to maintain hematocrit less than 38% Plavix 75 mg by mouth daily    Interim History:  Natalie Hendrix is here today for follow-up. She is still having issues with migraines she feels have been exacerbated by stress. Unfortunately, her husband Natalie Hendrix has had a rough recovery from her knee surgery last month. He spent several weeks in the ICU and is happy to finally be home. 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  She does have occasional dizziness but denies any recent falls or syncope.  She is using a cane when ambulating and a walker if needed.  Her Hct is 38.2 and she feels that she needs a "mini" phlebotomy and replacement fluids.  She denies fever, chills, n/v, cough, rash, chest pain, palpitations, abdominal pain or changes in bowel or bladder habits.  She is on supplemental O2 24 hours a day and SOB with exertion is unchanged.  No swelling, numbness or tingling in her extremities.  She has a good appetite and is staying well hydrated. Her weight is stable.   ECOG Performance Status: 1 - Symptomatic but completely ambulatory  Medications:  Allergies as of 09/05/2017      Reactions   Qvar [beclomethasone]    Spiriva [tiotropium Bromide Monohydrate]    rash   Chlorzoxazone    Epinephrine    Raises heart rate   Lamotrigine    Levofloxacin    Neurontin [gabapentin]    arthralgia   Protriptyline Hcl    Severe constipation   Ropinirole Hcl    arthralgia   Tizanidine    panic   Verapamil    Zonegran    Mood swings   Advair Diskus [fluticasone-salmeterol] Rash   Baclofen Rash   Chlorzoxazone Rash   Levofloxacin Rash   Protriptyline Rash   Ropinirole Rash      Medication List       Accurate as of 09/05/17 11:45 AM. Always use your most recent med list.          albuterol 108 (90 Base) MCG/ACT inhaler Commonly known as:  PROAIR HFA Inhale 2 puffs into the lungs every 6 (six) hours as needed for wheezing or shortness of breath.   albuterol (2.5 MG/3ML) 0.083% nebulizer solution Commonly known as:  PROVENTIL Take 3 mLs (2.5 mg total) by nebulization every 6 (six) hours as needed for wheezing or shortness of breath.   azelastine 0.1 % nasal spray Commonly known as:  ASTELIN Place 1 spray into both nostrils 2 (two) times daily.   azithromycin 250 MG tablet Commonly known as:  ZITHROMAX Take 1 tablet (250 mg total) by mouth as directed.   beclomethasone 42 MCG/SPRAY nasal spray Commonly known as:  BECONASE-AQ Place 2 sprays into both nostrils 2 (two) times daily. Dose is for each nostril.   benzonatate 100 MG capsule Commonly known as:  TESSALON Take 1 capsule (100 mg total) by mouth every 6 (six) hours as needed for cough.   buPROPion 300 MG 24 hr tablet Commonly known as:  WELLBUTRIN XL Take 1 tablet (300 mg total) by mouth daily with breakfast.   CALCIUM 1200 PO Take 2,400 mg by mouth daily.   CENTRUM SILVER tablet Take 1 tablet by mouth daily.   clopidogrel 75 MG tablet Commonly known as:  PLAVIX  Take 1 tablet (75 mg total) by mouth daily.   co-enzyme Q-10 30 MG capsule Take 30 mg by mouth daily.   cyclobenzaprine 5 MG tablet Commonly known as:  FLEXERIL Take 5 mg by mouth 2 (two) times daily as needed for muscle spasms. Phillips   doxycycline 100 MG tablet Commonly known as:  VIBRA-TABS Take 2 tabs today then 1 daily   ESTRACE VAGINAL 0.1 MG/GM vaginal cream Generic drug:  estradiol Place 1 Applicatorful vaginally daily.   ezetimibe 10 MG tablet Commonly known as:  ZETIA TAKE 1 TABLET BY MOUTH DAILY.   fexofenadine 180 MG tablet Commonly known as:  ALLEGRA Take 1 tablet (180 mg total) by mouth daily as needed for allergies. As needed   Fish Oil 1200 MG Caps Take 1,200 mg by mouth at bedtime.   hyoscyamine  0.125 MG Tbdp disintergrating tablet Commonly known as:  ANASPAZ Place 1 tablet (0.125 mg total) under the tongue 2 (two) times daily as needed.   lidocaine 5 % Commonly known as:  LIDODERM Place 1 patch onto the skin as needed (pain). Remove & Discard patch within 12 hours or as directed by MD   LORazepam 1 MG tablet Commonly known as:  ATIVAN Take 1 tablet (1 mg total) by mouth 2 (two) times daily. May take extra 3rd dose if needed   magic mouthwash w/lidocaine Soln Take 5 mLs by mouth 4 (four) times daily.   magnesium gluconate 500 MG tablet Commonly known as:  MAGONATE Take 500 mg by mouth daily as needed (constipation).   meclizine 25 MG tablet Commonly known as:  ANTIVERT Take 1 tablet (25 mg total) by mouth 3 (three) times daily as needed for dizziness. 3 month supply   metoprolol succinate 50 MG 24 hr tablet Commonly known as:  TOPROL-XL Take 1 tablet (50 mg total) by mouth daily with breakfast.   morphine 15 MG tablet Commonly known as:  MSIR Take 15 mg by mouth every 4 (four) hours as needed for severe pain.   morphine 30 MG 12 hr tablet Commonly known as:  MS CONTIN Take 30 mg by mouth every 12 (twelve) hours.   Nebulizer Devi Use as directed   neomycin-polymyxin-hydrocortisone 3.5-10000-1 ophthalmic suspension Commonly known as:  CORTISPORIN Place 2 drops into both eyes 3 (three) times daily.   OXYGEN Inhale 2.5 L/hr into the lungs continuous.   pantoprazole 40 MG tablet Commonly known as:  PROTONIX TAKE 1 TABLET (40 MG TOTAL) BY MOUTH DAILY.   pravastatin 40 MG tablet Commonly known as:  PRAVACHOL Take 1 tablet (40 mg total) by mouth at bedtime.   promethazine 25 MG tablet Commonly known as:  PHENERGAN Take 25 mg by mouth every 8 (eight) hours as needed for nausea or vomiting.   SYSTANE 0.4-0.3 % Soln Generic drug:  Polyethyl Glycol-Propyl Glycol Apply 1 drop to eye daily as needed (dry eyes).   TART CHERRY ADVANCED PO Take 5 mLs by mouth  daily. Juice Concentrate- 1tsp daily   ZOVIRAX 5 % Generic drug:  acyclovir ointment Apply 1 application topically as needed (flair).       Allergies:  Allergies  Allergen Reactions  . Qvar [Beclomethasone]   . Spiriva [Tiotropium Bromide Monohydrate]     rash  . Chlorzoxazone   . Epinephrine     Raises heart rate  . Lamotrigine   . Levofloxacin   . Neurontin [Gabapentin]     arthralgia  . Protriptyline Hcl     Severe constipation  .  Ropinirole Hcl     arthralgia  . Tizanidine     panic  . Verapamil   . Zonegran     Mood swings  . Advair Diskus [Fluticasone-Salmeterol] Rash  . Baclofen Rash  . Chlorzoxazone Rash  . Levofloxacin Rash  . Protriptyline Rash  . Ropinirole Rash    Past Medical History, Surgical history, Social history, and Family History were reviewed and updated.  Review of Systems: All other 10 point review of systems is negative.   Physical Exam:  weight is 192 lb (87.1 kg). Her oral temperature is 98 F (36.7 C). Her blood pressure is 105/87 and her pulse is 78. Her respiration is 18 and oxygen saturation is 100%.   Wt Readings from Last 3 Encounters:  09/05/17 192 lb (87.1 kg)  07/17/17 198 lb (89.8 kg)  07/11/17 201 lb (91.2 kg)    Ocular: Sclerae unicteric, pupils equal, round and reactive to light Ear-nose-throat: Oropharynx clear, dentition fair Lymphatic: No cervical, supraclavicular or axillary adenopathy Lungs no rales or rhonchi, good excursion bilaterally Heart regular rate and rhythm, no murmur appreciated Abd soft, nontender, positive bowel sounds, no liver or spleen tip palpated on exam, no fluid wave MSK no focal spinal tenderness, no joint edema Neuro: non-focal, well-oriented, appropriate affect Breasts: Deferred   Lab Results  Component Value Date   WBC 5.1 09/05/2017   HGB 11.9 09/05/2017   HCT 38.2 09/05/2017   MCV 76 (L) 09/05/2017   PLT 286 09/05/2017   Lab Results  Component Value Date   FERRITIN 6 (L)  07/17/2017   IRON 32 (L) 07/17/2017   TIBC 496 (H) 07/17/2017   UIBC 464 (H) 07/17/2017   IRONPCTSAT 7 (L) 07/17/2017   Lab Results  Component Value Date   RETICCTPCT 1.1 04/27/2015   RBC 5.06 09/05/2017   RETICCTABS 55.1 04/27/2015   No results found for: KPAFRELGTCHN, LAMBDASER, KAPLAMBRATIO No results found for: IGGSERUM, IGA, IGMSERUM No results found for: Odetta Pink, SPEI   Chemistry      Component Value Date/Time   NA 143 07/17/2017 1534   NA 141 02/17/2017 1132   K 5.0 (H) 07/17/2017 1534   K 5.3 (H) 02/17/2017 1132   CL 107 07/17/2017 1534   CO2 32 07/17/2017 1534   CO2 29 02/17/2017 1132   BUN 14 07/17/2017 1534   BUN 17.8 02/17/2017 1132   CREATININE 0.9 07/17/2017 1534   CREATININE 0.8 02/17/2017 1132      Component Value Date/Time   CALCIUM 9.4 07/17/2017 1534   CALCIUM 9.3 02/17/2017 1132   ALKPHOS 70 07/17/2017 1534   ALKPHOS 79 02/17/2017 1132   AST 32 07/17/2017 1534   AST 22 02/17/2017 1132   ALT 24 07/17/2017 1534   ALT 20 02/17/2017 1132   BILITOT 0.60 07/17/2017 1534   BILITOT 0.39 02/17/2017 1132      Impression and Plan: Ms. Wimer is a very pleasant  66 yo caucasian female with polycythemia. She is symptomatic with worsening migraines and some fatigue. She would like a mini phlebotomy for her Hct of 38.2. We will also give her replacement fluids.  We will plan to see her back again in another 4 weeks for follow-up, lab and phlebotomy if needed.  She will contact our office with any questions or concerns. We can certainly see her sooner if need be.   Eliezer Bottom, NP 10/5/201811:45 AM

## 2017-09-05 NOTE — Progress Notes (Signed)
Natalie Hendrix presents today for phlebotomy per MD orders. Phlebotomy procedure started at 1320 and ended at 1338. 263 grams removed from rt [facility] using 22g IV cath for a "mini" phlebotomy Patient observed for 30 minutes after procedure without any incident. Patient tolerated procedure well. IV needle removed intact.

## 2017-09-05 NOTE — Progress Notes (Signed)
Natalie Hendrix presents today for phlebotomy per MD orders. Phlebotomy procedure started at 1310 and ended at 1320 to right posterior ac via 20 g angio cath per Uvaldo Bristle RN.  Unable to obtain phlebotomy via 20 g cath to left ac.  IVF's given via left ac site.  250 grams removed per order of S. [place] NP.  Patient observed for 30 minutes after procedure without any incident. Patient tolerated procedure well. IV needle removed intact. VS stable and pressure dressing clean, dry and intact to right ac at time of discharge.

## 2017-10-03 ENCOUNTER — Ambulatory Visit: Payer: Medicare Other | Admitting: Family

## 2017-10-03 ENCOUNTER — Other Ambulatory Visit: Payer: Medicare Other

## 2017-10-08 ENCOUNTER — Ambulatory Visit (HOSPITAL_BASED_OUTPATIENT_CLINIC_OR_DEPARTMENT_OTHER): Payer: Medicare Other | Admitting: Family

## 2017-10-08 ENCOUNTER — Other Ambulatory Visit (HOSPITAL_BASED_OUTPATIENT_CLINIC_OR_DEPARTMENT_OTHER): Payer: Medicare Other

## 2017-10-08 ENCOUNTER — Encounter: Payer: Self-pay | Admitting: Family

## 2017-10-08 ENCOUNTER — Other Ambulatory Visit: Payer: Self-pay

## 2017-10-08 VITALS — BP 128/66 | HR 77 | Temp 98.1°F | Resp 20 | Wt 199.0 lb

## 2017-10-08 DIAGNOSIS — D45 Polycythemia vera: Secondary | ICD-10-CM

## 2017-10-08 DIAGNOSIS — R5383 Other fatigue: Secondary | ICD-10-CM | POA: Diagnosis not present

## 2017-10-08 DIAGNOSIS — D500 Iron deficiency anemia secondary to blood loss (chronic): Secondary | ICD-10-CM

## 2017-10-08 LAB — CBC WITH DIFFERENTIAL (CANCER CENTER ONLY)
BASO#: 0 10*3/uL (ref 0.0–0.2)
BASO%: 0.4 % (ref 0.0–2.0)
EOS ABS: 0.1 10*3/uL (ref 0.0–0.5)
EOS%: 2.5 % (ref 0.0–7.0)
HEMATOCRIT: 36.9 % (ref 34.8–46.6)
HEMOGLOBIN: 11.5 g/dL — AB (ref 11.6–15.9)
LYMPH#: 1.3 10*3/uL (ref 0.9–3.3)
LYMPH%: 23.9 % (ref 14.0–48.0)
MCH: 23.4 pg — AB (ref 26.0–34.0)
MCHC: 31.2 g/dL — ABNORMAL LOW (ref 32.0–36.0)
MCV: 75 fL — AB (ref 81–101)
MONO#: 0.5 10*3/uL (ref 0.1–0.9)
MONO%: 9.1 % (ref 0.0–13.0)
NEUT%: 64.1 % (ref 39.6–80.0)
NEUTROS ABS: 3.6 10*3/uL (ref 1.5–6.5)
Platelets: 251 10*3/uL (ref 145–400)
RBC: 4.91 10*6/uL (ref 3.70–5.32)
RDW: 16.4 % — ABNORMAL HIGH (ref 11.1–15.7)
WBC: 5.6 10*3/uL (ref 3.9–10.0)

## 2017-10-08 LAB — CMP (CANCER CENTER ONLY)
ALT(SGPT): 26 U/L (ref 10–47)
AST: 32 U/L (ref 11–38)
Albumin: 3.4 g/dL (ref 3.3–5.5)
Alkaline Phosphatase: 59 U/L (ref 26–84)
BUN, Bld: 15 mg/dL (ref 7–22)
CHLORIDE: 106 meq/L (ref 98–108)
CO2: 30 mEq/L (ref 18–33)
Calcium: 9 mg/dL (ref 8.0–10.3)
Creat: 1 mg/dl (ref 0.6–1.2)
Glucose, Bld: 91 mg/dL (ref 73–118)
POTASSIUM: 4.6 meq/L (ref 3.3–4.7)
Sodium: 147 mEq/L — ABNORMAL HIGH (ref 128–145)
TOTAL PROTEIN: 6.4 g/dL (ref 6.4–8.1)
Total Bilirubin: 0.5 mg/dl (ref 0.20–1.60)

## 2017-10-08 NOTE — Progress Notes (Signed)
Hematology and Oncology Follow Up Visit  Natalie Hendrix [id number] 1951/11/19 66 y.o. 10/08/2017   Principle Diagnosis:  Polycythemia vera- JAK2 negative  Current Therapy:   Phlebotomy to maintain hematocrit less than 38% Plavix 75 mg by mouth daily   Interim History:  Natalie Hendrix is here today for follow-up. She is still having some fatigue. Her headache has been a little better.  She has had some generalized itching which she attributes to allergies. No visible rash.  She has had no fever, chills, n/v, cough, chest pain, abdominal pain or changes in bowel or bladder habits.  Her SOB with exertion is unchanged. She is still on 2L [place] supplemental O2.  She has occasional palpitations and states that she has history of PVC's.  She has history of vertigo and dizziness. No falls or syncopal episodes.  She has a good appetite and is staying well hydrated. Her weight is stable.  She uses magnesium spray and mustard under her tongue for muscle cramps.  No swelling, numbness or tingling in her extremities.   ECOG Performance Status: 2 - Symptomatic, <50% confined to bed  Medications:  Allergies as of 10/08/2017      Reactions   Qvar [beclomethasone]    Spiriva [tiotropium Bromide Monohydrate]    rash   Chlorzoxazone    Epinephrine    Raises heart rate   Lamotrigine    Levofloxacin    Neurontin [gabapentin]    arthralgia   Protriptyline Hcl    Severe constipation   Ropinirole Hcl    arthralgia   Tizanidine    panic   Verapamil    Zonegran    Mood swings   Advair Diskus [fluticasone-salmeterol] Rash   Baclofen Rash   Chlorzoxazone Rash   Levofloxacin Rash   Protriptyline Rash   Ropinirole Rash      Medication List        Accurate as of 10/08/17  3:15 PM. Always use your most recent med list.          albuterol 108 (90 Base) MCG/ACT inhaler Commonly known as:  PROAIR HFA Inhale 2 puffs into the lungs every 6 (six) hours as needed for wheezing or shortness of  breath.   albuterol (2.5 MG/3ML) 0.083% nebulizer solution Commonly known as:  PROVENTIL Take 3 mLs (2.5 mg total) by nebulization every 6 (six) hours as needed for wheezing or shortness of breath.   azelastine 0.1 % nasal spray Commonly known as:  ASTELIN Place 1 spray into both nostrils 2 (two) times daily.   azithromycin 250 MG tablet Commonly known as:  ZITHROMAX Take 1 tablet (250 mg total) by mouth as directed.   beclomethasone 42 MCG/SPRAY nasal spray Commonly known as:  BECONASE-AQ Place 2 sprays into both nostrils 2 (two) times daily. Dose is for each nostril.   benzonatate 100 MG capsule Commonly known as:  TESSALON Take 1 capsule (100 mg total) by mouth every 6 (six) hours as needed for cough.   buPROPion 300 MG 24 hr tablet Commonly known as:  WELLBUTRIN XL Take 1 tablet (300 mg total) by mouth daily with breakfast.   CALCIUM 1200 PO Take 2,400 mg by mouth daily.   CENTRUM SILVER tablet Take 1 tablet by mouth daily.   clopidogrel 75 MG tablet Commonly known as:  PLAVIX Take 1 tablet (75 mg total) by mouth daily.   co-enzyme Q-10 30 MG capsule Take 30 mg by mouth daily.   cyclobenzaprine 5 MG tablet Commonly known as:  FLEXERIL  Take 5 mg by mouth 2 (two) times daily as needed for muscle spasms. Phillips   doxycycline 100 MG tablet Commonly known as:  VIBRA-TABS Take 2 tabs today then 1 daily   ESTRACE VAGINAL 0.1 MG/GM vaginal cream Generic drug:  estradiol Place 1 Applicatorful vaginally daily.   ezetimibe 10 MG tablet Commonly known as:  ZETIA TAKE 1 TABLET BY MOUTH DAILY.   fexofenadine 180 MG tablet Commonly known as:  ALLEGRA Take 1 tablet (180 mg total) by mouth daily as needed for allergies. As needed   Fish Oil 1200 MG Caps Take 1,200 mg by mouth at bedtime.   hyoscyamine 0.125 MG Tbdp disintergrating tablet Commonly known as:  ANASPAZ Place 1 tablet (0.125 mg total) under the tongue 2 (two) times daily as needed.   lidocaine 5  % Commonly known as:  LIDODERM Place 1 patch onto the skin as needed (pain). Remove & Discard patch within 12 hours or as directed by MD   LORazepam 1 MG tablet Commonly known as:  ATIVAN Take 1 tablet (1 mg total) by mouth 2 (two) times daily. May take extra 3rd dose if needed   magic mouthwash w/lidocaine Soln Take 5 mLs by mouth 4 (four) times daily.   magnesium gluconate 500 MG tablet Commonly known as:  MAGONATE Take 500 mg by mouth daily as needed (constipation).   meclizine 25 MG tablet Commonly known as:  ANTIVERT Take 1 tablet (25 mg total) by mouth 3 (three) times daily as needed for dizziness. 3 month supply   metoprolol succinate 50 MG 24 hr tablet Commonly known as:  TOPROL-XL Take 1 tablet (50 mg total) by mouth daily with breakfast.   morphine 15 MG tablet Commonly known as:  MSIR Take 15 mg by mouth every 4 (four) hours as needed for severe pain.   morphine 30 MG 12 hr tablet Commonly known as:  MS CONTIN Take 30 mg by mouth every 12 (twelve) hours.   Nebulizer Devi Use as directed   neomycin-polymyxin-hydrocortisone 3.5-10000-1 ophthalmic suspension Commonly known as:  CORTISPORIN Place 2 drops into both eyes 3 (three) times daily.   OXYGEN Inhale 2.5 L/hr into the lungs continuous.   pantoprazole 40 MG tablet Commonly known as:  PROTONIX TAKE 1 TABLET (40 MG TOTAL) BY MOUTH DAILY.   pravastatin 40 MG tablet Commonly known as:  PRAVACHOL Take 1 tablet (40 mg total) by mouth at bedtime.   promethazine 25 MG tablet Commonly known as:  PHENERGAN Take 25 mg by mouth every 8 (eight) hours as needed for nausea or vomiting.   SYSTANE 0.4-0.3 % Soln Generic drug:  Polyethyl Glycol-Propyl Glycol Apply 1 drop to eye daily as needed (dry eyes).   TART CHERRY ADVANCED PO Take 5 mLs by mouth daily. Juice Concentrate- 1tsp daily   ZOVIRAX 5 % Generic drug:  acyclovir ointment Apply 1 application topically as needed (flair).       Allergies:   Allergies  Allergen Reactions  . Qvar [Beclomethasone]   . Spiriva [Tiotropium Bromide Monohydrate]     rash  . Chlorzoxazone   . Epinephrine     Raises heart rate  . Lamotrigine   . Levofloxacin   . Neurontin [Gabapentin]     arthralgia  . Protriptyline Hcl     Severe constipation  . Ropinirole Hcl     arthralgia  . Tizanidine     panic  . Verapamil   . Zonegran     Mood swings  . Advair Diskus [  Fluticasone-Salmeterol] Rash  . Baclofen Rash  . Chlorzoxazone Rash  . Levofloxacin Rash  . Protriptyline Rash  . Ropinirole Rash    Past Medical History, Surgical history, Social history, and Family History were reviewed and updated.  Review of Systems: All other 10 point review of systems is negative.   Physical Exam:  weight is 199 lb (90.3 kg). Her oral temperature is 98.1 F (36.7 C). Her blood pressure is 128/66 and her pulse is 77. Her respiration is 20 and oxygen saturation is 97%.   Wt Readings from Last 3 Encounters:  10/08/17 199 lb (90.3 kg)  09/05/17 192 lb (87.1 kg)  07/17/17 198 lb (89.8 kg)    Ocular: Sclerae unicteric, pupils equal, round and reactive to light Ear-nose-throat: Oropharynx clear, dentition fair Lymphatic: No cervical, supraclavicular or axillary adenopathy Lungs no rales or rhonchi, good excursion bilaterally Heart regular rate and rhythm, no murmur appreciated Abd soft, nontender, positive bowel sounds, no liver or spleen tip palpated on exam, no fluid wave  MSK no focal spinal tenderness, no joint edema Neuro: non-focal, well-oriented, appropriate affect Breasts: Deferred   Lab Results  Component Value Date   WBC 5.6 10/08/2017   HGB 11.5 (L) 10/08/2017   HCT 36.9 10/08/2017   MCV 75 (L) 10/08/2017   PLT 251 10/08/2017   Lab Results  Component Value Date   FERRITIN 5 (L) 09/05/2017   IRON 32 (L) 09/05/2017   TIBC 483 (H) 09/05/2017   UIBC 451 (H) 09/05/2017   IRONPCTSAT 7 (L) 09/05/2017   Lab Results  Component Value  Date   RETICCTPCT 1.1 04/27/2015   RBC 4.91 10/08/2017   RETICCTABS 55.1 04/27/2015   No results found for: KPAFRELGTCHN, LAMBDASER, KAPLAMBRATIO No results found for: IGGSERUM, IGA, IGMSERUM No results found for: Kathrynn Ducking, MSPIKE, SPEI   Chemistry      Component Value Date/Time   NA 147 (H) 10/08/2017 1410   NA 141 02/17/2017 1132   K 4.6 10/08/2017 1410   K 5.3 (H) 02/17/2017 1132   CL 106 10/08/2017 1410   CO2 30 10/08/2017 1410   CO2 29 02/17/2017 1132   BUN 15 10/08/2017 1410   BUN 17.8 02/17/2017 1132   CREATININE 1.0 10/08/2017 1410   CREATININE 0.8 02/17/2017 1132      Component Value Date/Time   CALCIUM 9.0 10/08/2017 1410   CALCIUM 9.3 02/17/2017 1132   ALKPHOS 59 10/08/2017 1410   ALKPHOS 79 02/17/2017 1132   AST 32 10/08/2017 1410   AST 22 02/17/2017 1132   ALT 26 10/08/2017 1410   ALT 20 02/17/2017 1132   BILITOT 0.50 10/08/2017 1410   BILITOT 0.39 02/17/2017 1132      Impression and Plan: Ms. Gotay is a very pleasant 66 yo caucasian female with polycythemia. She is still feeling fatigued and has the occasional migraine. Her Hct today is 36.9 so she will not need a phlebotomy this visit.  She will be having a root canal in the next few weeks so we will have her stop the Plavix 4 days before the procedure and restarted that day of the procedure later in the day after it is complete.  We will plan to see her back again in another 6 weeks for follow-up, lab and phlebotomy if needed.  She will contact our office with any questions or concerns. We can certainly see her sooner if need be.   Eliezer Bottom, NP 11/7/20183:15 PM

## 2017-10-09 ENCOUNTER — Telehealth: Payer: Self-pay | Admitting: *Deleted

## 2017-10-09 LAB — IRON AND TIBC
%SAT: 7 % — ABNORMAL LOW (ref 21–57)
Iron: 32 ug/dL — ABNORMAL LOW (ref 41–142)
TIBC: 468 ug/dL — AB (ref 236–444)
UIBC: 436 ug/dL — AB (ref 120–384)

## 2017-10-09 LAB — FERRITIN: Ferritin: 6 ng/ml — ABNORMAL LOW (ref 9–269)

## 2017-10-09 NOTE — Telephone Encounter (Addendum)
Patient is aware of instructions. Confirmed with teach back  ----- Message from Eliezer Bottom, NP sent at 10/08/2017  4:30 PM EST ----- Regarding: Plavix instructions... She will need to stop her Plavix 4 days before her root canal and restart the day of the procedure later that evening once it is complete. Please call with any other questions. Thank you!   Sarah  ----- Message ----- From: Interface, Lab In Three Zero One Sent: 10/08/2017   2:23 PM To: Eliezer Bottom, NP

## 2017-11-20 ENCOUNTER — Ambulatory Visit: Payer: Medicare Other | Admitting: Family

## 2017-11-20 ENCOUNTER — Other Ambulatory Visit: Payer: Medicare Other

## 2017-12-01 ENCOUNTER — Other Ambulatory Visit: Payer: Medicare Other

## 2017-12-01 ENCOUNTER — Ambulatory Visit: Payer: Medicare Other | Admitting: Family

## 2017-12-09 ENCOUNTER — Inpatient Hospital Stay: Payer: Medicare Other

## 2017-12-09 ENCOUNTER — Inpatient Hospital Stay: Payer: Medicare Other | Admitting: Family

## 2017-12-15 ENCOUNTER — Inpatient Hospital Stay: Payer: Medicare Other

## 2017-12-15 ENCOUNTER — Inpatient Hospital Stay: Payer: Medicare Other | Admitting: Family

## 2017-12-16 ENCOUNTER — Inpatient Hospital Stay (HOSPITAL_BASED_OUTPATIENT_CLINIC_OR_DEPARTMENT_OTHER): Payer: Medicare Other | Admitting: Family

## 2017-12-16 ENCOUNTER — Other Ambulatory Visit: Payer: Self-pay

## 2017-12-16 ENCOUNTER — Inpatient Hospital Stay: Payer: Medicare Other | Attending: Hematology & Oncology

## 2017-12-16 ENCOUNTER — Inpatient Hospital Stay: Payer: Medicare Other

## 2017-12-16 ENCOUNTER — Other Ambulatory Visit: Payer: Self-pay | Admitting: Family

## 2017-12-16 VITALS — BP 110/61 | HR 77 | Temp 98.0°F | Resp 24 | Wt 200.0 lb

## 2017-12-16 VITALS — BP 122/74 | HR 72 | Resp 18

## 2017-12-16 DIAGNOSIS — D45 Polycythemia vera: Secondary | ICD-10-CM | POA: Diagnosis present

## 2017-12-16 DIAGNOSIS — D500 Iron deficiency anemia secondary to blood loss (chronic): Secondary | ICD-10-CM

## 2017-12-16 LAB — CMP (CANCER CENTER ONLY)
ALK PHOS: 73 U/L (ref 40–150)
ALT: 22 U/L (ref 0–55)
ANION GAP: 7 (ref 3–11)
AST: 26 U/L (ref 5–34)
Albumin: 4 g/dL (ref 3.5–5.0)
BILIRUBIN TOTAL: 0.4 mg/dL (ref 0.2–1.2)
BUN: 17 mg/dL (ref 7–26)
CALCIUM: 9.3 mg/dL (ref 8.4–10.4)
CO2: 27 mmol/L (ref 22–29)
CREATININE: 0.87 mg/dL (ref 0.60–1.10)
Chloride: 106 mmol/L (ref 98–109)
GFR, Estimated: >60 mL/min (ref >60)
Glucose, Bld: 97 mg/dL (ref 70–140)
Potassium: 4.4 mmol/L (ref 3.3–4.7)
SODIUM: 140 mmol/L (ref 136–145)
Total Protein: 7 g/dL (ref 6.4–8.3)

## 2017-12-16 LAB — CBC WITH DIFFERENTIAL (CANCER CENTER ONLY)
Basophils Absolute: 0 10*3/uL (ref 0.0–0.1)
Basophils Relative: 1 %
Eosinophils Absolute: 0.1 10*3/uL (ref 0.0–0.5)
Eosinophils Relative: 2 %
HEMATOCRIT: 43.7 % (ref 34.8–46.6)
HEMOGLOBIN: 14 g/dL (ref 11.6–15.9)
LYMPHS ABS: 1.7 10*3/uL (ref 0.9–3.3)
LYMPHS PCT: 28 %
MCH: 25.1 pg — AB (ref 26.0–34.0)
MCHC: 32 g/dL (ref 32.0–36.0)
MCV: 78.3 fL — AB (ref 81.0–101.0)
MONOS PCT: 9 %
Monocytes Absolute: 0.6 10*3/uL (ref 0.1–0.9)
NEUTROS ABS: 3.6 10*3/uL (ref 1.5–6.5)
NEUTROS PCT: 60 %
Platelet Count: 239 10*3/uL (ref 145–400)
RBC: 5.58 MIL/uL — AB (ref 3.70–5.32)
RDW: 19.3 % — ABNORMAL HIGH (ref 11.1–15.7)
WBC: 6 10*3/uL (ref 3.9–10.3)

## 2017-12-16 MED ORDER — SODIUM CHLORIDE 0.9 % IV SOLN
Freq: Once | INTRAVENOUS | Status: AC
  Administered 2017-12-16: 16:00:00 via INTRAVENOUS

## 2017-12-16 NOTE — Progress Notes (Signed)
Hematology and Oncology Follow Up Visit  Natalie Hendrix [id number] 1951-10-28 67 y.o. 12/16/2017   Principle Diagnosis:  Polycythemia vera- JAK2 negative  Current Therapy:   Phlebotomy to maintain hematocrit less than 38% Plavix 75 mg by mouth daily   Interim History:  Natalie Hendrix is here today for follow-up and phlebotomy. Hct is 43.7%. She is symptomatic with fatigue and headaches.   She is on supplemental O2, 2L [place]. SOB with any exertion is unchanged.  She is staying busy helping her husband and this adds quite a bit to her fatigue.  No fever, chills, n/v, cough, rash, dizziness, chest pain, abdominal pain or changes in bowel or bladder habits.   No episodes of bleeding. No bruising or petechiae.  No lymphadenopathy found on exam.  No swelling, numbness or tingling in her extremities. She has generalized aches and pains due to osteoarthritis.  She has a good appetite and is doing her best to stay well hydrated. Her weight is stable.   ECOG Performance Status: 2 - Symptomatic, <50% confined to bed  Medications:  Allergies as of 12/16/2017      Reactions   Qvar [beclomethasone]    Spiriva [tiotropium Bromide Monohydrate]    rash   Chlorzoxazone    Epinephrine    Raises heart rate   Lamotrigine    Levofloxacin    Neurontin [gabapentin]    arthralgia   Protriptyline Hcl    Severe constipation   Ropinirole Hcl    arthralgia   Tizanidine    panic   Verapamil    Zonegran    Mood swings   Advair Diskus [fluticasone-salmeterol] Rash   Baclofen Rash   Chlorzoxazone Rash   Levofloxacin Rash   Protriptyline Rash   Ropinirole Rash      Medication List        Accurate as of 12/16/17  2:32 PM. Always use your most recent med list.          albuterol 108 (90 Base) MCG/ACT inhaler Commonly known as:  PROAIR HFA Inhale 2 puffs into the lungs every 6 (six) hours as needed for wheezing or shortness of breath.   albuterol (2.5 MG/3ML) 0.083% nebulizer solution Commonly  known as:  PROVENTIL Take 3 mLs (2.5 mg total) by nebulization every 6 (six) hours as needed for wheezing or shortness of breath.   azelastine 0.1 % nasal spray Commonly known as:  ASTELIN Place 1 spray into both nostrils 2 (two) times daily.   azithromycin 250 MG tablet Commonly known as:  ZITHROMAX Take 1 tablet (250 mg total) by mouth as directed.   beclomethasone 42 MCG/SPRAY nasal spray Commonly known as:  BECONASE-AQ Place 2 sprays into both nostrils 2 (two) times daily. Dose is for each nostril.   benzonatate 100 MG capsule Commonly known as:  TESSALON Take 1 capsule (100 mg total) by mouth every 6 (six) hours as needed for cough.   buPROPion 300 MG 24 hr tablet Commonly known as:  WELLBUTRIN XL Take 1 tablet (300 mg total) by mouth daily with breakfast.   CALCIUM 1200 PO Take 2,400 mg by mouth daily.   CENTRUM SILVER tablet Take 1 tablet by mouth daily.   clopidogrel 75 MG tablet Commonly known as:  PLAVIX Take 1 tablet (75 mg total) by mouth daily.   co-enzyme Q-10 30 MG capsule Take 30 mg by mouth daily.   cyclobenzaprine 5 MG tablet Commonly known as:  FLEXERIL Take 5 mg by mouth 2 (two) times daily as  needed for muscle spasms. Phillips   doxycycline 100 MG tablet Commonly known as:  VIBRA-TABS Take 2 tabs today then 1 daily   ESTRACE VAGINAL 0.1 MG/GM vaginal cream Generic drug:  estradiol Place 1 Applicatorful vaginally daily.   ezetimibe 10 MG tablet Commonly known as:  ZETIA TAKE 1 TABLET BY MOUTH DAILY.   fexofenadine 180 MG tablet Commonly known as:  ALLEGRA Take 1 tablet (180 mg total) by mouth daily as needed for allergies. As needed   Fish Oil 1200 MG Caps Take 1,200 mg by mouth at bedtime.   hyoscyamine 0.125 MG Tbdp disintergrating tablet Commonly known as:  ANASPAZ Place 1 tablet (0.125 mg total) under the tongue 2 (two) times daily as needed.   lidocaine 5 % Commonly known as:  LIDODERM Place 1 patch onto the skin as needed  (pain). Remove & Discard patch within 12 hours or as directed by MD   LORazepam 1 MG tablet Commonly known as:  ATIVAN Take 1 tablet (1 mg total) by mouth 2 (two) times daily. May take extra 3rd dose if needed   magic mouthwash w/lidocaine Soln Take 5 mLs by mouth 4 (four) times daily.   magnesium gluconate 500 MG tablet Commonly known as:  MAGONATE Take 500 mg by mouth daily as needed (constipation).   meclizine 25 MG tablet Commonly known as:  ANTIVERT Take 1 tablet (25 mg total) by mouth 3 (three) times daily as needed for dizziness. 3 month supply   metoprolol succinate 50 MG 24 hr tablet Commonly known as:  TOPROL-XL Take 1 tablet (50 mg total) by mouth daily with breakfast.   morphine 15 MG tablet Commonly known as:  MSIR Take 15 mg by mouth every 4 (four) hours as needed for severe pain.   morphine 30 MG 12 hr tablet Commonly known as:  MS CONTIN Take 30 mg by mouth every 12 (twelve) hours.   Nebulizer Devi Use as directed   neomycin-polymyxin-hydrocortisone 3.5-10000-1 ophthalmic suspension Commonly known as:  CORTISPORIN Place 2 drops into both eyes 3 (three) times daily.   OXYGEN Inhale 2.5 L/hr into the lungs continuous.   pantoprazole 40 MG tablet Commonly known as:  PROTONIX TAKE 1 TABLET (40 MG TOTAL) BY MOUTH DAILY.   pravastatin 40 MG tablet Commonly known as:  PRAVACHOL Take 1 tablet (40 mg total) by mouth at bedtime.   promethazine 25 MG tablet Commonly known as:  PHENERGAN Take 25 mg by mouth every 8 (eight) hours as needed for nausea or vomiting.   SYSTANE 0.4-0.3 % Soln Generic drug:  Polyethyl Glycol-Propyl Glycol Apply 1 drop to eye daily as needed (dry eyes).   TART CHERRY ADVANCED PO Take 5 mLs by mouth daily. Juice Concentrate- 1tsp daily   ZOVIRAX 5 % Generic drug:  acyclovir ointment Apply 1 application topically as needed (flair).       Allergies:  Allergies  Allergen Reactions  . Qvar [Beclomethasone]   . Spiriva  [Tiotropium Bromide Monohydrate]     rash  . Chlorzoxazone   . Epinephrine     Raises heart rate  . Lamotrigine   . Levofloxacin   . Neurontin [Gabapentin]     arthralgia  . Protriptyline Hcl     Severe constipation  . Ropinirole Hcl     arthralgia  . Tizanidine     panic  . Verapamil   . Zonegran     Mood swings  . Advair Diskus [Fluticasone-Salmeterol] Rash  . Baclofen Rash  . Chlorzoxazone Rash  .  Levofloxacin Rash  . Protriptyline Rash  . Ropinirole Rash    Past Medical History, Surgical history, Social history, and Family History were reviewed and updated.  Review of Systems: All other 10 point review of systems is negative.   Physical Exam:  vitals were not taken for this visit.   Wt Readings from Last 3 Encounters:  10/08/17 199 lb (90.3 kg)  09/05/17 192 lb (87.1 kg)  07/17/17 198 lb (89.8 kg)    Ocular: Sclerae unicteric, pupils equal, round and reactive to light Ear-nose-throat: Oropharynx clear, dentition fair Lymphatic: No cervical, supraclavicular or axillary adenopathy Lungs no rales or rhonchi, good excursion bilaterally Heart regular rate and rhythm, no murmur appreciated Abd soft, nontender, positive bowel sounds, no liver or spleen tip palpated on exam, no fluid wave  MSK no focal spinal tenderness, no joint edema Neuro: non-focal, well-oriented, appropriate affect Breasts: Deferred   Lab Results  Component Value Date   WBC 5.6 10/08/2017   HGB 11.5 (L) 10/08/2017   HCT 43.7 12/16/2017   MCV 78.3 (L) 12/16/2017   PLT 251 10/08/2017   Lab Results  Component Value Date   FERRITIN 6 (L) 10/08/2017   IRON 32 (L) 10/08/2017   TIBC 468 (H) 10/08/2017   UIBC 436 (H) 10/08/2017   IRONPCTSAT 7 (L) 10/08/2017   Lab Results  Component Value Date   RETICCTPCT 1.1 04/27/2015   RBC 5.58 (H) 12/16/2017   RETICCTABS 55.1 04/27/2015   No results found for: KPAFRELGTCHN, LAMBDASER, KAPLAMBRATIO No results found for: IGGSERUM, IGA, IGMSERUM No  results found for: Kathrynn Ducking, MSPIKE, SPEI   Chemistry      Component Value Date/Time   NA 147 (H) 10/08/2017 1410   NA 141 02/17/2017 1132   K 4.6 10/08/2017 1410   K 5.3 (H) 02/17/2017 1132   CL 106 10/08/2017 1410   CO2 30 10/08/2017 1410   CO2 29 02/17/2017 1132   BUN 15 10/08/2017 1410   BUN 17.8 02/17/2017 1132   CREATININE 1.0 10/08/2017 1410   CREATININE 0.8 02/17/2017 1132      Component Value Date/Time   CALCIUM 9.0 10/08/2017 1410   CALCIUM 9.3 02/17/2017 1132   ALKPHOS 59 10/08/2017 1410   ALKPHOS 79 02/17/2017 1132   AST 32 10/08/2017 1410   AST 22 02/17/2017 1132   ALT 26 10/08/2017 1410   ALT 20 02/17/2017 1132   BILITOT 0.50 10/08/2017 1410   BILITOT 0.39 02/17/2017 1132      Impression and Plan: Natalie Hendrix is a very pleasant 67 yo caucasian female with polycythemia. She is symptomatic with fatigue and headache. Hct is 43.7 so we will phlebotomize her today and weekly for the next 3 weeks. She is in agreement with this.  We will then see her back in another 4 weeks for follow-up with lab and phlebotomy if needed.  She promises to contact our office with any questions or concers. We can certainly see her sooner if need be.   [name], NP 1/15/20192:32 PM

## 2017-12-16 NOTE — Patient Instructions (Signed)

## 2017-12-16 NOTE — Progress Notes (Signed)
Natalie Hendrix presents today for phlebotomy per MD orders. Phlebotomy procedure started at 1555 and ended at 1606 via 22 G needle at L forearm. 250 cc removed. Patient tolerated procedure well. 250 cc fluids were given via same IV per Sheryn Bison, NP. IV needle removed intact.

## 2017-12-17 LAB — FERRITIN: Ferritin: 9 ng/mL (ref 9–269)

## 2017-12-17 LAB — IRON AND TIBC
Iron: 73 ug/dL (ref 41–142)
Saturation Ratios: 18 % — ABNORMAL LOW (ref 21–57)
TIBC: 411 ug/dL (ref 236–444)
UIBC: 338 ug/dL

## 2017-12-23 ENCOUNTER — Inpatient Hospital Stay: Payer: Medicare Other

## 2017-12-23 ENCOUNTER — Other Ambulatory Visit: Payer: Self-pay

## 2017-12-23 VITALS — BP 107/49 | HR 74 | Temp 98.6°F | Resp 16

## 2017-12-23 DIAGNOSIS — D45 Polycythemia vera: Secondary | ICD-10-CM

## 2017-12-23 MED ORDER — SODIUM CHLORIDE 0.9 % IV SOLN
Freq: Once | INTRAVENOUS | Status: AC
  Administered 2017-12-23: 15:00:00 via INTRAVENOUS

## 2017-12-23 NOTE — Progress Notes (Deleted)
Natalie Hendrix presents today for phlebotomy per MD orders. Phlebotomy procedure started at 1430 and ended at 1450 via 20 g cath to right upper posterior forearm. 503 grams removed. Patient observed for 30 minutes after procedure without any incident.   Patient tolerated procedure well. IV needle removed intact.  Snack and drink given.  Pressure dressing clean, dry and intact to right ac at time of discharge.

## 2017-12-23 NOTE — Patient Instructions (Signed)

## 2017-12-23 NOTE — Progress Notes (Signed)
Natalie Hendrix presents today for phlebotomy per MD orders. Phlebotomy procedure started at 1428 and ended at 1445. 503 grams removed using 20g IV cath. 500 ml NS given over 30 minutes per order of S. [place] NP.  Patient observed for 30 minutes after procedure without any incident. Patient tolerated procedure well. IV fluids replaced.  IV needle removed intact.  Snack and drink given prior to discharge.

## 2018-01-02 ENCOUNTER — Inpatient Hospital Stay: Payer: Medicare Other | Attending: Hematology & Oncology

## 2018-01-02 VITALS — BP 107/50 | HR 75 | Temp 98.6°F | Resp 20

## 2018-01-02 DIAGNOSIS — D45 Polycythemia vera: Secondary | ICD-10-CM | POA: Insufficient documentation

## 2018-01-02 MED ORDER — SODIUM CHLORIDE 0.9 % IV SOLN
Freq: Once | INTRAVENOUS | Status: AC
  Administered 2018-01-02: 12:00:00 via INTRAVENOUS

## 2018-01-02 NOTE — Patient Instructions (Signed)
Dehydration, Adult Dehydration is when there is not enough fluid or water in your body. This happens when you lose more fluids than you take in. Dehydration can range from mild to very bad. It should be treated right away to keep it from getting very bad. Symptoms of mild dehydration may include:  Thirst.  Dry lips.  Slightly dry mouth.  Dry, warm skin.  Dizziness. Symptoms of moderate dehydration may include:  Very dry mouth.  Muscle cramps.  Dark pee (urine). Pee may be the color of tea.  Your body making less pee.  Your eyes making fewer tears.  Heartbeat that is uneven or faster than normal (palpitations).  Headache.  Light-headedness, especially when you stand up from sitting.  Fainting (syncope). Symptoms of very bad dehydration may include:  Changes in skin, such as: ? Cold and clammy skin. ? Blotchy (mottled) or pale skin. ? Skin that does not quickly return to normal after being lightly pinched and let go (poor skin turgor).  Changes in body fluids, such as: ? Feeling very thirsty. ? Your eyes making fewer tears. ? Not sweating when body temperature is high, such as in hot weather. ? Your body making very little pee.  Changes in vital signs, such as: ? Weak pulse. ? Pulse that is more than 100 beats a minute when you are sitting still. ? Fast breathing. ? Low blood pressure.  Other changes, such as: ? Sunken eyes. ? Cold hands and feet. ? Confusion. ? Lack of energy (lethargy). ? Trouble waking up from sleep. ? Short-term weight loss. ? Unconsciousness. Follow these instructions at home:  If told by your doctor, drink an ORS: ? Make an ORS by using instructions on the package. ? Start by drinking small amounts, about  cup (120 mL) every 5-10 minutes. ? Slowly drink more until you have had the amount that your doctor said to have.  Drink enough clear fluid to keep your pee clear or pale yellow. If you were told to drink an ORS, finish the ORS  first, then start slowly drinking clear fluids. Drink fluids such as: ? Water. Do not drink only water by itself. Doing that can make the salt (sodium) level in your body get too low (hyponatremia). ? Ice chips. ? Fruit juice that you have added water to (diluted). ? Low-calorie sports drinks.  Avoid: ? Alcohol. ? Drinks that have a lot of sugar. These include high-calorie sports drinks, fruit juice that does not have water added, and soda. ? Caffeine. ? Foods that are greasy or have a lot of fat or sugar.  Take over-the-counter and prescription medicines only as told by your doctor.  Do not take salt tablets. Doing that can make the salt level in your body get too high (hypernatremia).  Eat foods that have minerals (electrolytes). Examples include bananas, oranges, potatoes, tomatoes, and spinach.  Keep all follow-up visits as told by your doctor. This is important. Contact a doctor if:  You have belly (abdominal) pain that: ? Gets worse. ? Stays in one area (localizes).  You have a rash.  You have a stiff neck.  You get angry or annoyed more easily than normal (irritability).  You are more sleepy than normal.  You have a harder time waking up than normal.  You feel: ? Weak. ? Dizzy. ? Very thirsty.  You have peed (urinated) only a small amount of very dark pee during 6-8 hours. Get help right away if:  You have symptoms of   very bad dehydration.  You cannot drink fluids without throwing up (vomiting).  Your symptoms get worse with treatment.  You have a fever.  You have a very bad headache.  You are throwing up or having watery poop (diarrhea) and it: ? Gets worse. ? Does not go away.  You have blood or something green (bile) in your throw-up.  You have blood in your poop (stool). This may cause poop to look black and tarry.  You have not peed in 6-8 hours.  You pass out (faint).  Your heart rate when you are sitting still is more than 100 beats a  minute.  You have trouble breathing. This information is not intended to replace advice given to you by your health care provider. Make sure you discuss any questions you have with your health care provider. Document Released: 09/14/2009 Document Revised: 06/07/2016 Document Reviewed: 01/12/2016 Elsevier Interactive Patient Education  2018 [employer]  

## 2018-01-02 NOTE — Progress Notes (Signed)
Natalie Hendrix presents today for phlebotomy per MD orders. Phlebotomy procedure started at 1150 and ended at 1200 via 20 g angio cath to right upper posterior forearm without difficulty. 266 grams removed and 250 ml.'s of NS replaced per order of S. [place] NP.   Patient observed for 30 minutes after procedure without any incident. Patient tolerated procedure well. IV needle removed intact.  Pressure dressing clean, dry and intact at time of discharge.

## 2018-01-05 ENCOUNTER — Other Ambulatory Visit: Payer: Self-pay | Admitting: *Deleted

## 2018-01-05 DIAGNOSIS — D45 Polycythemia vera: Secondary | ICD-10-CM

## 2018-01-06 ENCOUNTER — Inpatient Hospital Stay: Payer: Medicare Other

## 2018-01-06 ENCOUNTER — Other Ambulatory Visit: Payer: Self-pay | Admitting: Family

## 2018-01-06 DIAGNOSIS — D45 Polycythemia vera: Secondary | ICD-10-CM

## 2018-01-06 LAB — CBC WITH DIFFERENTIAL (CANCER CENTER ONLY)
BASOS ABS: 0 10*3/uL (ref 0.0–0.1)
BASOS PCT: 0 %
EOS ABS: 0.1 10*3/uL (ref 0.0–0.5)
Eosinophils Relative: 2 %
HEMATOCRIT: 34.7 % — AB (ref 34.8–46.6)
HEMOGLOBIN: 11 g/dL — AB (ref 11.6–15.9)
Lymphocytes Relative: 26 %
Lymphs Abs: 1.2 10*3/uL (ref 0.9–3.3)
MCH: 25.6 pg — ABNORMAL LOW (ref 26.0–34.0)
MCHC: 31.7 g/dL — AB (ref 32.0–36.0)
MCV: 80.9 fL — ABNORMAL LOW (ref 81.0–101.0)
MONO ABS: 0.5 10*3/uL (ref 0.1–0.9)
Monocytes Relative: 11 %
NEUTROS ABS: 2.8 10*3/uL (ref 1.5–6.5)
NEUTROS PCT: 61 %
Platelet Count: 232 10*3/uL (ref 145–400)
RBC: 4.29 MIL/uL (ref 3.70–5.32)
RDW: 16.9 % — AB (ref 11.1–15.7)
WBC Count: 4.6 10*3/uL (ref 3.9–10.0)

## 2018-01-06 LAB — SAVE SMEAR

## 2018-01-06 LAB — CMP (CANCER CENTER ONLY)
ALT: 18 U/L (ref 0–55)
ANION GAP: 4 — AB (ref 5–15)
AST: 24 U/L (ref 5–34)
Albumin: 3.2 g/dL — ABNORMAL LOW (ref 3.5–5.0)
Alkaline Phosphatase: 61 U/L (ref 26–84)
BILIRUBIN TOTAL: 0.5 mg/dL (ref 0.2–1.2)
BUN: 13 mg/dL (ref 7–22)
CALCIUM: 8.7 mg/dL (ref 8.0–10.3)
CO2: 28 mmol/L (ref 18–33)
Chloride: 110 mmol/L — ABNORMAL HIGH (ref 98–108)
Creatinine: 0.7 mg/dL (ref 0.60–1.10)
Glucose, Bld: 112 mg/dL (ref 73–118)
Potassium: 4.3 mmol/L (ref 3.3–4.7)
Sodium: 142 mmol/L (ref 128–145)
Total Protein: 6 g/dL — ABNORMAL LOW (ref 6.4–8.1)

## 2018-01-07 LAB — IRON AND TIBC
IRON: 36 ug/dL — AB (ref 41–142)
SATURATION RATIOS: 9 % — AB (ref 21–57)
TIBC: 388 ug/dL (ref 236–444)
UIBC: 352 ug/dL

## 2018-01-07 LAB — FERRITIN: Ferritin: 4 ng/mL — ABNORMAL LOW (ref 9–269)

## 2018-01-09 ENCOUNTER — Encounter: Payer: Self-pay | Admitting: Family

## 2018-01-13 ENCOUNTER — Ambulatory Visit: Payer: Medicare Other | Admitting: Family

## 2018-01-13 ENCOUNTER — Other Ambulatory Visit: Payer: Medicare Other

## 2018-01-16 ENCOUNTER — Other Ambulatory Visit: Payer: Medicare Other

## 2018-01-16 ENCOUNTER — Ambulatory Visit: Payer: Medicare Other | Admitting: Family

## 2018-02-10 ENCOUNTER — Ambulatory Visit: Payer: Medicare Other | Admitting: Family

## 2018-02-10 ENCOUNTER — Other Ambulatory Visit: Payer: Medicare Other

## 2018-02-18 ENCOUNTER — Other Ambulatory Visit: Payer: Self-pay

## 2018-02-18 MED ORDER — OSELTAMIVIR PHOSPHATE 75 MG PO CAPS: 75.0000 mg | ORAL_CAPSULE | Freq: Two times a day (BID) | ORAL | 0 refills | Status: DC

## 2018-02-18 NOTE — Progress Notes (Signed)
tsa

## 2018-02-27 ENCOUNTER — Inpatient Hospital Stay: Payer: Medicare Other

## 2018-02-27 ENCOUNTER — Inpatient Hospital Stay: Payer: Medicare Other | Admitting: Family

## 2018-03-10 ENCOUNTER — Inpatient Hospital Stay: Payer: Medicare Other

## 2018-03-10 ENCOUNTER — Ambulatory Visit (HOSPITAL_BASED_OUTPATIENT_CLINIC_OR_DEPARTMENT_OTHER)
Admission: RE | Admit: 2018-03-10 | Discharge: 2018-03-10 | Disposition: A | Discharge status: Home / Self Care | Payer: Medicare Other | Source: Ambulatory Visit | Attending: Family | Admitting: Family

## 2018-03-10 ENCOUNTER — Other Ambulatory Visit: Payer: Self-pay

## 2018-03-10 ENCOUNTER — Inpatient Hospital Stay: Payer: Medicare Other | Attending: Hematology & Oncology

## 2018-03-10 ENCOUNTER — Inpatient Hospital Stay (HOSPITAL_BASED_OUTPATIENT_CLINIC_OR_DEPARTMENT_OTHER): Payer: Medicare Other | Admitting: Family

## 2018-03-10 VITALS — BP 103/71 | HR 77 | Temp 98.3°F | Resp 20 | Wt 206.0 lb

## 2018-03-10 VITALS — BP 117/63 | HR 73 | Resp 20

## 2018-03-10 DIAGNOSIS — W19XXXA Unspecified fall, initial encounter: Secondary | ICD-10-CM

## 2018-03-10 DIAGNOSIS — D45 Polycythemia vera: Secondary | ICD-10-CM

## 2018-03-10 DIAGNOSIS — D500 Iron deficiency anemia secondary to blood loss (chronic): Secondary | ICD-10-CM

## 2018-03-10 DIAGNOSIS — M25471 Effusion, right ankle: Secondary | ICD-10-CM

## 2018-03-10 LAB — CMP (CANCER CENTER ONLY)
ALK PHOS: 71 U/L (ref 26–84)
ALT: 27 U/L (ref 10–47)
AST: 30 U/L (ref 11–38)
Albumin: 3.7 g/dL (ref 3.5–5.0)
Anion gap: 6 (ref 5–15)
BUN: 17 mg/dL (ref 7–22)
CHLORIDE: 105 mmol/L (ref 98–108)
CO2: 30 mmol/L (ref 18–33)
Calcium: 9.2 mg/dL (ref 8.0–10.3)
Creatinine: 0.8 mg/dL (ref 0.60–1.20)
Glucose, Bld: 105 mg/dL (ref 73–118)
POTASSIUM: 4.4 mmol/L (ref 3.3–4.7)
SODIUM: 141 mmol/L (ref 128–145)
Total Bilirubin: 0.6 mg/dL (ref 0.2–1.6)
Total Protein: 6.8 g/dL (ref 6.4–8.1)

## 2018-03-10 LAB — CBC WITH DIFFERENTIAL (CANCER CENTER ONLY)
Basophils Absolute: 0 10*3/uL (ref 0.0–0.1)
Basophils Relative: 1 %
EOS PCT: 3 %
Eosinophils Absolute: 0.2 10*3/uL (ref 0.0–0.5)
HCT: 39.9 % (ref 34.8–46.6)
HEMOGLOBIN: 12.8 g/dL (ref 11.6–15.9)
LYMPHS ABS: 1.7 10*3/uL (ref 0.9–3.3)
Lymphocytes Relative: 27 %
MCH: 25.1 pg — AB (ref 26.0–34.0)
MCHC: 32.1 g/dL (ref 32.0–36.0)
MCV: 78.4 fL — ABNORMAL LOW (ref 81.0–101.0)
MONOS PCT: 12 %
Monocytes Absolute: 0.7 10*3/uL (ref 0.1–0.9)
Neutro Abs: 3.5 10*3/uL (ref 1.5–6.5)
Neutrophils Relative %: 57 %
PLATELETS: 275 10*3/uL (ref 145–400)
RBC: 5.09 MIL/uL (ref 3.70–5.32)
RDW: 14.7 % (ref 11.1–15.7)
WBC Count: 6.1 10*3/uL (ref 3.9–10.0)

## 2018-03-10 MED ORDER — SODIUM CHLORIDE 0.9 % IV SOLN
Freq: Once | INTRAVENOUS | Status: AC
  Administered 2018-03-10: 17:00:00 via INTRAVENOUS

## 2018-03-10 NOTE — Progress Notes (Signed)
Hematology and Oncology Follow Up Visit  Natalie Hendrix [id number] [birth year] 67 y.o. 03/10/2018   Principle Diagnosis:  Polycythemia vera- JAK2 negative  Current Therapy:   Phlebotomy to maintain hematocrit less than 38% Plavix 75 mg by mouth daily   Interim History:  Natalie Hendrix is here today for follow-up. She is symptomatic with fatigued, heavy sweats, palpitations, dizziness, increased SOB and blurred vision. Hct is 39.9%.  She is on 2L [place] supplemental O2.  She has had no fever, chills, n/v, cough, rash, chest pain, abdominal pain or changes in bowel or bladder habits.  She states that she had a fall a week and a half ago and has bruising of the right shin and ankle. She has swelling around the right ankle as well. She is wearing a boot for support and is able to place weight on the leg.   No episodes of bleeding.  No lymphadenopathy noted on exam.  No numbness or tingling in her extremities at this time.  Her appetite has been down but she is trying to stay well hydrated. She is supplementing with protein shakes. Her weight is stable.   ECOG Performance Status: 2 - Symptomatic, <50% confined to bed  Medications:  Allergies as of 03/10/2018      Reactions   Qvar [beclomethasone]    Spiriva [tiotropium Bromide Monohydrate]    rash   Chlorzoxazone    Epinephrine    Raises heart rate   Lamotrigine    Levofloxacin    Neurontin [gabapentin]    arthralgia   Protriptyline Hcl    Severe constipation   Ropinirole Hcl    arthralgia   Tizanidine    panic   Verapamil    Zonegran    Mood swings   Advair Diskus [fluticasone-salmeterol] Rash   Baclofen Rash   Chlorzoxazone Rash   Levofloxacin Rash   Protriptyline Rash   Ropinirole Rash      Medication List        Accurate as of 03/10/18  4:28 PM. Always use your most recent med list.          albuterol 108 (90 Base) MCG/ACT inhaler Commonly known as:  PROAIR HFA Inhale 2 puffs into the lungs every 6 (six) hours as  needed for wheezing or shortness of breath.   albuterol (2.5 MG/3ML) 0.083% nebulizer solution Commonly known as:  PROVENTIL Take 3 mLs (2.5 mg total) by nebulization every 6 (six) hours as needed for wheezing or shortness of breath.   azelastine 0.1 % nasal spray Commonly known as:  ASTELIN Place 1 spray into both nostrils 2 (two) times daily.   beclomethasone 42 MCG/SPRAY nasal spray Commonly known as:  BECONASE-AQ Place 2 sprays into both nostrils 2 (two) times daily. Dose is for each nostril.   benzonatate 100 MG capsule Commonly known as:  TESSALON Take 1 capsule (100 mg total) by mouth every 6 (six) hours as needed for cough.   buPROPion 300 MG 24 hr tablet Commonly known as:  WELLBUTRIN XL Take 1 tablet (300 mg total) by mouth daily with breakfast.   CALCIUM 1200 PO Take 2,400 mg by mouth daily.   CENTRUM SILVER tablet Take 1 tablet by mouth daily.   clopidogrel 75 MG tablet Commonly known as:  PLAVIX Take 1 tablet (75 mg total) by mouth daily.   co-enzyme Q-10 30 MG capsule Take 30 mg by mouth daily.   cyclobenzaprine 5 MG tablet Commonly known as:  FLEXERIL Take 5 mg by mouth 2 (  two) times daily as needed for muscle spasms. Phillips   ESTRACE VAGINAL 0.1 MG/GM vaginal cream Generic drug:  estradiol Place 1 Applicatorful vaginally daily. Small amount each dose per pt   ezetimibe 10 MG tablet Commonly known as:  ZETIA TAKE 1 TABLET BY MOUTH DAILY.   fexofenadine 180 MG tablet Commonly known as:  ALLEGRA Take 1 tablet (180 mg total) by mouth daily as needed for allergies. As needed   Fish Oil 1200 MG Caps Take 1,200 mg by mouth at bedtime.   hyoscyamine 0.125 MG Tbdp disintergrating tablet Commonly known as:  ANASPAZ Place 1 tablet (0.125 mg total) under the tongue 2 (two) times daily as needed.   lidocaine 5 % Commonly known as:  LIDODERM Place 1 patch onto the skin as needed (pain). Remove & Discard patch within 12 hours or as directed by MD     LORazepam 1 MG tablet Commonly known as:  ATIVAN Take 1 tablet (1 mg total) by mouth 2 (two) times daily. May take extra 3rd dose if needed   magic mouthwash w/lidocaine Soln Take 5 mLs by mouth 4 (four) times daily.   magnesium gluconate 500 MG tablet Commonly known as:  MAGONATE Take 500 mg by mouth daily as needed (constipation).   meclizine 25 MG tablet Commonly known as:  ANTIVERT Take 1 tablet (25 mg total) by mouth 3 (three) times daily as needed for dizziness. 3 month supply   metoprolol succinate 50 MG 24 hr tablet Commonly known as:  TOPROL-XL Take 1 tablet (50 mg total) by mouth daily with breakfast.   morphine 15 MG tablet Commonly known as:  MSIR Take 15 mg by mouth every 4 (four) hours as needed for severe pain.   morphine 30 MG 12 hr tablet Commonly known as:  MS CONTIN Take 30 mg by mouth every 12 (twelve) hours.   Nebulizer Devi Use as directed   OXYGEN Inhale 2.5 L/hr into the lungs continuous.   pantoprazole 40 MG tablet Commonly known as:  PROTONIX TAKE 1 TABLET (40 MG TOTAL) BY MOUTH DAILY.   pravastatin 40 MG tablet Commonly known as:  PRAVACHOL Take 1 tablet (40 mg total) by mouth at bedtime.   promethazine 25 MG tablet Commonly known as:  PHENERGAN Take 25 mg by mouth every 8 (eight) hours as needed for nausea or vomiting.   SYSTANE 0.4-0.3 % Soln Generic drug:  Polyethyl Glycol-Propyl Glycol Apply 1 drop to eye daily as needed (dry eyes).   TART CHERRY ADVANCED PO Take 5 mLs by mouth daily. Juice Concentrate- 1tsp daily   ZOVIRAX 5 % Generic drug:  acyclovir ointment Apply 1 application topically as needed (flair).       Allergies:  Allergies  Allergen Reactions  . Qvar [Beclomethasone]   . Spiriva [Tiotropium Bromide Monohydrate]     rash  . Chlorzoxazone   . Epinephrine     Raises heart rate  . Lamotrigine   . Levofloxacin   . Neurontin [Gabapentin]     arthralgia  . Protriptyline Hcl     Severe constipation  .  Ropinirole Hcl     arthralgia  . Tizanidine     panic  . Verapamil   . Zonegran     Mood swings  . Advair Diskus [Fluticasone-Salmeterol] Rash  . Baclofen Rash  . Chlorzoxazone Rash  . Levofloxacin Rash  . Protriptyline Rash  . Ropinirole Rash    Past Medical History, Surgical history, Social history, and Family History were reviewed and  updated.  Review of Systems: All other 10 point review of systems is negative.   Physical Exam:  weight is 206 lb (93.4 kg). Her oral temperature is 98.3 F (36.8 C). Her blood pressure is 103/71 and her pulse is 77. Her respiration is 20 and oxygen saturation is 96%.   Wt Readings from Last 3 Encounters:  03/10/18 206 lb (93.4 kg)  12/16/17 200 lb (90.7 kg)  10/08/17 199 lb (90.3 kg)    Ocular: Sclerae unicteric, pupils equal, round and reactive to light Ear-nose-throat: Oropharynx clear, dentition fair Lymphatic: No cervical, supraclavicular or axillary adenopathy Lungs no rales or rhonchi, good excursion bilaterally Heart regular rate and rhythm, no murmur appreciated Abd soft, nontender, positive bowel sounds, no liver or spleen tip palpated on exam, no fluid wave  MSK no focal spinal tenderness, no joint edema Neuro: non-focal, well-oriented, appropriate affect Breasts: Deferred   Lab Results  Component Value Date   WBC 6.1 03/10/2018   HGB 11.5 (L) 10/08/2017   HCT 39.9 03/10/2018   MCV 78.4 (L) 03/10/2018   PLT 275 03/10/2018   Lab Results  Component Value Date   FERRITIN 4 (L) 01/06/2018   IRON 36 (L) 01/06/2018   TIBC 388 01/06/2018   UIBC 352 01/06/2018   IRONPCTSAT 9 (L) 01/06/2018   Lab Results  Component Value Date   RETICCTPCT 1.1 04/27/2015   RBC 5.09 03/10/2018   RETICCTABS 55.1 04/27/2015   No results found for: KPAFRELGTCHN, LAMBDASER, KAPLAMBRATIO No results found for: IGGSERUM, IGA, IGMSERUM No results found for: Odetta Pink, SPEI   Chemistry        Component Value Date/Time   NA 141 03/10/2018 1519   NA 147 (H) 10/08/2017 1410   NA 141 02/17/2017 1132   K 4.4 03/10/2018 1519   K 4.6 10/08/2017 1410   K 5.3 (H) 02/17/2017 1132   CL 105 03/10/2018 1519   CL 106 10/08/2017 1410   CO2 30 03/10/2018 1519   CO2 30 10/08/2017 1410   CO2 29 02/17/2017 1132   BUN 17 03/10/2018 1519   BUN 15 10/08/2017 1410   BUN 17.8 02/17/2017 1132   CREATININE 0.80 03/10/2018 1519   CREATININE 1.0 10/08/2017 1410   CREATININE 0.8 02/17/2017 1132      Component Value Date/Time   CALCIUM 9.2 03/10/2018 1519   CALCIUM 9.0 10/08/2017 1410   CALCIUM 9.3 02/17/2017 1132   ALKPHOS 71 03/10/2018 1519   ALKPHOS 59 10/08/2017 1410   ALKPHOS 79 02/17/2017 1132   AST 30 03/10/2018 1519   AST 22 02/17/2017 1132   ALT 27 03/10/2018 1519   ALT 26 10/08/2017 1410   ALT 20 02/17/2017 1132   BILITOT 0.6 03/10/2018 1519   BILITOT 0.39 02/17/2017 1132      Impression and Plan: Natalie Hendrix is a very pleasant 67 yo caucasian female with polycythemia. She is quite symptomatic, as mentioned above, with Hct of 39.9%.  We will phlebotomize her today and again in 2 weeks.  We will get an x-ray to better evaluate her right lower extremity for injury with fall.  We will go ahead and plan to see her back in another 4 weeks for follow-up.  She will contact our office with any questions or concerns. We can certainly see her sooner if need be.   [name], NP 4/9/20194:28 PM

## 2018-03-10 NOTE — Patient Instructions (Signed)

## 2018-03-10 NOTE — Progress Notes (Signed)
Natalie Hendrix presents today for phlebotomy per MD orders. Phlebotomy procedure started at 1615 and ended at 1630. 506 ml cc removed via 20 G needle at R antecubital site. IV fluids replacement given through the same IV. Patient tolerated procedure well. IV needle removed intact.

## 2018-03-11 ENCOUNTER — Telehealth: Payer: Self-pay | Admitting: *Deleted

## 2018-03-11 LAB — IRON AND TIBC
IRON: 49 ug/dL (ref 41–142)
SATURATION RATIOS: 11 % — AB (ref 21–57)
TIBC: 455 ug/dL — AB (ref 236–444)
UIBC: 406 ug/dL

## 2018-03-11 LAB — FERRITIN: FERRITIN: 9 ng/mL (ref 9–269)

## 2018-03-11 NOTE — Telephone Encounter (Addendum)
Pt notified per order of S. [place] NP that xray shows no evidence of break or fracture.  Pt appreciative of call and has no questions at this time.    ----- Message from Eliezer Bottom, NP sent at 03/11/2018 10:30 AM EDT ----- No evidence of break or fracture. Thank you!  Sarah  ----- Message ----- From: Buel Ream, Rad Results In Sent: 03/11/2018   9:56 AM To: Eliezer Bottom, NP

## 2018-03-24 ENCOUNTER — Inpatient Hospital Stay: Payer: Medicare Other

## 2018-03-26 ENCOUNTER — Other Ambulatory Visit: Payer: Self-pay | Admitting: Family

## 2018-03-26 ENCOUNTER — Other Ambulatory Visit: Payer: Self-pay | Admitting: *Deleted

## 2018-03-27 ENCOUNTER — Inpatient Hospital Stay: Payer: Medicare Other

## 2018-03-27 DIAGNOSIS — D45 Polycythemia vera: Secondary | ICD-10-CM

## 2018-03-27 DIAGNOSIS — D500 Iron deficiency anemia secondary to blood loss (chronic): Secondary | ICD-10-CM

## 2018-03-27 LAB — CBC WITH DIFFERENTIAL (CANCER CENTER ONLY)
BASOS ABS: 0 10*3/uL (ref 0.0–0.1)
Basophils Relative: 0 %
EOS PCT: 2 %
Eosinophils Absolute: 0.1 10*3/uL (ref 0.0–0.5)
HCT: 35.6 % (ref 34.8–46.6)
HEMOGLOBIN: 11 g/dL — AB (ref 11.6–15.9)
LYMPHS PCT: 25 %
Lymphs Abs: 1.4 10*3/uL (ref 0.9–3.3)
MCH: 24.3 pg — ABNORMAL LOW (ref 26.0–34.0)
MCHC: 30.9 g/dL — ABNORMAL LOW (ref 32.0–36.0)
MCV: 78.6 fL — AB (ref 81.0–101.0)
Monocytes Absolute: 0.7 10*3/uL (ref 0.1–0.9)
Monocytes Relative: 12 %
NEUTROS ABS: 3.5 10*3/uL (ref 1.5–6.5)
Neutrophils Relative %: 61 %
PLATELETS: 261 10*3/uL (ref 145–400)
RBC: 4.53 MIL/uL (ref 3.70–5.32)
RDW: 14.3 % (ref 11.1–15.7)
WBC Count: 5.7 10*3/uL (ref 3.9–10.0)

## 2018-03-27 LAB — CMP (CANCER CENTER ONLY)
ALK PHOS: 69 U/L (ref 40–150)
ALT: 18 U/L (ref 0–55)
AST: 28 U/L (ref 5–34)
Albumin: 3.8 g/dL (ref 3.5–5.0)
Anion gap: 7 (ref 3–11)
BUN: 20 mg/dL (ref 7–26)
CHLORIDE: 108 mmol/L (ref 98–109)
CO2: 25 mmol/L (ref 22–29)
CREATININE: 0.86 mg/dL (ref 0.60–1.10)
Calcium: 9.3 mg/dL (ref 8.4–10.4)
GFR, Est AFR Am: >60 mL/min (ref >60)
Glucose, Bld: 96 mg/dL (ref 70–140)
Potassium: 4.7 mmol/L (ref 3.5–5.1)
SODIUM: 140 mmol/L (ref 136–145)
Total Bilirubin: 0.3 mg/dL (ref 0.2–1.2)
Total Protein: 6.6 g/dL (ref 6.4–8.3)

## 2018-03-30 LAB — IRON AND TIBC
IRON: 32 ug/dL — AB (ref 41–142)
Saturation Ratios: 7 % — ABNORMAL LOW (ref 21–57)
TIBC: 454 ug/dL — ABNORMAL HIGH (ref 236–444)
UIBC: 422 ug/dL

## 2018-03-30 LAB — FERRITIN: FERRITIN: 4 ng/mL — AB (ref 9–269)

## 2018-04-06 ENCOUNTER — Other Ambulatory Visit: Payer: Self-pay | Admitting: Family

## 2018-04-06 DIAGNOSIS — D45 Polycythemia vera: Secondary | ICD-10-CM

## 2018-04-07 ENCOUNTER — Inpatient Hospital Stay: Payer: Medicare Other

## 2018-04-07 ENCOUNTER — Inpatient Hospital Stay: Payer: Medicare Other | Attending: Hematology & Oncology | Admitting: Family

## 2018-04-07 VITALS — BP 112/66 | HR 74 | Temp 98.4°F | Resp 18 | Wt 203.0 lb

## 2018-04-07 DIAGNOSIS — G43909 Migraine, unspecified, not intractable, without status migrainosus: Secondary | ICD-10-CM | POA: Insufficient documentation

## 2018-04-07 DIAGNOSIS — D500 Iron deficiency anemia secondary to blood loss (chronic): Secondary | ICD-10-CM

## 2018-04-07 DIAGNOSIS — Z7902 Long term (current) use of antithrombotics/antiplatelets: Secondary | ICD-10-CM | POA: Diagnosis not present

## 2018-04-07 DIAGNOSIS — D45 Polycythemia vera: Secondary | ICD-10-CM | POA: Insufficient documentation

## 2018-04-07 LAB — CBC WITH DIFFERENTIAL (CANCER CENTER ONLY)
BASOS ABS: 0 10*3/uL (ref 0.0–0.1)
BASOS PCT: 1 %
EOS ABS: 0.1 10*3/uL (ref 0.0–0.5)
Eosinophils Relative: 2 %
HEMATOCRIT: 36.6 % (ref 34.8–46.6)
HEMOGLOBIN: 11.3 g/dL — AB (ref 11.6–15.9)
LYMPHS ABS: 1.4 10*3/uL (ref 0.9–3.3)
LYMPHS PCT: 24 %
MCH: 23.9 pg — ABNORMAL LOW (ref 26.0–34.0)
MCHC: 30.9 g/dL — AB (ref 32.0–36.0)
MCV: 77.5 fL — ABNORMAL LOW (ref 81.0–101.0)
Monocytes Absolute: 0.6 10*3/uL (ref 0.1–0.9)
Monocytes Relative: 10 %
NEUTROS ABS: 3.7 10*3/uL (ref 1.5–6.5)
NEUTROS PCT: 63 %
Platelet Count: 262 10*3/uL (ref 145–400)
RBC: 4.72 MIL/uL (ref 3.70–5.32)
RDW: 14.7 % (ref 11.1–15.7)
WBC Count: 5.9 10*3/uL (ref 3.9–10.0)

## 2018-04-07 LAB — CMP (CANCER CENTER ONLY)
ALBUMIN: 3.6 g/dL (ref 3.5–5.0)
ALT: 25 U/L (ref 10–47)
AST: 27 U/L (ref 11–38)
Alkaline Phosphatase: 61 U/L (ref 26–84)
Anion gap: 5 (ref 5–15)
BILIRUBIN TOTAL: 0.7 mg/dL (ref 0.2–1.6)
BUN: 13 mg/dL (ref 7–22)
CHLORIDE: 108 mmol/L (ref 98–108)
CO2: 29 mmol/L (ref 18–33)
CREATININE: 0.8 mg/dL (ref 0.60–1.20)
Calcium: 9.8 mg/dL (ref 8.0–10.3)
Glucose, Bld: 94 mg/dL (ref 73–118)
Potassium: 4.8 mmol/L — ABNORMAL HIGH (ref 3.3–4.7)
Sodium: 142 mmol/L (ref 128–145)
Total Protein: 6.6 g/dL (ref 6.4–8.1)

## 2018-04-07 NOTE — Progress Notes (Signed)
Hematology and Oncology Follow Up Visit  Natalie Hendrix [id number] 07/21/1951 67 y.o. 04/07/2018   Principle Diagnosis:  Polycythemia vera- JAK2 negative  Current Therapy:   Phlebotomy to maintain hematocrit less than 38% Plavix 75 mg by mouth daily   Interim History:  Natalie Hendrix is here today with her husband for follow-up. She is feeling a little better today. She is still having migraines and would like to be referred to neurologist Natalie Hendrix for maintenance of these as well as all other neurological symptoms she experiences due to history of CVA and thoracic outlet syndrome.  Hct today is 36.6.  She is doing well on her 2 L [place] supplemental O2.  She has tenderness in the left side below the axilla, by the breast. She is scheduled for a mammogram this week on Thursday to further evaluate. No mass noted on exam but she does have tenderness.  No lymphadenopathy.   She has had no episodes of bleeding, no bruising or petechiae.  No swelling, tenderness, numbness or tingling in her extremities at this time.  Her appetite comes and goes. She is doing her best to stay well hydrated. Her weight is stable.   ECOG Performance Status: 1 - Symptomatic but completely ambulatory  Medications:  Allergies as of 04/07/2018      Reactions   Qvar [beclomethasone]    Spiriva [tiotropium Bromide Monohydrate]    rash   Chlorzoxazone    Epinephrine    Raises heart rate   Lamotrigine    Levofloxacin    Neurontin [gabapentin]    arthralgia   Protriptyline Hcl    Severe constipation   Ropinirole Hcl    arthralgia   Tizanidine    panic   Verapamil    Zonegran    Mood swings   Advair Diskus [fluticasone-salmeterol] Rash   Baclofen Rash   Chlorzoxazone Rash   Levofloxacin Rash   Protriptyline Rash   Ropinirole Rash      Medication List        Accurate as of 04/07/18  1:41 PM. Always use your most recent med list.          albuterol 108 (90 Base) MCG/ACT inhaler Commonly known as:   PROAIR HFA Inhale 2 puffs into the lungs every 6 (six) hours as needed for wheezing or shortness of breath.   albuterol (2.5 MG/3ML) 0.083% nebulizer solution Commonly known as:  PROVENTIL Take 3 mLs (2.5 mg total) by nebulization every 6 (six) hours as needed for wheezing or shortness of breath.   azelastine 0.1 % nasal spray Commonly known as:  ASTELIN Place 1 spray into both nostrils 2 (two) times daily.   beclomethasone 42 MCG/SPRAY nasal spray Commonly known as:  BECONASE-AQ Place 2 sprays into both nostrils 2 (two) times daily. Dose is for each nostril.   benzonatate 100 MG capsule Commonly known as:  TESSALON Take 1 capsule (100 mg total) by mouth every 6 (six) hours as needed for cough.   buPROPion 300 MG 24 hr tablet Commonly known as:  WELLBUTRIN XL Take 1 tablet (300 mg total) by mouth daily with breakfast.   CALCIUM 1200 PO Take 2,400 mg by mouth daily.   CENTRUM SILVER tablet Take 1 tablet by mouth daily.   clopidogrel 75 MG tablet Commonly known as:  PLAVIX Take 1 tablet (75 mg total) by mouth daily.   co-enzyme Q-10 30 MG capsule Take 30 mg by mouth daily.   cyclobenzaprine 5 MG tablet Commonly known as:  FLEXERIL Take 5 mg by mouth 2 (two) times daily as needed for muscle spasms. [place]   ESTRACE VAGINAL 0.1 MG/GM vaginal cream Generic drug:  estradiol Place 1 Applicatorful vaginally daily. Small amount each dose per pt   ezetimibe 10 MG tablet Commonly known as:  ZETIA TAKE 1 TABLET BY MOUTH DAILY.   fexofenadine 180 MG tablet Commonly known as:  ALLEGRA Take 1 tablet (180 mg total) by mouth daily as needed for allergies. As needed   Fish Oil 1200 MG Caps Take 1,200 mg by mouth at bedtime.   hyoscyamine 0.125 MG Tbdp disintergrating tablet Commonly known as:  ANASPAZ Place 1 tablet (0.125 mg total) under the tongue 2 (two) times daily as needed.   lidocaine 5 % Commonly known as:  LIDODERM Place 1 patch onto the skin as needed (pain).  Remove & Discard patch within 12 hours or as directed by MD   LORazepam 1 MG tablet Commonly known as:  ATIVAN Take 1 tablet (1 mg total) by mouth 2 (two) times daily. May take extra 3rd dose if needed   magic mouthwash w/lidocaine Soln Take 5 mLs by mouth 4 (four) times daily.   magnesium gluconate 500 MG tablet Commonly known as:  MAGONATE Take 500 mg by mouth daily as needed (constipation).   meclizine 25 MG tablet Commonly known as:  ANTIVERT Take 1 tablet (25 mg total) by mouth 3 (three) times daily as needed for dizziness. 3 month supply   metoprolol succinate 50 MG 24 hr tablet Commonly known as:  TOPROL-XL Take 1 tablet (50 mg total) by mouth daily with breakfast.   morphine 15 MG tablet Commonly known as:  MSIR Take 15 mg by mouth every 4 (four) hours as needed for severe pain.   morphine 30 MG 12 hr tablet Commonly known as:  MS CONTIN Take 30 mg by mouth every 12 (twelve) hours.   Nebulizer Devi Use as directed   OXYGEN Inhale 2.5 L/hr into the lungs continuous.   pantoprazole 40 MG tablet Commonly known as:  PROTONIX TAKE 1 TABLET (40 MG TOTAL) BY MOUTH DAILY.   pravastatin 40 MG tablet Commonly known as:  PRAVACHOL Take 1 tablet (40 mg total) by mouth at bedtime.   promethazine 25 MG tablet Commonly known as:  PHENERGAN Take 25 mg by mouth every 8 (eight) hours as needed for nausea or vomiting.   SYSTANE 0.4-0.3 % Soln Generic drug:  Polyethyl Glycol-Propyl Glycol Apply 1 drop to eye daily as needed (dry eyes).   TART CHERRY ADVANCED PO Take 5 mLs by mouth daily. Juice Concentrate- 1tsp daily   ZOVIRAX 5 % Generic drug:  acyclovir ointment Apply 1 application topically as needed (flair).       Allergies:  Allergies  Allergen Reactions  . Qvar [Beclomethasone]   . Spiriva [Tiotropium Bromide Monohydrate]     rash  . Chlorzoxazone   . Epinephrine     Raises heart rate  . Lamotrigine   . Levofloxacin   . Neurontin [Gabapentin]      arthralgia  . Protriptyline Hcl     Severe constipation  . Ropinirole Hcl     arthralgia  . Tizanidine     panic  . Verapamil   . Zonegran     Mood swings  . Advair Diskus [Fluticasone-Salmeterol] Rash  . Baclofen Rash  . Chlorzoxazone Rash  . Levofloxacin Rash  . Protriptyline Rash  . Ropinirole Rash    Past Medical History, Surgical history, Social history,  and Family History were reviewed and updated.  Review of Systems: All other 10 point review of systems is negative.   Physical Exam:  vitals were not taken for this visit.   Wt Readings from Last 3 Encounters:  03/10/18 206 lb (93.4 kg)  12/16/17 200 lb (90.7 kg)  10/08/17 199 lb (90.3 kg)    Ocular: Sclerae unicteric, pupils equal, round and reactive to light Ear-nose-throat: Oropharynx clear, dentition fair Lymphatic: No cervical, supraclavicular or axillary adenopathy Lungs no rales or rhonchi, good excursion bilaterally Heart regular rate and rhythm, no murmur appreciated Abd soft, nontender, positive bowel sounds, no liver or spleen tip palpated on exam, no fluid wave  MSK no focal spinal tenderness, no joint edema Neuro: non-focal, well-oriented, appropriate affect Breasts: Deferred   Lab Results  Component Value Date   WBC 5.7 03/27/2018   HGB 11.0 (L) 03/27/2018   HCT 35.6 03/27/2018   MCV 78.6 (L) 03/27/2018   PLT 261 03/27/2018   Lab Results  Component Value Date   FERRITIN 4 (L) 03/27/2018   IRON 32 (L) 03/27/2018   TIBC 454 (H) 03/27/2018   UIBC 422 03/27/2018   IRONPCTSAT 7 (L) 03/27/2018   Lab Results  Component Value Date   RETICCTPCT 1.1 04/27/2015   RBC 4.53 03/27/2018   RETICCTABS 55.1 04/27/2015   No results found for: KPAFRELGTCHN, LAMBDASER, KAPLAMBRATIO No results found for: IGGSERUM, IGA, IGMSERUM No results found for: Odetta Pink, SPEI   Chemistry      Component Value Date/Time   NA 140 03/27/2018 1359   NA 147  (H) 10/08/2017 1410   NA 141 02/17/2017 1132   K 4.7 03/27/2018 1359   K 4.6 10/08/2017 1410   K 5.3 (H) 02/17/2017 1132   CL 108 03/27/2018 1359   CL 106 10/08/2017 1410   CO2 25 03/27/2018 1359   CO2 30 10/08/2017 1410   CO2 29 02/17/2017 1132   BUN 20 03/27/2018 1359   BUN 15 10/08/2017 1410   BUN 17.8 02/17/2017 1132   CREATININE 0.86 03/27/2018 1359   CREATININE 1.0 10/08/2017 1410   CREATININE 0.8 02/17/2017 1132      Component Value Date/Time   CALCIUM 9.3 03/27/2018 1359   CALCIUM 9.0 10/08/2017 1410   CALCIUM 9.3 02/17/2017 1132   ALKPHOS 69 03/27/2018 1359   ALKPHOS 59 10/08/2017 1410   ALKPHOS 79 02/17/2017 1132   AST 28 03/27/2018 1359   AST 22 02/17/2017 1132   ALT 18 03/27/2018 1359   ALT 26 10/08/2017 1410   ALT 20 02/17/2017 1132   BILITOT 0.3 03/27/2018 1359   BILITOT 0.39 02/17/2017 1132      Impression and Plan: Ms. Lanza is a very pleasant 67 yo caucasian female with polycythemia. She is doing fairly well. Hct is 36.6% so no phlebotomy needed this visit. She will have her mammogram later this week to further assess for cause of left breast pain.  We do recommend her PCP place a referral to Natalie Hendrix for management of her many neurological symptoms. We will plan to see her back in another month for follow-up.  She will contact our office with any questions or concerns. We can certainly see her sooner if need be.   [name], NP 5/7/20191:41 PM

## 2018-04-10 ENCOUNTER — Other Ambulatory Visit: Payer: Self-pay | Admitting: Radiology

## 2018-04-10 DIAGNOSIS — R2232 Localized swelling, mass and lump, left upper limb: Secondary | ICD-10-CM

## 2018-04-13 ENCOUNTER — Other Ambulatory Visit: Payer: Self-pay | Admitting: Radiology

## 2018-04-13 ENCOUNTER — Ambulatory Visit
Admission: RE | Admit: 2018-04-13 | Discharge: 2018-04-13 | Disposition: A | Discharge status: Home / Self Care | Payer: Medicare Other | Source: Ambulatory Visit | Attending: Radiology | Admitting: Radiology

## 2018-04-13 DIAGNOSIS — R2232 Localized swelling, mass and lump, left upper limb: Secondary | ICD-10-CM

## 2018-04-13 DIAGNOSIS — N644 Mastodynia: Secondary | ICD-10-CM

## 2018-04-14 ENCOUNTER — Other Ambulatory Visit: Payer: Medicare Other

## 2018-04-23 ENCOUNTER — Encounter: Payer: Self-pay | Admitting: Internal Medicine

## 2018-04-23 ENCOUNTER — Ambulatory Visit (INDEPENDENT_AMBULATORY_CARE_PROVIDER_SITE_OTHER): Payer: Medicare Other | Admitting: Internal Medicine

## 2018-04-23 DIAGNOSIS — J9611 Chronic respiratory failure with hypoxia: Secondary | ICD-10-CM | POA: Diagnosis not present

## 2018-04-23 DIAGNOSIS — D45 Polycythemia vera: Secondary | ICD-10-CM

## 2018-04-23 NOTE — Patient Instructions (Signed)
We can continue O2 2l for sleep and portable  Please call if we can help

## 2018-04-23 NOTE — Progress Notes (Signed)
HPI female never smoker followed for asthma, history pneumonia, cough, complicated by past history for thoracic outlet syndrome, chronic hypoxic respiratory failure, complicated by CVA, GERD, allergic rhinitis, polycythemia, depression O2 2 L sleep and prn/Advanced  Nl PFT 2012 except DLCO 62% Allergy profile was NEG, 4.9 total IgE GI/ Dr Paulita Fujita: Ba swallow> stricture and probably mild aspiration Residual right diaphragm elevation after surgery for right thoracic outlet syndrome --------------------------------------------------------------------------------  07/11/17- 67 year old female never smoker followed for asthma, history pneumonia, cough, chronic hypoxic respiratory failure,  past history for thoracic outlet syndrome, chronic elevation right diaphragm,complicated by CVA, GERD, allergic rhinitis, polycythemia, depression O2 2 L sleep and when necessary/Advanced  FOLLOWS FOR: Pt had TIA 05-25-17 and continues to have issues with vertigo when moving head; migraines, speech issues as well. Continues to use O2.  Daily productive cough is now just producing some clear sputum after recent antibiotic cleared purulent. No routine fever but says she has been told her polycythemia might cause temperature changes. Getting repeated phlebotomies. Blames "allergy" for throat tickle cough when she is outside. At home in air conditioning she denies cough or breathing problems. Using nebulizer machine/albuterol about twice daily, occasional rescue inhaler. Continues azelastine and Beconase nasal sprays. O2 2 L portable concentrator. Would like new small portable concentrator when available. CXR 01/08/17 IMPRESSION: Stable exam.  No evidence of acute disease.  04/23/2018-  67 year old female never smoker followed for asthma, history pneumonia, cough, chronic hypoxic respiratory failure,  past history for thoracic outlet syndrome, chronic elevation right diaphragm,complicated by CVA, GERD, allergic rhinitis,  polycythemia, depression O2 2-3 L sleep and prn/Advanced   POC = [employer]. ----Chronic Resp failure with hypoxia-pt states she does not go out much when its allergy season and this has helped her breathing. Continues to use O2.  O2 requalified today Pro-air, nebulizer albuterol, Beconase nasal spray Doing very well, staying in to avoid environmental exposures.  Her home has an elaborate air filter system installed by her husband. Using neb twice daily.  Thinks Tamiflu last year caused mental status changes.  Review of Systems- See HPI   + = pos Constitutional:   No-   weight loss, night sweats, fevers, chills, fatigue, lassitude. HEENT:   frequent  headaches,  Some difficulty swallowing,, sore throat,       No-  sneezing, itching, ear ache, nasal congestion, post nasal drip,  CV:  No-   chest pain, orthopnea, PND, swelling in lower extremities, anasarca, dizziness, palpitations Resp: + shortness of breath with exertion or at rest. productive cough              No-  coughing up of blood.              No-  change in color of mucus.   wheezing.   Skin: No-   rash or lesions. GI:  No-   heartburn, indigestion, abdominal pain, nausea, vomiting, GU:  MS:  No-   joint pain or swelling.   Neuro- + tremor and poor balance Psych:  No- change in mood or affect. No depression or anxiety.  No memory loss.    Objective:   Physical Exam General- Alert, Oriented, Affect+ tearful, Distress- none acute,  Overweight.  + Rolling walker              On POC. Skin- rash-none, lesions- none, excoriation- none Lymphadenopathy- none Head- atraumatic            Eyes- Gross vision intact, PERRLA, conjunctivae clear secretions  Ears- Hearing, canals            Nose- Clear, No- Septal dev, mucus, polyps, erosion, perforation             Throat- Mallampati III , mucosa clear , drainage- none, tonsils- atrophic, Neck- flexible , trachea midline, no stridor , thyroid nl, carotid no bruit Chest -  symmetrical excursion , unlabored           Heart/CV- RRR , no murmur , no gallop  , no rub, nl s1 s2                           - JVD- none , edema- none, stasis changes- none, varices- none           Lung- clear to P&A, wheeze-none, cough-none,                                                    dullness-none, rub- none, unlabored           Chest wall- +Vascular surgery scar Right Upper Anterior chest Abd- Br/ Gen/ Rectal- Not done, not indicated Extrem- cyanosis- none, clubbing, none, atrophy- none, strength- . +Rolling walker Neuro- + some word searching

## 2018-04-27 NOTE — Assessment & Plan Note (Signed)
Mild iron deficiency associated with phlebotomy probably has a small impact on oxygen transport.

## 2018-04-27 NOTE — Assessment & Plan Note (Signed)
She is stable and comfortable, at baseline now, remains dependent on oxygen.

## 2018-05-05 ENCOUNTER — Inpatient Hospital Stay: Payer: Medicare Other

## 2018-05-05 ENCOUNTER — Inpatient Hospital Stay: Payer: Medicare Other | Attending: Hematology & Oncology | Admitting: Family

## 2018-05-05 DIAGNOSIS — D45 Polycythemia vera: Secondary | ICD-10-CM | POA: Insufficient documentation

## 2018-05-06 ENCOUNTER — Telehealth: Payer: Self-pay | Admitting: Neurology

## 2018-05-06 NOTE — Telephone Encounter (Signed)
Sure, ok to switch.  She has seen Dr. Tomi Likens for stroke it looks like.

## 2018-05-06 NOTE — Telephone Encounter (Signed)
This patient last saw Dr Rexene Alberts in 2014. They are now being referred back for f/u hx of stroke. The patient wants to see you. Is it ok to switch to you?

## 2018-05-06 NOTE — Telephone Encounter (Signed)
You last saw this patient 2014. They are being referred back to our office for f/u hx of stroke. They would like to see Dr Jaynee Eagles. Is it ok to switch ?

## 2018-05-06 NOTE — Telephone Encounter (Signed)
That's fine. thanks

## 2018-05-15 ENCOUNTER — Encounter: Payer: Self-pay | Admitting: Family

## 2018-05-15 ENCOUNTER — Inpatient Hospital Stay: Payer: Medicare Other

## 2018-05-15 ENCOUNTER — Other Ambulatory Visit: Payer: Self-pay

## 2018-05-15 ENCOUNTER — Inpatient Hospital Stay (HOSPITAL_BASED_OUTPATIENT_CLINIC_OR_DEPARTMENT_OTHER): Payer: Medicare Other | Admitting: Family

## 2018-05-15 VITALS — BP 112/69 | HR 88 | Resp 18

## 2018-05-15 VITALS — BP 123/79 | HR 90 | Temp 98.5°F | Resp 18 | Wt 199.0 lb

## 2018-05-15 DIAGNOSIS — D45 Polycythemia vera: Secondary | ICD-10-CM

## 2018-05-15 DIAGNOSIS — D500 Iron deficiency anemia secondary to blood loss (chronic): Secondary | ICD-10-CM

## 2018-05-15 LAB — CMP (CANCER CENTER ONLY)
ALBUMIN: 3.8 g/dL (ref 3.5–5.0)
ALT: 27 U/L (ref 10–47)
AST: 30 U/L (ref 11–38)
Alkaline Phosphatase: 78 U/L (ref 26–84)
Anion gap: 11 (ref 5–15)
BUN: 15 mg/dL (ref 7–22)
CALCIUM: 9.1 mg/dL (ref 8.0–10.3)
CO2: 27 mmol/L (ref 18–33)
Chloride: 107 mmol/L (ref 98–108)
Creatinine: 1.1 mg/dL (ref 0.60–1.20)
GLUCOSE: 107 mg/dL (ref 73–118)
Potassium: 4.3 mmol/L (ref 3.3–4.7)
SODIUM: 145 mmol/L (ref 128–145)
Total Bilirubin: 0.6 mg/dL (ref 0.2–1.6)
Total Protein: 6.9 g/dL (ref 6.4–8.1)

## 2018-05-15 LAB — CBC WITH DIFFERENTIAL (CANCER CENTER ONLY)
BASOS ABS: 0 10*3/uL (ref 0.0–0.1)
BASOS PCT: 1 %
EOS ABS: 0.1 10*3/uL (ref 0.0–0.5)
Eosinophils Relative: 2 %
HCT: 39.7 % (ref 34.8–46.6)
Hemoglobin: 12.3 g/dL (ref 11.6–15.9)
LYMPHS PCT: 30 %
Lymphs Abs: 1.8 10*3/uL (ref 0.9–3.3)
MCH: 23 pg — ABNORMAL LOW (ref 26.0–34.0)
MCHC: 31 g/dL — ABNORMAL LOW (ref 32.0–36.0)
MCV: 74.2 fL — AB (ref 81.0–101.0)
MONO ABS: 0.7 10*3/uL (ref 0.1–0.9)
Monocytes Relative: 11 %
Neutro Abs: 3.6 10*3/uL (ref 1.5–6.5)
Neutrophils Relative %: 56 %
PLATELETS: 289 10*3/uL (ref 145–400)
RBC: 5.35 MIL/uL — AB (ref 3.70–5.32)
RDW: 16.3 % — ABNORMAL HIGH (ref 11.1–15.7)
WBC: 6.2 10*3/uL (ref 3.9–10.0)

## 2018-05-15 MED ORDER — SODIUM CHLORIDE 0.9 % IV SOLN
Freq: Once | INTRAVENOUS | Status: AC
  Administered 2018-05-15: 16:00:00 via INTRAVENOUS

## 2018-05-15 NOTE — Progress Notes (Signed)
Natalie Hendrix presents today for phlebotomy per MD orders. Phlebotomy procedure started with 18 gauge angiocatheter at 1600 and ended at 1610. 254 grams removed. Patient observed for 30 minutes after procedure without any incident. Patient tolerated procedure well. IV needle removed intact.

## 2018-05-15 NOTE — Patient Instructions (Signed)
Therapeutic Phlebotomy Therapeutic phlebotomy is the controlled removal of blood from a person's body for the purpose of treating a medical condition. The procedure is similar to donating blood. Usually, about a pint (470 mL, or 0.47L) of blood is removed. The average adult has 9-12 pints (4.3-5.7 L) of blood. Therapeutic phlebotomy may be used to treat the following medical conditions:  Hemochromatosis. This is a condition in which the blood contains too much iron.  Polycythemia vera. This is a condition in which the blood contains too many red blood cells.  Porphyria cutanea tarda. This is a disease in which an important part of hemoglobin is not made properly. It results in the buildup of abnormal amounts of porphyrins in the body.  Sickle cell disease. This is a condition in which the red blood cells form an abnormal crescent shape rather than a round shape.  Tell a health care provider about:  Any allergies you have.  All medicines you are taking, including vitamins, herbs, eye drops, creams, and over-the-counter medicines.  Any problems you or family members have had with anesthetic medicines.  Any blood disorders you have.  Any surgeries you have had.  Any medical conditions you have. What are the risks? Generally, this is a safe procedure. However, problems may occur, including:  Nausea or light-headedness.  Low blood pressure.  Soreness, bleeding, swelling, or bruising at the needle insertion site.  Infection.  What happens before the procedure?  Follow instructions from your health care provider about eating or drinking restrictions.  Ask your health care provider about changing or stopping your regular medicines. This is especially important if you are taking diabetes medicines or blood thinners.  Wear clothing with sleeves that can be raised above the elbow.  Plan to have someone take you home after the procedure.  You may have a blood sample taken. What  happens during the procedure?  A needle will be inserted into one of your veins.  Tubing and a collection bag will be attached to that needle.  Blood will flow through the needle and tubing into the collection bag.  You may be asked to open and close your hand slowly and continually during the entire collection.  After the specified amount of blood has been removed from your body, the collection bag and tubing will be clamped.  The needle will be removed from your vein.  Pressure will be held on the site of the needle insertion to stop the bleeding.  A bandage (dressing) will be placed over the needle insertion site. The procedure may vary among health care providers and hospitals. What happens after the procedure?  Your recovery will be assessed and monitored.  You can return to your normal activities as directed by your health care provider. This information is not intended to replace advice given to you by your health care provider. Make sure you discuss any questions you have with your health care provider. Document Released: 04/22/2011 Document Revised: 07/20/2016 Document Reviewed: 11/14/2014 Elsevier Interactive Patient Education  2018 [employer]  

## 2018-05-15 NOTE — Progress Notes (Signed)
Hematology and Oncology Follow Up Visit  Natalie Hendrix [id number] [birth year] 67 y.o. 05/15/2018   Principle Diagnosis:  Polycythemia vera- JAK2 negative  Current Therapy:   Phlebotomy to maintain hematocrit less than 38% Plavix 75 mg by mouth daily   Interim History: Natalie Hendrix is here today with her husband for follow-up. She is symptomatic with fatigue, more frequent headaches, dizziness and ruddy complexion.  Hct today is 39.7%.  No bruising or petechiae. No episode of bleeding on Plavix.  She is doing well on 2 L supplemental O2. SOB with over exertion is stable. She takes breaks to rest as needed.  No fever, chills, n/v, cough, rash, dizziness, chest pain, palpitations, abdominal pain or changes in bowel or bladder habits.  No falls or syncope. She uses her cane or rolling walker when ambulating for support.  No swelling or tenderness in her extremities. No lymphadenopathy noted on exam.  She does not have much of an appetite. She does drink protein shakes. She is staying well hydrated. Her weight is stable.   ECOG Performance Status: 1 - Symptomatic but completely ambulatory  Medications:  Allergies as of 05/15/2018      Reactions   Qvar [beclomethasone]    Spiriva [tiotropium Bromide Monohydrate]    rash   Chlorzoxazone    Epinephrine    Raises heart rate   Lamotrigine    Levofloxacin    Neurontin [gabapentin]    arthralgia   Protriptyline Hcl    Severe constipation   Ropinirole Hcl    arthralgia   Tamiflu [oseltamivir Phosphate] Other (See Comments)   "like I was having a stroke"   Tizanidine    panic   Verapamil    Zonegran    Mood swings   Advair Diskus [fluticasone-salmeterol] Rash   Baclofen Rash   Chlorzoxazone Rash   Levofloxacin Rash   Protriptyline Rash   Ropinirole Rash      Medication List        Accurate as of 05/15/18  3:41 PM. Always use your most recent med list.          albuterol 108 (90 Base) MCG/ACT inhaler Commonly known as:   PROAIR HFA Inhale 2 puffs into the lungs every 6 (six) hours as needed for wheezing or shortness of breath.   albuterol (2.5 MG/3ML) 0.083% nebulizer solution Commonly known as:  PROVENTIL Take 3 mLs (2.5 mg total) by nebulization every 6 (six) hours as needed for wheezing or shortness of breath.   azelastine 0.1 % nasal spray Commonly known as:  ASTELIN Place 1 spray into both nostrils 2 (two) times daily.   beclomethasone 42 MCG/SPRAY nasal spray Commonly known as:  BECONASE-AQ Place 2 sprays into both nostrils 2 (two) times daily. Dose is for each nostril.   benzonatate 100 MG capsule Commonly known as:  TESSALON Take 1 capsule (100 mg total) by mouth every 6 (six) hours as needed for cough.   buPROPion 300 MG 24 hr tablet Commonly known as:  WELLBUTRIN XL Take 1 tablet (300 mg total) by mouth daily with breakfast.   CALCIUM 1200 PO Take 1,250 mg by mouth daily.   CENTRUM SILVER tablet Take 1 tablet by mouth daily.   clopidogrel 75 MG tablet Commonly known as:  PLAVIX Take 1 tablet (75 mg total) by mouth daily.   co-enzyme Q-10 30 MG capsule Take 30 mg by mouth daily.   cyclobenzaprine 5 MG tablet Commonly known as:  FLEXERIL Take 5 mg by mouth 2 (two)  times daily as needed for muscle spasms. Phillips   ESTRACE VAGINAL 0.1 MG/GM vaginal cream Generic drug:  estradiol Place 1 Applicatorful vaginally daily. Small amount each dose per pt   ezetimibe 10 MG tablet Commonly known as:  ZETIA TAKE 1 TABLET BY MOUTH DAILY.   fexofenadine 180 MG tablet Commonly known as:  ALLEGRA Take 1 tablet (180 mg total) by mouth daily as needed for allergies. As needed   Fish Oil 1200 MG Caps Take 1,200 mg by mouth at bedtime.   hyoscyamine 0.125 MG Tbdp disintergrating tablet Commonly known as:  ANASPAZ Place 1 tablet (0.125 mg total) under the tongue 2 (two) times daily as needed.   lidocaine 5 % Commonly known as:  LIDODERM Place 1 patch onto the skin as needed (pain).  Remove & Discard patch within 12 hours or as directed by MD   LORazepam 1 MG tablet Commonly known as:  ATIVAN Take 1 tablet (1 mg total) by mouth 2 (two) times daily. May take extra 3rd dose if needed   magic mouthwash w/lidocaine Soln Take 5 mLs by mouth 4 (four) times daily.   magnesium gluconate 500 MG tablet Commonly known as:  MAGONATE Take 500 mg by mouth daily as needed (constipation).   meclizine 25 MG tablet Commonly known as:  ANTIVERT Take 1 tablet (25 mg total) by mouth 3 (three) times daily as needed for dizziness. 3 month supply   metoprolol succinate 50 MG 24 hr tablet Commonly known as:  TOPROL-XL Take 1 tablet (50 mg total) by mouth daily with breakfast.   morphine 15 MG tablet Commonly known as:  MSIR Take 15 mg by mouth every 4 (four) hours as needed for severe pain.   morphine 30 MG 12 hr tablet Commonly known as:  MS CONTIN Take 30 mg by mouth every 12 (twelve) hours.   Nebulizer Devi Use as directed   OXYGEN Inhale 2.5 L/hr into the lungs continuous.   pantoprazole 40 MG tablet Commonly known as:  PROTONIX TAKE 1 TABLET (40 MG TOTAL) BY MOUTH DAILY.   pravastatin 40 MG tablet Commonly known as:  PRAVACHOL Take 1 tablet (40 mg total) by mouth at bedtime.   promethazine 25 MG tablet Commonly known as:  PHENERGAN Take 25 mg by mouth every 8 (eight) hours as needed for nausea or vomiting.   SYSTANE 0.4-0.3 % Soln Generic drug:  Polyethyl Glycol-Propyl Glycol Apply 1 drop to eye daily as needed (dry eyes).   TART CHERRY ADVANCED PO Take 5 mLs by mouth daily. Juice Concentrate- 1tsp daily   ZOVIRAX 5 % Generic drug:  acyclovir ointment Apply 1 application topically as needed (flair).       Allergies:  Allergies  Allergen Reactions  . Qvar [Beclomethasone]   . Spiriva [Tiotropium Bromide Monohydrate]     rash  . Chlorzoxazone   . Epinephrine     Raises heart rate  . Lamotrigine   . Levofloxacin   . Neurontin [Gabapentin]      arthralgia  . Protriptyline Hcl     Severe constipation  . Ropinirole Hcl     arthralgia  . Tamiflu [Oseltamivir Phosphate] Other (See Comments)    "like I was having a stroke"  . Tizanidine     panic  . Verapamil   . Zonegran     Mood swings  . Advair Diskus [Fluticasone-Salmeterol] Rash  . Baclofen Rash  . Chlorzoxazone Rash  . Levofloxacin Rash  . Protriptyline Rash  . Ropinirole Rash  Past Medical History, Surgical history, Social history, and Family History were reviewed and updated.  Review of Systems: All other 10 point review of systems is negative.   Physical Exam:  vitals were not taken for this visit.   Wt Readings from Last 3 Encounters:  04/23/18 201 lb 12.8 oz (91.5 kg)  04/07/18 203 lb (92.1 kg)  03/10/18 206 lb (93.4 kg)    Ocular: Sclerae unicteric, pupils equal, round and reactive to light Ear-nose-throat: Oropharynx clear, dentition fair Lymphatic: No cervical, supraclavicular or axillary adenopathy Lungs no rales or rhonchi, good excursion bilaterally Heart regular rate and rhythm, no murmur appreciated Abd soft, nontender, positive bowel sounds, no liver or spleen tip palpated on exam, no fluid wave  MSK no focal spinal tenderness, no joint edema Neuro: non-focal, well-oriented, appropriate affect Breasts: Deferred   Lab Results  Component Value Date   WBC 6.2 05/15/2018   HGB 12.3 05/15/2018   HCT 39.7 05/15/2018   MCV 74.2 (L) 05/15/2018   PLT 289 05/15/2018   Lab Results  Component Value Date   FERRITIN 4 (L) 03/27/2018   IRON 32 (L) 03/27/2018   TIBC 454 (H) 03/27/2018   UIBC 422 03/27/2018   IRONPCTSAT 7 (L) 03/27/2018   Lab Results  Component Value Date   RETICCTPCT 1.1 04/27/2015   RBC 5.35 (H) 05/15/2018   RETICCTABS 55.1 04/27/2015   No results found for: KPAFRELGTCHN, LAMBDASER, KAPLAMBRATIO No results found for: Kandis Cocking, IGMSERUM No results found for: Odetta Pink, SPEI   Chemistry      Component Value Date/Time   NA 142 04/07/2018 1329   NA 147 (H) 10/08/2017 1410   NA 141 02/17/2017 1132   K 4.8 (H) 04/07/2018 1329   K 4.6 10/08/2017 1410   K 5.3 (H) 02/17/2017 1132   CL 108 04/07/2018 1329   CL 106 10/08/2017 1410   CO2 29 04/07/2018 1329   CO2 30 10/08/2017 1410   CO2 29 02/17/2017 1132   BUN 13 04/07/2018 1329   BUN 15 10/08/2017 1410   BUN 17.8 02/17/2017 1132   CREATININE 0.80 04/07/2018 1329   CREATININE 1.0 10/08/2017 1410   CREATININE 0.8 02/17/2017 1132      Component Value Date/Time   CALCIUM 9.8 04/07/2018 1329   CALCIUM 9.0 10/08/2017 1410   CALCIUM 9.3 02/17/2017 1132   ALKPHOS 61 04/07/2018 1329   ALKPHOS 59 10/08/2017 1410   ALKPHOS 79 02/17/2017 1132   AST 27 04/07/2018 1329   AST 22 02/17/2017 1132   ALT 25 04/07/2018 1329   ALT 26 10/08/2017 1410   ALT 20 02/17/2017 1132   BILITOT 0.7 04/07/2018 1329   BILITOT 0.39 02/17/2017 1132      Impression and Plan: MS. Ingman is a very pleasant 67 yo caucasian female with polycythemia, JAK-2 negative. Hct today is 39.7 so we will do a mini phlebotomy today and replacement fluids.  We will plan to see her back in another months for follow-up.  They will contact our office with any questions or concerns. We can certainly see her sooner if need be.   [name], NP 6/14/20193:41 PM

## 2018-05-18 LAB — IRON AND TIBC
Iron: 40 ug/dL — ABNORMAL LOW (ref 41–142)
Saturation Ratios: 8 % — ABNORMAL LOW (ref 21–57)
TIBC: 478 ug/dL — AB (ref 236–444)
UIBC: 438 ug/dL

## 2018-05-18 LAB — FERRITIN: Ferritin: 5 ng/mL — ABNORMAL LOW (ref 9–269)

## 2018-06-16 ENCOUNTER — Inpatient Hospital Stay: Payer: Medicare Other | Admitting: Family

## 2018-06-16 ENCOUNTER — Encounter: Payer: Self-pay | Admitting: Neurology

## 2018-06-16 ENCOUNTER — Inpatient Hospital Stay: Payer: Medicare Other

## 2018-06-19 ENCOUNTER — Other Ambulatory Visit: Payer: Self-pay

## 2018-06-19 ENCOUNTER — Inpatient Hospital Stay: Payer: Medicare Other

## 2018-06-19 ENCOUNTER — Encounter: Payer: Self-pay | Admitting: Family

## 2018-06-19 ENCOUNTER — Inpatient Hospital Stay: Payer: Medicare Other | Attending: Hematology & Oncology

## 2018-06-19 ENCOUNTER — Inpatient Hospital Stay (HOSPITAL_BASED_OUTPATIENT_CLINIC_OR_DEPARTMENT_OTHER): Payer: Medicare Other | Admitting: Family

## 2018-06-19 VITALS — BP 132/61 | HR 74 | Resp 20

## 2018-06-19 VITALS — BP 116/49 | HR 68 | Temp 98.5°F | Resp 20 | Wt 201.0 lb

## 2018-06-19 DIAGNOSIS — D45 Polycythemia vera: Secondary | ICD-10-CM | POA: Diagnosis present

## 2018-06-19 DIAGNOSIS — D500 Iron deficiency anemia secondary to blood loss (chronic): Secondary | ICD-10-CM

## 2018-06-19 LAB — CMP (CANCER CENTER ONLY)
ALBUMIN: 3.8 g/dL (ref 3.5–5.0)
ALT: 18 U/L (ref 0–44)
AST: 23 U/L (ref 15–41)
Alkaline Phosphatase: 73 U/L (ref 38–126)
Anion gap: 6 (ref 5–15)
BUN: 13 mg/dL (ref 8–23)
CHLORIDE: 105 mmol/L (ref 98–111)
CO2: 28 mmol/L (ref 22–32)
Calcium: 9.4 mg/dL (ref 8.9–10.3)
Creatinine: 0.82 mg/dL (ref 0.44–1.00)
GFR, Est AFR Am: >60 mL/min (ref >60)
Glucose, Bld: 105 mg/dL — ABNORMAL HIGH (ref 70–99)
POTASSIUM: 4.6 mmol/L (ref 3.5–5.1)
SODIUM: 139 mmol/L (ref 135–145)
Total Bilirubin: 0.3 mg/dL (ref 0.3–1.2)
Total Protein: 6.5 g/dL (ref 6.5–8.1)

## 2018-06-19 LAB — CBC WITH DIFFERENTIAL (CANCER CENTER ONLY)
BASOS ABS: 0 10*3/uL (ref 0.0–0.1)
BASOS PCT: 0 %
EOS ABS: 0.1 10*3/uL (ref 0.0–0.5)
EOS PCT: 3 %
HCT: 39 % (ref 34.8–46.6)
Hemoglobin: 12 g/dL (ref 11.6–15.9)
Lymphocytes Relative: 25 %
Lymphs Abs: 1.1 10*3/uL (ref 0.9–3.3)
MCH: 23.2 pg — ABNORMAL LOW (ref 26.0–34.0)
MCHC: 30.8 g/dL — ABNORMAL LOW (ref 32.0–36.0)
MCV: 75.4 fL — AB (ref 81.0–101.0)
MONO ABS: 0.5 10*3/uL (ref 0.1–0.9)
Monocytes Relative: 12 %
Neutro Abs: 2.7 10*3/uL (ref 1.5–6.5)
Neutrophils Relative %: 60 %
PLATELETS: 252 10*3/uL (ref 145–400)
RBC: 5.17 MIL/uL (ref 3.70–5.32)
RDW: 16.6 % — AB (ref 11.1–15.7)
WBC: 4.5 10*3/uL (ref 3.9–10.0)

## 2018-06-19 MED ORDER — SODIUM CHLORIDE 0.9 % IV SOLN
1000.0000 mL | Freq: Once | INTRAVENOUS | Status: AC
  Administered 2018-06-19: 250 mL via INTRAVENOUS

## 2018-06-19 NOTE — Progress Notes (Signed)
Natalie Hendrix presents today for phlebotomy per MD orders. Phlebotomy procedure started at 1200 with 20 g angiocatheter.and ended at 1215. 260 grams removed. IVFluids started in same line at 500 cc/hr Patient observed for 30 minutes after procedure without any incident. Patient tolerated procedure well. IV needle removed intact.

## 2018-06-19 NOTE — Patient Instructions (Signed)
Therapeutic Phlebotomy Therapeutic phlebotomy is the controlled removal of blood from a person's body for the purpose of treating a medical condition. The procedure is similar to donating blood. Usually, about a pint (470 mL, or 0.47L) of blood is removed. The average adult has 9-12 pints (4.3-5.7 L) of blood. Therapeutic phlebotomy may be used to treat the following medical conditions:  Hemochromatosis. This is a condition in which the blood contains too much iron.  Polycythemia vera. This is a condition in which the blood contains too many red blood cells.  Porphyria cutanea tarda. This is a disease in which an important part of hemoglobin is not made properly. It results in the buildup of abnormal amounts of porphyrins in the body.  Sickle cell disease. This is a condition in which the red blood cells form an abnormal crescent shape rather than a round shape.  Tell a health care provider about:  Any allergies you have.  All medicines you are taking, including vitamins, herbs, eye drops, creams, and over-the-counter medicines.  Any problems you or family members have had with anesthetic medicines.  Any blood disorders you have.  Any surgeries you have had.  Any medical conditions you have. What are the risks? Generally, this is a safe procedure. However, problems may occur, including:  Nausea or light-headedness.  Low blood pressure.  Soreness, bleeding, swelling, or bruising at the needle insertion site.  Infection.  What happens before the procedure?  Follow instructions from your health care provider about eating or drinking restrictions.  Ask your health care provider about changing or stopping your regular medicines. This is especially important if you are taking diabetes medicines or blood thinners.  Wear clothing with sleeves that can be raised above the elbow.  Plan to have someone take you home after the procedure.  You may have a blood sample taken. What  happens during the procedure?  A needle will be inserted into one of your veins.  Tubing and a collection bag will be attached to that needle.  Blood will flow through the needle and tubing into the collection bag.  You may be asked to open and close your hand slowly and continually during the entire collection.  After the specified amount of blood has been removed from your body, the collection bag and tubing will be clamped.  The needle will be removed from your vein.  Pressure will be held on the site of the needle insertion to stop the bleeding.  A bandage (dressing) will be placed over the needle insertion site. The procedure may vary among health care providers and hospitals. What happens after the procedure?  Your recovery will be assessed and monitored.  You can return to your normal activities as directed by your health care provider. This information is not intended to replace advice given to you by your health care provider. Make sure you discuss any questions you have with your health care provider. Document Released: 04/22/2011 Document Revised: 07/20/2016 Document Reviewed: 11/14/2014 Elsevier Interactive Patient Education  2018 [employer]  

## 2018-06-19 NOTE — Progress Notes (Signed)
Hematology and Oncology Follow Up Visit  Natalie Hendrix [id number] 02-23-1951 67 y.o. 06/19/2018   Principle Diagnosis:  Polycythemia vera- JAK2 negative  Current Therapy:   Phlebotomy to maintain hematocrit less than 38% Plavix 75 mg by mouth daily   Interim History:  Natalie Hendrix is here today with her husband and service dog Cookie for follow-up. She has noted worsening of her migraines. She feels weak, fatigued and takes breaks to rest as needed. She also has angina and palpitations. She feels that her symptoms are worsened by the summer heat and humidity.  She has seen her eye doctor and has an appointment with neurology in August.  She has hot flashes and night sweats off and on. Occasionally she will have a low grade fever. This comes and goes.  No episodes of bleeding, no petechiae. She does bruise easily on Plavix.  No chills, n/v, cough, rash, dizziness, abdominal pain or changes in bowel or bladder habits.  Her SOB is stable.  No swelling, tenderness, numbness or tingling in her extremities.  No lymphadenopathy noted on exam. No falls or syncopal episodes.  Her appetite comes and goes. She is trying to stay well hydrated. Her weight is stable.   ECOG Performance Status: 2 - Symptomatic, <50% confined to bed  Medications:  Allergies as of 06/19/2018      Reactions   Qvar [beclomethasone]    Spiriva [tiotropium Bromide Monohydrate]    rash   Chlorzoxazone    Epinephrine    Raises heart rate   Lamotrigine    Levofloxacin    Neurontin [gabapentin]    arthralgia   Protriptyline Hcl    Severe constipation   Ropinirole Hcl    arthralgia   Tamiflu [oseltamivir Phosphate] Other (See Comments)   "like I was having a stroke"   Tizanidine    panic   Verapamil    Zonegran    Mood swings   Advair Diskus [fluticasone-salmeterol] Rash   Baclofen Rash   Chlorzoxazone Rash   Levofloxacin Rash   Protriptyline Rash   Ropinirole Rash      Medication List        Accurate  as of 06/19/18 10:57 AM. Always use your most recent med list.          albuterol 108 (90 Base) MCG/ACT inhaler Commonly known as:  PROAIR HFA Inhale 2 puffs into the lungs every 6 (six) hours as needed for wheezing or shortness of breath.   albuterol (2.5 MG/3ML) 0.083% nebulizer solution Commonly known as:  PROVENTIL Take 3 mLs (2.5 mg total) by nebulization every 6 (six) hours as needed for wheezing or shortness of breath.   azelastine 0.1 % nasal spray Commonly known as:  ASTELIN Place 1 spray into both nostrils 2 (two) times daily.   beclomethasone 42 MCG/SPRAY nasal spray Commonly known as:  BECONASE-AQ Place 2 sprays into both nostrils 2 (two) times daily. Dose is for each nostril.   benzonatate 100 MG capsule Commonly known as:  TESSALON Take 1 capsule (100 mg total) by mouth every 6 (six) hours as needed for cough.   buPROPion 300 MG 24 hr tablet Commonly known as:  WELLBUTRIN XL Take 1 tablet (300 mg total) by mouth daily with breakfast.   CALCIUM 1200 PO Take 1,250 mg by mouth daily.   CENTRUM SILVER tablet Take 1 tablet by mouth daily.   clopidogrel 75 MG tablet Commonly known as:  PLAVIX Take 1 tablet (75 mg total) by mouth daily.  co-enzyme Q-10 30 MG capsule Take 30 mg by mouth daily.   cyclobenzaprine 5 MG tablet Commonly known as:  FLEXERIL Take 5 mg by mouth 2 (two) times daily as needed for muscle spasms. Phillips   ESTRACE VAGINAL 0.1 MG/GM vaginal cream Generic drug:  estradiol Place 1 Applicatorful vaginally daily. Small amount each dose per pt   ezetimibe 10 MG tablet Commonly known as:  ZETIA TAKE 1 TABLET BY MOUTH DAILY.   fexofenadine 180 MG tablet Commonly known as:  ALLEGRA Take 1 tablet (180 mg total) by mouth daily as needed for allergies. As needed   Fish Oil 1200 MG Caps Take 1,200 mg by mouth at bedtime.   hyoscyamine 0.125 MG Tbdp disintergrating tablet Commonly known as:  ANASPAZ Place 1 tablet (0.125 mg total) under  the tongue 2 (two) times daily as needed.   lidocaine 5 % Commonly known as:  LIDODERM Place 1 patch onto the skin as needed (pain). Remove & Discard patch within 12 hours or as directed by MD   LORazepam 1 MG tablet Commonly known as:  ATIVAN Take 1 tablet (1 mg total) by mouth 2 (two) times daily. May take extra 3rd dose if needed   magic mouthwash w/lidocaine Soln Take 5 mLs by mouth 4 (four) times daily.   magnesium gluconate 500 MG tablet Commonly known as:  MAGONATE Take 500 mg by mouth daily as needed (constipation).   meclizine 25 MG tablet Commonly known as:  ANTIVERT Take 1 tablet (25 mg total) by mouth 3 (three) times daily as needed for dizziness. 3 month supply   metoprolol succinate 50 MG 24 hr tablet Commonly known as:  TOPROL-XL Take 1 tablet (50 mg total) by mouth daily with breakfast.   morphine 15 MG tablet Commonly known as:  MSIR Take 15 mg by mouth every 4 (four) hours as needed for severe pain.   morphine 30 MG 12 hr tablet Commonly known as:  MS CONTIN Take 30 mg by mouth every 12 (twelve) hours.   Nebulizer Devi Use as directed   OXYGEN Inhale 2.5 L/hr into the lungs continuous.   pantoprazole 40 MG tablet Commonly known as:  PROTONIX TAKE 1 TABLET (40 MG TOTAL) BY MOUTH DAILY.   pravastatin 40 MG tablet Commonly known as:  PRAVACHOL Take 1 tablet (40 mg total) by mouth at bedtime.   promethazine 25 MG tablet Commonly known as:  PHENERGAN Take 25 mg by mouth every 8 (eight) hours as needed for nausea or vomiting.   SYSTANE 0.4-0.3 % Soln Generic drug:  Polyethyl Glycol-Propyl Glycol Apply 1 drop to eye daily as needed (dry eyes).   TART CHERRY ADVANCED PO Take 5 mLs by mouth daily. Juice Concentrate- 1tsp daily   ZOVIRAX 5 % Generic drug:  acyclovir ointment Apply 1 application topically as needed (flair).       Allergies:  Allergies  Allergen Reactions  . Qvar [Beclomethasone]   . Spiriva [Tiotropium Bromide Monohydrate]       rash  . Chlorzoxazone   . Epinephrine     Raises heart rate  . Lamotrigine   . Levofloxacin   . Neurontin [Gabapentin]     arthralgia  . Protriptyline Hcl     Severe constipation  . Ropinirole Hcl     arthralgia  . Tamiflu [Oseltamivir Phosphate] Other (See Comments)    "like I was having a stroke"  . Tizanidine     panic  . Verapamil   . Zonegran  Mood swings  . Advair Diskus [Fluticasone-Salmeterol] Rash  . Baclofen Rash  . Chlorzoxazone Rash  . Levofloxacin Rash  . Protriptyline Rash  . Ropinirole Rash    Past Medical History, Surgical history, Social history, and Family History were reviewed and updated.  Review of Systems: All other 10 point review of systems is negative.   Physical Exam:  weight is 201 lb (91.2 kg). Her oral temperature is 98.5 F (36.9 C). Her blood pressure is 116/49 (abnormal) and her pulse is 68. Her respiration is 20 and oxygen saturation is 98%.   Wt Readings from Last 3 Encounters:  06/19/18 201 lb (91.2 kg)  05/15/18 199 lb (90.3 kg)  04/23/18 201 lb 12.8 oz (91.5 kg)    Ocular: Sclerae unicteric, pupils equal, round and reactive to light Ear-nose-throat: Oropharynx clear, dentition fair Lymphatic: No cervical, supraclavicular or axillary adenopathy Lungs no rales or rhonchi, good excursion bilaterally Heart regular rate and rhythm, no murmur appreciated Abd soft, nontender, positive bowel sounds, no liver or spleen tip palpated on exam, no fluid wave  MSK no focal spinal tenderness, no joint edema Neuro: non-focal, well-oriented, appropriate affect Breasts: Deferred   Lab Results  Component Value Date   WBC 4.5 06/19/2018   HGB 12.0 06/19/2018   HCT 39.0 06/19/2018   MCV 75.4 (L) 06/19/2018   PLT 252 06/19/2018   Lab Results  Component Value Date   FERRITIN 5 (L) 05/15/2018   IRON 40 (L) 05/15/2018   TIBC 478 (H) 05/15/2018   UIBC 438 05/15/2018   IRONPCTSAT 8 (L) 05/15/2018   Lab Results  Component Value Date    RETICCTPCT 1.1 04/27/2015   RBC 5.17 06/19/2018   RETICCTABS 55.1 04/27/2015   No results found for: KPAFRELGTCHN, LAMBDASER, KAPLAMBRATIO No results found for: IGGSERUM, IGA, IGMSERUM No results found for: Odetta Pink, SPEI   Chemistry      Component Value Date/Time   NA 145 05/15/2018 1523   NA 147 (H) 10/08/2017 1410   NA 141 02/17/2017 1132   K 4.3 05/15/2018 1523   K 4.6 10/08/2017 1410   K 5.3 (H) 02/17/2017 1132   CL 107 05/15/2018 1523   CL 106 10/08/2017 1410   CO2 27 05/15/2018 1523   CO2 30 10/08/2017 1410   CO2 29 02/17/2017 1132   BUN 15 05/15/2018 1523   BUN 15 10/08/2017 1410   BUN 17.8 02/17/2017 1132   CREATININE 1.10 05/15/2018 1523   CREATININE 1.0 10/08/2017 1410   CREATININE 0.8 02/17/2017 1132      Component Value Date/Time   CALCIUM 9.1 05/15/2018 1523   CALCIUM 9.0 10/08/2017 1410   CALCIUM 9.3 02/17/2017 1132   ALKPHOS 78 05/15/2018 1523   ALKPHOS 59 10/08/2017 1410   ALKPHOS 79 02/17/2017 1132   AST 30 05/15/2018 1523   AST 22 02/17/2017 1132   ALT 27 05/15/2018 1523   ALT 26 10/08/2017 1410   ALT 20 02/17/2017 1132   BILITOT 0.6 05/15/2018 1523   BILITOT 0.39 02/17/2017 1132      Impression and Plan: Ms. Snowberger is a very pleasant 67 yo caucasian female with polycythemia, JAK-2 negative. Hct today is 39%.  We will do a mini phlebotomy with replacement fluids today. She can not tolerate a full phlebotomy.  We will plan to see her back in another month for follow-up.  They will contact our office with any questions or concerns. We can certainly see her sooner if need  be.   [name], NP 7/19/201910:57 AM

## 2018-06-22 LAB — IRON AND TIBC
IRON: 32 ug/dL — AB (ref 41–142)
SATURATION RATIOS: 7 % — AB (ref 21–57)
TIBC: 458 ug/dL — AB (ref 236–444)
UIBC: 426 ug/dL

## 2018-06-22 LAB — FERRITIN: FERRITIN: 5 ng/mL — AB (ref 11–307)

## 2018-07-15 ENCOUNTER — Institutional Professional Consult (permissible substitution): Admitting: Neurology

## 2018-07-17 ENCOUNTER — Inpatient Hospital Stay: Payer: Medicare Other

## 2018-07-17 ENCOUNTER — Inpatient Hospital Stay: Payer: Medicare Other | Attending: Hematology & Oncology | Admitting: Family

## 2018-07-17 ENCOUNTER — Encounter: Payer: Self-pay | Admitting: Family

## 2018-07-17 ENCOUNTER — Other Ambulatory Visit: Payer: Self-pay

## 2018-07-17 VITALS — BP 78/58 | HR 75 | Temp 98.5°F | Resp 18 | Wt 203.0 lb

## 2018-07-17 DIAGNOSIS — Z9981 Dependence on supplemental oxygen: Secondary | ICD-10-CM | POA: Diagnosis not present

## 2018-07-17 DIAGNOSIS — Z79899 Other long term (current) drug therapy: Secondary | ICD-10-CM | POA: Insufficient documentation

## 2018-07-17 DIAGNOSIS — R002 Palpitations: Secondary | ICD-10-CM | POA: Insufficient documentation

## 2018-07-17 DIAGNOSIS — D45 Polycythemia vera: Secondary | ICD-10-CM

## 2018-07-17 DIAGNOSIS — D500 Iron deficiency anemia secondary to blood loss (chronic): Secondary | ICD-10-CM

## 2018-07-17 LAB — CBC WITH DIFFERENTIAL (CANCER CENTER ONLY)
Basophils Absolute: 0 10*3/uL (ref 0.0–0.1)
Basophils Relative: 0 %
Eosinophils Absolute: 0.1 10*3/uL (ref 0.0–0.5)
Eosinophils Relative: 2 %
HEMATOCRIT: 37.5 % (ref 34.8–46.6)
HEMOGLOBIN: 11.7 g/dL (ref 11.6–15.9)
LYMPHS ABS: 1.3 10*3/uL (ref 0.9–3.3)
LYMPHS PCT: 25 %
MCH: 23.3 pg — ABNORMAL LOW (ref 26.0–34.0)
MCHC: 31.2 g/dL — AB (ref 32.0–36.0)
MCV: 74.6 fL — ABNORMAL LOW (ref 81.0–101.0)
MONOS PCT: 11 %
Monocytes Absolute: 0.6 10*3/uL (ref 0.1–0.9)
NEUTROS ABS: 3.2 10*3/uL (ref 1.5–6.5)
NEUTROS PCT: 62 %
Platelet Count: 218 10*3/uL (ref 145–400)
RBC: 5.03 MIL/uL (ref 3.70–5.32)
RDW: 17.1 % — ABNORMAL HIGH (ref 11.1–15.7)
WBC Count: 5.2 10*3/uL (ref 3.9–10.0)

## 2018-07-17 LAB — CMP (CANCER CENTER ONLY)
ALBUMIN: 3.4 g/dL — AB (ref 3.5–5.0)
ALK PHOS: 67 U/L (ref 26–84)
ALT: 22 U/L (ref 10–47)
AST: 29 U/L (ref 11–38)
Anion gap: 8 (ref 5–15)
BILIRUBIN TOTAL: 0.6 mg/dL (ref 0.2–1.6)
BUN: 11 mg/dL (ref 7–22)
CHLORIDE: 104 mmol/L (ref 98–108)
CO2: 28 mmol/L (ref 18–33)
CREATININE: 0.9 mg/dL (ref 0.60–1.20)
Calcium: 8.8 mg/dL (ref 8.0–10.3)
Glucose, Bld: 108 mg/dL (ref 73–118)
Potassium: 4.4 mmol/L (ref 3.3–4.7)
Sodium: 140 mmol/L (ref 128–145)
Total Protein: 6.2 g/dL — ABNORMAL LOW (ref 6.4–8.1)

## 2018-07-17 NOTE — Progress Notes (Signed)
Hematology and Oncology Follow Up Visit  Natalie Hendrix [id number] 09-12-1951 67 y.o. 07/17/2018   Principle Diagnosis:  Secondary polycythemia vera- JAK2 negative  Current Therapy:   Phlebotomy to maintain hematocrit less than 38% Plavix 75 mg by mouth daily   Interim History: Natalie Hendrix is here today with her husband and service dog for follow-up. She is doing fairly well. The heat and humidity of Summer has exacerbated her migraines at times. She sees neurology later this month She is doing well on 2L supplemental O2 24 hours a day.  Hct today is stable at 37.5%.  She has angina and palpitations at times. These come and go.  No fever, chills, n/v, cough, rash, abdominal pain or changes in bowel or bladder habits.   No lymphadenopathy noted on exam. No swelling, tenderness, numbness or tingling in her extremities.  No episodes of bleeding. She does bruise easily on Plavix.  Her appetite comes and goes but she is staying well hydrated. Her weight is stable.   ECOG Performance Status: 1 - Symptomatic but completely ambulatory  Medications:  Allergies as of 07/17/2018      Reactions   Qvar [beclomethasone]    Spiriva [tiotropium Bromide Monohydrate]    rash   Chlorzoxazone    Epinephrine    Raises heart rate   Lamotrigine    Levofloxacin    Neurontin [gabapentin]    arthralgia   Protriptyline Hcl    Severe constipation   Ropinirole Hcl    arthralgia   Tamiflu [oseltamivir Phosphate] Other (See Comments)   "like I was having a stroke"   Tizanidine    panic   Verapamil    Zonegran    Mood swings   Advair Diskus [fluticasone-salmeterol] Rash   Baclofen Rash   Chlorzoxazone Rash   Levofloxacin Rash   Protriptyline Rash   Ropinirole Rash      Medication List        Accurate as of 07/17/18 11:40 AM. Always use your most recent med list.          albuterol 108 (90 Base) MCG/ACT inhaler Commonly known as:  PROVENTIL HFA;VENTOLIN HFA Inhale 2 puffs into the lungs  every 6 (six) hours as needed for wheezing or shortness of breath.   albuterol (2.5 MG/3ML) 0.083% nebulizer solution Commonly known as:  PROVENTIL Take 3 mLs (2.5 mg total) by nebulization every 6 (six) hours as needed for wheezing or shortness of breath.   azelastine 0.1 % nasal spray Commonly known as:  ASTELIN Place 1 spray into both nostrils 2 (two) times daily.   beclomethasone 42 MCG/SPRAY nasal spray Commonly known as:  BECONASE-AQ Place 2 sprays into both nostrils 2 (two) times daily. Dose is for each nostril.   benzonatate 100 MG capsule Commonly known as:  TESSALON Take 1 capsule (100 mg total) by mouth every 6 (six) hours as needed for cough.   buPROPion 300 MG 24 hr tablet Commonly known as:  WELLBUTRIN XL Take 1 tablet (300 mg total) by mouth daily with breakfast.   CALCIUM 1200 PO Take 1,250 mg by mouth daily.   CENTRUM SILVER tablet Take 1 tablet by mouth daily.   clopidogrel 75 MG tablet Commonly known as:  PLAVIX Take 1 tablet (75 mg total) by mouth daily.   co-enzyme Q-10 30 MG capsule Take 30 mg by mouth daily.   cyclobenzaprine 5 MG tablet Commonly known as:  FLEXERIL Take 5 mg by mouth 2 (two) times daily as needed for muscle  spasms. Phillips   ESTRACE VAGINAL 0.1 MG/GM vaginal cream Generic drug:  estradiol Place 1 Applicatorful vaginally daily. Small amount each dose per pt   ezetimibe 10 MG tablet Commonly known as:  ZETIA TAKE 1 TABLET BY MOUTH DAILY.   fexofenadine 180 MG tablet Commonly known as:  ALLEGRA Take 1 tablet (180 mg total) by mouth daily as needed for allergies. As needed   Fish Oil 1200 MG Caps Take 1,200 mg by mouth at bedtime.   hyoscyamine 0.125 MG Tbdp disintergrating tablet Commonly known as:  ANASPAZ Place 1 tablet (0.125 mg total) under the tongue 2 (two) times daily as needed.   lidocaine 5 % Commonly known as:  LIDODERM Place 1 patch onto the skin as needed (pain). Remove & Discard patch within 12 hours or as  directed by MD   LORazepam 1 MG tablet Commonly known as:  ATIVAN Take 1 tablet (1 mg total) by mouth 2 (two) times daily. May take extra 3rd dose if needed   magic mouthwash w/lidocaine Soln Take 5 mLs by mouth 4 (four) times daily.   magnesium gluconate 500 MG tablet Commonly known as:  MAGONATE Take 500 mg by mouth daily as needed (constipation).   meclizine 25 MG tablet Commonly known as:  ANTIVERT Take 1 tablet (25 mg total) by mouth 3 (three) times daily as needed for dizziness. 3 month supply   metoprolol succinate 50 MG 24 hr tablet Commonly known as:  TOPROL-XL Take 1 tablet (50 mg total) by mouth daily with breakfast.   morphine 15 MG tablet Commonly known as:  MSIR Take 15 mg by mouth every 4 (four) hours as needed for severe pain.   morphine 30 MG 12 hr tablet Commonly known as:  MS CONTIN Take 30 mg by mouth every 12 (twelve) hours.   Nebulizer Devi Use as directed   OXYGEN Inhale 2.5 L/hr into the lungs continuous.   pantoprazole 40 MG tablet Commonly known as:  PROTONIX TAKE 1 TABLET (40 MG TOTAL) BY MOUTH DAILY.   pravastatin 40 MG tablet Commonly known as:  PRAVACHOL Take 1 tablet (40 mg total) by mouth at bedtime.   promethazine 25 MG tablet Commonly known as:  PHENERGAN Take 25 mg by mouth every 8 (eight) hours as needed for nausea or vomiting.   SYSTANE 0.4-0.3 % Soln Generic drug:  Polyethyl Glycol-Propyl Glycol Apply 1 drop to eye daily as needed (dry eyes).   TART CHERRY ADVANCED PO Take 5 mLs by mouth daily. Juice Concentrate- 1tsp daily   ZOVIRAX 5 % Generic drug:  acyclovir ointment Apply 1 application topically as needed (flair).       Allergies:  Allergies  Allergen Reactions  . Qvar [Beclomethasone]   . Spiriva [Tiotropium Bromide Monohydrate]     rash  . Chlorzoxazone   . Epinephrine     Raises heart rate  . Lamotrigine   . Levofloxacin   . Neurontin [Gabapentin]     arthralgia  . Protriptyline Hcl     Severe  constipation  . Ropinirole Hcl     arthralgia  . Tamiflu [Oseltamivir Phosphate] Other (See Comments)    "like I was having a stroke"  . Tizanidine     panic  . Verapamil   . Zonegran     Mood swings  . Advair Diskus [Fluticasone-Salmeterol] Rash  . Baclofen Rash  . Chlorzoxazone Rash  . Levofloxacin Rash  . Protriptyline Rash  . Ropinirole Rash    Past Medical History, Surgical  history, Social history, and Family History were reviewed and updated.  Review of Systems: All other 10 point review of systems is negative.   Physical Exam:  vitals were not taken for this visit.   Wt Readings from Last 3 Encounters:  06/19/18 201 lb (91.2 kg)  05/15/18 199 lb (90.3 kg)  04/23/18 201 lb 12.8 oz (91.5 kg)    Ocular: Sclerae unicteric, pupils equal, round and reactive to light Ear-nose-throat: Oropharynx clear, dentition fair Lymphatic: No cervical, supraclavicular or axillary adenopathy Lungs no rales or rhonchi, good excursion bilaterally Heart regular rate and rhythm, no murmur appreciated Abd soft, nontender, positive bowel sounds, no liver or spleen tip palpated on exam, no fluid wave  MSK no focal spinal tenderness, no joint edema Neuro: non-focal, well-oriented, appropriate affect Breasts: Deferred   Lab Results  Component Value Date   WBC 5.2 07/17/2018   HGB 11.7 07/17/2018   HCT 37.5 07/17/2018   MCV 74.6 (L) 07/17/2018   PLT 218 07/17/2018   Lab Results  Component Value Date   FERRITIN 5 (L) 06/19/2018   IRON 32 (L) 06/19/2018   TIBC 458 (H) 06/19/2018   UIBC 426 06/19/2018   IRONPCTSAT 7 (L) 06/19/2018   Lab Results  Component Value Date   RETICCTPCT 1.1 04/27/2015   RBC 5.03 07/17/2018   RETICCTABS 55.1 04/27/2015   No results found for: KPAFRELGTCHN, LAMBDASER, KAPLAMBRATIO No results found for: IGGSERUM, IGA, IGMSERUM No results found for: Odetta Pink, SPEI   Chemistry      Component Value  Date/Time   NA 139 06/19/2018 1019   NA 147 (H) 10/08/2017 1410   NA 141 02/17/2017 1132   K 4.6 06/19/2018 1019   K 4.6 10/08/2017 1410   K 5.3 (H) 02/17/2017 1132   CL 105 06/19/2018 1019   CL 106 10/08/2017 1410   CO2 28 06/19/2018 1019   CO2 30 10/08/2017 1410   CO2 29 02/17/2017 1132   BUN 13 06/19/2018 1019   BUN 15 10/08/2017 1410   BUN 17.8 02/17/2017 1132   CREATININE 0.82 06/19/2018 1019   CREATININE 1.0 10/08/2017 1410   CREATININE 0.8 02/17/2017 1132      Component Value Date/Time   CALCIUM 9.4 06/19/2018 1019   CALCIUM 9.0 10/08/2017 1410   CALCIUM 9.3 02/17/2017 1132   ALKPHOS 73 06/19/2018 1019   ALKPHOS 59 10/08/2017 1410   ALKPHOS 79 02/17/2017 1132   AST 23 06/19/2018 1019   AST 22 02/17/2017 1132   ALT 18 06/19/2018 1019   ALT 26 10/08/2017 1410   ALT 20 02/17/2017 1132   BILITOT 0.3 06/19/2018 1019   BILITOT 0.39 02/17/2017 1132      Impression and Plan: Natalie Hendrix is a very pleasant 67 yo caucasian female with polycythemia, JAK-2 negative. Hct is 37.5% so no phlebotomy needed this visit.  We will continue to follow along and plan to see her back in another month.  She will contact our office with any questions or concerns. We can certainly see her sooner if need be.   Natalie Peace, NP 8/16/201911:40 AM

## 2018-07-20 LAB — FERRITIN: FERRITIN: 5 ng/mL — AB (ref 11–307)

## 2018-07-20 LAB — IRON AND TIBC
Iron: 57 ug/dL (ref 41–142)
SATURATION RATIOS: 13 % — AB (ref 21–57)
TIBC: 440 ug/dL (ref 236–444)
UIBC: 383 ug/dL

## 2018-08-10 ENCOUNTER — Encounter: Payer: Self-pay | Admitting: Neurology

## 2018-08-10 ENCOUNTER — Encounter

## 2018-08-10 ENCOUNTER — Ambulatory Visit (INDEPENDENT_AMBULATORY_CARE_PROVIDER_SITE_OTHER): Payer: Medicare Other | Admitting: Neurology

## 2018-08-10 ENCOUNTER — Telehealth: Payer: Self-pay | Admitting: Neurology

## 2018-08-10 ENCOUNTER — Ambulatory Visit (INDEPENDENT_AMBULATORY_CARE_PROVIDER_SITE_OTHER): Payer: Self-pay | Admitting: Neurology

## 2018-08-10 ENCOUNTER — Telehealth: Payer: Self-pay | Admitting: *Deleted

## 2018-08-10 VITALS — BP 108/62 | HR 75 | Ht 67.0 in | Wt 203.0 lb

## 2018-08-10 DIAGNOSIS — G43009 Migraine without aura, not intractable, without status migrainosus: Secondary | ICD-10-CM

## 2018-08-10 DIAGNOSIS — G459 Transient cerebral ischemic attack, unspecified: Secondary | ICD-10-CM | POA: Diagnosis not present

## 2018-08-10 DIAGNOSIS — I639 Cerebral infarction, unspecified: Secondary | ICD-10-CM

## 2018-08-10 NOTE — Patient Instructions (Signed)
I had a long discussion with the patient and her husband regarding her long-standing history of strokelike episodes as well as chronic migraine and possibly complicated migraine episodes. She's had extensive neurological evaluation and negative neurovascular workup in the past. She has some new complaints of dizziness and vertigo and hence I would like to evaluate her by checking MRA of the brain, neck and MRI scan to evaluate for vertebrobasilar ischemia. She also has long-standing ongoing neurological issues including chronic migraines, gait and balance difficulties and memory loss. She has an appointment with Dr. Jaynee Eagles  n my neurologist colleagues for follow-up for these problems soon. No scheduled follow-up appointment with me is necessary.

## 2018-08-10 NOTE — Progress Notes (Signed)
[facility] [address] [place]. [place] [address] [phone]       OFFICE CONSULT NOTE  Natalie. Natalie Hendrix Date of Birth:  [birth year] Medical Record Number:  [id number]   Referring MD:  Dr Jaynee Eagles  Reason for Referral:  Second opinion for stroke HPI: Natalie Hendrix is a 46 year Caucasian lady who is seen today upon request from Dr. Jaynee Eagles for  second opinion for stroke like episodes. She is accompanied by husband. History is obtained from them and review of available medical records and I personally reviewed her imaging studies in PACS.Marland Kitchenshe has a long-standing history of multiple medical problems including chronic migraine headaches, gait dysfunction, chronic back pain, memory loss, stroke like episodes and dizziness and syncope. She was followed by Dr. Morene Antu  In our practice until his retirement and was seen once by Dr. Rexene Alberts.She subsequently transferred to Dr. Melrose Nakayama in [place] home she saw a couple of times. She was then seen by Dr. Metta Clines a couple of times. Patient wants [place] opinion for her stroke like episodes. She states that the first episode was 1999 when she had an outpatient that dilatation and curettage procedure. Following this procedure she could not talk for a long time almost a year. She has some trouble speaking and expressing herself. She saw Dr. Erling Cruz at that time an MRI scan did not show an acute stroke. She did have some headaches at that time as well and she was also diagnosed with polycythemia and hematocrit was elevated at 60. She has subsequently been falling Dr. Theodis Blaze and has been asked to keep the hematocrit below 38 and she gets phlebotomies with his about it. She says she had multiple episodes when most of the time she has a migraine headache and then she has trouble speaking and may also have blurred vision as well as dizziness. Her last episode occurred on 05/12/17 when she was seen in the ER by Drs. Zackowski for slurred speech, weakness and  headache. MRI scan of the brain was obtained which have personally reviewed showed only mild changes of chronic macrovascular ischemia without acute abnormality. She had hematocrit of 39 and was treated with phlebotomy. Patient denies traditional stroke risk factors except mild hypertension hyperlipidemia. She has had no recent brain imaging studies of stroke related lab work done. Patient states in the last couple of years she has had more problems with dizziness. This at times is positional when she looks up. She has history of thoracic outlet syndrome and has undergone Surgery for that on the right. She states she also has a mild case and the left side but for which surgery has not been done. She does not recall any recent vascular imaging studies of her brain or neck blood vessels. She has also long-standing memory loss and her Mini-Mental status exam score is documented as 20/30 in Dr. Georgie Chard last office note from 2015. She also complains of her left leg giving out at times and she having gait and balance problems which also appeared to be intermittent. She has an appointment to see Dr. Fenton Malling neurologist in office soon. Patient set states that her strokelike episodes do not necessarily occur with her migraine headaches but they're often accompanied. She denies also specific relationship between the hematocrit being evaluated or been dehydrated and having these episodes. She denies any relationship of stress precipitating these events. She however has not had any strokelike episode now for more than a year. She denies specifically hemiplegia, focal extremity  weakness numbness with these episodes. She does feel that at times her eyes were crossed and she may have some trouble seeing but denies diplopia  ROS:   14 system review of systems is positive for  Fever, chest pain, palpitations, leg swelling, vertigo, trouble swallowing, itching, skin moles, eye pain, blurred vision, double vision, shortness of  breath, anemia, bruising, bleeding, increased stress, flushing, joint pain and swelling, aching muscles, allergies, runny nose, skin sensitivity, memory loss, headache, numbness, weakness, dizziness, tremor, insomnia, restless legs, not enough sleep, change in appetite, disinterest in activities and all other systems negative  PMH:  Past Medical History:  Diagnosis Date  . Allergic rhinitis   . Allergy   . Anxiety   . Asthma    uses oxygen 2.5 l/m 24/7, sleeps elevated  . Complication of anesthesia    very sensitive to sedatives, B/P drops  . CVA (cerebral vascular accident) ([place]) 1999, 2014   residual left side weakness, dysphagia, word finding difficulty, and short term memory; uses mobile chair, cane  . Dependence on supplemental oxygen   . Depression   . Dizziness   . Fibromyalgia   . Fluttering heart   . GERD (gastroesophageal reflux disease)   . Hyperlipidemia   . Hypertension   . Migraine   . Migraine headache   . Migraine, unspecified, not intractable, without status migrainosus   . Other amnesia   . Polycythemia rubra vera ([place])   . Radiculopathy, site unspecified   . Thoracic outlet syndrome 1981   repair  . TIA (transient ischemic attack)   . Unspecified chronic bronchitis ([place])   . Unspecified dementia without behavioral disturbance   . Vertigo     Social History:  Social History   Socioeconomic History  . Marital status: Married    Spouse name: Not on file  . Number of children: N  . Years of education: Not on file  . Highest education level: Master's degree (e.g., MA, Natalie, MEng, MEd, MSW, MBA)  Occupational History  . Occupation: RETIRED    Employer: RETIRED    Comment: occupational hand therapist  Social Needs  . Financial resource strain: Not on file  . Food insecurity:    Worry: Not on file    Inability: Not on file  . Transportation needs:    Medical: Not on file    Non-medical: Not on file  Tobacco Use  . Smoking status: Never Smoker  .  Smokeless tobacco: Never Used  . Tobacco comment: never used tobacco; secondary smoke exposure  Substance and Sexual Activity  . Alcohol use: Not Currently    Alcohol/week: 0.0 standard drinks    Comment: seldom, wine @ christmas  . Drug use: No  . Sexual activity: Not on file  Lifestyle  . Physical activity:    Days per week: Not on file    Minutes per session: Not on file  . Stress: Not on file  Relationships  . Social connections:    Talks on phone: Not on file    Gets together: Not on file    Attends religious service: Not on file    Active member of club or organization: Not on file    Attends meetings of clubs or organizations: Not on file    Relationship status: Not on file  . Intimate partner violence:    Fear of current or ex partner: Not on file    Emotionally abused: Not on file    Physically abused: Not on file  Forced sexual activity: Not on file  Other Topics Concern  . Not on file  Social History Narrative   Lives at home with husband and service dog   Right handed   Caffeine: tries to avoid     Medications:   Current Outpatient Medications on File Prior to Visit  Medication Sig Dispense Refill  . albuterol (PROAIR HFA) 108 (90 Base) MCG/ACT inhaler Inhale 2 puffs into the lungs every 6 (six) hours as needed for wheezing or shortness of breath. 3 Inhaler 3  . albuterol (PROVENTIL) (2.5 MG/3ML) 0.083% nebulizer solution Take 3 mLs (2.5 mg total) by nebulization every 6 (six) hours as needed for wheezing or shortness of breath. 1080 mL 1  . azelastine (ASTELIN) 0.1 % nasal spray Place 1 spray into both nostrils 2 (two) times daily. 90 mL 3  . beclomethasone (BECONASE-AQ) 42 MCG/SPRAY nasal spray Place 2 sprays into both nostrils 2 (two) times daily. Dose is for each nostril. 75 g 2  . benzonatate (TESSALON) 100 MG capsule Take 1 capsule (100 mg total) by mouth every 6 (six) hours as needed for cough. 360 capsule 3  . buPROPion (WELLBUTRIN XL) 300 MG 24 hr  tablet Take 1 tablet (300 mg total) by mouth daily with breakfast. 90 tablet 3  . Calcium Carbonate-Vit D-Min (CALCIUM 1200 PO) Take 1,250 mg by mouth daily.     . clopidogrel (PLAVIX) 75 MG tablet Take 1 tablet (75 mg total) by mouth daily. 90 tablet 3  . cyclobenzaprine (FLEXERIL) 5 MG tablet Take 5 mg by mouth 2 (two) times daily as needed for muscle spasms. Phillips  0  . ESTRACE VAGINAL 0.1 MG/GM vaginal cream Place 1 Applicatorful vaginally daily. Small amount each dose per pt    . ezetimibe (ZETIA) 10 MG tablet TAKE 1 TABLET BY MOUTH DAILY. 90 tablet 3  . hyoscyamine (ANASPAZ) 0.125 MG TBDP disintergrating tablet Place 1 tablet (0.125 mg total) under the tongue 2 (two) times daily as needed. (Patient taking differently: Place 0.125 mg under the tongue 2 (two) times daily as needed for bladder spasms or cramping. ) 180 tablet 0  . lidocaine (LIDODERM) 5 % Place 1 patch onto the skin as needed (pain). Remove & Discard patch within 12 hours or as directed by MD    . LORazepam (ATIVAN) 1 MG tablet Take 1 tablet (1 mg total) by mouth 2 (two) times daily. May take extra 3rd dose if needed 75 tablet 0  . magnesium gluconate (MAGONATE) 500 MG tablet Take 500 mg by mouth daily as needed (constipation).     . meclizine (ANTIVERT) 25 MG tablet Take 1 tablet (25 mg total) by mouth 3 (three) times daily as needed for dizziness. 3 month supply (Patient taking differently: Take 25 mg by mouth 3 (three) times daily. 3 month supply) 60 tablet 0  . metoprolol succinate (TOPROL-XL) 50 MG 24 hr tablet Take 1 tablet (50 mg total) by mouth daily with breakfast. 90 tablet 1  . Misc Natural Products (TART CHERRY ADVANCED PO) Take 5 mLs by mouth daily. Juice Concentrate- 1tsp daily    . morphine (Natalie CONTIN) 30 MG 12 hr tablet Take 30 mg by mouth every 12 (twelve) hours.   0  . morphine (MSIR) 15 MG tablet Take 15 mg by mouth every 4 (four) hours as needed for severe pain.    . Multiple Vitamins-Minerals (CENTRUM  SILVER) tablet Take 1 tablet by mouth daily.    . Omega-3 Fatty Acids (FISH OIL)  1200 MG CAPS Take 1,200 mg by mouth at bedtime.     . OXYGEN Inhale 2.5 L/hr into the lungs continuous.    . pantoprazole (PROTONIX) 40 MG tablet TAKE 1 TABLET (40 MG TOTAL) BY MOUTH DAILY. 90 tablet 3  . Polyethyl Glycol-Propyl Glycol (SYSTANE) 0.4-0.3 % SOLN Apply 1 drop to eye daily as needed (dry eyes).     . pravastatin (PRAVACHOL) 40 MG tablet Take 1 tablet (40 mg total) by mouth at bedtime. 90 tablet 3  . promethazine (PHENERGAN) 25 MG tablet Take 25 mg by mouth every 8 (eight) hours as needed for nausea or vomiting.   1  . Respiratory Therapy Supplies (NEBULIZER) DEVI Use as directed 1 each 0  . ZOVIRAX 5 % Apply 1 application topically as needed (flair).     . fexofenadine (ALLEGRA) 180 MG tablet Take 1 tablet (180 mg total) by mouth daily as needed for allergies. As needed 90 tablet 3   Current Facility-Administered Medications on File Prior to Visit  Medication Dose Route Frequency Provider Last Rate Last Dose  . 0.9 %  sodium chloride infusion  1,000 mL Intravenous Once Sumner, NP        Allergies:   Allergies  Allergen Reactions  . Qvar [Beclomethasone]   . Spiriva [Tiotropium Bromide Monohydrate]     rash  . Advair Diskus [Fluticasone-Salmeterol]   . Chlorzoxazone   . Epinephrine     Raises heart rate  . Lamotrigine   . Levofloxacin   . Neurontin [Gabapentin]     arthralgia  . Protriptyline Hcl     Severe constipation  . Ropinirole Hcl     arthralgia  . Tamiflu [Oseltamivir Phosphate] Other (See Comments)    "like I was having a stroke"  . Tizanidine     panic  . Verapamil   . Zonegran     Mood swings  . Advair Diskus [Fluticasone-Salmeterol] Rash  . Baclofen Rash  . Chlorzoxazone Rash  . Levofloxacin Rash  . Protriptyline Rash  . Ropinirole Rash    Physical Exam General: frail middle-age Caucasian lady, seated, in no evident distress.she is on home  oxygen. Head: head normocephalic and atraumatic.   Neck: supple with no carotid or supraclavicular bruits Cardiovascular: regular rate and rhythm, no murmurs Musculoskeletal: no deformity Skin:  no rash/petichiae Vascular:  Normal pulses all extremities  Neurologic Exam Mental Status: Awake and fully alert. Oriented to place and time. Recent and remote memory poor. Attention span, concentration and fund of knowledge diminished poor recall 0/3. Able to name  10 animals but forelegs. Clock drawing 3/4.Marland Kitchen Mood and affect appropriate.  Cranial Nerves: Fundoscopic exam reveals sharp disc margins. Pupils equal, briskly reactive to light. Extraocular movements full without nystagmus. Visual fields full to confrontation. Hearing intact. Facial sensation intact. Face, tongue, palate moves normally and symmetrically.  Motor: Normal bulk and tone. Normal strength in all tested extremity muscles.intermittent occasional action tremor of outstretched approximately is. No cogwheel rigidity or bradykinesia. Sensory.: intact to touch , pinprick , position and vibratory sensation. Unable to stand on a narrow base unupported Coordination: Rapid alternating movements normal in all extremities. Finger-to-nose and heel-to-shin performed accurately bilaterally. Gait and Station: Arises from chair with some difficulty. Stance is wide-basedl. Gait is bizarre with occasionally missing a step and almost falling. . Unable to walk tandem Reflexes: 1+ and symmetric. Toes downgoing.       ASSESSMENT: 65 year Caucasian lady with long-standing history of strokel ike episodes, chronic migraines,  tremor, vertigo, memory loss, gait dysfunction and polycythemia vera who has  had negative neurovascular evaluation in the past with more recent concerns for worsening positional vertigo raising concern for possible vertebrobasilar ischemia.is unclear whether this strokelike episodes are related to her polycythemia and increased hematocrit  or to migraine.     PLAN: I had a long discussion with the patient and her husband regarding her long-standing history of strokelike episodes as well as chronic migraine and possibly complicated migraine episodes. She's had extensive neurological evaluation and negative neurovascular workup in the past. She has some new complaints of dizziness and vertigo and hence I would like to evaluate her by checking MRA of the brain, neck and MRI scan to evaluate for vertebrobasilar ischemia. She also has long-standing ongoing neurological issues including chronic migraines, gait and balance difficulties and memory loss. She has an appointment with Dr. Jaynee Eagles  n my neurologist colleagues for follow-up for these problems soon. Greater than 50% time during this 50 minute consultation visit s spent on counseling and coordination of care about her stroke like episodes, migraine, dizziness and answering questions.No scheduled follow-up appointment with me is necessary. Antony Contras, MD  [facility] [address] [place], [place] [address]  Phone [phone] Fax [phone]  Note: This document was prepared with digital dictation and possible smart phrase technology. Any transcriptional errors that result from this process are unintentional.

## 2018-08-10 NOTE — Telephone Encounter (Signed)
[employer] Josem Kaufmann: [id number] (exp. 08/10/18 to 09/08/18) order sent to [facility]. They will reach out to the pt to schedule.

## 2018-08-10 NOTE — Telephone Encounter (Signed)
Pt requests that any 90 day supply medications be faxed to:  [place] [address] Fax: [phone]  Prescription should include pt's name & address, DOB, Last 4 of SS # 1302, and Sponsor # [id number]  Electronic # [phone] Office # [phone]  Pt reports it takes 2-3 to process and receive medication.

## 2018-08-11 NOTE — Progress Notes (Signed)
Patient saw Dr Leonie Man

## 2018-08-17 ENCOUNTER — Ambulatory Visit: Payer: Medicare Other | Admitting: Family

## 2018-08-17 ENCOUNTER — Inpatient Hospital Stay: Payer: Medicare Other | Attending: Hematology & Oncology

## 2018-08-17 ENCOUNTER — Inpatient Hospital Stay: Payer: Medicare Other

## 2018-08-17 VITALS — BP 104/37 | HR 73 | Temp 98.3°F | Resp 19

## 2018-08-17 DIAGNOSIS — Z79899 Other long term (current) drug therapy: Secondary | ICD-10-CM | POA: Insufficient documentation

## 2018-08-17 DIAGNOSIS — D45 Polycythemia vera: Secondary | ICD-10-CM

## 2018-08-17 DIAGNOSIS — D751 Secondary polycythemia: Secondary | ICD-10-CM | POA: Diagnosis not present

## 2018-08-17 DIAGNOSIS — D509 Iron deficiency anemia, unspecified: Secondary | ICD-10-CM | POA: Diagnosis not present

## 2018-08-17 DIAGNOSIS — R5383 Other fatigue: Secondary | ICD-10-CM | POA: Diagnosis not present

## 2018-08-17 DIAGNOSIS — D500 Iron deficiency anemia secondary to blood loss (chronic): Secondary | ICD-10-CM

## 2018-08-17 LAB — CBC WITH DIFFERENTIAL (CANCER CENTER ONLY)
Basophils Absolute: 0 10*3/uL (ref 0.0–0.1)
Basophils Relative: 0 %
EOS ABS: 0.1 10*3/uL (ref 0.0–0.5)
Eosinophils Relative: 3 %
HCT: 40.8 % (ref 34.8–46.6)
Hemoglobin: 12.5 g/dL (ref 11.6–15.9)
LYMPHS ABS: 1.5 10*3/uL (ref 0.9–3.3)
LYMPHS PCT: 30 %
MCH: 23.7 pg — ABNORMAL LOW (ref 26.0–34.0)
MCHC: 30.6 g/dL — ABNORMAL LOW (ref 32.0–36.0)
MCV: 77.3 fL — AB (ref 81.0–101.0)
MONOS PCT: 13 %
Monocytes Absolute: 0.7 10*3/uL (ref 0.1–0.9)
NEUTROS PCT: 54 %
Neutro Abs: 2.8 10*3/uL (ref 1.5–6.5)
PLATELETS: 230 10*3/uL (ref 145–400)
RBC: 5.28 MIL/uL (ref 3.70–5.32)
RDW: 17.4 % — AB (ref 11.1–15.7)
WBC: 5.1 10*3/uL (ref 3.9–10.0)

## 2018-08-17 LAB — CMP (CANCER CENTER ONLY)
ALT: 25 U/L (ref 10–47)
ANION GAP: 1 — AB (ref 5–15)
AST: 30 U/L (ref 11–38)
Albumin: 3.6 g/dL (ref 3.5–5.0)
Alkaline Phosphatase: 70 U/L (ref 26–84)
BUN: 11 mg/dL (ref 7–22)
CALCIUM: 9.3 mg/dL (ref 8.0–10.3)
CO2: 31 mmol/L (ref 18–33)
Chloride: 109 mmol/L — ABNORMAL HIGH (ref 98–108)
Creatinine: 1.1 mg/dL (ref 0.60–1.20)
GLUCOSE: 113 mg/dL (ref 73–118)
Potassium: 4.1 mmol/L (ref 3.3–4.7)
Sodium: 141 mmol/L (ref 128–145)
TOTAL PROTEIN: 6.5 g/dL (ref 6.4–8.1)
Total Bilirubin: 0.5 mg/dL (ref 0.2–1.6)

## 2018-08-17 MED ORDER — SODIUM CHLORIDE 0.9 % IV SOLN
Freq: Once | INTRAVENOUS | Status: AC
  Administered 2018-08-17: 12:00:00 via INTRAVENOUS
  Filled 2018-08-17: qty 250

## 2018-08-17 NOTE — Progress Notes (Signed)
[name] presents today for phlebotomy per MD orders. Phlebotomy procedure started at 1202 and ended at 1220 via 20 ga angio cath to right posterior upper forearm. 520 grams removed.  At 1220 NS started at 500 ml/hr for 500 ml.'s per order of Dr. Marin Olp Patient tolerated procedure well. IV needle removed intact.  Pressure dressing clean, dry and intact to right arm at time of discharge.

## 2018-08-17 NOTE — Patient Instructions (Signed)

## 2018-08-18 LAB — FERRITIN: Ferritin: <4 ng/mL — ABNORMAL LOW (ref 11–307)

## 2018-08-18 LAB — IRON AND TIBC
Iron: 38 ug/dL — ABNORMAL LOW (ref 41–142)
SATURATION RATIOS: 8 % — AB (ref 21–57)
TIBC: 450 ug/dL — AB (ref 236–444)
UIBC: 412 ug/dL

## 2018-08-26 ENCOUNTER — Ambulatory Visit
Admission: RE | Admit: 2018-08-26 | Discharge: 2018-08-26 | Disposition: A | Discharge status: Home / Self Care | Payer: Medicare Other | Source: Ambulatory Visit | Attending: Neurology | Admitting: Neurology

## 2018-08-26 DIAGNOSIS — G459 Transient cerebral ischemic attack, unspecified: Secondary | ICD-10-CM

## 2018-08-26 MED ORDER — GADOBENATE DIMEGLUMINE 529 MG/ML IV SOLN
19.0000 mL | Freq: Once | INTRAVENOUS | Status: AC | PRN
  Administered 2018-08-26: 19 mL via INTRAVENOUS

## 2018-08-27 ENCOUNTER — Encounter: Payer: Self-pay | Admitting: Neurology

## 2018-08-27 ENCOUNTER — Ambulatory Visit (INDEPENDENT_AMBULATORY_CARE_PROVIDER_SITE_OTHER): Payer: Medicare Other | Admitting: Neurology

## 2018-08-27 VITALS — BP 112/69 | HR 75 | Ht 67.0 in | Wt 200.0 lb

## 2018-08-27 DIAGNOSIS — G43711 Chronic migraine without aura, intractable, with status migrainosus: Secondary | ICD-10-CM | POA: Diagnosis not present

## 2018-08-27 NOTE — Patient Instructions (Addendum)
I recommend the CGRP medications Aimovig, Ajovy or Erenumab. They are monthly injections.   Erenumab: Patient drug information [employer] here. Copyright [phone] [employer] All rights reserved. (For additional information see "Erenumab: Drug information") Brand Names: [place]  Aimovig  Brand Names: [place]  Aimovig  What is this drug used for?   It is used to prevent migraine headaches.  What do I need to tell my doctor BEFORE I take this drug?   If you have an allergy to this drug or any part of this drug.   If you are allergic to any drugs like this one, any other drugs, foods, or other substances. Tell your doctor about the allergy and what signs you had, like rash; hives; itching; shortness of breath; wheezing; cough; swelling of face, lips, tongue, or throat; or any other signs.   This drug may interact with other drugs or health problems.   Tell your doctor and pharmacist about all of your drugs (prescription or OTC, natural products, vitamins) and health problems. You must check to make sure that it is safe for you to take this drug with all of your drugs and health problems. Do not start, stop, or change the dose of any drug without checking with your doctor.  What are some things I need to know or do while I take this drug?   Tell all of your health care providers that you take this drug. This includes your doctors, nurses, pharmacists, and dentists.   If you have a latex allergy, talk with your doctor.   Tell your doctor if you are pregnant or plan on getting pregnant. You will need to talk about the benefits and risks of using this drug while you are pregnant.   Tell your doctor if you are breast-feeding. You will need to talk about any risks to your baby.  What are some side effects that I need to call my doctor about right away?   WARNING/CAUTION: Even though it may be rare, some people may have very bad and sometimes deadly side effects when taking a drug. Tell  your doctor or get medical help right away if you have any of the following signs or symptoms that may be related to a very bad side effect:   Signs of an allergic reaction, like rash; hives; itching; red, swollen, blistered, or peeling skin with or without fever; wheezing; tightness in the chest or throat; trouble breathing, swallowing, or talking; unusual hoarseness; or swelling of the mouth, face, lips, tongue, or throat.  What are some other side effects of this drug?   All drugs may cause side effects. However, many people have no side effects or only have minor side effects. Call your doctor or get medical help if any of these side effects or any other side effects bother you or do not go away:   Redness or swelling where the shot is given.   Pain where the shot was given.   Constipation.   These are not all of the side effects that may occur. If you have questions about side effects, call your doctor. Call your doctor for medical advice about side effects.   You may report side effects to your national health agency.  How is this drug best taken?   Use this drug as ordered by your doctor. Read all information given to you. Follow all instructions closely.   It is given as a shot into the fatty part of the skin on the top  of the thigh, belly area, or upper arm.   If you will be giving yourself the shot, your doctor or nurse will teach you how to give the shot.   Follow how to use as you have been told by the doctor or read the package insert.   If stored in a refrigerator, let this drug come to room temperature before using it. Leave it at room temperature for at least 30 minutes. Do not heat this drug.   Protect from heat and sunlight.   Do not shake.   Do not give into skin that is irritated, bruised, red, infected, or scarred.   Do not use if the solution is cloudy, leaking, or has particles.   Do not use if solution changes color.   Throw away after using. Do not use the device more  than 1 time.   Throw away needles in a needle/sharp disposal box. Do not reuse needles or other items. When the box is full, follow all local rules for getting rid of it. Talk with a doctor or pharmacist if you have any questions.  What do I do if I miss a dose?   Take a missed dose as soon as you think about it.   After taking a missed dose, start a new schedule based on when the dose is taken.  How do I store and/or throw out this drug?   Store in a refrigerator. Do not freeze.   Store in the carton to protect from light.   Do not use if it has been frozen.   If you drop this drug on a hard surface, do not use it.   If needed, you may store at room temperature for up to 7 days. Write down the date you take this drug out of the refrigerator. If stored at room temperature and not used within 7 days, throw this drug away.   Do not put this drug back in the refrigerator after it has been stored at room temperature.   Keep all drugs in a safe place. Keep all drugs out of the reach of children and pets.   Throw away unused or expired drugs. Do not flush down a toilet or pour down a drain unless you are told to do so. Check with your pharmacist if you have questions about the best way to throw out drugs. There may be drug take-back programs in your area.  General drug facts   If your symptoms or health problems do not get better or if they become worse, call your doctor.   Do not share your drugs with others and do not take anyone else's drugs.   Keep a list of all your drugs (prescription, natural products, vitamins, OTC) with you. Give this list to your doctor.   Talk with the doctor before starting any new drug, including prescription or OTC, natural products, or vitamins.   Some drugs may have another patient information leaflet. If you have any questions about this drug, please talk with your doctor, nurse, pharmacist, or other health care provider.   If you think there has been an overdose,  call your poison control center or get medical care right away. Be ready to tell or show what was taken, how much, and when it happened.  Use of UpToDate is subject to the [employer] and [place]

## 2018-08-27 NOTE — Progress Notes (Signed)
[facility]    Provider:  Dr Jaynee Eagles Referring Provider: Remi Haggard, FNP Primary Care Physician:  Remi Haggard, FNP  CC:  Headaches  HPI:  Natalie Hendrix is a 67 y.o. female here for headaches and migraines. She has a very complicated medical history as well as long history of headaches and migraines and she has been seen by multiple neurologists including ones here in [place] and at [place] and [facility]..  She's had extensive neurological evaluation and negative neurovascular workup in the past but reports multiple strokes. She has an otherwise extensive history including chronic respiratory failure with hypoxia, dependence on supplemental oxygen, depression, fibromyalgia, hyperlipidemia, hypertension, migraines, polycythemia vera, radiculopathy, remote history thoracic outlet syndrome which was repaired, TIA or CVA, chronic bronchitis and asthma, dementia, vertigo, dizziness, radiculopathy, anxiety, prediabetes, obesity, chronic gait and balance difficulties.  I also found references to patient having tremor, foreign accent syndrome and depression in particular after losing her son, falls and blackout spells, orthostatic hypotension, vertigo.  She sees Dr. Nicholaus Bloom for pain management.    She is here with her husband who provides much information pertinent to the visit today and much information not pertinent or related to our appointment and is difficult to redirect. She reports headache/migraines for approx 20 years since 1999.  She has chronic daily headaches. She has vertigo and chronic pain. Daily headaches and at least 20 headache days each month are migrainous for years. No OTC medication. No aura. No medication overuse. She has had biofeedback in the past for her migraines.Medication overuse headache has been discussed with her in the past from opioids, patient denies this.  She is tried and failed multiple first-line, second line and third line headache  medication classes.  Will call Dr. Marin Olp, [name]  Migraine/headach Medications tried in the past: Baclofen, Inderal, Elavil, Toradol, Zomig, Propranolol, Depakote, Lamictal, Gabapentin, Nortriptyline, Protriptyline, tizanidine verapamil, meclizine, metoprolol, flexeril, magnesium, phenergan, zonegran,  and per patient multiple other migraine preventatives throughout the years    I reviewed significant history from previous neurologists and reviewed the following with patient and husband:  Stroke-like events, MRI of the brain does not show CVA, per husband she had a brainstem stroke that cannot be seen on MRI and since then she has had difficulty swallowing, also left-sided weakness.  She used to follow with Dr. Erling Cruz in the past. She recently saw Dr. Leonie Man for her hx of TIAs, stroke-like events. He did not require a follow up and suggested her stroke-like episodes may be complicated migraines. Recent imaging was stable and unremarkable including MRI brain, MRA of the head and neck. I will request Dr. Tressia Danas notes.    Gait abnormalities:   She has been seen by multiple neurologist in the past including Dr. Rexene Alberts in our office, Dr. love in our office, Dr. Melrose Nakayama at [facility].  She has a history of L5-S1 radiculopathy and Fibromyalgia causing back and leg pain contributing to falls. Dr. Rexene Alberts stated her findings were stable as compared to dr. Tressia Danas prior exam which was significant for mildly impaired attention language memory, normal bulk strength and tone, no drift tremor or rebound, Romberg is mildly positive, fine motor skills are fairly well-preserved with some deliberate movements, no dysmetria or intention tremor on finger-to-nose testing, no truncal or gait ataxia, she stands with mild difficulty and needs to push herself up, no veering to one side is noted, no leaning to one side, posture is age-appropriate and stance is mildly based, she walks  with a rolling walker no problems turning are noted.   Otherwise exam unremarkable.  No parkinsonism on exam.  Dr. Rexene Alberts suggested no new tests needed, continue with maintaining a healthy lifestyle, exercise. 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  Memory: Per Dr Tomi Likens who saw her in 2014 and 2015, In October 2012 her MMSE was 20.  In October 2013 her MMSE was 21.  In February 2014 her MMSE was 21.She has anxiety and depression. She needs assistance for her ADLs from her husband, she cannot cook for herself or administer her medication. She had a formal neuropsych report but unclear where.  Syncopal events: She has had multiple syncopal events for years where she has dizziness followed by fall and loss of consciousness for less than a minute.  No convulsions, tongue biting or urinary or bowel incontinence.  She does carry a diagnosis of orthostatic hypotension and takes a beta-blocker for tachycardia.  Episodes of spinning and nystagmus has been observed.  No reported tinnitus.  She was recommended to wear compression stockings.  She has seen Dr. Erik Obey for vertigo.  Dysphagia, dysarthria and aphasia: She has difficulty and choking sensations Dr. Jeneen Rinks love entertain the idea it may be related to tardive dyskinesias.  She has had swallowing studies in the past.  She also has a history of speech difficulty, word finding difficulties, poorly articulated speech.  Reviewed notes, labs and imaging from outside physicians, which showed:  Personally reviewed imaging and agree with the following: 08/2014 MRI brain (with and without) demonstrating: - Few stable, scattered periventricular, subcortical and juxtacortical foci of non-specific T2 hyperintensities. - No acute findings. - No change from 05/12/17.  MRA neck (with and without) demonstrating: -No significant stenosis or occlusion of bilateral internal carotid arteries. -Bilateral vertebral artery origins are poorly visualized, likely due  to technical artifact. There is robust, normal appearing flow signal superiorly to the verterbro-basilar junctions.  MRA Head normal  Review of Systems: Patient complains of symptoms per HPI as well as the following symptoms: Fatigue, fever, eye itching, eye redness, light sensitivity, double vision, wheezing, shortness of breath, chest tightness, chest pain, leg swelling, palpitations, heat intolerance, flushing, nausea, restless leg, insomnia, daytime sleepiness due to anemia, incontinence of bladder, joint pain, back pain, aching muscles, muscle cramps, walking difficulty, neck pain, neck stiffness, memory loss, dizziness, headache, numbness, bruising bleeding easily, speech difficulty, weakness, tremors, confusion, depression, anxiety. Pertinent negatives and positives per HPI. All others negative.   Social History   Socioeconomic History  . Marital status: Married    Spouse name: Not on file  . Number of children: N  . Years of education: Not on file  . Highest education level: Master's degree (e.g., MA, MS, MEng, MEd, MSW, MBA)  Occupational History  . Occupation: RETIRED    Employer: RETIRED    Comment: occupational hand therapist  Social Needs  . Financial resource strain: Not on file  . Food insecurity:    Worry: Not on file    Inability: Not on file  . Transportation needs:    Medical: Not on file    Non-medical: Not on file  Tobacco Use  . Smoking status: Never Smoker  . Smokeless tobacco: Never Used  . Tobacco comment: never used tobacco; secondary smoke exposure  Substance and Sexual Activity  . Alcohol use: Not Currently    Alcohol/week: 0.0 standard drinks    Comment: seldom, wine @  christmas  . Drug use: No  . Sexual activity: Not on file  Lifestyle  . Physical activity:    Days per week: Not on file    Minutes per session: Not on file  . Stress: Not on file  Relationships  . Social connections:    Talks on phone: Not on file    Gets together: Not on file     Attends religious service: Not on file    Active member of club or organization: Not on file    Attends meetings of clubs or organizations: Not on file    Relationship status: Not on file  . Intimate partner violence:    Fear of current or ex partner: Not on file    Emotionally abused: Not on file    Physically abused: Not on file    Forced sexual activity: Not on file  Other Topics Concern  . Not on file  Social History Narrative   Lives at home with husband and service dog   Right handed   Caffeine: tries to avoid     Family History  Problem Relation Age of Onset  . Cervical cancer Mother   . Heart disease Mother   . Hypertension Mother   . Cancer Mother   . Clotting disorder Mother        and aunts x 2  . Stroke Other        aunt  . Hypertension Other        aunt  . Stroke Other        uncle  . Heart attack Other        uncle  . Emphysema Other        aunt  . Emphysema Father   . Other Father        "BP"  . Lung cancer Father   . Skin cancer Father   . Allergies Unknown        aunt and sister  . Rheum arthritis Unknown        grandmother  . Other Other        polycythemia possible in 2 aunts  . Other Other        aunt with brain tumor    Past Medical History:  Diagnosis Date  . Allergic rhinitis   . Allergy   . Anxiety   . Asthma    uses oxygen 2.5 l/m 24/7, sleeps elevated  . Complication of anesthesia    very sensitive to sedatives, B/P drops  . CVA (cerebral vascular accident) ([place]) 1999, 2014   residual left side weakness, dysphagia, word finding difficulty, and short term memory; uses mobile chair, cane  . Dependence on supplemental oxygen   . Depression   . Dizziness   . Fibromyalgia   . Fluttering heart   . GERD (gastroesophageal reflux disease)   . Hyperlipidemia   . Hypertension   . Migraine   . Migraine headache   . Migraine, unspecified, not intractable, without status migrainosus   . Other amnesia   . Polycythemia rubra vera  ([place])   . Radiculopathy, site unspecified   . Thoracic outlet syndrome 1981   repair  . TIA (transient ischemic attack)   . Unspecified chronic bronchitis ([place])   . Unspecified dementia without behavioral disturbance   . Vertigo     Past Surgical History:  Procedure Laterality Date  . APPENDECTOMY    . BALLOON DILATION N/A 10/27/2013   Procedure: BALLOON DILATION;  Surgeon: Arta Silence, MD;  Location: [facility];  Service: Endoscopy;  Laterality: N/A;  . BREAST SURGERY  1979  . CHOLECYSTECTOMY    . CYST REMOVAL HAND Bilateral    left done 10-13-13(Dr. Gramig)  . DILATION AND CURETTAGE OF UTERUS    . ESOPHAGOGASTRODUODENOSCOPY (EGD) WITH PROPOFOL N/A 10/27/2013   Procedure: ESOPHAGOGASTRODUODENOSCOPY (EGD) WITH PROPOFOL;  Surgeon: Arta Silence, MD;  Location: [facility];  Service: Endoscopy;  Laterality: N/A;  . GALLBLADDER SURGERY    . KNEE SURGERY     repair torn ligament/capsule knee cruciat  . REDUCTION MAMMAPLASTY    . RESECTION RIB PARTIAL    . RHINOPLASTY    . Root Resection and Revascularization  1980   of Long thoracic artery  . scalenectomy    . synonectomy      Current Outpatient Medications  Medication Sig Dispense Refill  . albuterol (PROAIR HFA) 108 (90 Base) MCG/ACT inhaler Inhale 2 puffs into the lungs every 6 (six) hours as needed for wheezing or shortness of breath. 3 Inhaler 3  . albuterol (PROVENTIL) (2.5 MG/3ML) 0.083% nebulizer solution Take 3 mLs (2.5 mg total) by nebulization every 6 (six) hours as needed for wheezing or shortness of breath. 1080 mL 1  . azelastine (ASTELIN) 0.1 % nasal spray Place 1 spray into both nostrils 2 (two) times daily. 90 mL 3  . beclomethasone (BECONASE-AQ) 42 MCG/SPRAY nasal spray Place 2 sprays into both nostrils 2 (two) times daily. Dose is for each nostril. 75 g 2  . benzonatate (TESSALON) 100 MG capsule Take 1 capsule (100 mg total) by mouth every 6 (six) hours as needed for cough. 360 capsule 3  . buPROPion  (WELLBUTRIN XL) 300 MG 24 hr tablet Take 1 tablet (300 mg total) by mouth daily with breakfast. 90 tablet 3  . Calcium Carbonate-Vit D-Min (CALCIUM 1200 PO) Take 1,250 mg by mouth daily.     . clopidogrel (PLAVIX) 75 MG tablet Take 1 tablet (75 mg total) by mouth daily. 90 tablet 3  . cyclobenzaprine (FLEXERIL) 5 MG tablet Take 5 mg by mouth 2 (two) times daily as needed for muscle spasms. [place]  0  . ESTRACE VAGINAL 0.1 MG/GM vaginal cream Place 1 Applicatorful vaginally daily. Small amount each dose per pt    . ezetimibe (ZETIA) 10 MG tablet TAKE 1 TABLET BY MOUTH DAILY. 90 tablet 3  . hyoscyamine (ANASPAZ) 0.125 MG TBDP disintergrating tablet Place 1 tablet (0.125 mg total) under the tongue 2 (two) times daily as needed. (Patient taking differently: Place 0.125 mg under the tongue 2 (two) times daily as needed for bladder spasms or cramping. ) 180 tablet 0  . lidocaine (LIDODERM) 5 % Place 1 patch onto the skin as needed (pain). Remove & Discard patch within 12 hours or as directed by MD    . LORazepam (ATIVAN) 1 MG tablet Take 1 tablet (1 mg total) by mouth 2 (two) times daily. May take extra 3rd dose if needed 75 tablet 0  . magnesium gluconate (MAGONATE) 500 MG tablet Take 500 mg by mouth daily as needed (constipation).     . meclizine (ANTIVERT) 25 MG tablet Take 1 tablet (25 mg total) by mouth 3 (three) times daily as needed for dizziness. 3 month supply (Patient taking differently: Take 25 mg by mouth 3 (three) times daily. 3 month supply) 60 tablet 0  . metoprolol succinate (TOPROL-XL) 50 MG 24 hr tablet Take 1 tablet (50 mg total) by mouth daily with breakfast. 90 tablet 1  . Misc Natural  Products (TART CHERRY ADVANCED PO) Take 5 mLs by mouth daily. Juice Concentrate- 1tsp daily    . morphine (MS CONTIN) 30 MG 12 hr tablet Take 30 mg by mouth every 12 (twelve) hours.   0  . morphine (MSIR) 15 MG tablet Take 15 mg by mouth every 4 (four) hours as needed for severe pain.    . Multiple  Vitamins-Minerals (CENTRUM SILVER) tablet Take 1 tablet by mouth daily.    . Omega-3 Fatty Acids (FISH OIL) 1200 MG CAPS Take 1,200 mg by mouth at bedtime.     . OXYGEN Inhale 2.5 L/hr into the lungs continuous.    . pantoprazole (PROTONIX) 40 MG tablet TAKE 1 TABLET (40 MG TOTAL) BY MOUTH DAILY. 90 tablet 3  . Polyethyl Glycol-Propyl Glycol (SYSTANE) 0.4-0.3 % SOLN Apply 1 drop to eye daily as needed (dry eyes).     . pravastatin (PRAVACHOL) 40 MG tablet Take 1 tablet (40 mg total) by mouth at bedtime. 90 tablet 3  . promethazine (PHENERGAN) 25 MG tablet Take 25 mg by mouth every 8 (eight) hours as needed for nausea or vomiting.   1  . Respiratory Therapy Supplies (NEBULIZER) DEVI Use as directed 1 each 0  . ZOVIRAX 5 % Apply 1 application topically as needed (flair).     . fexofenadine (ALLEGRA) 180 MG tablet Take 1 tablet (180 mg total) by mouth daily as needed for allergies. As needed 90 tablet 3   No current facility-administered medications for this visit.    Facility-Administered Medications Ordered in Other Visits  Medication Dose Route Frequency Provider Last Rate Last Dose  . 0.9 %  sodium chloride infusion  1,000 mL Intravenous Once [place], NP        Allergies as of 08/27/2018 - Review Complete 08/27/2018  Allergen Reaction Noted  . Qvar [beclomethasone]  09/17/2012  . Spiriva [tiotropium bromide monohydrate]  09/17/2012  . Advair diskus [fluticasone-salmeterol]  08/10/2018  . Chlorzoxazone  07/04/2007  . Epinephrine  05/21/2011  . Lamotrigine  10/20/2007  . Levofloxacin  10/20/2007  . Neurontin [gabapentin]  05/21/2011  . Protriptyline hcl  05/21/2011  . Ropinirole hcl  05/21/2011  . Tamiflu [oseltamivir phosphate] Other (See Comments) 04/07/2018  . Tizanidine  05/21/2011  . Verapamil  07/04/2007  . Zonegran  05/21/2011  . Advair diskus [fluticasone-salmeterol] Rash 05/21/2011  . Baclofen Rash 05/21/2011  . Chlorzoxazone Rash 05/21/2011  . Levofloxacin  Rash 05/21/2011  . Protriptyline Rash 05/10/2014  . Ropinirole Rash 05/10/2014    Vitals: BP 112/69 (BP Location: Left Arm, Patient Position: Sitting)   Pulse 75   Ht 5\' 7"  (1.702 m)   Wt 200 lb (90.7 kg)   BMI 31.32 kg/m  Last Weight:  Wt Readings from Last 1 Encounters:  08/27/18 200 lb (90.7 kg)   Last Height:   Ht Readings from Last 1 Encounters:  08/27/18 5\' 7"  (1.702 m)     Physical exam: Exam: Gen: NAD,                     CV: RRR, no MRG. No Carotid Bruits. No peripheral edema, warm, nontender Eyes: Conjunctivae clear without exudates or hemorrhage  Neuro: Detailed Neurologic Exam  Speech:    Speech is dysarthric Cognition:  Tangential, The patient is oriented to person, place, and time;     recent and remote memory impaired;     Without aphasia or apraxia    Impaired attention, concentration,  fund of knowledge Cranial  Nerves:    The pupils are equal, round, and reactive to light. Attempted fundoscopic exam could not visualize due to small pupils.  Visual fields are full to finger confrontation. Extraocular movements are intact. Trigeminal sensation is intact and the muscles of mastication are normal. The face is symmetric. The palate elevates in the midline. Hearing intact. Voice is normal. Shoulder shrug is normal. The tongue has normal motion without fasciculations.   Coordination:    No dysmetria noted  Gait:    Patient is in a wheelchair on continuous oxygen  Motor Observation:    no involuntary movements noted, no tremor noted Tone:    Normal muscle tone.      Strength:    Strength is V/V in the upper and lower limbs.      Sensation: intact to LT     Reflex Exam:  DTR's:    Absent AJs otherwise deep tendon reflexes in the upper and lower extremities are symmetrical bilaterally.   Clonus:    Clonus is absent.     Assessment/Plan:   67 y.o. female here for headaches and migraines. She has a very complicated medical and social history as  well as long history of headaches and migraines and she has been seen by multiple neurologists including ones here in [place] and at [place] and [facility].  She's had extensive neurological evaluation and negative neurovascular workup in the past. She has an otherwise extensive medical history and multiple social issues.  - Today I discussed with patient and husband that I am going to focus on her headaches. Her and her husband are quite tangential, bringing up unrelated issues and telling stories, switching topics without completing thoughts, very difficult to redirect. I cannot address all her multiple neurologic issues today, we will focus on headaches.  - She has extensive list of current medications, polypharmacy. She has tried and failed multiple first-line, second line and third line headache medication classes. . There may be a component of medication overuse headache due to chronic pain medications.   - I feel the best therapy at this point would be the new CGRP medications Aimovig, Emgality or Ajovy. Discussed in detail, discussed mechanism, repeated myself multiple times on the excellent side-effect profile, very few contraindications or medication interactions. I will discuss with Dr. Marin Olp and Dr. Myles Rosenthal at their insistence as they say they were told specifically from both not to take this medication. I explained I may not have anything else for her to try except this. Maybe we could try Namenda, this has some positive case studies for migraines and may help with her memory loss.   - Per Dr. Weber Cooks notes, she has been dismissed by him and other physicians for belligerant behavior which I did mildly experience today.   Sarina Ill, MD  [facility] [address] [place], [place] [address]  Phone [phone] Fax [phone]  A total of 50 minutes was spent face-to-face with this patient. Over half this time was spent on counseling patient on the   1. Chronic migraine without aura, with intractable migraine, so stated, with status migrainosus     diagnosis and different diagnostic and therapeutic options, counseling and coordination of care, risks ans benefits of management, compliance, or risk factor reduction and education.

## 2018-08-28 ENCOUNTER — Telehealth: Payer: Self-pay | Admitting: *Deleted

## 2018-08-28 NOTE — Telephone Encounter (Signed)
LVM on home phone informing the patient that her MRA  neck and brain blood vessels shows no significant narrowing. Nothing worrisome found. Also advised her that the brain MRI scan showed minor age-appropriate changes of hardening of the arteries. No definite stroke and no worrisome finding. Left number for questions and advised our office closes at noon today.

## 2018-08-30 ENCOUNTER — Encounter: Payer: Self-pay | Admitting: Neurology

## 2018-08-30 DIAGNOSIS — G43711 Chronic migraine without aura, intractable, with status migrainosus: Secondary | ICD-10-CM | POA: Insufficient documentation

## 2018-09-02 ENCOUNTER — Telehealth: Payer: Self-pay | Admitting: Neurology

## 2018-09-02 NOTE — Telephone Encounter (Signed)
Natalie Hendrix, Dr. Marin Olp is fine with me starting Aimovig, Ajovy or Emgality on patient as we discussed in appointment with patient and husband. I cannot reach Nicholaus Bloom, I have called his office and get no answer and cannot leave a message. He is not in [employer] for email. I am willing to prescribe this medication for her but she has to discuss with Dr. [name] herself if she wants his approval first. I'm afraid this is the only option I have for her at this point for migraines if she declines I will refer her elsewhere. thanks

## 2018-09-02 NOTE — Telephone Encounter (Signed)
Spoke with pt's husband. Discussed all of Dr. Cathren Laine message in detail. He stated that pt will be talking to Dr. Elnoria Howard and Dr. [name] and they will call back with the results. He was polite but said that if the Aimovig was the only thing that pt could be treated with then they may need to see another doctor. Pt was encouraged to please call back when they have decided. He verbalized appreciation.

## 2018-09-21 ENCOUNTER — Other Ambulatory Visit: Payer: Medicare Other

## 2018-09-21 ENCOUNTER — Ambulatory Visit: Payer: Medicare Other | Admitting: Family

## 2018-09-23 ENCOUNTER — Telehealth: Payer: Self-pay | Admitting: Internal Medicine

## 2018-09-23 DIAGNOSIS — F32A Depression, unspecified: Secondary | ICD-10-CM

## 2018-09-23 DIAGNOSIS — R42 Dizziness and giddiness: Secondary | ICD-10-CM

## 2018-09-23 DIAGNOSIS — D751 Secondary polycythemia: Secondary | ICD-10-CM

## 2018-09-23 DIAGNOSIS — F329 Major depressive disorder, single episode, unspecified: Secondary | ICD-10-CM

## 2018-09-23 MED ORDER — AZITHROMYCIN 250 MG PO TABS: ORAL_TABLET | ORAL | 0 refills | Status: DC

## 2018-09-23 NOTE — Telephone Encounter (Signed)
Called and spoke with patient, she stated that she is having an episode of vertigo. She is nauseated, dizzy, pain in right ear, redness, and inflammation. Patient would like something called in.    CY please advise, thank you.   Current Outpatient Medications on File Prior to Visit  Medication Sig Dispense Refill  . albuterol (PROAIR HFA) 108 (90 Base) MCG/ACT inhaler Inhale 2 puffs into the lungs every 6 (six) hours as needed for wheezing or shortness of breath. 3 Inhaler 3  . albuterol (PROVENTIL) (2.5 MG/3ML) 0.083% nebulizer solution Take 3 mLs (2.5 mg total) by nebulization every 6 (six) hours as needed for wheezing or shortness of breath. 1080 mL 1  . azelastine (ASTELIN) 0.1 % nasal spray Place 1 spray into both nostrils 2 (two) times daily. 90 mL 3  . beclomethasone (BECONASE-AQ) 42 MCG/SPRAY nasal spray Place 2 sprays into both nostrils 2 (two) times daily. Dose is for each nostril. 75 g 2  . benzonatate (TESSALON) 100 MG capsule Take 1 capsule (100 mg total) by mouth every 6 (six) hours as needed for cough. 360 capsule 3  . buPROPion (WELLBUTRIN XL) 300 MG 24 hr tablet Take 1 tablet (300 mg total) by mouth daily with breakfast. 90 tablet 3  . Calcium Carbonate-Vit D-Min (CALCIUM 1200 PO) Take 1,250 mg by mouth daily.     . clopidogrel (PLAVIX) 75 MG tablet Take 1 tablet (75 mg total) by mouth daily. 90 tablet 3  . cyclobenzaprine (FLEXERIL) 5 MG tablet Take 5 mg by mouth 2 (two) times daily as needed for muscle spasms. Phillips  0  . ESTRACE VAGINAL 0.1 MG/GM vaginal cream Place 1 Applicatorful vaginally daily. Small amount each dose per pt    . ezetimibe (ZETIA) 10 MG tablet TAKE 1 TABLET BY MOUTH DAILY. 90 tablet 3  . fexofenadine (ALLEGRA) 180 MG tablet Take 1 tablet (180 mg total) by mouth daily as needed for allergies. As needed 90 tablet 3  . hyoscyamine (ANASPAZ) 0.125 MG TBDP disintergrating tablet Place 1 tablet (0.125 mg total) under the tongue 2 (two) times daily as needed.  (Patient taking differently: Place 0.125 mg under the tongue 2 (two) times daily as needed for bladder spasms or cramping. ) 180 tablet 0  . lidocaine (LIDODERM) 5 % Place 1 patch onto the skin as needed (pain). Remove & Discard patch within 12 hours or as directed by MD    . LORazepam (ATIVAN) 1 MG tablet Take 1 tablet (1 mg total) by mouth 2 (two) times daily. May take extra 3rd dose if needed 75 tablet 0  . magnesium gluconate (MAGONATE) 500 MG tablet Take 500 mg by mouth daily as needed (constipation).     . meclizine (ANTIVERT) 25 MG tablet Take 1 tablet (25 mg total) by mouth 3 (three) times daily as needed for dizziness. 3 month supply (Patient taking differently: Take 25 mg by mouth 3 (three) times daily. 3 month supply) 60 tablet 0  . metoprolol succinate (TOPROL-XL) 50 MG 24 hr tablet Take 1 tablet (50 mg total) by mouth daily with breakfast. 90 tablet 1  . Misc Natural Products (TART CHERRY ADVANCED PO) Take 5 mLs by mouth daily. Juice Concentrate- 1tsp daily    . morphine (MS CONTIN) 30 MG 12 hr tablet Take 30 mg by mouth every 12 (twelve) hours.   0  . morphine (MSIR) 15 MG tablet Take 15 mg by mouth every 4 (four) hours as needed for severe pain.    Marland Kitchen  Multiple Vitamins-Minerals (CENTRUM SILVER) tablet Take 1 tablet by mouth daily.    . Omega-3 Fatty Acids (FISH OIL) 1200 MG CAPS Take 1,200 mg by mouth at bedtime.     . OXYGEN Inhale 2.5 L/hr into the lungs continuous.    . pantoprazole (PROTONIX) 40 MG tablet TAKE 1 TABLET (40 MG TOTAL) BY MOUTH DAILY. 90 tablet 3  . Polyethyl Glycol-Propyl Glycol (SYSTANE) 0.4-0.3 % SOLN Apply 1 drop to eye daily as needed (dry eyes).     . pravastatin (PRAVACHOL) 40 MG tablet Take 1 tablet (40 mg total) by mouth at bedtime. 90 tablet 3  . promethazine (PHENERGAN) 25 MG tablet Take 25 mg by mouth every 8 (eight) hours as needed for nausea or vomiting.   1  . Respiratory Therapy Supplies (NEBULIZER) DEVI Use as directed 1 each 0  . ZOVIRAX 5 % Apply 1  application topically as needed (flair).      Current Facility-Administered Medications on File Prior to Visit  Medication Dose Route Frequency Provider Last Rate Last Dose  . 0.9 %  sodium chloride infusion  1,000 mL Intravenous Once [place], NP       Allergies  Allergen Reactions  . Qvar [Beclomethasone]   . Spiriva [Tiotropium Bromide Monohydrate]     rash  . Advair Diskus [Fluticasone-Salmeterol]   . Chlorzoxazone   . Epinephrine     Raises heart rate  . Lamotrigine   . Levofloxacin   . Neurontin [Gabapentin]     arthralgia  . Protriptyline Hcl     Severe constipation  . Ropinirole Hcl     arthralgia  . Tamiflu [Oseltamivir Phosphate] Other (See Comments)    "like I was having a stroke"  . Tizanidine     panic  . Verapamil   . Zonegran     Mood swings  . Advair Diskus [Fluticasone-Salmeterol] Rash  . Baclofen Rash  . Chlorzoxazone Rash  . Levofloxacin Rash  . Protriptyline Rash  . Ropinirole Rash

## 2018-09-23 NOTE — Telephone Encounter (Signed)
Offer Z apk 250 mg, # 6, 2 today then one daily            Meclizine 25 mg, # 10, 1 every 8 hours as needed for vertigo

## 2018-09-23 NOTE — Telephone Encounter (Signed)
Called pt and advised message from the provider. Pt understood and verbalized understanding. Nothing further is needed.    Rx sent in. 

## 2018-09-29 ENCOUNTER — Encounter

## 2018-09-29 ENCOUNTER — Inpatient Hospital Stay: Payer: Medicare Other | Attending: Hematology & Oncology

## 2018-09-29 ENCOUNTER — Inpatient Hospital Stay: Payer: Medicare Other

## 2018-09-29 ENCOUNTER — Inpatient Hospital Stay (HOSPITAL_BASED_OUTPATIENT_CLINIC_OR_DEPARTMENT_OTHER): Payer: Medicare Other | Admitting: Family

## 2018-09-29 VITALS — BP 120/57 | HR 78

## 2018-09-29 VITALS — BP 154/84 | HR 81 | Temp 98.2°F | Resp 18 | Ht 67.0 in | Wt 203.0 lb

## 2018-09-29 DIAGNOSIS — Z9981 Dependence on supplemental oxygen: Secondary | ICD-10-CM | POA: Diagnosis not present

## 2018-09-29 DIAGNOSIS — R5383 Other fatigue: Secondary | ICD-10-CM | POA: Diagnosis not present

## 2018-09-29 DIAGNOSIS — J449 Chronic obstructive pulmonary disease, unspecified: Secondary | ICD-10-CM | POA: Diagnosis not present

## 2018-09-29 DIAGNOSIS — D500 Iron deficiency anemia secondary to blood loss (chronic): Secondary | ICD-10-CM | POA: Insufficient documentation

## 2018-09-29 DIAGNOSIS — Z79899 Other long term (current) drug therapy: Secondary | ICD-10-CM | POA: Diagnosis not present

## 2018-09-29 DIAGNOSIS — D45 Polycythemia vera: Secondary | ICD-10-CM

## 2018-09-29 DIAGNOSIS — D751 Secondary polycythemia: Secondary | ICD-10-CM | POA: Diagnosis present

## 2018-09-29 LAB — CBC WITH DIFFERENTIAL (CANCER CENTER ONLY)
ABS IMMATURE GRANULOCYTES: 0.02 10*3/uL (ref 0.00–0.07)
BASOS ABS: 0 10*3/uL (ref 0.0–0.1)
BASOS PCT: 1 %
Eosinophils Absolute: 0.1 10*3/uL (ref 0.0–0.5)
Eosinophils Relative: 2 %
HCT: 39.6 % (ref 36.0–46.0)
Hemoglobin: 11.7 g/dL — ABNORMAL LOW (ref 12.0–15.0)
Immature Granulocytes: 0 %
LYMPHS PCT: 18 %
Lymphs Abs: 1.3 10*3/uL (ref 0.7–4.0)
MCH: 22.5 pg — ABNORMAL LOW (ref 26.0–34.0)
MCHC: 29.5 g/dL — AB (ref 30.0–36.0)
MCV: 76.3 fL — ABNORMAL LOW (ref 80.0–100.0)
Monocytes Absolute: 0.5 10*3/uL (ref 0.1–1.0)
Monocytes Relative: 7 %
NEUTROS ABS: 5.2 10*3/uL (ref 1.7–7.7)
NRBC: 0 % (ref 0.0–0.2)
Neutrophils Relative %: 72 %
PLATELETS: 292 10*3/uL (ref 150–400)
RBC: 5.19 MIL/uL — AB (ref 3.87–5.11)
RDW: 15.4 % (ref 11.5–15.5)
WBC: 7.2 10*3/uL (ref 4.0–10.5)

## 2018-09-29 MED ORDER — SODIUM CHLORIDE 0.9 % IV SOLN
Freq: Once | INTRAVENOUS | Status: AC
  Administered 2018-09-29: 16:00:00 via INTRAVENOUS
  Filled 2018-09-29: qty 250

## 2018-09-29 NOTE — Patient Instructions (Signed)
Therapeutic Phlebotomy Therapeutic phlebotomy is the controlled removal of blood from a person's body for the purpose of treating a medical condition. The procedure is similar to donating blood. Usually, about a pint (470 mL, or 0.47L) of blood is removed. The average adult has 9-12 pints (4.3-5.7 L) of blood. Therapeutic phlebotomy may be used to treat the following medical conditions:  Hemochromatosis. This is a condition in which the blood contains too much iron.  Polycythemia vera. This is a condition in which the blood contains too many red blood cells.  Porphyria cutanea tarda. This is a disease in which an important part of hemoglobin is not made properly. It results in the buildup of abnormal amounts of porphyrins in the body.  Sickle cell disease. This is a condition in which the red blood cells form an abnormal crescent shape rather than a round shape.  Tell a health care provider about:  Any allergies you have.  All medicines you are taking, including vitamins, herbs, eye drops, creams, and over-the-counter medicines.  Any problems you or family members have had with anesthetic medicines.  Any blood disorders you have.  Any surgeries you have had.  Any medical conditions you have. What are the risks? Generally, this is a safe procedure. However, problems may occur, including:  Nausea or light-headedness.  Low blood pressure.  Soreness, bleeding, swelling, or bruising at the needle insertion site.  Infection.  What happens before the procedure?  Follow instructions from your health care provider about eating or drinking restrictions.  Ask your health care provider about changing or stopping your regular medicines. This is especially important if you are taking diabetes medicines or blood thinners.  Wear clothing with sleeves that can be raised above the elbow.  Plan to have someone take you home after the procedure.  You may have a blood sample taken. What  happens during the procedure?  A needle will be inserted into one of your veins.  Tubing and a collection bag will be attached to that needle.  Blood will flow through the needle and tubing into the collection bag.  You may be asked to open and close your hand slowly and continually during the entire collection.  After the specified amount of blood has been removed from your body, the collection bag and tubing will be clamped.  The needle will be removed from your vein.  Pressure will be held on the site of the needle insertion to stop the bleeding.  A bandage (dressing) will be placed over the needle insertion site. The procedure may vary among health care providers and hospitals. What happens after the procedure?  Your recovery will be assessed and monitored.  You can return to your normal activities as directed by your health care provider. This information is not intended to replace advice given to you by your health care provider. Make sure you discuss any questions you have with your health care provider. Document Released: 04/22/2011 Document Revised: 07/20/2016 Document Reviewed: 11/14/2014 Elsevier Interactive Patient Education  2018 [employer]  

## 2018-09-29 NOTE — Progress Notes (Signed)
Natalie Hendrix presents today for phlebotomy per MD orders. Phlebotomy procedure started with 18 gauge angiocath at 1540 and ended at 1550. 508 grams removed. .9 NS started at 500 hour in same site  Patient observed for 30 minutes after procedure without any incident. Patient tolerated procedure well. IV needle removed intact.

## 2018-09-29 NOTE — Progress Notes (Signed)
Hematology and Oncology Follow Up Visit  Natalie Hendrix [id number] Sep 24, 1951 67 y.o. 09/29/2018   Principle Diagnosis:  Secondary polycythemia vera- JAK2 negative  Current Therapy:   Phlebotomy to maintain hematocrit less than 38% Plavix 75 mg by mouth daily   Interim History:  Natalie Hendrix is here today with her husband and sweet service dog Cookie. She is feeling fatigued. Her complexion is [place] and she has had some episodes of vertigo.  Hct today is 39.6%.  Her SOB is generally stable on supplemental O2 24 hours a day.  She has started sleeping in a c-collar with a mouth guard and feels that this has helped reduce the amount of migraines she is having.  She has not noted any bleeding, no petechiae. No excessive bruising on Plavix.  No fever, chills, n/v, cough, rash, chest pain, palpitations, abdominal pain or changes in bowel or bladder habits.  No swelling, tenderness, numbness or tingling in her extremities.  No lymphadenopathy noted on exam.  Her appetite waxes and wanes but she makes an effort to stay well hydrated. Her weight is stable.   ECOG Performance Status: 1 - Symptomatic but completely ambulatory  Medications:  Allergies as of 09/29/2018      Reactions   Tizanidine Other (See Comments)   panic   Qvar [beclomethasone]    Spiriva [tiotropium Bromide Monohydrate]    rash   Advair Diskus [fluticasone-salmeterol]    Chlorzoxazone    Epinephrine    Raises heart rate   Lamotrigine    Levofloxacin    Neurontin [gabapentin]    arthralgia   Protriptyline Hcl    Severe constipation   Ropinirole Hcl    arthralgia   Tamiflu [oseltamivir Phosphate] Other (See Comments)   "like I was having a stroke"   Verapamil    Zonegran    Mood swings   Advair Diskus [fluticasone-salmeterol] Rash   Baclofen Rash   Chlorzoxazone Rash   Levofloxacin Rash   Protriptyline Rash   Ropinirole Rash      Medication List        Accurate as of 09/29/18  2:45 PM. Always use  your most recent med list.          albuterol 108 (90 Base) MCG/ACT inhaler Commonly known as:  PROVENTIL HFA;VENTOLIN HFA Inhale 2 puffs into the lungs every 6 (six) hours as needed for wheezing or shortness of breath.   albuterol (2.5 MG/3ML) 0.083% nebulizer solution Commonly known as:  PROVENTIL Take 3 mLs (2.5 mg total) by nebulization every 6 (six) hours as needed for wheezing or shortness of breath.   azelastine 0.1 % nasal spray Commonly known as:  ASTELIN Place 1 spray into both nostrils 2 (two) times daily.   azithromycin 250 MG tablet Commonly known as:  ZITHROMAX Take 2 pills today then one a day for 4 additional days   beclomethasone 42 MCG/SPRAY nasal spray Commonly known as:  BECONASE-AQ Place 2 sprays into both nostrils 2 (two) times daily. Dose is for each nostril.   benzonatate 100 MG capsule Commonly known as:  TESSALON Take 1 capsule (100 mg total) by mouth every 6 (six) hours as needed for cough.   buPROPion 300 MG 24 hr tablet Commonly known as:  WELLBUTRIN XL Take 1 tablet (300 mg total) by mouth daily with breakfast.   CALCIUM 1200 PO Take 1,250 mg by mouth daily.   CENTRUM SILVER tablet Take 1 tablet by mouth daily.   clopidogrel 75 MG tablet Commonly known as:  PLAVIX Take 1 tablet (75 mg total) by mouth daily.   cyclobenzaprine 5 MG tablet Commonly known as:  FLEXERIL Take 5 mg by mouth 2 (two) times daily as needed for muscle spasms. Phillips   ESTRACE VAGINAL 0.1 MG/GM vaginal cream Generic drug:  estradiol Place 1 Applicatorful vaginally daily. Small amount each dose per pt   ezetimibe 10 MG tablet Commonly known as:  ZETIA TAKE 1 TABLET BY MOUTH DAILY.   fexofenadine 180 MG tablet Commonly known as:  ALLEGRA Take 1 tablet (180 mg total) by mouth daily as needed for allergies. As needed   Fish Oil 1200 MG Caps Take 1,200 mg by mouth at bedtime.   hyoscyamine 0.125 MG Tbdp disintergrating tablet Commonly known as:   ANASPAZ Place 1 tablet (0.125 mg total) under the tongue 2 (two) times daily as needed.   lidocaine 5 % Commonly known as:  LIDODERM Place 1 patch onto the skin as needed (pain). Remove & Discard patch within 12 hours or as directed by MD   LORazepam 1 MG tablet Commonly known as:  ATIVAN Take 1 tablet (1 mg total) by mouth 2 (two) times daily. May take extra 3rd dose if needed   magnesium gluconate 500 MG tablet Commonly known as:  MAGONATE Take 500 mg by mouth daily as needed (constipation).   meclizine 25 MG tablet Commonly known as:  ANTIVERT Take 1 tablet (25 mg total) by mouth 3 (three) times daily as needed for dizziness. 3 month supply   metoprolol succinate 50 MG 24 hr tablet Commonly known as:  TOPROL-XL Take 1 tablet (50 mg total) by mouth daily with breakfast.   morphine 15 MG tablet Commonly known as:  MSIR Take 15 mg by mouth every 4 (four) hours as needed for severe pain.   morphine 30 MG 12 hr tablet Commonly known as:  MS CONTIN Take 30 mg by mouth every 12 (twelve) hours.   Nebulizer Devi Use as directed   OXYGEN Inhale 2.5 L/hr into the lungs continuous.   pantoprazole 40 MG tablet Commonly known as:  PROTONIX TAKE 1 TABLET (40 MG TOTAL) BY MOUTH DAILY.   pravastatin 40 MG tablet Commonly known as:  PRAVACHOL Take 1 tablet (40 mg total) by mouth at bedtime.   promethazine 25 MG tablet Commonly known as:  PHENERGAN Take 25 mg by mouth every 8 (eight) hours as needed for nausea or vomiting.   SYSTANE 0.4-0.3 % Soln Generic drug:  Polyethyl Glycol-Propyl Glycol Apply 1 drop to eye daily as needed (dry eyes).   TART CHERRY ADVANCED PO Take 5 mLs by mouth daily. Juice Concentrate- 1tsp daily   ZOVIRAX 5 % Generic drug:  acyclovir ointment Apply 1 application topically as needed (flair).       Allergies:  Allergies  Allergen Reactions  . Tizanidine Other (See Comments)    panic  . Qvar [Beclomethasone]   . Spiriva [Tiotropium Bromide  Monohydrate]     rash  . Advair Diskus [Fluticasone-Salmeterol]   . Chlorzoxazone   . Epinephrine     Raises heart rate  . Lamotrigine   . Levofloxacin   . Neurontin [Gabapentin]     arthralgia  . Protriptyline Hcl     Severe constipation  . Ropinirole Hcl     arthralgia  . Tamiflu [Oseltamivir Phosphate] Other (See Comments)    "like I was having a stroke"  . Verapamil   . Zonegran     Mood swings  . Advair Diskus [Fluticasone-Salmeterol] Rash  .  Baclofen Rash  . Chlorzoxazone Rash  . Levofloxacin Rash  . Protriptyline Rash  . Ropinirole Rash    Past Medical History, Surgical history, Social history, and Family History were reviewed and updated.  Review of Systems: All other 10 point review of systems is negative.   Physical Exam:  vitals were not taken for this visit.   Wt Readings from Last 3 Encounters:  08/27/18 200 lb (90.7 kg)  08/10/18 203 lb (92.1 kg)  08/10/18 203 lb (92.1 kg)    Ocular: Sclerae unicteric, pupils equal, round and reactive to light Ear-nose-throat: Oropharynx clear, dentition fair Lymphatic: No cervical, supraclavicular or axillary adenopathy Lungs no rales or rhonchi, good excursion bilaterally Heart regular rate and rhythm, no murmur appreciated Abd soft, nontender, positive bowel sounds, no liver or spleen tip palpated on exam, no fluid wave  MSK no focal spinal tenderness, no joint edema Neuro: non-focal, well-oriented, appropriate affect Breasts: Deferred   Lab Results  Component Value Date   WBC 7.2 09/29/2018   HGB 11.7 (L) 09/29/2018   HCT 39.6 09/29/2018   MCV 76.3 (L) 09/29/2018   PLT 292 09/29/2018   Lab Results  Component Value Date   FERRITIN <4 (L) 08/17/2018   IRON 38 (L) 08/17/2018   TIBC 450 (H) 08/17/2018   UIBC 412 08/17/2018   IRONPCTSAT 8 (L) 08/17/2018   Lab Results  Component Value Date   RETICCTPCT 1.1 04/27/2015   RBC 5.19 (H) 09/29/2018   RETICCTABS 55.1 04/27/2015   No results found for:  KPAFRELGTCHN, LAMBDASER, KAPLAMBRATIO No results found for: IGGSERUM, IGA, IGMSERUM No results found for: Odetta Pink, SPEI   Chemistry      Component Value Date/Time   NA 141 08/17/2018 1106   NA 147 (H) 10/08/2017 1410   NA 141 02/17/2017 1132   K 4.1 08/17/2018 1106   K 4.6 10/08/2017 1410   K 5.3 (H) 02/17/2017 1132   CL 109 (H) 08/17/2018 1106   CL 106 10/08/2017 1410   CO2 31 08/17/2018 1106   CO2 30 10/08/2017 1410   CO2 29 02/17/2017 1132   BUN 11 08/17/2018 1106   BUN 15 10/08/2017 1410   BUN 17.8 02/17/2017 1132   CREATININE 1.10 08/17/2018 1106   CREATININE 1.0 10/08/2017 1410   CREATININE 0.8 02/17/2017 1132      Component Value Date/Time   CALCIUM 9.3 08/17/2018 1106   CALCIUM 9.0 10/08/2017 1410   CALCIUM 9.3 02/17/2017 1132   ALKPHOS 70 08/17/2018 1106   ALKPHOS 59 10/08/2017 1410   ALKPHOS 79 02/17/2017 1132   AST 30 08/17/2018 1106   AST 22 02/17/2017 1132   ALT 25 08/17/2018 1106   ALT 26 10/08/2017 1410   ALT 20 02/17/2017 1132   BILITOT 0.5 08/17/2018 1106   BILITOT 0.39 02/17/2017 1132       Impression and Plan: Natalie Hendrix is a very pleasant 67 yo caucasian female with secondary polycythemia (JAK2 negative) due to COPD. Hct is 39.6% at this time and she is symptomatic as mentioned above.  We will proceed with phlebotomy and replacement fluids. We will see her back in another month.  They will contact our office with any questions or concerns. We can certainly see her sooner if need be.   [name], NP 10/29/20192:45 PM

## 2018-09-30 LAB — CMP (CANCER CENTER ONLY)
ALK PHOS: 72 U/L (ref 38–126)
ALT: 20 U/L (ref 0–44)
AST: 25 U/L (ref 15–41)
Albumin: 3.9 g/dL (ref 3.5–5.0)
Anion gap: 9 (ref 5–15)
BUN: 13 mg/dL (ref 8–23)
CALCIUM: 9.5 mg/dL (ref 8.9–10.3)
CO2: 27 mmol/L (ref 22–32)
Chloride: 105 mmol/L (ref 98–111)
Creatinine: 0.86 mg/dL (ref 0.44–1.00)
GFR, Estimated: >60 mL/min (ref >60)
Glucose, Bld: 97 mg/dL (ref 70–99)
Potassium: 4.7 mmol/L (ref 3.5–5.1)
SODIUM: 141 mmol/L (ref 135–145)
Total Bilirubin: 0.4 mg/dL (ref 0.3–1.2)
Total Protein: 7 g/dL (ref 6.5–8.1)

## 2018-10-01 LAB — IRON AND TIBC
IRON: 40 ug/dL — AB (ref 41–142)
Saturation Ratios: 8 % — ABNORMAL LOW (ref 21–57)
TIBC: 500 ug/dL — ABNORMAL HIGH (ref 236–444)
UIBC: 460 ug/dL — ABNORMAL HIGH (ref 120–384)

## 2018-10-01 LAB — FERRITIN

## 2018-10-02 ENCOUNTER — Ambulatory Visit (INDEPENDENT_AMBULATORY_CARE_PROVIDER_SITE_OTHER): Payer: Medicare Other | Admitting: Nurse Practitioner

## 2018-10-02 ENCOUNTER — Telehealth: Payer: Self-pay | Admitting: Internal Medicine

## 2018-10-02 ENCOUNTER — Encounter: Payer: Self-pay | Admitting: Nurse Practitioner

## 2018-10-02 VITALS — BP 124/82 | HR 85 | Ht 67.0 in | Wt 210.6 lb

## 2018-10-02 DIAGNOSIS — H6691 Otitis media, unspecified, right ear: Secondary | ICD-10-CM

## 2018-10-02 MED ORDER — AMOXICILLIN-POT CLAVULANATE 875-125 MG PO TABS: 1.0000 | ORAL_TABLET | Freq: Two times a day (BID) | ORAL | 0 refills | Status: DC

## 2018-10-02 NOTE — Progress Notes (Signed)
@Patient  ID: Natalie Hendrix, female    DOB: 1951/10/19, 67 y.o.   MRN: [id number]  Chief Complaint  Patient presents with  . Dizziness    Referring provider: Remi Haggard, FNP  HPI 67 year old female never smoker followed for asthma, history pneumonia, cough, complicated by past history for thoracic outlet syndrome, chronic hypoxic respiratory failure, complicated by CVA, GERD, allergic rhinitis, polycythemia, depression. Patient is followed by Dr. Annamaria Boots.   OV 10/02/18 - acute vertigo Patient presents with right ear pain and dizziness. She states that symptoms started about a week ago. She was ordered a z-pack by Dr. Annamaria Boots in telephone call and has completed the course, but states that symptoms still persist. She states that she is also having sinus congestion and sinus headache. She denies any recent fever, chest pain, or shortness of breath.        Allergies  Allergen Reactions  . Tizanidine Other (See Comments)    panic  . Qvar [Beclomethasone]   . Spiriva [Tiotropium Bromide Monohydrate]     rash  . Advair Diskus [Fluticasone-Salmeterol]   . Chlorzoxazone   . Epinephrine     Raises heart rate  . Lamotrigine   . Levofloxacin   . Neurontin [Gabapentin]     arthralgia  . Protriptyline Hcl     Severe constipation  . Ropinirole Hcl     arthralgia  . Tamiflu [Oseltamivir Phosphate] Other (See Comments)    "like I was having a stroke"  . Verapamil   . Zonegran     Mood swings  . Advair Diskus [Fluticasone-Salmeterol] Rash  . Baclofen Rash  . Chlorzoxazone Rash  . Levofloxacin Rash  . Protriptyline Rash  . Ropinirole Rash    Immunization History  Administered Date(s) Administered  . Influenza Split 10/16/2011, 10/12/2012  . Influenza Whole 08/30/2008, 11/01/2009, 09/01/2010  . Influenza,inj,Quad PF,6+ Mos 10/19/2013, 10/17/2016  . Influenza-Unspecified 10/06/2017  . Pneumococcal Conjugate-13 12/02/2005, 11/12/2013  . Pneumococcal Polysaccharide-23  09/22/2006  . Td 08/19/2005, 12/02/2006    Past Medical History:  Diagnosis Date  . Allergic rhinitis   . Allergy   . Anxiety   . Asthma    uses oxygen 2.5 l/m 24/7, sleeps elevated  . Complication of anesthesia    very sensitive to sedatives, B/P drops  . CVA (cerebral vascular accident) ([place]) 1999, 2014   residual left side weakness, dysphagia, word finding difficulty, and short term memory; uses mobile chair, cane  . Dependence on supplemental oxygen   . Depression   . Dizziness   . Fibromyalgia   . Fluttering heart   . GERD (gastroesophageal reflux disease)   . Hyperlipidemia   . Hypertension   . Migraine   . Migraine headache   . Migraine, unspecified, not intractable, without status migrainosus   . Other amnesia   . Polycythemia rubra vera ([place])   . Radiculopathy, site unspecified   . Thoracic outlet syndrome 1981   repair  . TIA (transient ischemic attack)   . Unspecified chronic bronchitis ([place])   . Unspecified dementia without behavioral disturbance ([place])   . Vertigo     Tobacco History: Social History   Tobacco Use  Smoking Status Never Smoker  Smokeless Tobacco Never Used  Tobacco Comment   never used tobacco; secondary smoke exposure   Counseling given: Not Answered Comment: never used tobacco; secondary smoke exposure   Outpatient Encounter Medications as of 10/02/2018  Medication Sig  . albuterol (PROAIR HFA) 108 (90 Base) MCG/ACT inhaler Inhale  2 puffs into the lungs every 6 (six) hours as needed for wheezing or shortness of breath.  Marland Kitchen azelastine (ASTELIN) 0.1 % nasal spray Place 1 spray into both nostrils 2 (two) times daily.  Marland Kitchen azithromycin (ZITHROMAX) 250 MG tablet Take 2 pills today then one a day for 4 additional days  . beclomethasone (BECONASE-AQ) 42 MCG/SPRAY nasal spray Place 2 sprays into both nostrils 2 (two) times daily. Dose is for each nostril.  . benzonatate (TESSALON) 100 MG capsule Take 1 capsule (100 mg total) by mouth every 6  (six) hours as needed for cough.  Marland Kitchen buPROPion (WELLBUTRIN XL) 300 MG 24 hr tablet Take 1 tablet (300 mg total) by mouth daily with breakfast.  . Calcium Carbonate-Vit D-Min (CALCIUM 1200 PO) Take 1,250 mg by mouth daily.   . clopidogrel (PLAVIX) 75 MG tablet Take 1 tablet (75 mg total) by mouth daily.  . cyclobenzaprine (FLEXERIL) 5 MG tablet Take 5 mg by mouth 2 (two) times daily as needed for muscle spasms. Natalie Hendrix  . ESTRACE VAGINAL 0.1 MG/GM vaginal cream Place 1 Applicatorful vaginally daily. Small amount each dose per pt  . ezetimibe (ZETIA) 10 MG tablet TAKE 1 TABLET BY MOUTH DAILY.  . hyoscyamine (ANASPAZ) 0.125 MG TBDP disintergrating tablet Place 1 tablet (0.125 mg total) under the tongue 2 (two) times daily as needed. (Patient taking differently: Place 0.125 mg under the tongue 2 (two) times daily as needed for bladder spasms or cramping. )  . lidocaine (LIDODERM) 5 % Place 1 patch onto the skin as needed (pain). Remove & Discard patch within 12 hours or as directed by MD  . LORazepam (ATIVAN) 1 MG tablet Take 1 tablet (1 mg total) by mouth 2 (two) times daily. May take extra 3rd dose if needed  . magnesium gluconate (MAGONATE) 500 MG tablet Take 500 mg by mouth daily as needed (constipation).   . meclizine (ANTIVERT) 25 MG tablet Take 1 tablet (25 mg total) by mouth 3 (three) times daily as needed for dizziness. 3 month supply (Patient taking differently: Take 25 mg by mouth 3 (three) times daily. 3 month supply)  . metoprolol succinate (TOPROL-XL) 50 MG 24 hr tablet Take 1 tablet (50 mg total) by mouth daily with breakfast.  . Misc Natural Products (TART CHERRY ADVANCED PO) Take 5 mLs by mouth daily. Juice Concentrate- 1tsp daily  . morphine (MS CONTIN) 30 MG 12 hr tablet Take 30 mg by mouth every 12 (twelve) hours.   Marland Kitchen morphine (MSIR) 15 MG tablet Take 15 mg by mouth every 4 (four) hours as needed for severe pain.  . Multiple Vitamins-Minerals (CENTRUM SILVER) tablet Take 1 tablet by  mouth daily.  . Omega-3 Fatty Acids (FISH OIL) 1200 MG CAPS Take 1,200 mg by mouth at bedtime.   . OXYGEN Inhale 2.5 L/hr into the lungs continuous.  . pantoprazole (PROTONIX) 40 MG tablet TAKE 1 TABLET (40 MG TOTAL) BY MOUTH DAILY.  Natalie Hendrix Glycol-Propyl Glycol (SYSTANE) 0.4-0.3 % SOLN Apply 1 drop to eye daily as needed (dry eyes).   . pravastatin (PRAVACHOL) 40 MG tablet Take 1 tablet (40 mg total) by mouth at bedtime.  . promethazine (PHENERGAN) 25 MG tablet Take 25 mg by mouth every 8 (eight) hours as needed for nausea or vomiting.   Marland Kitchen Respiratory Therapy Supplies (NEBULIZER) DEVI Use as directed  . ZOVIRAX 5 % Apply 1 application topically as needed (flair).   Marland Kitchen albuterol (PROVENTIL) (2.5 MG/3ML) 0.083% nebulizer solution Take 3 mLs (2.5 mg  total) by nebulization every 6 (six) hours as needed for wheezing or shortness of breath.  Marland Kitchen amoxicillin-clavulanate (AUGMENTIN) 875-125 MG tablet Take 1 tablet by mouth 2 (two) times daily.  . fexofenadine (ALLEGRA) 180 MG tablet Take 1 tablet (180 mg total) by mouth daily as needed for allergies. As needed   Facility-Administered Encounter Medications as of 10/02/2018  Medication  . 0.9 %  sodium chloride infusion     Review of Systems  Review of Systems  Constitutional: Negative.  Negative for chills and fever.  HENT: Positive for congestion, ear pain (right), postnasal drip, sinus pressure and sinus pain.   Respiratory: Negative for cough, shortness of breath and wheezing.   Cardiovascular: Negative.  Negative for chest pain, palpitations and leg swelling.  Gastrointestinal: Negative.   Allergic/Immunologic: Negative.   Neurological: Negative.   Psychiatric/Behavioral: Negative.        Physical Exam  BP 124/82 (BP Location: Left Arm, Patient Position: Sitting, Cuff Size: Normal)   Pulse 85   Ht 5\' 7"  (1.702 m)   Wt 210 lb 9.6 oz (95.5 kg)   SpO2 98% Comment: on 2L of O2  BMI 32.98 kg/m   Wt Readings from Last 5 Encounters:    10/02/18 210 lb 9.6 oz (95.5 kg)  09/29/18 203 lb (92.1 kg)  08/27/18 200 lb (90.7 kg)  08/10/18 203 lb (92.1 kg)  08/10/18 203 lb (92.1 kg)     Physical Exam  Constitutional: She is oriented to person, place, and time. She appears well-developed and well-nourished. No distress.  HENT:  Right Ear: Tympanic membrane is erythematous and bulging. A middle ear effusion is present.  Left Ear: Hearing, tympanic membrane, external ear and ear canal normal.  Nose: Right sinus exhibits maxillary sinus tenderness and frontal sinus tenderness. Left sinus exhibits maxillary sinus tenderness and frontal sinus tenderness.  Cardiovascular: Normal rate and regular rhythm.  Pulmonary/Chest: Effort normal and breath sounds normal.  Neurological: She is alert and oriented to person, place, and time.  Psychiatric: She has a normal mood and affect.  Nursing note and vitals reviewed.     Assessment & Plan:   Right otitis media Patient Instructions  Will order Augmentin Continue current medications  General instructions: Hydrate  Drink enough water to keep your pee (urine) clear or pale yellow. Rest  Rest as much as possible.  Sleep with your head raised (elevated).  Make sure to get enough sleep each night. General instructions  Put a warm, moist washcloth on your face 3-4 times a day or as told by your doctor. This will help with discomfort.  Wash your hands often with soap and water. If there is no soap and water, use hand sanitizer.  Do not smoke. Avoid being around people who are smoking (secondhand smoke). Call the office if:  You have a fever.  Your symptoms get worse.  Your symptoms do not get better within 10 days.  Follow up with Dr. Annamaria Boots as scheduled       Fenton Foy, NP 10/02/2018

## 2018-10-02 NOTE — Assessment & Plan Note (Signed)
Patient Instructions  Will order Augmentin Continue current medications  General instructions: Hydrate  Drink enough water to keep your pee (urine) clear or pale yellow. Rest  Rest as much as possible.  Sleep with your head raised (elevated).  Make sure to get enough sleep each night. General instructions  Put a warm, moist washcloth on your face 3-4 times a day or as told by your doctor. This will help with discomfort.  Wash your hands often with soap and water. If there is no soap and water, use hand sanitizer.  Do not smoke. Avoid being around people who are smoking (secondhand smoke). Call the office if:  You have a fever.  Your symptoms get worse.  Your symptoms do not get better within 10 days.  Follow up with Dr. Annamaria Boots as scheduled

## 2018-10-02 NOTE — Telephone Encounter (Signed)
Vertigo problems x1.5 weeks now and also feels like she has fluid in her ears and due to this, she has been losing her balance.  Pt was given a zpak and even after finishing the Zpak, symptoms are no better. Darrell states pt does have redness in her ear but is unsure if there is any fluid or anything else in her ear.  Pt has had nausea involved with her symptoms. Asked Darrell if pt had called pt's PCP and he stated he had called PCP and they told him to call pulmonology.  I stated to Darrell since this is no better even after zpak that we should get pt in for an appt. Darrell expressed understanding. Scheduled OV for pt with Lazaro Arms, NP

## 2018-10-02 NOTE — Patient Instructions (Signed)
Will order Augmentin Continue current medications  General instructions: Hydrate  Drink enough water to keep your pee (urine) clear or pale yellow. Rest  Rest as much as possible.  Sleep with your head raised (elevated).  Make sure to get enough sleep each night. General instructions  Put a warm, moist washcloth on your face 3-4 times a day or as told by your doctor. This will help with discomfort.  Wash your hands often with soap and water. If there is no soap and water, use hand sanitizer.  Do not smoke. Avoid being around people who are smoking (secondhand smoke). Call the office if:  You have a fever.  Your symptoms get worse.  Your symptoms do not get better within 10 days.  Follow up with Dr. Annamaria Boots as scheduled

## 2018-10-12 ENCOUNTER — Telehealth: Payer: Self-pay | Admitting: Nurse Practitioner

## 2018-10-12 DIAGNOSIS — H6691 Otitis media, unspecified, right ear: Secondary | ICD-10-CM

## 2018-10-12 NOTE — Telephone Encounter (Signed)
Left message for patient or husband to call back. Called the listed number for the husband and was advised that it was wrong number. I left the message on the home number.

## 2018-10-12 NOTE — Telephone Encounter (Signed)
States has appt today with PCP this (was already scheduled).  He is requesting we send something in for the patient vs PCP.

## 2018-10-12 NOTE — Telephone Encounter (Signed)
Looking back over the chart it looks like she was called in z pack and then I ordered Augmentin at last visit. Please place referral to ENT. Thanks.

## 2018-10-12 NOTE — Telephone Encounter (Signed)
Patient returned call. She stated that she was seen by TN on 10/02/18 for right ear infection. She was on amoxicillin and then was placed on a zpak by TN. She stated that she is not feeling any better and her face feels swollen on the right side. She believes her ear is draining fluid back towards her ear drum. Her throat is also starting to feel itchy.   Pharmacy is [facility] in [place].   Tonya, please advise. Thanks!

## 2018-10-12 NOTE — Telephone Encounter (Signed)
Left message for patient to call back  

## 2018-10-13 NOTE — Telephone Encounter (Signed)
Attempted to call pt but no answer. Left message for pt to return call. 

## 2018-10-14 NOTE — Telephone Encounter (Signed)
ATC pt, no answer. Left message for pt to call back.  

## 2018-10-14 NOTE — Telephone Encounter (Signed)
Patient husband is returning the call. CB is [phone]

## 2018-10-14 NOTE — Telephone Encounter (Signed)
Called patient unable to reach left message to give us a call back.

## 2018-10-15 ENCOUNTER — Telehealth: Payer: Self-pay | Admitting: Hematology & Oncology

## 2018-10-15 MED ORDER — DOXYCYCLINE HYCLATE 100 MG PO TABS: 100.0000 mg | ORAL_TABLET | Freq: Two times a day (BID) | ORAL | 0 refills | Status: DC

## 2018-10-15 NOTE — Telephone Encounter (Signed)
Suggest she try doxycycline 100 mg, # 20, 1 twice daily  Warm compresses may help  If doesn't get better, she may need ENT referral.

## 2018-10-15 NOTE — Telephone Encounter (Signed)
Called pt's husband and advised message from the provider. He understood and verbalized understanding. Nothing further is needed.   Rx sent into [facility] in [place]

## 2018-10-15 NOTE — Telephone Encounter (Signed)
Received call from pt to confirm her next appts. Informed her of 10/28/18 at 1 pm

## 2018-10-15 NOTE — Telephone Encounter (Signed)
Spoke with pt's husband, he is upset because he would like CY to review this message since he knows her and the fact that she only saw [place] once. He would like advise only from CY. Please  review and advise. Thanks.  Current Outpatient Medications on File Prior to Visit  Medication Sig Dispense Refill  . albuterol (PROAIR HFA) 108 (90 Base) MCG/ACT inhaler Inhale 2 puffs into the lungs every 6 (six) hours as needed for wheezing or shortness of breath. 3 Inhaler 3  . albuterol (PROVENTIL) (2.5 MG/3ML) 0.083% nebulizer solution Take 3 mLs (2.5 mg total) by nebulization every 6 (six) hours as needed for wheezing or shortness of breath. 1080 mL 1  . amoxicillin-clavulanate (AUGMENTIN) 875-125 MG tablet Take 1 tablet by mouth 2 (two) times daily. 14 tablet 0  . azelastine (ASTELIN) 0.1 % nasal spray Place 1 spray into both nostrils 2 (two) times daily. 90 mL 3  . azithromycin (ZITHROMAX) 250 MG tablet Take 2 pills today then one a day for 4 additional days 6 tablet 0  . beclomethasone (BECONASE-AQ) 42 MCG/SPRAY nasal spray Place 2 sprays into both nostrils 2 (two) times daily. Dose is for each nostril. 75 g 2  . benzonatate (TESSALON) 100 MG capsule Take 1 capsule (100 mg total) by mouth every 6 (six) hours as needed for cough. 360 capsule 3  . buPROPion (WELLBUTRIN XL) 300 MG 24 hr tablet Take 1 tablet (300 mg total) by mouth daily with breakfast. 90 tablet 3  . Calcium Carbonate-Vit D-Min (CALCIUM 1200 PO) Take 1,250 mg by mouth daily.     . clopidogrel (PLAVIX) 75 MG tablet Take 1 tablet (75 mg total) by mouth daily. 90 tablet 3  . cyclobenzaprine (FLEXERIL) 5 MG tablet Take 5 mg by mouth 2 (two) times daily as needed for muscle spasms. [place]  0  . ESTRACE VAGINAL 0.1 MG/GM vaginal cream Place 1 Applicatorful vaginally daily. Small amount each dose per pt    . ezetimibe (ZETIA) 10 MG tablet TAKE 1 TABLET BY MOUTH DAILY. 90 tablet 3  . fexofenadine (ALLEGRA) 180 MG tablet Take 1 tablet (180 mg  total) by mouth daily as needed for allergies. As needed 90 tablet 3  . hyoscyamine (ANASPAZ) 0.125 MG TBDP disintergrating tablet Place 1 tablet (0.125 mg total) under the tongue 2 (two) times daily as needed. (Patient taking differently: Place 0.125 mg under the tongue 2 (two) times daily as needed for bladder spasms or cramping. ) 180 tablet 0  . lidocaine (LIDODERM) 5 % Place 1 patch onto the skin as needed (pain). Remove & Discard patch within 12 hours or as directed by MD    . LORazepam (ATIVAN) 1 MG tablet Take 1 tablet (1 mg total) by mouth 2 (two) times daily. May take extra 3rd dose if needed 75 tablet 0  . magnesium gluconate (MAGONATE) 500 MG tablet Take 500 mg by mouth daily as needed (constipation).     . meclizine (ANTIVERT) 25 MG tablet Take 1 tablet (25 mg total) by mouth 3 (three) times daily as needed for dizziness. 3 month supply (Patient taking differently: Take 25 mg by mouth 3 (three) times daily. 3 month supply) 60 tablet 0  . metoprolol succinate (TOPROL-XL) 50 MG 24 hr tablet Take 1 tablet (50 mg total) by mouth daily with breakfast. 90 tablet 1  . Misc Natural Products (TART CHERRY ADVANCED PO) Take 5 mLs by mouth daily. Juice Concentrate- 1tsp daily    . morphine (MS CONTIN)  30 MG 12 hr tablet Take 30 mg by mouth every 12 (twelve) hours.   0  . morphine (MSIR) 15 MG tablet Take 15 mg by mouth every 4 (four) hours as needed for severe pain.    . Multiple Vitamins-Minerals (CENTRUM SILVER) tablet Take 1 tablet by mouth daily.    . Omega-3 Fatty Acids (FISH OIL) 1200 MG CAPS Take 1,200 mg by mouth at bedtime.     . OXYGEN Inhale 2.5 L/hr into the lungs continuous.    . pantoprazole (PROTONIX) 40 MG tablet TAKE 1 TABLET (40 MG TOTAL) BY MOUTH DAILY. 90 tablet 3  . Polyethyl Glycol-Propyl Glycol (SYSTANE) 0.4-0.3 % SOLN Apply 1 drop to eye daily as needed (dry eyes).     . pravastatin (PRAVACHOL) 40 MG tablet Take 1 tablet (40 mg total) by mouth at bedtime. 90 tablet 3  .  promethazine (PHENERGAN) 25 MG tablet Take 25 mg by mouth every 8 (eight) hours as needed for nausea or vomiting.   1  . Respiratory Therapy Supplies (NEBULIZER) DEVI Use as directed 1 each 0  . ZOVIRAX 5 % Apply 1 application topically as needed (flair).      Current Facility-Administered Medications on File Prior to Visit  Medication Dose Route Frequency Provider Last Rate Last Dose  . 0.9 %  sodium chloride infusion  1,000 mL Intravenous Once [place], NP       Allergies  Allergen Reactions  . Tizanidine Other (See Comments)    panic  . Qvar [Beclomethasone]   . Spiriva [Tiotropium Bromide Monohydrate]     rash  . Advair Diskus [Fluticasone-Salmeterol]   . Chlorzoxazone   . Epinephrine     Raises heart rate  . Lamotrigine   . Levofloxacin   . Neurontin [Gabapentin]     arthralgia  . Protriptyline Hcl     Severe constipation  . Ropinirole Hcl     arthralgia  . Tamiflu [Oseltamivir Phosphate] Other (See Comments)    "like I was having a stroke"  . Verapamil   . Zonegran     Mood swings  . Advair Diskus [Fluticasone-Salmeterol] Rash  . Baclofen Rash  . Chlorzoxazone Rash  . Levofloxacin Rash  . Protriptyline Rash  . Ropinirole Rash

## 2018-10-15 NOTE — Telephone Encounter (Signed)
Pt is returning call. Cb is [phone].

## 2018-10-26 ENCOUNTER — Encounter: Payer: Self-pay | Admitting: Internal Medicine

## 2018-10-26 ENCOUNTER — Ambulatory Visit (INDEPENDENT_AMBULATORY_CARE_PROVIDER_SITE_OTHER): Payer: Medicare Other | Admitting: Internal Medicine

## 2018-10-26 VITALS — BP 118/82 | HR 73 | Ht 67.0 in | Wt 207.0 lb

## 2018-10-26 DIAGNOSIS — Z23 Encounter for immunization: Secondary | ICD-10-CM | POA: Diagnosis not present

## 2018-10-26 DIAGNOSIS — J4520 Mild intermittent asthma, uncomplicated: Secondary | ICD-10-CM

## 2018-10-26 DIAGNOSIS — J9611 Chronic respiratory failure with hypoxia: Secondary | ICD-10-CM | POA: Diagnosis not present

## 2018-10-26 DIAGNOSIS — J3089 Other allergic rhinitis: Secondary | ICD-10-CM

## 2018-10-26 DIAGNOSIS — J302 Other seasonal allergic rhinitis: Secondary | ICD-10-CM

## 2018-10-26 MED ORDER — BENZONATATE 100 MG PO CAPS: 100.0000 mg | ORAL_CAPSULE | Freq: Four times a day (QID) | ORAL | 3 refills | Status: DC | PRN

## 2018-10-26 MED ORDER — AZELASTINE HCL 0.1 % NA SOLN: 1.0000 | Freq: Two times a day (BID) | NASAL | 3 refills | Status: DC

## 2018-10-26 MED ORDER — BECLOMETHASONE DIPROP MONOHYD 42 MCG/SPRAY NA SUSP: 2.0000 | Freq: Two times a day (BID) | NASAL | 2 refills | Status: DC

## 2018-10-26 NOTE — Progress Notes (Signed)
HPI female never smoker followed for asthma, history pneumonia, cough, complicated by past history for thoracic outlet syndrome, chronic hypoxic respiratory failure, complicated by CVA, GERD, allergic rhinitis, polycythemia, depression O2 2 L sleep and prn/Advanced  Nl PFT 2012 except DLCO 62% Allergy profile was NEG, 4.9 total IgE GI/ Dr Paulita Fujita: Ba swallow> stricture and probably mild aspiration Residual right diaphragm elevation after surgery for right thoracic outlet syndrome --------------------------------------------------------------------------------  04/23/2018-  67 year old female never smoker followed for asthma, history pneumonia, cough, chronic hypoxic respiratory failure,  past history for thoracic outlet syndrome, chronic elevation right diaphragm,complicated by CVA, GERD, allergic rhinitis, polycythemia, depression O2 2-3 L sleep and prn/Advanced   POC = [facility]. ----Chronic Resp failure with hypoxia-pt states she does not go out much when its allergy season and this has helped her breathing. Continues to use O2.  O2 requalified today Pro-air, nebulizer albuterol, Beconase nasal spray Doing very well, staying in to avoid environmental exposures.  Her home has an elaborate air filter system installed by her husband. Using neb twice daily.  Thinks Tamiflu last year caused mental status changes.  10/26/2018- 67 year old female never smoker followed for asthma, history pneumonia, cough, chronic hypoxic respiratory failure,  past history for thoracic outlet syndrome, chronic elevation right diaphragm,complicated by CVA, GERD, allergic rhinitis, polycythemia, depression O2 2-3 L sleep and prn/Advanced   POC = [facility]. NP saw 11/1 and treated for right otitis with Augmentin after Z-Pak. -----6 month follow up for resp failure. And needs RX refills.  Azelastine nasal spray, benzonatate, albuterol HFA, Beconase AQ nasal spray, neb albuterol, Polycythemia being treated with phlebotomy. Had  URI/allergic rhinitis in the spring, followed by vertigo.  Those have resolved.  She takes Benadryl occasionally for itchy eyes and skin.  We discussed possibility of overdrying from antihistamines.  Review of Systems- See HPI   + = positive Constitutional:   No-   weight loss, night sweats, fevers, chills, fatigue, lassitude. HEENT:   frequent  headaches,  Some difficulty swallowing,, sore throat,       No-  Sneezing,+ itching, ear ache, nasal congestion, post nasal drip,  CV:  No-   chest pain, orthopnea, PND, swelling in lower extremities, anasarca, dizziness, palpitations Resp: + shortness of breath with exertion or at rest. productive cough              No-  coughing up of blood.              No-  change in color of mucus.   wheezing.   Skin: No-   rash or lesions. GI:  No-   heartburn, indigestion, abdominal pain, nausea, vomiting, GU:  MS:  No-   joint pain or swelling.   Neuro- + tremor and poor balance Psych:  No- change in mood or affect. No depression or anxiety.  No memory loss.    Objective:   Physical Exam General- Alert, Oriented, Affect+ tearful, Distress- none acute,  Overweight.  + Rolling walker              O2 POC 2L. Skin- rash-none, lesions- none, excoriation- none Lymphadenopathy- none Head- atraumatic            Eyes- Gross vision intact, PERRLA, conjunctivae clear secretions            Ears- Hearing, canals            Nose- Clear, No- Septal dev, mucus, polyps, erosion, perforation  Throat- Mallampati III , mucosa clear , drainage- none, tonsils- atrophic, Neck- flexible , trachea midline, no stridor , thyroid nl, carotid no bruit Chest - symmetrical excursion , unlabored           Heart/CV- RRR , no murmur , no gallop  , no rub, nl s1 s2                           - JVD- none , edema- none, stasis changes- none, varices- none           Lung- clear to P&A, wheeze-none, cough-none,                                                    dullness-none,  rub- none, unlabored           Chest wall- +Vascular surgery scar Right Upper Anterior chest Abd- Br/ Gen/ Rectal- Not done, not indicated Extrem- cyanosis- none, clubbing, none, atrophy- none, strength- . +Rolling walker Neuro- + some word searching

## 2018-10-26 NOTE — Patient Instructions (Addendum)
Order- flu vax senior  We suggested trying Claritin/ loratadine. Maybe look for pediatric dose or split tablet for lower dose.  Meds refilled  Please call if we can help

## 2018-10-27 ENCOUNTER — Ambulatory Visit: Payer: Medicare Other | Admitting: Family

## 2018-10-27 ENCOUNTER — Other Ambulatory Visit: Payer: Medicare Other

## 2018-10-28 ENCOUNTER — Inpatient Hospital Stay: Payer: Medicare Other | Attending: Hematology & Oncology

## 2018-10-28 ENCOUNTER — Inpatient Hospital Stay: Payer: Medicare Other

## 2018-10-28 ENCOUNTER — Other Ambulatory Visit: Payer: Self-pay

## 2018-10-28 ENCOUNTER — Inpatient Hospital Stay (HOSPITAL_BASED_OUTPATIENT_CLINIC_OR_DEPARTMENT_OTHER): Payer: Medicare Other | Admitting: Family

## 2018-10-28 ENCOUNTER — Encounter: Payer: Self-pay | Admitting: Family

## 2018-10-28 VITALS — BP 126/63 | HR 83 | Temp 98.4°F | Resp 17 | Ht 67.0 in | Wt 207.2 lb

## 2018-10-28 DIAGNOSIS — D500 Iron deficiency anemia secondary to blood loss (chronic): Secondary | ICD-10-CM

## 2018-10-28 DIAGNOSIS — D751 Secondary polycythemia: Secondary | ICD-10-CM | POA: Diagnosis present

## 2018-10-28 DIAGNOSIS — Z79899 Other long term (current) drug therapy: Secondary | ICD-10-CM | POA: Diagnosis not present

## 2018-10-28 DIAGNOSIS — R42 Dizziness and giddiness: Secondary | ICD-10-CM | POA: Insufficient documentation

## 2018-10-28 DIAGNOSIS — R002 Palpitations: Secondary | ICD-10-CM | POA: Diagnosis not present

## 2018-10-28 DIAGNOSIS — D45 Polycythemia vera: Secondary | ICD-10-CM

## 2018-10-28 DIAGNOSIS — Z9981 Dependence on supplemental oxygen: Secondary | ICD-10-CM | POA: Insufficient documentation

## 2018-10-28 DIAGNOSIS — Z7902 Long term (current) use of antithrombotics/antiplatelets: Secondary | ICD-10-CM | POA: Insufficient documentation

## 2018-10-28 DIAGNOSIS — J449 Chronic obstructive pulmonary disease, unspecified: Secondary | ICD-10-CM | POA: Insufficient documentation

## 2018-10-28 DIAGNOSIS — D509 Iron deficiency anemia, unspecified: Secondary | ICD-10-CM | POA: Diagnosis not present

## 2018-10-28 LAB — CBC WITH DIFFERENTIAL (CANCER CENTER ONLY)
ABS IMMATURE GRANULOCYTES: 0.01 10*3/uL (ref 0.00–0.07)
Basophils Absolute: 0 10*3/uL (ref 0.0–0.1)
Basophils Relative: 1 %
EOS PCT: 2 %
Eosinophils Absolute: 0.1 10*3/uL (ref 0.0–0.5)
HCT: 36.8 % (ref 36.0–46.0)
HEMOGLOBIN: 10.7 g/dL — AB (ref 12.0–15.0)
Immature Granulocytes: 0 %
LYMPHS PCT: 27 %
Lymphs Abs: 1.5 10*3/uL (ref 0.7–4.0)
MCH: 21.9 pg — AB (ref 26.0–34.0)
MCHC: 29.1 g/dL — AB (ref 30.0–36.0)
MCV: 75.3 fL — ABNORMAL LOW (ref 80.0–100.0)
MONO ABS: 0.6 10*3/uL (ref 0.1–1.0)
MONOS PCT: 10 %
NEUTROS ABS: 3.2 10*3/uL (ref 1.7–7.7)
Neutrophils Relative %: 60 %
Platelet Count: 275 10*3/uL (ref 150–400)
RBC: 4.89 MIL/uL (ref 3.87–5.11)
RDW: 14.9 % (ref 11.5–15.5)
WBC: 5.4 10*3/uL (ref 4.0–10.5)
nRBC: 0 % (ref 0.0–0.2)

## 2018-10-28 LAB — CMP (CANCER CENTER ONLY)
ALK PHOS: 64 U/L (ref 38–126)
ALT: 16 U/L (ref 0–44)
ANION GAP: 7 (ref 5–15)
AST: 19 U/L (ref 15–41)
Albumin: 4.2 g/dL (ref 3.5–5.0)
BILIRUBIN TOTAL: 0.5 mg/dL (ref 0.3–1.2)
BUN: 14 mg/dL (ref 8–23)
CO2: 29 mmol/L (ref 22–32)
CREATININE: 0.83 mg/dL (ref 0.44–1.00)
Calcium: 9 mg/dL (ref 8.9–10.3)
Chloride: 104 mmol/L (ref 98–111)
GFR, Estimated: >60 mL/min (ref >60)
Glucose, Bld: 99 mg/dL (ref 70–99)
Potassium: 4.5 mmol/L (ref 3.5–5.1)
Sodium: 140 mmol/L (ref 135–145)
TOTAL PROTEIN: 6.4 g/dL — AB (ref 6.5–8.1)

## 2018-10-28 NOTE — Progress Notes (Signed)
Hematology and Oncology Follow Up Visit  PERLA ECHAVARRIA [id number] 20-Dec-1950 67 y.o. 10/28/2018   Principle Diagnosis:  Secondary polycythemia vera- JAK2 negative  Current Therapy:   Phlebotomy to maintain hematocrit less than 38% Plavix 75 mg by mouth daily   Interim History: Ms. Holsclaw is here today with her husband for follow-up. She recently finished antibiotics for a middle ear infection.  Her vertigo was exacerbated by the ear infection. She took Meclizine as needed.  She states that she will be having surgery with urology Dr. Matilde Sprang a ureterocele.  She takes Claritin for any itching.  Her SOB is stable on supplemental O2 24 hours a day. Palpitations improved with metoprolol.  Hct today is 36.8%.  No fever, chills, n/v, cough, rash, chest pain, abdominal pain or changes in bowel or bladder habits.  No swelling, tenderness, numbness or tingling in her extremities.  No lymphadenopathy noted on exam.  No episodes of bleeding, no bruising or petechiae.  Her appetite comes and goes. She is staying well hydrated. Her weight is stable.   ECOG Performance Status: 1 - Symptomatic but completely ambulatory  Medications:  Allergies as of 10/28/2018      Reactions   Tizanidine Other (See Comments)   panic   Qvar [beclomethasone]    Spiriva [tiotropium Bromide Monohydrate]    rash   Advair Diskus [fluticasone-salmeterol]    Chlorzoxazone    Epinephrine    Raises heart rate   Lamotrigine    Levofloxacin    Neurontin [gabapentin]    arthralgia   Protriptyline Hcl    Severe constipation   Ropinirole Hcl    arthralgia   Tamiflu [oseltamivir Phosphate] Other (See Comments)   "like I was having a stroke"   Verapamil    Zonegran    Mood swings   Advair Diskus [fluticasone-salmeterol] Rash   Baclofen Rash   Chlorzoxazone Rash   Levofloxacin Rash   Protriptyline Rash   Ropinirole Rash      Medication List        Accurate as of 10/28/18  1:37 PM. Always use your  most recent med list.          albuterol 108 (90 Base) MCG/ACT inhaler Commonly known as:  PROVENTIL HFA;VENTOLIN HFA Inhale 2 puffs into the lungs every 6 (six) hours as needed for wheezing or shortness of breath.   albuterol (2.5 MG/3ML) 0.083% nebulizer solution Commonly known as:  PROVENTIL Take 3 mLs (2.5 mg total) by nebulization every 6 (six) hours as needed for wheezing or shortness of breath.   azelastine 0.1 % nasal spray Commonly known as:  ASTELIN Place 1 spray into both nostrils 2 (two) times daily.   beclomethasone 42 MCG/SPRAY nasal spray Commonly known as:  BECONASE-AQ Place 2 sprays into both nostrils 2 (two) times daily. Dose is for each nostril.   benzonatate 100 MG capsule Commonly known as:  TESSALON Take 1 capsule (100 mg total) by mouth every 6 (six) hours as needed for cough.   buPROPion 300 MG 24 hr tablet Commonly known as:  WELLBUTRIN XL Take 1 tablet (300 mg total) by mouth daily with breakfast.   CALCIUM 1200 PO Take 1,250 mg by mouth daily.   CENTRUM SILVER tablet Take 1 tablet by mouth daily.   clopidogrel 75 MG tablet Commonly known as:  PLAVIX Take 1 tablet (75 mg total) by mouth daily.   cyclobenzaprine 5 MG tablet Commonly known as:  FLEXERIL Take 5 mg by mouth 2 (two) times daily  as needed for muscle spasms. Phillips   ESTRACE VAGINAL 0.1 MG/GM vaginal cream Generic drug:  estradiol Place 1 Applicatorful vaginally daily. Small amount each dose per pt   ezetimibe 10 MG tablet Commonly known as:  ZETIA TAKE 1 TABLET BY MOUTH DAILY.   fexofenadine 180 MG tablet Commonly known as:  ALLEGRA Take 1 tablet (180 mg total) by mouth daily as needed for allergies. As needed   Fish Oil 1200 MG Caps Take 1,200 mg by mouth at bedtime.   hyoscyamine 0.125 MG Tbdp disintergrating tablet Commonly known as:  ANASPAZ Place 1 tablet (0.125 mg total) under the tongue 2 (two) times daily as needed.   lidocaine 5 % Commonly known as:   LIDODERM Place 1 patch onto the skin as needed (pain). Remove & Discard patch within 12 hours or as directed by MD   LORazepam 1 MG tablet Commonly known as:  ATIVAN Take 1 tablet (1 mg total) by mouth 2 (two) times daily. May take extra 3rd dose if needed   magnesium gluconate 500 MG tablet Commonly known as:  MAGONATE Take 500 mg by mouth daily as needed (constipation).   meclizine 25 MG tablet Commonly known as:  ANTIVERT Take 1 tablet (25 mg total) by mouth 3 (three) times daily as needed for dizziness. 3 month supply   metoprolol succinate 50 MG 24 hr tablet Commonly known as:  TOPROL-XL Take 1 tablet (50 mg total) by mouth daily with breakfast.   morphine 15 MG tablet Commonly known as:  MSIR Take 15 mg by mouth every 4 (four) hours as needed for severe pain.   morphine 30 MG 12 hr tablet Commonly known as:  MS CONTIN Take 30 mg by mouth every 12 (twelve) hours.   Nebulizer Devi Use as directed   OXYGEN Inhale 2.5 L/hr into the lungs continuous.   pantoprazole 40 MG tablet Commonly known as:  PROTONIX TAKE 1 TABLET (40 MG TOTAL) BY MOUTH DAILY.   pravastatin 40 MG tablet Commonly known as:  PRAVACHOL Take 1 tablet (40 mg total) by mouth at bedtime.   promethazine 25 MG tablet Commonly known as:  PHENERGAN Take 25 mg by mouth every 8 (eight) hours as needed for nausea or vomiting.   SYSTANE 0.4-0.3 % Soln Generic drug:  Polyethyl Glycol-Propyl Glycol Apply 1 drop to eye daily as needed (dry eyes).   TART CHERRY ADVANCED PO Take 5 mLs by mouth daily. Juice Concentrate- 1tsp daily   ZOVIRAX 5 % Generic drug:  acyclovir ointment Apply 1 application topically as needed (flair).       Allergies:  Allergies  Allergen Reactions  . Tizanidine Other (See Comments)    panic  . Qvar [Beclomethasone]   . Spiriva [Tiotropium Bromide Monohydrate]     rash  . Advair Diskus [Fluticasone-Salmeterol]   . Chlorzoxazone   . Epinephrine     Raises heart rate  .  Lamotrigine   . Levofloxacin   . Neurontin [Gabapentin]     arthralgia  . Protriptyline Hcl     Severe constipation  . Ropinirole Hcl     arthralgia  . Tamiflu [Oseltamivir Phosphate] Other (See Comments)    "like I was having a stroke"  . Verapamil   . Zonegran     Mood swings  . Advair Diskus [Fluticasone-Salmeterol] Rash  . Baclofen Rash  . Chlorzoxazone Rash  . Levofloxacin Rash  . Protriptyline Rash  . Ropinirole Rash    Past Medical History, Surgical history, Social history,  and Family History were reviewed and updated.  Review of Systems: All other 10 point review of systems is negative.   Physical Exam:  vitals were not taken for this visit.   Wt Readings from Last 3 Encounters:  10/26/18 207 lb (93.9 kg)  10/02/18 210 lb 9.6 oz (95.5 kg)  09/29/18 203 lb (92.1 kg)    Ocular: Sclerae unicteric, pupils equal, round and reactive to light Ear-nose-throat: Oropharynx clear, dentition fair Lymphatic: No cervical, supraclavicular or axillary adenopathy Lungs no rales or rhonchi, good excursion bilaterally Heart regular rate and rhythm, no murmur appreciated Abd soft, nontender, positive bowel sounds, no liver or spleen tip palpated on exam, no fluid wave  MSK no focal spinal tenderness, no joint edema Neuro: non-focal, well-oriented, appropriate affect Breasts: Deferred   Lab Results  Component Value Date   WBC 5.4 10/28/2018   HGB 10.7 (L) 10/28/2018   HCT 36.8 10/28/2018   MCV 75.3 (L) 10/28/2018   PLT 275 10/28/2018   Lab Results  Component Value Date   FERRITIN <4 (L) 09/29/2018   IRON 40 (L) 09/29/2018   TIBC 500 (H) 09/29/2018   UIBC 460 (H) 09/29/2018   IRONPCTSAT 8 (L) 09/29/2018   Lab Results  Component Value Date   RETICCTPCT 1.1 04/27/2015   RBC 4.89 10/28/2018   RETICCTABS 55.1 04/27/2015   No results found for: KPAFRELGTCHN, LAMBDASER, KAPLAMBRATIO No results found for: Kandis Cocking, IGMSERUM No results found for: Odetta Pink, SPEI   Chemistry      Component Value Date/Time   NA 141 09/29/2018 1420   NA 147 (H) 10/08/2017 1410   NA 141 02/17/2017 1132   K 4.7 09/29/2018 1420   K 4.6 10/08/2017 1410   K 5.3 (H) 02/17/2017 1132   CL 105 09/29/2018 1420   CL 106 10/08/2017 1410   CO2 27 09/29/2018 1420   CO2 30 10/08/2017 1410   CO2 29 02/17/2017 1132   BUN 13 09/29/2018 1420   BUN 15 10/08/2017 1410   BUN 17.8 02/17/2017 1132   CREATININE 0.86 09/29/2018 1420   CREATININE 1.0 10/08/2017 1410   CREATININE 0.8 02/17/2017 1132      Component Value Date/Time   CALCIUM 9.5 09/29/2018 1420   CALCIUM 9.0 10/08/2017 1410   CALCIUM 9.3 02/17/2017 1132   ALKPHOS 72 09/29/2018 1420   ALKPHOS 59 10/08/2017 1410   ALKPHOS 79 02/17/2017 1132   AST 25 09/29/2018 1420   AST 22 02/17/2017 1132   ALT 20 09/29/2018 1420   ALT 26 10/08/2017 1410   ALT 20 02/17/2017 1132   BILITOT 0.4 09/29/2018 1420   BILITOT 0.39 02/17/2017 1132       Impression and Plan: Ms. Nakamura is a very pleasant 67 yo caucasian female with secondary polycythemia (JAK2 negative) due to COPD. Hct today is stable at 36.8%.  No phlebotomy needed this visit.  We will see her in another month for follow-up.  They will contact our office with any questions or concerns. We can certainly see her sooner if need be.   [name], NP 11/27/20191:37 PM

## 2018-10-30 LAB — IRON AND TIBC
IRON: 36 ug/dL — AB (ref 41–142)
Saturation Ratios: 7 % — ABNORMAL LOW (ref 21–57)
TIBC: 479 ug/dL — AB (ref 236–444)
UIBC: 443 ug/dL — AB (ref 120–384)

## 2018-10-30 LAB — FERRITIN

## 2018-11-01 ENCOUNTER — Encounter: Payer: Self-pay | Admitting: Family

## 2018-11-02 ENCOUNTER — Other Ambulatory Visit: Payer: Self-pay | Admitting: Internal Medicine

## 2018-11-02 MED ORDER — LORATADINE 10 MG PO TABS: 10.0000 mg | ORAL_TABLET | Freq: Every day | ORAL | 11 refills | Status: DC

## 2018-11-02 MED ORDER — LORATADINE 10 MG PO TABS: 10.0000 mg | ORAL_TABLET | Freq: Every day | ORAL | 4 refills | Status: DC

## 2018-11-02 NOTE — Telephone Encounter (Signed)
Hey Dr. Annamaria Boots, Clariten controlling itching and other allergy issues well. would you send a RX to [facility] for me, like you did the [facility]. would appreciate it so much, and would love to save the [facility] cost.  thank you for all you do to help me over the years. we have seen the highs and lows and you always come out on top. hope your new move takes less work off you and makes everyone happier in the new space. oh, and along with a simpler arrangement of chairs out front to widening the aisles for the buggies and electric rides, ask the building managers to add an hydrollic control to open the doors. less skinned ankles, less shoulder injury muscleling the heavy doors, less need realigning the buggies, and less PTSD from the service dogs who get caught in the doors. we are thankful for the first floor access.    Happy Holidays and Blessed New Year,   Natalie Hendrix   ________________________________________________________________________________________  Dr. Annamaria Boots, I have attached the patients e-mail above concerning her claritin, please advise and I can contact the patient back. Thank you.

## 2018-11-03 ENCOUNTER — Other Ambulatory Visit: Payer: Self-pay | Admitting: Family

## 2018-11-06 ENCOUNTER — Telehealth: Payer: Self-pay | Admitting: Internal Medicine

## 2018-11-06 MED ORDER — AMOXICILLIN-POT CLAVULANATE 875-125 MG PO TABS: 1.0000 | ORAL_TABLET | Freq: Two times a day (BID) | ORAL | 0 refills | Status: DC

## 2018-11-06 NOTE — Telephone Encounter (Signed)
Called and spoke with patient. She has a productive cough with yellow, green mucus that started yesterday.  She is taking Mucinex and started back on Tessalon.  She denies fever at this time, but had a temp of 99-100 earlier today.  She is requesting something be sent in for her.  Will route to Dr. Annamaria Boots  Allergies  Allergen Reactions  . Tizanidine Other (See Comments)    panic  . Qvar [Beclomethasone]   . Spiriva [Tiotropium Bromide Monohydrate]     rash  . Advair Diskus [Fluticasone-Salmeterol]   . Chlorzoxazone   . Epinephrine     Raises heart rate  . Lamotrigine   . Levofloxacin   . Neurontin [Gabapentin]     arthralgia  . Protriptyline Hcl     Severe constipation  . Ropinirole Hcl     arthralgia  . Tamiflu [Oseltamivir Phosphate] Other (See Comments)    "like I was having a stroke"  . Verapamil   . Zonegran     Mood swings  . Advair Diskus [Fluticasone-Salmeterol] Rash  . Baclofen Rash  . Chlorzoxazone Rash  . Levofloxacin Rash  . Protriptyline Rash  . Ropinirole Rash   Current Outpatient Medications on File Prior to Visit  Medication Sig Dispense Refill  . albuterol (PROAIR HFA) 108 (90 Base) MCG/ACT inhaler Inhale 2 puffs into the lungs every 6 (six) hours as needed for wheezing or shortness of breath. 3 Inhaler 3  . albuterol (PROVENTIL) (2.5 MG/3ML) 0.083% nebulizer solution Take 3 mLs (2.5 mg total) by nebulization every 6 (six) hours as needed for wheezing or shortness of breath. 1080 mL 1  . azelastine (ASTELIN) 0.1 % nasal spray Place 1 spray into both nostrils 2 (two) times daily. 90 mL 3  . beclomethasone (BECONASE-AQ) 42 MCG/SPRAY nasal spray Place 2 sprays into both nostrils 2 (two) times daily. Dose is for each nostril. 75 g 2  . benzonatate (TESSALON) 100 MG capsule Take 1 capsule (100 mg total) by mouth every 6 (six) hours as needed for cough. 360 capsule 3  . buPROPion (WELLBUTRIN XL) 300 MG 24 hr tablet Take 1 tablet (300 mg total) by mouth daily with  breakfast. 90 tablet 3  . Calcium Carbonate-Vit D-Min (CALCIUM 1200 PO) Take 1,250 mg by mouth daily.     . clopidogrel (PLAVIX) 75 MG tablet Take 1 tablet (75 mg total) by mouth daily. 90 tablet 3  . cyclobenzaprine (FLEXERIL) 5 MG tablet Take 5 mg by mouth 2 (two) times daily as needed for muscle spasms. Phillips  0  . doxycycline (VIBRA-TABS) 100 MG tablet doxycycline hyclate 100 mg tablet    . ESTRACE VAGINAL 0.1 MG/GM vaginal cream Place 1 Applicatorful vaginally daily. Small amount each dose per pt    . ezetimibe (ZETIA) 10 MG tablet TAKE 1 TABLET BY MOUTH DAILY. 90 tablet 3  . fexofenadine (ALLEGRA) 180 MG tablet Take 1 tablet (180 mg total) by mouth daily as needed for allergies. As needed 90 tablet 3  . hyoscyamine (ANASPAZ) 0.125 MG TBDP disintergrating tablet Place 1 tablet (0.125 mg total) under the tongue 2 (two) times daily as needed. (Patient taking differently: Place 0.125 mg under the tongue 2 (two) times daily as needed for bladder spasms or cramping. ) 180 tablet 0  . lidocaine (LIDODERM) 5 % Place 1 patch onto the skin as needed (pain). Remove & Discard patch within 12 hours or as directed by MD    . loratadine (CLARITIN) 10 MG tablet Take 1 tablet (  10 mg total) by mouth daily. 30 tablet 11  . LORazepam (ATIVAN) 1 MG tablet Take 1 tablet (1 mg total) by mouth 2 (two) times daily. May take extra 3rd dose if needed (Patient taking differently: Take 1 mg by mouth daily as needed. May take extra 3rd dose if needed) 75 tablet 0  . magnesium gluconate (MAGONATE) 500 MG tablet Take 500 mg by mouth daily as needed (constipation).     . meclizine (ANTIVERT) 25 MG tablet Take 1 tablet (25 mg total) by mouth 3 (three) times daily as needed for dizziness. 3 month supply (Patient taking differently: Take 25 mg by mouth 3 (three) times daily. 3 month supply) 60 tablet 0  . metoprolol succinate (TOPROL-XL) 50 MG 24 hr tablet Take 1 tablet (50 mg total) by mouth daily with breakfast. (Patient  taking differently: Take 75 mg by mouth daily with breakfast. ) 90 tablet 1  . Misc Natural Products (TART CHERRY ADVANCED PO) Take 5 mLs by mouth daily. Juice Concentrate- 1tsp daily    . morphine (MS CONTIN) 30 MG 12 hr tablet Take 30 mg by mouth every 12 (twelve) hours.   0  . morphine (MSIR) 15 MG tablet Take 15 mg by mouth every 4 (four) hours as needed for severe pain.    . Multiple Vitamins-Minerals (CENTRUM SILVER) tablet Take 1 tablet by mouth daily.    . Omega-3 Fatty Acids (FISH OIL) 1200 MG CAPS Take 1,200 mg by mouth at bedtime.     . OXYGEN Inhale 2.5 L/hr into the lungs continuous.    . pantoprazole (PROTONIX) 40 MG tablet TAKE 1 TABLET (40 MG TOTAL) BY MOUTH DAILY. 90 tablet 3  . Polyethyl Glycol-Propyl Glycol (SYSTANE) 0.4-0.3 % SOLN Apply 1 drop to eye daily as needed (dry eyes).     . pravastatin (PRAVACHOL) 40 MG tablet Take 1 tablet (40 mg total) by mouth at bedtime. 90 tablet 3  . promethazine (PHENERGAN) 25 MG tablet Take 25 mg by mouth every 8 (eight) hours as needed for nausea or vomiting.   1  . Respiratory Therapy Supplies (NEBULIZER) DEVI Use as directed 1 each 0  . ZOVIRAX 5 % Apply 1 application topically as needed (flair).      Current Facility-Administered Medications on File Prior to Visit  Medication Dose Route Frequency Provider Last Rate Last Dose  . 0.9 %  sodium chloride infusion  1,000 mL Intravenous Once Preston, Holli Humbles, NP

## 2018-11-06 NOTE — Telephone Encounter (Signed)
Offer augmentin 875 mg, # 14, 1 twice daily 

## 2018-11-06 NOTE — Telephone Encounter (Signed)
ATC pt, no answer. Left message for pt to call back.  Sent Rx in to [facility].

## 2018-11-06 NOTE — Telephone Encounter (Signed)
.  Called pt and advised message from the provider. Pt understood and verbalized understanding. Nothing further is needed.   Rx was sent into the pharmacy.

## 2018-11-26 ENCOUNTER — Encounter: Payer: Self-pay | Admitting: Family

## 2018-11-26 ENCOUNTER — Inpatient Hospital Stay: Payer: Medicare Other | Attending: Family

## 2018-11-26 ENCOUNTER — Inpatient Hospital Stay: Payer: Medicare Other | Attending: Hematology & Oncology | Admitting: Family

## 2018-11-26 ENCOUNTER — Inpatient Hospital Stay: Payer: Medicare Other

## 2018-11-26 VITALS — BP 130/58 | HR 85 | Temp 98.2°F | Resp 16 | Ht 67.0 in | Wt 207.5 lb

## 2018-11-26 VITALS — BP 112/70 | HR 74

## 2018-11-26 DIAGNOSIS — D500 Iron deficiency anemia secondary to blood loss (chronic): Secondary | ICD-10-CM

## 2018-11-26 DIAGNOSIS — Z79899 Other long term (current) drug therapy: Secondary | ICD-10-CM | POA: Insufficient documentation

## 2018-11-26 DIAGNOSIS — Z9981 Dependence on supplemental oxygen: Secondary | ICD-10-CM | POA: Insufficient documentation

## 2018-11-26 DIAGNOSIS — Z7902 Long term (current) use of antithrombotics/antiplatelets: Secondary | ICD-10-CM | POA: Insufficient documentation

## 2018-11-26 DIAGNOSIS — D751 Secondary polycythemia: Secondary | ICD-10-CM | POA: Insufficient documentation

## 2018-11-26 DIAGNOSIS — D45 Polycythemia vera: Secondary | ICD-10-CM

## 2018-11-26 LAB — CBC WITH DIFFERENTIAL (CANCER CENTER ONLY)
ABS IMMATURE GRANULOCYTES: 0.01 10*3/uL (ref 0.00–0.07)
Basophils Absolute: 0 10*3/uL (ref 0.0–0.1)
Basophils Relative: 1 %
Eosinophils Absolute: 0.1 10*3/uL (ref 0.0–0.5)
Eosinophils Relative: 2 %
HEMATOCRIT: 38.5 % (ref 36.0–46.0)
Hemoglobin: 11 g/dL — ABNORMAL LOW (ref 12.0–15.0)
Immature Granulocytes: 0 %
LYMPHS ABS: 1 10*3/uL (ref 0.7–4.0)
LYMPHS PCT: 21 %
MCH: 21.2 pg — AB (ref 26.0–34.0)
MCHC: 28.6 g/dL — ABNORMAL LOW (ref 30.0–36.0)
MCV: 74.3 fL — ABNORMAL LOW (ref 80.0–100.0)
MONO ABS: 0.4 10*3/uL (ref 0.1–1.0)
Monocytes Relative: 9 %
NEUTROS ABS: 3.1 10*3/uL (ref 1.7–7.7)
Neutrophils Relative %: 67 %
Platelet Count: 248 10*3/uL (ref 150–400)
RBC: 5.18 MIL/uL — AB (ref 3.87–5.11)
RDW: 15.7 % — ABNORMAL HIGH (ref 11.5–15.5)
WBC: 4.6 10*3/uL (ref 4.0–10.5)
nRBC: 0 % (ref 0.0–0.2)

## 2018-11-26 LAB — CMP (CANCER CENTER ONLY)
ALBUMIN: 4 g/dL (ref 3.5–5.0)
ALK PHOS: 61 U/L (ref 38–126)
ALT: 15 U/L (ref 0–44)
AST: 19 U/L (ref 15–41)
Anion gap: 5 (ref 5–15)
BUN: 14 mg/dL (ref 8–23)
CALCIUM: 8.8 mg/dL — AB (ref 8.9–10.3)
CO2: 28 mmol/L (ref 22–32)
Chloride: 107 mmol/L (ref 98–111)
Creatinine: 0.8 mg/dL (ref 0.44–1.00)
GFR, Estimated: >60 mL/min (ref >60)
GLUCOSE: 86 mg/dL (ref 70–99)
Potassium: 4.2 mmol/L (ref 3.5–5.1)
SODIUM: 140 mmol/L (ref 135–145)
Total Bilirubin: 0.4 mg/dL (ref 0.3–1.2)
Total Protein: 6.7 g/dL (ref 6.5–8.1)

## 2018-11-26 MED ORDER — SODIUM CHLORIDE 0.9 % IV SOLN
Freq: Once | INTRAVENOUS | Status: AC
  Administered 2018-11-26: 16:00:00 via INTRAVENOUS
  Filled 2018-11-26: qty 250

## 2018-11-26 NOTE — Patient Instructions (Signed)

## 2018-11-26 NOTE — Progress Notes (Signed)
Natalie Hendrix presents today for phlebotomy per MD orders. Phlebotomy procedure started at 1553 and ended at 1609. 508 grams removed using 20g IV cath. Patient observed for 30 minutes after procedure without any incident. Patient tolerated procedure well. IV needle removed intact.

## 2018-11-26 NOTE — Progress Notes (Signed)
Hematology and Oncology Follow Up Visit  Natalie Hendrix [id number] Dec 09, 1950 67 y.o. 11/26/2018   Principle Diagnosis:  Secondary polycythemia vera- JAK2 negative  Current Therapy:   Phlebotomy to maintain hematocrit less than 38% Plavix 75 mg by mouth daily   Interim History:  Natalie Hendrix is here today with her husband for follow-up. She is having frequent hot flashes, intermittent migraines and "brain fog".  Hct today is 38.5%.  She was weaned down on her metoprolol and has noticed that she is having increased palpitations. She has an appointment to see a new PCP Horald Pollen, MD. She also plans to get an appointment with her husband's cardiologist Dr. Ellyn Hack.  She is stable on 2-3 L supplemental O2 24 hours a day.  No fever, chills, n/v, cough, rash, dizziness, SOB, chest pain, palpitations, abdominal pain or changes in bowel or bladder habits.  No swelling or tenderness in her extremities. No c/o numbness or tingling at this time.  No lymphadenopathy noted on exam.  Her appetite comes and goes. She is staying hydrated. Her weight is stable.   ECOG Performance Status: 1 - Symptomatic but completely ambulatory  Medications:  Allergies as of 11/26/2018      Reactions   Tizanidine Other (See Comments)   panic   Qvar [beclomethasone]    Spiriva [tiotropium Bromide Monohydrate]    rash   Advair Diskus [fluticasone-salmeterol]    Chlorzoxazone    Epinephrine    Raises heart rate   Lamotrigine    Levofloxacin    Neurontin [gabapentin]    arthralgia   Protriptyline Hcl    Severe constipation   Ropinirole Hcl    arthralgia   Tamiflu [oseltamivir Phosphate] Other (See Comments)   "like I was having a stroke"   Verapamil    Zonegran    Mood swings   Advair Diskus [fluticasone-salmeterol] Rash   Baclofen Rash   Chlorzoxazone Rash   Levofloxacin Rash   Protriptyline Rash   Ropinirole Rash      Medication List       Accurate as of November 26, 2018  3:01 PM. Always  use your most recent med list.        albuterol 108 (90 Base) MCG/ACT inhaler Commonly known as:  PROAIR HFA Inhale 2 puffs into the lungs every 6 (six) hours as needed for wheezing or shortness of breath.   albuterol (2.5 MG/3ML) 0.083% nebulizer solution Commonly known as:  PROVENTIL Take 3 mLs (2.5 mg total) by nebulization every 6 (six) hours as needed for wheezing or shortness of breath.   amoxicillin-clavulanate 875-125 MG tablet Commonly known as:  AUGMENTIN Take 1 tablet by mouth 2 (two) times daily.   azelastine 0.1 % nasal spray Commonly known as:  ASTELIN Place 1 spray into both nostrils 2 (two) times daily.   beclomethasone 42 MCG/SPRAY nasal spray Commonly known as:  BECONASE-AQ Place 2 sprays into both nostrils 2 (two) times daily. Dose is for each nostril.   benzonatate 100 MG capsule Commonly known as:  TESSALON Take 1 capsule (100 mg total) by mouth every 6 (six) hours as needed for cough.   buPROPion 300 MG 24 hr tablet Commonly known as:  WELLBUTRIN XL Take 1 tablet (300 mg total) by mouth daily with breakfast.   CALCIUM 1200 PO Take 1,250 mg by mouth daily.   CENTRUM SILVER tablet Take 1 tablet by mouth daily.   clopidogrel 75 MG tablet Commonly known as:  PLAVIX Take 1 tablet (75 mg total)  by mouth daily.   cyclobenzaprine 5 MG tablet Commonly known as:  FLEXERIL Take 5 mg by mouth 2 (two) times daily as needed for muscle spasms. Phillips   doxycycline 100 MG tablet Commonly known as:  VIBRA-TABS doxycycline hyclate 100 mg tablet   ESTRACE VAGINAL 0.1 MG/GM vaginal cream Generic drug:  estradiol Place 1 Applicatorful vaginally daily. Small amount each dose per pt   ezetimibe 10 MG tablet Commonly known as:  ZETIA TAKE 1 TABLET BY MOUTH DAILY.   fexofenadine 180 MG tablet Commonly known as:  ALLEGRA Take 1 tablet (180 mg total) by mouth daily as needed for allergies. As needed   Fish Oil 1200 MG Caps Take 1,200 mg by mouth at  bedtime.   hyoscyamine 0.125 MG Tbdp disintergrating tablet Commonly known as:  ANASPAZ Place 1 tablet (0.125 mg total) under the tongue 2 (two) times daily as needed.   lidocaine 5 % Commonly known as:  LIDODERM Place 1 patch onto the skin as needed (pain). Remove & Discard patch within 12 hours or as directed by MD   loratadine 10 MG tablet Commonly known as:  CLARITIN Take 1 tablet (10 mg total) by mouth daily.   LORazepam 1 MG tablet Commonly known as:  ATIVAN Take 1 tablet (1 mg total) by mouth 2 (two) times daily. May take extra 3rd dose if needed   magnesium gluconate 500 MG tablet Commonly known as:  MAGONATE Take 500 mg by mouth daily as needed (constipation).   meclizine 25 MG tablet Commonly known as:  ANTIVERT Take 1 tablet (25 mg total) by mouth 3 (three) times daily as needed for dizziness. 3 month supply   metoprolol succinate 50 MG 24 hr tablet Commonly known as:  TOPROL-XL Take 1 tablet (50 mg total) by mouth daily with breakfast.   morphine 15 MG tablet Commonly known as:  MSIR Take 15 mg by mouth every 4 (four) hours as needed for severe pain.   morphine 30 MG 12 hr tablet Commonly known as:  MS CONTIN Take 30 mg by mouth every 12 (twelve) hours.   Nebulizer Devi Use as directed   OXYGEN Inhale 2.5 L/hr into the lungs continuous.   pantoprazole 40 MG tablet Commonly known as:  PROTONIX TAKE 1 TABLET (40 MG TOTAL) BY MOUTH DAILY.   pravastatin 40 MG tablet Commonly known as:  PRAVACHOL Take 1 tablet (40 mg total) by mouth at bedtime.   promethazine 25 MG tablet Commonly known as:  PHENERGAN Take 25 mg by mouth every 8 (eight) hours as needed for nausea or vomiting.   SYSTANE 0.4-0.3 % Soln Generic drug:  Polyethyl Glycol-Propyl Glycol Apply 1 drop to eye daily as needed (dry eyes).   TART CHERRY ADVANCED PO Take 5 mLs by mouth daily. Juice Concentrate- 1tsp daily   ZOVIRAX 5 % Generic drug:  acyclovir ointment Apply 1 application  topically as needed (flair).       Allergies:  Allergies  Allergen Reactions  . Tizanidine Other (See Comments)    panic  . Qvar [Beclomethasone]   . Spiriva [Tiotropium Bromide Monohydrate]     rash  . Advair Diskus [Fluticasone-Salmeterol]   . Chlorzoxazone   . Epinephrine     Raises heart rate  . Lamotrigine   . Levofloxacin   . Neurontin [Gabapentin]     arthralgia  . Protriptyline Hcl     Severe constipation  . Ropinirole Hcl     arthralgia  . Tamiflu [Oseltamivir Phosphate] Other (See  Comments)    "like I was having a stroke"  . Verapamil   . Zonegran     Mood swings  . Advair Diskus [Fluticasone-Salmeterol] Rash  . Baclofen Rash  . Chlorzoxazone Rash  . Levofloxacin Rash  . Protriptyline Rash  . Ropinirole Rash    Past Medical History, Surgical history, Social history, and Family History were reviewed and updated.  Review of Systems: All other 10 point review of systems is negative.   Physical Exam:  height is 5\' 7"  (1.702 m) and weight is 207 lb 8 oz (94.1 kg). Her oral temperature is 98.2 F (36.8 C). Her blood pressure is 130/58 (abnormal) and her pulse is 85. Her respiration is 16 and oxygen saturation is 99%.   Wt Readings from Last 3 Encounters:  11/26/18 207 lb 8 oz (94.1 kg)  10/28/18 207 lb 4 oz (94 kg)  10/26/18 207 lb (93.9 kg)    Ocular: Sclerae unicteric, pupils equal, round and reactive to light Ear-nose-throat: Oropharynx clear, dentition fair Lymphatic: No cervical, supraclavicular or axillary adenopathy Lungs no rales or rhonchi, good excursion bilaterally Heart regular rate and rhythm, no murmur appreciated Abd soft, nontender, positive bowel sounds, no liver or spleen tip palpated on exam, no fluid wave  MSK no focal spinal tenderness, no joint edema Neuro: non-focal, well-oriented, appropriate affect Breasts: Deferred   Lab Results  Component Value Date   WBC 4.6 11/26/2018   HGB 11.0 (L) 11/26/2018   HCT 38.5 11/26/2018    MCV 74.3 (L) 11/26/2018   PLT 248 11/26/2018   Lab Results  Component Value Date   FERRITIN <4 (L) 10/28/2018   IRON 36 (L) 10/28/2018   TIBC 479 (H) 10/28/2018   UIBC 443 (H) 10/28/2018   IRONPCTSAT 7 (L) 10/28/2018   Lab Results  Component Value Date   RETICCTPCT 1.1 04/27/2015   RBC 5.18 (H) 11/26/2018   RETICCTABS 55.1 04/27/2015   No results found for: KPAFRELGTCHN, LAMBDASER, KAPLAMBRATIO No results found for: IGGSERUM, IGA, IGMSERUM No results found for: Odetta Pink, SPEI   Chemistry      Component Value Date/Time   NA 140 10/28/2018 1306   NA 147 (H) 10/08/2017 1410   NA 141 02/17/2017 1132   K 4.5 10/28/2018 1306   K 4.6 10/08/2017 1410   K 5.3 (H) 02/17/2017 1132   CL 104 10/28/2018 1306   CL 106 10/08/2017 1410   CO2 29 10/28/2018 1306   CO2 30 10/08/2017 1410   CO2 29 02/17/2017 1132   BUN 14 10/28/2018 1306   BUN 15 10/08/2017 1410   BUN 17.8 02/17/2017 1132   CREATININE 0.83 10/28/2018 1306   CREATININE 1.0 10/08/2017 1410   CREATININE 0.8 02/17/2017 1132      Component Value Date/Time   CALCIUM 9.0 10/28/2018 1306   CALCIUM 9.0 10/08/2017 1410   CALCIUM 9.3 02/17/2017 1132   ALKPHOS 64 10/28/2018 1306   ALKPHOS 59 10/08/2017 1410   ALKPHOS 79 02/17/2017 1132   AST 19 10/28/2018 1306   AST 22 02/17/2017 1132   ALT 16 10/28/2018 1306   ALT 26 10/08/2017 1410   ALT 20 02/17/2017 1132   BILITOT 0.5 10/28/2018 1306   BILITOT 0.39 02/17/2017 1132       Impression and Plan: Natalie Hendrix is a very pleasant 67 yo caucasian female with secondary polycythemia (JAK2 negative) due to COPD. Hct is 38.5 and she is symptomatic as mentioned above.  We will proceed  with phlebotomy and replacement fluids.  We will plan to see her in another month.  They will contact our office with any questions or concerns. We can certainly see her sooner if need be.   [name], NP 12/26/20193:01 PM

## 2018-11-27 LAB — FERRITIN

## 2018-11-27 LAB — IRON AND TIBC
Iron: 38 ug/dL — ABNORMAL LOW (ref 41–142)
SATURATION RATIOS: 8 % — AB (ref 21–57)
TIBC: 463 ug/dL — ABNORMAL HIGH (ref 236–444)
UIBC: 425 ug/dL — AB (ref 120–384)

## 2018-12-10 DIAGNOSIS — E119 Type 2 diabetes mellitus without complications: Secondary | ICD-10-CM | POA: Diagnosis not present

## 2018-12-10 DIAGNOSIS — J449 Chronic obstructive pulmonary disease, unspecified: Secondary | ICD-10-CM | POA: Diagnosis not present

## 2018-12-10 DIAGNOSIS — E785 Hyperlipidemia, unspecified: Secondary | ICD-10-CM | POA: Diagnosis not present

## 2018-12-10 DIAGNOSIS — I10 Essential (primary) hypertension: Secondary | ICD-10-CM | POA: Diagnosis not present

## 2018-12-10 DIAGNOSIS — I639 Cerebral infarction, unspecified: Secondary | ICD-10-CM | POA: Diagnosis not present

## 2018-12-17 ENCOUNTER — Other Ambulatory Visit: Payer: Self-pay | Admitting: *Deleted

## 2018-12-17 DIAGNOSIS — Z8673 Personal history of transient ischemic attack (TIA), and cerebral infarction without residual deficits: Secondary | ICD-10-CM

## 2018-12-17 DIAGNOSIS — D45 Polycythemia vera: Secondary | ICD-10-CM

## 2018-12-17 MED ORDER — CLOPIDOGREL BISULFATE 75 MG PO TABS: 75.0000 mg | ORAL_TABLET | Freq: Every day | ORAL | 0 refills | Status: DC

## 2018-12-17 MED ORDER — CLOPIDOGREL BISULFATE 75 MG PO TABS: 75.0000 mg | ORAL_TABLET | Freq: Every day | ORAL | 3 refills | Status: DC

## 2018-12-23 DIAGNOSIS — M542 Cervicalgia: Secondary | ICD-10-CM | POA: Diagnosis not present

## 2018-12-23 DIAGNOSIS — Z79891 Long term (current) use of opiate analgesic: Secondary | ICD-10-CM | POA: Diagnosis not present

## 2018-12-23 DIAGNOSIS — M4726 Other spondylosis with radiculopathy, lumbar region: Secondary | ICD-10-CM | POA: Diagnosis not present

## 2018-12-23 DIAGNOSIS — M791 Myalgia, unspecified site: Secondary | ICD-10-CM | POA: Diagnosis not present

## 2018-12-23 DIAGNOSIS — R51 Headache: Secondary | ICD-10-CM | POA: Diagnosis not present

## 2018-12-23 DIAGNOSIS — G894 Chronic pain syndrome: Secondary | ICD-10-CM | POA: Diagnosis not present

## 2018-12-24 ENCOUNTER — Encounter: Payer: Self-pay | Admitting: Family

## 2018-12-24 DIAGNOSIS — E785 Hyperlipidemia, unspecified: Secondary | ICD-10-CM | POA: Diagnosis not present

## 2018-12-24 DIAGNOSIS — Z0001 Encounter for general adult medical examination with abnormal findings: Secondary | ICD-10-CM | POA: Diagnosis not present

## 2018-12-24 DIAGNOSIS — R4189 Other symptoms and signs involving cognitive functions and awareness: Secondary | ICD-10-CM | POA: Diagnosis not present

## 2018-12-24 DIAGNOSIS — I639 Cerebral infarction, unspecified: Secondary | ICD-10-CM | POA: Diagnosis not present

## 2018-12-24 DIAGNOSIS — D45 Polycythemia vera: Secondary | ICD-10-CM | POA: Diagnosis not present

## 2018-12-24 DIAGNOSIS — I10 Essential (primary) hypertension: Secondary | ICD-10-CM | POA: Diagnosis not present

## 2018-12-24 DIAGNOSIS — J441 Chronic obstructive pulmonary disease with (acute) exacerbation: Secondary | ICD-10-CM | POA: Diagnosis not present

## 2018-12-24 DIAGNOSIS — R092 Respiratory arrest: Secondary | ICD-10-CM | POA: Diagnosis not present

## 2018-12-31 ENCOUNTER — Inpatient Hospital Stay: Payer: Medicare HMO

## 2018-12-31 ENCOUNTER — Inpatient Hospital Stay: Payer: Medicare HMO | Admitting: Hematology & Oncology

## 2019-01-03 NOTE — Assessment & Plan Note (Signed)
Discussed potential for phlebotomy to create anemia with potential impact on oxygen transport.  She is being followed by hematology. Plan-update flu vaccine.  Continue O2.

## 2019-01-03 NOTE — Assessment & Plan Note (Signed)
We discussed use of antihistamines, avoiding overdrying.  Suggested newer, nonsedating products.

## 2019-01-03 NOTE — Assessment & Plan Note (Signed)
She has felt controlled with no recent exacerbation.

## 2019-01-12 ENCOUNTER — Telehealth: Payer: Self-pay | Admitting: *Deleted

## 2019-01-12 NOTE — Telephone Encounter (Signed)
Patient has been battling an illness and is experiencing low grade fevers. She wants to know what is safe to take to help decrease the fever. Spoke with [name] NP. She would like patient to take Tylenol 650mg  q6h as needed for fever. Patient confirmed with teach back.

## 2019-01-18 DIAGNOSIS — R4189 Other symptoms and signs involving cognitive functions and awareness: Secondary | ICD-10-CM | POA: Diagnosis not present

## 2019-01-18 DIAGNOSIS — Z0001 Encounter for general adult medical examination with abnormal findings: Secondary | ICD-10-CM | POA: Diagnosis not present

## 2019-01-18 DIAGNOSIS — I10 Essential (primary) hypertension: Secondary | ICD-10-CM | POA: Diagnosis not present

## 2019-01-18 DIAGNOSIS — B349 Viral infection, unspecified: Secondary | ICD-10-CM | POA: Diagnosis not present

## 2019-01-20 ENCOUNTER — Inpatient Hospital Stay: Payer: Medicare HMO | Attending: Hematology & Oncology

## 2019-01-20 ENCOUNTER — Inpatient Hospital Stay: Payer: Medicare HMO

## 2019-01-20 ENCOUNTER — Other Ambulatory Visit: Payer: Self-pay

## 2019-01-20 ENCOUNTER — Inpatient Hospital Stay (HOSPITAL_BASED_OUTPATIENT_CLINIC_OR_DEPARTMENT_OTHER): Payer: Medicare HMO | Admitting: Hematology & Oncology

## 2019-01-20 VITALS — BP 129/81 | HR 79 | Temp 98.5°F | Resp 18 | Wt 208.0 lb

## 2019-01-20 DIAGNOSIS — N3946 Mixed incontinence: Secondary | ICD-10-CM | POA: Diagnosis not present

## 2019-01-20 DIAGNOSIS — Z9981 Dependence on supplemental oxygen: Secondary | ICD-10-CM | POA: Diagnosis not present

## 2019-01-20 DIAGNOSIS — D500 Iron deficiency anemia secondary to blood loss (chronic): Secondary | ICD-10-CM

## 2019-01-20 DIAGNOSIS — J449 Chronic obstructive pulmonary disease, unspecified: Secondary | ICD-10-CM | POA: Insufficient documentation

## 2019-01-20 DIAGNOSIS — D751 Secondary polycythemia: Secondary | ICD-10-CM | POA: Diagnosis not present

## 2019-01-20 DIAGNOSIS — D45 Polycythemia vera: Secondary | ICD-10-CM

## 2019-01-20 DIAGNOSIS — R32 Unspecified urinary incontinence: Secondary | ICD-10-CM | POA: Diagnosis not present

## 2019-01-20 DIAGNOSIS — N952 Postmenopausal atrophic vaginitis: Secondary | ICD-10-CM | POA: Diagnosis not present

## 2019-01-20 DIAGNOSIS — D509 Iron deficiency anemia, unspecified: Secondary | ICD-10-CM | POA: Insufficient documentation

## 2019-01-20 DIAGNOSIS — N941 Unspecified dyspareunia: Secondary | ICD-10-CM | POA: Diagnosis not present

## 2019-01-20 DIAGNOSIS — N362 Urethral caruncle: Secondary | ICD-10-CM | POA: Diagnosis not present

## 2019-01-20 DIAGNOSIS — N63 Unspecified lump in unspecified breast: Secondary | ICD-10-CM

## 2019-01-20 LAB — CBC WITH DIFFERENTIAL (CANCER CENTER ONLY)
Abs Immature Granulocytes: 0.01 K/uL (ref 0.00–0.07)
Basophils Absolute: 0.1 K/uL (ref 0.0–0.1)
Basophils Relative: 1 %
Eosinophils Absolute: 0.1 K/uL (ref 0.0–0.5)
Eosinophils Relative: 2 %
HCT: 36.8 % (ref 36.0–46.0)
Hemoglobin: 10.6 g/dL — ABNORMAL LOW (ref 12.0–15.0)
Immature Granulocytes: 0 %
Lymphocytes Relative: 28 %
Lymphs Abs: 2 K/uL (ref 0.7–4.0)
MCH: 20.4 pg — ABNORMAL LOW (ref 26.0–34.0)
MCHC: 28.8 g/dL — ABNORMAL LOW (ref 30.0–36.0)
MCV: 70.9 fL — ABNORMAL LOW (ref 80.0–100.0)
Monocytes Absolute: 0.6 K/uL (ref 0.1–1.0)
Monocytes Relative: 8 %
Neutro Abs: 4.4 K/uL (ref 1.7–7.7)
Neutrophils Relative %: 61 %
Platelet Count: 319 K/uL (ref 150–400)
RBC: 5.19 MIL/uL — ABNORMAL HIGH (ref 3.87–5.11)
RDW: 17.6 % — ABNORMAL HIGH (ref 11.5–15.5)
WBC Count: 7.2 K/uL (ref 4.0–10.5)
nRBC: 0 % (ref 0.0–0.2)

## 2019-01-20 LAB — CMP (CANCER CENTER ONLY)
ALT: 18 U/L (ref 0–44)
AST: 22 U/L (ref 15–41)
Albumin: 4.4 g/dL (ref 3.5–5.0)
Alkaline Phosphatase: 60 U/L (ref 38–126)
Anion gap: 7 (ref 5–15)
BUN: 15 mg/dL (ref 8–23)
CO2: 26 mmol/L (ref 22–32)
Calcium: 9.2 mg/dL (ref 8.9–10.3)
Chloride: 107 mmol/L (ref 98–111)
Creatinine: 0.99 mg/dL (ref 0.44–1.00)
GFR, Est AFR Am: >60 mL/min
GFR, Estimated: 59 mL/min — ABNORMAL LOW
Glucose, Bld: 118 mg/dL — ABNORMAL HIGH (ref 70–99)
Potassium: 4.3 mmol/L (ref 3.5–5.1)
Sodium: 140 mmol/L (ref 135–145)
Total Bilirubin: 0.4 mg/dL (ref 0.3–1.2)
Total Protein: 6.5 g/dL (ref 6.5–8.1)

## 2019-01-20 NOTE — Progress Notes (Signed)
Hematology and Oncology Follow Up Visit  Natalie Hendrix [id number] 1951-04-01 68 y.o. 01/20/2019   Principle Diagnosis:  Secondary polycythemia vera- JAK2 negative  Current Therapy:   Phlebotomy to maintain hematocrit less than 38% Plavix 75 mg by mouth daily   Interim History:  Natalie Hendrix is here today with her husband for follow-up.  She seems to be doing pretty well.  She has had no specific complaints since we last saw her.  We last saw her back in December.  She was little bit concerned about a potential lump that she noted in her left breast.  She underwent a mammogram on 01/22/2019.  Mammogram was unremarkable.  It is recommended that she have a follow-up mammogram in 1 year.  Her iron studies that we did on her today show a ferritin less than 4 with an iron saturation of 6%.  We need to keep her iron levels on the low side.  She still has little bit of unsteadiness when she walks.  She does use a rolling walker.  She has had no fever.  There is been no cough.  She is had no change in bowel or bladder habits.  Overall, her performance status is ECOG 1.   Medications:  Allergies as of 01/20/2019      Reactions   Tizanidine Other (See Comments)   panic   Other Hives, Rash   boiron acteane   Qvar [beclomethasone]    Spiriva [tiotropium Bromide Monohydrate]    rash   Tiotropium Rash   rash   Advair Diskus [fluticasone-salmeterol]    Chlorzoxazone    Lamotrigine    Levofloxacin    Neurontin [gabapentin]    arthralgia   Protriptyline Hcl    Severe constipation   Ropinirole Hcl    arthralgia   Tamiflu [oseltamivir Phosphate] Other (See Comments)   "like I was having a stroke"   Verapamil    Zonegran    Mood swings   Advair Diskus [fluticasone-salmeterol] Rash   Baclofen Rash   Chlorzoxazone Rash   Epinephrine Palpitations   Raises heart rate Raises heart rate   Levofloxacin Rash   Protriptyline Rash   Ropinirole Rash      Medication List       Accurate  as of January 20, 2019  2:53 PM. Always use your most recent med list.        albuterol 108 (90 Base) MCG/ACT inhaler Commonly known as:  PROAIR HFA Inhale 2 puffs into the lungs every 6 (six) hours as needed for wheezing or shortness of breath.   albuterol (2.5 MG/3ML) 0.083% nebulizer solution Commonly known as:  PROVENTIL Take 3 mLs (2.5 mg total) by nebulization every 6 (six) hours as needed for wheezing or shortness of breath.   amoxicillin-clavulanate 875-125 MG tablet Commonly known as:  AUGMENTIN Take 1 tablet by mouth 2 (two) times daily.   azelastine 0.1 % nasal spray Commonly known as:  ASTELIN Place 1 spray into both nostrils 2 (two) times daily.   beclomethasone 42 MCG/SPRAY nasal spray Commonly known as:  BECONASE-AQ Place 2 sprays into both nostrils 2 (two) times daily. Dose is for each nostril.   benzonatate 100 MG capsule Commonly known as:  TESSALON Take 1 capsule (100 mg total) by mouth every 6 (six) hours as needed for cough.   buPROPion 300 MG 24 hr tablet Commonly known as:  WELLBUTRIN XL Take 1 tablet (300 mg total) by mouth daily with breakfast.   CALCIUM 1200 PO Take  1,250 mg by mouth daily.   CENTRUM SILVER tablet Take 1 tablet by mouth daily.   clopidogrel 75 MG tablet Commonly known as:  PLAVIX Take 1 tablet (75 mg total) by mouth daily.   cyclobenzaprine 5 MG tablet Commonly known as:  FLEXERIL Take 5 mg by mouth 2 (two) times daily as needed for muscle spasms. Phillips   doxycycline 100 MG tablet Commonly known as:  VIBRA-TABS doxycycline hyclate 100 mg tablet   ESTRACE VAGINAL 0.1 MG/GM vaginal cream Generic drug:  estradiol Place 1 Applicatorful vaginally daily. Small amount each dose per pt   ezetimibe 10 MG tablet Commonly known as:  ZETIA TAKE 1 TABLET BY MOUTH DAILY.   Fish Oil 1200 MG Caps Take 1,200 mg by mouth at bedtime.   hyoscyamine 0.125 MG Tbdp disintergrating tablet Commonly known as:  ANASPAZ Place 1 tablet  (0.125 mg total) under the tongue 2 (two) times daily as needed.   lidocaine 5 % Commonly known as:  LIDODERM Place 1 patch onto the skin as needed (pain). Remove & Discard patch within 12 hours or as directed by MD   loratadine 10 MG tablet Commonly known as:  CLARITIN Take 1 tablet (10 mg total) by mouth daily.   LORazepam 1 MG tablet Commonly known as:  ATIVAN Take 1 tablet (1 mg total) by mouth 2 (two) times daily. May take extra 3rd dose if needed   magnesium gluconate 500 MG tablet Commonly known as:  MAGONATE Take 500 mg by mouth daily as needed (constipation).   meclizine 25 MG tablet Commonly known as:  ANTIVERT Take 1 tablet (25 mg total) by mouth 3 (three) times daily as needed for dizziness. 3 month supply   metoprolol succinate 50 MG 24 hr tablet Commonly known as:  TOPROL-XL Take 1 tablet (50 mg total) by mouth daily with breakfast.   morphine 15 MG tablet Commonly known as:  MSIR Take 15 mg by mouth every 4 (four) hours as needed for severe pain.   morphine 30 MG 12 hr tablet Commonly known as:  MS CONTIN Take 30 mg by mouth every 12 (twelve) hours.   Nebulizer Devi Use as directed   OXYGEN Inhale 2.5 L/hr into the lungs continuous.   pantoprazole 40 MG tablet Commonly known as:  PROTONIX TAKE 1 TABLET (40 MG TOTAL) BY MOUTH DAILY.   pravastatin 40 MG tablet Commonly known as:  PRAVACHOL Take 1 tablet (40 mg total) by mouth at bedtime.   promethazine 25 MG tablet Commonly known as:  PHENERGAN Take 25 mg by mouth every 8 (eight) hours as needed for nausea or vomiting.   SYSTANE 0.4-0.3 % Soln Generic drug:  Polyethyl Glycol-Propyl Glycol Apply 1 drop to eye daily as needed (dry eyes).   TART CHERRY ADVANCED PO Take 5 mLs by mouth daily. Juice Concentrate- 1tsp daily   ZOVIRAX 5 % Generic drug:  acyclovir ointment Apply 1 application topically as needed (flair).       Allergies:  Allergies  Allergen Reactions  . Tizanidine Other (See  Comments)    panic  . Other Hives and Rash    boiron acteane  . Qvar [Beclomethasone]   . Spiriva [Tiotropium Bromide Monohydrate]     rash  . Tiotropium Rash    rash  . Advair Diskus [Fluticasone-Salmeterol]   . Chlorzoxazone   . Lamotrigine   . Levofloxacin   . Neurontin [Gabapentin]     arthralgia  . Protriptyline Hcl     Severe constipation  .  Ropinirole Hcl     arthralgia  . Tamiflu [Oseltamivir Phosphate] Other (See Comments)    "like I was having a stroke"  . Verapamil   . Zonegran     Mood swings  . Advair Diskus [Fluticasone-Salmeterol] Rash  . Baclofen Rash  . Chlorzoxazone Rash  . Epinephrine Palpitations    Raises heart rate Raises heart rate  . Levofloxacin Rash  . Protriptyline Rash  . Ropinirole Rash    Past Medical History, Surgical history, Social history, and Family History were reviewed and updated.  Review of Systems: Review of Systems  Constitutional: Negative.   HENT: Negative.   Eyes: Negative.   Respiratory: Negative.   Cardiovascular: Negative.   Gastrointestinal: Negative.   Genitourinary: Negative.   Musculoskeletal: Positive for joint pain and myalgias.  Skin: Negative.   Endo/Heme/Allergies: Negative.   Psychiatric/Behavioral: Negative.       Physical Exam:  weight is 208 lb (94.3 kg). Her oral temperature is 98.5 F (36.9 C). Her blood pressure is 129/81 and her pulse is 79. Her respiration is 18 and oxygen saturation is 97%.   Wt Readings from Last 3 Encounters:  01/20/19 208 lb (94.3 kg)  11/26/18 207 lb 8 oz (94.1 kg)  10/28/18 207 lb 4 oz (94 kg)    Well-developed and well-nourished white female in no obvious distress.  Head neck exam shows no ocular or oral lesions.  She has no palpable cervical or supra clavicular lymph nodes.  Lungs are clear bilaterally.  Cardiac exam regular rate and rhythm with no murmurs, rubs or bruits.  Abdomen is soft.  She has good bowel sounds.  There is no fluid wave.  There is no guarding  or rebound tenderness.  She has no palpable liver or spleen tip.  Back exam shows no tenderness over the spine, ribs or hips.  Extremities shows no clubbing, cyanosis or edema.  She has good range of motion of her joints.  She has decent strength in upper and lower extremities.  Breast exam shows no breast masses bilaterally.  She has no erythema or warmth of the breast tissue.  There is no nipple discharge.  There is no bilateral axillary adenopathy.  Neurological exam shows no obvious neurological deficits.  She does have some chronic speech impairment but this is stable.     Lab Results  Component Value Date   WBC 7.2 01/20/2019   HGB 10.6 (L) 01/20/2019   HCT 36.8 01/20/2019   MCV 70.9 (L) 01/20/2019   PLT 319 01/20/2019   Lab Results  Component Value Date   FERRITIN <4 (L) 11/26/2018   IRON 38 (L) 11/26/2018   TIBC 463 (H) 11/26/2018   UIBC 425 (H) 11/26/2018   IRONPCTSAT 8 (L) 11/26/2018   Lab Results  Component Value Date   RETICCTPCT 1.1 04/27/2015   RBC 5.19 (H) 01/20/2019   RETICCTABS 55.1 04/27/2015   No results found for: KPAFRELGTCHN, LAMBDASER, KAPLAMBRATIO No results found for: IGGSERUM, IGA, IGMSERUM No results found for: Kathrynn Ducking, MSPIKE, SPEI   Chemistry      Component Value Date/Time   NA 140 01/20/2019 1407   NA 147 (H) 10/08/2017 1410   NA 141 02/17/2017 1132   K 4.3 01/20/2019 1407   K 4.6 10/08/2017 1410   K 5.3 (H) 02/17/2017 1132   CL 107 01/20/2019 1407   CL 106 10/08/2017 1410   CO2 26 01/20/2019 1407   CO2 30 10/08/2017 1410   CO2  29 02/17/2017 1132   BUN 15 01/20/2019 1407   BUN 15 10/08/2017 1410   BUN 17.8 02/17/2017 1132   CREATININE 0.99 01/20/2019 1407   CREATININE 1.0 10/08/2017 1410   CREATININE 0.8 02/17/2017 1132      Component Value Date/Time   CALCIUM 9.2 01/20/2019 1407   CALCIUM 9.0 10/08/2017 1410   CALCIUM 9.3 02/17/2017 1132   ALKPHOS 60 01/20/2019 1407   ALKPHOS 59  10/08/2017 1410   ALKPHOS 79 02/17/2017 1132   AST 22 01/20/2019 1407   AST 22 02/17/2017 1132   ALT 18 01/20/2019 1407   ALT 26 10/08/2017 1410   ALT 20 02/17/2017 1132   BILITOT 0.4 01/20/2019 1407   BILITOT 0.39 02/17/2017 1132       Impression and Plan: Ms. Guymon is a very pleasant 68 yo caucasian female with secondary polycythemia (JAK2 negative) due to COPD.   She is still on chronic oxygen therapy.  She does not need to be phlebotomized.  We are quite aggressive with respect to her phlebotomies.  We will go ahead and plan to get her back in 4 weeks.  We might have to phlebotomize her when we see her back.  We are very aggressive with her phlebotomies as she does tend to get neurological issues if her hematocrit gets above 38%.  [name], MD 2/19/20202:53 PM

## 2019-01-21 LAB — IRON AND TIBC
Iron: 31 ug/dL — ABNORMAL LOW (ref 41–142)
SATURATION RATIOS: 6 % — AB (ref 21–57)
TIBC: 520 ug/dL — AB (ref 236–444)
UIBC: 489 ug/dL — ABNORMAL HIGH (ref 120–384)

## 2019-01-21 LAB — FERRITIN: Ferritin: <4 ng/mL — ABNORMAL LOW (ref 11–307)

## 2019-01-22 ENCOUNTER — Ambulatory Visit
Admission: RE | Admit: 2019-01-22 | Discharge: 2019-01-22 | Disposition: A | Discharge status: Home / Self Care | Payer: Medicare HMO | Source: Ambulatory Visit | Attending: Hematology & Oncology | Admitting: Hematology & Oncology

## 2019-01-22 ENCOUNTER — Other Ambulatory Visit: Payer: Self-pay | Admitting: Hematology & Oncology

## 2019-01-22 DIAGNOSIS — R51 Headache: Secondary | ICD-10-CM | POA: Diagnosis not present

## 2019-01-22 DIAGNOSIS — G894 Chronic pain syndrome: Secondary | ICD-10-CM | POA: Diagnosis not present

## 2019-01-22 DIAGNOSIS — M4726 Other spondylosis with radiculopathy, lumbar region: Secondary | ICD-10-CM | POA: Diagnosis not present

## 2019-01-22 DIAGNOSIS — Z79891 Long term (current) use of opiate analgesic: Secondary | ICD-10-CM | POA: Diagnosis not present

## 2019-01-22 DIAGNOSIS — R928 Other abnormal and inconclusive findings on diagnostic imaging of breast: Secondary | ICD-10-CM | POA: Diagnosis not present

## 2019-01-22 DIAGNOSIS — N63 Unspecified lump in unspecified breast: Secondary | ICD-10-CM

## 2019-01-22 DIAGNOSIS — M542 Cervicalgia: Secondary | ICD-10-CM | POA: Diagnosis not present

## 2019-01-22 DIAGNOSIS — M791 Myalgia, unspecified site: Secondary | ICD-10-CM | POA: Diagnosis not present

## 2019-01-22 DIAGNOSIS — M62838 Other muscle spasm: Secondary | ICD-10-CM | POA: Diagnosis not present

## 2019-01-23 ENCOUNTER — Encounter: Payer: Self-pay | Admitting: Hematology & Oncology

## 2019-01-24 DIAGNOSIS — D45 Polycythemia vera: Secondary | ICD-10-CM | POA: Diagnosis not present

## 2019-01-24 DIAGNOSIS — J441 Chronic obstructive pulmonary disease with (acute) exacerbation: Secondary | ICD-10-CM | POA: Diagnosis not present

## 2019-01-24 DIAGNOSIS — Z1211 Encounter for screening for malignant neoplasm of colon: Secondary | ICD-10-CM | POA: Diagnosis not present

## 2019-01-24 DIAGNOSIS — Z1212 Encounter for screening for malignant neoplasm of rectum: Secondary | ICD-10-CM | POA: Diagnosis not present

## 2019-01-24 DIAGNOSIS — E785 Hyperlipidemia, unspecified: Secondary | ICD-10-CM | POA: Diagnosis not present

## 2019-01-24 DIAGNOSIS — R092 Respiratory arrest: Secondary | ICD-10-CM | POA: Diagnosis not present

## 2019-01-24 DIAGNOSIS — I639 Cerebral infarction, unspecified: Secondary | ICD-10-CM | POA: Diagnosis not present

## 2019-02-01 ENCOUNTER — Telehealth: Payer: Self-pay | Admitting: Internal Medicine

## 2019-02-01 MED ORDER — DOXYCYCLINE HYCLATE 100 MG PO TABS: 100.0000 mg | ORAL_TABLET | Freq: Two times a day (BID) | ORAL | 0 refills | Status: DC

## 2019-02-01 NOTE — Telephone Encounter (Signed)
I spoke with the patients husbands he verblizled understanding order sent nothing further is needed at this time.

## 2019-02-01 NOTE — Telephone Encounter (Signed)
Patient states husband says his wife is now sick coughing up yellow/ green mucus fever of 100. She is also wheezing and would like something to be sent into the pharmacy for her to the [facility] off [address].   Dr.Young please advise.

## 2019-02-01 NOTE — Telephone Encounter (Signed)
Offer doxycycline 100 mg, # 14, 1 twice daily 

## 2019-02-16 DIAGNOSIS — I10 Essential (primary) hypertension: Secondary | ICD-10-CM | POA: Diagnosis not present

## 2019-02-16 DIAGNOSIS — E785 Hyperlipidemia, unspecified: Secondary | ICD-10-CM | POA: Diagnosis not present

## 2019-02-19 ENCOUNTER — Telehealth: Payer: Self-pay | Admitting: Hematology & Oncology

## 2019-02-19 NOTE — Telephone Encounter (Signed)
LMVM for patient regarding r/s her 3/23 appt per 3/16 [facility] GUIDELINES/ PER 3/20 STAFF MESSAGE/ LETTER/CALENDAR MAILED

## 2019-02-22 ENCOUNTER — Inpatient Hospital Stay: Payer: Medicare HMO

## 2019-02-22 ENCOUNTER — Inpatient Hospital Stay: Payer: Medicare HMO | Admitting: Hematology & Oncology

## 2019-02-22 ENCOUNTER — Other Ambulatory Visit

## 2019-02-22 DIAGNOSIS — D45 Polycythemia vera: Secondary | ICD-10-CM | POA: Diagnosis not present

## 2019-02-22 DIAGNOSIS — E785 Hyperlipidemia, unspecified: Secondary | ICD-10-CM | POA: Diagnosis not present

## 2019-02-22 DIAGNOSIS — R092 Respiratory arrest: Secondary | ICD-10-CM | POA: Diagnosis not present

## 2019-02-22 DIAGNOSIS — I639 Cerebral infarction, unspecified: Secondary | ICD-10-CM | POA: Diagnosis not present

## 2019-03-04 ENCOUNTER — Telehealth: Payer: Self-pay | Admitting: Internal Medicine

## 2019-03-04 MED ORDER — FLUTICASONE PROPIONATE 50 MCG/ACT NA SUSP: 2.0000 | Freq: Every day | NASAL | 3 refills | Status: DC

## 2019-03-04 MED ORDER — FEXOFENADINE HCL 180 MG PO TABS: 180.0000 mg | ORAL_TABLET | Freq: Every day | ORAL | 1 refills | Status: DC

## 2019-03-04 NOTE — Telephone Encounter (Signed)
Patient states for two weeks now she has had SOB and itching on her eyes, Coughing up mucus and she has had to up oxygen on 3l at this time. She is having chest congestion and wheezing. No Fever.  She has taken eye drops and Claritin.   Cy please advise

## 2019-03-04 NOTE — Telephone Encounter (Signed)
Suggest changing to Allegra 180 mg ("24 hour) instead of Claritin Add Flonase (fluticasone) nasal spray    2 puffs each nostril once daily at bedtime  Let us know if that isn't helping after a few days

## 2019-03-04 NOTE — Telephone Encounter (Signed)
Called and spoke with pt stating to her that CY said for her to go back to Allegra instead of Claritin and that we would add Flonase nasal spray at bedtime to see if that would help. Also stated to her to call next week if med changes have not been helping. Pt expressed understanding. Verified pt's preferred pharmacy and sent meds in for pt at her request. Nothing further needed.

## 2019-03-15 ENCOUNTER — Inpatient Hospital Stay: Payer: Medicare HMO | Attending: Hematology & Oncology

## 2019-03-15 ENCOUNTER — Inpatient Hospital Stay (HOSPITAL_BASED_OUTPATIENT_CLINIC_OR_DEPARTMENT_OTHER): Payer: Medicare HMO | Admitting: Family

## 2019-03-15 ENCOUNTER — Other Ambulatory Visit: Payer: Self-pay

## 2019-03-15 ENCOUNTER — Telehealth: Payer: Self-pay | Admitting: Family

## 2019-03-15 ENCOUNTER — Encounter: Payer: Self-pay | Admitting: Family

## 2019-03-15 ENCOUNTER — Inpatient Hospital Stay: Payer: Medicare HMO

## 2019-03-15 VITALS — BP 106/67 | HR 87 | Temp 98.7°F | Resp 19 | Wt 209.0 lb

## 2019-03-15 DIAGNOSIS — D751 Secondary polycythemia: Secondary | ICD-10-CM

## 2019-03-15 DIAGNOSIS — J449 Chronic obstructive pulmonary disease, unspecified: Secondary | ICD-10-CM | POA: Insufficient documentation

## 2019-03-15 DIAGNOSIS — N63 Unspecified lump in unspecified breast: Secondary | ICD-10-CM

## 2019-03-15 DIAGNOSIS — D500 Iron deficiency anemia secondary to blood loss (chronic): Secondary | ICD-10-CM

## 2019-03-15 DIAGNOSIS — Z7902 Long term (current) use of antithrombotics/antiplatelets: Secondary | ICD-10-CM | POA: Diagnosis not present

## 2019-03-15 DIAGNOSIS — Z862 Personal history of diseases of the blood and blood-forming organs and certain disorders involving the immune mechanism: Secondary | ICD-10-CM | POA: Diagnosis not present

## 2019-03-15 DIAGNOSIS — D45 Polycythemia vera: Secondary | ICD-10-CM

## 2019-03-15 LAB — CMP (CANCER CENTER ONLY)
ALT: 18 U/L (ref 0–44)
AST: 22 U/L (ref 15–41)
Albumin: 4.1 g/dL (ref 3.5–5.0)
Alkaline Phosphatase: 64 U/L (ref 38–126)
Anion gap: 7 (ref 5–15)
BUN: 15 mg/dL (ref 8–23)
CO2: 27 mmol/L (ref 22–32)
Calcium: 8.9 mg/dL (ref 8.9–10.3)
Chloride: 106 mmol/L (ref 98–111)
Creatinine: 0.79 mg/dL (ref 0.44–1.00)
GFR, Est AFR Am: >60 mL/min (ref >60)
GFR, Estimated: >60 mL/min (ref >60)
Glucose, Bld: 105 mg/dL — ABNORMAL HIGH (ref 70–99)
Potassium: 4.8 mmol/L (ref 3.5–5.1)
Sodium: 140 mmol/L (ref 135–145)
Total Bilirubin: 0.4 mg/dL (ref 0.3–1.2)
Total Protein: 6.5 g/dL (ref 6.5–8.1)

## 2019-03-15 LAB — CBC WITH DIFFERENTIAL (CANCER CENTER ONLY)
Abs Immature Granulocytes: 0.02 10*3/uL (ref 0.00–0.07)
Basophils Absolute: 0 10*3/uL (ref 0.0–0.1)
Basophils Relative: 1 %
Eosinophils Absolute: 0.1 10*3/uL (ref 0.0–0.5)
Eosinophils Relative: 2 %
HCT: 36.9 % (ref 36.0–46.0)
Hemoglobin: 11 g/dL — ABNORMAL LOW (ref 12.0–15.0)
Immature Granulocytes: 0 %
Lymphocytes Relative: 24 %
Lymphs Abs: 1.3 10*3/uL (ref 0.7–4.0)
MCH: 21.6 pg — ABNORMAL LOW (ref 26.0–34.0)
MCHC: 29.8 g/dL — ABNORMAL LOW (ref 30.0–36.0)
MCV: 72.5 fL — ABNORMAL LOW (ref 80.0–100.0)
Monocytes Absolute: 0.4 10*3/uL (ref 0.1–1.0)
Monocytes Relative: 9 %
Neutro Abs: 3.3 10*3/uL (ref 1.7–7.7)
Neutrophils Relative %: 64 %
Platelet Count: 272 10*3/uL (ref 150–400)
RBC: 5.09 MIL/uL (ref 3.87–5.11)
RDW: 19.5 % — ABNORMAL HIGH (ref 11.5–15.5)
WBC Count: 5.1 10*3/uL (ref 4.0–10.5)
nRBC: 0 % (ref 0.0–0.2)

## 2019-03-15 NOTE — Progress Notes (Signed)
Hematology and Oncology Follow Up Visit  Natalie Hendrix [id number] 1951/02/24 68 y.o. 03/15/2019   Principle Diagnosis:  Secondary polycythemia vera- JAK2 negative  Current Therapy:   Phlebotomy to maintain hematocrit less than 38% Plavix 75 mg by mouth daily   Interim History:  Natalie Hendrix is here today for follow-up. She is doing fairly well but has some fatigue. She is still noting sweating from her head. She denies night sweats or hot flashes. She plans to speak with Dr. Nicholaus Bloom about it later this week.  Hct today is stable at 36.9%.  Iron saturation in February was 6% and ferritin < 4.  She states that she has a follow-up with Dr. Ellyn Hack coming up soon and she will be having an ECHO.  No episodes of bleeding, no bruising or petechiae.  No fever, chills, n/v, cough, rash, chest pain, palpitations, abdominal pain or changes in bowel or bladder habits.  The numbness and tingling in her  No lymphadenopathy noted on exam.  She has maintained a good appetite and she has noted some increased GERD at times. She is staying well hydrated. Her weight is stable.   ECOG Performance Status: 1 - Symptomatic but completely ambulatory  Medications:  Allergies as of 03/15/2019      Reactions   Tizanidine Other (See Comments)   panic   Other Hives, Rash   boiron acteane   Qvar [beclomethasone]    Spiriva [tiotropium Bromide Monohydrate]    rash   Tiotropium Rash   rash   Advair Diskus [fluticasone-salmeterol]    Chlorzoxazone    Lamotrigine    Levofloxacin    Neurontin [gabapentin]    arthralgia   Protriptyline Hcl    Severe constipation   Ropinirole Hcl    arthralgia   Tamiflu [oseltamivir Phosphate] Other (See Comments)   "like I was having a stroke"   Verapamil    Zonegran    Mood swings   Advair Diskus [fluticasone-salmeterol] Rash   Baclofen Rash   Chlorzoxazone Rash   Epinephrine Palpitations   Raises heart rate Raises heart rate   Levofloxacin Rash   Protriptyline Rash   Ropinirole Rash      Medication List       Accurate as of March 15, 2019  1:53 PM. Always use your most recent med list.        albuterol 108 (90 Base) MCG/ACT inhaler Commonly known as:  ProAir HFA Inhale 2 puffs into the lungs every 6 (six) hours as needed for wheezing or shortness of breath.   albuterol (2.5 MG/3ML) 0.083% nebulizer solution Commonly known as:  PROVENTIL Take 3 mLs (2.5 mg total) by nebulization every 6 (six) hours as needed for wheezing or shortness of breath.   amoxicillin-clavulanate 875-125 MG tablet Commonly known as:  AUGMENTIN Take 1 tablet by mouth 2 (two) times daily.   azelastine 0.1 % nasal spray Commonly known as:  ASTELIN Place 1 spray into both nostrils 2 (two) times daily.   beclomethasone 42 MCG/SPRAY nasal spray Commonly known as:  BECONASE-AQ Place 2 sprays into both nostrils 2 (two) times daily. Dose is for each nostril.   benzonatate 100 MG capsule Commonly known as:  TESSALON Take 1 capsule (100 mg total) by mouth every 6 (six) hours as needed for cough.   buPROPion 300 MG 24 hr tablet Commonly known as:  WELLBUTRIN XL Take 1 tablet (300 mg total) by mouth daily with breakfast.   CALCIUM 1200 PO Take 1,250 mg by mouth  daily.   Centrum Silver tablet Take 1 tablet by mouth daily.   clopidogrel 75 MG tablet Commonly known as:  PLAVIX Take 1 tablet (75 mg total) by mouth daily.   cyclobenzaprine 5 MG tablet Commonly known as:  FLEXERIL Take 5 mg by mouth 2 (two) times daily as needed for muscle spasms. Phillips   doxycycline 100 MG tablet Commonly known as:  VIBRA-TABS doxycycline hyclate 100 mg tablet   doxycycline 100 MG tablet Commonly known as:  VIBRA-TABS Take 1 tablet (100 mg total) by mouth 2 (two) times daily.   ESTRACE VAGINAL 0.1 MG/GM vaginal cream Generic drug:  estradiol Place 1 Applicatorful vaginally daily. Small amount each dose per pt   ezetimibe 10 MG tablet Commonly known as:   Zetia TAKE 1 TABLET BY MOUTH DAILY.   fexofenadine 180 MG tablet Commonly known as:  Allegra Allergy Take 1 tablet (180 mg total) by mouth daily.   Fish Oil 1200 MG Caps Take 1,200 mg by mouth at bedtime.   fluticasone 50 MCG/ACT nasal spray Commonly known as:  FLONASE Place 2 sprays into both nostrils at bedtime.   hyoscyamine 0.125 MG Tbdp disintergrating tablet Commonly known as:  ANASPAZ Place 1 tablet (0.125 mg total) under the tongue 2 (two) times daily as needed.   lidocaine 5 % Commonly known as:  LIDODERM Place 1 patch onto the skin as needed (pain). Remove & Discard patch within 12 hours or as directed by MD   LORazepam 1 MG tablet Commonly known as:  ATIVAN Take 1 tablet (1 mg total) by mouth 2 (two) times daily. May take extra 3rd dose if needed   magnesium gluconate 500 MG tablet Commonly known as:  MAGONATE Take 500 mg by mouth daily as needed (constipation).   meclizine 25 MG tablet Commonly known as:  ANTIVERT Take 1 tablet (25 mg total) by mouth 3 (three) times daily as needed for dizziness. 3 month supply   metoprolol succinate 50 MG 24 hr tablet Commonly known as:  TOPROL-XL Take 1 tablet (50 mg total) by mouth daily with breakfast.   morphine 15 MG tablet Commonly known as:  MSIR Take 15 mg by mouth every 4 (four) hours as needed for severe pain.   morphine 30 MG 12 hr tablet Commonly known as:  MS CONTIN Take 30 mg by mouth every 12 (twelve) hours.   Nebulizer Devi Use as directed   OXYGEN Inhale 2.5 L/hr into the lungs continuous.   pantoprazole 40 MG tablet Commonly known as:  PROTONIX TAKE 1 TABLET (40 MG TOTAL) BY MOUTH DAILY.   pravastatin 40 MG tablet Commonly known as:  PRAVACHOL Take 1 tablet (40 mg total) by mouth at bedtime.   promethazine 25 MG tablet Commonly known as:  PHENERGAN Take 25 mg by mouth every 8 (eight) hours as needed for nausea or vomiting.   Systane 0.4-0.3 % Soln Generic drug:  Polyethyl Glycol-Propyl  Glycol Apply 1 drop to eye daily as needed (dry eyes).   TART CHERRY ADVANCED PO Take 5 mLs by mouth daily. Juice Concentrate- 1tsp daily   Zovirax 5 % Generic drug:  acyclovir ointment Apply 1 application topically as needed (flair).       Allergies:  Allergies  Allergen Reactions  . Tizanidine Other (See Comments)    panic  . Other Hives and Rash    boiron acteane  . Qvar [Beclomethasone]   . Spiriva [Tiotropium Bromide Monohydrate]     rash  . Tiotropium Rash  rash  . Advair Diskus [Fluticasone-Salmeterol]   . Chlorzoxazone   . Lamotrigine   . Levofloxacin   . Neurontin [Gabapentin]     arthralgia  . Protriptyline Hcl     Severe constipation  . Ropinirole Hcl     arthralgia  . Tamiflu [Oseltamivir Phosphate] Other (See Comments)    "like I was having a stroke"  . Verapamil   . Zonegran     Mood swings  . Advair Diskus [Fluticasone-Salmeterol] Rash  . Baclofen Rash  . Chlorzoxazone Rash  . Epinephrine Palpitations    Raises heart rate Raises heart rate  . Levofloxacin Rash  . Protriptyline Rash  . Ropinirole Rash    Past Medical History, Surgical history, Social history, and Family History were reviewed and updated.  Review of Systems: All other 10 point review of systems is negative.   Physical Exam:  vitals were not taken for this visit.   Wt Readings from Last 3 Encounters:  01/20/19 208 lb (94.3 kg)  11/26/18 207 lb 8 oz (94.1 kg)  10/28/18 207 lb 4 oz (94 kg)    Ocular: Sclerae unicteric, pupils equal, round and reactive to light Ear-nose-throat: Oropharynx clear, dentition fair Lymphatic: No cervical or supraclavicular adenopathy Lungs no rales or rhonchi, good excursion bilaterally Heart regular rate and rhythm, no murmur appreciated Abd soft, nontender, positive bowel sounds, no liver or spleen tip palpated on exam, no fluid wave  MSK no focal spinal tenderness, no joint edema Neuro: non-focal, well-oriented, appropriate affect  Breasts: Deferred   Lab Results  Component Value Date   WBC 5.1 03/15/2019   HGB 11.0 (L) 03/15/2019   HCT 36.9 03/15/2019   MCV 72.5 (L) 03/15/2019   PLT 272 03/15/2019   Lab Results  Component Value Date   FERRITIN <4 (L) 01/20/2019   IRON 31 (L) 01/20/2019   TIBC 520 (H) 01/20/2019   UIBC 489 (H) 01/20/2019   IRONPCTSAT 6 (L) 01/20/2019   Lab Results  Component Value Date   RETICCTPCT 1.1 04/27/2015   RBC 5.09 03/15/2019   RETICCTABS 55.1 04/27/2015   No results found for: KPAFRELGTCHN, LAMBDASER, KAPLAMBRATIO No results found for: IGGSERUM, IGA, IGMSERUM No results found for: Odetta Pink, SPEI   Chemistry      Component Value Date/Time   NA 140 01/20/2019 1407   NA 147 (H) 10/08/2017 1410   NA 141 02/17/2017 1132   K 4.3 01/20/2019 1407   K 4.6 10/08/2017 1410   K 5.3 (H) 02/17/2017 1132   CL 107 01/20/2019 1407   CL 106 10/08/2017 1410   CO2 26 01/20/2019 1407   CO2 30 10/08/2017 1410   CO2 29 02/17/2017 1132   BUN 15 01/20/2019 1407   BUN 15 10/08/2017 1410   BUN 17.8 02/17/2017 1132   CREATININE 0.99 01/20/2019 1407   CREATININE 1.0 10/08/2017 1410   CREATININE 0.8 02/17/2017 1132      Component Value Date/Time   CALCIUM 9.2 01/20/2019 1407   CALCIUM 9.0 10/08/2017 1410   CALCIUM 9.3 02/17/2017 1132   ALKPHOS 60 01/20/2019 1407   ALKPHOS 59 10/08/2017 1410   ALKPHOS 79 02/17/2017 1132   AST 22 01/20/2019 1407   AST 22 02/17/2017 1132   ALT 18 01/20/2019 1407   ALT 26 10/08/2017 1410   ALT 20 02/17/2017 1132   BILITOT 0.4 01/20/2019 1407   BILITOT 0.39 02/17/2017 1132       Impression and Plan: Natalie Hendrix is  a very pleasant 68 yo caucasian female with secondary polycythemia (JAK2 negative) due to COPD.  Hct today is 36.9% so no phlebotomy needed this visit.  We will see her back in another month for follow-up.  She will contact our office with any questions or concerns. We can certainly  see her sooner if need be.   [name], NP 4/13/20201:53 PM

## 2019-03-15 NOTE — Telephone Encounter (Signed)
Called and spoke with patient regarding appointments added to her schedule per 4/13 los

## 2019-03-16 LAB — IRON AND TIBC
Iron: 43 ug/dL (ref 41–142)
Saturation Ratios: 9 % — ABNORMAL LOW (ref 21–57)
TIBC: 469 ug/dL — ABNORMAL HIGH (ref 236–444)
UIBC: 426 ug/dL — ABNORMAL HIGH (ref 120–384)

## 2019-03-16 LAB — FERRITIN: Ferritin: <4 ng/mL — ABNORMAL LOW (ref 11–307)

## 2019-03-19 DIAGNOSIS — M25571 Pain in right ankle and joints of right foot: Secondary | ICD-10-CM | POA: Diagnosis not present

## 2019-03-19 DIAGNOSIS — I10 Essential (primary) hypertension: Secondary | ICD-10-CM | POA: Diagnosis not present

## 2019-03-19 DIAGNOSIS — R69 Illness, unspecified: Secondary | ICD-10-CM | POA: Diagnosis not present

## 2019-03-19 DIAGNOSIS — I639 Cerebral infarction, unspecified: Secondary | ICD-10-CM | POA: Diagnosis not present

## 2019-03-22 ENCOUNTER — Other Ambulatory Visit: Payer: Self-pay | Admitting: Family Medicine

## 2019-03-22 DIAGNOSIS — I639 Cerebral infarction, unspecified: Secondary | ICD-10-CM

## 2019-03-22 DIAGNOSIS — M25571 Pain in right ankle and joints of right foot: Secondary | ICD-10-CM

## 2019-03-23 ENCOUNTER — Encounter (HOSPITAL_COMMUNITY): Payer: Self-pay

## 2019-03-23 ENCOUNTER — Ambulatory Visit (HOSPITAL_COMMUNITY): Admission: RE | Admit: 2019-03-23 | Payer: Medicare HMO | Source: Ambulatory Visit

## 2019-03-23 DIAGNOSIS — G894 Chronic pain syndrome: Secondary | ICD-10-CM | POA: Diagnosis not present

## 2019-03-23 DIAGNOSIS — S335XXS Sprain of ligaments of lumbar spine, sequela: Secondary | ICD-10-CM | POA: Diagnosis not present

## 2019-03-23 DIAGNOSIS — S134XXS Sprain of ligaments of cervical spine, sequela: Secondary | ICD-10-CM | POA: Diagnosis not present

## 2019-03-23 DIAGNOSIS — M791 Myalgia, unspecified site: Secondary | ICD-10-CM | POA: Diagnosis not present

## 2019-03-25 ENCOUNTER — Ambulatory Visit
Admission: RE | Admit: 2019-03-25 | Discharge: 2019-03-25 | Disposition: A | Discharge status: Home / Self Care | Source: Ambulatory Visit | Attending: Family Medicine | Admitting: Family Medicine

## 2019-03-25 ENCOUNTER — Ambulatory Visit
Admission: RE | Admit: 2019-03-25 | Discharge: 2019-03-25 | Disposition: A | Discharge status: Home / Self Care | Payer: Medicare HMO | Source: Ambulatory Visit | Attending: Family Medicine | Admitting: Family Medicine

## 2019-03-25 ENCOUNTER — Other Ambulatory Visit: Payer: Self-pay | Admitting: Family Medicine

## 2019-03-25 ENCOUNTER — Other Ambulatory Visit: Payer: Self-pay

## 2019-03-25 DIAGNOSIS — M25571 Pain in right ankle and joints of right foot: Secondary | ICD-10-CM

## 2019-03-25 DIAGNOSIS — E785 Hyperlipidemia, unspecified: Secondary | ICD-10-CM | POA: Diagnosis not present

## 2019-03-25 DIAGNOSIS — M542 Cervicalgia: Secondary | ICD-10-CM | POA: Diagnosis not present

## 2019-03-25 DIAGNOSIS — I639 Cerebral infarction, unspecified: Secondary | ICD-10-CM | POA: Diagnosis not present

## 2019-03-25 DIAGNOSIS — S0990XA Unspecified injury of head, initial encounter: Secondary | ICD-10-CM

## 2019-03-25 DIAGNOSIS — D45 Polycythemia vera: Secondary | ICD-10-CM | POA: Diagnosis not present

## 2019-03-25 DIAGNOSIS — R0902 Hypoxemia: Secondary | ICD-10-CM | POA: Diagnosis not present

## 2019-04-10 ENCOUNTER — Encounter: Payer: Self-pay | Admitting: Hematology & Oncology

## 2019-04-13 ENCOUNTER — Telehealth: Payer: Self-pay | Admitting: *Deleted

## 2019-04-13 NOTE — Telephone Encounter (Signed)
LEFT MESSAGE FOR PATIENT.    Pt will f/u with [facility] provider as scheduled.  Pt. advised that we are restricting visitors at this time and request that only patients present for check-in prior to their appointment and  to wear face covering.  All other visitors should remain in their car.  If necessary, only one visitor may come with the patient, into the building. For everyone's safety, all patients and visitor entering our practice area should expect to be screened again prior to entering our waiting area. APPT 5/18

## 2019-04-14 ENCOUNTER — Inpatient Hospital Stay: Payer: Medicare HMO

## 2019-04-14 ENCOUNTER — Telehealth: Payer: Self-pay | Admitting: *Deleted

## 2019-04-14 ENCOUNTER — Inpatient Hospital Stay: Payer: Medicare HMO | Admitting: Family

## 2019-04-14 NOTE — Telephone Encounter (Signed)
Spoke to husband. Aware of office appointment schedule for 5/18.  Aware that patient will need a face covering and that husband will need to stay in car for visit - husband states the doctor amy call him to discuss anything due to patient having some memory issues.        COVID-19 Pre-Screening Questions:  . In the past 7 to 10 days have you had a cough,  shortness of breath, headache, congestion, fever, body aches, chills, sore throat, or sudden loss of taste or sense of smell? No  . Have you been around anyone with known Covid 19. No  . Have you been around anyone who is awaiting Covid 19 test results in the past 7 to 10 days? no . Have you been around anyone who has been exposed to Covid 19, or has mentioned symptoms of Covid 19 within the past 7 to 10 days? No   If you have any concerns about symptoms your patients report please contact your leadership team, or the provider the patient is seeing in the office for further guidance.

## 2019-04-19 ENCOUNTER — Ambulatory Visit: Admitting: Cardiology

## 2019-04-19 ENCOUNTER — Telehealth: Payer: Self-pay | Admitting: Cardiology

## 2019-04-19 NOTE — Telephone Encounter (Signed)
Returned call to patient's husband (DPR). Patient was supposed to have an appointment with MD today - new patient, establish care, self-referral. Patient's husband has bronchitis and wife (patient) may have this as well. Patient had a 99.8 temp this AM. They cancelled 04/19/2019 appointment today b/c of this.   Per spouse, patient is supposed to have echo prior to appointment (?)  Message routed to [facility] as Juluis Rainier

## 2019-04-19 NOTE — Telephone Encounter (Signed)
New Message:    Patient husband calling to cancel her appt. patient is not feeling good. Please call her husband. Patient states that nurse told her to call if she is not feeling good.

## 2019-04-20 DIAGNOSIS — M542 Cervicalgia: Secondary | ICD-10-CM | POA: Diagnosis not present

## 2019-04-20 DIAGNOSIS — S134XXS Sprain of ligaments of cervical spine, sequela: Secondary | ICD-10-CM | POA: Diagnosis not present

## 2019-04-20 DIAGNOSIS — M62838 Other muscle spasm: Secondary | ICD-10-CM | POA: Diagnosis not present

## 2019-04-20 DIAGNOSIS — M791 Myalgia, unspecified site: Secondary | ICD-10-CM | POA: Diagnosis not present

## 2019-04-20 DIAGNOSIS — G894 Chronic pain syndrome: Secondary | ICD-10-CM | POA: Diagnosis not present

## 2019-04-20 DIAGNOSIS — S335XXS Sprain of ligaments of lumbar spine, sequela: Secondary | ICD-10-CM | POA: Diagnosis not present

## 2019-04-21 ENCOUNTER — Encounter: Payer: Self-pay | Admitting: Family

## 2019-04-21 ENCOUNTER — Inpatient Hospital Stay (HOSPITAL_BASED_OUTPATIENT_CLINIC_OR_DEPARTMENT_OTHER): Payer: Medicare HMO | Admitting: Family

## 2019-04-21 ENCOUNTER — Other Ambulatory Visit: Payer: Self-pay

## 2019-04-21 ENCOUNTER — Inpatient Hospital Stay: Payer: Medicare HMO

## 2019-04-21 ENCOUNTER — Inpatient Hospital Stay: Payer: Medicare HMO | Attending: Hematology & Oncology

## 2019-04-21 VITALS — BP 120/78 | HR 81 | Ht 67.0 in | Wt 209.2 lb

## 2019-04-21 VITALS — BP 110/89 | HR 90

## 2019-04-21 DIAGNOSIS — J449 Chronic obstructive pulmonary disease, unspecified: Secondary | ICD-10-CM | POA: Diagnosis not present

## 2019-04-21 DIAGNOSIS — D45 Polycythemia vera: Secondary | ICD-10-CM

## 2019-04-21 DIAGNOSIS — D751 Secondary polycythemia: Secondary | ICD-10-CM | POA: Diagnosis not present

## 2019-04-21 DIAGNOSIS — G629 Polyneuropathy, unspecified: Secondary | ICD-10-CM | POA: Diagnosis not present

## 2019-04-21 DIAGNOSIS — D500 Iron deficiency anemia secondary to blood loss (chronic): Secondary | ICD-10-CM

## 2019-04-21 LAB — CMP (CANCER CENTER ONLY)
ALT: 17 U/L (ref 0–44)
AST: 22 U/L (ref 15–41)
Albumin: 4 g/dL (ref 3.5–5.0)
Alkaline Phosphatase: 63 U/L (ref 38–126)
Anion gap: 7 (ref 5–15)
BUN: 15 mg/dL (ref 8–23)
CO2: 26 mmol/L (ref 22–32)
Calcium: 8.7 mg/dL — ABNORMAL LOW (ref 8.9–10.3)
Chloride: 105 mmol/L (ref 98–111)
Creatinine: 0.76 mg/dL (ref 0.44–1.00)
GFR, Est AFR Am: >60 mL/min (ref >60)
GFR, Estimated: >60 mL/min (ref >60)
Glucose, Bld: 109 mg/dL — ABNORMAL HIGH (ref 70–99)
Potassium: 4.5 mmol/L (ref 3.5–5.1)
Sodium: 138 mmol/L (ref 135–145)
Total Bilirubin: 0.4 mg/dL (ref 0.3–1.2)
Total Protein: 6.2 g/dL — ABNORMAL LOW (ref 6.5–8.1)

## 2019-04-21 LAB — CBC WITH DIFFERENTIAL (CANCER CENTER ONLY)
Abs Immature Granulocytes: 0.01 10*3/uL (ref 0.00–0.07)
Basophils Absolute: 0 10*3/uL (ref 0.0–0.1)
Basophils Relative: 1 %
Eosinophils Absolute: 0.1 10*3/uL (ref 0.0–0.5)
Eosinophils Relative: 2 %
HCT: 38.6 % (ref 36.0–46.0)
Hemoglobin: 11.6 g/dL — ABNORMAL LOW (ref 12.0–15.0)
Immature Granulocytes: 0 %
Lymphocytes Relative: 25 %
Lymphs Abs: 1.2 10*3/uL (ref 0.7–4.0)
MCH: 22.4 pg — ABNORMAL LOW (ref 26.0–34.0)
MCHC: 30.1 g/dL (ref 30.0–36.0)
MCV: 74.5 fL — ABNORMAL LOW (ref 80.0–100.0)
Monocytes Absolute: 0.5 10*3/uL (ref 0.1–1.0)
Monocytes Relative: 11 %
Neutro Abs: 3 10*3/uL (ref 1.7–7.7)
Neutrophils Relative %: 61 %
Platelet Count: 259 10*3/uL (ref 150–400)
RBC: 5.18 MIL/uL — ABNORMAL HIGH (ref 3.87–5.11)
RDW: 19 % — ABNORMAL HIGH (ref 11.5–15.5)
WBC Count: 4.9 10*3/uL (ref 4.0–10.5)
nRBC: 0 % (ref 0.0–0.2)

## 2019-04-21 MED ORDER — SODIUM CHLORIDE 0.9 % IV SOLN
Freq: Once | INTRAVENOUS | Status: AC
  Administered 2019-04-21: 15:00:00 via INTRAVENOUS
  Filled 2019-04-21: qty 250

## 2019-04-21 NOTE — Patient Instructions (Signed)

## 2019-04-21 NOTE — Progress Notes (Signed)
Natalie Hendrix presents today for phlebotomy per MD orders. Phlebotomy procedure started at 1500 and ended at 1507. 250 cc removed via 18 G needle at L forearm. Patient tolerated procedure well. IV needle removed intact.

## 2019-04-21 NOTE — Progress Notes (Signed)
Hematology and Oncology Follow Up Visit  Natalie Hendrix [id number] [birth year] 68 y.o. 04/21/2019   Principle Diagnosis:  Secondary polycythemia vera- JAK2 negative  Current Therapy:   Phlebotomy to maintain hematocrit less than 38% Plavix 75 mg by mouth daily   Interim History:  [name] is here today for follow-up. She has noted increased headaches, dizziness, angina, hot flashes and fatigue. Hct is 38.6%.  No bleeding, bruising or petechiae.  She did have a fall in April when a step from her porch broke. CT was negative. Thankfully, she was not seriously injured.  No fever, chills, n/v, cough, rash, abdominal pain or changes in bowel or bladder habits.  No swelling in her extremities at this time. The numbness and tingling in her fingertips is unchanged.  No lymphadenopathy noted on exam.  Her appetite comes and goes. She is hydrating and her weight is stable at 209 lbs.   ECOG Performance Status: 1 - Symptomatic but completely ambulatory  Medications:  Allergies as of 04/21/2019      Reactions   Tizanidine Other (See Comments)   panic   Other Hives, Rash   boiron acteane   Qvar [beclomethasone]    Spiriva [tiotropium Bromide Monohydrate]    rash   Tiotropium Rash   rash   Advair Diskus [fluticasone-salmeterol]    Chlorzoxazone    Lamotrigine    Levofloxacin    Neurontin [gabapentin]    arthralgia   Protriptyline Hcl    Severe constipation   Ropinirole Hcl    arthralgia   Tamiflu [oseltamivir Phosphate] Other (See Comments)   "like I was having a stroke"   Verapamil    Zonegran    Mood swings   Advair Diskus [fluticasone-salmeterol] Rash   Baclofen Rash   Chlorzoxazone Rash   Epinephrine Palpitations   Raises heart rate Raises heart rate   Levofloxacin Rash   Protriptyline Rash   Ropinirole Rash      Medication List       Accurate as of Apr 21, 2019  3:01 PM. If you have any questions, ask your nurse or doctor.        albuterol 108 (90 Base)  MCG/ACT inhaler Commonly known as:  ProAir HFA Inhale 2 puffs into the lungs every 6 (six) hours as needed for wheezing or shortness of breath.   albuterol (2.5 MG/3ML) 0.083% nebulizer solution Commonly known as:  PROVENTIL Take 3 mLs (2.5 mg total) by nebulization every 6 (six) hours as needed for wheezing or shortness of breath.   azelastine 0.1 % nasal spray Commonly known as:  ASTELIN Place 1 spray into both nostrils 2 (two) times daily.   beclomethasone 42 MCG/SPRAY nasal spray Commonly known as:  BECONASE-AQ Place 2 sprays into both nostrils 2 (two) times daily. Dose is for each nostril.   benzonatate 100 MG capsule Commonly known as:  TESSALON Take 1 capsule (100 mg total) by mouth every 6 (six) hours as needed for cough.   buPROPion 300 MG 24 hr tablet Commonly known as:  WELLBUTRIN XL Take 1 tablet (300 mg total) by mouth daily with breakfast.   CALCIUM 1200 PO Take 1,250 mg by mouth daily.   Centrum Silver tablet Take 1 tablet by mouth daily.   clopidogrel 75 MG tablet Commonly known as:  PLAVIX Take 1 tablet (75 mg total) by mouth daily.   cyclobenzaprine 5 MG tablet Commonly known as:  FLEXERIL Take 5 mg by mouth 2 (two) times daily as needed for muscle spasms.  [place]ips   ESTRACE VAGINAL 0.1 MG/GM vaginal cream Generic drug:  estradiol Place 1 Applicatorful vaginally daily. Small amount each dose per pt   ezetimibe 10 MG tablet Commonly known as:  Zetia TAKE 1 TABLET BY MOUTH DAILY.   fexofenadine 180 MG tablet Commonly known as:  Allegra Allergy Take 1 tablet (180 mg total) by mouth daily.   Fish Oil 1200 MG Caps Take 1,200 mg by mouth at bedtime.   fluticasone 50 MCG/ACT nasal spray Commonly known as:  FLONASE Place 2 sprays into both nostrils at bedtime.   hyoscyamine 0.125 MG Tbdp disintergrating tablet Commonly known as:  ANASPAZ Place 1 tablet (0.125 mg total) under the tongue 2 (two) times daily as needed. What changed:  reasons to take  this   lidocaine 5 % Commonly known as:  LIDODERM Place 1 patch onto the skin as needed (pain). Remove & Discard patch within 12 hours or as directed by MD   LORazepam 1 MG tablet Commonly known as:  ATIVAN Take 1 tablet (1 mg total) by mouth 2 (two) times daily. May take extra 3rd dose if needed What changed:    when to take this  reasons to take this   magnesium gluconate 500 MG tablet Commonly known as:  MAGONATE Take 500 mg by mouth daily as needed (constipation).   meclizine 25 MG tablet Commonly known as:  ANTIVERT Take 1 tablet (25 mg total) by mouth 3 (three) times daily as needed for dizziness. 3 month supply What changed:  when to take this   metoprolol succinate 50 MG 24 hr tablet Commonly known as:  TOPROL-XL Take 1 tablet (50 mg total) by mouth daily with breakfast. What changed:  how much to take   morphine 15 MG tablet Commonly known as:  MSIR Take 15 mg by mouth every 4 (four) hours as needed for severe pain.   morphine 30 MG 12 hr tablet Commonly known as:  MS CONTIN Take 30 mg by mouth every 12 (twelve) hours.   Nebulizer Devi Use as directed   OXYGEN Inhale 2.5 L/hr into the lungs continuous.   pantoprazole 40 MG tablet Commonly known as:  PROTONIX TAKE 1 TABLET (40 MG TOTAL) BY MOUTH DAILY.   pravastatin 40 MG tablet Commonly known as:  PRAVACHOL Take 1 tablet (40 mg total) by mouth at bedtime.   promethazine 25 MG tablet Commonly known as:  PHENERGAN Take 25 mg by mouth every 8 (eight) hours as needed for nausea or vomiting.   Systane 0.4-0.3 % Soln Generic drug:  Polyethyl Glycol-Propyl Glycol Apply 1 drop to eye daily as needed (dry eyes).   TART CHERRY ADVANCED PO Take 5 mLs by mouth daily. Juice Concentrate- 1tsp daily   Zovirax 5 % Generic drug:  acyclovir ointment Apply 1 application topically as needed (flair).       Allergies:  Allergies  Allergen Reactions  . Tizanidine Other (See Comments)    panic  . Other Hives  and Rash    boiron acteane  . Qvar [Beclomethasone]   . Spiriva [Tiotropium Bromide Monohydrate]     rash  . Tiotropium Rash    rash  . Advair Diskus [Fluticasone-Salmeterol]   . Chlorzoxazone   . Lamotrigine   . Levofloxacin   . Neurontin [Gabapentin]     arthralgia  . Protriptyline Hcl     Severe constipation  . Ropinirole Hcl     arthralgia  . Tamiflu [Oseltamivir Phosphate] Other (See Comments)    "like  I was having a stroke"  . Verapamil   . Zonegran     Mood swings  . Advair Diskus [Fluticasone-Salmeterol] Rash  . Baclofen Rash  . Chlorzoxazone Rash  . Epinephrine Palpitations    Raises heart rate Raises heart rate  . Levofloxacin Rash  . Protriptyline Rash  . Ropinirole Rash    Past Medical History, Surgical history, Social history, and Family History were reviewed and updated.  Review of Systems: All other 10 point review of systems is negative.   Physical Exam:  height is 5\' 7"  (1.702 m) and weight is 209 lb 3.2 oz (94.9 kg). Her blood pressure is 120/78 and her pulse is 81.   Wt Readings from Last 3 Encounters:  04/21/19 209 lb 3.2 oz (94.9 kg)  03/15/19 209 lb (94.8 kg)  01/20/19 208 lb (94.3 kg)    Ocular: Sclerae unicteric, pupils equal, round and reactive to light Ear-nose-throat: Oropharynx clear, dentition fair Lymphatic: No cervical or supraclavicular adenopathy Lungs no rales or rhonchi, good excursion bilaterally Heart regular rate and rhythm, no murmur appreciated Abd soft, nontender, positive bowel sounds, no liver or spleen tip palpated on exam, no fluid wave  MSK no focal spinal tenderness, no joint edema Neuro: non-focal, well-oriented, appropriate affect Breasts: Deferred   Lab Results  Component Value Date   WBC 4.9 04/21/2019   HGB 11.6 (L) 04/21/2019   HCT 38.6 04/21/2019   MCV 74.5 (L) 04/21/2019   PLT 259 04/21/2019   Lab Results  Component Value Date   FERRITIN <4 (L) 03/15/2019   IRON 43 03/15/2019   TIBC 469 (H)  03/15/2019   UIBC 426 (H) 03/15/2019   IRONPCTSAT 9 (L) 03/15/2019   Lab Results  Component Value Date   RETICCTPCT 1.1 04/27/2015   RBC 5.18 (H) 04/21/2019   RETICCTABS 55.1 04/27/2015   No results found for: KPAFRELGTCHN, LAMBDASER, KAPLAMBRATIO No results found for: Kandis Cocking, IGMSERUM No results found for: Odetta Pink, SPEI   Chemistry      Component Value Date/Time   NA 138 04/21/2019 1346   NA 147 (H) 10/08/2017 1410   NA 141 02/17/2017 1132   K 4.5 04/21/2019 1346   K 4.6 10/08/2017 1410   K 5.3 (H) 02/17/2017 1132   CL 105 04/21/2019 1346   CL 106 10/08/2017 1410   CO2 26 04/21/2019 1346   CO2 30 10/08/2017 1410   CO2 29 02/17/2017 1132   BUN 15 04/21/2019 1346   BUN 15 10/08/2017 1410   BUN 17.8 02/17/2017 1132   CREATININE 0.76 04/21/2019 1346   CREATININE 1.0 10/08/2017 1410   CREATININE 0.8 02/17/2017 1132      Component Value Date/Time   CALCIUM 8.7 (L) 04/21/2019 1346   CALCIUM 9.0 10/08/2017 1410   CALCIUM 9.3 02/17/2017 1132   ALKPHOS 63 04/21/2019 1346   ALKPHOS 59 10/08/2017 1410   ALKPHOS 79 02/17/2017 1132   AST 22 04/21/2019 1346   AST 22 02/17/2017 1132   ALT 17 04/21/2019 1346   ALT 26 10/08/2017 1410   ALT 20 02/17/2017 1132   BILITOT 0.4 04/21/2019 1346   BILITOT 0.39 02/17/2017 1132       Impression and Plan: Ms. Knepp is a very pleasant 68 yo caucasian female with secondary polycythemia (JAK2 negative) due to COPD.  We will do a "mini" phlebotomy today for Hct of 38.6% followed by replacement fluids.  We will plan to see her back in another 6  weeks.  She will contact our office with any questions or concerns. We can certainly see her sooner if need be.   [name], NP 5/20/20203:01 PM

## 2019-04-22 LAB — IRON AND TIBC
Iron: 36 ug/dL — ABNORMAL LOW (ref 41–142)
Saturation Ratios: 8 % — ABNORMAL LOW (ref 21–57)
TIBC: 455 ug/dL — ABNORMAL HIGH (ref 236–444)
UIBC: 420 ug/dL — ABNORMAL HIGH (ref 120–384)

## 2019-04-22 LAB — FERRITIN: Ferritin: <4 ng/mL — ABNORMAL LOW (ref 11–307)

## 2019-04-24 DIAGNOSIS — E785 Hyperlipidemia, unspecified: Secondary | ICD-10-CM | POA: Diagnosis not present

## 2019-04-24 DIAGNOSIS — R0902 Hypoxemia: Secondary | ICD-10-CM | POA: Diagnosis not present

## 2019-04-24 DIAGNOSIS — I639 Cerebral infarction, unspecified: Secondary | ICD-10-CM | POA: Diagnosis not present

## 2019-04-24 DIAGNOSIS — D45 Polycythemia vera: Secondary | ICD-10-CM | POA: Diagnosis not present

## 2019-04-28 ENCOUNTER — Telehealth: Payer: Self-pay | Admitting: Family

## 2019-04-28 NOTE — Telephone Encounter (Signed)
Appointments scheduled calendar mailed

## 2019-04-30 DIAGNOSIS — I639 Cerebral infarction, unspecified: Secondary | ICD-10-CM | POA: Diagnosis not present

## 2019-04-30 DIAGNOSIS — R69 Illness, unspecified: Secondary | ICD-10-CM | POA: Diagnosis not present

## 2019-04-30 DIAGNOSIS — M25571 Pain in right ankle and joints of right foot: Secondary | ICD-10-CM | POA: Diagnosis not present

## 2019-05-03 ENCOUNTER — Ambulatory Visit: Payer: Medicare Other | Admitting: Internal Medicine

## 2019-05-20 DIAGNOSIS — S335XXS Sprain of ligaments of lumbar spine, sequela: Secondary | ICD-10-CM | POA: Diagnosis not present

## 2019-05-20 DIAGNOSIS — M791 Myalgia, unspecified site: Secondary | ICD-10-CM | POA: Diagnosis not present

## 2019-05-20 DIAGNOSIS — S134XXS Sprain of ligaments of cervical spine, sequela: Secondary | ICD-10-CM | POA: Diagnosis not present

## 2019-05-20 DIAGNOSIS — G894 Chronic pain syndrome: Secondary | ICD-10-CM | POA: Diagnosis not present

## 2019-05-25 DIAGNOSIS — E785 Hyperlipidemia, unspecified: Secondary | ICD-10-CM | POA: Diagnosis not present

## 2019-05-25 DIAGNOSIS — D45 Polycythemia vera: Secondary | ICD-10-CM | POA: Diagnosis not present

## 2019-05-25 DIAGNOSIS — I639 Cerebral infarction, unspecified: Secondary | ICD-10-CM | POA: Diagnosis not present

## 2019-05-25 DIAGNOSIS — R0902 Hypoxemia: Secondary | ICD-10-CM | POA: Diagnosis not present

## 2019-05-28 DIAGNOSIS — I639 Cerebral infarction, unspecified: Secondary | ICD-10-CM | POA: Diagnosis not present

## 2019-05-28 DIAGNOSIS — I10 Essential (primary) hypertension: Secondary | ICD-10-CM | POA: Diagnosis not present

## 2019-05-28 DIAGNOSIS — M25571 Pain in right ankle and joints of right foot: Secondary | ICD-10-CM | POA: Diagnosis not present

## 2019-05-28 DIAGNOSIS — R69 Illness, unspecified: Secondary | ICD-10-CM | POA: Diagnosis not present

## 2019-06-01 ENCOUNTER — Telehealth: Payer: Self-pay | Admitting: Internal Medicine

## 2019-06-01 NOTE — Telephone Encounter (Signed)
lmtcb x1 pt 

## 2019-06-02 ENCOUNTER — Other Ambulatory Visit: Payer: Self-pay | Admitting: Anesthesiology

## 2019-06-02 ENCOUNTER — Inpatient Hospital Stay (HOSPITAL_BASED_OUTPATIENT_CLINIC_OR_DEPARTMENT_OTHER): Payer: Medicare HMO | Admitting: Family

## 2019-06-02 ENCOUNTER — Encounter: Payer: Self-pay | Admitting: Family

## 2019-06-02 ENCOUNTER — Other Ambulatory Visit: Payer: Self-pay

## 2019-06-02 ENCOUNTER — Inpatient Hospital Stay: Payer: Medicare HMO | Attending: Hematology & Oncology

## 2019-06-02 ENCOUNTER — Inpatient Hospital Stay: Payer: Medicare HMO

## 2019-06-02 VITALS — BP 139/66 | HR 88 | Temp 97.9°F | Resp 20 | Ht 66.0 in | Wt 206.1 lb

## 2019-06-02 DIAGNOSIS — D500 Iron deficiency anemia secondary to blood loss (chronic): Secondary | ICD-10-CM

## 2019-06-02 DIAGNOSIS — R002 Palpitations: Secondary | ICD-10-CM

## 2019-06-02 DIAGNOSIS — Z881 Allergy status to other antibiotic agents status: Secondary | ICD-10-CM

## 2019-06-02 DIAGNOSIS — J449 Chronic obstructive pulmonary disease, unspecified: Secondary | ICD-10-CM

## 2019-06-02 DIAGNOSIS — R0602 Shortness of breath: Secondary | ICD-10-CM | POA: Diagnosis not present

## 2019-06-02 DIAGNOSIS — D751 Secondary polycythemia: Secondary | ICD-10-CM

## 2019-06-02 DIAGNOSIS — Z888 Allergy status to other drugs, medicaments and biological substances status: Secondary | ICD-10-CM | POA: Diagnosis not present

## 2019-06-02 DIAGNOSIS — Z882 Allergy status to sulfonamides status: Secondary | ICD-10-CM | POA: Insufficient documentation

## 2019-06-02 DIAGNOSIS — Z79899 Other long term (current) drug therapy: Secondary | ICD-10-CM | POA: Insufficient documentation

## 2019-06-02 DIAGNOSIS — D45 Polycythemia vera: Secondary | ICD-10-CM

## 2019-06-02 DIAGNOSIS — Z7902 Long term (current) use of antithrombotics/antiplatelets: Secondary | ICD-10-CM

## 2019-06-02 DIAGNOSIS — M2550 Pain in unspecified joint: Secondary | ICD-10-CM | POA: Insufficient documentation

## 2019-06-02 DIAGNOSIS — G629 Polyneuropathy, unspecified: Secondary | ICD-10-CM | POA: Insufficient documentation

## 2019-06-02 DIAGNOSIS — R5383 Other fatigue: Secondary | ICD-10-CM

## 2019-06-02 DIAGNOSIS — M546 Pain in thoracic spine: Secondary | ICD-10-CM

## 2019-06-02 LAB — CBC WITH DIFFERENTIAL (CANCER CENTER ONLY)
Abs Immature Granulocytes: 0.02 10*3/uL (ref 0.00–0.07)
Basophils Absolute: 0 10*3/uL (ref 0.0–0.1)
Basophils Relative: 1 %
Eosinophils Absolute: 0.2 10*3/uL (ref 0.0–0.5)
Eosinophils Relative: 3 %
HCT: 38.9 % (ref 36.0–46.0)
Hemoglobin: 11.6 g/dL — ABNORMAL LOW (ref 12.0–15.0)
Immature Granulocytes: 0 %
Lymphocytes Relative: 35 %
Lymphs Abs: 2 10*3/uL (ref 0.7–4.0)
MCH: 22.6 pg — ABNORMAL LOW (ref 26.0–34.0)
MCHC: 29.8 g/dL — ABNORMAL LOW (ref 30.0–36.0)
MCV: 75.7 fL — ABNORMAL LOW (ref 80.0–100.0)
Monocytes Absolute: 0.6 10*3/uL (ref 0.1–1.0)
Monocytes Relative: 9 %
Neutro Abs: 3 10*3/uL (ref 1.7–7.7)
Neutrophils Relative %: 52 %
Platelet Count: 266 10*3/uL (ref 150–400)
RBC: 5.14 MIL/uL — ABNORMAL HIGH (ref 3.87–5.11)
RDW: 16.5 % — ABNORMAL HIGH (ref 11.5–15.5)
WBC Count: 5.9 10*3/uL (ref 4.0–10.5)
nRBC: 0 % (ref 0.0–0.2)

## 2019-06-02 LAB — CMP (CANCER CENTER ONLY)
ALT: 16 U/L (ref 0–44)
AST: 20 U/L (ref 15–41)
Albumin: 4 g/dL (ref 3.5–5.0)
Alkaline Phosphatase: 68 U/L (ref 38–126)
Anion gap: 8 (ref 5–15)
BUN: 15 mg/dL (ref 8–23)
CO2: 28 mmol/L (ref 22–32)
Calcium: 9.3 mg/dL (ref 8.9–10.3)
Chloride: 106 mmol/L (ref 98–111)
Creatinine: 0.83 mg/dL (ref 0.44–1.00)
GFR, Est AFR Am: >60 mL/min (ref >60)
GFR, Estimated: >60 mL/min (ref >60)
Glucose, Bld: 97 mg/dL (ref 70–99)
Potassium: 4.5 mmol/L (ref 3.5–5.1)
Sodium: 142 mmol/L (ref 135–145)
Total Bilirubin: 0.3 mg/dL (ref 0.3–1.2)
Total Protein: 6.4 g/dL — ABNORMAL LOW (ref 6.5–8.1)

## 2019-06-02 MED ORDER — SODIUM CHLORIDE 0.9 % IV SOLN
Freq: Once | INTRAVENOUS | Status: AC
  Administered 2019-06-02: 12:00:00 via INTRAVENOUS
  Filled 2019-06-02: qty 250

## 2019-06-02 MED ORDER — FLUTICASONE PROPIONATE 50 MCG/ACT NA SUSP: 2.0000 | Freq: Every day | NASAL | 3 refills | Status: DC

## 2019-06-02 NOTE — Progress Notes (Signed)
Hematology and Oncology Follow Up Visit  Natalie Hendrix [id number] 01-18-1951 69 y.o. 06/02/2019   Principle Diagnosis:  Secondary polycythemia vera- JAK2 negative  Current Therapy:   Phlebotomy to maintain hematocrit less than 38% Plavix 75 mg by mouth daily   Interim History:  Natalie Hendrix is here today for follow-up. She is feeling fatigued and having increased joint pain. Her Hct is 38.9 and she states that she felt like she would need a phlebotomy this visit. She would like fluids after as well as this helps off set the volume loss.  Her SOB is stable and she is doing well on supplemental O2.  No fever, chills, n/v, cough, rash, abdominal pain or changes in bowel or bladder habits.  She states that she still has angina and palpitations at times. She has her routine follow-up with Dr. Ellyn Hack coming up soon.  She is also scheduled to have an MRI of the thoracic spine once the PA is completed.  She uses her walker when ambulating for added support. She has had a few falls but thankfully has not been injured. She is trying her best to be careful but does have issues with balance.  No swelling in her extremities at this time.  The neuropathy in her hands and feet remains stable.  No lymphadenopathy noted on exam.  She denies any episodes of bleeding. No bruising or petechiae.  Her appetite comes and goes. She admits that she needs to hydrate better at home. Her weight is stable.   ECOG Performance Status: 1 - Symptomatic but completely ambulatory  Medications:  Allergies as of 06/02/2019      Reactions   Tizanidine Other (See Comments)   panic   Other Hives, Rash   boiron acteane   Qvar [beclomethasone]    Spiriva [tiotropium Bromide Monohydrate]    rash   Tiotropium Rash   rash   Advair Diskus [fluticasone-salmeterol]    Chlorzoxazone    Lamotrigine    Levofloxacin    Neurontin [gabapentin]    arthralgia   Protriptyline Hcl    Severe constipation   Ropinirole Hcl    arthralgia   Tamiflu [oseltamivir Phosphate] Other (See Comments)   "like I was having a stroke"   Verapamil    Zonegran    Mood swings   Advair Diskus [fluticasone-salmeterol] Rash   Baclofen Rash   Chlorzoxazone Rash   Epinephrine Palpitations   Raises heart rate Raises heart rate   Levofloxacin Rash   Protriptyline Rash   Ropinirole Rash      Medication List       Accurate as of June 02, 2019 10:39 AM. If you have any questions, ask your nurse or doctor.        albuterol 108 (90 Base) MCG/ACT inhaler Commonly known as: ProAir HFA Inhale 2 puffs into the lungs every 6 (six) hours as needed for wheezing or shortness of breath.   albuterol (2.5 MG/3ML) 0.083% nebulizer solution Commonly known as: PROVENTIL Take 3 mLs (2.5 mg total) by nebulization every 6 (six) hours as needed for wheezing or shortness of breath.   azelastine 0.1 % nasal spray Commonly known as: ASTELIN Place 1 spray into both nostrils 2 (two) times daily.   beclomethasone 42 MCG/SPRAY nasal spray Commonly known as: BECONASE-AQ Place 2 sprays into both nostrils 2 (two) times daily. Dose is for each nostril.   benzonatate 100 MG capsule Commonly known as: TESSALON Take 1 capsule (100 mg total) by mouth every 6 (six) hours  as needed for cough.   buPROPion 300 MG 24 hr tablet Commonly known as: WELLBUTRIN XL Take 1 tablet (300 mg total) by mouth daily with breakfast.   CALCIUM 1200 PO Take 1,250 mg by mouth daily.   Centrum Silver tablet Take 1 tablet by mouth daily.   clopidogrel 75 MG tablet Commonly known as: PLAVIX Take 1 tablet (75 mg total) by mouth daily.   cyclobenzaprine 5 MG tablet Commonly known as: FLEXERIL Take 5 mg by mouth 2 (two) times daily as needed for muscle spasms. Phillips   ESTRACE VAGINAL 0.1 MG/GM vaginal cream Generic drug: estradiol Place 1 Applicatorful vaginally daily. Small amount each dose per pt   ezetimibe 10 MG tablet Commonly known as: Zetia TAKE 1  TABLET BY MOUTH DAILY.   fexofenadine 180 MG tablet Commonly known as: Allegra Allergy Take 1 tablet (180 mg total) by mouth daily.   Fish Oil 1200 MG Caps Take 1,200 mg by mouth at bedtime.   fluticasone 50 MCG/ACT nasal spray Commonly known as: FLONASE Place 2 sprays into both nostrils at bedtime.   hyoscyamine 0.125 MG Tbdp disintergrating tablet Commonly known as: ANASPAZ Place 1 tablet (0.125 mg total) under the tongue 2 (two) times daily as needed. What changed: reasons to take this   lidocaine 5 % Commonly known as: LIDODERM Place 1 patch onto the skin as needed (pain). Remove & Discard patch within 12 hours or as directed by MD   LORazepam 1 MG tablet Commonly known as: ATIVAN Take 1 tablet (1 mg total) by mouth 2 (two) times daily. May take extra 3rd dose if needed What changed:   when to take this  reasons to take this   magnesium gluconate 500 MG tablet Commonly known as: MAGONATE Take 500 mg by mouth daily as needed (constipation).   meclizine 25 MG tablet Commonly known as: ANTIVERT Take 1 tablet (25 mg total) by mouth 3 (three) times daily as needed for dizziness. 3 month supply What changed: when to take this   metoprolol succinate 50 MG 24 hr tablet Commonly known as: TOPROL-XL Take 1 tablet (50 mg total) by mouth daily with breakfast. What changed: how much to take   morphine 15 MG tablet Commonly known as: MSIR Take 15 mg by mouth every 4 (four) hours as needed for severe pain.   morphine 30 MG 12 hr tablet Commonly known as: MS CONTIN Take 30 mg by mouth every 12 (twelve) hours.   Nebulizer Devi Use as directed   OXYGEN Inhale 2.5 L/hr into the lungs continuous.   pantoprazole 40 MG tablet Commonly known as: PROTONIX TAKE 1 TABLET (40 MG TOTAL) BY MOUTH DAILY.   pravastatin 40 MG tablet Commonly known as: PRAVACHOL Take 1 tablet (40 mg total) by mouth at bedtime.   promethazine 25 MG tablet Commonly known as: PHENERGAN Take 25 mg  by mouth every 8 (eight) hours as needed for nausea or vomiting.   Systane 0.4-0.3 % Soln Generic drug: Polyethyl Glycol-Propyl Glycol Apply 1 drop to eye daily as needed (dry eyes).   TART CHERRY ADVANCED PO Take 5 mLs by mouth daily. Juice Concentrate- 1tsp daily   Zovirax 5 % Generic drug: acyclovir ointment Apply 1 application topically as needed (flair).       Allergies:  Allergies  Allergen Reactions  . Tizanidine Other (See Comments)    panic  . Other Hives and Rash    boiron acteane  . Qvar [Beclomethasone]   . Spiriva [Tiotropium Bromide  Monohydrate]     rash  . Tiotropium Rash    rash  . Advair Diskus [Fluticasone-Salmeterol]   . Chlorzoxazone   . Lamotrigine   . Levofloxacin   . Neurontin [Gabapentin]     arthralgia  . Protriptyline Hcl     Severe constipation  . Ropinirole Hcl     arthralgia  . Tamiflu [Oseltamivir Phosphate] Other (See Comments)    "like I was having a stroke"  . Verapamil   . Zonegran     Mood swings  . Advair Diskus [Fluticasone-Salmeterol] Rash  . Baclofen Rash  . Chlorzoxazone Rash  . Epinephrine Palpitations    Raises heart rate Raises heart rate  . Levofloxacin Rash  . Protriptyline Rash  . Ropinirole Rash    Past Medical History, Surgical history, Social history, and Family History were reviewed and updated.  Review of Systems: All other 10 point review of systems is negative.   Physical Exam:  vitals were not taken for this visit.   Wt Readings from Last 3 Encounters:  04/21/19 209 lb 3.2 oz (94.9 kg)  03/15/19 209 lb (94.8 kg)  01/20/19 208 lb (94.3 kg)    Ocular: Sclerae unicteric, pupils equal, round and reactive to light Ear-nose-throat: Oropharynx clear, dentition fair Lymphatic: No cervical or supraclavicular adenopathy Lungs no rales or rhonchi, good excursion bilaterally Heart regular rate and rhythm, no murmur appreciated Abd soft, nontender, positive bowel sounds, no liver or spleen tip palpated  on exam, no fluid wave  MSK no focal spinal tenderness, no joint edema Neuro: non-focal, well-oriented, appropriate affect Breasts: Deferred   Lab Results  Component Value Date   WBC 4.9 04/21/2019   HGB 11.6 (L) 04/21/2019   HCT 38.6 04/21/2019   MCV 74.5 (L) 04/21/2019   PLT 259 04/21/2019   Lab Results  Component Value Date   FERRITIN <4 (L) 04/21/2019   IRON 36 (L) 04/21/2019   TIBC 455 (H) 04/21/2019   UIBC 420 (H) 04/21/2019   IRONPCTSAT 8 (L) 04/21/2019   Lab Results  Component Value Date   RETICCTPCT 1.1 04/27/2015   RBC 5.18 (H) 04/21/2019   RETICCTABS 55.1 04/27/2015   No results found for: KPAFRELGTCHN, LAMBDASER, KAPLAMBRATIO No results found for: Kandis Cocking, IGMSERUM No results found for: Odetta Pink, SPEI   Chemistry      Component Value Date/Time   NA 138 04/21/2019 1346   NA 147 (H) 10/08/2017 1410   NA 141 02/17/2017 1132   K 4.5 04/21/2019 1346   K 4.6 10/08/2017 1410   K 5.3 (H) 02/17/2017 1132   CL 105 04/21/2019 1346   CL 106 10/08/2017 1410   CO2 26 04/21/2019 1346   CO2 30 10/08/2017 1410   CO2 29 02/17/2017 1132   BUN 15 04/21/2019 1346   BUN 15 10/08/2017 1410   BUN 17.8 02/17/2017 1132   CREATININE 0.76 04/21/2019 1346   CREATININE 1.0 10/08/2017 1410   CREATININE 0.8 02/17/2017 1132      Component Value Date/Time   CALCIUM 8.7 (L) 04/21/2019 1346   CALCIUM 9.0 10/08/2017 1410   CALCIUM 9.3 02/17/2017 1132   ALKPHOS 63 04/21/2019 1346   ALKPHOS 59 10/08/2017 1410   ALKPHOS 79 02/17/2017 1132   AST 22 04/21/2019 1346   AST 22 02/17/2017 1132   ALT 17 04/21/2019 1346   ALT 26 10/08/2017 1410   ALT 20 02/17/2017 1132   BILITOT 0.4 04/21/2019 1346   BILITOT 0.39 02/17/2017  1132       Impression and Plan: Natalie Hendrix is a very pleasant 68 yo caucasian female with secondary polycythemia (JAK2 negative) due to COPD.  She has felt symptomatic as mentioned above. We will  proceed with phlebotomy today followed by replacement fluids.  We will see her back in another month.  She will contact our office with any questions or concerns. We can certainly see her sooner if needed.   [name], NP 7/1/202010:39 AM

## 2019-06-02 NOTE — Telephone Encounter (Signed)
Called pt and spoke with husband Darrell letting him know that we sent refill of pt's fluticasone to pharmacy at her request. Darrell expressed understanding. Nothing further needed.

## 2019-06-02 NOTE — Patient Instructions (Signed)

## 2019-06-02 NOTE — Telephone Encounter (Signed)
Pt returned call from [place] and would like a call back

## 2019-06-03 LAB — IRON AND TIBC
Iron: 26 ug/dL — ABNORMAL LOW (ref 41–142)
Saturation Ratios: 5 % — ABNORMAL LOW (ref 21–57)
TIBC: 480 ug/dL — ABNORMAL HIGH (ref 236–444)
UIBC: 454 ug/dL — ABNORMAL HIGH (ref 120–384)

## 2019-06-03 LAB — FERRITIN: Ferritin: <4 ng/mL — ABNORMAL LOW (ref 11–307)

## 2019-06-09 NOTE — Telephone Encounter (Signed)
Dr Annamaria Boots,  This message was received today for you.    Hey Dr. Annamaria Boots, since I gave Natalie Hendrix my time when he was in last time, so sick, when should I schedule another appt? I am doing well. The Flonase helps, and I have a good toolbox of things to do if I hit a pocket of allergens. I usually see you more towards the fall when flu season and pollen, mold spores are higher. What will we do this year about regular flu shots and this covid thing. What's the plan? Just let me know when to schedule. You are probably super busy now. Maybe I should double up with Natalie Hendrix on his next visit. He is doing his cultures this week.  Just let me know.   Best Regards,   Natalie Hendrix   Allergies  Allergen Reactions  . Epinephrine Palpitations    Raises heart rate   . Protriptyline Hcl Other (See Comments)    Severe constipation  . Tamiflu [Oseltamivir Phosphate] Other (See Comments)    "like I was having a stroke"  . Tizanidine Other (See Comments)    panic  . Neurontin [Gabapentin] Other (See Comments)    arthralgia  . Other Hives and Rash    boiron acteane  . Qvar [Beclomethasone] Other (See Comments)  . Spiriva [Tiotropium Bromide Monohydrate] Rash  . Tiotropium Rash  . Zonegran Other (See Comments)    Mood swings  . Advair Diskus [Fluticasone-Salmeterol] Rash  . Baclofen Rash  . Chlorzoxazone Rash  . Lamotrigine Rash  . Levofloxacin Rash  . Protriptyline Rash  . Ropinirole Rash  . Verapamil Rash   Message routed to Dr Annamaria Boots to advise

## 2019-06-10 NOTE — Telephone Encounter (Signed)
Definitely want to get flu shot anytime in September, October or November. If you can be added in at same time Natalie Hendrix comes in September, that is fine, otherwise I can see you when there is an opening this fall.

## 2019-06-14 ENCOUNTER — Telehealth: Payer: Self-pay | Admitting: Family

## 2019-06-14 NOTE — Telephone Encounter (Signed)
LMVM with appt date/time letter calendar mailed per 7/1 los

## 2019-06-16 ENCOUNTER — Telehealth: Payer: Self-pay | Admitting: Cardiology

## 2019-06-16 DIAGNOSIS — S335XXS Sprain of ligaments of lumbar spine, sequela: Secondary | ICD-10-CM | POA: Diagnosis not present

## 2019-06-16 DIAGNOSIS — M542 Cervicalgia: Secondary | ICD-10-CM | POA: Diagnosis not present

## 2019-06-16 DIAGNOSIS — M546 Pain in thoracic spine: Secondary | ICD-10-CM | POA: Diagnosis not present

## 2019-06-16 DIAGNOSIS — R51 Headache: Secondary | ICD-10-CM | POA: Diagnosis not present

## 2019-06-16 DIAGNOSIS — M791 Myalgia, unspecified site: Secondary | ICD-10-CM | POA: Diagnosis not present

## 2019-06-16 DIAGNOSIS — S134XXS Sprain of ligaments of cervical spine, sequela: Secondary | ICD-10-CM | POA: Diagnosis not present

## 2019-06-16 DIAGNOSIS — G894 Chronic pain syndrome: Secondary | ICD-10-CM | POA: Diagnosis not present

## 2019-06-16 NOTE — Telephone Encounter (Signed)
I called pt to confirm appt. With Dr Ellyn Hack on 06-17-19.        COVID-19 Pre-Screening Questions:   In the past 7 to 10 days have you had a cough,  shortness of breath, headache, congestion, fever (100 or greater) body aches, chills, sore throat, or sudden loss of taste or sense of smell?no  Have you been around anyone with known Covid 19.  Have you been around anyone who is awaiting Covid 19 test results in the past 7 to 10 days? no  Have you been around anyone who has been exposed to Covid 19, or has mentioned symptoms of Covid 19 within the past 7 to 10 days? no  If you have any concerns/questions about symptoms patients report during screening (either on the phone or at threshold). Contact the provider seeing the patient or DOD for further guidance.  If neither are available contact a member of the leadership team.

## 2019-06-17 ENCOUNTER — Other Ambulatory Visit: Payer: Self-pay

## 2019-06-17 ENCOUNTER — Encounter: Payer: Self-pay | Admitting: Cardiology

## 2019-06-17 ENCOUNTER — Ambulatory Visit (INDEPENDENT_AMBULATORY_CARE_PROVIDER_SITE_OTHER): Payer: Medicare HMO | Admitting: Cardiology

## 2019-06-17 VITALS — BP 116/97 | HR 80 | Temp 97.0°F | Ht 66.0 in | Wt 205.0 lb

## 2019-06-17 DIAGNOSIS — J9611 Chronic respiratory failure with hypoxia: Secondary | ICD-10-CM

## 2019-06-17 DIAGNOSIS — G43711 Chronic migraine without aura, intractable, with status migrainosus: Secondary | ICD-10-CM

## 2019-06-17 DIAGNOSIS — I10 Essential (primary) hypertension: Secondary | ICD-10-CM | POA: Diagnosis not present

## 2019-06-17 DIAGNOSIS — D45 Polycythemia vera: Secondary | ICD-10-CM | POA: Diagnosis not present

## 2019-06-17 NOTE — Patient Instructions (Addendum)
Medication Instructions:  NOT NEEDED    Lab work:  NOT NEEDED  Testing/Procedures: will be schedule at  [place] 2021 Your physician has requested that you have an echocardiogram. Echocardiography is a painless test that uses sound waves to create images of your heart. It provides your doctor with information about the size and shape of your heart and how well your heart's chambers and valves are working. This procedure takes approximately one hour. There are no restrictions for this procedure.   Follow-Up: At [facility], you and your health needs are our priority.  As part of our continuing mission to provide you with exceptional heart care, we have created designated Provider Care Teams.  These Care Teams include your primary Cardiologist (physician) and Advanced Practice Providers (APPs -  Physician Assistants and Nurse Practitioners) who all work together to provide you with the care you need, when you need it. . You will need a follow up appointment in  6 months JAN 2021.  Please call our office 2 months in advance to schedule this appointment.  You may see Glenetta Hew, MD or one of the following Advanced Practice Providers on your designated Care Team:   . Rosaria Ferries, PA-C . Jory Sims, DNP, ANP  Any Other Special Instructions Will Be Listed Below (If Applicable).

## 2019-06-17 NOTE — Progress Notes (Signed)
PCP: Natalie Rasmussen, MD  Clinic Note: Chief Complaint  Patient presents with  . New Patient (Initial Visit)    Fam H/o CAD/valve disease & she has PV    HPI: Natalie Hendrix is a 68 y.o. female with [place] who is being seen today for the evaluation of dyspnea and palpitations at the request of Natalie Haggard, FNP.  Natalie Hendrix was last in a clinic visit with Natalie Peace, NP for polycythemia.  Is now almost every 4 weeks getting phlebotomy.  Hemoglobin has been stable.  Noted that she "has her angina "from time to time.  Has never really truly been given a diagnosis of angina.  Recent Hospitalizations:   None  Studies Personally Reviewed - (if available, images/films reviewed: From Epic Chart or Care Everywhere)  No prior studies  Interval History: Natalie Hendrix is here today because in the past when she has been here with her husband (Natalie Hendrix), she has mentioned having polycythemia and chronic dyspnea.  We talked about her seeing me today for just baseline cardiology evaluation. She has chronic oxygen dependent hypoxic respiratory failure at least partially related to COPD.  She has phlebotomy dependent polycythemia vera  Lousy stability - has "hand-holds" around the hose to remain mobile.  Otherwise uses cane or rolling walker--is able to get her heart rate up into the 90s with exercise..  Uses Home O2 - desats with walking -- seasonal asthma & bronchits.  Off and on chest discomfort that may or may not happen with exertion or at rest.  No chest pain or shortness of breath with rest or exertion.  No PND, but does have some mild orthopnea and edema.  No palpitations (stating that these are pretty well controlled with beta-blocker.  Has occasional skipped beats been nothing prolonged), lightheadedness, dizziness, weakness or syncope/near syncope. No TIA/amaurosis fugax symptoms. No melena, hematochezia, hematuria, or epstaxis. No claudication.  More limited by  radicular pain and MSK pain.  The patient does not have symptoms concerning for COVID-19 infection (fever, chills, cough, or new shortness of breath).  The patient is practicing social distancing.   COVID-19 Education: The signs and symptoms of COVID-19 were discussed with the patient and how to seek care for testing (follow up with PCP or arrange E-visit).   The importance of social distancing was discussed today.   ROS: A comprehensive was performed. Review of Systems  Constitutional: Positive for malaise/fatigue. Negative for weight loss.  HENT: Negative for congestion and nosebleeds.   Respiratory: Positive for cough, shortness of breath (Chronic) and wheezing.   Cardiovascular: Positive for leg swelling (Says she does elevate her feet with some benefit.).  Gastrointestinal: Negative for abdominal pain, blood in stool, heartburn (Depending on what she eats) and melena.  Genitourinary: Negative for dysuria (Not recently) and hematuria.  Musculoskeletal: Positive for back pain, falls (Most recent fall was about 6 weeks ago and she hit her head with a mild concussion.) and joint pain.  Neurological: Positive for dizziness, tingling (Has radicular pain down the right leg from her chronic back pain.) and weakness (Global weakness, worse on the right leg).       Steady slow unstable gait.  Psychiatric/Behavioral: Negative for memory loss. The patient is nervous/anxious. The patient does not have insomnia.   All other systems reviewed and are negative.  I have reviewed and (if needed) personally updated the patient's problem list, medications, allergies, past medical and surgical history, social and family history.  Past Medical History:  Diagnosis Date  . Allergic rhinitis   . Allergy   . Anxiety   . Asthma    uses oxygen 2.5 l/m 24/7, sleeps elevated  . Complication of anesthesia    very sensitive to sedatives, B/P drops  . CVA (cerebral vascular accident) ([place]) 1999, 2014    residual left side weakness, dysphagia, word finding difficulty, and short term memory; uses mobile chair, cane  . Dependence on supplemental oxygen   . Depression   . Dizziness   . Fibromyalgia   . Fluttering heart   . GERD (gastroesophageal reflux disease)   . Hyperlipidemia   . Hypertension   . Migraine headache   . Migraine, unspecified, not intractable, without status migrainosus   . Other amnesia   . Polycythemia rubra vera ([place])   . Radiculopathy, site unspecified   . Thoracic outlet syndrome 1981   repair  . TIA (transient ischemic attack)   . Unspecified chronic bronchitis ([place])   . Unspecified dementia without behavioral disturbance ([place])   . Vertigo     Past Surgical History:  Procedure Laterality Date  . APPENDECTOMY    . BALLOON DILATION N/A 10/27/2013   Procedure: BALLOON DILATION;  Surgeon: Arta Silence, MD;  Location: [facility];  Service: Endoscopy;  Laterality: N/A;  . BREAST SURGERY  1979  . CHOLECYSTECTOMY    . CYST REMOVAL HAND Bilateral    left done 10-13-13(Dr. Gramig)  . DILATION AND CURETTAGE OF UTERUS    . ESOPHAGOGASTRODUODENOSCOPY (EGD) WITH PROPOFOL N/A 10/27/2013   Procedure: ESOPHAGOGASTRODUODENOSCOPY (EGD) WITH PROPOFOL;  Surgeon: Arta Silence, MD;  Location: [facility];  Service: Endoscopy;  Laterality: N/A;  . GALLBLADDER SURGERY    . KNEE SURGERY     repair torn ligament/capsule knee cruciat  . REDUCTION MAMMAPLASTY    . RESECTION RIB PARTIAL    . RHINOPLASTY    . Root Resection and Revascularization  1980   of Long thoracic artery  . scalenectomy    . synonectomy      Current Meds  Medication Sig  . albuterol (PROAIR HFA) 108 (90 Base) MCG/ACT inhaler Inhale 2 puffs into the lungs every 6 (six) hours as needed for wheezing or shortness of breath.  Marland Kitchen albuterol (PROVENTIL) (2.5 MG/3ML) 0.083% nebulizer solution Take 3 mLs (2.5 mg total) by nebulization every 6 (six) hours as needed for wheezing or shortness of breath.  Marland Kitchen  azelastine (ASTELIN) 0.1 % nasal spray Place 1 spray into both nostrils 2 (two) times daily.  . beclomethasone (BECONASE-AQ) 42 MCG/SPRAY nasal spray Place 2 sprays into both nostrils 2 (two) times daily. Dose is for each nostril.  . benzonatate (TESSALON) 100 MG capsule Take 1 capsule (100 mg total) by mouth every 6 (six) hours as needed for cough.  Marland Kitchen buPROPion (WELLBUTRIN XL) 300 MG 24 hr tablet Take 1 tablet (300 mg total) by mouth daily with breakfast.  . Calcium Carbonate-Vit D-Min (CALCIUM 1200 PO) Take 1,250 mg by mouth daily.   . clopidogrel (PLAVIX) 75 MG tablet Take 1 tablet (75 mg total) by mouth daily.  . cyclobenzaprine (FLEXERIL) 5 MG tablet Take 5 mg by mouth 2 (two) times daily as needed for muscle spasms. [name]  . ESTRACE VAGINAL 0.1 MG/GM vaginal cream Place 1 Applicatorful vaginally daily. Small amount each dose per pt  . ezetimibe (ZETIA) 10 MG tablet TAKE 1 TABLET BY MOUTH DAILY.  . fexofenadine (ALLEGRA ALLERGY) 180 MG tablet Take 1 tablet (180 mg total) by mouth daily.  Marland Kitchen  fluticasone (FLONASE) 50 MCG/ACT nasal spray Place 2 sprays into both nostrils at bedtime.  . hyoscyamine (ANASPAZ) 0.125 MG TBDP disintergrating tablet Place 1 tablet (0.125 mg total) under the tongue 2 (two) times daily as needed. (Patient taking differently: Place 0.125 mg under the tongue 2 (two) times daily as needed for bladder spasms or cramping. )  . lidocaine (LIDODERM) 5 % Place 1 patch onto the skin as needed (pain). Remove & Discard patch within 12 hours or as directed by MD  . LORazepam (ATIVAN) 1 MG tablet Take 1 tablet (1 mg total) by mouth 2 (two) times daily. May take extra 3rd dose if needed (Patient taking differently: Take 1 mg by mouth daily as needed. May take extra 3rd dose if needed)  . magnesium gluconate (MAGONATE) 500 MG tablet Take 500 mg by mouth daily as needed (constipation).   . meclizine (ANTIVERT) 25 MG tablet Take 1 tablet (25 mg total) by mouth 3 (three) times daily as  needed for dizziness. 3 month supply (Patient taking differently: Take 25 mg by mouth 3 (three) times daily. 3 month supply)  . metoprolol succinate (TOPROL-XL) 50 MG 24 hr tablet Take 1 tablet (50 mg total) by mouth daily with breakfast. (Patient taking differently: Take 75 mg by mouth daily with breakfast. )  . Misc Natural Products (TART CHERRY ADVANCED PO) Take 5 mLs by mouth daily. Juice Concentrate- 1tsp daily  . morphine (MS CONTIN) 30 MG 12 hr tablet Take 30 mg by mouth every 12 (twelve) hours.   Marland Kitchen morphine (MSIR) 15 MG tablet Take 15 mg by mouth every 4 (four) hours as needed for severe pain.  . Multiple Vitamins-Minerals (CENTRUM SILVER) tablet Take 1 tablet by mouth daily.  . Omega-3 Fatty Acids (FISH OIL) 1200 MG CAPS Take 1,200 mg by mouth at bedtime.   . OXYGEN Inhale 2.5 L/hr into the lungs continuous.  . pantoprazole (PROTONIX) 40 MG tablet TAKE 1 TABLET (40 MG TOTAL) BY MOUTH DAILY.  Vladimir Faster Glycol-Propyl Glycol (SYSTANE) 0.4-0.3 % SOLN Apply 1 drop to eye daily as needed (dry eyes).   . pravastatin (PRAVACHOL) 40 MG tablet Take 1 tablet (40 mg total) by mouth at bedtime.  . promethazine (PHENERGAN) 25 MG tablet Take 25 mg by mouth every 8 (eight) hours as needed for nausea or vomiting.   Marland Kitchen Respiratory Therapy Supplies (NEBULIZER) DEVI Use as directed  . ZOVIRAX 5 % Apply 1 application topically as needed (flair).     Allergies  Allergen Reactions  . Epinephrine Palpitations    Raises heart rate   . Protriptyline Hcl Other (See Comments)    Severe constipation  . Tamiflu [Oseltamivir Phosphate] Other (See Comments)    "like I was having a stroke"  . Tizanidine Other (See Comments)    panic  . Neurontin [Gabapentin] Other (See Comments)    arthralgia  . Other Hives and Rash    boiron acteane  . Qvar [Beclomethasone] Other (See Comments)  . Spiriva [Tiotropium Bromide Monohydrate] Rash  . Tiotropium Rash  . Zonegran Other (See Comments)    Mood swings  . Advair  Diskus [Fluticasone-Salmeterol] Rash  . Baclofen Rash  . Chlorzoxazone Rash  . Lamotrigine Rash  . Levofloxacin Rash  . Protriptyline Rash  . Ropinirole Rash  . Verapamil Rash    Social History   Tobacco Use  . Smoking status: Never Smoker  . Smokeless tobacco: Never Used  . Tobacco comment: never used tobacco; secondary smoke exposure  Substance Use  Topics  . Alcohol use: Not Currently    Alcohol/week: 0.0 standard drinks    Comment: seldom, wine @ christmas  . Drug use: No   Social History   Social History Narrative   Lives at home with husband  Engineer, technical sales) and service dog   Right handed   Caffeine: tries to avoid     family history includes Allergies in an other family member; Cancer in her mother; Cervical cancer in her mother; Clotting disorder in her mother; Emphysema in her father and another family member; Heart attack in an other family member; Heart disease in her father and mother; Hypertension in her mother and another family member; Irregular heart beat in her mother; Lung cancer in her father; Other in her father and other family members; Rheum arthritis in an other family member; Skin cancer in her father; Stroke in some other family members.  Wt Readings from Last 3 Encounters:  06/17/19 205 lb (93 kg)  06/02/19 206 lb 1.9 oz (93.5 kg)  04/21/19 209 lb 3.2 oz (94.9 kg)    PHYSICAL EXAM BP (!) 116/97   Pulse 80   Temp (!) 97 F (36.1 C)   Ht 5\' 6"  (1.676 m)   Wt 205 lb (93 kg)   SpO2 97%   BMI 33.09 kg/m  Physical Exam  Constitutional: She is oriented to person, place, and time. She appears well-developed and well-nourished.  Somewhat chronically ill-appearing, but no acute distress.  HENT:  Head: Normocephalic and atraumatic.  Eyes: Conjunctivae and EOM are normal.  Neck: Normal range of motion. Neck supple. No hepatojugular reflux and no JVD present. Carotid bruit is not present.  Cardiovascular: Normal rate, regular rhythm, S1 normal and S2  normal.  No extrasystoles are present. PMI is not displaced. Exam reveals distant heart sounds and decreased pulses (Mildly decreased pedal pulses). Exam reveals no gallop and no friction rub.  No murmur heard. Pulmonary/Chest: Effort normal. No respiratory distress. She has no wheezes. She has no rales.  Diminished breath sounds throughout with mild interstitial sounds; is using nasal cannula oxygen  Abdominal: Soft. Bowel sounds are normal. She exhibits no distension. There is no abdominal tenderness. There is no rebound.  Musculoskeletal:        General: Edema (Trivial) present. No deformity.     Comments: Somewhat unsteady slow gait.  Uses a cane and rolling walker.  Has a combination cane with pickup stick.  Neurological: She is alert and oriented to person, place, and time. No cranial nerve deficit.  Skin: Skin is warm and dry. No rash noted. No erythema.  Psychiatric: She has a normal mood and affect. Her behavior is normal. Judgment and thought content normal.  Vitals reviewed.   Adult ECG Report  Rate: 80 ;  Rhythm: normal sinus rhythm and Low voltage.  Cannot exclude septal MI, age undetermined.;   Narrative Interpretation: Borderline EKG   Other studies Reviewed: Additional studies/ records that were reviewed today include:  Recent Labs:   Lab Results  Component Value Date   CREATININE 0.83 06/02/2019   BUN 15 06/02/2019   NA 142 06/02/2019   K 4.5 06/02/2019   CL 106 06/02/2019   CO2 28 06/02/2019   CBC Latest Ref Rng & Units 06/02/2019 04/21/2019 03/15/2019  WBC 4.0 - 10.5 K/uL 5.9 4.9 5.1  Hemoglobin 12.0 - 15.0 g/dL 11.6(L) 11.6(L) 11.0(L)  Hematocrit 36.0 - 46.0 % 38.9 38.6 36.9  Platelets 150 - 400 K/uL 266 259 272   Lab Results  Component Value Date   CHOL 169 10/17/2016   HDL 69.30 10/17/2016   LDLCALC 80 10/17/2016   TRIG 99.0 10/17/2016   CHOLHDL 2 10/17/2016    ASSESSMENT / PLAN: Problem List Items Addressed This Visit    Polycythemia vera ([place])  (Chronic)    Managed using phlebotomy intermittently.  Now doing  Has never had a cardiac evaluation.  With her having chronic dyspnea, not unreasonable to evaluate with 2D echocardiogram to assess for any evidence of LVH or other structural normality.      Relevant Orders   EKG 12-Lead (Completed)   ECHOCARDIOGRAM COMPLETE   Essential hypertension (Chronic)    Well-controlled on Toprol 50mg  usually also keeps palpitations under control.      Chronic respiratory failure with hypoxia (HCC) - Primary    Oxygen dependent hypoxic respiratory failure.  Need to exclude any evidence of pulmonary hypertension or left-sided heart failure. Plan: Check 2D echo.      Relevant Orders   EKG 12-Lead (Completed)   ECHOCARDIOGRAM COMPLETE   Chronic migraine without aura, with intractable migraine, so stated, with status migrainosus (Chronic)    Question if there is any potential cardiac etiology.  Will check 2D echo.         I spent a total of 62minutes with the patient and chart review. >  50% of the time was spent in direct patient consultation.   Current medicines are reviewed at length with the patient today.  (+/- concerns) n/a The following changes have been made:  n/a  Patient Instructions  Medication Instructions:  NOT NEEDED    Lab work:  NOT NEEDED  Testing/Procedures: will be schedule at  [place] 2021 Your physician has requested that you have an echocardiogram. Echocardiography is a painless test that uses sound waves to create images of your heart. It provides your doctor with information about the size and shape of your heart and how well your heart's chambers and valves are working. This procedure takes approximately one hour. There are no restrictions for this procedure.   Follow-Up: At [facility], you and your health needs are our priority.  As part of our continuing mission to provide you with exceptional heart care, we have created  designated Provider Care Teams.  These Care Teams include your primary Cardiologist (physician) and Advanced Practice Providers (APPs -  Physician Assistants and Nurse Practitioners) who all work together to provide you with the care you need, when you need it. . You will need a follow up appointment in  6 months JAN 2021.  Please call our office 2 months in advance to schedule this appointment.  You may see Glenetta Hew, MD or one of the following Advanced Practice Providers on your designated Care Team:   . Rosaria Ferries, PA-C . Jory Sims, DNP, ANP  Any Other Special Instructions Will Be Listed Below (If Applicable).    Studies Ordered:   Orders Placed This Encounter  Procedures  . EKG 12-Lead  . ECHOCARDIOGRAM COMPLETE      Glenetta Hew, M.D., M.S. Interventional Cardiologist   Pager # [phone] Phone # [phone] [address] [place], [place] [address]   Thank you for choosing [facility] at [facility]!!

## 2019-06-22 ENCOUNTER — Encounter: Payer: Self-pay | Admitting: Cardiology

## 2019-06-22 DIAGNOSIS — R69 Illness, unspecified: Secondary | ICD-10-CM | POA: Diagnosis not present

## 2019-06-22 NOTE — Assessment & Plan Note (Signed)
Well-controlled on Toprol 50mg  usually also keeps palpitations under control.

## 2019-06-22 NOTE — Assessment & Plan Note (Signed)
Question if there is any potential cardiac etiology.  Will check 2D echo.

## 2019-06-22 NOTE — Assessment & Plan Note (Signed)
Oxygen dependent hypoxic respiratory failure.  Need to exclude any evidence of pulmonary hypertension or left-sided heart failure. Plan: Check 2D echo.

## 2019-06-22 NOTE — Assessment & Plan Note (Signed)
Managed using phlebotomy intermittently.  Now doing  Has never had a cardiac evaluation.  With her having chronic dyspnea, not unreasonable to evaluate with 2D echocardiogram to assess for any evidence of LVH or other structural normality.

## 2019-06-24 DIAGNOSIS — E785 Hyperlipidemia, unspecified: Secondary | ICD-10-CM | POA: Diagnosis not present

## 2019-06-24 DIAGNOSIS — D45 Polycythemia vera: Secondary | ICD-10-CM | POA: Diagnosis not present

## 2019-06-24 DIAGNOSIS — I639 Cerebral infarction, unspecified: Secondary | ICD-10-CM | POA: Diagnosis not present

## 2019-06-24 DIAGNOSIS — R0902 Hypoxemia: Secondary | ICD-10-CM | POA: Diagnosis not present

## 2019-06-25 DIAGNOSIS — M25571 Pain in right ankle and joints of right foot: Secondary | ICD-10-CM | POA: Diagnosis not present

## 2019-06-25 DIAGNOSIS — I10 Essential (primary) hypertension: Secondary | ICD-10-CM | POA: Diagnosis not present

## 2019-06-25 DIAGNOSIS — R69 Illness, unspecified: Secondary | ICD-10-CM | POA: Diagnosis not present

## 2019-06-25 DIAGNOSIS — I639 Cerebral infarction, unspecified: Secondary | ICD-10-CM | POA: Diagnosis not present

## 2019-06-30 ENCOUNTER — Other Ambulatory Visit: Payer: Medicare HMO

## 2019-07-05 ENCOUNTER — Other Ambulatory Visit: Payer: Self-pay

## 2019-07-05 ENCOUNTER — Inpatient Hospital Stay: Payer: Medicare HMO | Attending: Hematology & Oncology

## 2019-07-05 ENCOUNTER — Encounter: Payer: Self-pay | Admitting: Family

## 2019-07-05 ENCOUNTER — Inpatient Hospital Stay (HOSPITAL_BASED_OUTPATIENT_CLINIC_OR_DEPARTMENT_OTHER): Payer: Medicare HMO | Admitting: Family

## 2019-07-05 ENCOUNTER — Inpatient Hospital Stay: Payer: Medicare HMO

## 2019-07-05 VITALS — HR 76 | Temp 97.1°F | Resp 18 | Wt 205.8 lb

## 2019-07-05 DIAGNOSIS — G629 Polyneuropathy, unspecified: Secondary | ICD-10-CM | POA: Insufficient documentation

## 2019-07-05 DIAGNOSIS — R0602 Shortness of breath: Secondary | ICD-10-CM | POA: Insufficient documentation

## 2019-07-05 DIAGNOSIS — D500 Iron deficiency anemia secondary to blood loss (chronic): Secondary | ICD-10-CM | POA: Insufficient documentation

## 2019-07-05 DIAGNOSIS — Z9981 Dependence on supplemental oxygen: Secondary | ICD-10-CM | POA: Diagnosis not present

## 2019-07-05 DIAGNOSIS — D751 Secondary polycythemia: Secondary | ICD-10-CM

## 2019-07-05 LAB — CBC WITH DIFFERENTIAL (CANCER CENTER ONLY)
Abs Immature Granulocytes: 0.01 10*3/uL (ref 0.00–0.07)
Basophils Absolute: 0.1 10*3/uL (ref 0.0–0.1)
Basophils Relative: 1 %
Eosinophils Absolute: 0.2 10*3/uL (ref 0.0–0.5)
Eosinophils Relative: 2 %
HCT: 35.6 % — ABNORMAL LOW (ref 36.0–46.0)
Hemoglobin: 10.5 g/dL — ABNORMAL LOW (ref 12.0–15.0)
Immature Granulocytes: 0 %
Lymphocytes Relative: 31 %
Lymphs Abs: 1.9 10*3/uL (ref 0.7–4.0)
MCH: 21.7 pg — ABNORMAL LOW (ref 26.0–34.0)
MCHC: 29.5 g/dL — ABNORMAL LOW (ref 30.0–36.0)
MCV: 73.6 fL — ABNORMAL LOW (ref 80.0–100.0)
Monocytes Absolute: 0.6 10*3/uL (ref 0.1–1.0)
Monocytes Relative: 10 %
Neutro Abs: 3.4 10*3/uL (ref 1.7–7.7)
Neutrophils Relative %: 56 %
Platelet Count: 276 10*3/uL (ref 150–400)
RBC: 4.84 MIL/uL (ref 3.87–5.11)
RDW: 15.3 % (ref 11.5–15.5)
WBC Count: 6.1 10*3/uL (ref 4.0–10.5)
nRBC: 0 % (ref 0.0–0.2)

## 2019-07-05 LAB — CMP (CANCER CENTER ONLY)
ALT: 16 U/L (ref 0–44)
AST: 20 U/L (ref 15–41)
Albumin: 3.8 g/dL (ref 3.5–5.0)
Alkaline Phosphatase: 61 U/L (ref 38–126)
Anion gap: 6 (ref 5–15)
BUN: 14 mg/dL (ref 8–23)
CO2: 27 mmol/L (ref 22–32)
Calcium: 8.4 mg/dL — ABNORMAL LOW (ref 8.9–10.3)
Chloride: 107 mmol/L (ref 98–111)
Creatinine: 0.77 mg/dL (ref 0.44–1.00)
GFR, Est AFR Am: >60 mL/min (ref >60)
GFR, Estimated: >60 mL/min (ref >60)
Glucose, Bld: 96 mg/dL (ref 70–99)
Potassium: 4.3 mmol/L (ref 3.5–5.1)
Sodium: 140 mmol/L (ref 135–145)
Total Bilirubin: 0.4 mg/dL (ref 0.3–1.2)
Total Protein: 6 g/dL — ABNORMAL LOW (ref 6.5–8.1)

## 2019-07-05 NOTE — Progress Notes (Signed)
Hematology and Oncology Follow Up Visit  Natalie Hendrix [id number] 02/25/1951 68 y.o. 07/05/2019   Principle Diagnosis:  Secondary polycythemia vera- JAK2 negative  Current Therapy:   Phlebotomy to maintain hematocrit less than 38% Plavix 75 mg by mouth daily   Interim History:  Natalie Hendrix is here today for follow-up. She is doing fairly well but has noted that her memory has not "caught up" since her fall several months ago. She was able to follow-up with Dr. [name] about this and they are watching.  She has not had any new falls or syncopal episodes.  She is rescheduled her thoracic MRI and will be having this soon.  Her SOB is stable and she continues to use supplemental O2 24 hours a day.  She has headaches off and on.  No fever, chills, n/v, cough, rash, dizziness, chest pain, palpitations, abdominal pain or changes in bowel or bladder habits.   No episodes of bleeding. She does bruise on her arms but not in excess. No petechiae.  No swelling or tenderness in her extremities. The neuropathy in her hands and feet is stable.  Her appetite comes and goes but she is staying hydrated. Her weight is stable.   ECOG Performance Status: 1 - Symptomatic but completely ambulatory  Medications:  Allergies as of 07/05/2019      Reactions   Epinephrine Palpitations   Raises heart rate   Protriptyline Hcl Other (See Comments)   Severe constipation   Tamiflu [oseltamivir Phosphate] Other (See Comments)   "like I was having a stroke"   Tizanidine Other (See Comments)   panic   Neurontin [gabapentin] Other (See Comments)   arthralgia   Other Hives, Rash   boiron acteane   Qvar [beclomethasone] Other (See Comments)   Spiriva [tiotropium Bromide Monohydrate] Rash   Tiotropium Rash   Zonegran Other (See Comments)   Mood swings   Advair Diskus [fluticasone-salmeterol] Rash   Baclofen Rash   Chlorzoxazone Rash   Lamotrigine Rash   Levofloxacin Rash   Protriptyline Rash   Ropinirole  Rash   Verapamil Rash      Medication List       Accurate as of July 05, 2019 10:47 AM. If you have any questions, ask your nurse or doctor.        albuterol 108 (90 Base) MCG/ACT inhaler Commonly known as: ProAir HFA Inhale 2 puffs into the lungs every 6 (six) hours as needed for wheezing or shortness of breath.   albuterol (2.5 MG/3ML) 0.083% nebulizer solution Commonly known as: PROVENTIL Take 3 mLs (2.5 mg total) by nebulization every 6 (six) hours as needed for wheezing or shortness of breath.   azelastine 0.1 % nasal spray Commonly known as: ASTELIN Place 1 spray into both nostrils 2 (two) times daily.   beclomethasone 42 MCG/SPRAY nasal spray Commonly known as: BECONASE-AQ Place 2 sprays into both nostrils 2 (two) times daily. Dose is for each nostril.   benzonatate 100 MG capsule Commonly known as: TESSALON Take 1 capsule (100 mg total) by mouth every 6 (six) hours as needed for cough.   buPROPion 300 MG 24 hr tablet Commonly known as: WELLBUTRIN XL Take 1 tablet (300 mg total) by mouth daily with breakfast.   CALCIUM 1200 PO Take 1,250 mg by mouth daily.   Centrum Silver tablet Take 1 tablet by mouth daily.   clopidogrel 75 MG tablet Commonly known as: PLAVIX Take 1 tablet (75 mg total) by mouth daily.   cyclobenzaprine 5 MG  tablet Commonly known as: FLEXERIL Take 5 mg by mouth 2 (two) times daily as needed for muscle spasms. Phillips   ESTRACE VAGINAL 0.1 MG/GM vaginal cream Generic drug: estradiol Place 1 Applicatorful vaginally daily. Small amount each dose per pt   ezetimibe 10 MG tablet Commonly known as: Zetia TAKE 1 TABLET BY MOUTH DAILY.   fexofenadine 180 MG tablet Commonly known as: Allegra Allergy Take 1 tablet (180 mg total) by mouth daily.   Fish Oil 1200 MG Caps Take 1,200 mg by mouth at bedtime.   fluticasone 50 MCG/ACT nasal spray Commonly known as: FLONASE Place 2 sprays into both nostrils at bedtime.   hyoscyamine 0.125  MG Tbdp disintergrating tablet Commonly known as: ANASPAZ Place 1 tablet (0.125 mg total) under the tongue 2 (two) times daily as needed. What changed: reasons to take this   lidocaine 5 % Commonly known as: LIDODERM Place 1 patch onto the skin as needed (pain). Remove & Discard patch within 12 hours or as directed by MD   LORazepam 1 MG tablet Commonly known as: ATIVAN Take 1 tablet (1 mg total) by mouth 2 (two) times daily. May take extra 3rd dose if needed What changed:   when to take this  reasons to take this   magnesium gluconate 500 MG tablet Commonly known as: MAGONATE Take 500 mg by mouth daily as needed (constipation).   meclizine 25 MG tablet Commonly known as: ANTIVERT Take 1 tablet (25 mg total) by mouth 3 (three) times daily as needed for dizziness. 3 month supply What changed: when to take this   metoprolol succinate 50 MG 24 hr tablet Commonly known as: TOPROL-XL Take 1 tablet (50 mg total) by mouth daily with breakfast. What changed: how much to take   morphine 15 MG tablet Commonly known as: MSIR Take 15 mg by mouth every 4 (four) hours as needed for severe pain.   morphine 30 MG 12 hr tablet Commonly known as: MS CONTIN Take 30 mg by mouth every 12 (twelve) hours.   Nebulizer Devi Use as directed   OXYGEN Inhale 2.5 L/hr into the lungs continuous.   pantoprazole 40 MG tablet Commonly known as: PROTONIX TAKE 1 TABLET (40 MG TOTAL) BY MOUTH DAILY.   pravastatin 40 MG tablet Commonly known as: PRAVACHOL Take 1 tablet (40 mg total) by mouth at bedtime.   promethazine 25 MG tablet Commonly known as: PHENERGAN Take 25 mg by mouth every 8 (eight) hours as needed for nausea or vomiting.   Systane 0.4-0.3 % Soln Generic drug: Polyethyl Glycol-Propyl Glycol Apply 1 drop to eye daily as needed (dry eyes).   TART CHERRY ADVANCED PO Take 5 mLs by mouth daily. Juice Concentrate- 1tsp daily   Zovirax 5 % Generic drug: acyclovir ointment Apply 1  application topically as needed (flair).       Allergies:  Allergies  Allergen Reactions  . Epinephrine Palpitations    Raises heart rate   . Protriptyline Hcl Other (See Comments)    Severe constipation  . Tamiflu [Oseltamivir Phosphate] Other (See Comments)    "like I was having a stroke"  . Tizanidine Other (See Comments)    panic  . Neurontin [Gabapentin] Other (See Comments)    arthralgia  . Other Hives and Rash    boiron acteane  . Qvar [Beclomethasone] Other (See Comments)  . Spiriva [Tiotropium Bromide Monohydrate] Rash  . Tiotropium Rash  . Zonegran Other (See Comments)    Mood swings  . Advair Diskus [  Fluticasone-Salmeterol] Rash  . Baclofen Rash  . Chlorzoxazone Rash  . Lamotrigine Rash  . Levofloxacin Rash  . Protriptyline Rash  . Ropinirole Rash  . Verapamil Rash    Past Medical History, Surgical history, Social history, and Family History were reviewed and updated.  Review of Systems: All other 10 point review of systems is negative.   Physical Exam:  vitals were not taken for this visit.   Wt Readings from Last 3 Encounters:  06/17/19 205 lb (93 kg)  06/02/19 206 lb 1.9 oz (93.5 kg)  04/21/19 209 lb 3.2 oz (94.9 kg)    Ocular: Sclerae unicteric, pupils equal, round and reactive to light Ear-nose-throat: Oropharynx clear, dentition fair Lymphatic: No cervical or supraclavicular adenopathy Lungs no rales or rhonchi, good excursion bilaterally Heart regular rate and rhythm, no murmur appreciated Abd soft, nontender, positive bowel sounds, no liver or spleen tip palpated on exam, no fluid wave  MSK no focal spinal tenderness, no joint edema Neuro: non-focal, well-oriented, appropriate affect Breasts: Deferred   Lab Results  Component Value Date   WBC 6.1 07/05/2019   HGB 10.5 (L) 07/05/2019   HCT 35.6 (L) 07/05/2019   MCV 73.6 (L) 07/05/2019   PLT 276 07/05/2019   Lab Results  Component Value Date   FERRITIN <4 (L) 06/02/2019   IRON 26  (L) 06/02/2019   TIBC 480 (H) 06/02/2019   UIBC 454 (H) 06/02/2019   IRONPCTSAT 5 (L) 06/02/2019   Lab Results  Component Value Date   RETICCTPCT 1.1 04/27/2015   RBC 4.84 07/05/2019   RETICCTABS 55.1 04/27/2015   No results found for: KPAFRELGTCHN, LAMBDASER, KAPLAMBRATIO No results found for: Kandis Cocking, IGMSERUM No results found for: Odetta Pink, SPEI   Chemistry      Component Value Date/Time   NA 142 06/02/2019 1028   NA 147 (H) 10/08/2017 1410   NA 141 02/17/2017 1132   K 4.5 06/02/2019 1028   K 4.6 10/08/2017 1410   K 5.3 (H) 02/17/2017 1132   CL 106 06/02/2019 1028   CL 106 10/08/2017 1410   CO2 28 06/02/2019 1028   CO2 30 10/08/2017 1410   CO2 29 02/17/2017 1132   BUN 15 06/02/2019 1028   BUN 15 10/08/2017 1410   BUN 17.8 02/17/2017 1132   CREATININE 0.83 06/02/2019 1028   CREATININE 1.0 10/08/2017 1410   CREATININE 0.8 02/17/2017 1132      Component Value Date/Time   CALCIUM 9.3 06/02/2019 1028   CALCIUM 9.0 10/08/2017 1410   CALCIUM 9.3 02/17/2017 1132   ALKPHOS 68 06/02/2019 1028   ALKPHOS 59 10/08/2017 1410   ALKPHOS 79 02/17/2017 1132   AST 20 06/02/2019 1028   AST 22 02/17/2017 1132   ALT 16 06/02/2019 1028   ALT 26 10/08/2017 1410   ALT 20 02/17/2017 1132   BILITOT 0.3 06/02/2019 1028   BILITOT 0.39 02/17/2017 1132       Impression and Plan: Ms. Yeater is a very pleasant 68 yo caucasian female with secondary polycythemia (JAK-2 negative) due to COPD. Hct is stable at 35.6% so no phlebotomy needed this visit.  We will plan to see her back in another months.  She will contact our office with any questions or concerns. We can certainly see her sooner if needed.   [name], NP 8/3/202010:47 AM

## 2019-07-06 LAB — IRON AND TIBC
Iron: 33 ug/dL — ABNORMAL LOW (ref 41–142)
Saturation Ratios: 7 % — ABNORMAL LOW (ref 21–57)
TIBC: 485 ug/dL — ABNORMAL HIGH (ref 236–444)
UIBC: 452 ug/dL — ABNORMAL HIGH (ref 120–384)

## 2019-07-06 LAB — FERRITIN: Ferritin: <4 ng/mL — ABNORMAL LOW (ref 11–307)

## 2019-07-12 ENCOUNTER — Telehealth: Payer: Self-pay | Admitting: Internal Medicine

## 2019-07-12 ENCOUNTER — Other Ambulatory Visit: Payer: Self-pay | Admitting: *Deleted

## 2019-07-12 ENCOUNTER — Encounter: Payer: Self-pay | Admitting: Hematology & Oncology

## 2019-07-12 DIAGNOSIS — D45 Polycythemia vera: Secondary | ICD-10-CM

## 2019-07-12 DIAGNOSIS — Z8673 Personal history of transient ischemic attack (TIA), and cerebral infarction without residual deficits: Secondary | ICD-10-CM

## 2019-07-12 MED ORDER — CLOPIDOGREL BISULFATE 75 MG PO TABS: 75.0000 mg | ORAL_TABLET | Freq: Every day | ORAL | 0 refills | Status: DC

## 2019-07-12 MED ORDER — BENZONATATE 100 MG PO CAPS: 100.0000 mg | ORAL_CAPSULE | Freq: Four times a day (QID) | ORAL | 3 refills | Status: DC | PRN

## 2019-07-12 MED ORDER — CLOPIDOGREL BISULFATE 75 MG PO TABS: 75.0000 mg | ORAL_TABLET | Freq: Every day | ORAL | 3 refills | Status: DC

## 2019-07-12 MED ORDER — AZELASTINE HCL 0.1 % NA SOLN: 1.0000 | Freq: Two times a day (BID) | NASAL | 3 refills | Status: DC

## 2019-07-12 NOTE — Telephone Encounter (Signed)
Called and spoke with pt letting her know that CY said it was okay for [place] to see her same day as her appt with CY. Pt verbalized understanding. Pt's appt has been scheduled. Nothing further needed.

## 2019-07-12 NOTE — Telephone Encounter (Signed)
Attempted to call pt to see if the appt she was wanting was a routine f/u appt or if there was another reason for the appt but unable to reach her. Left message for pt to return call.  While we are waiting on pt to return call, Dr. Annamaria Boots, please advise if you would be fine for [place] to add pt on your schedule in the same appt slot as her husband on 9/10 at 3:30 with you. Thanks!

## 2019-07-12 NOTE — Telephone Encounter (Signed)
Yes, ok to double them up

## 2019-07-16 ENCOUNTER — Other Ambulatory Visit: Payer: Self-pay

## 2019-07-16 DIAGNOSIS — Z8673 Personal history of transient ischemic attack (TIA), and cerebral infarction without residual deficits: Secondary | ICD-10-CM

## 2019-07-16 DIAGNOSIS — D45 Polycythemia vera: Secondary | ICD-10-CM

## 2019-07-16 MED ORDER — CLOPIDOGREL BISULFATE 75 MG PO TABS: 75.0000 mg | ORAL_TABLET | Freq: Every day | ORAL | 0 refills | Status: DC

## 2019-07-25 DIAGNOSIS — I639 Cerebral infarction, unspecified: Secondary | ICD-10-CM | POA: Diagnosis not present

## 2019-07-25 DIAGNOSIS — D45 Polycythemia vera: Secondary | ICD-10-CM | POA: Diagnosis not present

## 2019-07-25 DIAGNOSIS — E785 Hyperlipidemia, unspecified: Secondary | ICD-10-CM | POA: Diagnosis not present

## 2019-07-25 DIAGNOSIS — R0902 Hypoxemia: Secondary | ICD-10-CM | POA: Diagnosis not present

## 2019-07-26 ENCOUNTER — Telehealth: Payer: Self-pay | Admitting: Family

## 2019-07-26 ENCOUNTER — Other Ambulatory Visit: Payer: Medicare HMO

## 2019-07-26 DIAGNOSIS — M25571 Pain in right ankle and joints of right foot: Secondary | ICD-10-CM | POA: Diagnosis not present

## 2019-07-26 DIAGNOSIS — I639 Cerebral infarction, unspecified: Secondary | ICD-10-CM | POA: Diagnosis not present

## 2019-07-26 DIAGNOSIS — J029 Acute pharyngitis, unspecified: Secondary | ICD-10-CM | POA: Diagnosis not present

## 2019-07-26 DIAGNOSIS — I10 Essential (primary) hypertension: Secondary | ICD-10-CM | POA: Diagnosis not present

## 2019-07-26 NOTE — Telephone Encounter (Signed)
Called nd LMVM for patient regarding appointments from 9/3 being moved to 9/1due to Provider PAL

## 2019-08-03 ENCOUNTER — Inpatient Hospital Stay: Payer: Medicare HMO

## 2019-08-03 ENCOUNTER — Inpatient Hospital Stay: Payer: Medicare HMO | Admitting: Family

## 2019-08-05 ENCOUNTER — Other Ambulatory Visit: Payer: Medicare HMO

## 2019-08-05 ENCOUNTER — Ambulatory Visit: Payer: Medicare HMO | Admitting: Family

## 2019-08-12 ENCOUNTER — Encounter: Payer: Self-pay | Admitting: Internal Medicine

## 2019-08-12 ENCOUNTER — Ambulatory Visit (INDEPENDENT_AMBULATORY_CARE_PROVIDER_SITE_OTHER): Payer: Medicare HMO

## 2019-08-12 ENCOUNTER — Other Ambulatory Visit: Payer: Self-pay

## 2019-08-12 ENCOUNTER — Ambulatory Visit (INDEPENDENT_AMBULATORY_CARE_PROVIDER_SITE_OTHER): Payer: Medicare HMO | Admitting: Internal Medicine

## 2019-08-12 VITALS — BP 116/60 | HR 78 | Temp 97.9°F | Ht 67.0 in | Wt 208.8 lb

## 2019-08-12 DIAGNOSIS — R69 Illness, unspecified: Secondary | ICD-10-CM | POA: Diagnosis not present

## 2019-08-12 DIAGNOSIS — J9611 Chronic respiratory failure with hypoxia: Secondary | ICD-10-CM

## 2019-08-12 DIAGNOSIS — J302 Other seasonal allergic rhinitis: Secondary | ICD-10-CM | POA: Diagnosis not present

## 2019-08-12 DIAGNOSIS — J9610 Chronic respiratory failure, unspecified whether with hypoxia or hypercapnia: Secondary | ICD-10-CM | POA: Diagnosis not present

## 2019-08-12 DIAGNOSIS — Z23 Encounter for immunization: Secondary | ICD-10-CM

## 2019-08-12 DIAGNOSIS — J3089 Other allergic rhinitis: Secondary | ICD-10-CM

## 2019-08-12 NOTE — Progress Notes (Signed)
HPI female never smoker followed for asthma, history pneumonia, cough, complicated by past history for thoracic outlet syndrome, chronic hypoxic respiratory failure, complicated by CVA, GERD, allergic rhinitis, polycythemia, depression O2 2 L sleep and prn/Advanced  Nl PFT 2012 except DLCO 62% Allergy profile was NEG, 4.9 total IgE GI/ Dr Paulita Fujita: Ba swallow> stricture and probably mild aspiration Residual right diaphragm elevation after surgery for right thoracic outlet syndrome --------------------------------------------------------------------------------  10/26/2018- 68 year old female never smoker followed for asthma, history pneumonia, cough, chronic hypoxic respiratory failure,  past history for thoracic outlet syndrome, chronic elevation right diaphragm,complicated by CVA, GERD, allergic rhinitis, polycythemia, depression O2 2-3 L sleep and prn/Advanced   POC = [facility]. NP saw 11/1 and treated for right otitis with Augmentin after Z-Pak. -----6 month follow up for resp failure. And needs RX refills.  Azelastine nasal spray, benzonatate, albuterol HFA, Beconase AQ nasal spray, neb albuterol, Polycythemia being treated with phlebotomy. Had URI/allergic rhinitis in the spring, followed by vertigo.  Those have resolved.  She takes Benadryl occasionally for itchy eyes and skin.  We discussed possibility of overdrying from antihistamines.  08/12/2019- 68 year old female never smoker followed for asthma, history pneumonia, cough, chronic hypoxic respiratory failure,  past history for thoracic outlet syndrome, chronic elevation right diaphragm,complicated by CVA, GERD, allergic rhinitis, polycythemia, depression O2 2-3 L sleep and prn/Advanced   POC = [facility]. Lab- Hgb 10.5 Fe def from treatment for polycythemia ------pt on O2 2L qhs and prn Some SAR symptoms currently. Flonase, allegra, singulair. Husband installed [employer] on [employer]. Fine till ragweed season this month. Wets her mask  for better seal. Covid careful.  Occ vertigo- PCP aware.   Review of Systems- See HPI   + = positive Constitutional:   No-   weight loss, night sweats, fevers, chills, fatigue, lassitude. HEENT:   frequent  headaches,  Some difficulty swallowing,, sore throat,       No-  Sneezing,+ itching, ear ache, nasal congestion, post nasal drip,  CV:  No-   chest pain, orthopnea, PND, swelling in lower extremities, anasarca, dizziness, palpitations Resp: + shortness of breath with exertion or at rest. productive cough              No-  coughing up of blood.              No-  change in color of mucus.   wheezing.   Skin: No-   rash or lesions. GI:  No-   heartburn, indigestion, abdominal pain, nausea, vomiting, GU:  MS:  No-   joint pain or swelling.   Neuro- + tremor and poor balance Psych:  No- change in mood or affect. No depression or anxiety.  No memory loss.    Objective:   Physical Exam General- Alert, Oriented, Affect+ tearful, Distress- none acute,  Overweight.  + Rolling walker              O2 POC 2L. Skin- rash-none, lesions- none, excoriation- none Lymphadenopathy- none Head- atraumatic            Eyes- Gross vision intact, PERRLA, conjunctivae clear secretions            Ears- Hearing, canals            Nose- Clear, No- Septal dev, mucus, polyps, erosion, perforation             Throat- Mallampati III , mucosa clear , drainage- none, tonsils- atrophic, Neck- flexible , trachea midline, no stridor , thyroid nl, carotid no  bruit Chest - symmetrical excursion , unlabored           Heart/CV- RRR , no murmur , no gallop  , no rub, nl s1 s2                           - JVD- none , edema- none, stasis changes- none, varices- none           Lung- clear to P&A, wheeze-none, cough-none,                                                    dullness-none, rub- none, unlabored           Chest wall- +Vascular surgery scar Right Upper Anterior chest Abd- Br/ Gen/ Rectal- Not done, not  indicated Extrem- cyanosis- none, clubbing, none, atrophy- none, strength- . +Rolling walker Neuro- + some word searching

## 2019-08-12 NOTE — Assessment & Plan Note (Addendum)
Remains O2 dependent- discussed, especially given iatrogenic anemia.  Plan- continue O2 2-3L, CXR,  Flu vax

## 2019-08-12 NOTE — Assessment & Plan Note (Signed)
Discussed alternative antihistamines, but current meds generally sufficient.

## 2019-08-12 NOTE — Patient Instructions (Addendum)
Order- Flu vax senior  Order- CXR   Dx chronic respiratory failure with hypoxia   We can continue current meds

## 2019-08-16 ENCOUNTER — Telehealth: Payer: Self-pay | Admitting: Internal Medicine

## 2019-08-16 DIAGNOSIS — M79645 Pain in left finger(s): Secondary | ICD-10-CM | POA: Diagnosis not present

## 2019-08-16 NOTE — Telephone Encounter (Signed)
  Notes recorded by [name], MD on 08/13/2019 at 4:15 PM EDT  CXR- old surgical changes, including elevated right diaphragm. No new or active process.  Advised pt of results. Pt understood and nothing further is needed.

## 2019-08-17 ENCOUNTER — Other Ambulatory Visit: Payer: Medicare HMO

## 2019-08-17 ENCOUNTER — Ambulatory Visit: Payer: Medicare HMO | Admitting: Family

## 2019-08-19 DIAGNOSIS — M7918 Myalgia, other site: Secondary | ICD-10-CM | POA: Diagnosis not present

## 2019-08-19 DIAGNOSIS — S335XXS Sprain of ligaments of lumbar spine, sequela: Secondary | ICD-10-CM | POA: Diagnosis not present

## 2019-08-19 DIAGNOSIS — G894 Chronic pain syndrome: Secondary | ICD-10-CM | POA: Diagnosis not present

## 2019-08-19 DIAGNOSIS — S134XXS Sprain of ligaments of cervical spine, sequela: Secondary | ICD-10-CM | POA: Diagnosis not present

## 2019-08-25 ENCOUNTER — Inpatient Hospital Stay: Payer: Medicare HMO | Attending: Hematology & Oncology

## 2019-08-25 ENCOUNTER — Inpatient Hospital Stay (HOSPITAL_BASED_OUTPATIENT_CLINIC_OR_DEPARTMENT_OTHER): Payer: Medicare HMO | Admitting: Family

## 2019-08-25 ENCOUNTER — Inpatient Hospital Stay: Payer: Medicare HMO

## 2019-08-25 ENCOUNTER — Other Ambulatory Visit: Payer: Self-pay

## 2019-08-25 VITALS — BP 116/70 | HR 94

## 2019-08-25 VITALS — BP 144/82 | HR 75 | Temp 97.9°F | Resp 18 | Wt 208.0 lb

## 2019-08-25 DIAGNOSIS — Z79899 Other long term (current) drug therapy: Secondary | ICD-10-CM | POA: Insufficient documentation

## 2019-08-25 DIAGNOSIS — R110 Nausea: Secondary | ICD-10-CM | POA: Insufficient documentation

## 2019-08-25 DIAGNOSIS — Z7902 Long term (current) use of antithrombotics/antiplatelets: Secondary | ICD-10-CM | POA: Diagnosis not present

## 2019-08-25 DIAGNOSIS — E785 Hyperlipidemia, unspecified: Secondary | ICD-10-CM | POA: Diagnosis not present

## 2019-08-25 DIAGNOSIS — I639 Cerebral infarction, unspecified: Secondary | ICD-10-CM | POA: Diagnosis not present

## 2019-08-25 DIAGNOSIS — R0902 Hypoxemia: Secondary | ICD-10-CM | POA: Diagnosis not present

## 2019-08-25 DIAGNOSIS — D751 Secondary polycythemia: Secondary | ICD-10-CM | POA: Diagnosis not present

## 2019-08-25 DIAGNOSIS — R51 Headache: Secondary | ICD-10-CM | POA: Diagnosis not present

## 2019-08-25 DIAGNOSIS — Z9981 Dependence on supplemental oxygen: Secondary | ICD-10-CM | POA: Diagnosis not present

## 2019-08-25 DIAGNOSIS — D500 Iron deficiency anemia secondary to blood loss (chronic): Secondary | ICD-10-CM

## 2019-08-25 DIAGNOSIS — D45 Polycythemia vera: Secondary | ICD-10-CM

## 2019-08-25 DIAGNOSIS — R0602 Shortness of breath: Secondary | ICD-10-CM | POA: Diagnosis not present

## 2019-08-25 DIAGNOSIS — R5383 Other fatigue: Secondary | ICD-10-CM | POA: Diagnosis not present

## 2019-08-25 DIAGNOSIS — J449 Chronic obstructive pulmonary disease, unspecified: Secondary | ICD-10-CM | POA: Diagnosis not present

## 2019-08-25 DIAGNOSIS — R42 Dizziness and giddiness: Secondary | ICD-10-CM | POA: Insufficient documentation

## 2019-08-25 LAB — CBC WITH DIFFERENTIAL (CANCER CENTER ONLY)
Abs Immature Granulocytes: 0.01 10*3/uL (ref 0.00–0.07)
Basophils Absolute: 0 10*3/uL (ref 0.0–0.1)
Basophils Relative: 1 %
Eosinophils Absolute: 0.1 10*3/uL (ref 0.0–0.5)
Eosinophils Relative: 2 %
HCT: 38 % (ref 36.0–46.0)
Hemoglobin: 11.3 g/dL — ABNORMAL LOW (ref 12.0–15.0)
Immature Granulocytes: 0 %
Lymphocytes Relative: 26 %
Lymphs Abs: 1.3 10*3/uL (ref 0.7–4.0)
MCH: 22 pg — ABNORMAL LOW (ref 26.0–34.0)
MCHC: 29.7 g/dL — ABNORMAL LOW (ref 30.0–36.0)
MCV: 74.1 fL — ABNORMAL LOW (ref 80.0–100.0)
Monocytes Absolute: 0.5 10*3/uL (ref 0.1–1.0)
Monocytes Relative: 9 %
Neutro Abs: 2.9 10*3/uL (ref 1.7–7.7)
Neutrophils Relative %: 62 %
Platelet Count: 254 10*3/uL (ref 150–400)
RBC: 5.13 MIL/uL — ABNORMAL HIGH (ref 3.87–5.11)
RDW: 18 % — ABNORMAL HIGH (ref 11.5–15.5)
WBC Count: 4.8 10*3/uL (ref 4.0–10.5)
nRBC: 0 % (ref 0.0–0.2)

## 2019-08-25 LAB — CMP (CANCER CENTER ONLY)
ALT: 15 U/L (ref 0–44)
AST: 22 U/L (ref 15–41)
Albumin: 3.9 g/dL (ref 3.5–5.0)
Alkaline Phosphatase: 62 U/L (ref 38–126)
Anion gap: 5 (ref 5–15)
BUN: 15 mg/dL (ref 8–23)
CO2: 27 mmol/L (ref 22–32)
Calcium: 9.3 mg/dL (ref 8.9–10.3)
Chloride: 107 mmol/L (ref 98–111)
Creatinine: 0.8 mg/dL (ref 0.44–1.00)
GFR, Est AFR Am: >60 mL/min (ref >60)
GFR, Estimated: >60 mL/min (ref >60)
Glucose, Bld: 108 mg/dL — ABNORMAL HIGH (ref 70–99)
Potassium: 4.6 mmol/L (ref 3.5–5.1)
Sodium: 139 mmol/L (ref 135–145)
Total Bilirubin: 0.4 mg/dL (ref 0.3–1.2)
Total Protein: 6.5 g/dL (ref 6.5–8.1)

## 2019-08-25 MED ORDER — SODIUM CHLORIDE 0.9 % IV SOLN
Freq: Once | INTRAVENOUS | Status: AC
  Administered 2019-08-25: 14:00:00 via INTRAVENOUS
  Filled 2019-08-25: qty 250

## 2019-08-25 NOTE — Progress Notes (Signed)
Natalie Hendrix presents today for phlebotomy per MD orders. Phlebotomy procedure started at 1408 and ended at 1425. 255 cc removed via 20 G needle at R lateral forearm.Replacement fluids given through the same IV site. Patient tolerated procedure well. IV needle removed intact.

## 2019-08-25 NOTE — Progress Notes (Signed)
Hematology and Oncology Follow Up Visit  Natalie Hendrix [id number] 16-Jun-1951 68 y.o. 08/25/2019   Principle Diagnosis:  Secondary polycythemia vera- JAK2 negative  Current Therapy:   Phlebotomy to maintain hematocrit less than 38% Plavix 75 mg by mouth daily   Interim History: Natalie Hendrix is here today for follow-up and phlebotomy. She is having fatigue, increased frequency of headaches and episodes of dizziness. She has also noted red cheeks, sweating and brain fog.  Hct today is 38%. She states that she does feel that she needs a phlebotomy.  She has noted that her allergies have been worse. She has a lot of waxy buildup in her ears. Her SOB seems to be fairly stable and she will take breaks to rest as needed. She remains on supplemental O2 24 hours a day.  She has occasional episodes of nausea. No recent vomiting.  No fever, chills, cough, rash, chest pain, palpitations, abdominal pain or changes in bowel or bladder habits.  The puffiness in her ankles comes and goes. This improves when she props up her feet. She has occasional tingling in her hands felt to be due to neck and shoulder issues.  Her appetite comes and goes but she is doing her best to stay well hydrated. Her weight is stable.   ECOG Performance Status: 1 - Symptomatic but completely ambulatory  Medications:  Allergies as of 08/25/2019      Reactions   Epinephrine Palpitations   Raises heart rate   Protriptyline Hcl Other (See Comments)   Severe constipation   Tamiflu [oseltamivir Phosphate] Other (See Comments)   "like I was having a stroke"   Tizanidine Other (See Comments)   panic   Neurontin [gabapentin] Other (See Comments)   arthralgia   Other Hives, Rash   boiron acteane   Qvar [beclomethasone] Other (See Comments)   Spiriva [tiotropium Bromide Monohydrate] Rash   Tiotropium Rash   Zonegran Other (See Comments)   Mood swings   Advair Diskus [fluticasone-salmeterol] Rash   Baclofen Rash   Chlorzoxazone Rash   Lamotrigine Rash   Levofloxacin Rash   Protriptyline Rash   Ropinirole Rash   Verapamil Rash      Medication List       Accurate as of August 25, 2019  1:55 PM. If you have any questions, ask your nurse or doctor.        STOP taking these medications   beclomethasone 42 MCG/SPRAY nasal spray Commonly known as: BECONASE-AQ Stopped by: [name], NP     TAKE these medications   albuterol 108 (90 Base) MCG/ACT inhaler Commonly known as: ProAir HFA Inhale 2 puffs into the lungs every 6 (six) hours as needed for wheezing or shortness of breath.   albuterol (2.5 MG/3ML) 0.083% nebulizer solution Commonly known as: PROVENTIL Take 3 mLs (2.5 mg total) by nebulization every 6 (six) hours as needed for wheezing or shortness of breath.   azelastine 0.1 % nasal spray Commonly known as: ASTELIN Place 1 spray into both nostrils 2 (two) times daily.   benzonatate 100 MG capsule Commonly known as: TESSALON Take 1 capsule (100 mg total) by mouth every 6 (six) hours as needed for cough.   buPROPion 300 MG 24 hr tablet Commonly known as: WELLBUTRIN XL Take 1 tablet (300 mg total) by mouth daily with breakfast.   CALCIUM 1200 PO Take 1,250 mg by mouth daily.   Centrum Silver tablet Take 1 tablet by mouth daily.   clopidogrel 75 MG tablet Commonly known  as: PLAVIX Take 1 tablet (75 mg total) by mouth daily.   cyclobenzaprine 5 MG tablet Commonly known as: FLEXERIL Take 5 mg by mouth 2 (two) times daily as needed for muscle spasms. [facility]   ESTRACE VAGINAL 0.1 MG/GM vaginal cream Generic drug: estradiol Place 1 Applicatorful vaginally daily. Small amount each dose per pt   ezetimibe 10 MG tablet Commonly known as: Zetia TAKE 1 TABLET BY MOUTH DAILY.   fexofenadine 180 MG tablet Commonly known as: Allegra Allergy Take 1 tablet (180 mg total) by mouth daily.   Fish Oil 1200 MG Caps Take 1,200 mg by mouth at bedtime.   fluticasone 50  MCG/ACT nasal spray Commonly known as: FLONASE Place 2 sprays into both nostrils at bedtime.   hyoscyamine 0.125 MG Tbdp disintergrating tablet Commonly known as: ANASPAZ Place 1 tablet (0.125 mg total) under the tongue 2 (two) times daily as needed. What changed: reasons to take this   lidocaine 5 % Commonly known as: LIDODERM Place 1 patch onto the skin as needed (pain). Remove & Discard patch within 12 hours or as directed by MD   LORazepam 1 MG tablet Commonly known as: ATIVAN Take 1 tablet (1 mg total) by mouth 2 (two) times daily. May take extra 3rd dose if needed What changed:   when to take this  reasons to take this   magnesium gluconate 500 MG tablet Commonly known as: MAGONATE Take 500 mg by mouth daily as needed (constipation).   meclizine 25 MG tablet Commonly known as: ANTIVERT Take 1 tablet (25 mg total) by mouth 3 (three) times daily as needed for dizziness. 3 month supply What changed: when to take this   metoprolol succinate 50 MG 24 hr tablet Commonly known as: TOPROL-XL Take 1 tablet (50 mg total) by mouth daily with breakfast. What changed: how much to take   morphine 15 MG tablet Commonly known as: MSIR Take 15 mg by mouth every 4 (four) hours as needed for severe pain. What changed: Another medication with the same name was removed. Continue taking this medication, and follow the directions you see here. Changed by: [name], NP   Nebulizer [facility] Use as directed   OXYGEN Inhale 2.5 L/hr into the lungs continuous.   pantoprazole 40 MG tablet Commonly known as: PROTONIX TAKE 1 TABLET (40 MG TOTAL) BY MOUTH DAILY.   pravastatin 40 MG tablet Commonly known as: PRAVACHOL Take 1 tablet (40 mg total) by mouth at bedtime.   promethazine 25 MG tablet Commonly known as: PHENERGAN Take 25 mg by mouth every 8 (eight) hours as needed for nausea or vomiting.   Systane 0.4-0.3 % Soln Generic drug: Polyethyl Glycol-Propyl Glycol Apply 1 drop  to eye daily as needed (dry eyes).   TART CHERRY ADVANCED PO Take 5 mLs by mouth daily. Juice Concentrate- 1tsp daily   Zovirax 5 % Generic drug: acyclovir ointment Apply 1 application topically as needed (flair).       Allergies:  Allergies  Allergen Reactions  . Epinephrine Palpitations    Raises heart rate   . Protriptyline Hcl Other (See Comments)    Severe constipation  . Tamiflu [Oseltamivir Phosphate] Other (See Comments)    "like I was having a stroke"  . Tizanidine Other (See Comments)    panic  . Neurontin [Gabapentin] Other (See Comments)    arthralgia  . Other Hives and Rash    boiron acteane  . Qvar [Beclomethasone] Other (See Comments)  . Spiriva [Tiotropium Bromide Monohydrate]  Rash  . Tiotropium Rash  . Zonegran Other (See Comments)    Mood swings  . Advair Diskus [Fluticasone-Salmeterol] Rash  . Baclofen Rash  . Chlorzoxazone Rash  . Lamotrigine Rash  . Levofloxacin Rash  . Protriptyline Rash  . Ropinirole Rash  . Verapamil Rash    Past Medical History, Surgical history, Social history, and Family History were reviewed and updated.  Review of Systems: All other 10 point review of systems is negative.   Physical Exam:  weight is 208 lb (94.3 kg). Her tympanic temperature is 97.9 F (36.6 C). Her blood pressure is 144/82 (abnormal) and her pulse is 75. Her respiration is 18 and oxygen saturation is 100%.   Wt Readings from Last 3 Encounters:  08/25/19 208 lb (94.3 kg)  08/12/19 208 lb 12.8 oz (94.7 kg)  07/05/19 205 lb 12.8 oz (93.4 kg)    Ocular: Sclerae unicteric, pupils equal, round and reactive to light Ear-nose-throat: Oropharynx clear, dentition fair Lymphatic: No cervical or supraclavicular adenopathy Lungs no rales or rhonchi, good excursion bilaterally Heart regular rate and rhythm, no murmur appreciated Abd soft, nontender, positive bowel sounds, no liver or spleen tip palpated on exam, no fluid wave  MSK no focal spinal  tenderness, no joint edema Neuro: non-focal, well-oriented, appropriate affect Breasts: Deferred   Lab Results  Component Value Date   WBC 4.8 08/25/2019   HGB 11.3 (L) 08/25/2019   HCT 38.0 08/25/2019   MCV 74.1 (L) 08/25/2019   PLT 254 08/25/2019   Lab Results  Component Value Date   FERRITIN <4 (L) 07/05/2019   IRON 33 (L) 07/05/2019   TIBC 485 (H) 07/05/2019   UIBC 452 (H) 07/05/2019   IRONPCTSAT 7 (L) 07/05/2019   Lab Results  Component Value Date   RETICCTPCT 1.1 04/27/2015   RBC 5.13 (H) 08/25/2019   RETICCTABS 55.1 04/27/2015   No results found for: KPAFRELGTCHN, LAMBDASER, KAPLAMBRATIO No results found for: IGGSERUM, IGA, IGMSERUM No results found for: Odetta Pink, SPEI   Chemistry      Component Value Date/Time   NA 139 08/25/2019 1308   NA 147 (H) 10/08/2017 1410   NA 141 02/17/2017 1132   K 4.6 08/25/2019 1308   K 4.6 10/08/2017 1410   K 5.3 (H) 02/17/2017 1132   CL 107 08/25/2019 1308   CL 106 10/08/2017 1410   CO2 27 08/25/2019 1308   CO2 30 10/08/2017 1410   CO2 29 02/17/2017 1132   BUN 15 08/25/2019 1308   BUN 15 10/08/2017 1410   BUN 17.8 02/17/2017 1132   CREATININE 0.80 08/25/2019 1308   CREATININE 1.0 10/08/2017 1410   CREATININE 0.8 02/17/2017 1132      Component Value Date/Time   CALCIUM 9.3 08/25/2019 1308   CALCIUM 9.0 10/08/2017 1410   CALCIUM 9.3 02/17/2017 1132   ALKPHOS 62 08/25/2019 1308   ALKPHOS 59 10/08/2017 1410   ALKPHOS 79 02/17/2017 1132   AST 22 08/25/2019 1308   AST 22 02/17/2017 1132   ALT 15 08/25/2019 1308   ALT 26 10/08/2017 1410   ALT 20 02/17/2017 1132   BILITOT 0.4 08/25/2019 1308   BILITOT 0.39 02/17/2017 1132       Impression and Plan: Ms. Markes is a very pleasant 68 yo caucasian female with secondary polycythemia (JAK-2 negative) due to COPD. Hct is 38% and she is symptomatic as mentioned above.  I do not want to over extend her by taking off  too much volume. She is quite sensitive.  We will do a mini phlebotomy and remove 250 ml off followed by the same amount in IV fluid replacement.  We will plan to see her back in another month.  She will contact our office with any questions or concerns. We can certainly see her sooner if needed.   [name], NP 9/23/20201:55 PM

## 2019-08-25 NOTE — Patient Instructions (Signed)

## 2019-08-26 LAB — IRON AND TIBC
Iron: 34 ug/dL — ABNORMAL LOW (ref 41–142)
Saturation Ratios: 8 % — ABNORMAL LOW (ref 21–57)
TIBC: 441 ug/dL (ref 236–444)
UIBC: 407 ug/dL — ABNORMAL HIGH (ref 120–384)

## 2019-08-26 LAB — FERRITIN: Ferritin: <4 ng/mL — ABNORMAL LOW (ref 11–307)

## 2019-08-31 MED ORDER — ALBUTEROL SULFATE (2.5 MG/3ML) 0.083% IN NEBU: 2.5000 mg | INHALATION_SOLUTION | Freq: Four times a day (QID) | RESPIRATORY_TRACT | 2 refills | Status: DC | PRN

## 2019-08-31 MED ORDER — ALBUTEROL SULFATE HFA 108 (90 BASE) MCG/ACT IN AERS: 2.0000 | INHALATION_SPRAY | Freq: Four times a day (QID) | RESPIRATORY_TRACT | 5 refills | Status: DC | PRN

## 2019-08-31 NOTE — Telephone Encounter (Signed)
The rx's were printed and placed at CY's dictation desk with a note to return to triage.  Once signed they will need to be faxed and the pt will need to be updated.

## 2019-09-01 NOTE — Telephone Encounter (Signed)
Natalie Hendrix Dr. Bertrum Sol Nurse has Rxs and will fax. Will route to her to follow up on

## 2019-09-01 NOTE — Telephone Encounter (Signed)
Prescriptions have been signed and ready to fax. Mr Natalie Hendrix's head CT showed some mild thickening of the lining of the sinuses without evidence of blockage or acute infection.

## 2019-09-02 NOTE — Telephone Encounter (Signed)
Signed prescription has been faxed to the number provided with a confirmation on 09/01/2019 at 12:15 PM EDT. Nothing further needed at this time.

## 2019-09-17 DIAGNOSIS — M7918 Myalgia, other site: Secondary | ICD-10-CM | POA: Diagnosis not present

## 2019-09-17 DIAGNOSIS — S134XXS Sprain of ligaments of cervical spine, sequela: Secondary | ICD-10-CM | POA: Diagnosis not present

## 2019-09-17 DIAGNOSIS — S335XXS Sprain of ligaments of lumbar spine, sequela: Secondary | ICD-10-CM | POA: Diagnosis not present

## 2019-09-17 DIAGNOSIS — G894 Chronic pain syndrome: Secondary | ICD-10-CM | POA: Diagnosis not present

## 2019-09-17 DIAGNOSIS — M542 Cervicalgia: Secondary | ICD-10-CM | POA: Diagnosis not present

## 2019-09-17 DIAGNOSIS — M62838 Other muscle spasm: Secondary | ICD-10-CM | POA: Diagnosis not present

## 2019-09-22 ENCOUNTER — Inpatient Hospital Stay: Payer: Medicare HMO

## 2019-09-22 ENCOUNTER — Inpatient Hospital Stay: Payer: Medicare HMO | Admitting: Family

## 2019-09-24 DIAGNOSIS — I10 Essential (primary) hypertension: Secondary | ICD-10-CM | POA: Diagnosis not present

## 2019-09-24 DIAGNOSIS — D45 Polycythemia vera: Secondary | ICD-10-CM | POA: Diagnosis not present

## 2019-09-24 DIAGNOSIS — R42 Dizziness and giddiness: Secondary | ICD-10-CM | POA: Diagnosis not present

## 2019-09-24 DIAGNOSIS — R0902 Hypoxemia: Secondary | ICD-10-CM | POA: Diagnosis not present

## 2019-09-24 DIAGNOSIS — M25571 Pain in right ankle and joints of right foot: Secondary | ICD-10-CM | POA: Diagnosis not present

## 2019-09-24 DIAGNOSIS — I639 Cerebral infarction, unspecified: Secondary | ICD-10-CM | POA: Diagnosis not present

## 2019-09-24 DIAGNOSIS — E785 Hyperlipidemia, unspecified: Secondary | ICD-10-CM | POA: Diagnosis not present

## 2019-09-29 ENCOUNTER — Inpatient Hospital Stay: Payer: Medicare HMO

## 2019-09-29 ENCOUNTER — Inpatient Hospital Stay: Payer: Medicare HMO | Admitting: Family

## 2019-10-02 ENCOUNTER — Other Ambulatory Visit: Payer: Self-pay | Admitting: Hematology & Oncology

## 2019-10-02 DIAGNOSIS — D45 Polycythemia vera: Secondary | ICD-10-CM

## 2019-10-02 DIAGNOSIS — Z8673 Personal history of transient ischemic attack (TIA), and cerebral infarction without residual deficits: Secondary | ICD-10-CM

## 2019-10-05 ENCOUNTER — Telehealth: Payer: Self-pay | Admitting: Family

## 2019-10-05 ENCOUNTER — Other Ambulatory Visit: Payer: Self-pay

## 2019-10-05 ENCOUNTER — Inpatient Hospital Stay: Payer: Medicare HMO | Attending: Hematology & Oncology

## 2019-10-05 ENCOUNTER — Inpatient Hospital Stay: Payer: Medicare HMO

## 2019-10-05 ENCOUNTER — Inpatient Hospital Stay (HOSPITAL_BASED_OUTPATIENT_CLINIC_OR_DEPARTMENT_OTHER): Payer: Medicare HMO | Admitting: Family

## 2019-10-05 VITALS — BP 152/60 | HR 79 | Temp 97.1°F | Resp 18 | Wt 207.0 lb

## 2019-10-05 VITALS — BP 131/72 | HR 71

## 2019-10-05 DIAGNOSIS — D45 Polycythemia vera: Secondary | ICD-10-CM

## 2019-10-05 DIAGNOSIS — D751 Secondary polycythemia: Secondary | ICD-10-CM

## 2019-10-05 DIAGNOSIS — D500 Iron deficiency anemia secondary to blood loss (chronic): Secondary | ICD-10-CM | POA: Diagnosis not present

## 2019-10-05 LAB — CBC WITH DIFFERENTIAL (CANCER CENTER ONLY)
Abs Immature Granulocytes: 0.01 10*3/uL (ref 0.00–0.07)
Basophils Absolute: 0.1 10*3/uL (ref 0.0–0.1)
Basophils Relative: 1 %
Eosinophils Absolute: 0.1 10*3/uL (ref 0.0–0.5)
Eosinophils Relative: 2 %
HCT: 40.7 % (ref 36.0–46.0)
Hemoglobin: 12 g/dL (ref 12.0–15.0)
Immature Granulocytes: 0 %
Lymphocytes Relative: 26 %
Lymphs Abs: 1.5 10*3/uL (ref 0.7–4.0)
MCH: 21.9 pg — ABNORMAL LOW (ref 26.0–34.0)
MCHC: 29.5 g/dL — ABNORMAL LOW (ref 30.0–36.0)
MCV: 74.4 fL — ABNORMAL LOW (ref 80.0–100.0)
Monocytes Absolute: 0.5 10*3/uL (ref 0.1–1.0)
Monocytes Relative: 9 %
Neutro Abs: 3.5 10*3/uL (ref 1.7–7.7)
Neutrophils Relative %: 62 %
Platelet Count: 263 10*3/uL (ref 150–400)
RBC: 5.47 MIL/uL — ABNORMAL HIGH (ref 3.87–5.11)
RDW: 18.6 % — ABNORMAL HIGH (ref 11.5–15.5)
WBC Count: 5.6 10*3/uL (ref 4.0–10.5)
nRBC: 0 % (ref 0.0–0.2)

## 2019-10-05 LAB — CMP (CANCER CENTER ONLY)
ALT: 18 U/L (ref 0–44)
AST: 22 U/L (ref 15–41)
Albumin: 4.3 g/dL (ref 3.5–5.0)
Alkaline Phosphatase: 62 U/L (ref 38–126)
Anion gap: 6 (ref 5–15)
BUN: 14 mg/dL (ref 8–23)
CO2: 29 mmol/L (ref 22–32)
Calcium: 9.2 mg/dL (ref 8.9–10.3)
Chloride: 106 mmol/L (ref 98–111)
Creatinine: 0.81 mg/dL (ref 0.44–1.00)
GFR, Est AFR Am: >60 mL/min (ref >60)
GFR, Estimated: >60 mL/min (ref >60)
Glucose, Bld: 95 mg/dL (ref 70–99)
Potassium: 4.2 mmol/L (ref 3.5–5.1)
Sodium: 141 mmol/L (ref 135–145)
Total Bilirubin: 0.4 mg/dL (ref 0.3–1.2)
Total Protein: 6.5 g/dL (ref 6.5–8.1)

## 2019-10-05 MED ORDER — SODIUM CHLORIDE 0.9 % IV SOLN
Freq: Once | INTRAVENOUS | Status: AC
  Administered 2019-10-05: 15:00:00 via INTRAVENOUS
  Filled 2019-10-05: qty 250

## 2019-10-05 NOTE — Progress Notes (Signed)
Natalie Hendrix presents today for phlebotomy per MD orders. Phlebotomy procedure started at 1435 and ended at 1450. 540 cc removed via 20 G needle at R lateral forearm. IV replacement fluids given after phlebotomy. Patient tolerated procedure well. IV needle removed intact.

## 2019-10-05 NOTE — Telephone Encounter (Signed)
Appointments scheduled calendar printed per 1/13 los 

## 2019-10-05 NOTE — Progress Notes (Signed)
Hematology and Oncology Follow Up Visit  Natalie Hendrix [id number] 20-Aug-1951 68 y.o. 10/05/2019   Principle Diagnosis:  Secondary polycythemia vera- JAK2 negative  Current Therapy:   Phlebotomy to maintain hematocrit less than 38% Plavix 75 mg by mouth daily   Interim History:  Natalie Hendrix is here today for follow-up and phlebotomy. Hct is up to 40.7 and she is symptomatic with fatigue, ruddy complexion, and headaches that have been more intense when they occur.  She has had vertigo associated with ear infection and states that she is currently on Amoxicillin.  She has sinus congestion that she states is common for this time of year.  SOB is stable on supplemental O2.  No fever, chills, n/v, cough, rash, chest pain, palpitations, abdominal pain or changes in bowel or bladder habits.  No bleeding, bruising or petechiae.  No swelling, numbness or tingling in her extremities at this time.  She is using her rolling walker with seat when ambulating for added support.  She has been eating a lot of apples and states that this has caused her to have some bloating. She is hydrating well. Her weight is stable.  She is enjoying working outside in the yard and is excited to be having her porch redone.   ECOG Performance Status: 1 - Symptomatic but completely ambulatory  Medications:  Allergies as of 10/05/2019      Reactions   Epinephrine Palpitations   Raises heart rate   Protriptyline Hcl Other (See Comments)   Severe constipation   Tamiflu [oseltamivir Phosphate] Other (See Comments)   "like I was having a stroke"   Tizanidine Other (See Comments)   panic   Neurontin [gabapentin] Other (See Comments)   arthralgia   Other Hives, Rash   boiron acteane   Qvar [beclomethasone] Other (See Comments)   Spiriva [tiotropium Bromide Monohydrate] Rash   Tiotropium Rash   Zonegran Other (See Comments)   Mood swings   Advair Diskus [fluticasone-salmeterol] Rash   Baclofen Rash   Chlorzoxazone Rash   Lamotrigine Rash   Levofloxacin Rash   Protriptyline Rash   Ropinirole Rash   Verapamil Rash      Medication List       Accurate as of October 05, 2019  1:49 PM. If you have any questions, ask your nurse or doctor.        albuterol 108 (90 Base) MCG/ACT inhaler Commonly known as: ProAir HFA Inhale 2 puffs into the lungs every 6 (six) hours as needed for wheezing or shortness of breath.   albuterol (2.5 MG/3ML) 0.083% nebulizer solution Commonly known as: PROVENTIL Take 3 mLs (2.5 mg total) by nebulization every 6 (six) hours as needed for wheezing or shortness of breath.   azelastine 0.1 % nasal spray Commonly known as: ASTELIN Place 1 spray into both nostrils 2 (two) times daily.   benzonatate 100 MG capsule Commonly known as: TESSALON Take 1 capsule (100 mg total) by mouth every 6 (six) hours as needed for cough.   buPROPion 300 MG 24 hr tablet Commonly known as: WELLBUTRIN XL Take 1 tablet (300 mg total) by mouth daily with breakfast.   CALCIUM 1200 PO Take 1,250 mg by mouth daily.   Centrum Silver tablet Take 1 tablet by mouth daily.   clopidogrel 75 MG tablet Commonly known as: PLAVIX TAKE 1 TABLET BY MOUTH EVERY DAY   cyclobenzaprine 5 MG tablet Commonly known as: FLEXERIL Take 5 mg by mouth 2 (two) times daily as needed for muscle spasms.  [place]   ESTRACE VAGINAL 0.1 MG/GM vaginal cream Generic drug: estradiol Place 1 Applicatorful vaginally daily. Small amount each dose per pt   ezetimibe 10 MG tablet Commonly known as: Zetia TAKE 1 TABLET BY MOUTH DAILY.   fexofenadine 180 MG tablet Commonly known as: Allegra Allergy Take 1 tablet (180 mg total) by mouth daily.   Fish Oil 1200 MG Caps Take 1,200 mg by mouth at bedtime.   fluticasone 50 MCG/ACT nasal spray Commonly known as: FLONASE Place 2 sprays into both nostrils at bedtime.   hyoscyamine 0.125 MG Tbdp disintergrating tablet Commonly known as: ANASPAZ Place 1  tablet (0.125 mg total) under the tongue 2 (two) times daily as needed. What changed: reasons to take this   lidocaine 5 % Commonly known as: LIDODERM Place 1 patch onto the skin as needed (pain). Remove & Discard patch within 12 hours or as directed by MD   LORazepam 1 MG tablet Commonly known as: ATIVAN Take 1 tablet (1 mg total) by mouth 2 (two) times daily. May take extra 3rd dose if needed What changed:   when to take this  reasons to take this   magnesium gluconate 500 MG tablet Commonly known as: MAGONATE Take 500 mg by mouth daily as needed (constipation).   meclizine 25 MG tablet Commonly known as: ANTIVERT Take 1 tablet (25 mg total) by mouth 3 (three) times daily as needed for dizziness. 3 month supply What changed: when to take this   metoprolol succinate 50 MG 24 hr tablet Commonly known as: TOPROL-XL Take 1 tablet (50 mg total) by mouth daily with breakfast. What changed: how much to take   morphine 15 MG tablet Commonly known as: MSIR Take 15 mg by mouth every 4 (four) hours as needed for severe pain.   Nebulizer [facility]i Use as directed   OXYGEN Inhale 2.5 L/hr into the lungs continuous.   pantoprazole 40 MG tablet Commonly known as: PROTONIX TAKE 1 TABLET (40 MG TOTAL) BY MOUTH DAILY.   pravastatin 40 MG tablet Commonly known as: PRAVACHOL Take 1 tablet (40 mg total) by mouth at bedtime.   promethazine 25 MG tablet Commonly known as: PHENERGAN Take 25 mg by mouth every 8 (eight) hours as needed for nausea or vomiting.   Systane 0.4-0.3 % Soln Generic drug: Polyethyl Glycol-Propyl Glycol Apply 1 drop to eye daily as needed (dry eyes).   TART CHERRY ADVANCED PO Take 5 mLs by mouth daily. Juice Concentrate- 1tsp daily   Zovirax 5 % Generic drug: acyclovir ointment Apply 1 application topically as needed (flair).       Allergies:  Allergies  Allergen Reactions  . Epinephrine Palpitations    Raises heart rate   . Protriptyline Hcl Other  (See Comments)    Severe constipation  . Tamiflu [Oseltamivir Phosphate] Other (See Comments)    "like I was having a stroke"  . Tizanidine Other (See Comments)    panic  . Neurontin [Gabapentin] Other (See Comments)    arthralgia  . Other Hives and Rash    boiron acteane  . Qvar [Beclomethasone] Other (See Comments)  . Spiriva [Tiotropium Bromide Monohydrate] Rash  . Tiotropium Rash  . Zonegran Other (See Comments)    Mood swings  . Advair Diskus [Fluticasone-Salmeterol] Rash  . Baclofen Rash  . Chlorzoxazone Rash  . Lamotrigine Rash  . Levofloxacin Rash  . Protriptyline Rash  . Ropinirole Rash  . Verapamil Rash    Past Medical History, Surgical history, Social history, and  Family History were reviewed and updated.  Review of Systems: All other 10 point review of systems is negative.   Physical Exam:  weight is 207 lb (93.9 kg). Her temporal temperature is 97.1 F (36.2 C) (abnormal). Her blood pressure is 152/60 (abnormal) and her pulse is 79. Her respiration is 18 and oxygen saturation is 98%.   Wt Readings from Last 3 Encounters:  10/05/19 207 lb (93.9 kg)  08/25/19 208 lb (94.3 kg)  08/12/19 208 lb 12.8 oz (94.7 kg)    Ocular: Sclerae unicteric, pupils equal, round and reactive to light Ear-nose-throat: Oropharynx clear, dentition fair Lymphatic: No cervical or supraclavicular adenopathy Lungs no rales or rhonchi, good excursion bilaterally Heart regular rate and rhythm, no murmur appreciated Abd soft, nontender, positive bowel sounds, no liver or spleen tip palpated on exam, no fluid wave  MSK no focal spinal tenderness, no joint edema Neuro: non-focal, well-oriented, appropriate affect Breasts: Deferred   Lab Results  Component Value Date   WBC 5.6 10/05/2019   HGB 12.0 10/05/2019   HCT 40.7 10/05/2019   MCV 74.4 (L) 10/05/2019   PLT 263 10/05/2019   Lab Results  Component Value Date   FERRITIN <4 (L) 08/25/2019   IRON 34 (L) 08/25/2019   TIBC 441  08/25/2019   UIBC 407 (H) 08/25/2019   IRONPCTSAT 8 (L) 08/25/2019   Lab Results  Component Value Date   RETICCTPCT 1.1 04/27/2015   RBC 5.47 (H) 10/05/2019   RETICCTABS 55.1 04/27/2015   No results found for: KPAFRELGTCHN, LAMBDASER, KAPLAMBRATIO No results found for: IGGSERUM, IGA, IGMSERUM No results found for: Odetta Pink, SPEI   Chemistry      Component Value Date/Time   NA 139 08/25/2019 1308   NA 147 (H) 10/08/2017 1410   NA 141 02/17/2017 1132   K 4.6 08/25/2019 1308   K 4.6 10/08/2017 1410   K 5.3 (H) 02/17/2017 1132   CL 107 08/25/2019 1308   CL 106 10/08/2017 1410   CO2 27 08/25/2019 1308   CO2 30 10/08/2017 1410   CO2 29 02/17/2017 1132   BUN 15 08/25/2019 1308   BUN 15 10/08/2017 1410   BUN 17.8 02/17/2017 1132   CREATININE 0.80 08/25/2019 1308   CREATININE 1.0 10/08/2017 1410   CREATININE 0.8 02/17/2017 1132      Component Value Date/Time   CALCIUM 9.3 08/25/2019 1308   CALCIUM 9.0 10/08/2017 1410   CALCIUM 9.3 02/17/2017 1132   ALKPHOS 62 08/25/2019 1308   ALKPHOS 59 10/08/2017 1410   ALKPHOS 79 02/17/2017 1132   AST 22 08/25/2019 1308   AST 22 02/17/2017 1132   ALT 15 08/25/2019 1308   ALT 26 10/08/2017 1410   ALT 20 02/17/2017 1132   BILITOT 0.4 08/25/2019 1308   BILITOT 0.39 02/17/2017 1132       Impression and Plan: Ms. Fiebelkorn is a very pleasant 68 yo caucasian female with secondary polycythemia (JAK-2 negative) due to COPD. Hct today is 40.7 and she is symptomatic as mentioned above. We will proceed with phlebotomy followed by replacement fluids.  We will plan to see her back in another month.  She will contact our office with any questions or concerns. We can certainly see her sooner if needed.   [name], NP 11/3/20201:49 PM

## 2019-10-05 NOTE — Patient Instructions (Signed)

## 2019-10-06 LAB — IRON AND TIBC
Iron: 23 ug/dL — ABNORMAL LOW (ref 41–142)
Saturation Ratios: 5 % — ABNORMAL LOW (ref 21–57)
TIBC: 498 ug/dL — ABNORMAL HIGH (ref 236–444)
UIBC: 474 ug/dL — ABNORMAL HIGH (ref 120–384)

## 2019-10-06 LAB — FERRITIN: Ferritin: <4 ng/mL — ABNORMAL LOW (ref 11–307)

## 2019-10-11 ENCOUNTER — Other Ambulatory Visit: Payer: Self-pay

## 2019-10-11 ENCOUNTER — Ambulatory Visit
Admission: RE | Admit: 2019-10-11 | Discharge: 2019-10-11 | Disposition: A | Discharge status: Home / Self Care | Payer: Medicare HMO | Source: Ambulatory Visit | Attending: Anesthesiology | Admitting: Anesthesiology

## 2019-10-11 DIAGNOSIS — M5124 Other intervertebral disc displacement, thoracic region: Secondary | ICD-10-CM | POA: Diagnosis not present

## 2019-10-11 DIAGNOSIS — M546 Pain in thoracic spine: Secondary | ICD-10-CM | POA: Diagnosis not present

## 2019-10-25 DIAGNOSIS — D45 Polycythemia vera: Secondary | ICD-10-CM | POA: Diagnosis not present

## 2019-10-25 DIAGNOSIS — R0902 Hypoxemia: Secondary | ICD-10-CM | POA: Diagnosis not present

## 2019-10-25 DIAGNOSIS — I639 Cerebral infarction, unspecified: Secondary | ICD-10-CM | POA: Diagnosis not present

## 2019-10-25 DIAGNOSIS — E785 Hyperlipidemia, unspecified: Secondary | ICD-10-CM | POA: Diagnosis not present

## 2019-11-05 ENCOUNTER — Inpatient Hospital Stay: Payer: Medicare HMO

## 2019-11-05 ENCOUNTER — Inpatient Hospital Stay: Payer: Medicare HMO | Admitting: Family

## 2019-11-08 ENCOUNTER — Ambulatory Visit: Payer: Medicare HMO | Admitting: Family

## 2019-11-08 ENCOUNTER — Other Ambulatory Visit: Payer: Medicare HMO

## 2019-11-12 ENCOUNTER — Other Ambulatory Visit: Payer: Self-pay

## 2019-11-12 ENCOUNTER — Encounter: Payer: Self-pay | Admitting: Family

## 2019-11-12 ENCOUNTER — Inpatient Hospital Stay (HOSPITAL_BASED_OUTPATIENT_CLINIC_OR_DEPARTMENT_OTHER): Payer: Medicare HMO | Admitting: Family

## 2019-11-12 ENCOUNTER — Inpatient Hospital Stay: Payer: Medicare HMO | Attending: Hematology & Oncology

## 2019-11-12 ENCOUNTER — Inpatient Hospital Stay: Payer: Medicare HMO

## 2019-11-12 VITALS — BP 123/65 | HR 82 | Temp 97.8°F | Resp 20 | Ht 67.0 in | Wt 207.1 lb

## 2019-11-12 DIAGNOSIS — Z7902 Long term (current) use of antithrombotics/antiplatelets: Secondary | ICD-10-CM | POA: Diagnosis not present

## 2019-11-12 DIAGNOSIS — D751 Secondary polycythemia: Secondary | ICD-10-CM | POA: Diagnosis not present

## 2019-11-12 DIAGNOSIS — E611 Iron deficiency: Secondary | ICD-10-CM | POA: Diagnosis not present

## 2019-11-12 DIAGNOSIS — M25571 Pain in right ankle and joints of right foot: Secondary | ICD-10-CM | POA: Diagnosis not present

## 2019-11-12 DIAGNOSIS — R42 Dizziness and giddiness: Secondary | ICD-10-CM | POA: Diagnosis not present

## 2019-11-12 DIAGNOSIS — D500 Iron deficiency anemia secondary to blood loss (chronic): Secondary | ICD-10-CM

## 2019-11-12 DIAGNOSIS — I10 Essential (primary) hypertension: Secondary | ICD-10-CM | POA: Diagnosis not present

## 2019-11-12 DIAGNOSIS — D45 Polycythemia vera: Secondary | ICD-10-CM | POA: Diagnosis not present

## 2019-11-12 LAB — CBC WITH DIFFERENTIAL (CANCER CENTER ONLY)
Abs Immature Granulocytes: 0.01 10*3/uL (ref 0.00–0.07)
Basophils Absolute: 0 10*3/uL (ref 0.0–0.1)
Basophils Relative: 1 %
Eosinophils Absolute: 0.1 10*3/uL (ref 0.0–0.5)
Eosinophils Relative: 2 %
HCT: 36.9 % (ref 36.0–46.0)
Hemoglobin: 10.9 g/dL — ABNORMAL LOW (ref 12.0–15.0)
Immature Granulocytes: 0 %
Lymphocytes Relative: 30 %
Lymphs Abs: 1.4 10*3/uL (ref 0.7–4.0)
MCH: 21.9 pg — ABNORMAL LOW (ref 26.0–34.0)
MCHC: 29.5 g/dL — ABNORMAL LOW (ref 30.0–36.0)
MCV: 74.2 fL — ABNORMAL LOW (ref 80.0–100.0)
Monocytes Absolute: 0.6 10*3/uL (ref 0.1–1.0)
Monocytes Relative: 12 %
Neutro Abs: 2.6 10*3/uL (ref 1.7–7.7)
Neutrophils Relative %: 55 %
Platelet Count: 255 10*3/uL (ref 150–400)
RBC: 4.97 MIL/uL (ref 3.87–5.11)
RDW: 16.2 % — ABNORMAL HIGH (ref 11.5–15.5)
WBC Count: 4.7 10*3/uL (ref 4.0–10.5)
nRBC: 0 % (ref 0.0–0.2)

## 2019-11-12 LAB — CMP (CANCER CENTER ONLY)
ALT: 16 U/L (ref 0–44)
AST: 21 U/L (ref 15–41)
Albumin: 4.3 g/dL (ref 3.5–5.0)
Alkaline Phosphatase: 52 U/L (ref 38–126)
Anion gap: 7 (ref 5–15)
BUN: 14 mg/dL (ref 8–23)
CO2: 27 mmol/L (ref 22–32)
Calcium: 9 mg/dL (ref 8.9–10.3)
Chloride: 108 mmol/L (ref 98–111)
Creatinine: 0.82 mg/dL (ref 0.44–1.00)
GFR, Est AFR Am: >60 mL/min (ref >60)
GFR, Estimated: >60 mL/min (ref >60)
Glucose, Bld: 109 mg/dL — ABNORMAL HIGH (ref 70–99)
Potassium: 4.4 mmol/L (ref 3.5–5.1)
Sodium: 142 mmol/L (ref 135–145)
Total Bilirubin: 0.4 mg/dL (ref 0.3–1.2)
Total Protein: 6.5 g/dL (ref 6.5–8.1)

## 2019-11-12 LAB — IRON AND TIBC
Iron: 24 ug/dL — ABNORMAL LOW (ref 41–142)
Saturation Ratios: 5 % — ABNORMAL LOW (ref 21–57)
TIBC: 490 ug/dL — ABNORMAL HIGH (ref 236–444)
UIBC: 466 ug/dL — ABNORMAL HIGH (ref 120–384)

## 2019-11-12 LAB — FERRITIN: Ferritin: <4 ng/mL — ABNORMAL LOW (ref 11–307)

## 2019-11-12 NOTE — Progress Notes (Signed)
Hematology and Oncology Follow Up Visit  Natalie Hendrix [id number] Dec 31, 1950 68 y.o. 11/12/2019   Principle Diagnosis:  Secondary polycythemia vera- JAK2 negative  Current Therapy:   Phlebotomy to maintain hematocrit less than 38% Plavix 75 mg by mouth daily   Interim History:  Natalie Hendrix is here today for follow-up. She is doing well but still having migraines off and on. She sees neurology again next week on Friday for an injection.  She has chronic back pain due to degenerative disc disease in the thoracic spine.  Hct today is stable at 36.8 She did have a fall on November 25 while moving a rug. She had some bruising on the right side that has since healed. She is still a little sore. No syncopal episodes to report.  No fever, chills, n/v, cough, rash, dizziness, chest pain, abdominal pain or changes in bowel or bladder habits.  Her SOB is well controlled on supplemental O2 as needed.  She has occasional palpitations with over exertion.  The numbness and tingling in her feet comes and goes with activity.  She takes a break to rest when needed.  Her appetite comes and goes but she is staying well hydrated. Her weight is stable at 207 lbs.   ECOG Performance Status: 1 - Symptomatic but completely ambulatory  Medications:  Allergies as of 11/12/2019      Reactions   Epinephrine Palpitations   Raises heart rate   Protriptyline Hcl Other (See Comments)   Severe constipation   Tamiflu [oseltamivir Phosphate] Other (See Comments)   "like I was having a stroke"   Tizanidine Other (See Comments)   panic   Neurontin [gabapentin] Other (See Comments)   arthralgia   Other Hives, Rash   boiron acteane   Qvar [beclomethasone] Other (See Comments)   Spiriva [tiotropium Bromide Monohydrate] Rash   Tiotropium Rash   Zonegran Other (See Comments)   Mood swings   Advair Diskus [fluticasone-salmeterol] Rash   Baclofen Rash   Chlorzoxazone Rash   Lamotrigine Rash   Levofloxacin  Rash   Protriptyline Rash   Ropinirole Rash   Verapamil Rash      Medication List       Accurate as of November 12, 2019 10:39 AM. If you have any questions, ask your nurse or doctor.        albuterol 108 (90 Base) MCG/ACT inhaler Commonly known as: ProAir HFA Inhale 2 puffs into the lungs every 6 (six) hours as needed for wheezing or shortness of breath.   albuterol (2.5 MG/3ML) 0.083% nebulizer solution Commonly known as: PROVENTIL Take 3 mLs (2.5 mg total) by nebulization every 6 (six) hours as needed for wheezing or shortness of breath.   azelastine 0.1 % nasal spray Commonly known as: ASTELIN Place 1 spray into both nostrils 2 (two) times daily.   benzonatate 100 MG capsule Commonly known as: TESSALON Take 1 capsule (100 mg total) by mouth every 6 (six) hours as needed for cough.   buPROPion 300 MG 24 hr tablet Commonly known as: WELLBUTRIN XL Take 1 tablet (300 mg total) by mouth daily with breakfast.   CALCIUM 1200 PO Take 1,250 mg by mouth daily.   Centrum Silver tablet Take 1 tablet by mouth daily.   clopidogrel 75 MG tablet Commonly known as: PLAVIX TAKE 1 TABLET BY MOUTH EVERY DAY   cyclobenzaprine 5 MG tablet Commonly known as: FLEXERIL Take 5 mg by mouth 2 (two) times daily as needed for muscle spasms. Natalie Hendrix  ESTRACE VAGINAL 0.1 MG/GM vaginal cream Generic drug: estradiol Place 1 Applicatorful vaginally daily. Small amount each dose per pt   ezetimibe 10 MG tablet Commonly known as: Zetia TAKE 1 TABLET BY MOUTH DAILY.   fexofenadine 180 MG tablet Commonly known as: Allegra Allergy Take 1 tablet (180 mg total) by mouth daily.   Fish Oil 1200 MG Caps Take 1,200 mg by mouth at bedtime.   hyoscyamine 0.125 MG Tbdp disintergrating tablet Commonly known as: ANASPAZ Place 1 tablet (0.125 mg total) under the tongue 2 (two) times daily as needed. What changed: reasons to take this   lidocaine 5 % Commonly known as: LIDODERM Place 1 patch  onto the skin as needed (pain). Remove & Discard patch within 12 hours or as directed by MD   LORazepam 1 MG tablet Commonly known as: ATIVAN Take 1 tablet (1 mg total) by mouth 2 (two) times daily. May take extra 3rd dose if needed What changed:   when to take this  reasons to take this   magnesium gluconate 500 MG tablet Commonly known as: MAGONATE Take 500 mg by mouth daily as needed (constipation).   meclizine 25 MG tablet Commonly known as: ANTIVERT Take 1 tablet (25 mg total) by mouth 3 (three) times daily as needed for dizziness. 3 month supply What changed: when to take this   metoprolol succinate 50 MG 24 hr tablet Commonly known as: TOPROL-XL Take 1 tablet (50 mg total) by mouth daily with breakfast. What changed: how much to take   morphine 15 MG tablet Commonly known as: MSIR Take 15 mg by mouth every 4 (four) hours as needed for severe pain.   Nebulizer Devi Use as directed   OXYGEN Inhale 2.5 L/hr into the lungs continuous.   pantoprazole 40 MG tablet Commonly known as: PROTONIX TAKE 1 TABLET (40 MG TOTAL) BY MOUTH DAILY.   pravastatin 40 MG tablet Commonly known as: PRAVACHOL Take 1 tablet (40 mg total) by mouth at bedtime.   promethazine 25 MG tablet Commonly known as: PHENERGAN Take 25 mg by mouth every 8 (eight) hours as needed for nausea or vomiting.   Systane 0.4-0.3 % Soln Generic drug: Polyethyl Glycol-Propyl Glycol Apply 1 drop to eye daily as needed (dry eyes).   TART CHERRY ADVANCED PO Take 5 mLs by mouth daily. Juice Concentrate- 1tsp daily   Zovirax 5 % Generic drug: acyclovir ointment Apply 1 application topically as needed (flair).       Allergies:  Allergies  Allergen Reactions  . Epinephrine Palpitations    Raises heart rate   . Protriptyline Hcl Other (See Comments)    Severe constipation  . Tamiflu [Oseltamivir Phosphate] Other (See Comments)    "like I was having a stroke"  . Tizanidine Other (See Comments)     panic  . Neurontin [Gabapentin] Other (See Comments)    arthralgia  . Other Hives and Rash    boiron acteane  . Qvar [Beclomethasone] Other (See Comments)  . Spiriva [Tiotropium Bromide Monohydrate] Rash  . Tiotropium Rash  . Zonegran Other (See Comments)    Mood swings  . Advair Diskus [Fluticasone-Salmeterol] Rash  . Baclofen Rash  . Chlorzoxazone Rash  . Lamotrigine Rash  . Levofloxacin Rash  . Protriptyline Rash  . Ropinirole Rash  . Verapamil Rash    Past Medical History, Surgical history, Social history, and Family History were reviewed and updated.  Review of Systems: All other 10 point review of systems is negative.   Physical  Exam:  vitals were not taken for this visit.   Wt Readings from Last 3 Encounters:  10/05/19 207 lb (93.9 kg)  08/25/19 208 lb (94.3 kg)  08/12/19 208 lb 12.8 oz (94.7 kg)    Ocular: Sclerae unicteric, pupils equal, round and reactive to light Ear-nose-throat: Oropharynx clear, dentition fair Lymphatic: No cervical or supraclavicular adenopathy Lungs no rales or rhonchi, good excursion bilaterally Heart regular rate and rhythm, no murmur appreciated Abd soft, nontender, positive bowel sounds, no liver or spleen tip palpated on exam, no fluid wave  MSK no focal spinal tenderness, no joint edema Neuro: non-focal, well-oriented, appropriate affect Breasts: Deferred   Lab Results  Component Value Date   WBC 4.7 11/12/2019   HGB 10.9 (L) 11/12/2019   HCT 36.9 11/12/2019   MCV 74.2 (L) 11/12/2019   PLT 255 11/12/2019   Lab Results  Component Value Date   FERRITIN <4 (L) 10/05/2019   IRON 23 (L) 10/05/2019   TIBC 498 (H) 10/05/2019   UIBC 474 (H) 10/05/2019   IRONPCTSAT 5 (L) 10/05/2019   Lab Results  Component Value Date   RETICCTPCT 1.1 04/27/2015   RBC 4.97 11/12/2019   RETICCTABS 55.1 04/27/2015   No results found for: KPAFRELGTCHN, LAMBDASER, KAPLAMBRATIO No results found for: Kandis Cocking, IGMSERUM No results found  for: Odetta Pink, SPEI   Chemistry      Component Value Date/Time   NA 141 10/05/2019 1323   NA 147 (H) 10/08/2017 1410   NA 141 02/17/2017 1132   K 4.2 10/05/2019 1323   K 4.6 10/08/2017 1410   K 5.3 (H) 02/17/2017 1132   CL 106 10/05/2019 1323   CL 106 10/08/2017 1410   CO2 29 10/05/2019 1323   CO2 30 10/08/2017 1410   CO2 29 02/17/2017 1132   BUN 14 10/05/2019 1323   BUN 15 10/08/2017 1410   BUN 17.8 02/17/2017 1132   CREATININE 0.81 10/05/2019 1323   CREATININE 1.0 10/08/2017 1410   CREATININE 0.8 02/17/2017 1132      Component Value Date/Time   CALCIUM 9.2 10/05/2019 1323   CALCIUM 9.0 10/08/2017 1410   CALCIUM 9.3 02/17/2017 1132   ALKPHOS 62 10/05/2019 1323   ALKPHOS 59 10/08/2017 1410   ALKPHOS 79 02/17/2017 1132   AST 22 10/05/2019 1323   AST 22 02/17/2017 1132   ALT 18 10/05/2019 1323   ALT 26 10/08/2017 1410   ALT 20 02/17/2017 1132   BILITOT 0.4 10/05/2019 1323   BILITOT 0.39 02/17/2017 1132       Impression and Plan: Ms. Blauer is a very pleasant 68 yo caucasian female with secondary polycythemia (JAK-2 negative) due to COPD. Hct is stable at 36.9%. no phlebotomy needed this visit.  We will see her back in another 1 month. She will contact our office with any questions or concerns. We can certainly see her sooner if needed.   [name], NP 12/11/202010:39 AM

## 2019-11-19 DIAGNOSIS — S335XXS Sprain of ligaments of lumbar spine, sequela: Secondary | ICD-10-CM | POA: Diagnosis not present

## 2019-11-19 DIAGNOSIS — S134XXS Sprain of ligaments of cervical spine, sequela: Secondary | ICD-10-CM | POA: Diagnosis not present

## 2019-11-19 DIAGNOSIS — M791 Myalgia, unspecified site: Secondary | ICD-10-CM | POA: Diagnosis not present

## 2019-11-19 DIAGNOSIS — G894 Chronic pain syndrome: Secondary | ICD-10-CM | POA: Diagnosis not present

## 2019-11-24 DIAGNOSIS — I639 Cerebral infarction, unspecified: Secondary | ICD-10-CM | POA: Diagnosis not present

## 2019-11-24 DIAGNOSIS — R0902 Hypoxemia: Secondary | ICD-10-CM | POA: Diagnosis not present

## 2019-11-24 DIAGNOSIS — D45 Polycythemia vera: Secondary | ICD-10-CM | POA: Diagnosis not present

## 2019-11-24 DIAGNOSIS — E785 Hyperlipidemia, unspecified: Secondary | ICD-10-CM | POA: Diagnosis not present

## 2019-12-03 HISTORY — PX: TRANSTHORACIC ECHOCARDIOGRAM: SHX275

## 2019-12-06 ENCOUNTER — Other Ambulatory Visit: Payer: Self-pay | Admitting: Internal Medicine

## 2019-12-06 MED ORDER — AZELASTINE HCL 0.1 % NA SOLN: 1.0000 | Freq: Two times a day (BID) | NASAL | 3 refills | Status: DC

## 2019-12-06 MED ORDER — MONTELUKAST SODIUM 10 MG PO TABS: ORAL_TABLET | ORAL | 3 refills | Status: DC

## 2019-12-06 MED ORDER — BECLOMETHASONE DIPROP MONOHYD 42 MCG/SPRAY NA SUSP: NASAL | 3 refills | Status: DC

## 2019-12-06 NOTE — Progress Notes (Signed)
Refills for singulair, beconase AQ and astelin were sent to [place] and requested

## 2019-12-08 ENCOUNTER — Other Ambulatory Visit: Payer: Self-pay

## 2019-12-08 ENCOUNTER — Ambulatory Visit (HOSPITAL_COMMUNITY): Payer: Medicare HMO | Attending: Cardiovascular Disease

## 2019-12-08 DIAGNOSIS — D45 Polycythemia vera: Secondary | ICD-10-CM | POA: Diagnosis present

## 2019-12-08 DIAGNOSIS — I119 Hypertensive heart disease without heart failure: Secondary | ICD-10-CM | POA: Insufficient documentation

## 2019-12-08 DIAGNOSIS — E785 Hyperlipidemia, unspecified: Secondary | ICD-10-CM | POA: Insufficient documentation

## 2019-12-08 DIAGNOSIS — I340 Nonrheumatic mitral (valve) insufficiency: Secondary | ICD-10-CM | POA: Diagnosis not present

## 2019-12-08 DIAGNOSIS — J9611 Chronic respiratory failure with hypoxia: Secondary | ICD-10-CM | POA: Insufficient documentation

## 2019-12-10 ENCOUNTER — Inpatient Hospital Stay: Payer: Medicare HMO

## 2019-12-10 ENCOUNTER — Inpatient Hospital Stay: Payer: Medicare HMO | Admitting: Family

## 2019-12-14 DIAGNOSIS — R69 Illness, unspecified: Secondary | ICD-10-CM | POA: Diagnosis not present

## 2019-12-21 ENCOUNTER — Other Ambulatory Visit: Payer: Self-pay

## 2019-12-21 ENCOUNTER — Encounter: Payer: Self-pay | Admitting: Family

## 2019-12-21 ENCOUNTER — Inpatient Hospital Stay: Payer: Medicare HMO | Attending: Hematology & Oncology

## 2019-12-21 ENCOUNTER — Inpatient Hospital Stay: Payer: Medicare HMO

## 2019-12-21 ENCOUNTER — Inpatient Hospital Stay (HOSPITAL_BASED_OUTPATIENT_CLINIC_OR_DEPARTMENT_OTHER): Payer: Medicare HMO | Admitting: Family

## 2019-12-21 VITALS — BP 122/68 | HR 80 | Temp 96.8°F | Resp 18 | Ht 67.0 in | Wt 206.8 lb

## 2019-12-21 VITALS — BP 151/69 | HR 75

## 2019-12-21 DIAGNOSIS — I10 Essential (primary) hypertension: Secondary | ICD-10-CM | POA: Diagnosis not present

## 2019-12-21 DIAGNOSIS — D751 Secondary polycythemia: Secondary | ICD-10-CM | POA: Diagnosis not present

## 2019-12-21 DIAGNOSIS — D500 Iron deficiency anemia secondary to blood loss (chronic): Secondary | ICD-10-CM

## 2019-12-21 DIAGNOSIS — D45 Polycythemia vera: Secondary | ICD-10-CM

## 2019-12-21 LAB — CMP (CANCER CENTER ONLY)
ALT: 19 U/L (ref 0–44)
AST: 30 U/L (ref 15–41)
Albumin: 4.1 g/dL (ref 3.5–5.0)
Alkaline Phosphatase: 65 U/L (ref 38–126)
Anion gap: 8 (ref 5–15)
BUN: 15 mg/dL (ref 8–23)
CO2: 26 mmol/L (ref 22–32)
Calcium: 8.9 mg/dL (ref 8.9–10.3)
Chloride: 105 mmol/L (ref 98–111)
Creatinine: 0.83 mg/dL (ref 0.44–1.00)
GFR, Est AFR Am: >60 mL/min (ref >60)
GFR, Estimated: >60 mL/min (ref >60)
Glucose, Bld: 100 mg/dL — ABNORMAL HIGH (ref 70–99)
Potassium: 4.5 mmol/L (ref 3.5–5.1)
Sodium: 139 mmol/L (ref 135–145)
Total Bilirubin: 0.1 mg/dL — ABNORMAL LOW (ref 0.3–1.2)
Total Protein: 7 g/dL (ref 6.5–8.1)

## 2019-12-21 LAB — CBC WITH DIFFERENTIAL (CANCER CENTER ONLY)
Abs Immature Granulocytes: 0.01 10*3/uL (ref 0.00–0.07)
Basophils Absolute: 0 10*3/uL (ref 0.0–0.1)
Basophils Relative: 1 %
Eosinophils Absolute: 0.1 10*3/uL (ref 0.0–0.5)
Eosinophils Relative: 2 %
HCT: 40.8 % (ref 36.0–46.0)
Hemoglobin: 12.1 g/dL (ref 12.0–15.0)
Immature Granulocytes: 0 %
Lymphocytes Relative: 29 %
Lymphs Abs: 1.5 10*3/uL (ref 0.7–4.0)
MCH: 21.9 pg — ABNORMAL LOW (ref 26.0–34.0)
MCHC: 29.7 g/dL — ABNORMAL LOW (ref 30.0–36.0)
MCV: 73.8 fL — ABNORMAL LOW (ref 80.0–100.0)
Monocytes Absolute: 0.4 10*3/uL (ref 0.1–1.0)
Monocytes Relative: 8 %
Neutro Abs: 3.1 10*3/uL (ref 1.7–7.7)
Neutrophils Relative %: 60 %
Platelet Count: 299 10*3/uL (ref 150–400)
RBC: 5.53 MIL/uL — ABNORMAL HIGH (ref 3.87–5.11)
RDW: 16.9 % — ABNORMAL HIGH (ref 11.5–15.5)
WBC Count: 5.1 10*3/uL (ref 4.0–10.5)
nRBC: 0 % (ref 0.0–0.2)

## 2019-12-21 MED ORDER — SODIUM CHLORIDE 0.9 % IV SOLN
Freq: Once | INTRAVENOUS | Status: AC
  Filled 2019-12-21: qty 250

## 2019-12-21 NOTE — Patient Instructions (Signed)

## 2019-12-21 NOTE — Progress Notes (Signed)
Hematology and Oncology Follow Up Visit  Natalie Hendrix [id number] 07-Oct-1951 69 y.o. 12/21/2019   Principle Diagnosis:  Secondary polycythemia vera- JAK2 negative  Current Therapy:   Phlebotomy to maintain hematocrit less than 38% Plavix 75 mg by mouth daily   Interim History:  Natalie Hendrix is here today for follow-up and phlebotomy. She is symptomatic with fatigue, headaches, dizziness and brain fog.  Hct today is 40.8%.  Her SOB is stable and she is using her supplemental O2 as needed.  No fever, chills, n/v, cough, rash, chest pain, palpitations, abdominal pain or changes in bowel or bladder habits.  No swelling, numbness or tingling in her extremities.  She does have generalized aches and pains that come and go.  No falls or syncopal episodes to report.   No episodes of blood loss. No bruising or petechiae.  Her appetite comes and goes and she is doing her best to hydrate properly each day.   ECOG Performance Status: 1 - Symptomatic but completely ambulatory  Medications:  Allergies as of 12/21/2019      Reactions   Epinephrine Palpitations   Raises heart rate   Protriptyline Hcl Other (See Comments)   Severe constipation   Tamiflu [oseltamivir Phosphate] Other (See Comments)   "like I was having a stroke"   Tizanidine Other (See Comments)   panic   Neurontin [gabapentin] Other (See Comments)   arthralgia   Other Hives, Rash   boiron acteane   Qvar [beclomethasone] Other (See Comments)   Spiriva [tiotropium Bromide Monohydrate] Rash   Tiotropium Rash   Zonegran Other (See Comments)   Mood swings   Advair Diskus [fluticasone-salmeterol] Rash   Baclofen Rash   Chlorzoxazone Rash   Lamotrigine Rash   Levofloxacin Rash   Protriptyline Rash   Ropinirole Rash   Verapamil Rash      Medication List       Accurate as of December 21, 2019  1:30 PM. If you have any questions, ask your nurse or doctor.        albuterol 108 (90 Base) MCG/ACT inhaler Commonly known  as: ProAir HFA Inhale 2 puffs into the lungs every 6 (six) hours as needed for wheezing or shortness of breath.   albuterol (2.5 MG/3ML) 0.083% nebulizer solution Commonly known as: PROVENTIL Take 3 mLs (2.5 mg total) by nebulization every 6 (six) hours as needed for wheezing or shortness of breath.   azelastine 0.1 % nasal spray Commonly known as: ASTELIN Place 1 spray into both nostrils 2 (two) times daily.   beclomethasone 42 MCG/SPRAY nasal spray Commonly known as: BECONASE-AQ 2 puffs each nostril once daily   benzonatate 100 MG capsule Commonly known as: TESSALON Take 1 capsule (100 mg total) by mouth every 6 (six) hours as needed for cough.   buPROPion 300 MG 24 hr tablet Commonly known as: WELLBUTRIN XL Take 1 tablet (300 mg total) by mouth daily with breakfast.   CALCIUM 1200 PO Take 1,250 mg by mouth daily.   Centrum Silver tablet Take 1 tablet by mouth daily.   clopidogrel 75 MG tablet Commonly known as: PLAVIX TAKE 1 TABLET BY MOUTH EVERY DAY   cyclobenzaprine 5 MG tablet Commonly known as: FLEXERIL Take 5 mg by mouth 2 (two) times daily as needed for muscle spasms. Phillips   ESTRACE VAGINAL 0.1 MG/GM vaginal cream Generic drug: estradiol Place 1 Applicatorful vaginally daily. Small amount each dose per pt   ezetimibe 10 MG tablet Commonly known as: Zetia TAKE 1 TABLET  BY MOUTH DAILY.   fexofenadine 180 MG tablet Commonly known as: Allegra Allergy Take 1 tablet (180 mg total) by mouth daily.   Fish Oil 1200 MG Caps Take 1,200 mg by mouth at bedtime.   hyoscyamine 0.125 MG Tbdp disintergrating tablet Commonly known as: ANASPAZ Place 1 tablet (0.125 mg total) under the tongue 2 (two) times daily as needed. What changed: reasons to take this   lidocaine 5 % Commonly known as: LIDODERM Place 1 patch onto the skin as needed (pain). Remove & Discard patch within 12 hours or as directed by MD   LORazepam 1 MG tablet Commonly known as: ATIVAN Take 1  tablet (1 mg total) by mouth 2 (two) times daily. May take extra 3rd dose if needed What changed:   when to take this  reasons to take this   magnesium gluconate 500 MG tablet Commonly known as: MAGONATE Take 500 mg by mouth daily as needed (constipation).   meclizine 25 MG tablet Commonly known as: ANTIVERT Take 1 tablet (25 mg total) by mouth 3 (three) times daily as needed for dizziness. 3 month supply What changed: when to take this   metoprolol succinate 50 MG 24 hr tablet Commonly known as: TOPROL-XL Take 1 tablet (50 mg total) by mouth daily with breakfast. What changed: how much to take   montelukast 10 MG tablet Commonly known as: SINGULAIR 1 daily   morphine 15 MG tablet Commonly known as: MSIR Take 15 mg by mouth every 4 (four) hours as needed for severe pain.   Nebulizer Devi Use as directed   OXYGEN Inhale 2.5 L/hr into the lungs continuous.   pantoprazole 40 MG tablet Commonly known as: PROTONIX TAKE 1 TABLET (40 MG TOTAL) BY MOUTH DAILY.   pravastatin 40 MG tablet Commonly known as: PRAVACHOL Take 1 tablet (40 mg total) by mouth at bedtime.   promethazine 25 MG tablet Commonly known as: PHENERGAN Take 25 mg by mouth every 8 (eight) hours as needed for nausea or vomiting.   Systane 0.4-0.3 % Soln Generic drug: Polyethyl Glycol-Propyl Glycol Apply 1 drop to eye daily as needed (dry eyes).   TART CHERRY ADVANCED PO Take 5 mLs by mouth daily. Juice Concentrate- 1tsp daily   Zovirax 5 % Generic drug: acyclovir ointment Apply 1 application topically as needed (flair).       Allergies:  Allergies  Allergen Reactions  . Epinephrine Palpitations    Raises heart rate   . Protriptyline Hcl Other (See Comments)    Severe constipation  . Tamiflu [Oseltamivir Phosphate] Other (See Comments)    "like I was having a stroke"  . Tizanidine Other (See Comments)    panic  . Neurontin [Gabapentin] Other (See Comments)    arthralgia  . Other Hives  and Rash    boiron acteane  . Qvar [Beclomethasone] Other (See Comments)  . Spiriva [Tiotropium Bromide Monohydrate] Rash  . Tiotropium Rash  . Zonegran Other (See Comments)    Mood swings  . Advair Diskus [Fluticasone-Salmeterol] Rash  . Baclofen Rash  . Chlorzoxazone Rash  . Lamotrigine Rash  . Levofloxacin Rash  . Protriptyline Rash  . Ropinirole Rash  . Verapamil Rash    Past Medical History, Surgical history, Social history, and Family History were reviewed and updated.  Review of Systems: All other 10 point review of systems is negative.   Physical Exam:  vitals were not taken for this visit.   Wt Readings from Last 3 Encounters:  11/12/19 207 lb  1.9 oz (93.9 kg)  10/05/19 207 lb (93.9 kg)  08/25/19 208 lb (94.3 kg)    Ocular: Sclerae unicteric, pupils equal, round and reactive to light Ear-nose-throat: Oropharynx clear, dentition fair Lymphatic: No cervical or supraclavicular adenopathy Lungs no rales or rhonchi, good excursion bilaterally Heart regular rate and rhythm, no murmur appreciated Abd soft, nontender, positive bowel sounds, no liver or spleen tip palpated on exam, no fluid wave  MSK no focal spinal tenderness, no joint edema Neuro: non-focal, well-oriented, appropriate affect Breasts: Deferred   Lab Results  Component Value Date   WBC 5.1 12/21/2019   HGB 12.1 12/21/2019   HCT 40.8 12/21/2019   MCV 73.8 (L) 12/21/2019   PLT 299 12/21/2019   Lab Results  Component Value Date   FERRITIN <4 (L) 11/12/2019   IRON 24 (L) 11/12/2019   TIBC 490 (H) 11/12/2019   UIBC 466 (H) 11/12/2019   IRONPCTSAT 5 (L) 11/12/2019   Lab Results  Component Value Date   RETICCTPCT 1.1 04/27/2015   RBC 5.53 (H) 12/21/2019   RETICCTABS 55.1 04/27/2015   No results found for: KPAFRELGTCHN, LAMBDASER, KAPLAMBRATIO No results found for: IGGSERUM, IGA, IGMSERUM No results found for: Odetta Pink, SPEI    Chemistry      Component Value Date/Time   NA 142 11/12/2019 1023   NA 147 (H) 10/08/2017 1410   NA 141 02/17/2017 1132   K 4.4 11/12/2019 1023   K 4.6 10/08/2017 1410   K 5.3 (H) 02/17/2017 1132   CL 108 11/12/2019 1023   CL 106 10/08/2017 1410   CO2 27 11/12/2019 1023   CO2 30 10/08/2017 1410   CO2 29 02/17/2017 1132   BUN 14 11/12/2019 1023   BUN 15 10/08/2017 1410   BUN 17.8 02/17/2017 1132   CREATININE 0.82 11/12/2019 1023   CREATININE 1.0 10/08/2017 1410   CREATININE 0.8 02/17/2017 1132      Component Value Date/Time   CALCIUM 9.0 11/12/2019 1023   CALCIUM 9.0 10/08/2017 1410   CALCIUM 9.3 02/17/2017 1132   ALKPHOS 52 11/12/2019 1023   ALKPHOS 59 10/08/2017 1410   ALKPHOS 79 02/17/2017 1132   AST 21 11/12/2019 1023   AST 22 02/17/2017 1132   ALT 16 11/12/2019 1023   ALT 26 10/08/2017 1410   ALT 20 02/17/2017 1132   BILITOT 0.4 11/12/2019 1023   BILITOT 0.39 02/17/2017 1132       Impression and Plan: Ms. Chakrabarti is a very pleasant 69 yo caucasian female with secondary polycythemia (JAK-2 negative) due to COPD. We did proceed with phlebotomy and replacement fluids for Hct 40.8%.  We will plan to see her back in another month.  She will contact our office with any questions or concerns. We can certainly see her sooner if needed.   [name], NP 1/19/20211:30 PM

## 2019-12-22 ENCOUNTER — Telehealth: Payer: Self-pay | Admitting: Family

## 2019-12-22 LAB — IRON AND TIBC
Iron: 36 ug/dL — ABNORMAL LOW (ref 41–142)
Saturation Ratios: 8 % — ABNORMAL LOW (ref 21–57)
TIBC: 459 ug/dL — ABNORMAL HIGH (ref 236–444)
UIBC: 423 ug/dL — ABNORMAL HIGH (ref 120–384)

## 2019-12-22 LAB — FERRITIN: Ferritin: <4 ng/mL — ABNORMAL LOW (ref 11–307)

## 2019-12-22 NOTE — Telephone Encounter (Signed)
Appointments scheduled patient notified by phone per  1/19 los

## 2019-12-24 ENCOUNTER — Other Ambulatory Visit: Payer: Self-pay | Admitting: Internal Medicine

## 2019-12-25 DIAGNOSIS — D45 Polycythemia vera: Secondary | ICD-10-CM | POA: Diagnosis not present

## 2019-12-25 DIAGNOSIS — R0902 Hypoxemia: Secondary | ICD-10-CM | POA: Diagnosis not present

## 2019-12-25 DIAGNOSIS — I639 Cerebral infarction, unspecified: Secondary | ICD-10-CM | POA: Diagnosis not present

## 2019-12-25 DIAGNOSIS — E785 Hyperlipidemia, unspecified: Secondary | ICD-10-CM | POA: Diagnosis not present

## 2019-12-30 ENCOUNTER — Encounter: Payer: Self-pay | Admitting: Cardiology

## 2019-12-30 ENCOUNTER — Ambulatory Visit (INDEPENDENT_AMBULATORY_CARE_PROVIDER_SITE_OTHER): Payer: Medicare HMO | Admitting: Cardiology

## 2019-12-30 ENCOUNTER — Other Ambulatory Visit: Payer: Self-pay

## 2019-12-30 VITALS — BP 140/72 | HR 80 | Ht 67.0 in | Wt 207.0 lb

## 2019-12-30 DIAGNOSIS — I10 Essential (primary) hypertension: Secondary | ICD-10-CM | POA: Diagnosis not present

## 2019-12-30 DIAGNOSIS — J9611 Chronic respiratory failure with hypoxia: Secondary | ICD-10-CM | POA: Diagnosis not present

## 2019-12-30 DIAGNOSIS — D45 Polycythemia vera: Secondary | ICD-10-CM | POA: Diagnosis not present

## 2019-12-30 DIAGNOSIS — E782 Mixed hyperlipidemia: Secondary | ICD-10-CM | POA: Diagnosis not present

## 2019-12-30 NOTE — Progress Notes (Signed)
Primary Care Provider: Hayden Rasmussen, MD Cardiologist: No primary care provider on file. Electrophysiologist: None  Clinic Note: Chief Complaint  Patient presents with  . Follow-up    Echo results   HPI:    Natalie Hendrix is a 69 y.o. female with a PMH with PMH polycythemia vera (phlebotomy dependent), hyperlipidemia with chronic oxygen dependent COPD/dyspnea, and palpitations who presents today for 67-month follow-up.  [name] was last seen on 11/17/2019 along with her husband Darrel.  She noted poor stability.  Home O2 100% the time.  Off-and-on chest discomfort.  Occasional skipped beat.  Recent Hospitalizations: None  Reviewed  CV studies:    The following studies were reviewed today: (if available, images/films reviewed: From Epic Chart or Care Everywhere) . 2D Echo 12/09/2019: Normal LV function.  EF 66 5%.  Grade 1 diastolic dysfunction.  Normal echocardiogram for age  Interval History:   GWYNNETH Hendrix returns here today overall doing relatively well.  She has not had any significant chest pain or pressure.  She still has expected exertional dyspnea and notes that without her oxygen, her levels will drop down into the 80s.  She gets short of breath doing just but anything without it, but with the oxygen she is able to do okay.  She is having some skipped beats and her husband has purchased the [employer] monitor that they have yet to set up.  These palpitations are not very frequent or long lasting.  She now has a prescription for Flonase which is helped some of her breathing. Despite having dyspnea, she denies any PND, orthopnea and trivial edema.  CV Review of Symptoms (Summary): positive for - dyspnea on exertion, irregular heartbeat and shortness of breath negative for - chest pain, edema, loss of consciousness, orthopnea, paroxysmal nocturnal dyspnea, rapid heart rate or Syncope/near syncope, TIA/amaurosis fugax, claudication  The patient does not have symptoms  concerning for COVID-19 infection (fever, chills, cough, or new shortness of breath).  The patient is practicing social distancing & Masking.    REVIEWED OF SYSTEMS   A comprehensive ROS was performed. Review of Systems  Constitutional: Positive for malaise/fatigue. Negative for weight loss.  HENT: Positive for congestion. Negative for nosebleeds.   Respiratory: Positive for cough, shortness of breath and wheezing. Negative for sputum production.   Gastrointestinal: Negative for blood in stool and melena.  Genitourinary: Negative for hematuria.  Musculoskeletal: Positive for joint pain.  Neurological: Positive for dizziness, tingling and weakness (Global). Negative for focal weakness (Worse weakness in the right leg and left).       Slow, unsteady gait.  Psychiatric/Behavioral: Negative for depression and memory loss. The patient is not nervous/anxious and does not have insomnia.   All other systems reviewed and are negative.  I have reviewed and (if needed) personally updated the patient's problem list, medications, allergies, past medical and surgical history, social and family history.   PAST MEDICAL HISTORY   Past Medical History:  Diagnosis Date  . Allergic rhinitis   . Allergy   . Anxiety   . Asthma    uses oxygen 2.5 l/m 24/7, sleeps elevated  . Complication of anesthesia    very sensitive to sedatives, B/P drops  . CVA (cerebral vascular accident) ([place]) 1999, 2014   residual left side weakness, dysphagia, word finding difficulty, and short term memory; uses mobile chair, cane  . Dependence on supplemental oxygen   . Depression   . Dizziness   . Fibromyalgia   .  Fluttering heart   . GERD (gastroesophageal reflux disease)   . Hyperlipidemia   . Hypertension   . Migraine headache   . Migraine, unspecified, not intractable, without status migrainosus   . Other amnesia   . Polycythemia rubra vera ([place])   . Radiculopathy, site unspecified   . Thoracic outlet syndrome  1981   repair  . TIA (transient ischemic attack)   . Unspecified chronic bronchitis ([place])   . Unspecified dementia without behavioral disturbance ([place])   . Vertigo     PAST SURGICAL HISTORY   Past Surgical History:  Procedure Laterality Date  . APPENDECTOMY    . BALLOON DILATION N/A 10/27/2013   Procedure: BALLOON DILATION;  Surgeon: Arta Silence, MD;  Location: [facility];  Service: Endoscopy;  Laterality: N/A;  . BREAST SURGERY  1979  . CHOLECYSTECTOMY    . CYST REMOVAL HAND Bilateral    left done 10-13-13(Dr. Gramig)  . DILATION AND CURETTAGE OF UTERUS    . ESOPHAGOGASTRODUODENOSCOPY (EGD) WITH PROPOFOL N/A 10/27/2013   Procedure: ESOPHAGOGASTRODUODENOSCOPY (EGD) WITH PROPOFOL;  Surgeon: Arta Silence, MD;  Location: [facility];  Service: Endoscopy;  Laterality: N/A;  . GALLBLADDER SURGERY    . KNEE SURGERY     repair torn ligament/capsule knee cruciat  . REDUCTION MAMMAPLASTY    . RESECTION RIB PARTIAL    . RHINOPLASTY    . Root Resection and Revascularization  1980   of Long thoracic artery  . scalenectomy    . synonectomy    . TRANSTHORACIC ECHOCARDIOGRAM  12/2019   Normal LV function.  EF 66 5%.  Grade 1 diastolic dysfunction.  Normal echocardiogram for age    MEDICATIONS/ALLERGIES   Current Meds  Medication Sig  . albuterol (PROAIR HFA) 108 (90 Base) MCG/ACT inhaler Inhale 2 puffs into the lungs every 6 (six) hours as needed for wheezing or shortness of breath.  Marland Kitchen albuterol (PROVENTIL) (2.5 MG/3ML) 0.083% nebulizer solution Take 3 mLs (2.5 mg total) by nebulization every 6 (six) hours as needed for wheezing or shortness of breath.  Marland Kitchen azelastine (ASTELIN) 0.1 % nasal spray Place 1 spray into both nostrils 2 (two) times daily.  . beclomethasone (BECONASE-AQ) 42 MCG/SPRAY nasal spray 2 puffs each nostril once daily  . benzonatate (TESSALON) 100 MG capsule Take 1 capsule (100 mg total) by mouth every 6 (six) hours as needed for cough.  Marland Kitchen buPROPion (WELLBUTRIN  XL) 300 MG 24 hr tablet Take 1 tablet (300 mg total) by mouth daily with breakfast.  . Calcium Carbonate-Vit D-Min (CALCIUM 1200 PO) Take 1,250 mg by mouth daily.   . clopidogrel (PLAVIX) 75 MG tablet TAKE 1 TABLET BY MOUTH EVERY DAY  . cyclobenzaprine (FLEXERIL) 5 MG tablet Take 5 mg by mouth 2 (two) times daily as needed for muscle spasms. [name]  . ESTRACE VAGINAL 0.1 MG/GM vaginal cream Place 1 Applicatorful vaginally daily. Small amount each dose per pt  . ezetimibe (ZETIA) 10 MG tablet TAKE 1 TABLET BY MOUTH DAILY.  . fexofenadine (ALLEGRA ALLERGY) 180 MG tablet Take 1 tablet (180 mg total) by mouth daily.  . hyoscyamine (ANASPAZ) 0.125 MG TBDP disintergrating tablet Place 1 tablet (0.125 mg total) under the tongue 2 (two) times daily as needed. (Patient taking differently: Place 0.125 mg under the tongue 2 (two) times daily as needed for bladder spasms or cramping. )  . lidocaine (LIDODERM) 5 % Place 1 patch onto the skin as needed (pain). Remove & Discard patch within 12 hours or as directed by MD  .  LORazepam (ATIVAN) 1 MG tablet Take 1 tablet (1 mg total) by mouth 2 (two) times daily. May take extra 3rd dose if needed (Patient taking differently: Take 1 mg by mouth daily as needed. May take extra 3rd dose if needed)  . magnesium gluconate (MAGONATE) 500 MG tablet Take 500 mg by mouth daily as needed (constipation).   . meclizine (ANTIVERT) 25 MG tablet Take 1 tablet (25 mg total) by mouth 3 (three) times daily as needed for dizziness. 3 month supply (Patient taking differently: Take 25 mg by mouth 3 (three) times daily. 3 month supply)  . metoprolol succinate (TOPROL-XL) 50 MG 24 hr tablet Take 1 tablet (50 mg total) by mouth daily with breakfast. (Patient taking differently: Take 75 mg by mouth daily with breakfast. )  . Misc Natural Products (TART CHERRY ADVANCED PO) Take 5 mLs by mouth daily. Juice Concentrate- 1tsp daily  . montelukast (SINGULAIR) 10 MG tablet 1 daily  . morphine  (MSIR) 15 MG tablet Take 15 mg by mouth every 4 (four) hours as needed for severe pain.  . Multiple Vitamins-Minerals (CENTRUM SILVER) tablet Take 1 tablet by mouth daily.  . Omega-3 Fatty Acids (FISH OIL) 1200 MG CAPS Take 1,200 mg by mouth at bedtime.   . OXYGEN Inhale 2.5 L/hr into the lungs continuous.  . pantoprazole (PROTONIX) 40 MG tablet TAKE 1 TABLET (40 MG TOTAL) BY MOUTH DAILY.  Vladimir Faster Glycol-Propyl Glycol (SYSTANE) 0.4-0.3 % SOLN Apply 1 drop to eye daily as needed (dry eyes).   . pravastatin (PRAVACHOL) 40 MG tablet Take 1 tablet (40 mg total) by mouth at bedtime.  . promethazine (PHENERGAN) 25 MG tablet Take 25 mg by mouth every 8 (eight) hours as needed for nausea or vomiting.   Marland Kitchen Respiratory Therapy Supplies (NEBULIZER) DEVI Use as directed  . ZOVIRAX 5 % Apply 1 application topically as needed (flair).     Allergies  Allergen Reactions  . Epinephrine Palpitations    Raises heart rate   . Protriptyline Hcl Other (See Comments)    Severe constipation  . Tamiflu [Oseltamivir Phosphate] Other (See Comments)    "like I was having a stroke"  . Tizanidine Other (See Comments)    panic  . Neurontin [Gabapentin] Other (See Comments)    arthralgia  . Other Hives and Rash    boiron acteane  . Qvar [Beclomethasone] Other (See Comments)  . Spiriva [Tiotropium Bromide Monohydrate] Rash  . Tiotropium Rash  . Zonegran Other (See Comments)    Mood swings  . Flonase [Fluticasone]   . Advair Diskus [Fluticasone-Salmeterol] Rash  . Baclofen Rash  . Chlorzoxazone Rash  . Lamotrigine Rash  . Levofloxacin Rash  . Protriptyline Rash  . Ropinirole Rash  . Verapamil Rash    SOCIAL HISTORY/FAMILY HISTORY   Social History   Tobacco Use  . Smoking status: Never Smoker  . Smokeless tobacco: Never Used  . Tobacco comment: never used tobacco; secondary smoke exposure  Substance Use Topics  . Alcohol use: Not Currently    Alcohol/week: 0.0 standard drinks    Comment:  seldom, wine @ christmas  . Drug use: No   Social History   Social History Narrative   Lives at home with husband  Engineer, technical sales) and service dog   Right handed   Caffeine: tries to avoid     Family History family history includes Allergies in an other family member; Cancer in her mother; Cervical cancer in her mother; Clotting disorder in her mother; Emphysema in  her father and another family member; Heart attack in an other family member; Heart disease in her father and mother; Hypertension in her mother and another family member; Irregular heart beat in her mother; Lung cancer in her father; Other in her father and other family members; Rheum arthritis in an other family member; Skin cancer in her father; Stroke in some other family members.  OBJCTIVE -PE, EKG, labs   Wt Readings from Last 3 Encounters:  12/30/19 207 lb (93.9 kg)  12/21/19 206 lb 12.8 oz (93.8 kg)  11/12/19 207 lb 1.9 oz (93.9 kg)    Physical Exam: BP 140/72   Pulse 80   Ht 5\' 7"  (1.702 m)   Wt 207 lb (93.9 kg)   SpO2 100%   BMI 32.42 kg/m  Physical Exam  Constitutional: She is oriented to person, place, and time. No distress.  Chronically ill woman.  No acute distress  HENT:  Head: Normocephalic and atraumatic.  Neck: No hepatojugular reflux and no JVD present. Carotid bruit is not present.  Cardiovascular: Normal rate, regular rhythm, S1 normal, S2 normal and intact distal pulses.  Occasional extrasystoles are present. PMI is not displaced. Exam reveals distant heart sounds. Exam reveals no gallop and no friction rub.  No murmur heard. Pulmonary/Chest: Effort normal. No respiratory distress (Has baseline accessory muscle use, no distress at rest.). She has wheezes. She has no rales. She exhibits no tenderness.  On home oxygen  Musculoskeletal:        General: No edema. Normal range of motion.     Cervical back: Normal range of motion and neck supple.  Neurological: She is alert and oriented to person,  place, and time.  Skin: She is not diaphoretic.  Psychiatric: She has a normal mood and affect. Her behavior is normal. Judgment and thought content normal.  Vitals reviewed.   Adult ECG Report N/A  Recent Labs:    Lab Results  Component Value Date   CHOL 169 10/17/2016   HDL 69.30 10/17/2016   LDLCALC 80 10/17/2016   TRIG 99.0 10/17/2016   CHOLHDL 2 10/17/2016   Lab Results  Component Value Date   CREATININE 0.83 12/21/2019   BUN 15 12/21/2019   NA 139 12/21/2019   K 4.5 12/21/2019   CL 105 12/21/2019   CO2 26 12/21/2019   Lab Results  Component Value Date   TSH 1.29 10/17/2016    ASSESSMENT/PLAN    Problem List Items Addressed This Visit    Polycythemia vera (HCC) (Chronic)    Intermittent phlebotomy.  We ordered the echo to assess for any potential LVH or other cardiomyopathy associated with this and the echo was essentially normal.  No structural normalities.  Normal EF.  No significant LVH.      HLD (hyperlipidemia) (Chronic)    She remains on Zetia plus pravastatin.  I do not have any recent labs.  These are being monitored by PCP.      Essential hypertension (Chronic)    Pretty well controlled on current meds.  She is taking Toprol now at 75 mg daily.  Does have room to go up to 100 with her current heart rate.      Chronic respiratory failure with hypoxia (HCC) - Primary (Chronic)    Permanent O2 dependency.  2D echo ordered to evaluate for any potential cardiac involvement.  Normal EF and essentially normal RV pressures.  This would argue against any cardiac component.  Grade 1 diastolic dysfunction would not explain this.  COVID-19 Education: The signs and symptoms of COVID-19 were discussed with the patient and how to seek care for testing (follow up with PCP or arrange E-visit).   The importance of social distancing was discussed today.  I spent a total of 12 minutes with the patient. >  50% of the time was spent in direct patient  consultation.  Additional time spent with chart review  / charting (studies, outside notes, etc): 5 Total Time: 17 min   Current medicines are reviewed at length with the patient today.  (+/- concerns) N/A   Patient Instructions / Medication Changes & Studies & Tests Ordered   Patient Instructions  Medication Instructions:   no changes  *If you need a refill on your cardiac medications before your next appointment, please call your pharmacy*  Lab Work: Not needed  Testing/Procedures: Not needed  Follow-Up: At [facility], you and your health needs are our priority.  As part of our continuing mission to provide you with exceptional heart care, we have created designated Provider Care Teams.  These Care Teams include your primary Cardiologist (physician) and Advanced Practice Providers (APPs -  Physician Assistants and Nurse Practitioners) who all work together to provide you with the care you need, when you need it.  Your next appointment:   12 month(s)  The format for your next appointment:   In Person  Provider:   Glenetta Hew, MD      Studies Ordered:   No orders of the defined types were placed in this encounter.    Glenetta Hew, M.D., M.S. Interventional Cardiologist   Pager # [phone] Phone # [phone] [address] [place], [place] [address]   Thank you for choosing Heartcare at [facility]!!

## 2019-12-30 NOTE — Patient Instructions (Signed)
Medication Instructions:   no changes  *If you need a refill on your cardiac medications before your next appointment, please call your pharmacy*  Lab Work: Not needed  Testing/Procedures: Not needed  Follow-Up: At [facility], you and your health needs are our priority.  As part of our continuing mission to provide you with exceptional heart care, we have created designated Provider Care Teams.  These Care Teams include your primary Cardiologist (physician) and Advanced Practice Providers (APPs -  Physician Assistants and Nurse Practitioners) who all work together to provide you with the care you need, when you need it.  Your next appointment:   12 month(s)  The format for your next appointment:   In Person  Provider:   Glenetta Hew, MD

## 2019-12-31 DIAGNOSIS — M25571 Pain in right ankle and joints of right foot: Secondary | ICD-10-CM | POA: Diagnosis not present

## 2019-12-31 DIAGNOSIS — R42 Dizziness and giddiness: Secondary | ICD-10-CM | POA: Diagnosis not present

## 2019-12-31 DIAGNOSIS — I10 Essential (primary) hypertension: Secondary | ICD-10-CM | POA: Diagnosis not present

## 2019-12-31 DIAGNOSIS — R69 Illness, unspecified: Secondary | ICD-10-CM | POA: Diagnosis not present

## 2020-01-02 ENCOUNTER — Encounter: Payer: Self-pay | Admitting: Cardiology

## 2020-01-02 NOTE — Assessment & Plan Note (Signed)
She remains on Zetia plus pravastatin.  I do not have any recent labs.  These are being monitored by PCP.

## 2020-01-02 NOTE — Assessment & Plan Note (Signed)
Intermittent phlebotomy.  We ordered the echo to assess for any potential LVH or other cardiomyopathy associated with this and the echo was essentially normal.  No structural normalities.  Normal EF.  No significant LVH.

## 2020-01-02 NOTE — Assessment & Plan Note (Signed)
Permanent O2 dependency.  2D echo ordered to evaluate for any potential cardiac involvement.  Normal EF and essentially normal RV pressures.  This would argue against any cardiac component.  Grade 1 diastolic dysfunction would not explain this.

## 2020-01-02 NOTE — Assessment & Plan Note (Signed)
Pretty well controlled on current meds.  She is taking Toprol now at 75 mg daily.  Does have room to go up to 100 with her current heart rate.

## 2020-01-21 ENCOUNTER — Inpatient Hospital Stay: Payer: Medicare HMO

## 2020-01-21 ENCOUNTER — Inpatient Hospital Stay: Payer: Medicare HMO | Admitting: Family

## 2020-01-25 DIAGNOSIS — I639 Cerebral infarction, unspecified: Secondary | ICD-10-CM | POA: Diagnosis not present

## 2020-01-25 DIAGNOSIS — D45 Polycythemia vera: Secondary | ICD-10-CM | POA: Diagnosis not present

## 2020-01-25 DIAGNOSIS — E785 Hyperlipidemia, unspecified: Secondary | ICD-10-CM | POA: Diagnosis not present

## 2020-01-25 DIAGNOSIS — R0902 Hypoxemia: Secondary | ICD-10-CM | POA: Diagnosis not present

## 2020-01-26 ENCOUNTER — Inpatient Hospital Stay (HOSPITAL_BASED_OUTPATIENT_CLINIC_OR_DEPARTMENT_OTHER): Payer: Medicare HMO | Admitting: Family

## 2020-01-26 ENCOUNTER — Other Ambulatory Visit: Payer: Self-pay

## 2020-01-26 ENCOUNTER — Inpatient Hospital Stay: Payer: Medicare HMO

## 2020-01-26 ENCOUNTER — Encounter: Payer: Self-pay | Admitting: Family

## 2020-01-26 ENCOUNTER — Inpatient Hospital Stay: Payer: Medicare HMO | Attending: Hematology & Oncology

## 2020-01-26 VITALS — BP 116/50 | HR 72 | Temp 97.4°F | Resp 19 | Ht 67.0 in | Wt 205.0 lb

## 2020-01-26 DIAGNOSIS — D500 Iron deficiency anemia secondary to blood loss (chronic): Secondary | ICD-10-CM

## 2020-01-26 DIAGNOSIS — D45 Polycythemia vera: Secondary | ICD-10-CM

## 2020-01-26 DIAGNOSIS — D751 Secondary polycythemia: Secondary | ICD-10-CM

## 2020-01-26 LAB — CBC WITH DIFFERENTIAL (CANCER CENTER ONLY)
Abs Immature Granulocytes: 0.01 10*3/uL (ref 0.00–0.07)
Basophils Absolute: 0 10*3/uL (ref 0.0–0.1)
Basophils Relative: 1 %
Eosinophils Absolute: 0.1 10*3/uL (ref 0.0–0.5)
Eosinophils Relative: 3 %
HCT: 38 % (ref 36.0–46.0)
Hemoglobin: 11.3 g/dL — ABNORMAL LOW (ref 12.0–15.0)
Immature Granulocytes: 0 %
Lymphocytes Relative: 25 %
Lymphs Abs: 1.4 10*3/uL (ref 0.7–4.0)
MCH: 22.2 pg — ABNORMAL LOW (ref 26.0–34.0)
MCHC: 29.7 g/dL — ABNORMAL LOW (ref 30.0–36.0)
MCV: 74.8 fL — ABNORMAL LOW (ref 80.0–100.0)
Monocytes Absolute: 0.5 10*3/uL (ref 0.1–1.0)
Monocytes Relative: 9 %
Neutro Abs: 3.3 10*3/uL (ref 1.7–7.7)
Neutrophils Relative %: 62 %
Platelet Count: 303 10*3/uL (ref 150–400)
RBC: 5.08 MIL/uL (ref 3.87–5.11)
RDW: 17.7 % — ABNORMAL HIGH (ref 11.5–15.5)
WBC Count: 5.4 10*3/uL (ref 4.0–10.5)
nRBC: 0 % (ref 0.0–0.2)

## 2020-01-26 LAB — CMP (CANCER CENTER ONLY)
ALT: 16 U/L (ref 0–44)
AST: 21 U/L (ref 15–41)
Albumin: 4.2 g/dL (ref 3.5–5.0)
Alkaline Phosphatase: 66 U/L (ref 38–126)
Anion gap: 5 (ref 5–15)
BUN: 14 mg/dL (ref 8–23)
CO2: 29 mmol/L (ref 22–32)
Calcium: 9.6 mg/dL (ref 8.9–10.3)
Chloride: 107 mmol/L (ref 98–111)
Creatinine: 0.83 mg/dL (ref 0.44–1.00)
GFR, Est AFR Am: >60 mL/min (ref >60)
GFR, Estimated: >60 mL/min (ref >60)
Glucose, Bld: 96 mg/dL (ref 70–99)
Potassium: 4.6 mmol/L (ref 3.5–5.1)
Sodium: 141 mmol/L (ref 135–145)
Total Bilirubin: 0.4 mg/dL (ref 0.3–1.2)
Total Protein: 6.8 g/dL (ref 6.5–8.1)

## 2020-01-26 MED ORDER — SODIUM CHLORIDE 0.9 % IV SOLN
Freq: Once | INTRAVENOUS | Status: AC
  Filled 2020-01-26: qty 250

## 2020-01-26 NOTE — Progress Notes (Signed)
Natalie Hendrix presents today for phlebotomy per MD orders. Phlebotomy procedure started at 1505 and ended at 1540. 300 cc removed via 20 G needle at R lateral forearm. 250 cc sodium chloride given through the same IV. Patient tolerated procedure well. IV needle removed intact.

## 2020-01-26 NOTE — Patient Instructions (Signed)
Therapeutic Phlebotomy Therapeutic phlebotomy is the planned removal of blood from a person's body for the purpose of treating a medical condition. The procedure is similar to donating blood. Usually, about a pint (470 mL, or 0.47 L) of blood is removed. The average adult has 9-12 pints (4.3-5.7 L) of blood in the body. Therapeutic phlebotomy may be used to treat the following medical conditions:  Hemochromatosis. This is a condition in which the blood contains too much iron.  Polycythemia vera. This is a condition in which the blood contains too many red blood cells.  Porphyria cutanea tarda. This is a disease in which an important part of hemoglobin is not made properly. It results in the buildup of abnormal amounts of porphyrins in the body.  Sickle cell disease. This is a condition in which the red blood cells form an abnormal crescent shape rather than a round shape. Tell a health care provider about:  Any allergies you have.  All medicines you are taking, including vitamins, herbs, eye drops, creams, and over-the-counter medicines.  Any problems you or family members have had with anesthetic medicines.  Any blood disorders you have.  Any surgeries you have had.  Any medical conditions you have.  Whether you are pregnant or may be pregnant. What are the risks? Generally, this is a safe procedure. However, problems may occur, including:  Nausea or light-headedness.  Low blood pressure (hypotension).  Soreness, bleeding, swelling, or bruising at the needle insertion site.  Infection. What happens before the procedure?  Follow instructions from your health care provider about eating or drinking restrictions.  Ask your health care provider about: ? Changing or stopping your regular medicines. This is especially important if you are taking diabetes medicines or blood thinners (anticoagulants). ? Taking medicines such as aspirin and ibuprofen. These medicines can thin your  blood. Do not take these medicines unless your health care provider tells you to take them. ? Taking over-the-counter medicines, vitamins, herbs, and supplements.  Wear clothing with sleeves that can be raised above the elbow.  Plan to have someone take you home from the hospital or clinic.  You may have a blood sample taken.  Your blood pressure, pulse rate, and breathing rate will be measured. What happens during the procedure?   To lower your risk of infection: ? Your health care team will wash or sanitize their hands. ? Your skin will be cleaned with an antiseptic.  You may be given a medicine to numb the area (local anesthetic).  A tourniquet will be placed on your arm.  A needle will be inserted into one of your veins.  Tubing and a collection bag will be attached to that needle.  Blood will flow through the needle and tubing into the collection bag.  The collection bag will be placed lower than your arm to allow gravity to help the flow of blood into the bag.  You may be asked to open and close your hand slowly and continually during the entire collection.  After the specified amount of blood has been removed from your body, the collection bag and tubing will be clamped.  The needle will be removed from your vein.  Pressure will be held on the site of the needle insertion to stop the bleeding.  A bandage (dressing) will be placed over the needle insertion site. The procedure may vary among health care providers and hospitals. What happens after the procedure?  Your blood pressure, pulse rate, and breathing rate will be   measured after the procedure.  You will be encouraged to drink fluids.  Your recovery will be assessed and monitored.  You can return to your normal activities as told by your health care provider. Summary  Therapeutic phlebotomy is the planned removal of blood from a person's body for the purpose of treating a medical condition.  Therapeutic  phlebotomy may be used to treat hemochromatosis, polycythemia vera, porphyria cutanea tarda, or sickle cell disease.  In the procedure, a needle is inserted and about a pint (470 mL, or 0.47 L) of blood is removed. The average adult has 9-12 pints (4.3-5.7 L) of blood in the body.  This is generally a safe procedure, but it can sometimes cause problems such as nausea, light-headedness, or low blood pressure (hypotension). This information is not intended to replace advice given to you by your health care provider. Make sure you discuss any questions you have with your health care provider. Document Revised: 12/04/2017 Document Reviewed: 12/04/2017 Elsevier Patient Education  2020 [employer]  

## 2020-01-27 LAB — IRON AND TIBC
Iron: 32 ug/dL — ABNORMAL LOW (ref 41–142)
Saturation Ratios: 6 % — ABNORMAL LOW (ref 21–57)
TIBC: 499 ug/dL — ABNORMAL HIGH (ref 236–444)
UIBC: 467 ug/dL — ABNORMAL HIGH (ref 120–384)

## 2020-01-27 LAB — FERRITIN: Ferritin: <4 ng/mL — ABNORMAL LOW (ref 11–307)

## 2020-01-27 NOTE — Progress Notes (Signed)
Hematology and Oncology Follow Up Visit  Natalie [place]rix [id number] [birth year] 69 y.o. 01/27/2020   Principle Diagnosis:  Secondary polycythemia vera- JAK2 negative  Current Therapy:   Phlebotomy to maintain hematocrit less than 38% Plavix 75 mg by mouth daily   Interim History:  Ms. Torson is here today for follow-up and phlebotomy. She has noted increased fatigue, headaches and dizziness. Hct at this time is 38%.  She uses her rolling walker with seat when ambulating for added support.  No swelling, tenderness, numbness or tingling in her extremities.  She did lose her balance and fall at home this morning. She states that she just tipped backwards. No evidence of trauma on exam. She is a little sore but states that she feels ok.  No other falls to report and no syncopal episodes.  No fever, chills, n/v, cough, rash, chest pain, palpitations, abdominal pain or changes in bowel or bladder habits.  She has occasional bouts of nausea, no vomiting.  No episodes of bleeding. No bruising or petechiae.  She states that she is eating well and doing her best to stay well hydrated. Her weight is stable.   ECOG Performance Status: 1 - Symptomatic but completely ambulatory  Medications:  Allergies as of 01/26/2020      Reactions   Epinephrine Palpitations   Raises heart rate   Flonase [fluticasone] Palpitations   Protriptyline Hcl Other (See Comments)   Severe constipation   Tamiflu [oseltamivir Phosphate] Other (See Comments)   "like I was having a stroke"   Tizanidine Other (See Comments)   panic   Neurontin [gabapentin] Other (See Comments)   arthralgia   Other Hives, Rash   boiron acteane   Qvar [beclomethasone] Other (See Comments)   Spiriva [tiotropium Bromide Monohydrate] Rash   Tiotropium Rash   Zonegran Other (See Comments)   Mood swings   Advair Diskus [fluticasone-salmeterol] Rash   Baclofen Rash   Chlorzoxazone Rash   Lamotrigine Rash   Levofloxacin Rash   Protriptyline Rash   Ropinirole Rash   Verapamil Rash      Medication List       Accurate as of January 26, 2020 11:59 PM. If you have any questions, ask your nurse or doctor.        albuterol 108 (90 Base) MCG/ACT inhaler Commonly known as: ProAir HFA Inhale 2 puffs into the lungs every 6 (six) hours as needed for wheezing or shortness of breath.   albuterol (2.5 MG/3ML) 0.083% nebulizer solution Commonly known as: PROVENTIL Take 3 mLs (2.5 mg total) by nebulization every 6 (six) hours as needed for wheezing or shortness of breath.   azelastine 0.1 % nasal spray Commonly known as: ASTELIN Place 1 spray into both nostrils 2 (two) times daily.   beclomethasone 42 MCG/SPRAY nasal spray Commonly known as: BECONASE-AQ 2 puffs each nostril once daily   benzonatate 100 MG capsule Commonly known as: TESSALON Take 1 capsule (100 mg total) by mouth every 6 (six) hours as needed for cough.   buPROPion 300 MG 24 hr tablet Commonly known as: WELLBUTRIN XL Take 1 tablet (300 mg total) by mouth daily with breakfast.   CALCIUM 1200 PO Take 1,250 mg by mouth daily.   Centrum Silver tablet Take 1 tablet by mouth daily.   clopidogrel 75 MG tablet Commonly known as: PLAVIX TAKE 1 TABLET BY MOUTH EVERY DAY   cyclobenzaprine 10 MG tablet Commonly known as: FLEXERIL Take 10 mg by mouth every 8 (eight) hours as needed. What  changed: Another medication with the same name was removed. Continue taking this medication, and follow the directions you see here. Changed by: [name], NP   ESTRACE VAGINAL 0.1 MG/GM vaginal cream Generic drug: estradiol Place 1 Applicatorful vaginally daily. Small amount each dose per pt   ezetimibe 10 MG tablet Commonly known as: Zetia TAKE 1 TABLET BY MOUTH DAILY.   fexofenadine 180 MG tablet Commonly known as: Allegra Allergy Take 1 tablet (180 mg total) by mouth daily.   Fish Oil 1200 MG Caps Take 1,200 mg by mouth at bedtime.     hyoscyamine 0.125 MG Tbdp disintergrating tablet Commonly known as: ANASPAZ Place 1 tablet (0.125 mg total) under the tongue 2 (two) times daily as needed. What changed: reasons to take this   lidocaine 5 % Commonly known as: LIDODERM Place 1 patch onto the skin as needed (pain). Remove & Discard patch within 12 hours or as directed by MD   LORazepam 1 MG tablet Commonly known as: ATIVAN Take 1 tablet (1 mg total) by mouth 2 (two) times daily. May take extra 3rd dose if needed What changed:   when to take this  reasons to take this   magnesium gluconate 500 MG tablet Commonly known as: MAGONATE Take 500 mg by mouth daily as needed (constipation).   meclizine 25 MG tablet Commonly known as: ANTIVERT Take 1 tablet (25 mg total) by mouth 3 (three) times daily as needed for dizziness. 3 month supply What changed: when to take this   metoprolol succinate 50 MG 24 hr tablet Commonly known as: TOPROL-XL Take 1 tablet (50 mg total) by mouth daily with breakfast. What changed: how much to take   montelukast 10 MG tablet Commonly known as: SINGULAIR 1 daily   morphine 15 MG tablet Commonly known as: MSIR Take 15 mg by mouth every 4 (four) hours as needed for severe pain.   Nebulizer Devi Use as directed   OXYGEN Inhale 2.5 L/hr into the lungs continuous.   pantoprazole 40 MG tablet Commonly known as: PROTONIX TAKE 1 TABLET (40 MG TOTAL) BY MOUTH DAILY.   pravastatin 40 MG tablet Commonly known as: PRAVACHOL Take 1 tablet (40 mg total) by mouth at bedtime.   promethazine 25 MG tablet Commonly known as: PHENERGAN Take 25 mg by mouth every 8 (eight) hours as needed for nausea or vomiting.   Systane 0.4-0.3 % Soln Generic drug: Polyethyl Glycol-Propyl Glycol Apply 1 drop to eye daily as needed (dry eyes).   TART CHERRY ADVANCED PO Take 5 mLs by mouth daily. Juice Concentrate- 1tsp daily   Zovirax 5 % Generic drug: acyclovir ointment Apply 1 application topically  as needed (flair).       Allergies:  Allergies  Allergen Reactions  . Epinephrine Palpitations    Raises heart rate   . Flonase [Fluticasone] Palpitations  . Protriptyline Hcl Other (See Comments)    Severe constipation  . Tamiflu [Oseltamivir Phosphate] Other (See Comments)    "like I was having a stroke"  . Tizanidine Other (See Comments)    panic  . Neurontin [Gabapentin] Other (See Comments)    arthralgia  . Other Hives and Rash    boiron acteane  . Qvar [Beclomethasone] Other (See Comments)  . Spiriva [Tiotropium Bromide Monohydrate] Rash  . Tiotropium Rash  . Zonegran Other (See Comments)    Mood swings  . Advair Diskus [Fluticasone-Salmeterol] Rash  . Baclofen Rash  . Chlorzoxazone Rash  . Lamotrigine Rash  . Levofloxacin Rash  .  Protriptyline Rash  . Ropinirole Rash  . Verapamil Rash    Past Medical History, Surgical history, Social history, and Family History were reviewed and updated.  Review of Systems: All other 10 point review of systems is negative.   Physical Exam:  height is 5\' 7"  (1.702 m) and weight is 205 lb (93 kg). Her temporal temperature is 97.4 F (36.3 C) (abnormal). Her blood pressure is 116/50 (abnormal) and her pulse is 72. Her respiration is 19 and oxygen saturation is 99%.   Wt Readings from Last 3 Encounters:  01/26/20 205 lb (93 kg)  12/30/19 207 lb (93.9 kg)  12/21/19 206 lb 12.8 oz (93.8 kg)    Ocular: Sclerae unicteric, pupils equal, round and reactive to light Ear-nose-throat: Oropharynx clear, dentition fair Lymphatic: No cervical or supraclavicular adenopathy Lungs no rales or rhonchi, good excursion bilaterally Heart regular rate and rhythm, no murmur appreciated Abd soft, nontender, positive bowel sounds, no liver or spleen tip palpated on exam, no fluid wave  MSK no focal spinal tenderness, no joint edema Neuro: non-focal, well-oriented, appropriate affect Breasts: Deferred   Lab Results  Component Value Date    WBC 5.4 01/26/2020   HGB 11.3 (L) 01/26/2020   HCT 38.0 01/26/2020   MCV 74.8 (L) 01/26/2020   PLT 303 01/26/2020   Lab Results  Component Value Date   FERRITIN <4 (L) 12/21/2019   IRON 36 (L) 12/21/2019   TIBC 459 (H) 12/21/2019   UIBC 423 (H) 12/21/2019   IRONPCTSAT 8 (L) 12/21/2019   Lab Results  Component Value Date   RETICCTPCT 1.1 04/27/2015   RBC 5.08 01/26/2020   RETICCTABS 55.1 04/27/2015   No results found for: KPAFRELGTCHN, LAMBDASER, KAPLAMBRATIO No results found for: IGGSERUM, IGA, IGMSERUM No results found for: Odetta Pink, SPEI   Chemistry      Component Value Date/Time   NA 141 01/26/2020 1409   NA 147 (H) 10/08/2017 1410   NA 141 02/17/2017 1132   K 4.6 01/26/2020 1409   K 4.6 10/08/2017 1410   K 5.3 (H) 02/17/2017 1132   CL 107 01/26/2020 1409   CL 106 10/08/2017 1410   CO2 29 01/26/2020 1409   CO2 30 10/08/2017 1410   CO2 29 02/17/2017 1132   BUN 14 01/26/2020 1409   BUN 15 10/08/2017 1410   BUN 17.8 02/17/2017 1132   CREATININE 0.83 01/26/2020 1409   CREATININE 1.0 10/08/2017 1410   CREATININE 0.8 02/17/2017 1132      Component Value Date/Time   CALCIUM 9.6 01/26/2020 1409   CALCIUM 9.0 10/08/2017 1410   CALCIUM 9.3 02/17/2017 1132   ALKPHOS 66 01/26/2020 1409   ALKPHOS 59 10/08/2017 1410   ALKPHOS 79 02/17/2017 1132   AST 21 01/26/2020 1409   AST 22 02/17/2017 1132   ALT 16 01/26/2020 1409   ALT 26 10/08/2017 1410   ALT 20 02/17/2017 1132   BILITOT 0.4 01/26/2020 1409   BILITOT 0.39 02/17/2017 1132       Impression and Plan: Ms. Cardelli is a very pleasant 69 yo caucasian female with secondary polycythemia (JAK-2 negative) due to COPD. She is symptomatic as mentioned above. Hct 38%.  She had a mini phlebotomy with 250 cc out and then 250 cc replacement fluids given.  We will plan to see her back in another month.  She will contact our office with any questions or concerns. We  can certainly see her sooner if needed.  [name], NP 2/25/20219:00 AM

## 2020-02-14 DIAGNOSIS — S134XXS Sprain of ligaments of cervical spine, sequela: Secondary | ICD-10-CM | POA: Diagnosis not present

## 2020-02-14 DIAGNOSIS — M791 Myalgia, unspecified site: Secondary | ICD-10-CM | POA: Diagnosis not present

## 2020-02-14 DIAGNOSIS — G894 Chronic pain syndrome: Secondary | ICD-10-CM | POA: Diagnosis not present

## 2020-02-14 DIAGNOSIS — S335XXS Sprain of ligaments of lumbar spine, sequela: Secondary | ICD-10-CM | POA: Diagnosis not present

## 2020-02-22 ENCOUNTER — Encounter: Payer: Self-pay | Admitting: Pulmonary Disease

## 2020-02-22 ENCOUNTER — Other Ambulatory Visit: Payer: Self-pay

## 2020-02-22 ENCOUNTER — Ambulatory Visit (INDEPENDENT_AMBULATORY_CARE_PROVIDER_SITE_OTHER): Payer: Medicare HMO | Admitting: Pulmonary Disease

## 2020-02-22 DIAGNOSIS — J4520 Mild intermittent asthma, uncomplicated: Secondary | ICD-10-CM | POA: Diagnosis not present

## 2020-02-22 DIAGNOSIS — J209 Acute bronchitis, unspecified: Secondary | ICD-10-CM | POA: Diagnosis not present

## 2020-02-22 DIAGNOSIS — E785 Hyperlipidemia, unspecified: Secondary | ICD-10-CM | POA: Diagnosis not present

## 2020-02-22 DIAGNOSIS — I639 Cerebral infarction, unspecified: Secondary | ICD-10-CM | POA: Diagnosis not present

## 2020-02-22 DIAGNOSIS — R0902 Hypoxemia: Secondary | ICD-10-CM | POA: Diagnosis not present

## 2020-02-22 DIAGNOSIS — J9611 Chronic respiratory failure with hypoxia: Secondary | ICD-10-CM | POA: Diagnosis not present

## 2020-02-22 DIAGNOSIS — D45 Polycythemia vera: Secondary | ICD-10-CM | POA: Diagnosis not present

## 2020-02-22 MED ORDER — AZITHROMYCIN 250 MG PO TABS: ORAL_TABLET | ORAL | 0 refills | Status: DC

## 2020-02-22 NOTE — Assessment & Plan Note (Signed)
Likely acute bronchitis today  Plan: We will treat with Z-Pak today

## 2020-02-22 NOTE — Assessment & Plan Note (Signed)
Plan: Continue oxygen therapy as prescribed 

## 2020-02-22 NOTE — Assessment & Plan Note (Signed)
Plan: Patient can start using albuterol nebulized meds every 6-8 hours as needed for shortness of breath or wheezing We will treat with Z-Pak today

## 2020-02-22 NOTE — Progress Notes (Signed)
Virtual Visit via Telephone Note  I connected with Natalie Hendrix  on 02/22/20 at 11:30 AM EDT by telephone and verified that I am speaking with the correct person using two identifiers.  Location: Patient: Home Provider: Office [facility] - [id number], [place], [place], [place] [id number]   I discussed the limitations, risks, security and privacy concerns of performing an evaluation and management service by telephone and the availability of in person appointments. I also discussed with the patient that there may be a patient responsible charge related to this service. The patient expressed understanding and agreed to proceed.  Patient consented to consult via telephone: Yes People present and their role in pt care: Pt   History of Present Illness:  69 year old female never smoker followed in our office for asthma  Past medical history: History of recurrent pneumonia, cough, thoracic outlet syndrome, chronic hypoxic respiratory failure, status post CVA, GERD, allergic rhinitis, polycythemia, depression Smoking history: Never smoker Maintenance: Patient of Dr. Annamaria Boots  Chief complaint: Cough  69 year old female completing televisit with our office today.  Patient reporting she has had 3 days of worsening cough, congestion.  Cough is productive with discolored yellow to brown mucus.  She is going to start using her nebulizer today.  She denies any fevers.  She has not had any recent antibiotics.  She reports adherence to her daily antihistamine.  She believes this is directly related to the weather.  She is concerned that she may have bronchitis.   Throughout entire conversation and telephone visit today patient is having worsened cough.  Observations/Objective:  O2 2 L sleep and prn/Advanced  Nl PFT 2012 except DLCO 62% Allergy profile was NEG, 4.9 total IgE GI/ Dr Paulita Fujita: Ba swallow> stricture and probably mild aspiration Residual right diaphragm elevation after surgery  for right thoracic outlet syndrome   Social History   Tobacco Use  Smoking Status Never Smoker  Smokeless Tobacco Never Used  Tobacco Comment   never used tobacco; secondary smoke exposure   Immunization History  Administered Date(s) Administered  . Fluad Quad(high Dose 65+) 08/12/2019  . Influenza Split 10/16/2011, 10/12/2012  . Influenza Whole 08/30/2008, 11/01/2009, 09/01/2010  . Influenza, High Dose Seasonal PF 10/26/2018  . Influenza,inj,Quad PF,6+ Mos 10/19/2013, 10/17/2016  . Influenza-Unspecified 10/06/2017  . Pneumococcal Conjugate-13 12/02/2005, 11/12/2013  . Pneumococcal Polysaccharide-23 09/22/2006  . Td 08/19/2005, 12/02/2006     Assessment and Plan:  Acute bronchitis Likely acute bronchitis today  Plan: We will treat with Z-Pak today  Asthma, mild intermittent Plan: Patient can start using albuterol nebulized meds every 6-8 hours as needed for shortness of breath or wheezing We will treat with Z-Pak today  Chronic respiratory failure with hypoxia ([place]) Plan: Continue oxygen therapy as prescribed   Follow Up Instructions:  Return in about 2 months (around 04/23/2020), or if symptoms worsen or fail to improve, for Follow up with Dr. Annamaria Boots.   I discussed the assessment and treatment plan with the patient. The patient was provided an opportunity to ask questions and all were answered. The patient agreed with the plan and demonstrated an understanding of the instructions.   The patient was advised to call back or seek an in-person evaluation if the symptoms worsen or if the condition fails to improve as anticipated.  I provided 23 minutes of non-face-to-face time during this encounter.   Lauraine Rinne, NP

## 2020-02-22 NOTE — Patient Instructions (Addendum)
You were seen today by Lauraine Rinne, NP  for:   Nice talking with you today.  We will treat you with an antibiotic called azithromycin as a Z-Pak.  Please call our office if you have any questions or concerns regarding this.  We will also get you set up for follow-up with Dr. Annamaria Boots.  Take care and stay safe,  Natalie Hendrix  1. Acute bronchitis, unspecified organism  Azithromycin 250mg  tablet  >>>Take 2 tablets (500mg  total) today, and then 1 tablet (250mg ) for the next four days  >>>take with food  >>>can also take probiotic and / or yogurt while on antibiotic   Continue oxygen therapy as prescribed  >>>maintain oxygen saturations greater than 88 percent  >>>if unable to maintain oxygen saturations please contact the office  >>>do not smoke with oxygen  >>>can use nasal saline gel or nasal saline rinses to moisturize nose if oxygen causes dryness    We recommend today:   Meds ordered this encounter  Medications  . DISCONTD: azithromycin (ZITHROMAX) 250 MG tablet    Sig: 500mg  (two tablets) today, then 250mg  (1 tablet) for the next 4 days    Dispense:  6 tablet    Refill:  0  . azithromycin (ZITHROMAX) 250 MG tablet    Sig: 500mg  (two tablets) today, then 250mg  (1 tablet) for the next 4 days    Dispense:  6 tablet    Refill:  0    Follow Up:    Return in about 2 months (around 04/23/2020), or if symptoms worsen or fail to improve, for Follow up with Dr. Annamaria Boots.   Please do your part to reduce the spread of COVID-19:      Reduce your risk of any infection  and COVID19 by using the similar precautions used for avoiding the common cold or flu:  Marland Kitchen Wash your hands often with soap and warm water for at least 20 seconds.  If soap and water are not readily available, use an alcohol-based hand sanitizer with at least 60% alcohol.  . If coughing or sneezing, cover your mouth and nose by coughing or sneezing into the elbow areas of your shirt or coat, into a tissue or into your sleeve  (not your hands). Natalie Hendrix A MASK when in public  . Avoid shaking hands with others and consider head nods or verbal greetings only. . Avoid touching your eyes, nose, or mouth with unwashed hands.  . Avoid close contact with people who are sick. . Avoid places or events with large numbers of people in one location, like concerts or sporting events. . If you have some symptoms but not all symptoms, continue to monitor at home and seek medical attention if your symptoms worsen. . If you are having a medical emergency, call 911.   [place] / e-Visit: [email or web address]         [facility]: [facility]: [id number]                   [facility]: [id number]     It is flu season:   >>> Best ways to protect herself from the flu: Receive the yearly flu vaccine, practice good hand hygiene washing with soap and also using hand sanitizer when available, eat a nutritious meals, get adequate rest, hydrate appropriately   Please contact the office if your symptoms worsen or you have concerns that you are not  improving.   Thank you for choosing [facility] for your healthcare, and for allowing [place] to partner with you on your healthcare journey. I am thankful to be able to provide care to you today.   Wyn Quaker FNP-C

## 2020-02-23 ENCOUNTER — Other Ambulatory Visit: Payer: Medicare HMO

## 2020-02-23 ENCOUNTER — Ambulatory Visit: Payer: Medicare HMO | Admitting: Family

## 2020-03-02 ENCOUNTER — Inpatient Hospital Stay: Payer: Medicare HMO

## 2020-03-02 ENCOUNTER — Other Ambulatory Visit: Payer: Self-pay

## 2020-03-02 ENCOUNTER — Other Ambulatory Visit (HOSPITAL_BASED_OUTPATIENT_CLINIC_OR_DEPARTMENT_OTHER): Payer: Self-pay | Admitting: Family

## 2020-03-02 ENCOUNTER — Telehealth: Payer: Self-pay | Admitting: Family

## 2020-03-02 ENCOUNTER — Encounter: Payer: Self-pay | Admitting: Family

## 2020-03-02 ENCOUNTER — Inpatient Hospital Stay: Payer: Medicare HMO | Attending: Hematology & Oncology

## 2020-03-02 ENCOUNTER — Ambulatory Visit (HOSPITAL_BASED_OUTPATIENT_CLINIC_OR_DEPARTMENT_OTHER)
Admission: RE | Admit: 2020-03-02 | Discharge: 2020-03-02 | Disposition: A | Discharge status: Home / Self Care | Payer: Medicare HMO | Source: Ambulatory Visit | Attending: Family | Admitting: Family

## 2020-03-02 ENCOUNTER — Inpatient Hospital Stay (HOSPITAL_BASED_OUTPATIENT_CLINIC_OR_DEPARTMENT_OTHER): Payer: Medicare HMO | Admitting: Family

## 2020-03-02 VITALS — BP 146/90 | HR 76 | Temp 98.6°F | Resp 19 | Ht 67.0 in | Wt 204.8 lb

## 2020-03-02 DIAGNOSIS — D45 Polycythemia vera: Secondary | ICD-10-CM

## 2020-03-02 DIAGNOSIS — D500 Iron deficiency anemia secondary to blood loss (chronic): Secondary | ICD-10-CM | POA: Diagnosis not present

## 2020-03-02 DIAGNOSIS — R109 Unspecified abdominal pain: Secondary | ICD-10-CM

## 2020-03-02 DIAGNOSIS — R140 Abdominal distension (gaseous): Secondary | ICD-10-CM | POA: Diagnosis not present

## 2020-03-02 DIAGNOSIS — D751 Secondary polycythemia: Secondary | ICD-10-CM | POA: Diagnosis present

## 2020-03-02 DIAGNOSIS — D509 Iron deficiency anemia, unspecified: Secondary | ICD-10-CM | POA: Insufficient documentation

## 2020-03-02 LAB — CMP (CANCER CENTER ONLY)
ALT: 16 U/L (ref 0–44)
AST: 21 U/L (ref 15–41)
Albumin: 4.1 g/dL (ref 3.5–5.0)
Alkaline Phosphatase: 58 U/L (ref 38–126)
Anion gap: 5 (ref 5–15)
BUN: 18 mg/dL (ref 8–23)
CO2: 31 mmol/L (ref 22–32)
Calcium: 9.5 mg/dL (ref 8.9–10.3)
Chloride: 105 mmol/L (ref 98–111)
Creatinine: 0.86 mg/dL (ref 0.44–1.00)
GFR, Est AFR Am: >60 mL/min (ref >60)
GFR, Estimated: >60 mL/min (ref >60)
Glucose, Bld: 99 mg/dL (ref 70–99)
Potassium: 4.4 mmol/L (ref 3.5–5.1)
Sodium: 141 mmol/L (ref 135–145)
Total Bilirubin: 0.4 mg/dL (ref 0.3–1.2)
Total Protein: 6.6 g/dL (ref 6.5–8.1)

## 2020-03-02 LAB — CBC WITH DIFFERENTIAL (CANCER CENTER ONLY)
Abs Immature Granulocytes: 0.01 10*3/uL (ref 0.00–0.07)
Basophils Absolute: 0 10*3/uL (ref 0.0–0.1)
Basophils Relative: 1 %
Eosinophils Absolute: 0.1 10*3/uL (ref 0.0–0.5)
Eosinophils Relative: 3 %
HCT: 38.6 % (ref 36.0–46.0)
Hemoglobin: 11.3 g/dL — ABNORMAL LOW (ref 12.0–15.0)
Immature Granulocytes: 0 %
Lymphocytes Relative: 30 %
Lymphs Abs: 1.3 10*3/uL (ref 0.7–4.0)
MCH: 22.2 pg — ABNORMAL LOW (ref 26.0–34.0)
MCHC: 29.3 g/dL — ABNORMAL LOW (ref 30.0–36.0)
MCV: 75.8 fL — ABNORMAL LOW (ref 80.0–100.0)
Monocytes Absolute: 0.4 10*3/uL (ref 0.1–1.0)
Monocytes Relative: 9 %
Neutro Abs: 2.5 10*3/uL (ref 1.7–7.7)
Neutrophils Relative %: 57 %
Platelet Count: 274 10*3/uL (ref 150–400)
RBC: 5.09 MIL/uL (ref 3.87–5.11)
RDW: 16.6 % — ABNORMAL HIGH (ref 11.5–15.5)
WBC Count: 4.4 10*3/uL (ref 4.0–10.5)
nRBC: 0 % (ref 0.0–0.2)

## 2020-03-02 MED ORDER — SODIUM CHLORIDE 0.9 % IV SOLN
Freq: Once | INTRAVENOUS | Status: AC
  Filled 2020-03-02: qty 250

## 2020-03-02 NOTE — Progress Notes (Signed)
Hematology and Oncology Follow Up Visit  Natalie Hendrix [id number] Apr 10, 1951 69 y.o. 03/02/2020   Principle Diagnosis:  Secondary polycythemia vera- JAK2 negative  Current Therapy:        Phlebotomy to maintain hematocrit less than 38% Plavix 75 mg by mouth daily   Interim History:  Natalie Hendrix is here today for follow-up and phlebotomy. Hct today is 38.6. She is symptomatic with fatigue, palpitations, more frequent headaches, increased joint aches and pains.   Her SOB seems stable. She uses supplemental O2 when needed.  She occasional nausea and dizziness.  No episodes of bleeding. No bruising or petechiae.  No fever, chills, vomiting, cough, rash, chest pain or changes in bowel or bladder habits.  She has some twinges of abdominal discomfort and has noted bloating across her abdomen.  No swelling in her extremities at this time.  No falls or syncopal episodes to report.  Her appetite comes and goes. She is doing her best to stay well hydrated. Her weight is stable.   ECOG Performance Status: 1 - Symptomatic but completely ambulatory  Medications:  Allergies as of 03/02/2020      Reactions   Epinephrine Palpitations   Raises heart rate   Flonase [fluticasone] Palpitations   Protriptyline Hcl Other (See Comments)   Severe constipation   Tamiflu [oseltamivir Phosphate] Other (See Comments)   "like I was having a stroke"   Tizanidine Other (See Comments)   panic   Neurontin [gabapentin] Other (See Comments)   arthralgia   Other Hives, Rash   boiron acteane   Qvar [beclomethasone] Other (See Comments)   Spiriva [tiotropium Bromide Monohydrate] Rash   Tiotropium Rash   Zonegran Other (See Comments)   Mood swings   Advair Diskus [fluticasone-salmeterol] Rash   Baclofen Rash   Chlorzoxazone Rash   Lamotrigine Rash   Levofloxacin Rash   Protriptyline Rash   Ropinirole Rash   Verapamil Rash      Medication List       Accurate as of March 02, 2020  1:30 PM. If you have  any questions, ask your nurse or doctor.        albuterol 108 (90 Base) MCG/ACT inhaler Commonly known as: ProAir HFA Inhale 2 puffs into the lungs every 6 (six) hours as needed for wheezing or shortness of breath.   albuterol (2.5 MG/3ML) 0.083% nebulizer solution Commonly known as: PROVENTIL Take 3 mLs (2.5 mg total) by nebulization every 6 (six) hours as needed for wheezing or shortness of breath.   azelastine 0.1 % nasal spray Commonly known as: ASTELIN Place 1 spray into both nostrils 2 (two) times daily.   azithromycin 250 MG tablet Commonly known as: ZITHROMAX 500mg  (two tablets) today, then 250mg  (1 tablet) for the next 4 days   beclomethasone 42 MCG/SPRAY nasal spray Commonly known as: BECONASE-AQ 2 puffs each nostril once daily   benzonatate 100 MG capsule Commonly known as: TESSALON Take 1 capsule (100 mg total) by mouth every 6 (six) hours as needed for cough.   buPROPion 300 MG 24 hr tablet Commonly known as: WELLBUTRIN XL Take 1 tablet (300 mg total) by mouth daily with breakfast.   CALCIUM 1200 PO Take 1,250 mg by mouth daily.   Centrum Silver tablet Take 1 tablet by mouth daily.   clopidogrel 75 MG tablet Commonly known as: PLAVIX TAKE 1 TABLET BY MOUTH EVERY DAY   cyclobenzaprine 10 MG tablet Commonly known as: FLEXERIL Take 10 mg by mouth every 8 (eight) hours as  needed.   ESTRACE VAGINAL 0.1 MG/GM vaginal cream Generic drug: estradiol Place 1 Applicatorful vaginally daily. Small amount each dose per pt   ezetimibe 10 MG tablet Commonly known as: Zetia TAKE 1 TABLET BY MOUTH DAILY.   fexofenadine 180 MG tablet Commonly known as: Allegra Allergy Take 1 tablet (180 mg total) by mouth daily.   Fish Oil 1200 MG Caps Take 1,200 mg by mouth at bedtime.   hyoscyamine 0.125 MG Tbdp disintergrating tablet Commonly known as: ANASPAZ Place 1 tablet (0.125 mg total) under the tongue 2 (two) times daily as needed. What changed: reasons to take  this   lidocaine 5 % Commonly known as: LIDODERM Place 1 patch onto the skin as needed (pain). Remove & Discard patch within 12 hours or as directed by MD   LORazepam 1 MG tablet Commonly known as: ATIVAN Take 1 tablet (1 mg total) by mouth 2 (two) times daily. May take extra 3rd dose if needed What changed:   when to take this  reasons to take this   magnesium gluconate 500 MG tablet Commonly known as: MAGONATE Take 500 mg by mouth daily as needed (constipation).   meclizine 25 MG tablet Commonly known as: ANTIVERT Take 1 tablet (25 mg total) by mouth 3 (three) times daily as needed for dizziness. 3 month supply What changed: when to take this   metoprolol succinate 50 MG 24 hr tablet Commonly known as: TOPROL-XL Take 1 tablet (50 mg total) by mouth daily with breakfast. What changed: how much to take   montelukast 10 MG tablet Commonly known as: SINGULAIR 1 daily   morphine 15 MG tablet Commonly known as: MSIR Take 15 mg by mouth every 4 (four) hours as needed for severe pain.   Nebulizer Devi Use as directed   OXYGEN Inhale 2.5 L/hr into the lungs continuous.   pantoprazole 40 MG tablet Commonly known as: PROTONIX TAKE 1 TABLET (40 MG TOTAL) BY MOUTH DAILY.   pravastatin 40 MG tablet Commonly known as: PRAVACHOL Take 1 tablet (40 mg total) by mouth at bedtime.   promethazine 25 MG tablet Commonly known as: PHENERGAN Take 25 mg by mouth every 8 (eight) hours as needed for nausea or vomiting.   Systane 0.4-0.3 % Soln Generic drug: Polyethyl Glycol-Propyl Glycol Apply 1 drop to eye daily as needed (dry eyes).   TART CHERRY ADVANCED PO Take 5 mLs by mouth daily. Juice Concentrate- 1tsp daily   Zovirax 5 % Generic drug: acyclovir ointment Apply 1 application topically as needed (flair).       Allergies:  Allergies  Allergen Reactions  . Epinephrine Palpitations    Raises heart rate   . Flonase [Fluticasone] Palpitations  . Protriptyline Hcl  Other (See Comments)    Severe constipation  . Tamiflu [Oseltamivir Phosphate] Other (See Comments)    "like I was having a stroke"  . Tizanidine Other (See Comments)    panic  . Neurontin [Gabapentin] Other (See Comments)    arthralgia  . Other Hives and Rash    boiron acteane  . Qvar [Beclomethasone] Other (See Comments)  . Spiriva [Tiotropium Bromide Monohydrate] Rash  . Tiotropium Rash  . Zonegran Other (See Comments)    Mood swings  . Advair Diskus [Fluticasone-Salmeterol] Rash  . Baclofen Rash  . Chlorzoxazone Rash  . Lamotrigine Rash  . Levofloxacin Rash  . Protriptyline Rash  . Ropinirole Rash  . Verapamil Rash    Past Medical History, Surgical history, Social history, and Family History  were reviewed and updated.  Review of Systems: All other 10 point review of systems is negative.   Physical Exam:  vitals were not taken for this visit.   Wt Readings from Last 3 Encounters:  01/26/20 205 lb (93 kg)  12/30/19 207 lb (93.9 kg)  12/21/19 206 lb 12.8 oz (93.8 kg)    Ocular: Sclerae unicteric, pupils equal, round and reactive to light Ear-nose-throat: Oropharynx clear, dentition fair Lymphatic: No cervical or supraclavicular adenopathy Lungs no rales or rhonchi, good excursion bilaterally Heart regular rate and rhythm, no murmur appreciated Abd soft, nontender, positive bowel sounds, no liver or spleen tip palpated on exam, no fluid wave  MSK no focal spinal tenderness, no joint edema Neuro: non-focal, well-oriented, appropriate affect Breasts: Deferred   Lab Results  Component Value Date   WBC 4.4 03/02/2020   HGB 11.3 (L) 03/02/2020   HCT 38.6 03/02/2020   MCV 75.8 (L) 03/02/2020   PLT 274 03/02/2020   Lab Results  Component Value Date   FERRITIN <4 (L) 01/26/2020   IRON 32 (L) 01/26/2020   TIBC 499 (H) 01/26/2020   UIBC 467 (H) 01/26/2020   IRONPCTSAT 6 (L) 01/26/2020   Lab Results  Component Value Date   RETICCTPCT 1.1 04/27/2015   RBC 5.09  03/02/2020   RETICCTABS 55.1 04/27/2015   No results found for: KPAFRELGTCHN, LAMBDASER, KAPLAMBRATIO No results found for: IGGSERUM, IGA, IGMSERUM No results found for: Odetta Pink, SPEI   Chemistry      Component Value Date/Time   NA 141 01/26/2020 1409   NA 147 (H) 10/08/2017 1410   NA 141 02/17/2017 1132   K 4.6 01/26/2020 1409   K 4.6 10/08/2017 1410   K 5.3 (H) 02/17/2017 1132   CL 107 01/26/2020 1409   CL 106 10/08/2017 1410   CO2 29 01/26/2020 1409   CO2 30 10/08/2017 1410   CO2 29 02/17/2017 1132   BUN 14 01/26/2020 1409   BUN 15 10/08/2017 1410   BUN 17.8 02/17/2017 1132   CREATININE 0.83 01/26/2020 1409   CREATININE 1.0 10/08/2017 1410   CREATININE 0.8 02/17/2017 1132      Component Value Date/Time   CALCIUM 9.6 01/26/2020 1409   CALCIUM 9.0 10/08/2017 1410   CALCIUM 9.3 02/17/2017 1132   ALKPHOS 66 01/26/2020 1409   ALKPHOS 59 10/08/2017 1410   ALKPHOS 79 02/17/2017 1132   AST 21 01/26/2020 1409   AST 22 02/17/2017 1132   ALT 16 01/26/2020 1409   ALT 26 10/08/2017 1410   ALT 20 02/17/2017 1132   BILITOT 0.4 01/26/2020 1409   BILITOT 0.39 02/17/2017 1132       Impression and Plan: Natalie Hendrix is a very pleasant 69 yo caucasian female with secondary polycythemia (JAK-2 negative) due to COPD. We did proceed with phlebotomy and replacement fluids.  We will get an [place] later today to evaluate cause of abdominal discomfort and bloating.  We will plan to see her back in another month.  She will contact our office with any questions or concerns. We can certainly see her sooner if needed.   [name], NP 4/1/20211:30 PM

## 2020-03-02 NOTE — Patient Instructions (Signed)

## 2020-03-02 NOTE — Telephone Encounter (Signed)
Appointments scheduled calendar printed per 4/1 los 

## 2020-03-02 NOTE — Progress Notes (Signed)
Natalie Hendrix presents today for phlebotomy per MD orders. Phlebotomy procedure started at 1430 and ended at 1500. 364 cc removed via 18 G needle at L antecubital site. Replacement fluids given through the same IV. Patient tolerated procedure well. IV needle removed intact.

## 2020-03-03 ENCOUNTER — Telehealth: Payer: Self-pay | Admitting: Family

## 2020-03-03 DIAGNOSIS — N2889 Other specified disorders of kidney and ureter: Secondary | ICD-10-CM

## 2020-03-03 LAB — IRON AND TIBC
Iron: 34 ug/dL — ABNORMAL LOW (ref 41–142)
Saturation Ratios: 7 % — ABNORMAL LOW (ref 21–57)
TIBC: 495 ug/dL — ABNORMAL HIGH (ref 236–444)
UIBC: 461 ug/dL — ABNORMAL HIGH (ref 120–384)

## 2020-03-03 LAB — FERRITIN: Ferritin: <4 ng/mL — ABNORMAL LOW (ref 11–307)

## 2020-03-03 NOTE — Telephone Encounter (Signed)
Spoke with Natalie Hendrix and went over her [place] results from yesterday. We discussed the new masses noted on her left kidney and recommendation for MRI of the kidneys. She verbalized understanding and is in agreement. MRI order placed and scheduling message sent. No questions at this time.

## 2020-03-07 DIAGNOSIS — Z124 Encounter for screening for malignant neoplasm of cervix: Secondary | ICD-10-CM | POA: Diagnosis not present

## 2020-03-07 DIAGNOSIS — Z6831 Body mass index (BMI) 31.0-31.9, adult: Secondary | ICD-10-CM | POA: Diagnosis not present

## 2020-03-07 DIAGNOSIS — N898 Other specified noninflammatory disorders of vagina: Secondary | ICD-10-CM | POA: Diagnosis not present

## 2020-03-08 ENCOUNTER — Other Ambulatory Visit: Payer: Self-pay | Admitting: Family

## 2020-03-08 DIAGNOSIS — N2889 Other specified disorders of kidney and ureter: Secondary | ICD-10-CM

## 2020-03-12 ENCOUNTER — Other Ambulatory Visit: Payer: Self-pay

## 2020-03-12 ENCOUNTER — Ambulatory Visit (HOSPITAL_BASED_OUTPATIENT_CLINIC_OR_DEPARTMENT_OTHER)
Admission: RE | Admit: 2020-03-12 | Discharge: 2020-03-12 | Disposition: A | Discharge status: Home / Self Care | Payer: Medicare HMO | Source: Ambulatory Visit | Attending: Family | Admitting: Family

## 2020-03-12 DIAGNOSIS — K838 Other specified diseases of biliary tract: Secondary | ICD-10-CM | POA: Diagnosis not present

## 2020-03-12 DIAGNOSIS — N2889 Other specified disorders of kidney and ureter: Secondary | ICD-10-CM | POA: Insufficient documentation

## 2020-03-12 MED ORDER — GADOBUTROL 1 MMOL/ML IV SOLN
9.0000 mL | Freq: Once | INTRAVENOUS | Status: AC | PRN
  Administered 2020-03-12: 9 mL via INTRAVENOUS

## 2020-03-13 ENCOUNTER — Telehealth: Payer: Self-pay | Admitting: *Deleted

## 2020-03-13 NOTE — Telephone Encounter (Signed)
Pt notified per order of S. [place] NP that there is "NO renal mass seen on MRI!!!!!!!!!"  Pt appreciative of call and has no questions or concerns at this time.

## 2020-03-13 NOTE — Telephone Encounter (Signed)
-----   Message from Eliezer Bottom, NP sent at 03/13/2020  1:19 PM EDT ----- NO renal mass seen on MRI!!!!!! WOOOOOO HOOOOOOO!!!!!!!!!!  ----- Message ----- From: Interface, Rad Results In Sent: 03/12/2020   7:19 PM EDT To: Eliezer Bottom, NP

## 2020-03-21 ENCOUNTER — Other Ambulatory Visit: Payer: Self-pay | Admitting: *Deleted

## 2020-03-21 DIAGNOSIS — I10 Essential (primary) hypertension: Secondary | ICD-10-CM | POA: Diagnosis not present

## 2020-03-21 DIAGNOSIS — R69 Illness, unspecified: Secondary | ICD-10-CM | POA: Diagnosis not present

## 2020-03-21 DIAGNOSIS — R42 Dizziness and giddiness: Secondary | ICD-10-CM | POA: Diagnosis not present

## 2020-03-21 DIAGNOSIS — J449 Chronic obstructive pulmonary disease, unspecified: Secondary | ICD-10-CM | POA: Diagnosis not present

## 2020-03-21 DIAGNOSIS — Z8673 Personal history of transient ischemic attack (TIA), and cerebral infarction without residual deficits: Secondary | ICD-10-CM

## 2020-03-21 DIAGNOSIS — D45 Polycythemia vera: Secondary | ICD-10-CM

## 2020-03-21 MED ORDER — CLOPIDOGREL BISULFATE 75 MG PO TABS: 75.0000 mg | ORAL_TABLET | Freq: Every day | ORAL | 0 refills | Status: DC

## 2020-03-24 DIAGNOSIS — E785 Hyperlipidemia, unspecified: Secondary | ICD-10-CM | POA: Diagnosis not present

## 2020-03-24 DIAGNOSIS — D45 Polycythemia vera: Secondary | ICD-10-CM | POA: Diagnosis not present

## 2020-03-24 DIAGNOSIS — I639 Cerebral infarction, unspecified: Secondary | ICD-10-CM | POA: Diagnosis not present

## 2020-03-24 DIAGNOSIS — R0902 Hypoxemia: Secondary | ICD-10-CM | POA: Diagnosis not present

## 2020-03-27 ENCOUNTER — Encounter: Payer: Self-pay | Admitting: Family

## 2020-03-30 DIAGNOSIS — H25813 Combined forms of age-related cataract, bilateral: Secondary | ICD-10-CM | POA: Diagnosis not present

## 2020-03-30 DIAGNOSIS — H52223 Regular astigmatism, bilateral: Secondary | ICD-10-CM | POA: Diagnosis not present

## 2020-03-30 DIAGNOSIS — H524 Presbyopia: Secondary | ICD-10-CM | POA: Diagnosis not present

## 2020-03-30 DIAGNOSIS — H5203 Hypermetropia, bilateral: Secondary | ICD-10-CM | POA: Diagnosis not present

## 2020-03-30 DIAGNOSIS — H518 Other specified disorders of binocular movement: Secondary | ICD-10-CM | POA: Diagnosis not present

## 2020-03-31 ENCOUNTER — Other Ambulatory Visit: Payer: Medicare HMO

## 2020-04-05 ENCOUNTER — Encounter: Payer: Self-pay | Admitting: Hematology & Oncology

## 2020-04-05 ENCOUNTER — Inpatient Hospital Stay: Payer: Medicare HMO | Attending: Hematology & Oncology

## 2020-04-05 ENCOUNTER — Inpatient Hospital Stay (HOSPITAL_BASED_OUTPATIENT_CLINIC_OR_DEPARTMENT_OTHER): Payer: Medicare HMO | Admitting: Hematology & Oncology

## 2020-04-05 ENCOUNTER — Inpatient Hospital Stay: Payer: Medicare HMO

## 2020-04-05 ENCOUNTER — Other Ambulatory Visit: Payer: Self-pay

## 2020-04-05 ENCOUNTER — Telehealth: Payer: Self-pay | Admitting: Hematology & Oncology

## 2020-04-05 VITALS — BP 82/59 | HR 72 | Temp 96.9°F | Resp 20 | Wt 202.0 lb

## 2020-04-05 DIAGNOSIS — D751 Secondary polycythemia: Secondary | ICD-10-CM | POA: Insufficient documentation

## 2020-04-05 DIAGNOSIS — J449 Chronic obstructive pulmonary disease, unspecified: Secondary | ICD-10-CM | POA: Insufficient documentation

## 2020-04-05 DIAGNOSIS — Z7902 Long term (current) use of antithrombotics/antiplatelets: Secondary | ICD-10-CM | POA: Insufficient documentation

## 2020-04-05 DIAGNOSIS — D500 Iron deficiency anemia secondary to blood loss (chronic): Secondary | ICD-10-CM | POA: Diagnosis not present

## 2020-04-05 DIAGNOSIS — D45 Polycythemia vera: Secondary | ICD-10-CM | POA: Diagnosis not present

## 2020-04-05 LAB — CBC WITH DIFFERENTIAL (CANCER CENTER ONLY)
Abs Immature Granulocytes: 0 10*3/uL (ref 0.00–0.07)
Basophils Absolute: 0 10*3/uL (ref 0.0–0.1)
Basophils Relative: 1 %
Eosinophils Absolute: 0.2 10*3/uL (ref 0.0–0.5)
Eosinophils Relative: 3 %
HCT: 36.8 % (ref 36.0–46.0)
Hemoglobin: 10.7 g/dL — ABNORMAL LOW (ref 12.0–15.0)
Immature Granulocytes: 0 %
Lymphocytes Relative: 34 %
Lymphs Abs: 1.6 10*3/uL (ref 0.7–4.0)
MCH: 21.9 pg — ABNORMAL LOW (ref 26.0–34.0)
MCHC: 29.1 g/dL — ABNORMAL LOW (ref 30.0–36.0)
MCV: 75.4 fL — ABNORMAL LOW (ref 80.0–100.0)
Monocytes Absolute: 0.5 10*3/uL (ref 0.1–1.0)
Monocytes Relative: 11 %
Neutro Abs: 2.4 10*3/uL (ref 1.7–7.7)
Neutrophils Relative %: 51 %
Platelet Count: 263 10*3/uL (ref 150–400)
RBC: 4.88 MIL/uL (ref 3.87–5.11)
RDW: 16 % — ABNORMAL HIGH (ref 11.5–15.5)
WBC Count: 4.7 10*3/uL (ref 4.0–10.5)
nRBC: 0 % (ref 0.0–0.2)

## 2020-04-05 LAB — CMP (CANCER CENTER ONLY)
ALT: 16 U/L (ref 0–44)
AST: 21 U/L (ref 15–41)
Albumin: 4 g/dL (ref 3.5–5.0)
Alkaline Phosphatase: 63 U/L (ref 38–126)
Anion gap: 6 (ref 5–15)
BUN: 18 mg/dL (ref 8–23)
CO2: 30 mmol/L (ref 22–32)
Calcium: 9.4 mg/dL (ref 8.9–10.3)
Chloride: 104 mmol/L (ref 98–111)
Creatinine: 0.81 mg/dL (ref 0.44–1.00)
GFR, Est AFR Am: >60 mL/min (ref >60)
GFR, Estimated: >60 mL/min (ref >60)
Glucose, Bld: 86 mg/dL (ref 70–99)
Potassium: 4.5 mmol/L (ref 3.5–5.1)
Sodium: 140 mmol/L (ref 135–145)
Total Bilirubin: 0.4 mg/dL (ref 0.3–1.2)
Total Protein: 6.2 g/dL — ABNORMAL LOW (ref 6.5–8.1)

## 2020-04-05 NOTE — Progress Notes (Signed)
Hematology and Oncology Follow Up Visit  Natalie Hendrix [id number] 30-Nov-1951 69 y.o. 04/05/2020   Principle Diagnosis:  Secondary polycythemia vera- JAK2 negative  Current Therapy:        Phlebotomy to maintain hematocrit less than 38% Plavix 75 mg by mouth daily   Interim History:  Natalie Hendrix is here today for follow-up.  Overall, she seems to be doing pretty well.  She has had no specific complaints.  She is still wearing oxygen for her pulmonary issues.  She has not fallen.  She is staying around the house.  She has had her coronavirus vaccines.  Thankfully, her husband is doing pretty well.  He has his own set of health issues.  There is been no problems with fever.  There is been no issues with nausea or vomiting.  She has had no seizures.  She has had no change in bowel or bladder habits.  Overall, her performance status is ECOG 1.   Medications:  Allergies as of 04/05/2020      Reactions   Epinephrine Palpitations   Raises heart rate   Flonase [fluticasone] Palpitations   Protriptyline Hcl Other (See Comments)   Severe constipation   Tamiflu [oseltamivir Phosphate] Other (See Comments)   "like I was having a stroke"   Tizanidine Other (See Comments)   panic   Neurontin [gabapentin] Other (See Comments)   arthralgia   Other Hives, Rash   boiron acteane   Qvar [beclomethasone] Other (See Comments)   Spiriva [tiotropium Bromide Monohydrate] Rash   Tiotropium Rash   Zonegran Other (See Comments)   Mood swings   Advair Diskus [fluticasone-salmeterol] Rash   Baclofen Rash   Chlorzoxazone Rash   Lamotrigine Rash   Levofloxacin Rash   Protriptyline Rash   Ropinirole Rash   Verapamil Rash      Medication List       Accurate as of Apr 05, 2020  3:01 PM. If you have any questions, ask your nurse or doctor.        STOP taking these medications   azithromycin 250 MG tablet Commonly known as: ZITHROMAX Stopped by: [name], MD     TAKE these  medications   albuterol 108 (90 Base) MCG/ACT inhaler Commonly known as: ProAir HFA Inhale 2 puffs into the lungs every 6 (six) hours as needed for wheezing or shortness of breath.   albuterol (2.5 MG/3ML) 0.083% nebulizer solution Commonly known as: PROVENTIL Take 3 mLs (2.5 mg total) by nebulization every 6 (six) hours as needed for wheezing or shortness of breath.   azelastine 0.1 % nasal spray Commonly known as: ASTELIN Place 1 spray into both nostrils 2 (two) times daily.   beclomethasone 42 MCG/SPRAY nasal spray Commonly known as: BECONASE-AQ 2 puffs each nostril once daily   benzonatate 100 MG capsule Commonly known as: TESSALON Take 1 capsule (100 mg total) by mouth every 6 (six) hours as needed for cough.   buPROPion 300 MG 24 hr tablet Commonly known as: WELLBUTRIN XL Take 1 tablet (300 mg total) by mouth daily with breakfast.   CALCIUM 1200 PO Take 1,250 mg by mouth daily.   Centrum Silver tablet Take 1 tablet by mouth daily.   clopidogrel 75 MG tablet Commonly known as: PLAVIX Take 1 tablet (75 mg total) by mouth daily.   cyclobenzaprine 10 MG tablet Commonly known as: FLEXERIL Take 10 mg by mouth every 8 (eight) hours as needed.   ESTRACE VAGINAL 0.1 MG/GM vaginal cream Generic drug:  estradiol Place 1 Applicatorful vaginally daily. Small amount each dose per pt   ezetimibe 10 MG tablet Commonly known as: Zetia TAKE 1 TABLET BY MOUTH DAILY.   fexofenadine 180 MG tablet Commonly known as: Allegra Allergy Take 1 tablet (180 mg total) by mouth daily.   Fish Oil 1200 MG Caps Take 1,200 mg by mouth at bedtime.   hyoscyamine 0.125 MG Tbdp disintergrating tablet Commonly known as: ANASPAZ Place 1 tablet (0.125 mg total) under the tongue 2 (two) times daily as needed. What changed: reasons to take this   lidocaine 5 % Commonly known as: LIDODERM Place 1 patch onto the skin as needed (pain). Remove & Discard patch within 12 hours or as directed by MD     LORazepam 1 MG tablet Commonly known as: ATIVAN Take 1 tablet (1 mg total) by mouth 2 (two) times daily. May take extra 3rd dose if needed What changed:   when to take this  reasons to take this   magnesium gluconate 500 MG tablet Commonly known as: MAGONATE Take 500 mg by mouth daily as needed (constipation).   meclizine 25 MG tablet Commonly known as: ANTIVERT Take 1 tablet (25 mg total) by mouth 3 (three) times daily as needed for dizziness. 3 month supply What changed: when to take this   metoprolol succinate 50 MG 24 hr tablet Commonly known as: TOPROL-XL Take 1 tablet (50 mg total) by mouth daily with breakfast. What changed: how much to take   montelukast 10 MG tablet Commonly known as: SINGULAIR 1 daily   morphine 15 MG tablet Commonly known as: MSIR Take 15 mg by mouth every 4 (four) hours as needed for severe pain.   morphine 30 MG 12 hr tablet Commonly known as: MS CONTIN Take 1 tablet by mouth every 12 (twelve) hours.   Nebulizer Devi Use as directed   OXYGEN Inhale 2.5 L/hr into the lungs continuous.   pantoprazole 40 MG tablet Commonly known as: PROTONIX TAKE 1 TABLET (40 MG TOTAL) BY MOUTH DAILY.   pravastatin 40 MG tablet Commonly known as: PRAVACHOL Take 1 tablet (40 mg total) by mouth at bedtime.   promethazine 25 MG tablet Commonly known as: PHENERGAN Take 25 mg by mouth every 8 (eight) hours as needed for nausea or vomiting.   Systane 0.4-0.3 % Soln Generic drug: Polyethyl Glycol-Propyl Glycol Apply 1 drop to eye daily as needed (dry eyes).   TART CHERRY ADVANCED PO Take 5 mLs by mouth daily. Juice Concentrate- 1tsp daily   Zovirax 5 % Generic drug: acyclovir ointment Apply 1 application topically as needed (flair).       Allergies:  Allergies  Allergen Reactions  . Epinephrine Palpitations    Raises heart rate   . Flonase [Fluticasone] Palpitations  . Protriptyline Hcl Other (See Comments)    Severe constipation  .  Tamiflu [Oseltamivir Phosphate] Other (See Comments)    "like I was having a stroke"  . Tizanidine Other (See Comments)    panic  . Neurontin [Gabapentin] Other (See Comments)    arthralgia  . Other Hives and Rash    boiron acteane  . Qvar [Beclomethasone] Other (See Comments)  . Spiriva [Tiotropium Bromide Monohydrate] Rash  . Tiotropium Rash  . Zonegran Other (See Comments)    Mood swings  . Advair Diskus [Fluticasone-Salmeterol] Rash  . Baclofen Rash  . Chlorzoxazone Rash  . Lamotrigine Rash  . Levofloxacin Rash  . Protriptyline Rash  . Ropinirole Rash  . Verapamil Rash  Past Medical History, Surgical history, Social history, and Family History were reviewed and updated.  Review of Systems: Review of Systems  Constitutional: Negative.   HENT: Negative.   Eyes: Negative.   Respiratory: Negative.   Cardiovascular: Negative.   Gastrointestinal: Negative.   Genitourinary: Negative.   Musculoskeletal: Negative.   Skin: Negative.   Neurological: Negative.   Endo/Heme/Allergies: Negative.   Psychiatric/Behavioral: Negative.      Physical Exam:  weight is 202 lb (91.6 kg). Her temporal temperature is 96.9 F (36.1 C) (abnormal). Her blood pressure is 82/59 (abnormal) and her pulse is 72. Her respiration is 20 and oxygen saturation is 96%.   Wt Readings from Last 3 Encounters:  04/05/20 202 lb (91.6 kg)  03/02/20 204 lb 12.8 oz (92.9 kg)  01/26/20 205 lb (93 kg)    Physical Exam Vitals reviewed.  HENT:     Head: Normocephalic and atraumatic.  Eyes:     Pupils: Pupils are equal, round, and reactive to light.  Cardiovascular:     Rate and Rhythm: Normal rate and regular rhythm.     Heart sounds: Normal heart sounds.  Pulmonary:     Effort: Pulmonary effort is normal.     Breath sounds: Normal breath sounds.  Abdominal:     General: Bowel sounds are normal.     Palpations: Abdomen is soft.  Musculoskeletal:        General: No tenderness or deformity.  Normal range of motion.     Cervical back: Normal range of motion.  Lymphadenopathy:     Cervical: No cervical adenopathy.  Skin:    General: Skin is warm and dry.     Findings: No erythema or rash.  Neurological:     Mental Status: She is alert and oriented to person, place, and time.  Psychiatric:        Behavior: Behavior normal.        Thought Content: Thought content normal.        Judgment: Judgment normal.       Lab Results  Component Value Date   WBC 4.7 04/05/2020   HGB 10.7 (L) 04/05/2020   HCT 36.8 04/05/2020   MCV 75.4 (L) 04/05/2020   PLT 263 04/05/2020   Lab Results  Component Value Date   FERRITIN <4 (L) 03/02/2020   IRON 34 (L) 03/02/2020   TIBC 495 (H) 03/02/2020   UIBC 461 (H) 03/02/2020   IRONPCTSAT 7 (L) 03/02/2020   Lab Results  Component Value Date   RETICCTPCT 1.1 04/27/2015   RBC 4.88 04/05/2020   RETICCTABS 55.1 04/27/2015   No results found for: KPAFRELGTCHN, LAMBDASER, KAPLAMBRATIO No results found for: IGGSERUM, IGA, IGMSERUM No results found for: Odetta Pink, SPEI   Chemistry      Component Value Date/Time   NA 140 04/05/2020 1400   NA 147 (H) 10/08/2017 1410   NA 141 02/17/2017 1132   K 4.5 04/05/2020 1400   K 4.6 10/08/2017 1410   K 5.3 (H) 02/17/2017 1132   CL 104 04/05/2020 1400   CL 106 10/08/2017 1410   CO2 30 04/05/2020 1400   CO2 30 10/08/2017 1410   CO2 29 02/17/2017 1132   BUN 18 04/05/2020 1400   BUN 15 10/08/2017 1410   BUN 17.8 02/17/2017 1132   CREATININE 0.81 04/05/2020 1400   CREATININE 1.0 10/08/2017 1410   CREATININE 0.8 02/17/2017 1132      Component Value Date/Time   CALCIUM 9.4  04/05/2020 1400   CALCIUM 9.0 10/08/2017 1410   CALCIUM 9.3 02/17/2017 1132   ALKPHOS 63 04/05/2020 1400   ALKPHOS 59 10/08/2017 1410   ALKPHOS 79 02/17/2017 1132   AST 21 04/05/2020 1400   AST 22 02/17/2017 1132   ALT 16 04/05/2020 1400   ALT 26 10/08/2017 1410    ALT 20 02/17/2017 1132   BILITOT 0.4 04/05/2020 1400   BILITOT 0.39 02/17/2017 1132       Impression and Plan: Natalie Hendrix is a very pleasant 69 yo caucasian female with secondary polycythemia (JAK-2 negative) due to COPD.  We do not need to phlebotomize her today.  I think her blood count is all right.  Her blood pressure certainly is on the low side.  We will continue to follow her up.  We typically see her back every 6 weeks.  I am just glad that overall her quality life is doing well right now and she has not had the coronavirus.    [name], MD 5/5/20213:01 PM

## 2020-04-05 NOTE — Telephone Encounter (Signed)
Appointments scheduled calendar mailed per 5/5 los

## 2020-04-06 ENCOUNTER — Other Ambulatory Visit: Payer: Medicare HMO

## 2020-04-06 ENCOUNTER — Ambulatory Visit: Payer: Medicare HMO | Admitting: Hematology & Oncology

## 2020-04-06 LAB — IRON AND TIBC
Iron: 94 ug/dL (ref 41–142)
Saturation Ratios: 19 % — ABNORMAL LOW (ref 21–57)
TIBC: 488 ug/dL — ABNORMAL HIGH (ref 236–444)
UIBC: 394 ug/dL — ABNORMAL HIGH (ref 120–384)

## 2020-04-06 LAB — FERRITIN: Ferritin: 4 ng/mL — ABNORMAL LOW (ref 11–307)

## 2020-04-07 ENCOUNTER — Telehealth: Payer: Self-pay | Admitting: Hematology & Oncology

## 2020-04-07 NOTE — Telephone Encounter (Signed)
Returned call to patient confirming her appointments for 6/17 w/ Dr Marin Olp

## 2020-04-13 DIAGNOSIS — R69 Illness, unspecified: Secondary | ICD-10-CM | POA: Diagnosis not present

## 2020-04-18 DIAGNOSIS — R69 Illness, unspecified: Secondary | ICD-10-CM | POA: Diagnosis not present

## 2020-04-18 DIAGNOSIS — J449 Chronic obstructive pulmonary disease, unspecified: Secondary | ICD-10-CM | POA: Diagnosis not present

## 2020-04-18 DIAGNOSIS — E785 Hyperlipidemia, unspecified: Secondary | ICD-10-CM | POA: Diagnosis not present

## 2020-04-18 DIAGNOSIS — M62838 Other muscle spasm: Secondary | ICD-10-CM | POA: Diagnosis not present

## 2020-04-18 DIAGNOSIS — M542 Cervicalgia: Secondary | ICD-10-CM | POA: Diagnosis not present

## 2020-04-18 DIAGNOSIS — G894 Chronic pain syndrome: Secondary | ICD-10-CM | POA: Diagnosis not present

## 2020-04-18 DIAGNOSIS — S134XXS Sprain of ligaments of cervical spine, sequela: Secondary | ICD-10-CM | POA: Diagnosis not present

## 2020-04-18 DIAGNOSIS — M791 Myalgia, unspecified site: Secondary | ICD-10-CM | POA: Diagnosis not present

## 2020-04-18 DIAGNOSIS — S335XXS Sprain of ligaments of lumbar spine, sequela: Secondary | ICD-10-CM | POA: Diagnosis not present

## 2020-04-18 DIAGNOSIS — I10 Essential (primary) hypertension: Secondary | ICD-10-CM | POA: Diagnosis not present

## 2020-04-23 DIAGNOSIS — D45 Polycythemia vera: Secondary | ICD-10-CM | POA: Diagnosis not present

## 2020-04-23 DIAGNOSIS — E785 Hyperlipidemia, unspecified: Secondary | ICD-10-CM | POA: Diagnosis not present

## 2020-04-23 DIAGNOSIS — I639 Cerebral infarction, unspecified: Secondary | ICD-10-CM | POA: Diagnosis not present

## 2020-04-23 DIAGNOSIS — R0902 Hypoxemia: Secondary | ICD-10-CM | POA: Diagnosis not present

## 2020-04-24 ENCOUNTER — Encounter: Payer: Self-pay | Admitting: Internal Medicine

## 2020-04-24 ENCOUNTER — Other Ambulatory Visit: Payer: Self-pay

## 2020-04-24 ENCOUNTER — Ambulatory Visit (INDEPENDENT_AMBULATORY_CARE_PROVIDER_SITE_OTHER): Payer: Medicare HMO | Admitting: Internal Medicine

## 2020-04-24 DIAGNOSIS — J4520 Mild intermittent asthma, uncomplicated: Secondary | ICD-10-CM | POA: Diagnosis not present

## 2020-04-24 DIAGNOSIS — J9611 Chronic respiratory failure with hypoxia: Secondary | ICD-10-CM

## 2020-04-24 NOTE — Patient Instructions (Signed)
Ok to continue O2 2-3 L/ minute  We can continue present breathing meds  Please call if we can help

## 2020-04-24 NOTE — Progress Notes (Signed)
HPI female never smoker followed for asthma, history pneumonia, cough, complicated by past history for thoracic outlet syndrome, chronic hypoxic respiratory failure, complicated by CVA, GERD, allergic rhinitis, polycythemia, depression O2 2 L sleep and prn/Advanced  Nl PFT 2012 except DLCO 62% Allergy profile was NEG, 4.9 total IgE GI/ Dr Paulita Fujita: Ba swallow> stricture and probably mild aspiration Residual right diaphragm elevation after surgery for right thoracic outlet syndrome --------------------------------------------------------------------------------   08/12/2019- 69 year old female never smoker followed for asthma, history pneumonia, cough, chronic hypoxic respiratory failure,  past history for thoracic outlet syndrome, chronic elevation right diaphragm,complicated by CVA, GERD, allergic rhinitis, polycythemia, depression O2 2-3 L sleep and prn/Advanced   POC = Inogen. Lab- Hgb 10.5 Fe def from treatment for polycythemia ------pt on O2 2L qhs and prn Some SAR symptoms currently. Flonase, allegra, singulair. Husband installed [employer] worker on [employer]. Fine till ragweed season this month. Wets her mask for better seal. Covid careful.  Occ vertigo- PCP aware.   04/24/20- 69 year old female never smoker followed for asthma, history pneumonia, cough, chronic hypoxic respiratory failure,  past history for thoracic outlet syndrome, chronic elevation right diaphragm,complicated by CVA, GERD, allergic rhinitis, polycythemia, depression O2 2-3 L sleep and prn/Adapt   POC = Inogen. Albuterol hfa, neb albuterol, benzonatate, allegra,  -----cough and SOB decreased since last visit  Little wheeze or cough now. DOE w exertion- uses Proair about 2x/ day. Husband here, helps her around house.  Had 2 Moderna Covax.  Review of Systems- See HPI   + = positive Constitutional:   No-   weight loss, night sweats, fevers, chills, fatigue, lassitude. HEENT:   frequent  headaches,  Some difficulty  swallowing,, sore throat,       No-  Sneezing,+ itching, ear ache, nasal congestion, post nasal drip,  CV:  No-   chest pain, orthopnea, PND, swelling in lower extremities, anasarca, dizziness, palpitations Resp: + shortness of breath with exertion or at rest. productive cough              No-  coughing up of blood.              No-  change in color of mucus.   wheezing.   Skin: No-   rash or lesions. GI:  No-   heartburn, indigestion, abdominal pain, nausea, vomiting, GU:  MS:  No-   joint pain or swelling.   Neuro- + tremor and poor balance Psych:  No- change in mood or affect. No depression or anxiety.  No memory loss.    Objective:   Physical Exam General- Alert, Oriented, Affect+ tearful, Distress- none acute,  Overweight.  + Rolling walker              O2 POC 2L. Skin- rash-none, lesions- none, excoriation- none Lymphadenopathy- none Head- atraumatic            Eyes- Gross vision intact, PERRLA, conjunctivae clear secretions            Ears- Hearing, canals            Nose- Clear, No- Septal dev, mucus, polyps, erosion, perforation             Throat- Mallampati III , mucosa clear , drainage- none, tonsils- atrophic, Neck- flexible , trachea midline, no stridor , thyroid nl, carotid no bruit Chest - symmetrical excursion , unlabored           Heart/CV- RRR , no murmur , no gallop  , no rub, nl s1 s2                           -  JVD- none , edema- none, stasis changes- none, varices- none           Lung- clear to P&A, wheeze-none, cough-none,                                                    dullness-none, rub- none, unlabored           Chest wall- +Vascular surgery scar Right Upper Anterior chest Abd- Br/ Gen/ Rectal- Not done, not indicated Extrem- cyanosis- none, clubbing, none, atrophy- none, strength- . +Rolling walker Neuro- + some word searching

## 2020-04-26 ENCOUNTER — Telehealth: Payer: Self-pay | Admitting: Internal Medicine

## 2020-04-26 ENCOUNTER — Telehealth: Payer: Self-pay | Admitting: Pulmonary Disease

## 2020-04-26 NOTE — Telephone Encounter (Signed)
error 

## 2020-05-03 ENCOUNTER — Encounter: Payer: Self-pay | Admitting: Hematology & Oncology

## 2020-05-03 ENCOUNTER — Other Ambulatory Visit: Payer: Self-pay | Admitting: *Deleted

## 2020-05-03 DIAGNOSIS — Z8673 Personal history of transient ischemic attack (TIA), and cerebral infarction without residual deficits: Secondary | ICD-10-CM

## 2020-05-03 DIAGNOSIS — D45 Polycythemia vera: Secondary | ICD-10-CM

## 2020-05-03 MED ORDER — CLOPIDOGREL BISULFATE 75 MG PO TABS: 75.0000 mg | ORAL_TABLET | Freq: Every day | ORAL | 1 refills | Status: DC

## 2020-05-04 NOTE — Telephone Encounter (Signed)
Routed to [facility], did you resolve this issue?

## 2020-05-09 NOTE — Telephone Encounter (Signed)
Sent to Jeralyn Bennett for review.

## 2020-05-16 DIAGNOSIS — R69 Illness, unspecified: Secondary | ICD-10-CM | POA: Diagnosis not present

## 2020-05-16 DIAGNOSIS — J449 Chronic obstructive pulmonary disease, unspecified: Secondary | ICD-10-CM | POA: Diagnosis not present

## 2020-05-16 DIAGNOSIS — I10 Essential (primary) hypertension: Secondary | ICD-10-CM | POA: Diagnosis not present

## 2020-05-16 DIAGNOSIS — E785 Hyperlipidemia, unspecified: Secondary | ICD-10-CM | POA: Diagnosis not present

## 2020-05-17 DIAGNOSIS — M542 Cervicalgia: Secondary | ICD-10-CM | POA: Diagnosis not present

## 2020-05-17 DIAGNOSIS — G894 Chronic pain syndrome: Secondary | ICD-10-CM | POA: Diagnosis not present

## 2020-05-17 DIAGNOSIS — S335XXS Sprain of ligaments of lumbar spine, sequela: Secondary | ICD-10-CM | POA: Diagnosis not present

## 2020-05-17 DIAGNOSIS — S134XXS Sprain of ligaments of cervical spine, sequela: Secondary | ICD-10-CM | POA: Diagnosis not present

## 2020-05-17 DIAGNOSIS — M4726 Other spondylosis with radiculopathy, lumbar region: Secondary | ICD-10-CM | POA: Diagnosis not present

## 2020-05-18 ENCOUNTER — Inpatient Hospital Stay: Payer: Medicare HMO

## 2020-05-18 ENCOUNTER — Inpatient Hospital Stay: Payer: Medicare HMO | Admitting: Hematology & Oncology

## 2020-05-24 DIAGNOSIS — D45 Polycythemia vera: Secondary | ICD-10-CM | POA: Diagnosis not present

## 2020-05-24 DIAGNOSIS — R0902 Hypoxemia: Secondary | ICD-10-CM | POA: Diagnosis not present

## 2020-05-24 DIAGNOSIS — E785 Hyperlipidemia, unspecified: Secondary | ICD-10-CM | POA: Diagnosis not present

## 2020-05-24 DIAGNOSIS — I639 Cerebral infarction, unspecified: Secondary | ICD-10-CM | POA: Diagnosis not present

## 2020-05-28 ENCOUNTER — Encounter: Payer: Self-pay | Admitting: Internal Medicine

## 2020-05-28 NOTE — Assessment & Plan Note (Signed)
Rescue inhaler and occasionally her nebulizer have been sufficient, without recent exacerbation Plan- Keeping meds simple for now

## 2020-05-28 NOTE — Assessment & Plan Note (Signed)
Continues to need and benefit from O2 Plan- continue O2 2-3L 24/7

## 2020-05-30 ENCOUNTER — Encounter: Payer: Self-pay | Admitting: Family

## 2020-05-30 ENCOUNTER — Inpatient Hospital Stay: Payer: Medicare HMO | Attending: Hematology & Oncology

## 2020-05-30 ENCOUNTER — Inpatient Hospital Stay: Payer: Medicare HMO

## 2020-05-30 ENCOUNTER — Inpatient Hospital Stay (HOSPITAL_BASED_OUTPATIENT_CLINIC_OR_DEPARTMENT_OTHER): Payer: Medicare HMO | Admitting: Family

## 2020-05-30 ENCOUNTER — Other Ambulatory Visit: Payer: Self-pay

## 2020-05-30 VITALS — BP 115/67 | HR 68 | Resp 18

## 2020-05-30 VITALS — BP 124/58 | HR 74 | Temp 99.0°F | Resp 18 | Ht 67.0 in | Wt 197.0 lb

## 2020-05-30 DIAGNOSIS — G43909 Migraine, unspecified, not intractable, without status migrainosus: Secondary | ICD-10-CM | POA: Diagnosis not present

## 2020-05-30 DIAGNOSIS — Z7902 Long term (current) use of antithrombotics/antiplatelets: Secondary | ICD-10-CM | POA: Insufficient documentation

## 2020-05-30 DIAGNOSIS — J449 Chronic obstructive pulmonary disease, unspecified: Secondary | ICD-10-CM | POA: Insufficient documentation

## 2020-05-30 DIAGNOSIS — D45 Polycythemia vera: Secondary | ICD-10-CM

## 2020-05-30 DIAGNOSIS — D751 Secondary polycythemia: Secondary | ICD-10-CM | POA: Insufficient documentation

## 2020-05-30 DIAGNOSIS — R0602 Shortness of breath: Secondary | ICD-10-CM | POA: Insufficient documentation

## 2020-05-30 DIAGNOSIS — R002 Palpitations: Secondary | ICD-10-CM | POA: Diagnosis not present

## 2020-05-30 LAB — CBC WITH DIFFERENTIAL (CANCER CENTER ONLY)
Abs Immature Granulocytes: 0 10*3/uL (ref 0.00–0.07)
Basophils Absolute: 0 10*3/uL (ref 0.0–0.1)
Basophils Relative: 1 %
Eosinophils Absolute: 0.1 10*3/uL (ref 0.0–0.5)
Eosinophils Relative: 1 %
HCT: 40.3 % (ref 36.0–46.0)
Hemoglobin: 12.3 g/dL (ref 12.0–15.0)
Immature Granulocytes: 0 %
Lymphocytes Relative: 23 %
Lymphs Abs: 1.1 10*3/uL (ref 0.7–4.0)
MCH: 23.2 pg — ABNORMAL LOW (ref 26.0–34.0)
MCHC: 30.5 g/dL (ref 30.0–36.0)
MCV: 75.9 fL — ABNORMAL LOW (ref 80.0–100.0)
Monocytes Absolute: 0.4 10*3/uL (ref 0.1–1.0)
Monocytes Relative: 8 %
Neutro Abs: 3.2 10*3/uL (ref 1.7–7.7)
Neutrophils Relative %: 67 %
Platelet Count: 231 10*3/uL (ref 150–400)
RBC: 5.31 MIL/uL — ABNORMAL HIGH (ref 3.87–5.11)
RDW: 19.2 % — ABNORMAL HIGH (ref 11.5–15.5)
WBC Count: 4.8 10*3/uL (ref 4.0–10.5)
nRBC: 0 % (ref 0.0–0.2)

## 2020-05-30 LAB — CMP (CANCER CENTER ONLY)
ALT: 16 U/L (ref 0–44)
AST: 21 U/L (ref 15–41)
Albumin: 4 g/dL (ref 3.5–5.0)
Alkaline Phosphatase: 61 U/L (ref 38–126)
Anion gap: 6 (ref 5–15)
BUN: 17 mg/dL (ref 8–23)
CO2: 27 mmol/L (ref 22–32)
Calcium: 9.3 mg/dL (ref 8.9–10.3)
Chloride: 107 mmol/L (ref 98–111)
Creatinine: 0.8 mg/dL (ref 0.44–1.00)
GFR, Est AFR Am: >60 mL/min (ref >60)
GFR, Estimated: >60 mL/min (ref >60)
Glucose, Bld: 102 mg/dL — ABNORMAL HIGH (ref 70–99)
Potassium: 4.5 mmol/L (ref 3.5–5.1)
Sodium: 140 mmol/L (ref 135–145)
Total Bilirubin: 0.5 mg/dL (ref 0.3–1.2)
Total Protein: 6.7 g/dL (ref 6.5–8.1)

## 2020-05-30 MED ORDER — SODIUM CHLORIDE 0.9 % IV SOLN
Freq: Once | INTRAVENOUS | Status: AC
  Filled 2020-05-30: qty 250

## 2020-05-30 NOTE — Progress Notes (Signed)
[name] presents today for phlebotomy per MD orders. Phlebotomy procedure started at 1405 and ended at 1431. 518 cc removed via 20 G needle at L lateral AC. Patient tolerated procedure well. IV replacement fluids given through the same IV. IV needle removed intact.

## 2020-05-30 NOTE — Progress Notes (Signed)
Hematology and Oncology Follow Up Visit  Natalie Hendrix [id number] June 03, 1951 69 y.o. 05/30/2020   Principle Diagnosis:  Secondary polycythemia vera- JAK2 negative  Current Therapy: Phlebotomy to maintain hematocrit less than 38% Plavix 75 mg by mouth daily   Interim History:  Ms. Derk is here today for follow-up and phlebotomy. Hct today is 40.3%.  She has migraines at times.  SOB with over exertion and will take a break to rest when needed and uses supplemental O2 daily.  She has occasional palpitations.  No fever, chills, n/v, cough, rash, dizziness, chest pain, abdominal pain or changes in bowel or bladder habits.  No swelling, numbness or tingling in her extremities at this time.  She states that she needs to have surgery on her left [facility] joint but is still discussing this with her surgeon.  No falls or syncope. She is ambulating with a rolling walker for added support.  No episodes of bleeding. No bruising or petechiae.  She has maintained a good appetite and is staying well hydrated. She also drinks a protein supplement daily.   ECOG Performance Status: 1 - Symptomatic but completely ambulatory  Medications:  Allergies as of 05/30/2020      Reactions   Epinephrine Palpitations   Raises heart rate   Flonase [fluticasone] Palpitations   Protriptyline Hcl Other (See Comments)   Severe constipation   Tamiflu [oseltamivir Phosphate] Other (See Comments)   "like I was having a stroke"   Tizanidine Other (See Comments)   panic   Neurontin [gabapentin] Other (See Comments)   arthralgia   Other Hives, Rash   boiron acteane   Qvar [beclomethasone] Other (See Comments)   Spiriva [tiotropium Bromide Monohydrate] Rash   Tiotropium Rash   Zonegran Other (See Comments)   Mood swings   Advair Diskus [fluticasone-salmeterol] Rash   Baclofen Rash   Chlorzoxazone Rash   Lamotrigine Rash   Levofloxacin Rash   Protriptyline Rash   Ropinirole Rash   Verapamil Rash       Medication List       Accurate as of May 30, 2020  1:13 PM. If you have any questions, ask your nurse or doctor.        albuterol 108 (90 Base) MCG/ACT inhaler Commonly known as: ProAir HFA Inhale 2 puffs into the lungs every 6 (six) hours as needed for wheezing or shortness of breath.   albuterol (2.5 MG/3ML) 0.083% nebulizer solution Commonly known as: PROVENTIL Take 3 mLs (2.5 mg total) by nebulization every 6 (six) hours as needed for wheezing or shortness of breath.   azelastine 0.1 % nasal spray Commonly known as: ASTELIN Place 1 spray into both nostrils 2 (two) times daily.   beclomethasone 42 MCG/SPRAY nasal spray Commonly known as: BECONASE-AQ 2 puffs each nostril once daily   benzonatate 100 MG capsule Commonly known as: TESSALON Take 1 capsule (100 mg total) by mouth every 6 (six) hours as needed for cough.   buPROPion 300 MG 24 hr tablet Commonly known as: WELLBUTRIN XL Take 1 tablet (300 mg total) by mouth daily with breakfast.   CALCIUM 1200 PO Take 1,250 mg by mouth daily.   Centrum Silver tablet Take 1 tablet by mouth daily.   clopidogrel 75 MG tablet Commonly known as: PLAVIX Take 1 tablet (75 mg total) by mouth daily.   cyclobenzaprine 10 MG tablet Commonly known as: FLEXERIL Take 10 mg by mouth every 8 (eight) hours as needed.   ESTRACE VAGINAL 0.1 MG/GM vaginal cream Generic  drug: estradiol Place 1 Applicatorful vaginally daily. Small amount each dose per pt   ezetimibe 10 MG tablet Commonly known as: Zetia TAKE 1 TABLET BY MOUTH DAILY.   fexofenadine 180 MG tablet Commonly known as: Allegra Allergy Take 1 tablet (180 mg total) by mouth daily.   Fish Oil 1200 MG Caps Take 1,200 mg by mouth at bedtime.   hyoscyamine 0.125 MG Tbdp disintergrating tablet Commonly known as: ANASPAZ Place 1 tablet (0.125 mg total) under the tongue 2 (two) times daily as needed. What changed: reasons to take this   lidocaine 5 % Commonly known as:  LIDODERM Place 1 patch onto the skin as needed (pain). Remove & Discard patch within 12 hours or as directed by MD   LORazepam 1 MG tablet Commonly known as: ATIVAN Take 1 tablet (1 mg total) by mouth 2 (two) times daily. May take extra 3rd dose if needed What changed:   when to take this  reasons to take this   magnesium gluconate 500 MG tablet Commonly known as: MAGONATE Take 500 mg by mouth daily as needed (constipation).   meclizine 25 MG tablet Commonly known as: ANTIVERT Take 1 tablet (25 mg total) by mouth 3 (three) times daily as needed for dizziness. 3 month supply What changed: when to take this   metoprolol succinate 50 MG 24 hr tablet Commonly known as: TOPROL-XL Take 1 tablet (50 mg total) by mouth daily with breakfast. What changed: how much to take   montelukast 10 MG tablet Commonly known as: SINGULAIR 1 daily   morphine 15 MG tablet Commonly known as: MSIR Take 15 mg by mouth every 4 (four) hours as needed for severe pain.   morphine 30 MG 12 hr tablet Commonly known as: MS CONTIN Take 1 tablet by mouth every 12 (twelve) hours.   Nebulizer Devi Use as directed   OXYGEN Inhale 2.5 L/hr into the lungs continuous.   pantoprazole 40 MG tablet Commonly known as: PROTONIX TAKE 1 TABLET (40 MG TOTAL) BY MOUTH DAILY.   pravastatin 40 MG tablet Commonly known as: PRAVACHOL Take 1 tablet (40 mg total) by mouth at bedtime.   promethazine 25 MG tablet Commonly known as: PHENERGAN Take 25 mg by mouth every 8 (eight) hours as needed for nausea or vomiting.   Systane 0.4-0.3 % Soln Generic drug: Polyethyl Glycol-Propyl Glycol Apply 1 drop to eye daily as needed (dry eyes).   TART CHERRY ADVANCED PO Take 5 mLs by mouth daily. Juice Concentrate- 1tsp daily   Zovirax 5 % Generic drug: acyclovir ointment Apply 1 application topically as needed (flair).       Allergies:  Allergies  Allergen Reactions  . Epinephrine Palpitations    Raises heart  rate   . Flonase [Fluticasone] Palpitations  . Protriptyline Hcl Other (See Comments)    Severe constipation  . Tamiflu [Oseltamivir Phosphate] Other (See Comments)    "like I was having a stroke"  . Tizanidine Other (See Comments)    panic  . Neurontin [Gabapentin] Other (See Comments)    arthralgia  . Other Hives and Rash    boiron acteane  . Qvar [Beclomethasone] Other (See Comments)  . Spiriva [Tiotropium Bromide Monohydrate] Rash  . Tiotropium Rash  . Zonegran Other (See Comments)    Mood swings  . Advair Diskus [Fluticasone-Salmeterol] Rash  . Baclofen Rash  . Chlorzoxazone Rash  . Lamotrigine Rash  . Levofloxacin Rash  . Protriptyline Rash  . Ropinirole Rash  . Verapamil Rash  Past Medical History, Surgical history, Social history, and Family History were reviewed and updated.  Review of Systems: All other 10 point review of systems is negative.   Physical Exam:  vitals were not taken for this visit.   Wt Readings from Last 3 Encounters:  04/24/20 201 lb 9.6 oz (91.4 kg)  04/05/20 202 lb (91.6 kg)  03/02/20 204 lb 12.8 oz (92.9 kg)    Ocular: Sclerae unicteric, pupils equal, round and reactive to light Ear-nose-throat: Oropharynx clear, dentition fair Lymphatic: No cervical or supraclavicular adenopathy Lungs no rales or rhonchi, good excursion bilaterally Heart regular rate and rhythm, no murmur appreciated Abd soft, nontender, positive bowel sounds, no liver or spleen tip palpated on exam, no fluid wave  MSK no focal spinal tenderness, no joint edema Neuro: non-focal, well-oriented, appropriate affect Breasts: Deferred   Lab Results  Component Value Date   WBC 4.7 04/05/2020   HGB 10.7 (L) 04/05/2020   HCT 36.8 04/05/2020   MCV 75.4 (L) 04/05/2020   PLT 263 04/05/2020   Lab Results  Component Value Date   FERRITIN 4 (L) 04/05/2020   IRON 94 04/05/2020   TIBC 488 (H) 04/05/2020   UIBC 394 (H) 04/05/2020   IRONPCTSAT 19 (L) 04/05/2020   Lab  Results  Component Value Date   RETICCTPCT 1.1 04/27/2015   RBC 4.88 04/05/2020   RETICCTABS 55.1 04/27/2015   No results found for: KPAFRELGTCHN, LAMBDASER, KAPLAMBRATIO No results found for: IGGSERUM, IGA, IGMSERUM No results found for: Odetta Pink, SPEI   Chemistry      Component Value Date/Time   NA 140 04/05/2020 1400   NA 147 (H) 10/08/2017 1410   NA 141 02/17/2017 1132   K 4.5 04/05/2020 1400   K 4.6 10/08/2017 1410   K 5.3 (H) 02/17/2017 1132   CL 104 04/05/2020 1400   CL 106 10/08/2017 1410   CO2 30 04/05/2020 1400   CO2 30 10/08/2017 1410   CO2 29 02/17/2017 1132   BUN 18 04/05/2020 1400   BUN 15 10/08/2017 1410   BUN 17.8 02/17/2017 1132   CREATININE 0.81 04/05/2020 1400   CREATININE 1.0 10/08/2017 1410   CREATININE 0.8 02/17/2017 1132      Component Value Date/Time   CALCIUM 9.4 04/05/2020 1400   CALCIUM 9.0 10/08/2017 1410   CALCIUM 9.3 02/17/2017 1132   ALKPHOS 63 04/05/2020 1400   ALKPHOS 59 10/08/2017 1410   ALKPHOS 79 02/17/2017 1132   AST 21 04/05/2020 1400   AST 22 02/17/2017 1132   ALT 16 04/05/2020 1400   ALT 26 10/08/2017 1410   ALT 20 02/17/2017 1132   BILITOT 0.4 04/05/2020 1400   BILITOT 0.39 02/17/2017 1132       Impression and Plan: Ms. Ilg is a very pleasant 69 yo caucasian female with secondary polycythemia (JAK-2 negative) due to COPD. She had a phlebotomy today followed by replacement fluids for Hct 40.3%. We will see her again in another 6 weeks.  She will contact our office with any questions or concerns. We can certainly see her sooner if needed.   [name], NP 6/29/20211:13 PM

## 2020-05-30 NOTE — Patient Instructions (Signed)

## 2020-05-31 DIAGNOSIS — R69 Illness, unspecified: Secondary | ICD-10-CM | POA: Diagnosis not present

## 2020-05-31 LAB — IRON AND TIBC
Iron: 55 ug/dL (ref 41–142)
Saturation Ratios: 12 % — ABNORMAL LOW (ref 21–57)
TIBC: 470 ug/dL — ABNORMAL HIGH (ref 236–444)
UIBC: 415 ug/dL — ABNORMAL HIGH (ref 120–384)

## 2020-05-31 LAB — FERRITIN: Ferritin: 5 ng/mL — ABNORMAL LOW (ref 11–307)

## 2020-06-14 DIAGNOSIS — M62838 Other muscle spasm: Secondary | ICD-10-CM | POA: Diagnosis not present

## 2020-06-14 DIAGNOSIS — M542 Cervicalgia: Secondary | ICD-10-CM | POA: Diagnosis not present

## 2020-06-14 DIAGNOSIS — M791 Myalgia, unspecified site: Secondary | ICD-10-CM | POA: Diagnosis not present

## 2020-06-14 DIAGNOSIS — S134XXS Sprain of ligaments of cervical spine, sequela: Secondary | ICD-10-CM | POA: Diagnosis not present

## 2020-06-14 DIAGNOSIS — G894 Chronic pain syndrome: Secondary | ICD-10-CM | POA: Diagnosis not present

## 2020-06-14 DIAGNOSIS — S335XXS Sprain of ligaments of lumbar spine, sequela: Secondary | ICD-10-CM | POA: Diagnosis not present

## 2020-06-15 ENCOUNTER — Encounter: Payer: Self-pay | Admitting: Family

## 2020-06-15 DIAGNOSIS — R69 Illness, unspecified: Secondary | ICD-10-CM | POA: Diagnosis not present

## 2020-06-15 DIAGNOSIS — J449 Chronic obstructive pulmonary disease, unspecified: Secondary | ICD-10-CM | POA: Diagnosis not present

## 2020-06-15 DIAGNOSIS — G8929 Other chronic pain: Secondary | ICD-10-CM | POA: Diagnosis not present

## 2020-06-15 DIAGNOSIS — I10 Essential (primary) hypertension: Secondary | ICD-10-CM | POA: Diagnosis not present

## 2020-06-23 DIAGNOSIS — E785 Hyperlipidemia, unspecified: Secondary | ICD-10-CM | POA: Diagnosis not present

## 2020-06-23 DIAGNOSIS — I639 Cerebral infarction, unspecified: Secondary | ICD-10-CM | POA: Diagnosis not present

## 2020-06-23 DIAGNOSIS — R0902 Hypoxemia: Secondary | ICD-10-CM | POA: Diagnosis not present

## 2020-06-23 DIAGNOSIS — D45 Polycythemia vera: Secondary | ICD-10-CM | POA: Diagnosis not present

## 2020-07-11 ENCOUNTER — Ambulatory Visit: Payer: Medicare HMO | Admitting: Family

## 2020-07-11 ENCOUNTER — Inpatient Hospital Stay: Payer: Medicare HMO | Admitting: Family

## 2020-07-11 ENCOUNTER — Other Ambulatory Visit: Payer: Medicare HMO

## 2020-07-11 ENCOUNTER — Inpatient Hospital Stay: Payer: Medicare HMO

## 2020-07-11 ENCOUNTER — Ambulatory Visit: Payer: Medicare HMO

## 2020-07-14 ENCOUNTER — Ambulatory Visit: Payer: Medicare HMO

## 2020-07-14 ENCOUNTER — Other Ambulatory Visit: Payer: Medicare HMO

## 2020-07-14 ENCOUNTER — Ambulatory Visit: Payer: Medicare HMO | Admitting: Family

## 2020-07-19 DIAGNOSIS — G8929 Other chronic pain: Secondary | ICD-10-CM | POA: Diagnosis not present

## 2020-07-19 DIAGNOSIS — I10 Essential (primary) hypertension: Secondary | ICD-10-CM | POA: Diagnosis not present

## 2020-07-19 DIAGNOSIS — J449 Chronic obstructive pulmonary disease, unspecified: Secondary | ICD-10-CM | POA: Diagnosis not present

## 2020-07-19 DIAGNOSIS — R69 Illness, unspecified: Secondary | ICD-10-CM | POA: Diagnosis not present

## 2020-07-24 DIAGNOSIS — E785 Hyperlipidemia, unspecified: Secondary | ICD-10-CM | POA: Diagnosis not present

## 2020-07-24 DIAGNOSIS — R0902 Hypoxemia: Secondary | ICD-10-CM | POA: Diagnosis not present

## 2020-07-24 DIAGNOSIS — I639 Cerebral infarction, unspecified: Secondary | ICD-10-CM | POA: Diagnosis not present

## 2020-07-24 DIAGNOSIS — D45 Polycythemia vera: Secondary | ICD-10-CM | POA: Diagnosis not present

## 2020-07-26 ENCOUNTER — Other Ambulatory Visit: Payer: Self-pay

## 2020-07-26 ENCOUNTER — Inpatient Hospital Stay (HOSPITAL_BASED_OUTPATIENT_CLINIC_OR_DEPARTMENT_OTHER): Payer: Medicare HMO | Admitting: Family

## 2020-07-26 ENCOUNTER — Encounter: Payer: Self-pay | Admitting: Family

## 2020-07-26 ENCOUNTER — Inpatient Hospital Stay: Payer: Medicare HMO

## 2020-07-26 ENCOUNTER — Inpatient Hospital Stay: Payer: Medicare HMO | Attending: Hematology & Oncology

## 2020-07-26 VITALS — BP 121/56 | HR 79 | Temp 98.4°F | Resp 18 | Wt 197.8 lb

## 2020-07-26 VITALS — BP 118/63 | HR 67 | Resp 20

## 2020-07-26 DIAGNOSIS — D45 Polycythemia vera: Secondary | ICD-10-CM

## 2020-07-26 DIAGNOSIS — D751 Secondary polycythemia: Secondary | ICD-10-CM | POA: Diagnosis not present

## 2020-07-26 DIAGNOSIS — D500 Iron deficiency anemia secondary to blood loss (chronic): Secondary | ICD-10-CM

## 2020-07-26 DIAGNOSIS — E611 Iron deficiency: Secondary | ICD-10-CM | POA: Insufficient documentation

## 2020-07-26 LAB — CBC WITH DIFFERENTIAL (CANCER CENTER ONLY)
Abs Immature Granulocytes: 0.01 10*3/uL (ref 0.00–0.07)
Basophils Absolute: 0.1 10*3/uL (ref 0.0–0.1)
Basophils Relative: 1 %
Eosinophils Absolute: 0.2 10*3/uL (ref 0.0–0.5)
Eosinophils Relative: 3 %
HCT: 42.3 % (ref 36.0–46.0)
Hemoglobin: 12.8 g/dL (ref 12.0–15.0)
Immature Granulocytes: 0 %
Lymphocytes Relative: 31 %
Lymphs Abs: 1.7 10*3/uL (ref 0.7–4.0)
MCH: 23.6 pg — ABNORMAL LOW (ref 26.0–34.0)
MCHC: 30.3 g/dL (ref 30.0–36.0)
MCV: 78 fL — ABNORMAL LOW (ref 80.0–100.0)
Monocytes Absolute: 0.6 10*3/uL (ref 0.1–1.0)
Monocytes Relative: 11 %
Neutro Abs: 2.9 10*3/uL (ref 1.7–7.7)
Neutrophils Relative %: 54 %
Platelet Count: 249 10*3/uL (ref 150–400)
RBC: 5.42 MIL/uL — ABNORMAL HIGH (ref 3.87–5.11)
RDW: 16.9 % — ABNORMAL HIGH (ref 11.5–15.5)
WBC Count: 5.4 10*3/uL (ref 4.0–10.5)
nRBC: 0 % (ref 0.0–0.2)

## 2020-07-26 LAB — CMP (CANCER CENTER ONLY)
ALT: 18 U/L (ref 0–44)
AST: 21 U/L (ref 15–41)
Albumin: 4.2 g/dL (ref 3.5–5.0)
Alkaline Phosphatase: 68 U/L (ref 38–126)
Anion gap: 4 — ABNORMAL LOW (ref 5–15)
BUN: 19 mg/dL (ref 8–23)
CO2: 32 mmol/L (ref 22–32)
Calcium: 9.5 mg/dL (ref 8.9–10.3)
Chloride: 104 mmol/L (ref 98–111)
Creatinine: 0.83 mg/dL (ref 0.44–1.00)
GFR, Est AFR Am: >60 mL/min (ref >60)
GFR, Estimated: >60 mL/min (ref >60)
Glucose, Bld: 86 mg/dL (ref 70–99)
Potassium: 4.4 mmol/L (ref 3.5–5.1)
Sodium: 140 mmol/L (ref 135–145)
Total Bilirubin: 0.4 mg/dL (ref 0.3–1.2)
Total Protein: 6.7 g/dL (ref 6.5–8.1)

## 2020-07-26 LAB — IRON AND TIBC
Iron: 47 ug/dL (ref 41–142)
Saturation Ratios: 10 % — ABNORMAL LOW (ref 21–57)
TIBC: 483 ug/dL — ABNORMAL HIGH (ref 236–444)
UIBC: 435 ug/dL — ABNORMAL HIGH (ref 120–384)

## 2020-07-26 LAB — FERRITIN: Ferritin: <4 ng/mL — ABNORMAL LOW (ref 11–307)

## 2020-07-26 MED ORDER — SODIUM CHLORIDE 0.9 % IV SOLN
Freq: Once | INTRAVENOUS | Status: AC
  Filled 2020-07-26: qty 250

## 2020-07-26 NOTE — Patient Instructions (Signed)
Therapeutic Phlebotomy Therapeutic phlebotomy is the planned removal of blood from a person's body for the purpose of treating a medical condition. The procedure is similar to donating blood. Usually, about a pint (470 mL, or 0.47 L) of blood is removed. The average adult has 9-12 pints (4.3-5.7 L) of blood in the body. Therapeutic phlebotomy may be used to treat the following medical conditions:  Hemochromatosis. This is a condition in which the blood contains too much iron.  Polycythemia vera. This is a condition in which the blood contains too many red blood cells.  Porphyria cutanea tarda. This is a disease in which an important part of hemoglobin is not made properly. It results in the buildup of abnormal amounts of porphyrins in the body.  Sickle cell disease. This is a condition in which the red blood cells form an abnormal crescent shape rather than a round shape. Tell a health care provider about:  Any allergies you have.  All medicines you are taking, including vitamins, herbs, eye drops, creams, and over-the-counter medicines.  Any problems you or family members have had with anesthetic medicines.  Any blood disorders you have.  Any surgeries you have had.  Any medical conditions you have.  Whether you are pregnant or may be pregnant. What are the risks? Generally, this is a safe procedure. However, problems may occur, including:  Nausea or light-headedness.  Low blood pressure (hypotension).  Soreness, bleeding, swelling, or bruising at the needle insertion site.  Infection. What happens before the procedure?  Follow instructions from your health care provider about eating or drinking restrictions.  Ask your health care provider about: ? Changing or stopping your regular medicines. This is especially important if you are taking diabetes medicines or blood thinners (anticoagulants). ? Taking medicines such as aspirin and ibuprofen. These medicines can thin your  blood. Do not take these medicines unless your health care provider tells you to take them. ? Taking over-the-counter medicines, vitamins, herbs, and supplements.  Wear clothing with sleeves that can be raised above the elbow.  Plan to have someone take you home from the hospital or clinic.  You may have a blood sample taken.  Your blood pressure, pulse rate, and breathing rate will be measured. What happens during the procedure?   To lower your risk of infection: ? Your health care team will wash or sanitize their hands. ? Your skin will be cleaned with an antiseptic.  You may be given a medicine to numb the area (local anesthetic).  A tourniquet will be placed on your arm.  A needle will be inserted into one of your veins.  Tubing and a collection bag will be attached to that needle.  Blood will flow through the needle and tubing into the collection bag.  The collection bag will be placed lower than your arm to allow gravity to help the flow of blood into the bag.  You may be asked to open and close your hand slowly and continually during the entire collection.  After the specified amount of blood has been removed from your body, the collection bag and tubing will be clamped.  The needle will be removed from your vein.  Pressure will be held on the site of the needle insertion to stop the bleeding.  A bandage (dressing) will be placed over the needle insertion site. The procedure may vary among health care providers and hospitals. What happens after the procedure?  Your blood pressure, pulse rate, and breathing rate will be   measured after the procedure.  You will be encouraged to drink fluids.  Your recovery will be assessed and monitored.  You can return to your normal activities as told by your health care provider. Summary  Therapeutic phlebotomy is the planned removal of blood from a person's body for the purpose of treating a medical condition.  Therapeutic  phlebotomy may be used to treat hemochromatosis, polycythemia vera, porphyria cutanea tarda, or sickle cell disease.  In the procedure, a needle is inserted and about a pint (470 mL, or 0.47 L) of blood is removed. The average adult has 9-12 pints (4.3-5.7 L) of blood in the body.  This is generally a safe procedure, but it can sometimes cause problems such as nausea, light-headedness, or low blood pressure (hypotension). This information is not intended to replace advice given to you by your health care provider. Make sure you discuss any questions you have with your health care provider. Document Revised: 12/04/2017 Document Reviewed: 12/04/2017 Elsevier Patient Education  2020 [employer]  

## 2020-07-26 NOTE — Progress Notes (Signed)
Hematology and Oncology Follow Up Visit  Natalie Hendrix [id number] 1950/12/15 69 y.o. 07/26/2020   Principle Diagnosis:  Secondary polycythemia vera- JAK2 negative  Current Therapy: Phlebotomy to maintain hematocrit less than 38% Plavix 75 mg by mouth daily   Interim History:  [name] is here today for follow-up. She is feeling itchy, fatigued and has noted increased SOB with activity.  She feels that the numbness and tingling in her fingertips and toes is a little more intense.  Her complexion is ruddy. Hct is 42.3%. She has been working a lot outside with her husband putting in a new porch.  No episodes of bleeding. No petechiae. She does bruise easily on Plavix but not in excess.  No fever, chills, n/v, cough, chest pain, palpitations, abdominal pain or changes in bowel or bladder habits.  No swelling in her extremities at this time. Pedal pulses are 2+. No falls or syncopal episodes to report. She is ambulating with a walking stick for added support.  Her appetite is ok and she is really trying her best to stay well hydrated.   ECOG Performance Status: 1 - Symptomatic but completely ambulatory  Medications:  Allergies as of 07/26/2020      Reactions   Epinephrine Palpitations   Raises heart rate   Flonase [fluticasone] Palpitations   Protriptyline Hcl Other (See Comments)   Severe constipation   Tamiflu [oseltamivir Phosphate] Other (See Comments)   "like I was having a stroke"   Tamiflu [oseltamivir] Anaphylaxis   Tizanidine Other (See Comments)   panic   Neurontin [gabapentin] Other (See Comments)   arthralgia   Other Hives, Rash   boiron acteane   Qvar [beclomethasone] Other (See Comments)   Spiriva [tiotropium Bromide Monohydrate] Rash   Tiotropium Rash   Zonegran Other (See Comments)   Mood swings   Advair Diskus [fluticasone-salmeterol] Rash   Baclofen Rash   Chlorzoxazone Rash   Lamotrigine Rash   Levofloxacin Rash   Protriptyline Rash    Ropinirole Rash   Verapamil Rash      Medication List       Accurate as of July 26, 2020 10:38 AM. If you have any questions, ask your nurse or doctor.        albuterol 108 (90 Base) MCG/ACT inhaler Commonly known as: ProAir HFA Inhale 2 puffs into the lungs every 6 (six) hours as needed for wheezing or shortness of breath.   albuterol (2.5 MG/3ML) 0.083% nebulizer solution Commonly known as: PROVENTIL Take 3 mLs (2.5 mg total) by nebulization every 6 (six) hours as needed for wheezing or shortness of breath.   azelastine 0.1 % nasal spray Commonly known as: ASTELIN Place 1 spray into both nostrils 2 (two) times daily.   beclomethasone 42 MCG/SPRAY nasal spray Commonly known as: BECONASE-AQ 2 puffs each nostril once daily   benzonatate 100 MG capsule Commonly known as: TESSALON Take 1 capsule (100 mg total) by mouth every 6 (six) hours as needed for cough.   buPROPion 300 MG 24 hr tablet Commonly known as: WELLBUTRIN XL Take 1 tablet (300 mg total) by mouth daily with breakfast.   CALCIUM 1200 PO Take 1,250 mg by mouth daily.   Centrum Silver tablet Take 1 tablet by mouth daily.   clopidogrel 75 MG tablet Commonly known as: PLAVIX Take 1 tablet (75 mg total) by mouth daily.   cyclobenzaprine 10 MG tablet Commonly known as: FLEXERIL Take 10 mg by mouth every 8 (eight) hours as needed.   ESTRACE  VAGINAL 0.1 MG/GM vaginal cream Generic drug: estradiol Place 1 Applicatorful vaginally daily. Small amount each dose per pt   ezetimibe 10 MG tablet Commonly known as: Zetia TAKE 1 TABLET BY MOUTH DAILY.   fexofenadine 180 MG tablet Commonly known as: Allegra Allergy Take 1 tablet (180 mg total) by mouth daily.   Fish Oil 1200 MG Caps Take 1,200 mg by mouth at bedtime.   hyoscyamine 0.125 MG Tbdp disintergrating tablet Commonly known as: ANASPAZ Place 1 tablet (0.125 mg total) under the tongue 2 (two) times daily as needed. What changed: reasons to take  this   lidocaine 5 % Commonly known as: LIDODERM Place 1 patch onto the skin as needed (pain). Remove & Discard patch within 12 hours or as directed by MD   LORazepam 1 MG tablet Commonly known as: ATIVAN Take 1 tablet (1 mg total) by mouth 2 (two) times daily. May take extra 3rd dose if needed What changed:   when to take this  reasons to take this   magnesium gluconate 500 MG tablet Commonly known as: MAGONATE Take 500 mg by mouth daily as needed (constipation).   meclizine 25 MG tablet Commonly known as: ANTIVERT Take 1 tablet (25 mg total) by mouth 3 (three) times daily as needed for dizziness. 3 month supply What changed: when to take this   metoprolol succinate 50 MG 24 hr tablet Commonly known as: TOPROL-XL Take 1 tablet (50 mg total) by mouth daily with breakfast. What changed: how much to take   montelukast 10 MG tablet Commonly known as: SINGULAIR 1 daily   morphine 15 MG tablet Commonly known as: MSIR Take 15 mg by mouth every 4 (four) hours as needed for severe pain.   morphine 30 MG 12 hr tablet Commonly known as: MS CONTIN Take 1 tablet by mouth every 12 (twelve) hours.   Nebulizer Devi Use as directed   OXYGEN Inhale 2.5 L/hr into the lungs continuous.   pantoprazole 40 MG tablet Commonly known as: PROTONIX TAKE 1 TABLET (40 MG TOTAL) BY MOUTH DAILY.   pravastatin 40 MG tablet Commonly known as: PRAVACHOL Take 1 tablet (40 mg total) by mouth at bedtime.   promethazine 25 MG tablet Commonly known as: PHENERGAN Take 25 mg by mouth every 8 (eight) hours as needed for nausea or vomiting.   Systane 0.4-0.3 % Soln Generic drug: Polyethyl Glycol-Propyl Glycol Apply 1 drop to eye daily as needed (dry eyes).   TART CHERRY ADVANCED PO Take 5 mLs by mouth daily. Juice Concentrate- 1tsp daily   Zovirax 5 % Generic drug: acyclovir ointment Apply 1 application topically as needed (flair).       Allergies:  Allergies  Allergen Reactions  .  Epinephrine Palpitations    Raises heart rate   . Flonase [Fluticasone] Palpitations  . Protriptyline Hcl Other (See Comments)    Severe constipation  . Tamiflu [Oseltamivir Phosphate] Other (See Comments)    "like I was having a stroke"  . Tamiflu [Oseltamivir] Anaphylaxis  . Tizanidine Other (See Comments)    panic  . Neurontin [Gabapentin] Other (See Comments)    arthralgia  . Other Hives and Rash    boiron acteane  . Qvar [Beclomethasone] Other (See Comments)  . Spiriva [Tiotropium Bromide Monohydrate] Rash  . Tiotropium Rash  . Zonegran Other (See Comments)    Mood swings  . Advair Diskus [Fluticasone-Salmeterol] Rash  . Baclofen Rash  . Chlorzoxazone Rash  . Lamotrigine Rash  . Levofloxacin Rash  .  Protriptyline Rash  . Ropinirole Rash  . Verapamil Rash    Past Medical History, Surgical history, Social history, and Family History were reviewed and updated.  Review of Systems: All other 10 point review of systems is negative.   Physical Exam:  weight is 197 lb 12 oz (89.7 kg). Her oral temperature is 98.4 F (36.9 C). Her blood pressure is 121/56 (abnormal) and her pulse is 79. Her respiration is 18 and oxygen saturation is 98%.   Wt Readings from Last 3 Encounters:  07/26/20 197 lb 12 oz (89.7 kg)  05/30/20 197 lb (89.4 kg)  04/24/20 201 lb 9.6 oz (91.4 kg)    Ocular: Sclerae unicteric, pupils equal, round and reactive to light Ear-nose-throat: Oropharynx clear, dentition fair Lymphatic: No cervical or supraclavicular adenopathy Lungs no rales or rhonchi, good excursion bilaterally Heart regular rate and rhythm, no murmur appreciated Abd soft, nontender, positive bowel sounds, no liver or spleen tip palpated on exam, no fluid wave  MSK no focal spinal tenderness, no joint edema Neuro: non-focal, well-oriented, appropriate affect Breasts: Deferred   Lab Results  Component Value Date   WBC 5.4 07/26/2020   HGB 12.8 07/26/2020   HCT 42.3 07/26/2020   MCV  78.0 (L) 07/26/2020   PLT 249 07/26/2020   Lab Results  Component Value Date   FERRITIN 5 (L) 05/30/2020   IRON 55 05/30/2020   TIBC 470 (H) 05/30/2020   UIBC 415 (H) 05/30/2020   IRONPCTSAT 12 (L) 05/30/2020   Lab Results  Component Value Date   RETICCTPCT 1.1 04/27/2015   RBC 5.42 (H) 07/26/2020   RETICCTABS 55.1 04/27/2015   No results found for: KPAFRELGTCHN, LAMBDASER, KAPLAMBRATIO No results found for: IGGSERUM, IGA, IGMSERUM No results found for: Odetta Pink, SPEI   Chemistry      Component Value Date/Time   NA 140 07/26/2020 0958   NA 147 (H) 10/08/2017 1410   NA 141 02/17/2017 1132   K 4.4 07/26/2020 0958   K 4.6 10/08/2017 1410   K 5.3 (H) 02/17/2017 1132   CL 104 07/26/2020 0958   CL 106 10/08/2017 1410   CO2 32 07/26/2020 0958   CO2 30 10/08/2017 1410   CO2 29 02/17/2017 1132   BUN 19 07/26/2020 0958   BUN 15 10/08/2017 1410   BUN 17.8 02/17/2017 1132   CREATININE 0.83 07/26/2020 0958   CREATININE 1.0 10/08/2017 1410   CREATININE 0.8 02/17/2017 1132      Component Value Date/Time   CALCIUM 9.5 07/26/2020 0958   CALCIUM 9.0 10/08/2017 1410   CALCIUM 9.3 02/17/2017 1132   ALKPHOS 68 07/26/2020 0958   ALKPHOS 59 10/08/2017 1410   ALKPHOS 79 02/17/2017 1132   AST 21 07/26/2020 0958   AST 22 02/17/2017 1132   ALT 18 07/26/2020 0958   ALT 26 10/08/2017 1410   ALT 20 02/17/2017 1132   BILITOT 0.4 07/26/2020 0958   BILITOT 0.39 02/17/2017 1132       Impression and Plan: Ms. Pilling is a very pleasant 69yo caucasian female with secondary polycythemia (JAK-2 negative) due to COPD. We will proceed with phlebotomy today for Hct 42.3%. We will plan to see her again in 6 weeks.  She can contact our office with any questions or concerns.   [name], NP 8/25/202110:38 AM

## 2020-07-26 NOTE — Progress Notes (Signed)
Natalie Hendrix presents today for phlebotomy per MD orders. Phlebotomy procedure started at 1130 and ended at 1152. 505 grams removed via 22 gauge needle to Left AC. Patient observed for 30 minutes after procedure without any incident. Patient tolerated procedure well.

## 2020-07-31 DIAGNOSIS — M791 Myalgia, unspecified site: Secondary | ICD-10-CM | POA: Diagnosis not present

## 2020-07-31 DIAGNOSIS — S335XXS Sprain of ligaments of lumbar spine, sequela: Secondary | ICD-10-CM | POA: Diagnosis not present

## 2020-07-31 DIAGNOSIS — S134XXS Sprain of ligaments of cervical spine, sequela: Secondary | ICD-10-CM | POA: Diagnosis not present

## 2020-07-31 DIAGNOSIS — G894 Chronic pain syndrome: Secondary | ICD-10-CM | POA: Diagnosis not present

## 2020-08-01 ENCOUNTER — Telehealth: Payer: Self-pay | Admitting: Internal Medicine

## 2020-08-05 IMAGING — MR MR THORACIC SPINE W/O CM
4 of 6 series · 18 of 48 positions shown · non-contrast
Comparison: Thoracic MRI [DATE] 02/01/2009

CLINICAL DATA: Thoracic back pain with left chest pain

EXAM:
MRI THORACIC SPINE WITHOUT CONTRAST
TECHNIQUE: Multiplanar, multisequence MR imaging of the thoracic spine was
performed. No intravenous contrast was administered.

[Series 4: T2 · sagittal · 4.0mm · 0.50mm/px · 5 of 17 slices shown (1 of 3)]
[im 1/17]
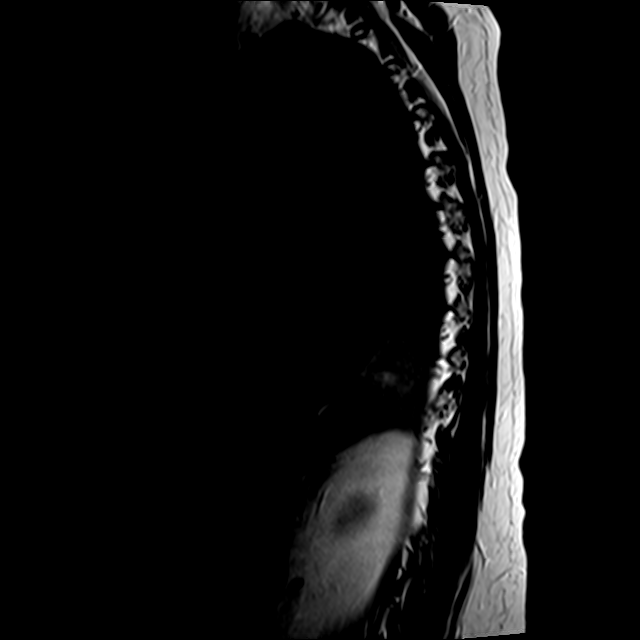
[im 5/17]
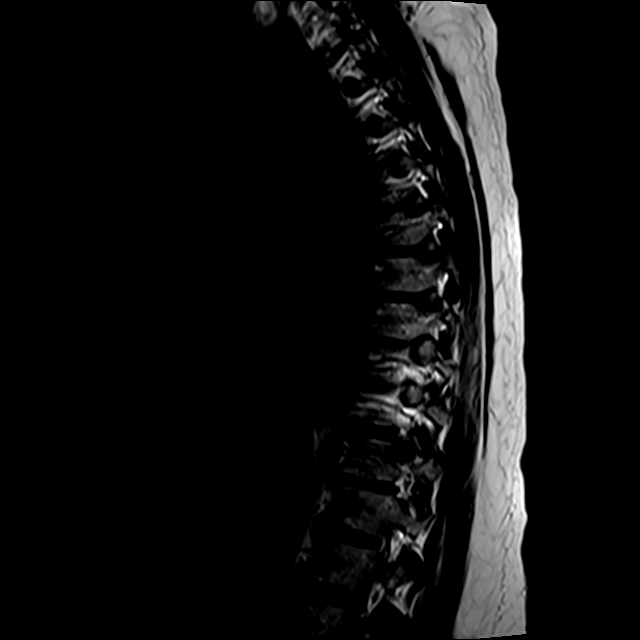
[im 9/17]
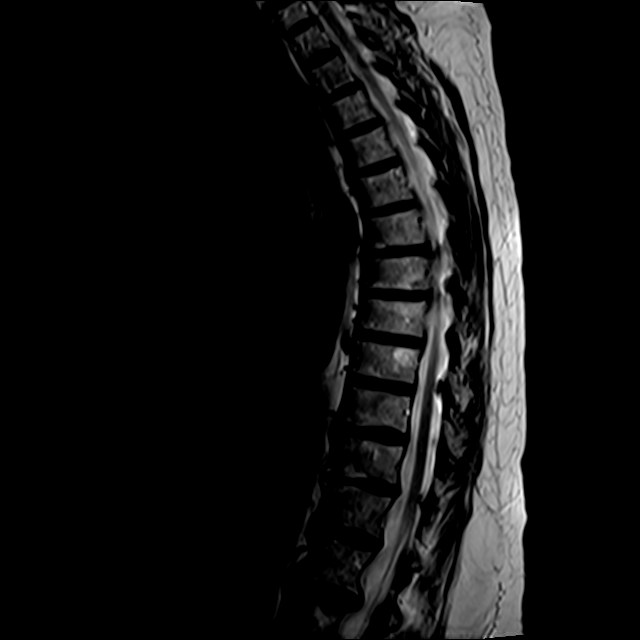
[im 13/17]
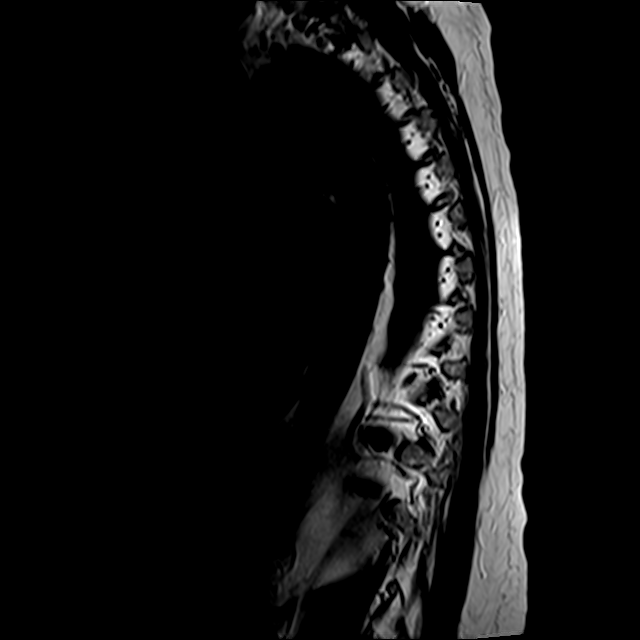
[im 17/17]
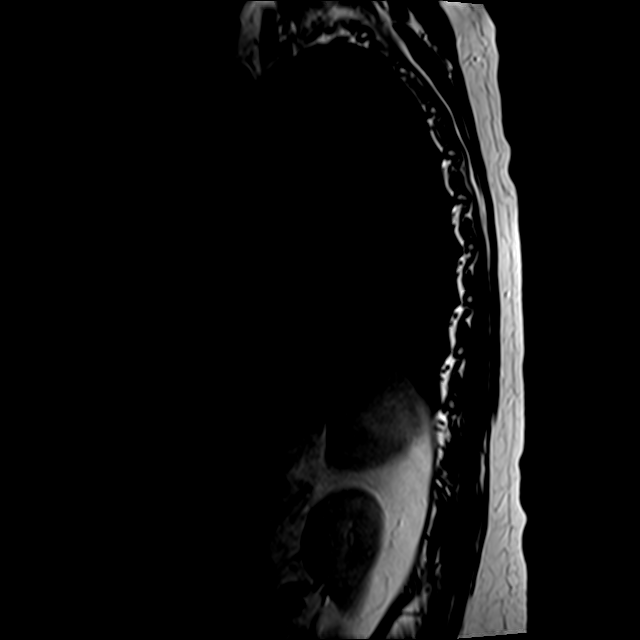

[Series 6: T1 · sagittal · 4.0mm · 1.00mm/px · 3 of 17 slices shown]
[im 4/17]
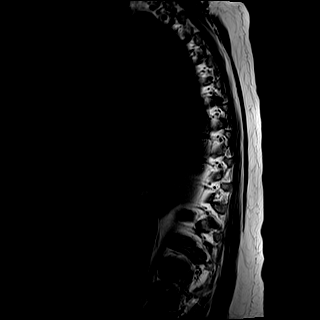
[im 10/17]
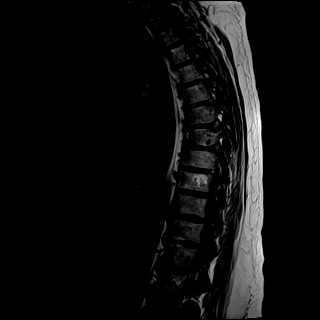
[im 17/17]
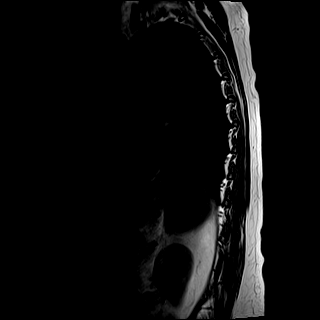

[Series 7: T2 · axial · 4.0mm · 0.39mm/px · z∈[-216,-53]mm · 7 of 37 slices shown (2 of 3)]
[im 1/37]
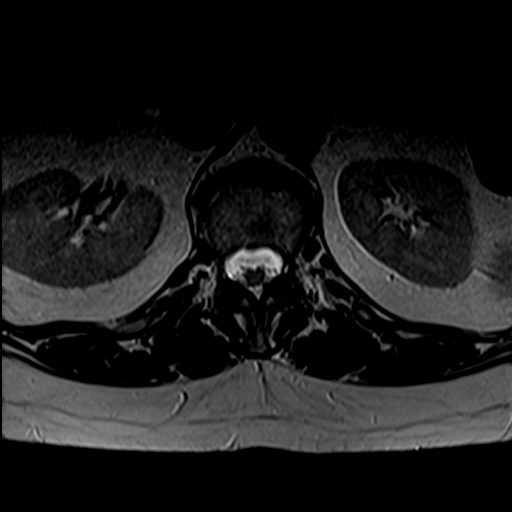
[im 7/37]
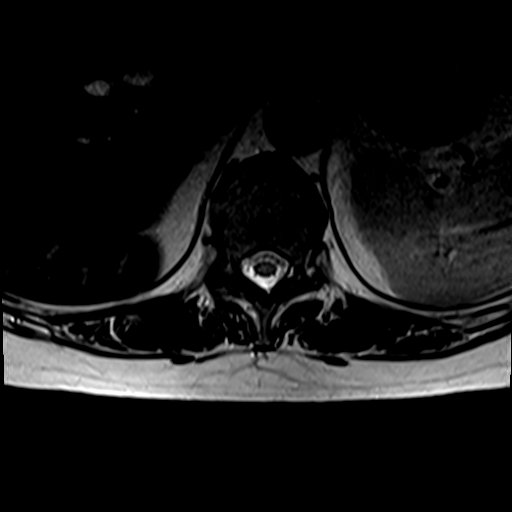
[im 13/37]
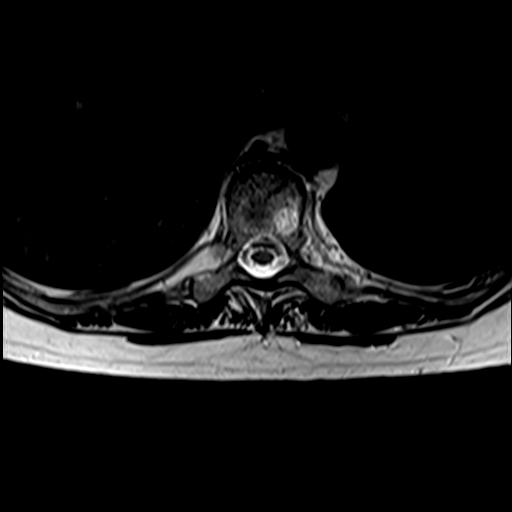
[im 16/37]
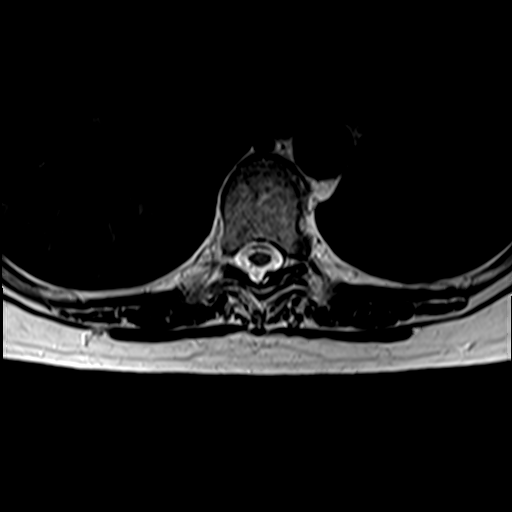
[im 19/37]
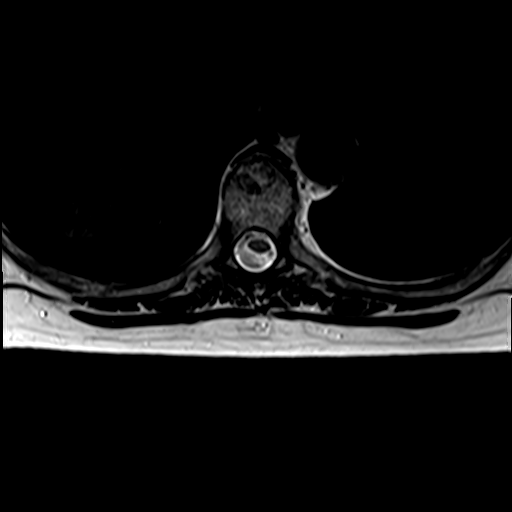
[im 22/37]
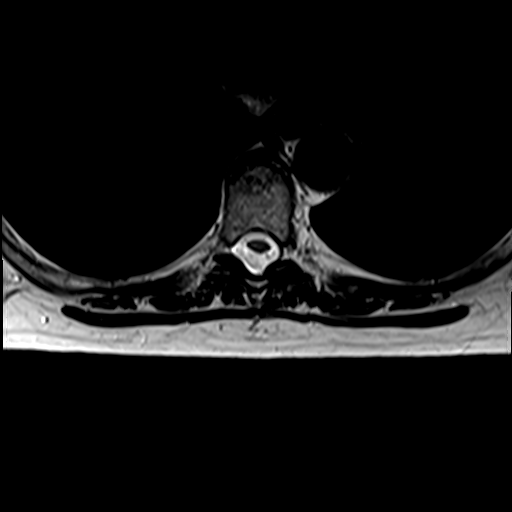
[im 31/37]
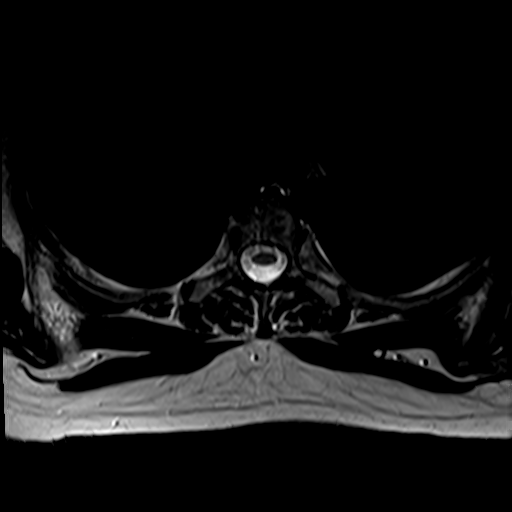

[Series 8: T2 · axial · 4.0mm · 0.39mm/px · z∈[-175,-53]mm · 3 of 37 slices shown (3 of 3)]
[im 7/37]
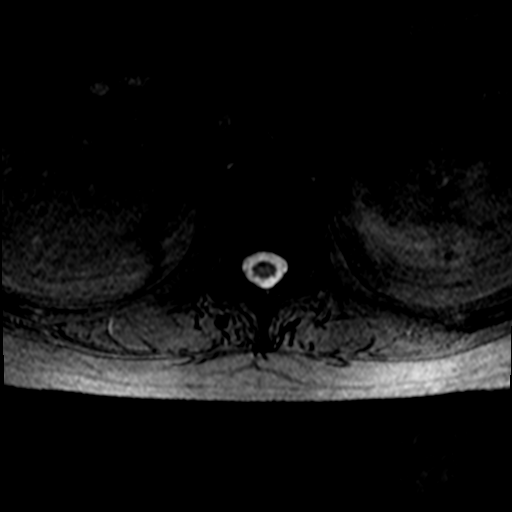
[im 19/37]
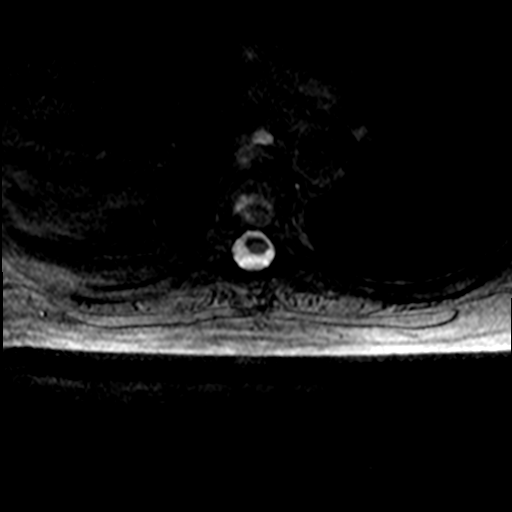
[im 31/37]
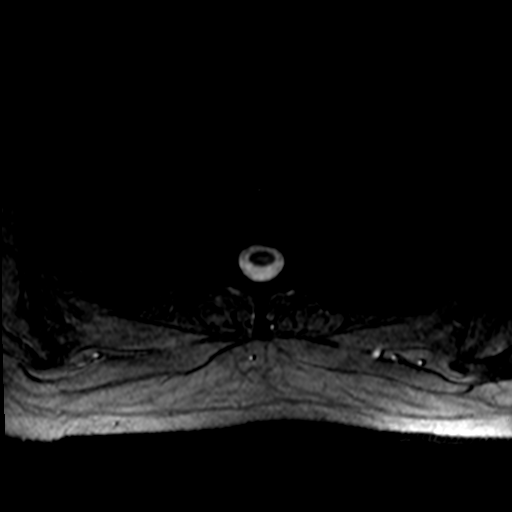

[18 of 48 positions shown; findings below may reference images not displayed]

FINDINGS: Alignment:  Normal

Vertebrae: Negative for fracture. Hemangioma left T9 vertebral body
unchanged. 11 mm T1 and T2 hypointense lesion on the right at T9
vertebral body also unchanged from the prior study. Hemangioma T5
vertebral body unchanged

Cord:  Normal cord signal.  No cord compression or cord lesion.

Paraspinal and other soft tissues: Negative for paraspinous mass or
adenopathy

Disc levels:

Mild disc degeneration throughout the thoracic spine with disc
desiccation and mild disc space narrowing and bulging. No focal disc
protrusion identified.

Endplate spurring on the left at T9-10 with associated facet
hypertrophy. Mild left foraminal narrowing.

Right foraminal narrowing T10-11 due to endplate spurring and facet
hypertrophy bilaterally

Disc and facet degeneration T11-12 without significant spinal
stenosis.
IMPRESSION: Negative for fracture or mass. Stable indeterminate lesion T9
vertebral body, benign based on stability for 10 years.

Progressive disc and facet degeneration in the thoracic spine. Mild
left foraminal narrowing at T9-10 due to spurring. Mild right
foraminal narrowing T10-11 due to spurring. No significant cord
deformity or spinal stenosis.

## 2020-08-10 ENCOUNTER — Telehealth: Payer: Self-pay | Admitting: *Deleted

## 2020-08-10 NOTE — Telephone Encounter (Signed)
This nurse returned patient's phone call but it went straight to voicemail. I left a message for her to call the office back.

## 2020-08-23 NOTE — Telephone Encounter (Signed)
Natalie Hendrix, please advise if this was taken care of.

## 2020-08-24 DIAGNOSIS — R0902 Hypoxemia: Secondary | ICD-10-CM | POA: Diagnosis not present

## 2020-08-24 DIAGNOSIS — I639 Cerebral infarction, unspecified: Secondary | ICD-10-CM | POA: Diagnosis not present

## 2020-08-24 DIAGNOSIS — D45 Polycythemia vera: Secondary | ICD-10-CM | POA: Diagnosis not present

## 2020-08-24 DIAGNOSIS — E785 Hyperlipidemia, unspecified: Secondary | ICD-10-CM | POA: Diagnosis not present

## 2020-08-31 DIAGNOSIS — G894 Chronic pain syndrome: Secondary | ICD-10-CM | POA: Diagnosis not present

## 2020-08-31 DIAGNOSIS — M62838 Other muscle spasm: Secondary | ICD-10-CM | POA: Diagnosis not present

## 2020-08-31 DIAGNOSIS — S335XXS Sprain of ligaments of lumbar spine, sequela: Secondary | ICD-10-CM | POA: Diagnosis not present

## 2020-08-31 DIAGNOSIS — R51 Headache with orthostatic component, not elsewhere classified: Secondary | ICD-10-CM | POA: Diagnosis not present

## 2020-08-31 DIAGNOSIS — S134XXS Sprain of ligaments of cervical spine, sequela: Secondary | ICD-10-CM | POA: Diagnosis not present

## 2020-08-31 DIAGNOSIS — M791 Myalgia, unspecified site: Secondary | ICD-10-CM | POA: Diagnosis not present

## 2020-08-31 DIAGNOSIS — M542 Cervicalgia: Secondary | ICD-10-CM | POA: Diagnosis not present

## 2020-09-06 ENCOUNTER — Telehealth: Payer: Self-pay | Admitting: Hematology & Oncology

## 2020-09-06 ENCOUNTER — Inpatient Hospital Stay (HOSPITAL_BASED_OUTPATIENT_CLINIC_OR_DEPARTMENT_OTHER): Payer: Medicare HMO | Admitting: Family

## 2020-09-06 ENCOUNTER — Inpatient Hospital Stay: Payer: Medicare HMO

## 2020-09-06 ENCOUNTER — Inpatient Hospital Stay: Payer: Medicare HMO | Attending: Hematology & Oncology

## 2020-09-06 ENCOUNTER — Encounter: Payer: Self-pay | Admitting: Family

## 2020-09-06 ENCOUNTER — Other Ambulatory Visit: Payer: Self-pay

## 2020-09-06 ENCOUNTER — Telehealth: Payer: Self-pay | Admitting: Family

## 2020-09-06 VITALS — BP 108/60 | HR 81 | Temp 99.0°F | Resp 18 | Ht 67.0 in | Wt 200.1 lb

## 2020-09-06 DIAGNOSIS — J449 Chronic obstructive pulmonary disease, unspecified: Secondary | ICD-10-CM | POA: Insufficient documentation

## 2020-09-06 DIAGNOSIS — D751 Secondary polycythemia: Secondary | ICD-10-CM | POA: Insufficient documentation

## 2020-09-06 DIAGNOSIS — Z7902 Long term (current) use of antithrombotics/antiplatelets: Secondary | ICD-10-CM | POA: Diagnosis not present

## 2020-09-06 DIAGNOSIS — D45 Polycythemia vera: Secondary | ICD-10-CM | POA: Diagnosis not present

## 2020-09-06 DIAGNOSIS — D500 Iron deficiency anemia secondary to blood loss (chronic): Secondary | ICD-10-CM

## 2020-09-06 DIAGNOSIS — M79643 Pain in unspecified hand: Secondary | ICD-10-CM | POA: Insufficient documentation

## 2020-09-06 DIAGNOSIS — R002 Palpitations: Secondary | ICD-10-CM | POA: Diagnosis not present

## 2020-09-06 DIAGNOSIS — R5383 Other fatigue: Secondary | ICD-10-CM | POA: Diagnosis not present

## 2020-09-06 LAB — CBC WITH DIFFERENTIAL (CANCER CENTER ONLY)
Abs Immature Granulocytes: 0.02 10*3/uL (ref 0.00–0.07)
Basophils Absolute: 0.1 10*3/uL (ref 0.0–0.1)
Basophils Relative: 1 %
Eosinophils Absolute: 0.2 10*3/uL (ref 0.0–0.5)
Eosinophils Relative: 3 %
HCT: 38.2 % (ref 36.0–46.0)
Hemoglobin: 11.5 g/dL — ABNORMAL LOW (ref 12.0–15.0)
Immature Granulocytes: 0 %
Lymphocytes Relative: 26 %
Lymphs Abs: 1.6 10*3/uL (ref 0.7–4.0)
MCH: 23.3 pg — ABNORMAL LOW (ref 26.0–34.0)
MCHC: 30.1 g/dL (ref 30.0–36.0)
MCV: 77.3 fL — ABNORMAL LOW (ref 80.0–100.0)
Monocytes Absolute: 0.8 10*3/uL (ref 0.1–1.0)
Monocytes Relative: 13 %
Neutro Abs: 3.3 10*3/uL (ref 1.7–7.7)
Neutrophils Relative %: 57 %
Platelet Count: 269 10*3/uL (ref 150–400)
RBC: 4.94 MIL/uL (ref 3.87–5.11)
RDW: 15 % (ref 11.5–15.5)
WBC Count: 5.9 10*3/uL (ref 4.0–10.5)
nRBC: 0 % (ref 0.0–0.2)

## 2020-09-06 LAB — CMP (CANCER CENTER ONLY)
ALT: 16 U/L (ref 0–44)
AST: 23 U/L (ref 15–41)
Albumin: 4.2 g/dL (ref 3.5–5.0)
Alkaline Phosphatase: 64 U/L (ref 38–126)
Anion gap: 4 — ABNORMAL LOW (ref 5–15)
BUN: 16 mg/dL (ref 8–23)
CO2: 32 mmol/L (ref 22–32)
Calcium: 9.6 mg/dL (ref 8.9–10.3)
Chloride: 103 mmol/L (ref 98–111)
Creatinine: 0.83 mg/dL (ref 0.44–1.00)
GFR, Estimated: >60 mL/min (ref >60)
Glucose, Bld: 105 mg/dL — ABNORMAL HIGH (ref 70–99)
Potassium: 4.5 mmol/L (ref 3.5–5.1)
Sodium: 139 mmol/L (ref 135–145)
Total Bilirubin: 0.4 mg/dL (ref 0.3–1.2)
Total Protein: 6.6 g/dL (ref 6.5–8.1)

## 2020-09-06 NOTE — Telephone Encounter (Signed)
Appointments scheduled calendar printed per 10/6 los

## 2020-09-06 NOTE — Progress Notes (Signed)
Hematology and Oncology Follow Up Visit  Natalie Hendrix [id number] 1951/06/24 69 y.o. 09/06/2020   Principle Diagnosis:  Secondary polycythemia vera- JAK2 negative  Current Therapy: Phlebotomy to maintain hematocrit less than 38% Plavix 75 mg by mouth daily   Interim History:  Natalie Hendrix is here today for follow-up. She is having fatigue, increase in her migraines and joint ached and pains particularly in her back and hands.  Hct is 38.2%.  She had her covid booster recently and states that this exacerbated her fatigue.  She is on 2L [place] 24 hours a day and SOB is fairly stable at this time.  She has occasional palpitations and angina with over exertion.  She has occasional left upper quadrant discomfort. This comes and goes.  She has itching and states that her PCP gave her Hydroxyzine which seems to help.  No fever, chills, n/v, cough, rash, dizziness or changes in bowel or bladder habits.  No swelling or tenderness in her extremities at this time.  She has occasional numbness in her fingertips.  No falls or syncopal episodes to report.  No episodes of bleeding. No abnormal bruising, no petechiae.  She has maintained a good appetite and is staying well hydrated. Her weight is stable.   ECOG Performance Status: 1 - Symptomatic but completely ambulatory  Medications:  Allergies as of 09/06/2020      Reactions   Epinephrine Palpitations   Raises heart rate   Flonase [fluticasone] Palpitations   Protriptyline Hcl Other (See Comments)   Severe constipation   Tamiflu [oseltamivir Phosphate] Other (See Comments)   "like I was having a stroke"   Tamiflu [oseltamivir] Anaphylaxis   Tizanidine Other (See Comments)   panic   Neurontin [gabapentin] Other (See Comments)   arthralgia   Other Hives, Rash   boiron acteane   Qvar [beclomethasone] Other (See Comments)   Spiriva [tiotropium Bromide Monohydrate] Rash   Tiotropium Rash   Zonegran Other (See Comments)   Mood swings    Advair Diskus [fluticasone-salmeterol] Rash   Baclofen Rash   Chlorzoxazone Rash   Lamotrigine Rash   Levofloxacin Rash   Protriptyline Rash   Ropinirole Rash   Verapamil Rash      Medication List       Accurate as of September 06, 2020 10:26 AM. If you have any questions, ask your nurse or doctor.        albuterol 108 (90 Base) MCG/ACT inhaler Commonly known as: ProAir HFA Inhale 2 puffs into the lungs every 6 (six) hours as needed for wheezing or shortness of breath.   albuterol (2.5 MG/3ML) 0.083% nebulizer solution Commonly known as: PROVENTIL Take 3 mLs (2.5 mg total) by nebulization every 6 (six) hours as needed for wheezing or shortness of breath.   azelastine 0.1 % nasal spray Commonly known as: ASTELIN Place 1 spray into both nostrils 2 (two) times daily.   beclomethasone 42 MCG/SPRAY nasal spray Commonly known as: BECONASE-AQ 2 puffs each nostril once daily   benzonatate 100 MG capsule Commonly known as: TESSALON Take 1 capsule (100 mg total) by mouth every 6 (six) hours as needed for cough.   buPROPion 300 MG 24 hr tablet Commonly known as: WELLBUTRIN XL Take 1 tablet (300 mg total) by mouth daily with breakfast.   CALCIUM 1200 PO Take 1,250 mg by mouth daily.   Centrum Silver tablet Take 1 tablet by mouth daily.   clopidogrel 75 MG tablet Commonly known as: PLAVIX Take 1 tablet (75 mg total)  by mouth daily.   cyclobenzaprine 10 MG tablet Commonly known as: FLEXERIL Take 10 mg by mouth every 8 (eight) hours as needed.   ESTRACE VAGINAL 0.1 MG/GM vaginal cream Generic drug: estradiol Place 1 Applicatorful vaginally daily. Small amount each dose per pt   ezetimibe 10 MG tablet Commonly known as: Zetia TAKE 1 TABLET BY MOUTH DAILY.   fexofenadine 180 MG tablet Commonly known as: Allegra Allergy Take 1 tablet (180 mg total) by mouth daily.   Fish Oil 1200 MG Caps Take 1,200 mg by mouth at bedtime.   hyoscyamine 0.125 MG Tbdp disintergrating  tablet Commonly known as: ANASPAZ Place 1 tablet (0.125 mg total) under the tongue 2 (two) times daily as needed. What changed: reasons to take this   lidocaine 5 % Commonly known as: LIDODERM Place 1 patch onto the skin as needed (pain). Remove & Discard patch within 12 hours or as directed by MD   LORazepam 1 MG tablet Commonly known as: ATIVAN Take 1 tablet (1 mg total) by mouth 2 (two) times daily. May take extra 3rd dose if needed What changed:   when to take this  reasons to take this   magnesium gluconate 500 MG tablet Commonly known as: MAGONATE Take 500 mg by mouth daily as needed (constipation).   meclizine 25 MG tablet Commonly known as: ANTIVERT Take 1 tablet (25 mg total) by mouth 3 (three) times daily as needed for dizziness. 3 month supply What changed: when to take this   metoprolol succinate 50 MG 24 hr tablet Commonly known as: TOPROL-XL Take 1 tablet (50 mg total) by mouth daily with breakfast. What changed: how much to take   montelukast 10 MG tablet Commonly known as: SINGULAIR 1 daily   morphine 15 MG tablet Commonly known as: MSIR Take 15 mg by mouth every 4 (four) hours as needed for severe pain.   morphine 30 MG 12 hr tablet Commonly known as: MS CONTIN Take 1 tablet by mouth every 12 (twelve) hours.   Nebulizer Devi Use as directed   OXYGEN Inhale 2.5 L/hr into the lungs continuous.   pantoprazole 40 MG tablet Commonly known as: PROTONIX TAKE 1 TABLET (40 MG TOTAL) BY MOUTH DAILY.   pravastatin 40 MG tablet Commonly known as: PRAVACHOL Take 1 tablet (40 mg total) by mouth at bedtime.   promethazine 25 MG tablet Commonly known as: PHENERGAN Take 25 mg by mouth every 8 (eight) hours as needed for nausea or vomiting.   Systane 0.4-0.3 % Soln Generic drug: Polyethyl Glycol-Propyl Glycol Apply 1 drop to eye daily as needed (dry eyes).   TART CHERRY ADVANCED PO Take 5 mLs by mouth daily. Juice Concentrate- 1tsp daily   Zovirax 5  % Generic drug: acyclovir ointment Apply 1 application topically as needed (flair).       Allergies:  Allergies  Allergen Reactions  . Epinephrine Palpitations    Raises heart rate   . Flonase [Fluticasone] Palpitations  . Protriptyline Hcl Other (See Comments)    Severe constipation  . Tamiflu [Oseltamivir Phosphate] Other (See Comments)    "like I was having a stroke"  . Tamiflu [Oseltamivir] Anaphylaxis  . Tizanidine Other (See Comments)    panic  . Neurontin [Gabapentin] Other (See Comments)    arthralgia  . Other Hives and Rash    boiron acteane  . Qvar [Beclomethasone] Other (See Comments)  . Spiriva [Tiotropium Bromide Monohydrate] Rash  . Tiotropium Rash  . Zonegran Other (See Comments)  Mood swings  . Advair Diskus [Fluticasone-Salmeterol] Rash  . Baclofen Rash  . Chlorzoxazone Rash  . Lamotrigine Rash  . Levofloxacin Rash  . Protriptyline Rash  . Ropinirole Rash  . Verapamil Rash    Past Medical History, Surgical history, Social history, and Family History were reviewed and updated.  Review of Systems: All other 10 point review of systems is negative.   Physical Exam:  vitals were not taken for this visit.   Wt Readings from Last 3 Encounters:  07/26/20 197 lb 12 oz (89.7 kg)  05/30/20 197 lb (89.4 kg)  04/24/20 201 lb 9.6 oz (91.4 kg)    Ocular: Sclerae unicteric, pupils equal, round and reactive to light Ear-nose-throat: Oropharynx clear, dentition fair Lymphatic: No cervical or supraclavicular adenopathy Lungs no rales or rhonchi, good excursion bilaterally Heart regular rate and rhythm, no murmur appreciated Abd soft, nontender, positive bowel sounds MSK no focal spinal tenderness, no joint edema Neuro: non-focal, well-oriented, appropriate affect Breasts: Deferred   Lab Results  Component Value Date   WBC 5.9 09/06/2020   HGB 11.5 (L) 09/06/2020   HCT 38.2 09/06/2020   MCV 77.3 (L) 09/06/2020   PLT 269 09/06/2020   Lab Results    Component Value Date   FERRITIN <4 (L) 07/26/2020   IRON 47 07/26/2020   TIBC 483 (H) 07/26/2020   UIBC 435 (H) 07/26/2020   IRONPCTSAT 10 (L) 07/26/2020   Lab Results  Component Value Date   RETICCTPCT 1.1 04/27/2015   RBC 4.94 09/06/2020   RETICCTABS 55.1 04/27/2015   No results found for: KPAFRELGTCHN, LAMBDASER, KAPLAMBRATIO No results found for: IGGSERUM, IGA, IGMSERUM No results found for: Odetta Pink, SPEI   Chemistry      Component Value Date/Time   NA 140 07/26/2020 0958   NA 147 (H) 10/08/2017 1410   NA 141 02/17/2017 1132   K 4.4 07/26/2020 0958   K 4.6 10/08/2017 1410   K 5.3 (H) 02/17/2017 1132   CL 104 07/26/2020 0958   CL 106 10/08/2017 1410   CO2 32 07/26/2020 0958   CO2 30 10/08/2017 1410   CO2 29 02/17/2017 1132   BUN 19 07/26/2020 0958   BUN 15 10/08/2017 1410   BUN 17.8 02/17/2017 1132   CREATININE 0.83 07/26/2020 0958   CREATININE 1.0 10/08/2017 1410   CREATININE 0.8 02/17/2017 1132      Component Value Date/Time   CALCIUM 9.5 07/26/2020 0958   CALCIUM 9.0 10/08/2017 1410   CALCIUM 9.3 02/17/2017 1132   ALKPHOS 68 07/26/2020 0958   ALKPHOS 59 10/08/2017 1410   ALKPHOS 79 02/17/2017 1132   AST 21 07/26/2020 0958   AST 22 02/17/2017 1132   ALT 18 07/26/2020 0958   ALT 26 10/08/2017 1410   ALT 20 02/17/2017 1132   BILITOT 0.4 07/26/2020 0958   BILITOT 0.39 02/17/2017 1132       Impression and Plan: Ms. Harral is a very pleasant 69yo caucasian female with secondary polycythemia (JAK-2 negative) due to COPD. We will proceed with partial phlebotomy (250 ml) followed by replacement fluids.  She is in agreement with the plan.  Follow-up in 1 month.  She can contact our office with any questions or concerns.   [name], NP 10/6/202110:26 AM

## 2020-09-06 NOTE — Progress Notes (Signed)
Good iv access right and left arms, with excellent blood return and then would not bleed, nor were we able to aspirate. . Gave 250 of normal saline after the first iv stick. Still no blood return.per Judson Roch NP, patient may reschedule in a few days to try again. Given refreshments and patient tolerated well.

## 2020-09-07 LAB — IRON AND TIBC
Iron: 37 ug/dL — ABNORMAL LOW (ref 41–142)
Saturation Ratios: 7 % — ABNORMAL LOW (ref 21–57)
TIBC: 491 ug/dL — ABNORMAL HIGH (ref 236–444)
UIBC: 455 ug/dL — ABNORMAL HIGH (ref 120–384)

## 2020-09-07 LAB — FERRITIN: Ferritin: 4 ng/mL — ABNORMAL LOW (ref 11–307)

## 2020-09-11 DIAGNOSIS — M79644 Pain in right finger(s): Secondary | ICD-10-CM | POA: Diagnosis not present

## 2020-09-11 DIAGNOSIS — M79645 Pain in left finger(s): Secondary | ICD-10-CM | POA: Diagnosis not present

## 2020-09-22 ENCOUNTER — Telehealth: Payer: Self-pay | Admitting: Internal Medicine

## 2020-09-22 ENCOUNTER — Other Ambulatory Visit: Payer: Self-pay

## 2020-09-22 DIAGNOSIS — Z8673 Personal history of transient ischemic attack (TIA), and cerebral infarction without residual deficits: Secondary | ICD-10-CM

## 2020-09-22 DIAGNOSIS — D45 Polycythemia vera: Secondary | ICD-10-CM

## 2020-09-22 MED ORDER — BENZONATATE 100 MG PO CAPS: 100.0000 mg | ORAL_CAPSULE | Freq: Four times a day (QID) | ORAL | 3 refills | Status: DC | PRN

## 2020-09-22 MED ORDER — BECLOMETHASONE DIPROP MONOHYD 42 MCG/SPRAY NA SUSP: NASAL | 3 refills | Status: DC

## 2020-09-22 MED ORDER — CLOPIDOGREL BISULFATE 75 MG PO TABS: 75.0000 mg | ORAL_TABLET | Freq: Every day | ORAL | 1 refills | Status: DC

## 2020-09-22 MED ORDER — AZELASTINE HCL 0.1 % NA SOLN: 1.0000 | Freq: Two times a day (BID) | NASAL | 3 refills | Status: DC

## 2020-09-22 NOTE — Telephone Encounter (Signed)
meds have been refilled for pt called and spoke with pt letting her know this had been done and she verbalized understanding. Nothing further needed.

## 2020-09-23 DIAGNOSIS — I639 Cerebral infarction, unspecified: Secondary | ICD-10-CM | POA: Diagnosis not present

## 2020-09-23 DIAGNOSIS — E785 Hyperlipidemia, unspecified: Secondary | ICD-10-CM | POA: Diagnosis not present

## 2020-09-23 DIAGNOSIS — R0902 Hypoxemia: Secondary | ICD-10-CM | POA: Diagnosis not present

## 2020-09-23 DIAGNOSIS — D45 Polycythemia vera: Secondary | ICD-10-CM | POA: Diagnosis not present

## 2020-10-03 DIAGNOSIS — R0789 Other chest pain: Secondary | ICD-10-CM | POA: Diagnosis not present

## 2020-10-03 DIAGNOSIS — R69 Illness, unspecified: Secondary | ICD-10-CM | POA: Diagnosis not present

## 2020-10-03 DIAGNOSIS — J449 Chronic obstructive pulmonary disease, unspecified: Secondary | ICD-10-CM | POA: Diagnosis not present

## 2020-10-03 DIAGNOSIS — I10 Essential (primary) hypertension: Secondary | ICD-10-CM | POA: Diagnosis not present

## 2020-10-06 ENCOUNTER — Inpatient Hospital Stay (HOSPITAL_BASED_OUTPATIENT_CLINIC_OR_DEPARTMENT_OTHER): Payer: Medicare HMO | Admitting: Family

## 2020-10-06 ENCOUNTER — Inpatient Hospital Stay: Payer: Medicare HMO | Attending: Hematology & Oncology

## 2020-10-06 ENCOUNTER — Inpatient Hospital Stay: Payer: Medicare HMO

## 2020-10-06 ENCOUNTER — Other Ambulatory Visit: Payer: Self-pay

## 2020-10-06 VITALS — BP 111/48 | HR 70 | Resp 17

## 2020-10-06 VITALS — BP 137/81 | HR 90 | Temp 98.9°F | Resp 17 | Wt 205.0 lb

## 2020-10-06 DIAGNOSIS — D751 Secondary polycythemia: Secondary | ICD-10-CM

## 2020-10-06 DIAGNOSIS — D45 Polycythemia vera: Secondary | ICD-10-CM | POA: Diagnosis not present

## 2020-10-06 DIAGNOSIS — D500 Iron deficiency anemia secondary to blood loss (chronic): Secondary | ICD-10-CM

## 2020-10-06 DIAGNOSIS — E611 Iron deficiency: Secondary | ICD-10-CM | POA: Insufficient documentation

## 2020-10-06 LAB — CBC WITH DIFFERENTIAL (CANCER CENTER ONLY)
Abs Immature Granulocytes: 0.01 10*3/uL (ref 0.00–0.07)
Basophils Absolute: 0 10*3/uL (ref 0.0–0.1)
Basophils Relative: 1 %
Eosinophils Absolute: 0.1 10*3/uL (ref 0.0–0.5)
Eosinophils Relative: 3 %
HCT: 39.1 % (ref 36.0–46.0)
Hemoglobin: 11.6 g/dL — ABNORMAL LOW (ref 12.0–15.0)
Immature Granulocytes: 0 %
Lymphocytes Relative: 29 %
Lymphs Abs: 1.5 10*3/uL (ref 0.7–4.0)
MCH: 22.7 pg — ABNORMAL LOW (ref 26.0–34.0)
MCHC: 29.7 g/dL — ABNORMAL LOW (ref 30.0–36.0)
MCV: 76.5 fL — ABNORMAL LOW (ref 80.0–100.0)
Monocytes Absolute: 0.7 10*3/uL (ref 0.1–1.0)
Monocytes Relative: 13 %
Neutro Abs: 2.8 10*3/uL (ref 1.7–7.7)
Neutrophils Relative %: 54 %
Platelet Count: 265 10*3/uL (ref 150–400)
RBC: 5.11 MIL/uL (ref 3.87–5.11)
RDW: 16.1 % — ABNORMAL HIGH (ref 11.5–15.5)
WBC Count: 5.1 10*3/uL (ref 4.0–10.5)
nRBC: 0 % (ref 0.0–0.2)

## 2020-10-06 LAB — CMP (CANCER CENTER ONLY)
ALT: 14 U/L (ref 0–44)
AST: 19 U/L (ref 15–41)
Albumin: 4 g/dL (ref 3.5–5.0)
Alkaline Phosphatase: 80 U/L (ref 38–126)
Anion gap: 6 (ref 5–15)
BUN: 12 mg/dL (ref 8–23)
CO2: 29 mmol/L (ref 22–32)
Calcium: 9.4 mg/dL (ref 8.9–10.3)
Chloride: 106 mmol/L (ref 98–111)
Creatinine: 0.84 mg/dL (ref 0.44–1.00)
GFR, Estimated: >60 mL/min (ref >60)
Glucose, Bld: 78 mg/dL (ref 70–99)
Potassium: 3.7 mmol/L (ref 3.5–5.1)
Sodium: 141 mmol/L (ref 135–145)
Total Bilirubin: 0.3 mg/dL (ref 0.3–1.2)
Total Protein: 6.5 g/dL (ref 6.5–8.1)

## 2020-10-06 LAB — FERRITIN: Ferritin: 4 ng/mL — ABNORMAL LOW (ref 11–307)

## 2020-10-06 LAB — IRON AND TIBC
Iron: 27 ug/dL — ABNORMAL LOW (ref 41–142)
Saturation Ratios: 6 % — ABNORMAL LOW (ref 21–57)
TIBC: 473 ug/dL — ABNORMAL HIGH (ref 236–444)
UIBC: 445 ug/dL — ABNORMAL HIGH (ref 120–384)

## 2020-10-06 MED ORDER — SODIUM CHLORIDE 0.9 % IV SOLN
Freq: Once | INTRAVENOUS | Status: AC
  Filled 2020-10-06: qty 250

## 2020-10-06 NOTE — Progress Notes (Signed)
Natalie Hendrix presents today for phlebotomy per MD orders. Phlebotomy procedure started at 1325 and ended at 1337. 525 grams removed via 18 gauge needle to right AC. Patient declined to stay for 30 minutes after procedure stating she had already received fluids and felt fine. Patient tolerated procedure well. IV needle removed intact. Pt discharged in stable condition.

## 2020-10-06 NOTE — Patient Instructions (Signed)
Therapeutic Phlebotomy Therapeutic phlebotomy is the planned removal of blood from a person's body for the purpose of treating a medical condition. The procedure is similar to donating blood. Usually, about a pint (470 mL, or 0.47 L) of blood is removed. The average adult has 9-12 pints (4.3-5.7 L) of blood in the body. Therapeutic phlebotomy may be used to treat the following medical conditions:  Hemochromatosis. This is a condition in which the blood contains too much iron.  Polycythemia vera. This is a condition in which the blood contains too many red blood cells.  Porphyria cutanea tarda. This is a disease in which an important part of hemoglobin is not made properly. It results in the buildup of abnormal amounts of porphyrins in the body.  Sickle cell disease. This is a condition in which the red blood cells form an abnormal crescent shape rather than a round shape. Tell a health care provider about:  Any allergies you have.  All medicines you are taking, including vitamins, herbs, eye drops, creams, and over-the-counter medicines.  Any problems you or family members have had with anesthetic medicines.  Any blood disorders you have.  Any surgeries you have had.  Any medical conditions you have.  Whether you are pregnant or may be pregnant. What are the risks? Generally, this is a safe procedure. However, problems may occur, including:  Nausea or light-headedness.  Low blood pressure (hypotension).  Soreness, bleeding, swelling, or bruising at the needle insertion site.  Infection. What happens before the procedure?  Follow instructions from your health care provider about eating or drinking restrictions.  Ask your health care provider about: ? Changing or stopping your regular medicines. This is especially important if you are taking diabetes medicines or blood thinners (anticoagulants). ? Taking medicines such as aspirin and ibuprofen. These medicines can thin your  blood. Do not take these medicines unless your health care provider tells you to take them. ? Taking over-the-counter medicines, vitamins, herbs, and supplements.  Wear clothing with sleeves that can be raised above the elbow.  Plan to have someone take you home from the hospital or clinic.  You may have a blood sample taken.  Your blood pressure, pulse rate, and breathing rate will be measured. What happens during the procedure?   To lower your risk of infection: ? Your health care team will wash or sanitize their hands. ? Your skin will be cleaned with an antiseptic.  You may be given a medicine to numb the area (local anesthetic).  A tourniquet will be placed on your arm.  A needle will be inserted into one of your veins.  Tubing and a collection bag will be attached to that needle.  Blood will flow through the needle and tubing into the collection bag.  The collection bag will be placed lower than your arm to allow gravity to help the flow of blood into the bag.  You may be asked to open and close your hand slowly and continually during the entire collection.  After the specified amount of blood has been removed from your body, the collection bag and tubing will be clamped.  The needle will be removed from your vein.  Pressure will be held on the site of the needle insertion to stop the bleeding.  A bandage (dressing) will be placed over the needle insertion site. The procedure may vary among health care providers and hospitals. What happens after the procedure?  Your blood pressure, pulse rate, and breathing rate will be   measured after the procedure.  You will be encouraged to drink fluids.  Your recovery will be assessed and monitored.  You can return to your normal activities as told by your health care provider. Summary  Therapeutic phlebotomy is the planned removal of blood from a person's body for the purpose of treating a medical condition.  Therapeutic  phlebotomy may be used to treat hemochromatosis, polycythemia vera, porphyria cutanea tarda, or sickle cell disease.  In the procedure, a needle is inserted and about a pint (470 mL, or 0.47 L) of blood is removed. The average adult has 9-12 pints (4.3-5.7 L) of blood in the body.  This is generally a safe procedure, but it can sometimes cause problems such as nausea, light-headedness, or low blood pressure (hypotension). This information is not intended to replace advice given to you by your health care provider. Make sure you discuss any questions you have with your health care provider. Document Revised: 12/04/2017 Document Reviewed: 12/04/2017 Elsevier Patient Education  2020 [employer]  

## 2020-10-06 NOTE — Progress Notes (Signed)
Hematology and Oncology Follow Up Visit  Natalie Hendrix [id number] August 11, 1951 69 y.o. 10/06/2020   Principle Diagnosis:  Secondary polycythemia vera- JAK2 negative  Current Therapy: Phlebotomy to maintain hematocrit less than 38% Plavix 75 mg by mouth daily   Interim History:  Natalie Hendrix is here today for follow-up and phlebotomy. She had a fall 3 weeks ago and states that she fractured 4 ribs on the right side, has an intercostal tear and irritated cervical spine. She also has a sore left knee and is wearing a brace. She has followed up with her PCP and is just taking it easy.   Her headaches have been a little more persistent lately.  Hct is 39.1%.  No fever, chills, n/v, cough, rash, chest pain, palpitations, abdominal pain or changes in bowel or bladder habits.  Her SOB has been fairly stable despite the sore ribs. She has supplemental O2 which she wears almost 24 hours a day.  She has numbness and tingling off and on in her fingers.  No bleeding. No bruising or petechiae.  She has a good appetite and is staying well hydrated. Her weight is stable at 205 lbs.   ECOG Performance Status: 1 - Symptomatic but completely ambulatory  Medications:  Allergies as of 10/06/2020      Reactions   Epinephrine Palpitations   Raises heart rate   Flonase [fluticasone] Palpitations   Protriptyline Hcl Other (See Comments)   Severe constipation   Tamiflu [oseltamivir Phosphate] Other (See Comments)   "like I was having a stroke"   Tamiflu [oseltamivir] Anaphylaxis   Tizanidine Other (See Comments)   panic   Neurontin [gabapentin] Other (See Comments)   arthralgia   Other Hives, Rash   boiron acteane   Qvar [beclomethasone] Other (See Comments)   Spiriva [tiotropium Bromide Monohydrate] Rash   Tiotropium Rash   Zonegran Other (See Comments)   Mood swings   Advair Diskus [fluticasone-salmeterol] Rash   Baclofen Rash   Chlorzoxazone Rash   Lamotrigine Rash   Levofloxacin Rash     Protriptyline Rash   Ropinirole Rash   Verapamil Rash      Medication List       Accurate as of October 06, 2020 10:33 AM. If you have any questions, ask your nurse or doctor.        albuterol 108 (90 Base) MCG/ACT inhaler Commonly known as: ProAir HFA Inhale 2 puffs into the lungs every 6 (six) hours as needed for wheezing or shortness of breath.   albuterol (2.5 MG/3ML) 0.083% nebulizer solution Commonly known as: PROVENTIL Take 3 mLs (2.5 mg total) by nebulization every 6 (six) hours as needed for wheezing or shortness of breath.   azelastine 0.1 % nasal spray Commonly known as: ASTELIN Place 1 spray into both nostrils 2 (two) times daily.   beclomethasone 42 MCG/SPRAY nasal spray Commonly known as: BECONASE-AQ 2 puffs each nostril once daily   benzonatate 100 MG capsule Commonly known as: TESSALON Take 1 capsule (100 mg total) by mouth every 6 (six) hours as needed for cough.   buPROPion 300 MG 24 hr tablet Commonly known as: WELLBUTRIN XL Take 1 tablet (300 mg total) by mouth daily with breakfast.   CALCIUM 1200 PO Take 1,250 mg by mouth daily.   Centrum Silver tablet Take 1 tablet by mouth daily.   clopidogrel 75 MG tablet Commonly known as: PLAVIX Take 1 tablet (75 mg total) by mouth daily.   cyclobenzaprine 10 MG tablet Commonly known as: FLEXERIL  Take 10 mg by mouth every 8 (eight) hours as needed.   ESTRACE VAGINAL 0.1 MG/GM vaginal cream Generic drug: estradiol Place 1 Applicatorful vaginally daily. Small amount each dose per pt   ezetimibe 10 MG tablet Commonly known as: Zetia TAKE 1 TABLET BY MOUTH DAILY.   fexofenadine 180 MG tablet Commonly known as: Allegra Allergy Take 1 tablet (180 mg total) by mouth daily.   Fish Oil 1200 MG Caps Take 1,200 mg by mouth at bedtime.   hydrOXYzine 10 MG tablet Commonly known as: ATARAX/VISTARIL Take 10 mg by mouth 3 (three) times daily as needed.   hyoscyamine 0.125 MG Tbdp disintergrating  tablet Commonly known as: ANASPAZ Place 1 tablet (0.125 mg total) under the tongue 2 (two) times daily as needed. What changed: reasons to take this   lidocaine 5 % Commonly known as: LIDODERM Place 1 patch onto the skin as needed (pain). Remove & Discard patch within 12 hours or as directed by MD   LORazepam 1 MG tablet Commonly known as: ATIVAN Take 1 tablet (1 mg total) by mouth 2 (two) times daily. May take extra 3rd dose if needed What changed:   when to take this  reasons to take this   magnesium gluconate 500 MG tablet Commonly known as: MAGONATE Take 500 mg by mouth daily as needed (constipation).   meclizine 25 MG tablet Commonly known as: ANTIVERT Take 1 tablet (25 mg total) by mouth 3 (three) times daily as needed for dizziness. 3 month supply What changed: when to take this   metoprolol succinate 50 MG 24 hr tablet Commonly known as: TOPROL-XL Take 1 tablet (50 mg total) by mouth daily with breakfast. What changed: how much to take   montelukast 10 MG tablet Commonly known as: SINGULAIR 1 daily   morphine 15 MG tablet Commonly known as: MSIR Take 15 mg by mouth every 4 (four) hours as needed for severe pain.   morphine 30 MG 12 hr tablet Commonly known as: MS CONTIN Take 1 tablet by mouth every 12 (twelve) hours.   Nebulizer Devi Use as directed   OXYGEN Inhale 2.5 L/hr into the lungs continuous.   pantoprazole 40 MG tablet Commonly known as: PROTONIX TAKE 1 TABLET (40 MG TOTAL) BY MOUTH DAILY.   pravastatin 40 MG tablet Commonly known as: PRAVACHOL Take 1 tablet (40 mg total) by mouth at bedtime.   promethazine 25 MG tablet Commonly known as: PHENERGAN Take 25 mg by mouth every 8 (eight) hours as needed for nausea or vomiting.   Systane 0.4-0.3 % Soln Generic drug: Polyethyl Glycol-Propyl Glycol Apply 1 drop to eye daily as needed (dry eyes).   TART CHERRY ADVANCED PO Take 5 mLs by mouth daily. Juice Concentrate- 1tsp daily   Zovirax 5  % Generic drug: acyclovir ointment Apply 1 application topically as needed (flair).       Allergies:  Allergies  Allergen Reactions  . Epinephrine Palpitations    Raises heart rate   . Flonase [Fluticasone] Palpitations  . Protriptyline Hcl Other (See Comments)    Severe constipation  . Tamiflu [Oseltamivir Phosphate] Other (See Comments)    "like I was having a stroke"  . Tamiflu [Oseltamivir] Anaphylaxis  . Tizanidine Other (See Comments)    panic  . Neurontin [Gabapentin] Other (See Comments)    arthralgia  . Other Hives and Rash    boiron acteane  . Qvar [Beclomethasone] Other (See Comments)  . Spiriva [Tiotropium Bromide Monohydrate] Rash  . Tiotropium Rash  .  Zonegran Other (See Comments)    Mood swings  . Advair Diskus [Fluticasone-Salmeterol] Rash  . Baclofen Rash  . Chlorzoxazone Rash  . Lamotrigine Rash  . Levofloxacin Rash  . Protriptyline Rash  . Ropinirole Rash  . Verapamil Rash    Past Medical History, Surgical history, Social history, and Family History were reviewed and updated.  Review of Systems: All other 10 point review of systems is negative.   Physical Exam:  vitals were not taken for this visit.   Wt Readings from Last 3 Encounters:  09/06/20 200 lb 1.3 oz (90.8 kg)  07/26/20 197 lb 12 oz (89.7 kg)  05/30/20 197 lb (89.4 kg)    Ocular: Sclerae unicteric, pupils equal, round and reactive to light Ear-nose-throat: Oropharynx clear, dentition fair Lymphatic: No cervical or supraclavicular adenopathy Lungs no rales or rhonchi, good excursion bilaterally Heart regular rate and rhythm, no murmur appreciated Abd soft, nontender, positive bowel sounds MSK no focal spinal tenderness, no joint edema Neuro: non-focal, well-oriented, appropriate affect Breasts: Deferred   Lab Results  Component Value Date   WBC 5.1 10/06/2020   HGB 11.6 (L) 10/06/2020   HCT 39.1 10/06/2020   MCV 76.5 (L) 10/06/2020   PLT 265 10/06/2020   Lab Results   Component Value Date   FERRITIN 4 (L) 09/06/2020   IRON 37 (L) 09/06/2020   TIBC 491 (H) 09/06/2020   UIBC 455 (H) 09/06/2020   IRONPCTSAT 7 (L) 09/06/2020   Lab Results  Component Value Date   RETICCTPCT 1.1 04/27/2015   RBC 5.11 10/06/2020   RETICCTABS 55.1 04/27/2015   No results found for: KPAFRELGTCHN, LAMBDASER, KAPLAMBRATIO No results found for: IGGSERUM, IGA, IGMSERUM No results found for: Odetta Pink, SPEI   Chemistry      Component Value Date/Time   NA 139 09/06/2020 1009   NA 147 (H) 10/08/2017 1410   NA 141 02/17/2017 1132   K 4.5 09/06/2020 1009   K 4.6 10/08/2017 1410   K 5.3 (H) 02/17/2017 1132   CL 103 09/06/2020 1009   CL 106 10/08/2017 1410   CO2 32 09/06/2020 1009   CO2 30 10/08/2017 1410   CO2 29 02/17/2017 1132   BUN 16 09/06/2020 1009   BUN 15 10/08/2017 1410   BUN 17.8 02/17/2017 1132   CREATININE 0.83 09/06/2020 1009   CREATININE 1.0 10/08/2017 1410   CREATININE 0.8 02/17/2017 1132      Component Value Date/Time   CALCIUM 9.6 09/06/2020 1009   CALCIUM 9.0 10/08/2017 1410   CALCIUM 9.3 02/17/2017 1132   ALKPHOS 64 09/06/2020 1009   ALKPHOS 59 10/08/2017 1410   ALKPHOS 79 02/17/2017 1132   AST 23 09/06/2020 1009   AST 22 02/17/2017 1132   ALT 16 09/06/2020 1009   ALT 26 10/08/2017 1410   ALT 20 02/17/2017 1132   BILITOT 0.4 09/06/2020 1009   BILITOT 0.39 02/17/2017 1132       Impression and Plan: Natalie Hendrix is a very pleasant 69yo caucasian female with secondary polycythemia (JAK-2 negative) due to COPD. She had a phlebotomy and received replacement fluids today.  Follow-up in 1 month.  She can contact our office with any questions or concerns.   [name], NP 11/5/202110:33 AM

## 2020-10-06 NOTE — Telephone Encounter (Signed)
Kathlee Nations, please advise. Thanks

## 2020-10-09 ENCOUNTER — Telehealth: Payer: Self-pay | Admitting: Family

## 2020-10-09 NOTE — Telephone Encounter (Signed)
Appointments scheduled calendar printed & mailed per 11/5 los 

## 2020-10-10 ENCOUNTER — Telehealth: Payer: Self-pay

## 2020-10-10 NOTE — Telephone Encounter (Signed)
Called [phone] and s/w pt, she is aware of her new r/s from from 11/06/20 to 11/10/20.Marland KitchenMarland KitchenAOM

## 2020-10-17 NOTE — Telephone Encounter (Signed)
This was reviewed and resolved.  That DOS has been paid in full.

## 2020-10-17 NOTE — Telephone Encounter (Signed)
Noted  

## 2020-10-24 DIAGNOSIS — E785 Hyperlipidemia, unspecified: Secondary | ICD-10-CM | POA: Diagnosis not present

## 2020-10-24 DIAGNOSIS — R0902 Hypoxemia: Secondary | ICD-10-CM | POA: Diagnosis not present

## 2020-10-24 DIAGNOSIS — D45 Polycythemia vera: Secondary | ICD-10-CM | POA: Diagnosis not present

## 2020-10-24 DIAGNOSIS — I639 Cerebral infarction, unspecified: Secondary | ICD-10-CM | POA: Diagnosis not present

## 2020-10-30 ENCOUNTER — Other Ambulatory Visit: Payer: Self-pay

## 2020-10-30 ENCOUNTER — Encounter: Payer: Self-pay | Admitting: Internal Medicine

## 2020-10-30 ENCOUNTER — Ambulatory Visit (INDEPENDENT_AMBULATORY_CARE_PROVIDER_SITE_OTHER): Payer: Medicare HMO | Admitting: Internal Medicine

## 2020-10-30 VITALS — BP 120/70 | HR 82 | Temp 97.6°F | Wt 209.2 lb

## 2020-10-30 DIAGNOSIS — M791 Myalgia, unspecified site: Secondary | ICD-10-CM | POA: Diagnosis not present

## 2020-10-30 DIAGNOSIS — S134XXS Sprain of ligaments of cervical spine, sequela: Secondary | ICD-10-CM | POA: Diagnosis not present

## 2020-10-30 DIAGNOSIS — J9611 Chronic respiratory failure with hypoxia: Secondary | ICD-10-CM

## 2020-10-30 DIAGNOSIS — Z23 Encounter for immunization: Secondary | ICD-10-CM | POA: Diagnosis not present

## 2020-10-30 DIAGNOSIS — J4520 Mild intermittent asthma, uncomplicated: Secondary | ICD-10-CM | POA: Diagnosis not present

## 2020-10-30 DIAGNOSIS — M542 Cervicalgia: Secondary | ICD-10-CM | POA: Diagnosis not present

## 2020-10-30 DIAGNOSIS — S335XXS Sprain of ligaments of lumbar spine, sequela: Secondary | ICD-10-CM | POA: Diagnosis not present

## 2020-10-30 DIAGNOSIS — G894 Chronic pain syndrome: Secondary | ICD-10-CM | POA: Diagnosis not present

## 2020-10-30 NOTE — Progress Notes (Signed)
HPI female never smoker followed for asthma, history pneumonia, cough, complicated by past history for thoracic outlet syndrome, chronic hypoxic respiratory failure, complicated by CVA, GERD, allergic rhinitis, polycythemia, depression O2 2 L sleep and prn/Advanced  Nl PFT 2012 except DLCO 62% Allergy profile was NEG, 4.9 total IgE GI/ Dr Paulita Fujita: Ba swallow> stricture and probably mild aspiration Residual right diaphragm elevation after surgery for right thoracic outlet syndrome --------------------------------------------------------------------------------  04/24/20- 69 year old female never smoker followed for asthma, history pneumonia, cough, chronic hypoxic respiratory failure,  past history for thoracic outlet syndrome, chronic elevation right diaphragm,complicated by CVA, GERD, allergic rhinitis, polycythemia, depression O2 2-3 L sleep and prn/Adapt   POC = Inogen. Albuterol hfa, neb albuterol, benzonatate, allegra,  -----cough and SOB decreased since last visit  Little wheeze or cough now. DOE w exertion- uses Proair about 2x/ day. Husband here, helps her around house.  Had 2 Moderna Covax.  10/30/20-  69 year old female never smoker followed for asthma, history pneumonia, cough, chronic hypoxic respiratory failure,  past history for thoracic outlet syndrome, chronic elevation right diaphragm,complicated by CVA, GERD, allergic rhinitis, polycythemia, depression O2 2-3 L sleep and prn/Adapt   POC = Inogen. Albuterol hfa, neb albuterol, benzonatate, allegra,  Covid vax- 3 Moderna Flu vax- today senior [place] 4 months ago> broke R ribs. Xray'ed, slept in recliner. Better now.   Review of Systems- See HPI   + = positive Constitutional:   No-   weight loss, night sweats, fevers, chills, fatigue, lassitude. HEENT:   frequent  headaches,  Some difficulty swallowing,, sore throat,       No-  Sneezing,+ itching, ear ache, nasal congestion, post nasal drip,  CV:  No-   chest pain, orthopnea,  PND, swelling in lower extremities, anasarca, dizziness, palpitations Resp: + shortness of breath with exertion or at rest. productive cough              No-  coughing up of blood.              No-  change in color of mucus.   wheezing.   Skin: No-   rash or lesions. GI:  No-   heartburn, indigestion, abdominal pain, nausea, vomiting, GU:  MS:  No-   joint pain or swelling.   Neuro- + tremor and poor balance Psych:  No- change in mood or affect. No depression or anxiety.  No memory loss.    Objective:   Physical Exam General- Alert, Oriented, Affect+ tearful, Distress- none acute,  Overweight.  + Rolling walker              O2 POC 2L. Skin- rash-none, lesions- none, excoriation- none Lymphadenopathy- none Head- atraumatic            Eyes- Gross vision intact, PERRLA, conjunctivae clear secretions            Ears- Hearing, canals            Nose- Clear, No- Septal dev, mucus, polyps, erosion, perforation             Throat- Mallampati III , mucosa clear , drainage- none, tonsils- atrophic, Neck- flexible , trachea midline, no stridor , thyroid nl, carotid no bruit Chest - symmetrical excursion , unlabored           Heart/CV- RRR , no murmur , no gallop  , no rub, nl s1 s2                           -  JVD- none , edema- none, stasis changes- none, varices- none           Lung- clear to P&A, wheeze-none, cough-none,                                                    dullness-none, rub- none, unlabored           Chest wall- +Vascular surgery scar Right Upper Anterior chest Abd- Br/ Gen/ Rectal- Not done, not indicated Extrem- cyanosis- none, clubbing, none, atrophy- none, strength- . +Rolling walker Neuro- + some word searching

## 2020-10-30 NOTE — Patient Instructions (Signed)
Order- flu vax senior for patient and husband  We can continue current oxygen and meds  Please call if we can help

## 2020-11-06 ENCOUNTER — Ambulatory Visit: Payer: Medicare HMO | Admitting: Family

## 2020-11-06 ENCOUNTER — Other Ambulatory Visit: Payer: Medicare HMO

## 2020-11-06 ENCOUNTER — Ambulatory Visit: Payer: Medicare HMO

## 2020-11-13 ENCOUNTER — Inpatient Hospital Stay: Payer: Medicare HMO

## 2020-11-13 ENCOUNTER — Inpatient Hospital Stay: Payer: Medicare HMO | Attending: Hematology & Oncology

## 2020-11-13 ENCOUNTER — Inpatient Hospital Stay: Payer: Medicare HMO | Admitting: Family

## 2020-11-13 DIAGNOSIS — D751 Secondary polycythemia: Secondary | ICD-10-CM | POA: Insufficient documentation

## 2020-11-13 DIAGNOSIS — D500 Iron deficiency anemia secondary to blood loss (chronic): Secondary | ICD-10-CM | POA: Insufficient documentation

## 2020-11-14 DIAGNOSIS — Z0100 Encounter for examination of eyes and vision without abnormal findings: Secondary | ICD-10-CM | POA: Diagnosis not present

## 2020-11-16 DIAGNOSIS — F39 Unspecified mood [affective] disorder: Secondary | ICD-10-CM | POA: Diagnosis not present

## 2020-11-16 DIAGNOSIS — R69 Illness, unspecified: Secondary | ICD-10-CM | POA: Diagnosis not present

## 2020-11-16 DIAGNOSIS — I10 Essential (primary) hypertension: Secondary | ICD-10-CM | POA: Diagnosis not present

## 2020-11-16 DIAGNOSIS — J449 Chronic obstructive pulmonary disease, unspecified: Secondary | ICD-10-CM | POA: Diagnosis not present

## 2020-11-20 ENCOUNTER — Telehealth: Payer: Self-pay | Admitting: Internal Medicine

## 2020-11-20 NOTE — Telephone Encounter (Signed)
Not sure if current symptoms are from dry indoor heat, leaf-mold allergy or something else. For now, consider trying artificial tears, otc Flonase nasal spray ( 2 sprays each nostril every night)  Agree with using nebulizer and with increasing O2 to 2L if needed. Let us know if she gets worse.

## 2020-11-20 NOTE — Telephone Encounter (Signed)
Complaints of itchy eyes and runny nose which has been draining to her throat. Pt said she took benadryl to see if it would help and then she said she woke up in the middle of the night having complaints of cough with clear phlegm. Pt said symptoms began yesterday 12/20.  Pt is taking allegra in the morning and also staking symbicort at night. Pt said these meds are not currently helping with her symptoms.  Pt states that she has not had to use her nebulizer yet but said that she has used the albuterol inhaler twice to help with her symptoms.  Pt states that she also feels like she may be a little more SOB than usual. Pt denies any complaints of fever as temp today was 97.6. pt is wearing her O2 at 2L and if she does become SOB, she will bump herself up to 3L.  Pt wants to know if there is anything that could be prescribed to help with her symptoms. Dr. Annamaria Boots, please advise.  Allergies  Allergen Reactions  . Epinephrine Palpitations    Raises heart rate   . Flonase [Fluticasone] Palpitations  . Protriptyline Hcl Other (See Comments)    Severe constipation  . Tamiflu [Oseltamivir Phosphate] Other (See Comments)    "like I was having a stroke"  . Tamiflu [Oseltamivir] Anaphylaxis  . Tizanidine Other (See Comments)    panic  . Neurontin [Gabapentin] Other (See Comments)    arthralgia  . Other Hives and Rash    boiron acteane  . Qvar [Beclomethasone] Other (See Comments)  . Spiriva [Tiotropium Bromide Monohydrate] Rash  . Tiotropium Rash  . Zonegran Other (See Comments)    Mood swings  . Advair Diskus [Fluticasone-Salmeterol] Rash  . Baclofen Rash  . Chlorzoxazone Rash  . Lamotrigine Rash  . Levofloxacin Rash  . Protriptyline Rash  . Ropinirole Rash  . Verapamil Rash     Current Outpatient Medications:  .  albuterol (PROAIR HFA) 108 (90 Base) MCG/ACT inhaler, Inhale 2 puffs into the lungs every 6 (six) hours as needed for wheezing or shortness of breath., Disp: 8 g, Rfl: 5 .   albuterol (PROVENTIL) (2.5 MG/3ML) 0.083% nebulizer solution, Take 3 mLs (2.5 mg total) by nebulization every 6 (six) hours as needed for wheezing or shortness of breath., Disp: 1080 mL, Rfl: 2 .  azelastine (ASTELIN) 0.1 % nasal spray, Place 1 spray into both nostrils 2 (two) times daily., Disp: 90 mL, Rfl: 3 .  beclomethasone (BECONASE-AQ) 42 MCG/SPRAY nasal spray, 2 puffs each nostril once daily, Disp: 75 g, Rfl: 3 .  benzonatate (TESSALON) 100 MG capsule, Take 1 capsule (100 mg total) by mouth every 6 (six) hours as needed for cough., Disp: 360 capsule, Rfl: 3 .  buPROPion (WELLBUTRIN XL) 300 MG 24 hr tablet, Take 1 tablet (300 mg total) by mouth daily with breakfast., Disp: 90 tablet, Rfl: 3 .  Calcium Carbonate-Vit D-Min (CALCIUM 1200 PO), Take 1,250 mg by mouth daily. , Disp: , Rfl:  .  clopidogrel (PLAVIX) 75 MG tablet, Take 1 tablet (75 mg total) by mouth daily., Disp: 90 tablet, Rfl: 1 .  cyclobenzaprine (FLEXERIL) 10 MG tablet, Take 10 mg by mouth every 8 (eight) hours as needed., Disp: , Rfl:  .  ESTRACE VAGINAL 0.1 MG/GM vaginal cream, Place 1 Applicatorful vaginally daily. Small amount each dose per pt, Disp: , Rfl:  .  ezetimibe (ZETIA) 10 MG tablet, TAKE 1 TABLET BY MOUTH DAILY., Disp: 90 tablet, Rfl: 3 .  fexofenadine (ALLEGRA ALLERGY) 180 MG tablet, Take 1 tablet (180 mg total) by mouth daily., Disp: 90 tablet, Rfl: 1 .  hydrOXYzine (ATARAX/VISTARIL) 10 MG tablet, Take 10 mg by mouth 3 (three) times daily as needed., Disp: , Rfl:  .  hyoscyamine (ANASPAZ) 0.125 MG TBDP disintergrating tablet, Place 1 tablet (0.125 mg total) under the tongue 2 (two) times daily as needed. (Patient taking differently: Place 0.125 mg under the tongue 2 (two) times daily as needed for bladder spasms or cramping. ), Disp: 180 tablet, Rfl: 0 .  lidocaine (LIDODERM) 5 %, Place 1 patch onto the skin as needed (pain). Remove & Discard patch within 12 hours or as directed by MD, Disp: , Rfl:  .  LORazepam  (ATIVAN) 1 MG tablet, Take 1 tablet (1 mg total) by mouth 2 (two) times daily. May take extra 3rd dose if needed (Patient taking differently: Take 1 mg by mouth daily as needed. May take extra 3rd dose if needed), Disp: 75 tablet, Rfl: 0 .  magnesium gluconate (MAGONATE) 500 MG tablet, Take 500 mg by mouth daily as needed (constipation). , Disp: , Rfl:  .  meclizine (ANTIVERT) 25 MG tablet, Take 1 tablet (25 mg total) by mouth 3 (three) times daily as needed for dizziness. 3 month supply (Patient taking differently: Take 25 mg by mouth 3 (three) times daily. 3 month supply), Disp: 60 tablet, Rfl: 0 .  metoprolol succinate (TOPROL-XL) 50 MG 24 hr tablet, Take 1 tablet (50 mg total) by mouth daily with breakfast. (Patient taking differently: Take 75 mg by mouth daily with breakfast. ), Disp: 90 tablet, Rfl: 1 .  Misc Natural Products (TART CHERRY ADVANCED PO), Take 5 mLs by mouth daily. Juice Concentrate- 1tsp daily, Disp: , Rfl:  .  montelukast (SINGULAIR) 10 MG tablet, 1 daily, Disp: 90 tablet, Rfl: 3 .  morphine (MS CONTIN) 30 MG 12 hr tablet, Take 1 tablet by mouth every 12 (twelve) hours., Disp: , Rfl:  .  morphine (MSIR) 15 MG tablet, Take 15 mg by mouth every 4 (four) hours as needed for severe pain., Disp: , Rfl:  .  Multiple Vitamins-Minerals (CENTRUM SILVER) tablet, Take 1 tablet by mouth daily., Disp: , Rfl:  .  Omega-3 Fatty Acids (FISH OIL) 1200 MG CAPS, Take 1,200 mg by mouth at bedtime. , Disp: , Rfl:  .  OXYGEN, Inhale 2.5 L/hr into the lungs continuous., Disp: , Rfl:  .  pantoprazole (PROTONIX) 40 MG tablet, TAKE 1 TABLET (40 MG TOTAL) BY MOUTH DAILY., Disp: 90 tablet, Rfl: 3 .  Polyethyl Glycol-Propyl Glycol (SYSTANE) 0.4-0.3 % SOLN, Apply 1 drop to eye daily as needed (dry eyes). , Disp: , Rfl:  .  pravastatin (PRAVACHOL) 40 MG tablet, Take 1 tablet (40 mg total) by mouth at bedtime., Disp: 90 tablet, Rfl: 3 .  promethazine (PHENERGAN) 25 MG tablet, Take 25 mg by mouth every 8 (eight)  hours as needed for nausea or vomiting. , Disp: , Rfl: 1 .  Respiratory Therapy Supplies (NEBULIZER) DEVI, Use as directed, Disp: 1 each, Rfl: 0 .  ZOVIRAX 5 %, Apply 1 application topically as needed (flair). , Disp: , Rfl:  No current facility-administered medications for this visit.  Facility-Administered Medications Ordered in Other Visits:  .  0.9 %  sodium chloride infusion, 1,000 mL, Intravenous, Once, [place], Holli Humbles, NP

## 2020-11-20 NOTE — Telephone Encounter (Signed)
Spoke with the pt and notified of response per Dr Annamaria Boots  She verbalized understanding  She will call if not improving

## 2020-11-20 NOTE — Telephone Encounter (Signed)
Tried calling pt, NA and no option to leave msg. Will try calling again.

## 2020-11-22 ENCOUNTER — Telehealth: Payer: Self-pay | Admitting: Family

## 2020-11-22 ENCOUNTER — Inpatient Hospital Stay (HOSPITAL_BASED_OUTPATIENT_CLINIC_OR_DEPARTMENT_OTHER): Payer: Medicare HMO | Admitting: Family

## 2020-11-22 ENCOUNTER — Other Ambulatory Visit: Payer: Self-pay

## 2020-11-22 ENCOUNTER — Inpatient Hospital Stay: Payer: Medicare HMO

## 2020-11-22 ENCOUNTER — Encounter: Payer: Self-pay | Admitting: Family

## 2020-11-22 VITALS — BP 128/58 | HR 75 | Temp 98.7°F | Resp 20 | Ht 63.0 in | Wt 203.4 lb

## 2020-11-22 VITALS — BP 123/57 | HR 71 | Temp 98.5°F | Resp 20

## 2020-11-22 DIAGNOSIS — D751 Secondary polycythemia: Secondary | ICD-10-CM

## 2020-11-22 DIAGNOSIS — D45 Polycythemia vera: Secondary | ICD-10-CM

## 2020-11-22 DIAGNOSIS — D500 Iron deficiency anemia secondary to blood loss (chronic): Secondary | ICD-10-CM | POA: Diagnosis not present

## 2020-11-22 LAB — CBC WITH DIFFERENTIAL (CANCER CENTER ONLY)
Abs Immature Granulocytes: 0.01 10*3/uL (ref 0.00–0.07)
Basophils Absolute: 0.1 10*3/uL (ref 0.0–0.1)
Basophils Relative: 1 %
Eosinophils Absolute: 0.2 10*3/uL (ref 0.0–0.5)
Eosinophils Relative: 4 %
HCT: 39.8 % (ref 36.0–46.0)
Hemoglobin: 11.8 g/dL — ABNORMAL LOW (ref 12.0–15.0)
Immature Granulocytes: 0 %
Lymphocytes Relative: 27 %
Lymphs Abs: 1.5 10*3/uL (ref 0.7–4.0)
MCH: 22.1 pg — ABNORMAL LOW (ref 26.0–34.0)
MCHC: 29.6 g/dL — ABNORMAL LOW (ref 30.0–36.0)
MCV: 74.7 fL — ABNORMAL LOW (ref 80.0–100.0)
Monocytes Absolute: 0.6 10*3/uL (ref 0.1–1.0)
Monocytes Relative: 10 %
Neutro Abs: 3.1 10*3/uL (ref 1.7–7.7)
Neutrophils Relative %: 58 %
Platelet Count: 268 10*3/uL (ref 150–400)
RBC: 5.33 MIL/uL — ABNORMAL HIGH (ref 3.87–5.11)
RDW: 17.1 % — ABNORMAL HIGH (ref 11.5–15.5)
WBC Count: 5.4 10*3/uL (ref 4.0–10.5)
nRBC: 0 % (ref 0.0–0.2)

## 2020-11-22 LAB — CMP (CANCER CENTER ONLY)
ALT: 15 U/L (ref 0–44)
AST: 20 U/L (ref 15–41)
Albumin: 4.2 g/dL (ref 3.5–5.0)
Alkaline Phosphatase: 62 U/L (ref 38–126)
Anion gap: 7 (ref 5–15)
BUN: 17 mg/dL (ref 8–23)
CO2: 30 mmol/L (ref 22–32)
Calcium: 9.8 mg/dL (ref 8.9–10.3)
Chloride: 104 mmol/L (ref 98–111)
Creatinine: 0.88 mg/dL (ref 0.44–1.00)
GFR, Estimated: >60 mL/min (ref >60)
Glucose, Bld: 99 mg/dL (ref 70–99)
Potassium: 4.4 mmol/L (ref 3.5–5.1)
Sodium: 141 mmol/L (ref 135–145)
Total Bilirubin: 0.6 mg/dL (ref 0.3–1.2)
Total Protein: 6.8 g/dL (ref 6.5–8.1)

## 2020-11-22 MED ORDER — SODIUM CHLORIDE 0.9 % IV SOLN
Freq: Once | INTRAVENOUS | Status: AC
  Filled 2020-11-22: qty 250

## 2020-11-22 NOTE — Progress Notes (Signed)
Pt. stated that she has pain in her L forearm a few minutes after started the infusion. IV site was infiltrated so the needle was pulled out and another IV was started on her R arm. At 3:44 checked pt's L arm. No redness, bruising or swelling. Pt. states that the pain completely subsided.

## 2020-11-22 NOTE — Telephone Encounter (Signed)
Appointments scheduled calendar printed per 12/22 los 

## 2020-11-22 NOTE — Progress Notes (Signed)
Hematology and Oncology Follow Up Visit  Natalie Hendrix [id number] Apr 02, 1951 69 y.o. 11/22/2020   Principle Diagnosis:  Secondary polycythemia vera- JAK2 negative  Current Therapy: Phlebotomy to maintain hematocrit less than 38% Plavix 75 mg by mouth daily   Interim History:  Natalie Hendrix is here today for follow-up and phlebotomy. Hct today is 39.8%. She is symptomatic with fatigue and "itching like crazy".  No rash noted. She also has hot flashes and night sweats at times.  She has SOB with any exertion. She is on 2 L [place] supplemental O2 24 hours a day. She states that she will increase up to  2.5-3L with activity if needed.  No fever, chills, n/v, cough, rash, dizziness, chest pain, palpitations, abdominal pain or changes in bowel or bladder habits.  No blood loss noted. No abnormal bruising, no petechiae.  No swelling or tenderness in her extremities. She has occasional numbness and tingling in her fingers. This comes and goes.  No falls or syncope. She ambulates with her Rolator for added support.  She has maintained a good appetite and is staying well hydrated. Her weight is stable.   ECOG Performance Status: 1 - Symptomatic but completely ambulatory  Medications:  Allergies as of 11/22/2020      Reactions   Epinephrine Palpitations   Raises heart rate   Flonase [fluticasone] Palpitations   Protriptyline Hcl Other (See Comments)   Severe constipation   Tamiflu [oseltamivir Phosphate] Other (See Comments)   "like I was having a stroke"   Tamiflu [oseltamivir] Anaphylaxis   Tizanidine Other (See Comments)   panic   Neurontin [gabapentin] Other (See Comments)   arthralgia   Other Hives, Rash   boiron acteane   Qvar [beclomethasone] Other (See Comments)   Spiriva [tiotropium Bromide Monohydrate] Rash   Tiotropium Rash   Zonegran Other (See Comments)   Mood swings   Advair Diskus [fluticasone-salmeterol] Rash   Baclofen Rash   Chlorzoxazone Rash   Lamotrigine  Rash   Levofloxacin Rash   Protriptyline Rash   Ropinirole Rash   Verapamil Rash      Medication List       Accurate as of November 22, 2020  1:38 PM. If you have any questions, ask your nurse or doctor.        albuterol 108 (90 Base) MCG/ACT inhaler Commonly known as: ProAir HFA Inhale 2 puffs into the lungs every 6 (six) hours as needed for wheezing or shortness of breath.   albuterol (2.5 MG/3ML) 0.083% nebulizer solution Commonly known as: PROVENTIL Take 3 mLs (2.5 mg total) by nebulization every 6 (six) hours as needed for wheezing or shortness of breath.   azelastine 0.1 % nasal spray Commonly known as: ASTELIN Place 1 spray into both nostrils 2 (two) times daily.   beclomethasone 42 MCG/SPRAY nasal spray Commonly known as: BECONASE-AQ 2 puffs each nostril once daily   benzonatate 100 MG capsule Commonly known as: TESSALON Take 1 capsule (100 mg total) by mouth every 6 (six) hours as needed for cough.   buPROPion 300 MG 24 hr tablet Commonly known as: WELLBUTRIN XL Take 1 tablet (300 mg total) by mouth daily with breakfast.   CALCIUM 1200 PO Take 1,250 mg by mouth daily.   Centrum Silver tablet Take 1 tablet by mouth daily.   clopidogrel 75 MG tablet Commonly known as: PLAVIX Take 1 tablet (75 mg total) by mouth daily.   cyclobenzaprine 10 MG tablet Commonly known as: FLEXERIL Take 10 mg by mouth  every 8 (eight) hours as needed.   ESTRACE VAGINAL 0.1 MG/GM vaginal cream Generic drug: estradiol Place 1 Applicatorful vaginally daily. Small amount each dose per pt   ezetimibe 10 MG tablet Commonly known as: Zetia TAKE 1 TABLET BY MOUTH DAILY.   fexofenadine 180 MG tablet Commonly known as: Allegra Allergy Take 1 tablet (180 mg total) by mouth daily.   Fish Oil 1200 MG Caps Take 1,200 mg by mouth at bedtime.   hydrOXYzine 10 MG tablet Commonly known as: ATARAX/VISTARIL Take 10 mg by mouth 3 (three) times daily as needed.   hyoscyamine 0.125 MG  Tbdp disintergrating tablet Commonly known as: ANASPAZ Place 1 tablet (0.125 mg total) under the tongue 2 (two) times daily as needed. What changed: reasons to take this   lidocaine 5 % Commonly known as: LIDODERM Place 1 patch onto the skin as needed (pain). Remove & Discard patch within 12 hours or as directed by MD   LORazepam 1 MG tablet Commonly known as: ATIVAN Take 1 tablet (1 mg total) by mouth 2 (two) times daily. May take extra 3rd dose if needed What changed:   when to take this  reasons to take this   magnesium gluconate 500 MG tablet Commonly known as: MAGONATE Take 500 mg by mouth daily as needed (constipation).   meclizine 25 MG tablet Commonly known as: ANTIVERT Take 1 tablet (25 mg total) by mouth 3 (three) times daily as needed for dizziness. 3 month supply What changed: when to take this   metoprolol succinate 50 MG 24 hr tablet Commonly known as: TOPROL-XL Take 1 tablet (50 mg total) by mouth daily with breakfast. What changed: how much to take   montelukast 10 MG tablet Commonly known as: SINGULAIR 1 daily   morphine 15 MG tablet Commonly known as: MSIR Take 15 mg by mouth every 4 (four) hours as needed for severe pain.   morphine 30 MG 12 hr tablet Commonly known as: MS CONTIN Take 1 tablet by mouth every 12 (twelve) hours.   Nebulizer Devi Use as directed   OXYGEN Inhale 2.5 L/hr into the lungs continuous.   pantoprazole 40 MG tablet Commonly known as: PROTONIX TAKE 1 TABLET (40 MG TOTAL) BY MOUTH DAILY.   pravastatin 40 MG tablet Commonly known as: PRAVACHOL Take 1 tablet (40 mg total) by mouth at bedtime.   promethazine 25 MG tablet Commonly known as: PHENERGAN Take 25 mg by mouth every 8 (eight) hours as needed for nausea or vomiting.   Systane 0.4-0.3 % Soln Generic drug: Polyethyl Glycol-Propyl Glycol Apply 1 drop to eye daily as needed (dry eyes).   TART CHERRY ADVANCED PO Take 5 mLs by mouth daily. Juice Concentrate-  1tsp daily   Zovirax 5 % Generic drug: acyclovir ointment Apply 1 application topically as needed (flair).       Allergies:  Allergies  Allergen Reactions  . Epinephrine Palpitations    Raises heart rate   . Flonase [Fluticasone] Palpitations  . Protriptyline Hcl Other (See Comments)    Severe constipation  . Tamiflu [Oseltamivir Phosphate] Other (See Comments)    "like I was having a stroke"  . Tamiflu [Oseltamivir] Anaphylaxis  . Tizanidine Other (See Comments)    panic  . Neurontin [Gabapentin] Other (See Comments)    arthralgia  . Other Hives and Rash    boiron acteane  . Qvar [Beclomethasone] Other (See Comments)  . Spiriva [Tiotropium Bromide Monohydrate] Rash  . Tiotropium Rash  . Zonegran Other (See  Comments)    Mood swings  . Advair Diskus [Fluticasone-Salmeterol] Rash  . Baclofen Rash  . Chlorzoxazone Rash  . Lamotrigine Rash  . Levofloxacin Rash  . Protriptyline Rash  . Ropinirole Rash  . Verapamil Rash    Past Medical History, Surgical history, Social history, and Family History were reviewed and updated.  Review of Systems: All other 10 point review of systems is negative.   Physical Exam:  vitals were not taken for this visit.   Wt Readings from Last 3 Encounters:  10/30/20 209 lb 4 oz (94.9 kg)  10/06/20 205 lb (93 kg)  09/06/20 200 lb 1.3 oz (90.8 kg)    Ocular: Sclerae unicteric, pupils equal, round and reactive to light Ear-nose-throat: Oropharynx clear, dentition fair Lymphatic: No cervical or supraclavicular adenopathy Lungs no rales or rhonchi, good excursion bilaterally Heart regular rate and rhythm, no murmur appreciated Abd soft, nontender, positive bowel sounds MSK no focal spinal tenderness, no joint edema Neuro: non-focal, well-oriented, appropriate affect Breasts: Deferred   Lab Results  Component Value Date   WBC 5.4 11/22/2020   HGB 11.8 (L) 11/22/2020   HCT 39.8 11/22/2020   MCV 74.7 (L) 11/22/2020   PLT 268  11/22/2020   Lab Results  Component Value Date   FERRITIN 4 (L) 10/06/2020   IRON 27 (L) 10/06/2020   TIBC 473 (H) 10/06/2020   UIBC 445 (H) 10/06/2020   IRONPCTSAT 6 (L) 10/06/2020   Lab Results  Component Value Date   RETICCTPCT 1.1 04/27/2015   RBC 5.33 (H) 11/22/2020   RETICCTABS 55.1 04/27/2015   No results found for: KPAFRELGTCHN, LAMBDASER, KAPLAMBRATIO No results found for: IGGSERUM, IGA, IGMSERUM No results found for: Odetta Pink, SPEI   Chemistry      Component Value Date/Time   NA 141 10/06/2020 1006   NA 147 (H) 10/08/2017 1410   NA 141 02/17/2017 1132   K 3.7 10/06/2020 1006   K 4.6 10/08/2017 1410   K 5.3 (H) 02/17/2017 1132   CL 106 10/06/2020 1006   CL 106 10/08/2017 1410   CO2 29 10/06/2020 1006   CO2 30 10/08/2017 1410   CO2 29 02/17/2017 1132   BUN 12 10/06/2020 1006   BUN 15 10/08/2017 1410   BUN 17.8 02/17/2017 1132   CREATININE 0.84 10/06/2020 1006   CREATININE 1.0 10/08/2017 1410   CREATININE 0.8 02/17/2017 1132      Component Value Date/Time   CALCIUM 9.4 10/06/2020 1006   CALCIUM 9.0 10/08/2017 1410   CALCIUM 9.3 02/17/2017 1132   ALKPHOS 80 10/06/2020 1006   ALKPHOS 59 10/08/2017 1410   ALKPHOS 79 02/17/2017 1132   AST 19 10/06/2020 1006   AST 22 02/17/2017 1132   ALT 14 10/06/2020 1006   ALT 26 10/08/2017 1410   ALT 20 02/17/2017 1132   BILITOT 0.3 10/06/2020 1006   BILITOT 0.39 02/17/2017 1132       Impression and Plan: Ms. Bernhart is a very pleasant 69yo caucasian female with secondary polycythemia (JAK-2 negative) due to COPD. We will phlebotomize her today for Hct 39.8% and give replacement fluids.  Follow-up in 1 months.  She will contact our office with any questions or concerns.   [name], NP 12/22/20211:38 PM

## 2020-11-22 NOTE — Progress Notes (Signed)
Natalie Hendrix presents today for phlebotomy per MD orders. Phlebotomy procedure started at 1617  and ended at 1642. 516 grams removed. Patient refused to be observed for 30 minutes after procedure. VSS Patient tolerated procedure well. IV needle removed intact.  Pt discharged in no apparent distress. Pt left ambulatory with/ assistance. Pt aware of discharge instructions and verbalized understanding and had no further questions.

## 2020-11-23 DIAGNOSIS — I639 Cerebral infarction, unspecified: Secondary | ICD-10-CM | POA: Diagnosis not present

## 2020-11-23 DIAGNOSIS — D45 Polycythemia vera: Secondary | ICD-10-CM | POA: Diagnosis not present

## 2020-11-23 DIAGNOSIS — R0902 Hypoxemia: Secondary | ICD-10-CM | POA: Diagnosis not present

## 2020-11-23 DIAGNOSIS — E785 Hyperlipidemia, unspecified: Secondary | ICD-10-CM | POA: Diagnosis not present

## 2020-11-23 LAB — FERRITIN: Ferritin: 4 ng/mL — ABNORMAL LOW (ref 11–307)

## 2020-11-23 LAB — IRON AND TIBC
Iron: 55 ug/dL (ref 41–142)
Saturation Ratios: 11 % — ABNORMAL LOW (ref 21–57)
TIBC: 504 ug/dL — ABNORMAL HIGH (ref 236–444)
UIBC: 449 ug/dL — ABNORMAL HIGH (ref 120–384)

## 2020-11-29 DIAGNOSIS — S335XXS Sprain of ligaments of lumbar spine, sequela: Secondary | ICD-10-CM | POA: Diagnosis not present

## 2020-11-29 DIAGNOSIS — M791 Myalgia, unspecified site: Secondary | ICD-10-CM | POA: Diagnosis not present

## 2020-11-29 DIAGNOSIS — S134XXS Sprain of ligaments of cervical spine, sequela: Secondary | ICD-10-CM | POA: Diagnosis not present

## 2020-11-29 DIAGNOSIS — G894 Chronic pain syndrome: Secondary | ICD-10-CM | POA: Diagnosis not present

## 2020-12-05 ENCOUNTER — Telehealth: Payer: Self-pay

## 2020-12-05 NOTE — Assessment & Plan Note (Signed)
Remains dependent on O2 °Plan- continue O2 2-3L °

## 2020-12-05 NOTE — Assessment & Plan Note (Signed)
Notr much wheeze. Uses neb and rescue inhaler appropriately when needed. Husband has put air filters in home.

## 2020-12-05 NOTE — Telephone Encounter (Signed)
Returned pts call to verify 12/25/20 appts    aom

## 2020-12-13 ENCOUNTER — Other Ambulatory Visit: Payer: Self-pay | Admitting: Internal Medicine

## 2020-12-25 ENCOUNTER — Inpatient Hospital Stay: Payer: Medicare HMO

## 2020-12-25 ENCOUNTER — Inpatient Hospital Stay: Payer: Medicare HMO | Attending: Hematology & Oncology

## 2020-12-25 ENCOUNTER — Inpatient Hospital Stay: Payer: Medicare HMO | Admitting: Family

## 2020-12-26 ENCOUNTER — Telehealth: Payer: Self-pay

## 2020-12-26 NOTE — Telephone Encounter (Signed)
Pt calle din to r/s her appts as they still have lots of snow in driveway    Natalie Hendrix

## 2021-01-03 ENCOUNTER — Inpatient Hospital Stay: Payer: Medicare HMO

## 2021-01-03 ENCOUNTER — Inpatient Hospital Stay: Payer: Medicare HMO | Admitting: Family

## 2021-01-04 ENCOUNTER — Other Ambulatory Visit: Payer: Self-pay

## 2021-01-04 ENCOUNTER — Inpatient Hospital Stay: Payer: Medicare HMO

## 2021-01-04 ENCOUNTER — Inpatient Hospital Stay (HOSPITAL_BASED_OUTPATIENT_CLINIC_OR_DEPARTMENT_OTHER): Payer: Medicare HMO | Admitting: Hematology & Oncology

## 2021-01-04 ENCOUNTER — Inpatient Hospital Stay: Payer: Medicare HMO | Attending: Hematology & Oncology

## 2021-01-04 ENCOUNTER — Encounter: Payer: Self-pay | Admitting: Hematology & Oncology

## 2021-01-04 VITALS — BP 134/52 | HR 89 | Temp 98.6°F | Resp 20 | Wt 208.0 lb

## 2021-01-04 VITALS — BP 119/58 | HR 73 | Resp 18

## 2021-01-04 DIAGNOSIS — D45 Polycythemia vera: Secondary | ICD-10-CM

## 2021-01-04 DIAGNOSIS — Z7902 Long term (current) use of antithrombotics/antiplatelets: Secondary | ICD-10-CM | POA: Diagnosis not present

## 2021-01-04 DIAGNOSIS — E611 Iron deficiency: Secondary | ICD-10-CM | POA: Diagnosis not present

## 2021-01-04 DIAGNOSIS — D500 Iron deficiency anemia secondary to blood loss (chronic): Secondary | ICD-10-CM

## 2021-01-04 DIAGNOSIS — D751 Secondary polycythemia: Secondary | ICD-10-CM | POA: Insufficient documentation

## 2021-01-04 LAB — CBC WITH DIFFERENTIAL (CANCER CENTER ONLY)
Abs Immature Granulocytes: 0.02 10*3/uL (ref 0.00–0.07)
Basophils Absolute: 0.1 10*3/uL (ref 0.0–0.1)
Basophils Relative: 1 %
Eosinophils Absolute: 0.1 10*3/uL (ref 0.0–0.5)
Eosinophils Relative: 2 %
HCT: 38.5 % (ref 36.0–46.0)
Hemoglobin: 11.4 g/dL — ABNORMAL LOW (ref 12.0–15.0)
Immature Granulocytes: 0 %
Lymphocytes Relative: 21 %
Lymphs Abs: 1.2 10*3/uL (ref 0.7–4.0)
MCH: 22.3 pg — ABNORMAL LOW (ref 26.0–34.0)
MCHC: 29.6 g/dL — ABNORMAL LOW (ref 30.0–36.0)
MCV: 75.3 fL — ABNORMAL LOW (ref 80.0–100.0)
Monocytes Absolute: 0.6 10*3/uL (ref 0.1–1.0)
Monocytes Relative: 11 %
Neutro Abs: 3.8 10*3/uL (ref 1.7–7.7)
Neutrophils Relative %: 65 %
Platelet Count: 243 10*3/uL (ref 150–400)
RBC: 5.11 MIL/uL (ref 3.87–5.11)
RDW: 17.5 % — ABNORMAL HIGH (ref 11.5–15.5)
WBC Count: 5.9 10*3/uL (ref 4.0–10.5)
nRBC: 0 % (ref 0.0–0.2)

## 2021-01-04 LAB — CMP (CANCER CENTER ONLY)
ALT: 16 U/L (ref 0–44)
AST: 21 U/L (ref 15–41)
Albumin: 4.1 g/dL (ref 3.5–5.0)
Alkaline Phosphatase: 64 U/L (ref 38–126)
Anion gap: 5 (ref 5–15)
BUN: 17 mg/dL (ref 8–23)
CO2: 30 mmol/L (ref 22–32)
Calcium: 9.1 mg/dL (ref 8.9–10.3)
Chloride: 105 mmol/L (ref 98–111)
Creatinine: 0.82 mg/dL (ref 0.44–1.00)
GFR, Estimated: >60 mL/min (ref >60)
Glucose, Bld: 92 mg/dL (ref 70–99)
Potassium: 4.2 mmol/L (ref 3.5–5.1)
Sodium: 140 mmol/L (ref 135–145)
Total Bilirubin: 0.4 mg/dL (ref 0.3–1.2)
Total Protein: 6.4 g/dL — ABNORMAL LOW (ref 6.5–8.1)

## 2021-01-04 MED ORDER — SODIUM CHLORIDE 0.9 % IV SOLN
Freq: Once | INTRAVENOUS | Status: AC
  Filled 2021-01-04: qty 250

## 2021-01-04 NOTE — Patient Instructions (Signed)

## 2021-01-04 NOTE — Progress Notes (Signed)
Natalie Hendrix presents today for phlebotomy per MD orders. Phlebotomy procedure started at 1335 and ended at 1355. 500 cc removed via 20 G needle at [facility] site. Patient tolerated procedure well. Fluids replacement given through the same IV. IV needle removed intact. Pt discharged in no apparent distress. Pt left ambulatory with assistance (cane) Pt aware of discharge instructions and verbalized understanding and had no further questions. Stable and ASX upon discharge.

## 2021-01-04 NOTE — Progress Notes (Signed)
Hematology and Oncology Follow Up Visit  Natalie Hendrix [id number] 1951-02-25 70 y.o. 01/04/2021   Principle Diagnosis:  Secondary polycythemia vera- JAK2 negative  Current Therapy: Phlebotomy to maintain hematocrit less than 38% Plavix 75 mg by mouth daily   Interim History:  Natalie Hendrix is here today for follow-up and phlebotomy.  Overall, Natalie Hendrix seems to be holding her own.  Natalie Hendrix comes in with her husband.  He has his own issues.  He fell in the bathtub and lacerated the back of his skull.  I think this was back in December.  Natalie Hendrix seems to be managing her own.  Natalie Hendrix is on oxygen.  Natalie Hendrix is doing fairly well on the oxygen.  Natalie Hendrix has had no seizure activity.  Natalie Hendrix has had no cerebrovascular accidents.  Natalie Hendrix is on Plavix.  It sounds like Natalie Hendrix is going need to have surgery for her left thumb.  Natalie Hendrix is in need to have thumb fusion.  Natalie Hendrix probably need to be off Plavix for about 5 days before this surgery.  Natalie Hendrix is iron deficient.  Back in December, her ferritin was 4 with an iron saturation of 11%.  Overall, Natalie Hendrix is eating pretty well.  Natalie Hendrix is trying to watch what Natalie Hendrix eats.  Natalie Hendrix is trying to lose a little bit of weight.  Overall, her performance status is ECOG 1.      Allergies as of 01/04/2021      Reactions   Epinephrine Palpitations   Raises heart rate   Flonase [fluticasone] Palpitations   Protriptyline Hcl Other (See Comments)   Severe constipation   Tamiflu [oseltamivir Phosphate] Other (See Comments)   "like I was having a stroke"   Tamiflu [oseltamivir] Anaphylaxis   Tizanidine Other (See Comments)   panic   Neurontin [gabapentin] Other (See Comments)   arthralgia   Other Hives, Rash   boiron acteane   Qvar [beclomethasone] Other (See Comments)   Spiriva [tiotropium Bromide Monohydrate] Rash   Tiotropium Rash   Zonegran Other (See Comments)   Mood swings   Advair Diskus [fluticasone-salmeterol] Rash   Baclofen Rash   Chlorzoxazone Rash   Lamotrigine Rash   Levofloxacin  Rash   Protriptyline Rash   Ropinirole Rash   Verapamil Rash      Medication List       Accurate as of January 04, 2021 12:23 PM. If you have any questions, ask your nurse or doctor.        albuterol 108 (90 Base) MCG/ACT inhaler Commonly known as: ProAir HFA Inhale 2 puffs into the lungs every 6 (six) hours as needed for wheezing or shortness of breath.   albuterol (2.5 MG/3ML) 0.083% nebulizer solution Commonly known as: PROVENTIL Take 3 mLs (2.5 mg total) by nebulization every 6 (six) hours as needed for wheezing or shortness of breath.   azelastine 0.1 % nasal spray Commonly known as: ASTELIN Place 1 spray into both nostrils 2 (two) times daily.   beclomethasone 42 MCG/SPRAY nasal spray Commonly known as: BECONASE-AQ 2 puffs each nostril once daily   benzonatate 100 MG capsule Commonly known as: TESSALON Take 1 capsule (100 mg total) by mouth every 6 (six) hours as needed for cough.   buPROPion 300 MG 24 hr tablet Commonly known as: WELLBUTRIN XL Take 1 tablet (300 mg total) by mouth daily with breakfast.   CALCIUM 1200 PO Take 1,250 mg by mouth daily.   Centrum Silver tablet Take 1 tablet by mouth daily.   clopidogrel 75 MG tablet Commonly  known as: PLAVIX Take 1 tablet (75 mg total) by mouth daily.   cyclobenzaprine 10 MG tablet Commonly known as: FLEXERIL Take 10 mg by mouth every 8 (eight) hours as needed.   ESTRACE VAGINAL 0.1 MG/GM vaginal cream Generic drug: estradiol Place 1 Applicatorful vaginally daily. Small amount each dose per pt   ezetimibe 10 MG tablet Commonly known as: Zetia TAKE 1 TABLET BY MOUTH DAILY.   fexofenadine 180 MG tablet Commonly known as: Allegra Allergy Take 1 tablet (180 mg total) by mouth daily.   Fish Oil 1200 MG Caps Take 1,200 mg by mouth at bedtime.   hydrOXYzine 10 MG tablet Commonly known as: ATARAX/VISTARIL Take 10 mg by mouth 3 (three) times daily as needed.   hyoscyamine 0.125 MG Tbdp disintergrating  tablet Commonly known as: ANASPAZ Place 1 tablet (0.125 mg total) under the tongue 2 (two) times daily as needed. What changed: reasons to take this   lidocaine 5 % Commonly known as: LIDODERM Place 1 patch onto the skin as needed (pain). Remove & Discard patch within 12 hours or as directed by MD   LORazepam 1 MG tablet Commonly known as: ATIVAN Take 1 tablet (1 mg total) by mouth 2 (two) times daily. May take extra 3rd dose if needed What changed:   when to take this  reasons to take this   magnesium gluconate 500 MG tablet Commonly known as: MAGONATE Take 500 mg by mouth daily as needed (constipation).   meclizine 25 MG tablet Commonly known as: ANTIVERT Take 1 tablet (25 mg total) by mouth 3 (three) times daily as needed for dizziness. 3 month supply What changed: when to take this   metoprolol succinate 50 MG 24 hr tablet Commonly known as: TOPROL-XL Take 1 tablet (50 mg total) by mouth daily with breakfast. What changed: how much to take   montelukast 10 MG tablet Commonly known as: SINGULAIR TAKE ONE TABLET BY MOUTH EVERY DAY   morphine 15 MG tablet Commonly known as: MSIR Take 15 mg by mouth every 4 (four) hours as needed for severe pain.   morphine 30 MG 12 hr tablet Commonly known as: MS CONTIN Take 1 tablet by mouth every 12 (twelve) hours.   Nebulizer Devi Use as directed   OXYGEN Inhale 2.5 L/hr into the lungs continuous.   pantoprazole 40 MG tablet Commonly known as: PROTONIX TAKE 1 TABLET (40 MG TOTAL) BY MOUTH DAILY.   Polyethyl Glycol-Propyl Glycol 0.4-0.3 % Soln Apply 1 drop to eye daily as needed (dry eyes).   pravastatin 40 MG tablet Commonly known as: PRAVACHOL Take 1 tablet (40 mg total) by mouth at bedtime.   promethazine 25 MG tablet Commonly known as: PHENERGAN Take 25 mg by mouth every 8 (eight) hours as needed for nausea or vomiting.   TART CHERRY ADVANCED PO Take 5 mLs by mouth daily. Juice Concentrate- 1tsp daily    Zovirax 5 % Generic drug: acyclovir ointment Apply 1 application topically as needed (flair).       Allergies:  Allergies  Allergen Reactions  . Epinephrine Palpitations    Raises heart rate   . Flonase [Fluticasone] Palpitations  . Protriptyline Hcl Other (See Comments)    Severe constipation  . Tamiflu [Oseltamivir Phosphate] Other (See Comments)    "like I was having a stroke"  . Tamiflu [Oseltamivir] Anaphylaxis  . Tizanidine Other (See Comments)    panic  . Neurontin [Gabapentin] Other (See Comments)    arthralgia  . Other Hives and  Rash    boiron acteane  . Qvar [Beclomethasone] Other (See Comments)  . Spiriva [Tiotropium Bromide Monohydrate] Rash  . Tiotropium Rash  . Zonegran Other (See Comments)    Mood swings  . Advair Diskus [Fluticasone-Salmeterol] Rash  . Baclofen Rash  . Chlorzoxazone Rash  . Lamotrigine Rash  . Levofloxacin Rash  . Protriptyline Rash  . Ropinirole Rash  . Verapamil Rash    Past Medical History, Surgical history, Social history, and Family History were reviewed and updated.  Review of Systems: All other 10 point review of systems is negative.   Physical Exam:  weight is 208 lb (94.3 kg). Her oral temperature is 98.6 F (37 C). Her blood pressure is 134/52 (abnormal) and her pulse is 89. Her respiration is 20 and oxygen saturation is 100%.   Wt Readings from Last 3 Encounters:  01/04/21 208 lb (94.3 kg)  11/22/20 203 lb 6.4 oz (92.3 kg)  10/30/20 209 lb 4 oz (94.9 kg)    Ocular: Sclerae unicteric, pupils equal, round and reactive to light Ear-nose-throat: Oropharynx clear, dentition fair Lymphatic: No cervical or supraclavicular adenopathy Lungs no rales or rhonchi, good excursion bilaterally Heart regular rate and rhythm, no murmur appreciated Abd soft, nontender, positive bowel sounds MSK no focal spinal tenderness, no joint edema Neuro: non-focal, well-oriented, appropriate affect Breasts: Deferred   Lab Results   Component Value Date   WBC 5.9 01/04/2021   HGB 11.4 (L) 01/04/2021   HCT 38.5 01/04/2021   MCV 75.3 (L) 01/04/2021   PLT 243 01/04/2021   Lab Results  Component Value Date   FERRITIN 4 (L) 11/22/2020   IRON 55 11/22/2020   TIBC 504 (H) 11/22/2020   UIBC 449 (H) 11/22/2020   IRONPCTSAT 11 (L) 11/22/2020   Lab Results  Component Value Date   RETICCTPCT 1.1 04/27/2015   RBC 5.11 01/04/2021   RETICCTABS 55.1 04/27/2015   No results found for: KPAFRELGTCHN, LAMBDASER, KAPLAMBRATIO No results found for: IGGSERUM, IGA, IGMSERUM No results found for: Odetta Pink, SPEI   Chemistry      Component Value Date/Time   NA 140 01/04/2021 1103   NA 147 (H) 10/08/2017 1410   NA 141 02/17/2017 1132   K 4.2 01/04/2021 1103   K 4.6 10/08/2017 1410   K 5.3 (H) 02/17/2017 1132   CL 105 01/04/2021 1103   CL 106 10/08/2017 1410   CO2 30 01/04/2021 1103   CO2 30 10/08/2017 1410   CO2 29 02/17/2017 1132   BUN 17 01/04/2021 1103   BUN 15 10/08/2017 1410   BUN 17.8 02/17/2017 1132   CREATININE 0.82 01/04/2021 1103   CREATININE 1.0 10/08/2017 1410   CREATININE 0.8 02/17/2017 1132      Component Value Date/Time   CALCIUM 9.1 01/04/2021 1103   CALCIUM 9.0 10/08/2017 1410   CALCIUM 9.3 02/17/2017 1132   ALKPHOS 64 01/04/2021 1103   ALKPHOS 59 10/08/2017 1410   ALKPHOS 79 02/17/2017 1132   AST 21 01/04/2021 1103   AST 22 02/17/2017 1132   ALT 16 01/04/2021 1103   ALT 26 10/08/2017 1410   ALT 20 02/17/2017 1132   BILITOT 0.4 01/04/2021 1103   BILITOT 0.39 02/17/2017 1132     Impression and Plan: Natalie Hendrix is a very pleasant 70yo caucasian female with secondary polycythemia (JAK-2 negative) due to COPD. We will phlebotomize her today and give replacement fluids.   As always, it is nice talking with Natalie Hendrix and her  husband.  There was very interesting.  One never knows what exactly goes on and there life.  Natalie Hendrix does get surgery.  I am  sure the orthopedist will be in contact with this.   [name], MD 2/3/202212:23 PM

## 2021-01-05 LAB — IRON AND TIBC
Iron: 61 ug/dL (ref 41–142)
Saturation Ratios: 13 % — ABNORMAL LOW (ref 21–57)
TIBC: 485 ug/dL — ABNORMAL HIGH (ref 236–444)
UIBC: 424 ug/dL — ABNORMAL HIGH (ref 120–384)

## 2021-01-05 LAB — FERRITIN: Ferritin: 4 ng/mL — ABNORMAL LOW (ref 11–307)

## 2021-01-15 ENCOUNTER — Telehealth: Payer: Self-pay | Admitting: Internal Medicine

## 2021-01-15 NOTE — Telephone Encounter (Signed)
Spoke with patient's husband. He verbalized understanding.   Nothing further needed at time of call.

## 2021-01-15 NOTE — Telephone Encounter (Signed)
Ok to use the Inogen POC and see how she does with it.

## 2021-01-15 NOTE — Telephone Encounter (Signed)
Called and spoke with pt and husband who stated that they were told by [employer] that pt is going to now have to drive to [facility] to get O2 tanks filled and also to get new regulator for tanks.   Husband stated that the regulator valve on the small tanks broke about a week and a half ago and he stated that he did call [employer] about this. He stated they finally were able to speak to rep from [employer] today and they were told that policy per [employer] is that they would not send a tech out to their place in regards to that and that pt could mail it to [employer].  He then said that pt would have to drive to [facility] [employer] office to get this taken care of quickly and also to get tanks filled.  Due to pt having to go to [facility] to get tanks filled, pt and husband want to know if [facility] would be okay with pt using the Inogen instead of using the tanks as they do not plan on going to [facility] to get the tanks filled. Pt's husband stated that pt has already been using the Inogen at least for about the last month now.  Dr. Annamaria Boots, please advise.

## 2021-02-08 ENCOUNTER — Other Ambulatory Visit: Payer: Self-pay | Admitting: Obstetrics and Gynecology

## 2021-02-08 DIAGNOSIS — Z1231 Encounter for screening mammogram for malignant neoplasm of breast: Secondary | ICD-10-CM

## 2021-02-09 ENCOUNTER — Other Ambulatory Visit: Payer: Self-pay

## 2021-02-09 ENCOUNTER — Ambulatory Visit
Admission: RE | Admit: 2021-02-09 | Discharge: 2021-02-09 | Disposition: A | Discharge status: Home / Self Care | Source: Ambulatory Visit | Attending: Obstetrics and Gynecology | Admitting: Obstetrics and Gynecology

## 2021-02-09 DIAGNOSIS — Z1231 Encounter for screening mammogram for malignant neoplasm of breast: Secondary | ICD-10-CM

## 2021-02-15 ENCOUNTER — Encounter: Payer: Self-pay | Admitting: Family

## 2021-02-15 ENCOUNTER — Inpatient Hospital Stay: Payer: Medicare HMO

## 2021-02-15 ENCOUNTER — Other Ambulatory Visit: Payer: Self-pay

## 2021-02-15 ENCOUNTER — Inpatient Hospital Stay (HOSPITAL_BASED_OUTPATIENT_CLINIC_OR_DEPARTMENT_OTHER): Payer: Medicare HMO | Admitting: Family

## 2021-02-15 ENCOUNTER — Inpatient Hospital Stay: Payer: Medicare HMO | Attending: Hematology & Oncology

## 2021-02-15 VITALS — BP 104/68 | HR 61 | Temp 98.0°F | Resp 18 | Ht 63.0 in | Wt 207.1 lb

## 2021-02-15 DIAGNOSIS — D751 Secondary polycythemia: Secondary | ICD-10-CM | POA: Insufficient documentation

## 2021-02-15 DIAGNOSIS — Z7902 Long term (current) use of antithrombotics/antiplatelets: Secondary | ICD-10-CM | POA: Insufficient documentation

## 2021-02-15 DIAGNOSIS — D500 Iron deficiency anemia secondary to blood loss (chronic): Secondary | ICD-10-CM

## 2021-02-15 DIAGNOSIS — Z9981 Dependence on supplemental oxygen: Secondary | ICD-10-CM | POA: Diagnosis not present

## 2021-02-15 DIAGNOSIS — J449 Chronic obstructive pulmonary disease, unspecified: Secondary | ICD-10-CM | POA: Diagnosis not present

## 2021-02-15 DIAGNOSIS — D45 Polycythemia vera: Secondary | ICD-10-CM

## 2021-02-15 DIAGNOSIS — E611 Iron deficiency: Secondary | ICD-10-CM | POA: Insufficient documentation

## 2021-02-15 LAB — CBC WITH DIFFERENTIAL (CANCER CENTER ONLY)
Abs Immature Granulocytes: 0.02 10*3/uL (ref 0.00–0.07)
Basophils Absolute: 0 10*3/uL (ref 0.0–0.1)
Basophils Relative: 1 %
Eosinophils Absolute: 0.1 10*3/uL (ref 0.0–0.5)
Eosinophils Relative: 3 %
HCT: 37.4 % (ref 36.0–46.0)
Hemoglobin: 11 g/dL — ABNORMAL LOW (ref 12.0–15.0)
Immature Granulocytes: 0 %
Lymphocytes Relative: 26 %
Lymphs Abs: 1.3 10*3/uL (ref 0.7–4.0)
MCH: 22.2 pg — ABNORMAL LOW (ref 26.0–34.0)
MCHC: 29.4 g/dL — ABNORMAL LOW (ref 30.0–36.0)
MCV: 75.6 fL — ABNORMAL LOW (ref 80.0–100.0)
Monocytes Absolute: 0.6 10*3/uL (ref 0.1–1.0)
Monocytes Relative: 12 %
Neutro Abs: 2.9 10*3/uL (ref 1.7–7.7)
Neutrophils Relative %: 58 %
Platelet Count: 276 10*3/uL (ref 150–400)
RBC: 4.95 MIL/uL (ref 3.87–5.11)
RDW: 17.3 % — ABNORMAL HIGH (ref 11.5–15.5)
WBC Count: 5 10*3/uL (ref 4.0–10.5)
nRBC: 0 % (ref 0.0–0.2)

## 2021-02-15 LAB — CMP (CANCER CENTER ONLY)
ALT: 16 U/L (ref 0–44)
AST: 19 U/L (ref 15–41)
Albumin: 4 g/dL (ref 3.5–5.0)
Alkaline Phosphatase: 58 U/L (ref 38–126)
Anion gap: 6 (ref 5–15)
BUN: 20 mg/dL (ref 8–23)
CO2: 30 mmol/L (ref 22–32)
Calcium: 9.5 mg/dL (ref 8.9–10.3)
Chloride: 104 mmol/L (ref 98–111)
Creatinine: 0.87 mg/dL (ref 0.44–1.00)
GFR, Estimated: >60 mL/min (ref >60)
Glucose, Bld: 90 mg/dL (ref 70–99)
Potassium: 4.5 mmol/L (ref 3.5–5.1)
Sodium: 140 mmol/L (ref 135–145)
Total Bilirubin: 0.4 mg/dL (ref 0.3–1.2)
Total Protein: 6 g/dL — ABNORMAL LOW (ref 6.5–8.1)

## 2021-02-15 LAB — IRON AND TIBC
Iron: 60 ug/dL (ref 41–142)
Saturation Ratios: 12 % — ABNORMAL LOW (ref 21–57)
TIBC: 484 ug/dL — ABNORMAL HIGH (ref 236–444)
UIBC: 424 ug/dL — ABNORMAL HIGH (ref 120–384)

## 2021-02-15 LAB — LIPID PANEL
Cholesterol: 152 mg/dL (ref 0–200)
HDL: 58 mg/dL (ref >40)
LDL Cholesterol: 58 mg/dL (ref 0–99)
Total CHOL/HDL Ratio: 2.6 RATIO
Triglycerides: 181 mg/dL — ABNORMAL HIGH (ref <150)
VLDL: 36 mg/dL (ref 0–40)

## 2021-02-15 LAB — FERRITIN: Ferritin: <4 ng/mL — ABNORMAL LOW (ref 11–307)

## 2021-02-15 NOTE — Progress Notes (Signed)
Hematology and Oncology Follow Up Visit  Natalie Hendrix [id number] [birth year] 70 y.o. 02/15/2021   Principle Diagnosis:  Secondary polycythemia vera- JAK2 negative  Current Therapy: Phlebotomy to maintain hematocrit less than 38% Plavix 75 mg by mouth daily   Interim History:  Natalie Hendrix is here today for follow-up. She is feeling fatigued and notes that her typical headaches are a little worse. She is wearing her supplemental O2 24 hours a day. She feels that her SOB is stable.  She has had some issues with her left eye and states that a recent visit to her ophthalmologist revealed that there is a delay in the left eye following with her right eye movement. She sees them again in May for follow-up.  She had a fall 2-3 weeks ago and is wearing a right knee brace. She also states that she fracture some ribs on the right side. She ambulates with a Rolator for added support.  She has been taking it easy as much as possible at home. Her husband also had a bad fall in February and is slowly recuperating.  She is doing well on Plavix and thankfully has had no issues with blood loss.  Hct today is stable at 37.4%. Iron studies pending, chronically low.  No fever, chills, n/v, cough, rash, palpitations, abdominal pain or changes in bowel or bladder habits.  She gets intermittent numbness and tingling in her fingers.  She states that her appetite is ok and she is doing her best to stay well hydrated. Her weight is stable.   ECOG Performance Status: 1 - Symptomatic but completely ambulatory  Medications:  Allergies as of 02/15/2021      Reactions   Epinephrine Palpitations   Raises heart rate   Flonase [fluticasone] Palpitations   Protriptyline Hcl Other (See Comments)   Severe constipation   Tamiflu [oseltamivir Phosphate] Other (See Comments)   "like I was having a stroke"   Tamiflu [oseltamivir] Anaphylaxis   Tizanidine Other (See Comments)   panic   Neurontin [gabapentin]  Other (See Comments)   arthralgia   Other Hives, Rash   boiron acteane   Qvar [beclomethasone] Other (See Comments)   Spiriva [tiotropium Bromide Monohydrate] Rash   Tiotropium Rash   Zonegran Other (See Comments)   Mood swings   Advair Diskus [fluticasone-salmeterol] Rash   Baclofen Rash   Chlorzoxazone Rash   Lamotrigine Rash   Levofloxacin Rash   Protriptyline Rash   Ropinirole Rash   Verapamil Rash      Medication List       Accurate as of February 15, 2021 11:12 AM. If you have any questions, ask your nurse or doctor.        albuterol 108 (90 Base) MCG/ACT inhaler Commonly known as: ProAir HFA Inhale 2 puffs into the lungs every 6 (six) hours as needed for wheezing or shortness of breath.   albuterol (2.5 MG/3ML) 0.083% nebulizer solution Commonly known as: PROVENTIL Take 3 mLs (2.5 mg total) by nebulization every 6 (six) hours as needed for wheezing or shortness of breath.   azelastine 0.1 % nasal spray Commonly known as: ASTELIN Place 1 spray into both nostrils 2 (two) times daily.   beclomethasone 42 MCG/SPRAY nasal spray Commonly known as: BECONASE-AQ 2 puffs each nostril once daily   benzonatate 100 MG capsule Commonly known as: TESSALON Take 1 capsule (100 mg total) by mouth every 6 (six) hours as needed for cough.   buPROPion 300 MG 24 hr tablet Commonly known as:  WELLBUTRIN XL Take 1 tablet (300 mg total) by mouth daily with breakfast.   CALCIUM 1200 PO Take 1,250 mg by mouth daily.   Centrum Silver tablet Take 1 tablet by mouth daily.   clopidogrel 75 MG tablet Commonly known as: PLAVIX Take 1 tablet (75 mg total) by mouth daily.   cyclobenzaprine 10 MG tablet Commonly known as: FLEXERIL Take 10 mg by mouth every 8 (eight) hours as needed.   ESTRACE VAGINAL 0.1 MG/GM vaginal cream Generic drug: estradiol Place 1 Applicatorful vaginally daily. Small amount each dose per pt   ezetimibe 10 MG tablet Commonly known as: Zetia TAKE 1 TABLET  BY MOUTH DAILY.   fexofenadine 180 MG tablet Commonly known as: Allegra Allergy Take 1 tablet (180 mg total) by mouth daily.   Fish Oil 1200 MG Caps Take 1,200 mg by mouth at bedtime.   hydrOXYzine 10 MG tablet Commonly known as: ATARAX/VISTARIL Take 10 mg by mouth 3 (three) times daily as needed.   hyoscyamine 0.125 MG Tbdp disintergrating tablet Commonly known as: ANASPAZ Place 1 tablet (0.125 mg total) under the tongue 2 (two) times daily as needed. What changed: reasons to take this   lidocaine 5 % Commonly known as: LIDODERM Place 1 patch onto the skin as needed (pain). Remove & Discard patch within 12 hours or as directed by MD   LORazepam 1 MG tablet Commonly known as: ATIVAN Take 1 tablet (1 mg total) by mouth 2 (two) times daily. May take extra 3rd dose if needed What changed:   when to take this  reasons to take this   magnesium gluconate 500 MG tablet Commonly known as: MAGONATE Take 500 mg by mouth daily as needed (constipation).   meclizine 25 MG tablet Commonly known as: ANTIVERT Take 1 tablet (25 mg total) by mouth 3 (three) times daily as needed for dizziness. 3 month supply What changed: when to take this   metoprolol succinate 50 MG 24 hr tablet Commonly known as: TOPROL-XL Take 1 tablet (50 mg total) by mouth daily with breakfast. What changed: how much to take   montelukast 10 MG tablet Commonly known as: SINGULAIR TAKE ONE TABLET BY MOUTH EVERY DAY   morphine 15 MG tablet Commonly known as: MSIR Take 15 mg by mouth every 4 (four) hours as needed for severe pain.   morphine 30 MG 12 hr tablet Commonly known as: MS CONTIN Take 1 tablet by mouth every 12 (twelve) hours.   Nebulizer Devi Use as directed   OXYGEN Inhale 2.5 L/hr into the lungs continuous.   pantoprazole 40 MG tablet Commonly known as: PROTONIX TAKE 1 TABLET (40 MG TOTAL) BY MOUTH DAILY.   Polyethyl Glycol-Propyl Glycol 0.4-0.3 % Soln Apply 1 drop to eye daily as  needed (dry eyes).   pravastatin 40 MG tablet Commonly known as: PRAVACHOL Take 1 tablet (40 mg total) by mouth at bedtime.   promethazine 25 MG tablet Commonly known as: PHENERGAN Take 25 mg by mouth every 8 (eight) hours as needed for nausea or vomiting.   TART CHERRY ADVANCED PO Take 5 mLs by mouth daily. Juice Concentrate- 1tsp daily   Zovirax 5 % Generic drug: acyclovir ointment Apply 1 application topically as needed (flair).       Allergies:  Allergies  Allergen Reactions  . Epinephrine Palpitations    Raises heart rate   . Flonase [Fluticasone] Palpitations  . Protriptyline Hcl Other (See Comments)    Severe constipation  . Tamiflu [Oseltamivir Phosphate] Other (  See Comments)    "like I was having a stroke"  . Tamiflu [Oseltamivir] Anaphylaxis  . Tizanidine Other (See Comments)    panic  . Neurontin [Gabapentin] Other (See Comments)    arthralgia  . Other Hives and Rash    boiron acteane  . Qvar [Beclomethasone] Other (See Comments)  . Spiriva [Tiotropium Bromide Monohydrate] Rash  . Tiotropium Rash  . Zonegran Other (See Comments)    Mood swings  . Advair Diskus [Fluticasone-Salmeterol] Rash  . Baclofen Rash  . Chlorzoxazone Rash  . Lamotrigine Rash  . Levofloxacin Rash  . Protriptyline Rash  . Ropinirole Rash  . Verapamil Rash    Past Medical History, Surgical history, Social history, and Family History were reviewed and updated.  Review of Systems: All other 10 point review of systems is negative.   Physical Exam:  height is 5\' 3"  (1.6 m) and weight is 207 lb 1.9 oz (93.9 kg). Her temperature is 98 F (36.7 C). Her blood pressure is 104/68 and her pulse is 61. Her respiration is 18 and oxygen saturation is 98%.   Wt Readings from Last 3 Encounters:  02/15/21 207 lb 1.9 oz (93.9 kg)  01/04/21 208 lb (94.3 kg)  11/22/20 203 lb 6.4 oz (92.3 kg)    Ocular: Sclerae unicteric, pupils equal, round and reactive to light Ear-nose-throat:  Oropharynx clear, dentition fair Lymphatic: No cervical or supraclavicular adenopathy Lungs no rales or rhonchi, good excursion bilaterally Heart regular rate and rhythm, no murmur appreciated Abd soft, nontender, positive bowel sounds MSK no focal spinal tenderness, no joint edema, she tenderness in the right lower rib cage from fall Neuro: non-focal, well-oriented, appropriate affect Breasts: Deferred   Lab Results  Component Value Date   WBC 5.0 02/15/2021   HGB 11.0 (L) 02/15/2021   HCT 37.4 02/15/2021   MCV 75.6 (L) 02/15/2021   PLT 276 02/15/2021   Lab Results  Component Value Date   FERRITIN 4 (L) 01/04/2021   IRON 61 01/04/2021   TIBC 485 (H) 01/04/2021   UIBC 424 (H) 01/04/2021   IRONPCTSAT 13 (L) 01/04/2021   Lab Results  Component Value Date   RETICCTPCT 1.1 04/27/2015   RBC 4.95 02/15/2021   RETICCTABS 55.1 04/27/2015   No results found for: KPAFRELGTCHN, LAMBDASER, KAPLAMBRATIO No results found for: IGGSERUM, IGA, IGMSERUM No results found for: Odetta Pink, SPEI   Chemistry      Component Value Date/Time   NA 140 02/15/2021 1016   NA 147 (H) 10/08/2017 1410   NA 141 02/17/2017 1132   K 4.5 02/15/2021 1016   K 4.6 10/08/2017 1410   K 5.3 (H) 02/17/2017 1132   CL 104 02/15/2021 1016   CL 106 10/08/2017 1410   CO2 30 02/15/2021 1016   CO2 30 10/08/2017 1410   CO2 29 02/17/2017 1132   BUN 20 02/15/2021 1016   BUN 15 10/08/2017 1410   BUN 17.8 02/17/2017 1132   CREATININE 0.87 02/15/2021 1016   CREATININE 1.0 10/08/2017 1410   CREATININE 0.8 02/17/2017 1132      Component Value Date/Time   CALCIUM 9.5 02/15/2021 1016   CALCIUM 9.0 10/08/2017 1410   CALCIUM 9.3 02/17/2017 1132   ALKPHOS 58 02/15/2021 1016   ALKPHOS 59 10/08/2017 1410   ALKPHOS 79 02/17/2017 1132   AST 19 02/15/2021 1016   AST 22 02/17/2017 1132   ALT 16 02/15/2021 1016   ALT 26 10/08/2017 1410   ALT 20  02/17/2017 1132    BILITOT 0.4 02/15/2021 1016   BILITOT 0.39 02/17/2017 1132       Impression and Plan: Natalie Hendrix is a very pleasant 70yo caucasian female with secondary polycythemia (JAK-2 negative) due to COPD. No phlebotomy needed, Hct 37.4.  Follow-up in 1 months.  She was encouraged to contact our office with any questions or concerns.   [name], NP 3/17/202211:12 AM

## 2021-02-21 ENCOUNTER — Encounter: Payer: Self-pay | Admitting: Family

## 2021-03-02 ENCOUNTER — Other Ambulatory Visit: Payer: Self-pay

## 2021-03-02 DIAGNOSIS — D45 Polycythemia vera: Secondary | ICD-10-CM

## 2021-03-02 DIAGNOSIS — Z8673 Personal history of transient ischemic attack (TIA), and cerebral infarction without residual deficits: Secondary | ICD-10-CM

## 2021-03-02 MED ORDER — CLOPIDOGREL BISULFATE 75 MG PO TABS: 75.0000 mg | ORAL_TABLET | Freq: Every day | ORAL | 1 refills | Status: DC

## 2021-03-14 ENCOUNTER — Telehealth: Payer: Self-pay | Admitting: *Deleted

## 2021-03-14 ENCOUNTER — Inpatient Hospital Stay: Payer: Medicare HMO | Attending: Hematology & Oncology

## 2021-03-14 ENCOUNTER — Other Ambulatory Visit: Payer: Self-pay

## 2021-03-14 ENCOUNTER — Inpatient Hospital Stay: Payer: Medicare HMO

## 2021-03-14 ENCOUNTER — Encounter: Payer: Self-pay | Admitting: Family

## 2021-03-14 ENCOUNTER — Inpatient Hospital Stay (HOSPITAL_BASED_OUTPATIENT_CLINIC_OR_DEPARTMENT_OTHER): Payer: Medicare HMO | Admitting: Family

## 2021-03-14 VITALS — BP 108/78 | HR 75 | Resp 17

## 2021-03-14 VITALS — BP 140/78 | HR 81 | Temp 99.4°F | Resp 18 | Ht 63.0 in | Wt 205.1 lb

## 2021-03-14 DIAGNOSIS — D45 Polycythemia vera: Secondary | ICD-10-CM | POA: Diagnosis not present

## 2021-03-14 DIAGNOSIS — D509 Iron deficiency anemia, unspecified: Secondary | ICD-10-CM | POA: Insufficient documentation

## 2021-03-14 DIAGNOSIS — D751 Secondary polycythemia: Secondary | ICD-10-CM

## 2021-03-14 DIAGNOSIS — D500 Iron deficiency anemia secondary to blood loss (chronic): Secondary | ICD-10-CM

## 2021-03-14 LAB — CMP (CANCER CENTER ONLY)
ALT: 18 U/L (ref 0–44)
AST: 20 U/L (ref 15–41)
Albumin: 4.1 g/dL (ref 3.5–5.0)
Alkaline Phosphatase: 60 U/L (ref 38–126)
Anion gap: 5 (ref 5–15)
BUN: 23 mg/dL (ref 8–23)
CO2: 32 mmol/L (ref 22–32)
Calcium: 9.5 mg/dL (ref 8.9–10.3)
Chloride: 105 mmol/L (ref 98–111)
Creatinine: 0.83 mg/dL (ref 0.44–1.00)
GFR, Estimated: >60 mL/min (ref >60)
Glucose, Bld: 84 mg/dL (ref 70–99)
Potassium: 5 mmol/L (ref 3.5–5.1)
Sodium: 142 mmol/L (ref 135–145)
Total Bilirubin: 0.4 mg/dL (ref 0.3–1.2)
Total Protein: 6.4 g/dL — ABNORMAL LOW (ref 6.5–8.1)

## 2021-03-14 LAB — CBC WITH DIFFERENTIAL (CANCER CENTER ONLY)
Abs Immature Granulocytes: 0.01 10*3/uL (ref 0.00–0.07)
Basophils Absolute: 0.1 10*3/uL (ref 0.0–0.1)
Basophils Relative: 1 %
Eosinophils Absolute: 0.2 10*3/uL (ref 0.0–0.5)
Eosinophils Relative: 3 %
HCT: 40.1 % (ref 36.0–46.0)
Hemoglobin: 11.8 g/dL — ABNORMAL LOW (ref 12.0–15.0)
Immature Granulocytes: 0 %
Lymphocytes Relative: 25 %
Lymphs Abs: 1.3 10*3/uL (ref 0.7–4.0)
MCH: 22.6 pg — ABNORMAL LOW (ref 26.0–34.0)
MCHC: 29.4 g/dL — ABNORMAL LOW (ref 30.0–36.0)
MCV: 76.8 fL — ABNORMAL LOW (ref 80.0–100.0)
Monocytes Absolute: 0.6 10*3/uL (ref 0.1–1.0)
Monocytes Relative: 11 %
Neutro Abs: 3.2 10*3/uL (ref 1.7–7.7)
Neutrophils Relative %: 60 %
Platelet Count: 278 10*3/uL (ref 150–400)
RBC: 5.22 MIL/uL — ABNORMAL HIGH (ref 3.87–5.11)
RDW: 17.8 % — ABNORMAL HIGH (ref 11.5–15.5)
WBC Count: 5.3 10*3/uL (ref 4.0–10.5)
nRBC: 0 % (ref 0.0–0.2)

## 2021-03-14 LAB — IRON AND TIBC
Iron: 47 ug/dL (ref 41–142)
Saturation Ratios: 10 % — ABNORMAL LOW (ref 21–57)
TIBC: 494 ug/dL — ABNORMAL HIGH (ref 236–444)
UIBC: 447 ug/dL — ABNORMAL HIGH (ref 120–384)

## 2021-03-14 LAB — FERRITIN: Ferritin: 4 ng/mL — ABNORMAL LOW (ref 11–307)

## 2021-03-14 MED ORDER — SODIUM CHLORIDE 0.9 % IV SOLN
Freq: Once | INTRAVENOUS | Status: AC
Start: 2021-03-14 — End: 2021-03-14
  Filled 2021-03-14: qty 250

## 2021-03-14 NOTE — Progress Notes (Signed)
Natalie Hendrix presents today for phlebotomy per MD orders. Phlebotomy procedure started at 1142 and ended at 1158. 505 grams removed via 20 gauge needle to Right AC. Patient observed for 30 minutes after procedure without any incident. Pt received post phlebotomy fluids and snacks. Patient tolerated procedure well. IV needle removed intact.

## 2021-03-14 NOTE — Patient Instructions (Signed)
Therapeutic Phlebotomy Therapeutic phlebotomy is the planned removal of blood from a person's body for the purpose of treating a medical condition. The procedure is similar to donating blood. Usually, about a pint (470 mL, or 0.47 L) of blood is removed. The average adult has 9-12 pints (4.3-5.7 L) of blood in the body. Therapeutic phlebotomy may be used to treat the following medical conditions:  Hemochromatosis. This is a condition in which the blood contains too much iron.  Polycythemia vera. This is a condition in which the blood contains too many red blood cells.  Porphyria cutanea tarda. This is a disease in which an important part of hemoglobin is not made properly. It results in the buildup of abnormal amounts of porphyrins in the body.  Sickle cell disease. This is a condition in which the red blood cells form an abnormal crescent shape rather than a round shape. Tell a health care provider about:  Any allergies you have.  All medicines you are taking, including vitamins, herbs, eye drops, creams, and over-the-counter medicines.  Any problems you or family members have had with anesthetic medicines.  Any blood disorders you have.  Any surgeries you have had.  Any medical conditions you have.  Whether you are pregnant or may be pregnant. What are the risks? Generally, this is a safe procedure. However, problems may occur, including:  Nausea or light-headedness.  Low blood pressure (hypotension).  Soreness, bleeding, swelling, or bruising at the needle insertion site.  Infection. What happens before the procedure?  Follow instructions from your health care provider about eating or drinking restrictions.  Ask your health care provider about: ? Changing or stopping your regular medicines. This is especially important if you are taking diabetes medicines or blood thinners (anticoagulants). ? Taking medicines such as aspirin and ibuprofen. These medicines can thin your  blood. Do not take these medicines unless your health care provider tells you to take them. ? Taking over-the-counter medicines, vitamins, herbs, and supplements.  Wear clothing with sleeves that can be raised above the elbow.  Plan to have someone take you home from the hospital or clinic.  You may have a blood sample taken.  Your blood pressure, pulse rate, and breathing rate will be measured. What happens during the procedure?  To lower your risk of infection: ? Your health care team will wash or sanitize their hands. ? Your skin will be cleaned with an antiseptic.  You may be given a medicine to numb the area (local anesthetic).  A tourniquet will be placed on your arm.  A needle will be inserted into one of your veins.  Tubing and a collection bag will be attached to that needle.  Blood will flow through the needle and tubing into the collection bag.  The collection bag will be placed lower than your arm to allow gravity to help the flow of blood into the bag.  You may be asked to open and close your hand slowly and continually during the entire collection.  After the specified amount of blood has been removed from your body, the collection bag and tubing will be clamped.  The needle will be removed from your vein.  Pressure will be held on the site of the needle insertion to stop the bleeding.  A bandage (dressing) will be placed over the needle insertion site. The procedure may vary among health care providers and hospitals.   What happens after the procedure?  Your blood pressure, pulse rate, and breathing rate will   be measured after the procedure.  You will be encouraged to drink fluids.  Your recovery will be assessed and monitored.  You can return to your normal activities as told by your health care provider. Summary  Therapeutic phlebotomy is the planned removal of blood from a person's body for the purpose of treating a medical condition.  Therapeutic  phlebotomy may be used to treat hemochromatosis, polycythemia vera, porphyria cutanea tarda, or sickle cell disease.  In the procedure, a needle is inserted and about a pint (470 mL, or 0.47 L) of blood is removed. The average adult has 9-12 pints (4.3-5.7 L) of blood in the body.  This is generally a safe procedure, but it can sometimes cause problems such as nausea, light-headedness, or low blood pressure (hypotension). This information is not intended to replace advice given to you by your health care provider. Make sure you discuss any questions you have with your health care provider. Document Revised: 12/04/2017 Document Reviewed: 12/04/2017 Elsevier Patient Education  2021 [employer]  

## 2021-03-14 NOTE — Telephone Encounter (Signed)
Per los 03/14/21 called patient and gave upcoming appointments - mailed calendar

## 2021-03-14 NOTE — Progress Notes (Signed)
Hematology and Oncology Follow Up Visit  [name] [id number] [birth year] 70 y.o. 03/14/2021   Principle Diagnosis:  Secondary polycythemia vera - JAK2 negative  Current Therapy: Phlebotomy to maintain hematocrit less than 38% Plavix 75 mg by mouth daily   Interim History:  [name] is here today for follow-up and phlebotomy for Hct 40.1%. She is having fatigue and has noted some issues with word recall. This has been going on for a while.  She is working hard to stay well hydrated and is supplementing with carnation instant breakfast to get her calories.  She is doing well on 2L supplemental O2 24 hours a day. SOB is stable.  She has occasional episodes of dizziness.  No issue with recent infection. No fever, chills, n/v, cough, rash, chest pain, palpitations, abdominal pain or changes in bowel or bladder habits.  No episodes of bleeding. No bruising or petechiae.  No swelling in her extremities.  She has numbness and tingling in her extremities that comes and goes.  No new falls or syncope to report.   ECOG Performance Status: 1 - Symptomatic but completely ambulatory  Medications:  Allergies as of 03/14/2021      Reactions   Epinephrine Palpitations   Raises heart rate   Flonase [fluticasone] Palpitations   Protriptyline Hcl Other (See Comments)   Severe constipation   Tamiflu [oseltamivir Phosphate] Other (See Comments)   "like I was having a stroke"   Tamiflu [oseltamivir] Anaphylaxis   Tizanidine Other (See Comments)   panic   Neurontin [gabapentin] Other (See Comments)   arthralgia   Other Hives, Rash   boiron acteane   Qvar [beclomethasone] Other (See Comments)   Spiriva [tiotropium Bromide Monohydrate] Rash   Tiotropium Rash   Zonegran Other (See Comments)   Mood swings   Advair Diskus [fluticasone-salmeterol] Rash   Baclofen Rash   Chlorzoxazone Rash   Lamotrigine Rash   Levofloxacin Rash   Protriptyline Rash   Ropinirole Rash   Verapamil  Rash      Medication List       Accurate as of March 14, 2021 10:41 AM. If you have any questions, ask your nurse or doctor.        albuterol 108 (90 Base) MCG/ACT inhaler Commonly known as: ProAir HFA Inhale 2 puffs into the lungs every 6 (six) hours as needed for wheezing or shortness of breath.   albuterol (2.5 MG/3ML) 0.083% nebulizer solution Commonly known as: PROVENTIL Take 3 mLs (2.5 mg total) by nebulization every 6 (six) hours as needed for wheezing or shortness of breath.   azelastine 0.1 % nasal spray Commonly known as: ASTELIN Place 1 spray into both nostrils 2 (two) times daily.   beclomethasone 42 MCG/SPRAY nasal spray Commonly known as: BECONASE-AQ 2 puffs each nostril once daily   benzonatate 100 MG capsule Commonly known as: TESSALON Take 1 capsule (100 mg total) by mouth every 6 (six) hours as needed for cough.   buPROPion 300 MG 24 hr tablet Commonly known as: WELLBUTRIN XL Take 1 tablet (300 mg total) by mouth daily with breakfast.   CALCIUM 1200 PO Take 1,250 mg by mouth daily.   Centrum Silver tablet Take 1 tablet by mouth daily.   clopidogrel 75 MG tablet Commonly known as: PLAVIX Take 1 tablet (75 mg total) by mouth daily.   cyclobenzaprine 10 MG tablet Commonly known as: FLEXERIL Take 10 mg by mouth every 8 (eight) hours as needed.   ESTRACE VAGINAL 0.1 MG/GM vaginal cream  Generic drug: estradiol Place 1 Applicatorful vaginally daily. Small amount each dose per pt   ezetimibe 10 MG tablet Commonly known as: Zetia TAKE 1 TABLET BY MOUTH DAILY.   fexofenadine 180 MG tablet Commonly known as: Allegra Allergy Take 1 tablet (180 mg total) by mouth daily.   Fish Oil 1200 MG Caps Take 1,200 mg by mouth at bedtime.   hydrOXYzine 10 MG tablet Commonly known as: ATARAX/VISTARIL Take 10 mg by mouth 3 (three) times daily as needed.   hyoscyamine 0.125 MG Tbdp disintergrating tablet Commonly known as: ANASPAZ Place 1 tablet (0.125 mg  total) under the tongue 2 (two) times daily as needed. What changed: reasons to take this   lidocaine 5 % Commonly known as: LIDODERM Place 1 patch onto the skin as needed (pain). Remove & Discard patch within 12 hours or as directed by MD   LORazepam 1 MG tablet Commonly known as: ATIVAN Take 1 tablet (1 mg total) by mouth 2 (two) times daily. May take extra 3rd dose if needed What changed:   when to take this  reasons to take this   magnesium gluconate 500 MG tablet Commonly known as: MAGONATE Take 500 mg by mouth daily as needed (constipation).   meclizine 25 MG tablet Commonly known as: ANTIVERT Take 1 tablet (25 mg total) by mouth 3 (three) times daily as needed for dizziness. 3 month supply What changed: when to take this   metoprolol succinate 50 MG 24 hr tablet Commonly known as: TOPROL-XL Take 1 tablet (50 mg total) by mouth daily with breakfast. What changed: how much to take   montelukast 10 MG tablet Commonly known as: SINGULAIR TAKE ONE TABLET BY MOUTH EVERY DAY   morphine 15 MG tablet Commonly known as: MSIR Take 15 mg by mouth every 4 (four) hours as needed for severe pain.   morphine 30 MG 12 hr tablet Commonly known as: MS CONTIN Take 1 tablet by mouth every 12 (twelve) hours.   Nebulizer Devi Use as directed   OXYGEN Inhale 2.5 L/hr into the lungs continuous.   pantoprazole 40 MG tablet Commonly known as: PROTONIX TAKE 1 TABLET (40 MG TOTAL) BY MOUTH DAILY.   Polyethyl Glycol-Propyl Glycol 0.4-0.3 % Soln Apply 1 drop to eye daily as needed (dry eyes).   pravastatin 40 MG tablet Commonly known as: PRAVACHOL Take 1 tablet (40 mg total) by mouth at bedtime.   promethazine 25 MG tablet Commonly known as: PHENERGAN Take 25 mg by mouth every 8 (eight) hours as needed for nausea or vomiting.   TART CHERRY ADVANCED PO Take 5 mLs by mouth daily. Juice Concentrate- 1tsp daily   Zovirax 5 % Generic drug: acyclovir ointment Apply 1 application  topically as needed (flair).       Allergies:  Allergies  Allergen Reactions  . Epinephrine Palpitations    Raises heart rate   . Flonase [Fluticasone] Palpitations  . Protriptyline Hcl Other (See Comments)    Severe constipation  . Tamiflu [Oseltamivir Phosphate] Other (See Comments)    "like I was having a stroke"  . Tamiflu [Oseltamivir] Anaphylaxis  . Tizanidine Other (See Comments)    panic  . Neurontin [Gabapentin] Other (See Comments)    arthralgia  . Other Hives and Rash    boiron acteane  . Qvar [Beclomethasone] Other (See Comments)  . Spiriva [Tiotropium Bromide Monohydrate] Rash  . Tiotropium Rash  . Zonegran Other (See Comments)    Mood swings  . Advair Diskus [Fluticasone-Salmeterol] Rash  .  Baclofen Rash  . Chlorzoxazone Rash  . Lamotrigine Rash  . Levofloxacin Rash  . Protriptyline Rash  . Ropinirole Rash  . Verapamil Rash    Past Medical History, Surgical history, Social history, and Family History were reviewed and updated.  Review of Systems: All other 10 point review of systems is negative.   Physical Exam:  vitals were not taken for this visit.   Wt Readings from Last 3 Encounters:  02/15/21 207 lb 1.9 oz (93.9 kg)  01/04/21 208 lb (94.3 kg)  11/22/20 203 lb 6.4 oz (92.3 kg)    Ocular: Sclerae unicteric, pupils equal, round and reactive to light Ear-nose-throat: Oropharynx clear, dentition fair Lymphatic: No cervical or supraclavicular adenopathy Lungs no rales or rhonchi, good excursion bilaterally Heart regular rate and rhythm, no murmur appreciated Abd soft, nontender, positive bowel sounds MSK no focal spinal tenderness, no joint edema Neuro: non-focal, well-oriented, appropriate affect Breasts: Deferred   Lab Results  Component Value Date   WBC 5.3 03/14/2021   HGB 11.8 (L) 03/14/2021   HCT 40.1 03/14/2021   MCV 76.8 (L) 03/14/2021   PLT 278 03/14/2021   Lab Results  Component Value Date   FERRITIN <4 (L) 02/15/2021    IRON 60 02/15/2021   TIBC 484 (H) 02/15/2021   UIBC 424 (H) 02/15/2021   IRONPCTSAT 12 (L) 02/15/2021   Lab Results  Component Value Date   RETICCTPCT 1.1 04/27/2015   RBC 5.22 (H) 03/14/2021   RETICCTABS 55.1 04/27/2015   No results found for: KPAFRELGTCHN, LAMBDASER, KAPLAMBRATIO No results found for: IGGSERUM, IGA, IGMSERUM No results found for: Odetta Pink, SPEI   Chemistry      Component Value Date/Time   NA 140 02/15/2021 1016   NA 147 (H) 10/08/2017 1410   NA 141 02/17/2017 1132   K 4.5 02/15/2021 1016   K 4.6 10/08/2017 1410   K 5.3 (H) 02/17/2017 1132   CL 104 02/15/2021 1016   CL 106 10/08/2017 1410   CO2 30 02/15/2021 1016   CO2 30 10/08/2017 1410   CO2 29 02/17/2017 1132   BUN 20 02/15/2021 1016   BUN 15 10/08/2017 1410   BUN 17.8 02/17/2017 1132   CREATININE 0.87 02/15/2021 1016   CREATININE 1.0 10/08/2017 1410   CREATININE 0.8 02/17/2017 1132      Component Value Date/Time   CALCIUM 9.5 02/15/2021 1016   CALCIUM 9.0 10/08/2017 1410   CALCIUM 9.3 02/17/2017 1132   ALKPHOS 58 02/15/2021 1016   ALKPHOS 59 10/08/2017 1410   ALKPHOS 79 02/17/2017 1132   AST 19 02/15/2021 1016   AST 22 02/17/2017 1132   ALT 16 02/15/2021 1016   ALT 26 10/08/2017 1410   ALT 20 02/17/2017 1132   BILITOT 0.4 02/15/2021 1016   BILITOT 0.39 02/17/2017 1132       Impression and Plan: Ms. Boom is a very pleasant 70yo caucasian female with secondary polycythemia (JAK-2 negative) due to COPD. She had a phlebotomy today and received replacement fluids for Hct 40.1%.  Follow-up in 1 month.  She can contact our office with any questions or concerns.   [name], NP 4/13/202210:41 AM

## 2021-03-21 ENCOUNTER — Encounter: Payer: Self-pay | Admitting: Hematology & Oncology

## 2021-04-11 ENCOUNTER — Other Ambulatory Visit: Payer: Self-pay

## 2021-04-11 ENCOUNTER — Telehealth: Payer: Self-pay

## 2021-04-11 ENCOUNTER — Inpatient Hospital Stay: Payer: Medicare HMO | Attending: Hematology & Oncology

## 2021-04-11 ENCOUNTER — Inpatient Hospital Stay (HOSPITAL_BASED_OUTPATIENT_CLINIC_OR_DEPARTMENT_OTHER): Payer: Medicare HMO | Admitting: Family

## 2021-04-11 ENCOUNTER — Inpatient Hospital Stay: Payer: Medicare HMO

## 2021-04-11 DIAGNOSIS — Z7902 Long term (current) use of antithrombotics/antiplatelets: Secondary | ICD-10-CM | POA: Insufficient documentation

## 2021-04-11 DIAGNOSIS — R002 Palpitations: Secondary | ICD-10-CM | POA: Diagnosis not present

## 2021-04-11 DIAGNOSIS — D500 Iron deficiency anemia secondary to blood loss (chronic): Secondary | ICD-10-CM | POA: Insufficient documentation

## 2021-04-11 DIAGNOSIS — R0602 Shortness of breath: Secondary | ICD-10-CM | POA: Insufficient documentation

## 2021-04-11 DIAGNOSIS — D751 Secondary polycythemia: Secondary | ICD-10-CM | POA: Diagnosis present

## 2021-04-11 LAB — CBC WITH DIFFERENTIAL (CANCER CENTER ONLY)
Abs Immature Granulocytes: 0.02 10*3/uL (ref 0.00–0.07)
Basophils Absolute: 0 10*3/uL (ref 0.0–0.1)
Basophils Relative: 1 %
Eosinophils Absolute: 0.2 10*3/uL (ref 0.0–0.5)
Eosinophils Relative: 3 %
HCT: 37.6 % (ref 36.0–46.0)
Hemoglobin: 11.2 g/dL — ABNORMAL LOW (ref 12.0–15.0)
Immature Granulocytes: 0 %
Lymphocytes Relative: 29 %
Lymphs Abs: 1.5 10*3/uL (ref 0.7–4.0)
MCH: 22.5 pg — ABNORMAL LOW (ref 26.0–34.0)
MCHC: 29.8 g/dL — ABNORMAL LOW (ref 30.0–36.0)
MCV: 75.5 fL — ABNORMAL LOW (ref 80.0–100.0)
Monocytes Absolute: 0.7 10*3/uL (ref 0.1–1.0)
Monocytes Relative: 13 %
Neutro Abs: 2.9 10*3/uL (ref 1.7–7.7)
Neutrophils Relative %: 54 %
Platelet Count: 253 10*3/uL (ref 150–400)
RBC: 4.98 MIL/uL (ref 3.87–5.11)
RDW: 16.8 % — ABNORMAL HIGH (ref 11.5–15.5)
WBC Count: 5.3 10*3/uL (ref 4.0–10.5)
nRBC: 0 % (ref 0.0–0.2)

## 2021-04-11 LAB — CMP (CANCER CENTER ONLY)
ALT: 17 U/L (ref 0–44)
AST: 19 U/L (ref 15–41)
Albumin: 4 g/dL (ref 3.5–5.0)
Alkaline Phosphatase: 58 U/L (ref 38–126)
Anion gap: 6 (ref 5–15)
BUN: 21 mg/dL (ref 8–23)
CO2: 28 mmol/L (ref 22–32)
Calcium: 9.4 mg/dL (ref 8.9–10.3)
Chloride: 106 mmol/L (ref 98–111)
Creatinine: 0.79 mg/dL (ref 0.44–1.00)
GFR, Estimated: >60 mL/min (ref >60)
Glucose, Bld: 94 mg/dL (ref 70–99)
Potassium: 4.4 mmol/L (ref 3.5–5.1)
Sodium: 140 mmol/L (ref 135–145)
Total Bilirubin: 0.3 mg/dL (ref 0.3–1.2)
Total Protein: 6.1 g/dL — ABNORMAL LOW (ref 6.5–8.1)

## 2021-04-11 NOTE — Telephone Encounter (Signed)
appts made and calendar printed for pt per los  Natalie Hendrix

## 2021-04-11 NOTE — Progress Notes (Signed)
Hematology and Oncology Follow Up Visit  Natalie Hendrix [id number] Nov 11, 1951 70 y.o. 04/11/2021   Principle Diagnosis:  Secondary polycythemia - JAK2 negative  Current Therapy: Phlebotomy to maintain hematocrit less than 38% Plavix 75 mg by mouth daily   Interim History:  Ms. Closson is here today for follow-up. Hct is stable at 37.6%. She is doing well and has been able to get out in her yard to garden.  She has fatigue and dizziness at times. She also notes occasional palpitations and will take a break to rest when needed.  No blood loss noted. No bruising or petechiae.  SOB is stable at 2L  supplemental O2  No fever, chills, n/v, cough, rash, abdominal pain or changes in bowel or bladder habits.  No swelling, tenderness, numbness or tingling in her extremities.  No falls or syncope to report at this time.   ECOG Performance Status: 1 - Symptomatic but completely ambulatory  Medications:  Allergies as of 04/11/2021      Reactions   Epinephrine Palpitations   Raises heart rate   Flonase [fluticasone] Palpitations   Protriptyline Hcl Other (See Comments)   Severe constipation   Tamiflu [oseltamivir Phosphate] Other (See Comments)   "like I was having a stroke"   Tamiflu [oseltamivir] Anaphylaxis   Tizanidine Other (See Comments)   panic   Neurontin [gabapentin] Other (See Comments)   arthralgia   Other Hives, Rash   boiron acteane   Qvar [beclomethasone] Other (See Comments)   Spiriva [tiotropium Bromide Monohydrate] Rash   Tiotropium Rash   Zonegran Other (See Comments)   Mood swings   Advair Diskus [fluticasone-salmeterol] Rash   Baclofen Rash   Chlorzoxazone Rash   Lamotrigine Rash   Levofloxacin Rash   Protriptyline Rash   Ropinirole Rash   Verapamil Rash      Medication List       Accurate as of Apr 11, 2021 10:52 AM. If you have any questions, ask your nurse or doctor.        albuterol 108 (90 Base) MCG/ACT inhaler Commonly known as:  ProAir HFA Inhale 2 puffs into the lungs every 6 (six) hours as needed for wheezing or shortness of breath.   albuterol (2.5 MG/3ML) 0.083% nebulizer solution Commonly known as: PROVENTIL Take 3 mLs (2.5 mg total) by nebulization every 6 (six) hours as needed for wheezing or shortness of breath.   azelastine 0.1 % nasal spray Commonly known as: ASTELIN Place 1 spray into both nostrils 2 (two) times daily.   beclomethasone 42 MCG/SPRAY nasal spray Commonly known as: BECONASE-AQ 2 puffs each nostril once daily   benzonatate 100 MG capsule Commonly known as: TESSALON Take 1 capsule (100 mg total) by mouth every 6 (six) hours as needed for cough.   buPROPion 300 MG 24 hr tablet Commonly known as: WELLBUTRIN XL Take 1 tablet (300 mg total) by mouth daily with breakfast.   CALCIUM 1200 PO Take 1,250 mg by mouth daily.   Centrum Silver tablet Take 1 tablet by mouth daily.   clopidogrel 75 MG tablet Commonly known as: PLAVIX Take 1 tablet (75 mg total) by mouth daily.   cyclobenzaprine 10 MG tablet Commonly known as: FLEXERIL Take 10 mg by mouth every 8 (eight) hours as needed.   ESTRACE VAGINAL 0.1 MG/GM vaginal cream Generic drug: estradiol Place 1 Applicatorful vaginally daily. Small amount each dose per pt   ezetimibe 10 MG tablet Commonly known as: Zetia TAKE 1 TABLET BY MOUTH DAILY.  fexofenadine 180 MG tablet Commonly known as: Allegra Allergy Take 1 tablet (180 mg total) by mouth daily.   Fish Oil 1200 MG Caps Take 1,200 mg by mouth at bedtime.   hydrOXYzine 10 MG tablet Commonly known as: ATARAX/VISTARIL Take 10 mg by mouth 3 (three) times daily as needed.   hyoscyamine 0.125 MG Tbdp disintergrating tablet Commonly known as: ANASPAZ Place 1 tablet (0.125 mg total) under the tongue 2 (two) times daily as needed. What changed: reasons to take this   lidocaine 5 % Commonly known as: LIDODERM Place 1 patch onto the skin as needed (pain). Remove & Discard  patch within 12 hours or as directed by MD   LORazepam 1 MG tablet Commonly known as: ATIVAN Take 1 tablet (1 mg total) by mouth 2 (two) times daily. May take extra 3rd dose if needed What changed:   when to take this  reasons to take this   magnesium gluconate 500 MG tablet Commonly known as: MAGONATE Take 500 mg by mouth daily as needed (constipation).   meclizine 25 MG tablet Commonly known as: ANTIVERT Take 1 tablet (25 mg total) by mouth 3 (three) times daily as needed for dizziness. 3 month supply What changed: when to take this   metoprolol succinate 50 MG 24 hr tablet Commonly known as: TOPROL-XL Take 1 tablet (50 mg total) by mouth daily with breakfast. What changed: how much to take   montelukast 10 MG tablet Commonly known as: SINGULAIR TAKE ONE TABLET BY MOUTH EVERY DAY   morphine 15 MG tablet Commonly known as: MSIR Take 15 mg by mouth every 4 (four) hours as needed for severe pain.   morphine 30 MG 12 hr tablet Commonly known as: MS CONTIN Take 1 tablet by mouth every 12 (twelve) hours.   Nebulizer Devi Use as directed   OXYGEN Inhale 2.5 L/hr into the lungs continuous.   pantoprazole 40 MG tablet Commonly known as: PROTONIX TAKE 1 TABLET (40 MG TOTAL) BY MOUTH DAILY.   Polyethyl Glycol-Propyl Glycol 0.4-0.3 % Soln Apply 1 drop to eye daily as needed (dry eyes).   pravastatin 40 MG tablet Commonly known as: PRAVACHOL Take 1 tablet (40 mg total) by mouth at bedtime.   promethazine 25 MG tablet Commonly known as: PHENERGAN Take 25 mg by mouth every 8 (eight) hours as needed for nausea or vomiting.   TART CHERRY ADVANCED PO Take 5 mLs by mouth daily. Juice Concentrate- 1tsp daily   Zovirax 5 % Generic drug: acyclovir ointment Apply 1 application topically as needed (flair).       Allergies:  Allergies  Allergen Reactions  . Epinephrine Palpitations    Raises heart rate   . Flonase [Fluticasone] Palpitations  . Protriptyline Hcl  Other (See Comments)    Severe constipation  . Tamiflu [Oseltamivir Phosphate] Other (See Comments)    "like I was having a stroke"  . Tamiflu [Oseltamivir] Anaphylaxis  . Tizanidine Other (See Comments)    panic  . Neurontin [Gabapentin] Other (See Comments)    arthralgia  . Other Hives and Rash    boiron acteane  . Qvar [Beclomethasone] Other (See Comments)  . Spiriva [Tiotropium Bromide Monohydrate] Rash  . Tiotropium Rash  . Zonegran Other (See Comments)    Mood swings  . Advair Diskus [Fluticasone-Salmeterol] Rash  . Baclofen Rash  . Chlorzoxazone Rash  . Lamotrigine Rash  . Levofloxacin Rash  . Protriptyline Rash  . Ropinirole Rash  . Verapamil Rash    Past  Medical History, Surgical history, Social history, and Family History were reviewed and updated.  Review of Systems: All other 10 point review of systems is negative.   Physical Exam:  vitals were not taken for this visit.   Wt Readings from Last 3 Encounters:  03/14/21 205 lb 1.3 oz (93 kg)  02/15/21 207 lb 1.9 oz (93.9 kg)  01/04/21 208 lb (94.3 kg)    Ocular: Sclerae unicteric, pupils equal, round and reactive to light Ear-nose-throat: Oropharynx clear, dentition fair Lymphatic: No cervical or supraclavicular adenopathy Lungs no rales or rhonchi, good excursion bilaterally Heart regular rate and rhythm, no murmur appreciated Abd soft, nontender, positive bowel sounds MSK no focal spinal tenderness, no joint edema Neuro: non-focal, well-oriented, appropriate affect Breasts: Deferred   Lab Results  Component Value Date   WBC 5.3 04/11/2021   HGB 11.2 (L) 04/11/2021   HCT 37.6 04/11/2021   MCV 75.5 (L) 04/11/2021   PLT 253 04/11/2021   Lab Results  Component Value Date   FERRITIN 4 (L) 03/14/2021   IRON 47 03/14/2021   TIBC 494 (H) 03/14/2021   UIBC 447 (H) 03/14/2021   IRONPCTSAT 10 (L) 03/14/2021   Lab Results  Component Value Date   RETICCTPCT 1.1 04/27/2015   RBC 4.98 04/11/2021    RETICCTABS 55.1 04/27/2015   No results found for: KPAFRELGTCHN, LAMBDASER, KAPLAMBRATIO No results found for: IGGSERUM, IGA, IGMSERUM No results found for: Odetta Pink, SPEI   Chemistry      Component Value Date/Time   NA 142 03/14/2021 1027   NA 147 (H) 10/08/2017 1410   NA 141 02/17/2017 1132   K 5.0 03/14/2021 1027   K 4.6 10/08/2017 1410   K 5.3 (H) 02/17/2017 1132   CL 105 03/14/2021 1027   CL 106 10/08/2017 1410   CO2 32 03/14/2021 1027   CO2 30 10/08/2017 1410   CO2 29 02/17/2017 1132   BUN 23 03/14/2021 1027   BUN 15 10/08/2017 1410   BUN 17.8 02/17/2017 1132   CREATININE 0.83 03/14/2021 1027   CREATININE 1.0 10/08/2017 1410   CREATININE 0.8 02/17/2017 1132      Component Value Date/Time   CALCIUM 9.5 03/14/2021 1027   CALCIUM 9.0 10/08/2017 1410   CALCIUM 9.3 02/17/2017 1132   ALKPHOS 60 03/14/2021 1027   ALKPHOS 59 10/08/2017 1410   ALKPHOS 79 02/17/2017 1132   AST 20 03/14/2021 1027   AST 22 02/17/2017 1132   ALT 18 03/14/2021 1027   ALT 26 10/08/2017 1410   ALT 20 02/17/2017 1132   BILITOT 0.4 03/14/2021 1027   BILITOT 0.39 02/17/2017 1132       Impression and Plan: Ms. Lair is a very pleasant 70yo caucasian female with secondary polycythemia (JAK-2 negative) due to COPD. No phlebotomy needed, Hct 37.6%.  Follow-up in 1 months.  She can contact our office with any questions or concerns.   [name], NP 5/11/202210:52 AM

## 2021-04-12 LAB — IRON AND TIBC
Iron: 31 ug/dL — ABNORMAL LOW (ref 41–142)
Saturation Ratios: 6 % — ABNORMAL LOW (ref 21–57)
TIBC: 486 ug/dL — ABNORMAL HIGH (ref 236–444)
UIBC: 455 ug/dL — ABNORMAL HIGH (ref 120–384)

## 2021-04-12 LAB — FERRITIN: Ferritin: <4 ng/mL — ABNORMAL LOW (ref 11–307)

## 2021-04-26 ENCOUNTER — Ambulatory Visit: Payer: Self-pay | Admitting: Internal Medicine

## 2021-05-11 ENCOUNTER — Inpatient Hospital Stay: Payer: Medicare HMO | Attending: Hematology & Oncology

## 2021-05-11 ENCOUNTER — Inpatient Hospital Stay: Payer: Medicare HMO

## 2021-05-11 ENCOUNTER — Other Ambulatory Visit: Payer: Self-pay

## 2021-05-11 ENCOUNTER — Inpatient Hospital Stay (HOSPITAL_BASED_OUTPATIENT_CLINIC_OR_DEPARTMENT_OTHER): Payer: Medicare HMO | Admitting: Family

## 2021-05-11 ENCOUNTER — Encounter: Payer: Self-pay | Admitting: Family

## 2021-05-11 VITALS — BP 102/64 | HR 82 | Temp 98.5°F | Resp 18 | Ht 63.0 in | Wt 207.0 lb

## 2021-05-11 DIAGNOSIS — D751 Secondary polycythemia: Secondary | ICD-10-CM | POA: Diagnosis present

## 2021-05-11 DIAGNOSIS — J449 Chronic obstructive pulmonary disease, unspecified: Secondary | ICD-10-CM | POA: Insufficient documentation

## 2021-05-11 DIAGNOSIS — D500 Iron deficiency anemia secondary to blood loss (chronic): Secondary | ICD-10-CM | POA: Diagnosis not present

## 2021-05-11 DIAGNOSIS — Z9981 Dependence on supplemental oxygen: Secondary | ICD-10-CM | POA: Insufficient documentation

## 2021-05-11 LAB — CMP (CANCER CENTER ONLY)
ALT: 19 U/L (ref 0–44)
AST: 23 U/L (ref 15–41)
Albumin: 4 g/dL (ref 3.5–5.0)
Alkaline Phosphatase: 56 U/L (ref 38–126)
Anion gap: 6 (ref 5–15)
BUN: 16 mg/dL (ref 8–23)
CO2: 33 mmol/L — ABNORMAL HIGH (ref 22–32)
Calcium: 9.7 mg/dL (ref 8.9–10.3)
Chloride: 105 mmol/L (ref 98–111)
Creatinine: 0.92 mg/dL (ref 0.44–1.00)
GFR, Estimated: >60 mL/min (ref >60)
Glucose, Bld: 70 mg/dL (ref 70–99)
Potassium: 4.8 mmol/L (ref 3.5–5.1)
Sodium: 144 mmol/L (ref 135–145)
Total Bilirubin: 0.4 mg/dL (ref 0.3–1.2)
Total Protein: 6.1 g/dL — ABNORMAL LOW (ref 6.5–8.1)

## 2021-05-11 LAB — CBC WITH DIFFERENTIAL (CANCER CENTER ONLY)
Abs Immature Granulocytes: 0.02 10*3/uL (ref 0.00–0.07)
Basophils Absolute: 0 10*3/uL (ref 0.0–0.1)
Basophils Relative: 1 %
Eosinophils Absolute: 0.1 10*3/uL (ref 0.0–0.5)
Eosinophils Relative: 2 %
HCT: 39.1 % (ref 36.0–46.0)
Hemoglobin: 11.9 g/dL — ABNORMAL LOW (ref 12.0–15.0)
Immature Granulocytes: 0 %
Lymphocytes Relative: 24 %
Lymphs Abs: 1.1 10*3/uL (ref 0.7–4.0)
MCH: 23.6 pg — ABNORMAL LOW (ref 26.0–34.0)
MCHC: 30.4 g/dL (ref 30.0–36.0)
MCV: 77.4 fL — ABNORMAL LOW (ref 80.0–100.0)
Monocytes Absolute: 0.5 10*3/uL (ref 0.1–1.0)
Monocytes Relative: 10 %
Neutro Abs: 3 10*3/uL (ref 1.7–7.7)
Neutrophils Relative %: 63 %
Platelet Count: 259 10*3/uL (ref 150–400)
RBC: 5.05 MIL/uL (ref 3.87–5.11)
RDW: 17.3 % — ABNORMAL HIGH (ref 11.5–15.5)
WBC Count: 4.7 10*3/uL (ref 4.0–10.5)
nRBC: 0 % (ref 0.0–0.2)

## 2021-05-11 NOTE — Patient Instructions (Signed)

## 2021-05-11 NOTE — Progress Notes (Signed)
Hematology and Oncology Follow Up Visit  Natalie Hendrix [id number] 1951-06-09 70 y.o. 05/11/2021   Principle Diagnosis:  Secondary polycythemia - JAK2 negative   Current Therapy:        Phlebotomy to maintain hematocrit less than 38% Plavix 75 mg by mouth daily               Interim History:  Ms. Cerney is here today with her husband for follow-up. She notes a headache and dizziness at times and feels that she needs a phlebotomy.  They feel that she had a TIA 2 months ago due to vision changes and short term memory worsening. Her husband states that her cardiologist, neurologist and PCP are aware.  She had a fall 3 weeks ago with dizziness and sustained some bruises but otherwise states she was uninjured. No syncope to report.  Her SOB is stable on 2 L [place] supplemental O2 24 hours a day.  No fever, chills, n/v, cough, rash, chest pain, palpitations, abdominal pain or changes in bowel or bladder habits.  No swelling in her extremities.  The intermittent tingling in her hands and feet is unchanged.  She is eating well and doing her best to stay hydrated. Her weight is stable at 207 lbs.   ECOG Performance Status: 1 - Symptomatic but completely ambulatory  Medications:  Allergies as of 05/11/2021       Reactions   Epinephrine Palpitations   Raises heart rate   Flonase [fluticasone] Palpitations   Protriptyline Hcl Other (See Comments)   Severe constipation   Tamiflu [oseltamivir Phosphate] Other (See Comments)   "like I was having a stroke"   Tamiflu [oseltamivir] Anaphylaxis   Tizanidine Other (See Comments)   panic   Neurontin [gabapentin] Other (See Comments)   arthralgia   Other Hives, Rash   boiron acteane   Qvar [beclomethasone] Other (See Comments)   Spiriva [tiotropium Bromide Monohydrate] Rash   Tiotropium Rash   Zonegran Other (See Comments)   Mood swings   Advair Diskus [fluticasone-salmeterol] Rash   Baclofen Rash   Chlorzoxazone Rash   Lamotrigine Rash    Levofloxacin Rash   Protriptyline Rash   Ropinirole Rash   Verapamil Rash        Medication List        Accurate as of May 11, 2021 10:54 AM. If you have any questions, ask your nurse or doctor.          albuterol 108 (90 Base) MCG/ACT inhaler Commonly known as: ProAir HFA Inhale 2 puffs into the lungs every 6 (six) hours as needed for wheezing or shortness of breath.   albuterol (2.5 MG/3ML) 0.083% nebulizer solution Commonly known as: PROVENTIL Take 3 mLs (2.5 mg total) by nebulization every 6 (six) hours as needed for wheezing or shortness of breath.   azelastine 0.1 % nasal spray Commonly known as: ASTELIN Place 1 spray into both nostrils 2 (two) times daily.   beclomethasone 42 MCG/SPRAY nasal spray Commonly known as: BECONASE-AQ 2 puffs each nostril once daily   benzonatate 100 MG capsule Commonly known as: TESSALON Take 1 capsule (100 mg total) by mouth every 6 (six) hours as needed for cough.   buPROPion 300 MG 24 hr tablet Commonly known as: WELLBUTRIN XL Take 1 tablet (300 mg total) by mouth daily with breakfast.   CALCIUM 1200 PO Take 1,250 mg by mouth daily.   Centrum Silver tablet Take 1 tablet by mouth daily.   clopidogrel 75 MG tablet Commonly known  as: PLAVIX Take 1 tablet (75 mg total) by mouth daily.   cyclobenzaprine 10 MG tablet Commonly known as: FLEXERIL Take 10 mg by mouth every 8 (eight) hours as needed.   ESTRACE VAGINAL 0.1 MG/GM vaginal cream Generic drug: estradiol Place 1 Applicatorful vaginally daily. Small amount each dose per pt   ezetimibe 10 MG tablet Commonly known as: Zetia TAKE 1 TABLET BY MOUTH DAILY.   fexofenadine 180 MG tablet Commonly known as: Allegra Allergy Take 1 tablet (180 mg total) by mouth daily.   Fish Oil 1200 MG Caps Take 1,200 mg by mouth at bedtime.   hydrOXYzine 10 MG tablet Commonly known as: ATARAX/VISTARIL Take 10 mg by mouth 3 (three) times daily as needed.   hyoscyamine 0.125 MG  Tbdp disintergrating tablet Commonly known as: ANASPAZ Place 1 tablet (0.125 mg total) under the tongue 2 (two) times daily as needed. What changed: reasons to take this   lidocaine 5 % Commonly known as: LIDODERM Place 1 patch onto the skin as needed (pain). Remove & Discard patch within 12 hours or as directed by MD   LORazepam 1 MG tablet Commonly known as: ATIVAN Take 1 tablet (1 mg total) by mouth 2 (two) times daily. May take extra 3rd dose if needed What changed:  when to take this reasons to take this   magnesium gluconate 500 MG tablet Commonly known as: MAGONATE Take 500 mg by mouth daily as needed (constipation).   meclizine 25 MG tablet Commonly known as: ANTIVERT Take 1 tablet (25 mg total) by mouth 3 (three) times daily as needed for dizziness. 3 month supply What changed: when to take this   metoprolol succinate 50 MG 24 hr tablet Commonly known as: TOPROL-XL Take 1 tablet (50 mg total) by mouth daily with breakfast. What changed: how much to take   montelukast 10 MG tablet Commonly known as: SINGULAIR TAKE ONE TABLET BY MOUTH EVERY DAY   morphine 15 MG tablet Commonly known as: MSIR Take 15 mg by mouth every 4 (four) hours as needed for severe pain.   morphine 30 MG 12 hr tablet Commonly known as: MS CONTIN Take 1 tablet by mouth every 12 (twelve) hours.   Nebulizer Devi Use as directed   OXYGEN Inhale 2.5 L/hr into the lungs continuous.   pantoprazole 40 MG tablet Commonly known as: PROTONIX TAKE 1 TABLET (40 MG TOTAL) BY MOUTH DAILY.   Polyethyl Glycol-Propyl Glycol 0.4-0.3 % Soln Apply 1 drop to eye daily as needed (dry eyes).   pravastatin 40 MG tablet Commonly known as: PRAVACHOL Take 1 tablet (40 mg total) by mouth at bedtime.   promethazine 25 MG tablet Commonly known as: PHENERGAN Take 25 mg by mouth every 8 (eight) hours as needed for nausea or vomiting.   TART CHERRY ADVANCED PO Take 5 mLs by mouth daily. Juice Concentrate-  1tsp daily   Zovirax 5 % Generic drug: acyclovir ointment Apply 1 application topically as needed (flair).        Allergies:  Allergies  Allergen Reactions   Epinephrine Palpitations    Raises heart rate    Flonase [Fluticasone] Palpitations   Protriptyline Hcl Other (See Comments)    Severe constipation   Tamiflu [Oseltamivir Phosphate] Other (See Comments)    "like I was having a stroke"   Tamiflu [Oseltamivir] Anaphylaxis   Tizanidine Other (See Comments)    panic   Neurontin [Gabapentin] Other (See Comments)    arthralgia   Other Hives and Rash  boiron acteane   Qvar [Beclomethasone] Other (See Comments)   Spiriva [Tiotropium Bromide Monohydrate] Rash   Tiotropium Rash   Zonegran Other (See Comments)    Mood swings   Advair Diskus [Fluticasone-Salmeterol] Rash   Baclofen Rash   Chlorzoxazone Rash   Lamotrigine Rash   Levofloxacin Rash   Protriptyline Rash   Ropinirole Rash   Verapamil Rash    Past Medical History, Surgical history, Social history, and Family History were reviewed and updated.  Review of Systems: All other 10 point review of systems is negative.   Physical Exam:  vitals were not taken for this visit.   Wt Readings from Last 3 Encounters:  03/14/21 205 lb 1.3 oz (93 kg)  02/15/21 207 lb 1.9 oz (93.9 kg)  01/04/21 208 lb (94.3 kg)    Ocular: Sclerae unicteric, pupils equal, round and reactive to light Ear-nose-throat: Oropharynx clear, dentition fair Lymphatic: No cervical or supraclavicular adenopathy Lungs no rales or rhonchi, good excursion bilaterally Heart regular rate and rhythm, no murmur appreciated Abd soft, nontender, positive bowel sounds MSK no focal spinal tenderness, no joint edema Neuro: non-focal, well-oriented, appropriate affect Breasts: Deferred   Lab Results  Component Value Date   WBC 4.7 05/11/2021   HGB 11.9 (L) 05/11/2021   HCT 39.1 05/11/2021   MCV 77.4 (L) 05/11/2021   PLT 259 05/11/2021   Lab  Results  Component Value Date   FERRITIN <4 (L) 04/11/2021   IRON 31 (L) 04/11/2021   TIBC 486 (H) 04/11/2021   UIBC 455 (H) 04/11/2021   IRONPCTSAT 6 (L) 04/11/2021   Lab Results  Component Value Date   RETICCTPCT 1.1 04/27/2015   RBC 5.05 05/11/2021   RETICCTABS 55.1 04/27/2015   No results found for: KPAFRELGTCHN, LAMBDASER, KAPLAMBRATIO No results found for: Kandis Cocking, IGMSERUM No results found for: Odetta Pink, SPEI   Chemistry      Component Value Date/Time   NA 144 05/11/2021 1020   NA 147 (H) 10/08/2017 1410   NA 141 02/17/2017 1132   K 4.8 05/11/2021 1020   K 4.6 10/08/2017 1410   K 5.3 (H) 02/17/2017 1132   CL 105 05/11/2021 1020   CL 106 10/08/2017 1410   CO2 33 (H) 05/11/2021 1020   CO2 30 10/08/2017 1410   CO2 29 02/17/2017 1132   BUN 16 05/11/2021 1020   BUN 15 10/08/2017 1410   BUN 17.8 02/17/2017 1132   CREATININE 0.92 05/11/2021 1020   CREATININE 1.0 10/08/2017 1410   CREATININE 0.8 02/17/2017 1132      Component Value Date/Time   CALCIUM 9.7 05/11/2021 1020   CALCIUM 9.0 10/08/2017 1410   CALCIUM 9.3 02/17/2017 1132   ALKPHOS 56 05/11/2021 1020   ALKPHOS 59 10/08/2017 1410   ALKPHOS 79 02/17/2017 1132   AST 23 05/11/2021 1020   AST 22 02/17/2017 1132   ALT 19 05/11/2021 1020   ALT 26 10/08/2017 1410   ALT 20 02/17/2017 1132   BILITOT 0.4 05/11/2021 1020   BILITOT 0.39 02/17/2017 1132       Impression and Plan: Ms. Veloso is a very pleasant 70 yo caucasian female with secondary polycythemia (JAK-2 negative) due to COPD. We will proceed with phlebotomy for Hct 39.1%. She states that she feels better when below 38%.  She received 1 liter of IV fluids as she became dizzy and had a near syncopal episode on the way out of the office and was able to sit  in a chair. Thankfully she did not fall. She is feeling much better at this time.  Follow-up in 1 month.  She can contact our office with  any questions or concerns.   [name], NP 6/10/202210:54 AM

## 2021-05-11 NOTE — Progress Notes (Signed)
Natalie Hendrix presents today for phlebotomy per MD orders. Phlebotomy procedure started at 1145 and ended at 1220. 500 grams removed from rt [facility] using 16g phlebotomy kit.  Patient observed for 30 minutes after procedure without any incident. Patient tolerated procedure well. IV needle removed intact.

## 2021-05-14 LAB — IRON AND TIBC
Iron: 34 ug/dL — ABNORMAL LOW (ref 41–142)
Saturation Ratios: 7 % — ABNORMAL LOW (ref 21–57)
TIBC: 466 ug/dL — ABNORMAL HIGH (ref 236–444)
UIBC: 432 ug/dL — ABNORMAL HIGH (ref 120–384)

## 2021-05-14 LAB — FERRITIN: Ferritin: 6 ng/mL — ABNORMAL LOW (ref 11–307)

## 2021-05-23 NOTE — Progress Notes (Deleted)
HPI female never smoker followed for asthma, history pneumonia, cough, complicated by past history for thoracic outlet syndrome, chronic hypoxic respiratory failure, complicated by CVA, GERD, allergic rhinitis, polycythemia, depression O2 2 L sleep and prn/Advanced  Nl PFT 2012 except DLCO 62% Allergy profile was NEG, 4.9 total IgE GI/ Dr Paulita Fujita: Ba swallow> stricture and probably mild aspiration Residual right diaphragm elevation after surgery for right thoracic outlet syndrome --------------------------------------------------------------------------------   10/30/20-  70 year old female never smoker followed for asthma, history pneumonia, cough, chronic hypoxic respiratory failure,  past history for thoracic outlet syndrome, chronic elevation right diaphragm,complicated by CVA, GERD, allergic rhinitis, polycythemia, depression O2 2-3 L sleep and prn/Adapt   POC = Inogen. Albuterol hfa, neb albuterol, benzonatate, allegra,  Covid vax- 3 Moderna Flu vax- today senior [place] 4 months ago> broke R ribs. Xray'ed, slept in recliner. Better now.   05/24/21- 70 year old female never smoker followed for asthma, history pneumonia, cough, chronic hypoxic respiratory failure,  past history for thoracic outlet syndrome, chronic elevation right diaphragm,complicated by CVA, GERD, allergic rhinitis, polycythemia, depression O2 2-3 L sleep and prn/Adapt   POC = Inogen. --ProAirh fa, neb albuterol, benzonatate, allegra, Beconase AQ,  Covid vax- 3 Moderna Most recent phlebotomy 6/10- Dr Marin Olp    Review of Systems- See HPI   + = positive Constitutional:   No-   weight loss, night sweats, fevers, chills, fatigue, lassitude. HEENT:   frequent  headaches,  Some difficulty swallowing,, sore throat,       No-  Sneezing,+ itching, ear ache, nasal congestion, post nasal drip,  CV:  No-   chest pain, orthopnea, PND, swelling in lower extremities, anasarca, dizziness, palpitations Resp: + shortness of breath  with exertion or at rest. productive cough              No-  coughing up of blood.              No-  change in color of mucus.   wheezing.   Skin: No-   rash or lesions. GI:  No-   heartburn, indigestion, abdominal pain, nausea, vomiting, GU:  MS:  No-   joint pain or swelling.   Neuro- + tremor and poor balance Psych:  No- change in mood or affect. No depression or anxiety.  No memory loss.    Objective:   Physical Exam General- Alert, Oriented, Affect+ tearful, Distress- none acute,  Overweight.  + Rolling walker              O2 POC 2L. Skin- rash-none, lesions- none, excoriation- none Lymphadenopathy- none Head- atraumatic            Eyes- Gross vision intact, PERRLA, conjunctivae clear secretions            Ears- Hearing, canals            Nose- Clear, No- Septal dev, mucus, polyps, erosion, perforation             Throat- Mallampati III , mucosa clear , drainage- none, tonsils- atrophic, Neck- flexible , trachea midline, no stridor , thyroid nl, carotid no bruit Chest - symmetrical excursion , unlabored           Heart/CV- RRR , no murmur , no gallop  , no rub, nl s1 s2                           - JVD- none , edema- none, stasis changes- none, varices- none  Lung- clear to P&A, wheeze-none, cough-none,                                                    dullness-none, rub- none, unlabored           Chest wall- +Vascular surgery scar Right Upper Anterior chest Abd- Br/ Gen/ Rectal- Not done, not indicated Extrem- cyanosis- none, clubbing, none, atrophy- none, strength- . +Rolling walker Neuro- + some word searching

## 2021-05-24 ENCOUNTER — Ambulatory Visit: Payer: Self-pay | Admitting: Internal Medicine

## 2021-05-31 ENCOUNTER — Ambulatory Visit: Payer: Self-pay | Admitting: Internal Medicine

## 2021-06-12 ENCOUNTER — Inpatient Hospital Stay (HOSPITAL_BASED_OUTPATIENT_CLINIC_OR_DEPARTMENT_OTHER): Payer: Medicare HMO | Admitting: Family

## 2021-06-12 ENCOUNTER — Other Ambulatory Visit: Payer: Self-pay

## 2021-06-12 ENCOUNTER — Inpatient Hospital Stay: Payer: Medicare HMO

## 2021-06-12 ENCOUNTER — Encounter: Payer: Self-pay | Admitting: Family

## 2021-06-12 ENCOUNTER — Inpatient Hospital Stay: Payer: Medicare HMO | Attending: Hematology & Oncology

## 2021-06-12 VITALS — BP 112/58 | HR 72

## 2021-06-12 VITALS — BP 118/63 | HR 77 | Temp 98.7°F | Resp 19 | Wt 201.0 lb

## 2021-06-12 DIAGNOSIS — D500 Iron deficiency anemia secondary to blood loss (chronic): Secondary | ICD-10-CM

## 2021-06-12 DIAGNOSIS — D751 Secondary polycythemia: Secondary | ICD-10-CM

## 2021-06-12 DIAGNOSIS — D45 Polycythemia vera: Secondary | ICD-10-CM

## 2021-06-12 LAB — CMP (CANCER CENTER ONLY)
ALT: 16 U/L (ref 0–44)
AST: 20 U/L (ref 15–41)
Albumin: 4.1 g/dL (ref 3.5–5.0)
Alkaline Phosphatase: 65 U/L (ref 38–126)
Anion gap: 7 (ref 5–15)
BUN: 15 mg/dL (ref 8–23)
CO2: 28 mmol/L (ref 22–32)
Calcium: 9.4 mg/dL (ref 8.9–10.3)
Chloride: 102 mmol/L (ref 98–111)
Creatinine: 0.82 mg/dL (ref 0.44–1.00)
GFR, Estimated: >60 mL/min (ref >60)
Glucose, Bld: 95 mg/dL (ref 70–99)
Potassium: 4.5 mmol/L (ref 3.5–5.1)
Sodium: 137 mmol/L (ref 135–145)
Total Bilirubin: 0.4 mg/dL (ref 0.3–1.2)
Total Protein: 6.3 g/dL — ABNORMAL LOW (ref 6.5–8.1)

## 2021-06-12 LAB — CBC WITH DIFFERENTIAL (CANCER CENTER ONLY)
Abs Immature Granulocytes: 0.01 10*3/uL (ref 0.00–0.07)
Basophils Absolute: 0.1 10*3/uL (ref 0.0–0.1)
Basophils Relative: 1 %
Eosinophils Absolute: 0.2 10*3/uL (ref 0.0–0.5)
Eosinophils Relative: 3 %
HCT: 38.2 % (ref 36.0–46.0)
Hemoglobin: 11.5 g/dL — ABNORMAL LOW (ref 12.0–15.0)
Immature Granulocytes: 0 %
Lymphocytes Relative: 23 %
Lymphs Abs: 1.5 10*3/uL (ref 0.7–4.0)
MCH: 23 pg — ABNORMAL LOW (ref 26.0–34.0)
MCHC: 30.1 g/dL (ref 30.0–36.0)
MCV: 76.2 fL — ABNORMAL LOW (ref 80.0–100.0)
Monocytes Absolute: 0.6 10*3/uL (ref 0.1–1.0)
Monocytes Relative: 10 %
Neutro Abs: 4.3 10*3/uL (ref 1.7–7.7)
Neutrophils Relative %: 63 %
Platelet Count: 283 10*3/uL (ref 150–400)
RBC: 5.01 MIL/uL (ref 3.87–5.11)
RDW: 15.9 % — ABNORMAL HIGH (ref 11.5–15.5)
WBC Count: 6.7 10*3/uL (ref 4.0–10.5)
nRBC: 0 % (ref 0.0–0.2)

## 2021-06-12 MED ORDER — SODIUM CHLORIDE 0.9 % IV SOLN
Freq: Once | INTRAVENOUS | Status: AC
  Filled 2021-06-12: qty 250

## 2021-06-12 MED ORDER — DENOSUMAB 120 MG/1.7ML ~~LOC~~ SOLN
SUBCUTANEOUS | Status: AC
  Filled 2021-06-12: qty 1.7

## 2021-06-12 MED ORDER — SODIUM CHLORIDE 0.9 % IV SOLN
Freq: Once | INTRAVENOUS | Status: DC
  Filled 2021-06-12: qty 250

## 2021-06-12 NOTE — Progress Notes (Signed)
Hematology and Oncology Follow Up Visit  Natalie Hendrix [id number] 11-15-1951 70 y.o. 06/12/2021   Principle Diagnosis:  Secondary polycythemia - JAK2 negative   Current Therapy:        Phlebotomy to maintain hematocrit less than 38% Plavix 75 mg by mouth daily   Interim History:  [name] is here today with her husband for follow-up. She is feeling fatigued and has noted increased itching all over. No rash visible.  She has SOB with over exertion and will take a break to rest as needed. She is doing well on supplemental O2 24 hours a day.  Since what they feel was a TIA a couple months ago she has had some issues with short term memory recall. This seems to bee a little improved.  No blood loss noted. No abnormal bruising or petechiae.  No fever, chills, n/v, cough, rash, dizziness, SOB, chest pain, palpitations, abdominal pain or changes in bowel or bladder habits.  No swelling, tenderness, numbness or tingling in her extremities.  No new falls or syncope to report. She does have issues with balance and uses a Rolator when ambulating for added support.  She states that she she enjoys cold fruits and vegetables in the suppertime and is doing her best to stay well hydrated. Her weight is stable at 201 lbs.   ECOG Performance Status: 1 - Symptomatic but completely ambulatory  Medications:  Allergies as of 06/12/2021       Reactions   Epinephrine Palpitations   Raises heart rate   Flonase [fluticasone] Palpitations   Protriptyline Hcl Other (See Comments)   Severe constipation   Tamiflu [oseltamivir Phosphate] Other (See Comments)   "like I was having a stroke"   Tamiflu [oseltamivir] Anaphylaxis   Tizanidine Other (See Comments)   panic   Gabapentin Other (See Comments)   arthralgia Other reaction(s): Arthralgia (Joint Pain)   Other Hives, Rash   boiron acteane   Qvar [beclomethasone] Other (See Comments)   Spiriva [tiotropium Bromide Monohydrate] Rash   Tiotropium Rash    Zonegran Other (See Comments)   Mood swings   Advair Diskus [fluticasone-salmeterol] Rash   Baclofen Rash   Chlorzoxazone Rash   Lamotrigine Rash   Levofloxacin Rash   Protriptyline Rash   Ropinirole Rash   Tiotropium Bromide Monohydrate Rash   Verapamil Rash        Medication List        Accurate as of June 12, 2021  1:16 PM. If you have any questions, ask your nurse or doctor.          albuterol 108 (90 Base) MCG/ACT inhaler Commonly known as: ProAir HFA Inhale 2 puffs into the lungs every 6 (six) hours as needed for wheezing or shortness of breath.   albuterol (2.5 MG/3ML) 0.083% nebulizer solution Commonly known as: PROVENTIL Take 3 mLs (2.5 mg total) by nebulization every 6 (six) hours as needed for wheezing or shortness of breath.   azelastine 0.1 % nasal spray Commonly known as: ASTELIN Place 1 spray into both nostrils 2 (two) times daily.   beclomethasone 42 MCG/SPRAY nasal spray Commonly known as: BECONASE-AQ 2 puffs each nostril once daily   benzonatate 100 MG capsule Commonly known as: TESSALON Take 1 capsule (100 mg total) by mouth every 6 (six) hours as needed for cough.   buPROPion 300 MG 24 hr tablet Commonly known as: WELLBUTRIN XL Take 1 tablet (300 mg total) by mouth daily with breakfast.   CALCIUM 1200 PO Take 1,250  mg by mouth daily.   Centrum Silver tablet Take 1 tablet by mouth daily.   clopidogrel 75 MG tablet Commonly known as: PLAVIX Take 1 tablet (75 mg total) by mouth daily.   cyclobenzaprine 10 MG tablet Commonly known as: FLEXERIL Take 10 mg by mouth every 8 (eight) hours as needed.   ESTRACE VAGINAL 0.1 MG/GM vaginal cream Generic drug: estradiol Place 1 Applicatorful vaginally daily. Small amount each dose per pt   ezetimibe 10 MG tablet Commonly known as: Zetia TAKE 1 TABLET BY MOUTH DAILY.   fexofenadine 180 MG tablet Commonly known as: Allegra Allergy Take 1 tablet (180 mg total) by mouth daily.   Fish Oil  1200 MG Caps Take 1,200 mg by mouth at bedtime.   hydrOXYzine 10 MG tablet Commonly known as: ATARAX/VISTARIL Take 10 mg by mouth 3 (three) times daily as needed.   hyoscyamine 0.125 MG Tbdp disintergrating tablet Commonly known as: ANASPAZ Place 1 tablet (0.125 mg total) under the tongue 2 (two) times daily as needed. What changed: reasons to take this   lidocaine 5 % Commonly known as: LIDODERM Place 1 patch onto the skin as needed (pain). Remove & Discard patch within 12 hours or as directed by MD   LORazepam 1 MG tablet Commonly known as: ATIVAN Take 1 tablet (1 mg total) by mouth 2 (two) times daily. May take extra 3rd dose if needed What changed:  when to take this reasons to take this   magnesium gluconate 500 MG tablet Commonly known as: MAGONATE Take 500 mg by mouth daily as needed (constipation).   meclizine 25 MG tablet Commonly known as: ANTIVERT Take 1 tablet (25 mg total) by mouth 3 (three) times daily as needed for dizziness. 3 month supply What changed: when to take this   metoprolol succinate 50 MG 24 hr tablet Commonly known as: TOPROL-XL Take 1 tablet (50 mg total) by mouth daily with breakfast. What changed: how much to take   montelukast 10 MG tablet Commonly known as: SINGULAIR TAKE ONE TABLET BY MOUTH EVERY DAY   morphine 15 MG tablet Commonly known as: MSIR Take 15 mg by mouth every 4 (four) hours as needed for severe pain.   morphine 30 MG 12 hr tablet Commonly known as: MS CONTIN Take 1 tablet by mouth every 12 (twelve) hours.   Nebulizer Devi Use as directed   OXYGEN Inhale 2.5 L/hr into the lungs continuous.   pantoprazole 40 MG tablet Commonly known as: PROTONIX TAKE 1 TABLET (40 MG TOTAL) BY MOUTH DAILY.   Polyethyl Glycol-Propyl Glycol 0.4-0.3 % Soln Apply 1 drop to eye daily as needed (dry eyes).   pravastatin 40 MG tablet Commonly known as: PRAVACHOL Take 1 tablet (40 mg total) by mouth at bedtime.   promethazine 25 MG  tablet Commonly known as: PHENERGAN Take 25 mg by mouth every 8 (eight) hours as needed for nausea or vomiting.   TART CHERRY ADVANCED PO Take 5 mLs by mouth daily. Juice Concentrate- 1tsp daily   Zovirax 5 % Generic drug: acyclovir ointment Apply 1 application topically as needed (flair).        Allergies:  Allergies  Allergen Reactions   Epinephrine Palpitations    Raises heart rate    Flonase [Fluticasone] Palpitations   Protriptyline Hcl Other (See Comments)    Severe constipation   Tamiflu [Oseltamivir Phosphate] Other (See Comments)    "like I was having a stroke"   Tamiflu [Oseltamivir] Anaphylaxis   Tizanidine Other (See  Comments)    panic   Gabapentin Other (See Comments)    arthralgia Other reaction(s): Arthralgia (Joint Pain)   Other Hives and Rash    boiron acteane   Qvar [Beclomethasone] Other (See Comments)   Spiriva [Tiotropium Bromide Monohydrate] Rash   Tiotropium Rash   Zonegran Other (See Comments)    Mood swings   Advair Diskus [Fluticasone-Salmeterol] Rash   Baclofen Rash   Chlorzoxazone Rash   Lamotrigine Rash   Levofloxacin Rash   Protriptyline Rash   Ropinirole Rash   Tiotropium Bromide Monohydrate Rash   Verapamil Rash    Past Medical History, Surgical history, Social history, and Family History were reviewed and updated.  Review of Systems: All other 10 point review of systems is negative.   Physical Exam:  vitals were not taken for this visit.   Wt Readings from Last 3 Encounters:  05/11/21 207 lb (93.9 kg)  03/14/21 205 lb 1.3 oz (93 kg)  02/15/21 207 lb 1.9 oz (93.9 kg)    Ocular: Sclerae unicteric, pupils equal, round and reactive to light Ear-nose-throat: Oropharynx clear, dentition fair Lymphatic: No cervical or supraclavicular adenopathy Lungs no rales or rhonchi, good excursion bilaterally Heart regular rate and rhythm, no murmur appreciated Abd soft, nontender, positive bowel sounds MSK no focal spinal  tenderness, no joint edema Neuro: non-focal, well-oriented, appropriate affect Breasts: Deferred   Lab Results  Component Value Date   WBC 6.7 06/12/2021   HGB 11.5 (L) 06/12/2021   HCT 38.2 06/12/2021   MCV 76.2 (L) 06/12/2021   PLT 283 06/12/2021   Lab Results  Component Value Date   FERRITIN 6 (L) 05/11/2021   IRON 34 (L) 05/11/2021   TIBC 466 (H) 05/11/2021   UIBC 432 (H) 05/11/2021   IRONPCTSAT 7 (L) 05/11/2021   Lab Results  Component Value Date   RETICCTPCT 1.1 04/27/2015   RBC 5.01 06/12/2021   RETICCTABS 55.1 04/27/2015   No results found for: KPAFRELGTCHN, LAMBDASER, KAPLAMBRATIO No results found for: Kandis Cocking, IGMSERUM No results found for: Odetta Pink, SPEI   Chemistry      Component Value Date/Time   NA 144 05/11/2021 1020   NA 147 (H) 10/08/2017 1410   NA 141 02/17/2017 1132   K 4.8 05/11/2021 1020   K 4.6 10/08/2017 1410   K 5.3 (H) 02/17/2017 1132   CL 105 05/11/2021 1020   CL 106 10/08/2017 1410   CO2 33 (H) 05/11/2021 1020   CO2 30 10/08/2017 1410   CO2 29 02/17/2017 1132   BUN 16 05/11/2021 1020   BUN 15 10/08/2017 1410   BUN 17.8 02/17/2017 1132   CREATININE 0.92 05/11/2021 1020   CREATININE 1.0 10/08/2017 1410   CREATININE 0.8 02/17/2017 1132      Component Value Date/Time   CALCIUM 9.7 05/11/2021 1020   CALCIUM 9.0 10/08/2017 1410   CALCIUM 9.3 02/17/2017 1132   ALKPHOS 56 05/11/2021 1020   ALKPHOS 59 10/08/2017 1410   ALKPHOS 79 02/17/2017 1132   AST 23 05/11/2021 1020   AST 22 02/17/2017 1132   ALT 19 05/11/2021 1020   ALT 26 10/08/2017 1410   ALT 20 02/17/2017 1132   BILITOT 0.4 05/11/2021 1020   BILITOT 0.39 02/17/2017 1132       Impression and Plan: Ms. Roa is a very pleasant 71 yo caucasian female with secondary polycythemia (JAK-2 negative) due to COPD. We will proceed with partial phlebotomy today for Hct 38.2% and remove  250 ml. She will then get 250 ml  replacement fluids after.  Follow-up in 1 month.  She can contact our office with any questions or concerns.   [name], NP 7/12/20221:16 PM

## 2021-06-12 NOTE — Progress Notes (Signed)
1/2 unit phlebotomy performed per Scherrie Bateman, NP using a 20 g IV angiocath to the right AC over 5 minutes. Patient tolerated well. Nourishment and IV replacement fluids provided as ordered. Patient blood pressure checked before anticipated discharge. Blood pressure 81/59. Scherrie Bateman, NP notified. Order given and carried out for an additional 250 mL normal saline.

## 2021-06-12 NOTE — Patient Instructions (Signed)
Therapeutic Phlebotomy Therapeutic phlebotomy is the planned removal of blood from a person's body for the purpose of treating a medical condition. The procedure is similar to donating blood. Usually, about a pint (470 mL, or 0.47 L) of blood is removed.The average adult has 9-12 pints (4.3-5.7 L) of blood in the body. Therapeutic phlebotomy may be used to treat the following medical conditions: Hemochromatosis. This is a condition in which the blood contains too much iron. Polycythemia vera. This is a condition in which the blood contains too many red blood cells. Porphyria cutanea tarda. This is a disease in which an important part of hemoglobin is not made properly. It results in the buildup of abnormal amounts of porphyrins in the body. Sickle cell disease. This is a condition in which the red blood cells form an abnormal crescent shape rather than a round shape. Tell a health care provider about: Any allergies you have. All medicines you are taking, including vitamins, herbs, eye drops, creams, and over-the-counter medicines. Any problems you or family members have had with anesthetic medicines. Any blood disorders you have. Any surgeries you have had. Any medical conditions you have. Whether you are pregnant or may be pregnant. What are the risks? Generally, this is a safe procedure. However, problems may occur, including: Nausea or light-headedness. Low blood pressure (hypotension). Soreness, bleeding, swelling, or bruising at the needle insertion site. Infection. What happens before the procedure? Follow instructions from your health care provider about eating or drinking restrictions. Ask your health care provider about: Changing or stopping your regular medicines. This is especially important if you are taking diabetes medicines or blood thinners (anticoagulants). Taking medicines such as aspirin and ibuprofen. These medicines can thin your blood. Do not take these medicines unless  your health care provider tells you to take them. Taking over-the-counter medicines, vitamins, herbs, and supplements. Wear clothing with sleeves that can be raised above the elbow. Plan to have someone take you home from the hospital or clinic. You may have a blood sample taken. Your blood pressure, pulse rate, and breathing rate will be measured. What happens during the procedure?  To lower your risk of infection: Your health care team will wash or sanitize their hands. Your skin will be cleaned with an antiseptic. You may be given a medicine to numb the area (local anesthetic). A tourniquet will be placed on your arm. A needle will be inserted into one of your veins. Tubing and a collection bag will be attached to that needle. Blood will flow through the needle and tubing into the collection bag. The collection bag will be placed lower than your arm to allow gravity to help the flow of blood into the bag. You may be asked to open and close your hand slowly and continually during the entire collection. After the specified amount of blood has been removed from your body, the collection bag and tubing will be clamped. The needle will be removed from your vein. Pressure will be held on the site of the needle insertion to stop the bleeding. A bandage (dressing) will be placed over the needle insertion site. The procedure may vary among health care providers and hospitals. What happens after the procedure? Your blood pressure, pulse rate, and breathing rate will be measured after the procedure. You will be encouraged to drink fluids. Your recovery will be assessed and monitored. You can return to your normal activities as told by your health care provider. Summary Therapeutic phlebotomy is the planned removal of   blood from a person's body for the purpose of treating a medical condition. Therapeutic phlebotomy may be used to treat hemochromatosis, polycythemia vera, porphyria cutanea tarda,  or sickle cell disease. In the procedure, a needle is inserted and about a pint (470 mL, or 0.47 L) of blood is removed. The average adult has 9-12 pints (4.3-5.7 L) of blood in the body. This is generally a safe procedure, but it can sometimes cause problems such as nausea, light-headedness, or low blood pressure (hypotension). This information is not intended to replace advice given to you by your health care provider. Make sure you discuss any questions you have with your healthcare provider. Document Revised: 12/04/2017 Document Reviewed: 12/04/2017 Elsevier Patient Education  2022 Elsevier Inc.  

## 2021-06-12 NOTE — Progress Notes (Signed)
Patient BP 112/58, states she feels a lot better and no dizziness. Ok per NP to discharge.

## 2021-06-13 LAB — IRON AND TIBC
Iron: 36 ug/dL — ABNORMAL LOW (ref 41–142)
Saturation Ratios: 8 % — ABNORMAL LOW (ref 21–57)
TIBC: 473 ug/dL — ABNORMAL HIGH (ref 236–444)
UIBC: 436 ug/dL — ABNORMAL HIGH (ref 120–384)

## 2021-06-13 LAB — FERRITIN: Ferritin: <4 ng/mL — ABNORMAL LOW (ref 11–307)

## 2021-07-10 ENCOUNTER — Inpatient Hospital Stay: Payer: Medicare HMO

## 2021-07-10 ENCOUNTER — Inpatient Hospital Stay: Payer: Medicare HMO | Admitting: Family

## 2021-07-12 ENCOUNTER — Ambulatory Visit: Payer: Self-pay | Admitting: Internal Medicine

## 2021-07-13 ENCOUNTER — Inpatient Hospital Stay: Payer: Medicare HMO

## 2021-07-13 ENCOUNTER — Inpatient Hospital Stay: Payer: Medicare HMO | Attending: Hematology & Oncology

## 2021-07-13 ENCOUNTER — Other Ambulatory Visit: Payer: Self-pay

## 2021-07-13 DIAGNOSIS — D751 Secondary polycythemia: Secondary | ICD-10-CM | POA: Insufficient documentation

## 2021-07-13 DIAGNOSIS — D500 Iron deficiency anemia secondary to blood loss (chronic): Secondary | ICD-10-CM | POA: Diagnosis not present

## 2021-07-13 LAB — CBC WITH DIFFERENTIAL (CANCER CENTER ONLY)
Abs Immature Granulocytes: 0.01 10*3/uL (ref 0.00–0.07)
Basophils Absolute: 0.1 10*3/uL (ref 0.0–0.1)
Basophils Relative: 1 %
Eosinophils Absolute: 0.2 10*3/uL (ref 0.0–0.5)
Eosinophils Relative: 4 %
HCT: 37.8 % (ref 36.0–46.0)
Hemoglobin: 11.4 g/dL — ABNORMAL LOW (ref 12.0–15.0)
Immature Granulocytes: 0 %
Lymphocytes Relative: 30 %
Lymphs Abs: 2 10*3/uL (ref 0.7–4.0)
MCH: 23.1 pg — ABNORMAL LOW (ref 26.0–34.0)
MCHC: 30.2 g/dL (ref 30.0–36.0)
MCV: 76.7 fL — ABNORMAL LOW (ref 80.0–100.0)
Monocytes Absolute: 0.7 10*3/uL (ref 0.1–1.0)
Monocytes Relative: 11 %
Neutro Abs: 3.6 10*3/uL (ref 1.7–7.7)
Neutrophils Relative %: 54 %
Platelet Count: 246 10*3/uL (ref 150–400)
RBC: 4.93 MIL/uL (ref 3.87–5.11)
RDW: 16.4 % — ABNORMAL HIGH (ref 11.5–15.5)
WBC Count: 6.6 10*3/uL (ref 4.0–10.5)
nRBC: 0 % (ref 0.0–0.2)

## 2021-07-13 LAB — CMP (CANCER CENTER ONLY)
ALT: 17 U/L (ref 0–44)
AST: 23 U/L (ref 15–41)
Albumin: 3.9 g/dL (ref 3.5–5.0)
Alkaline Phosphatase: 59 U/L (ref 38–126)
Anion gap: 6 (ref 5–15)
BUN: 15 mg/dL (ref 8–23)
CO2: 31 mmol/L (ref 22–32)
Calcium: 9.5 mg/dL (ref 8.9–10.3)
Chloride: 104 mmol/L (ref 98–111)
Creatinine: 0.87 mg/dL (ref 0.44–1.00)
GFR, Estimated: >60 mL/min (ref >60)
Glucose, Bld: 87 mg/dL (ref 70–99)
Potassium: 4.8 mmol/L (ref 3.5–5.1)
Sodium: 141 mmol/L (ref 135–145)
Total Bilirubin: 0.4 mg/dL (ref 0.3–1.2)
Total Protein: 6 g/dL — ABNORMAL LOW (ref 6.5–8.1)

## 2021-07-16 LAB — IRON AND TIBC
Iron: 37 ug/dL — ABNORMAL LOW (ref 41–142)
Saturation Ratios: 7 % — ABNORMAL LOW (ref 21–57)
TIBC: 496 ug/dL — ABNORMAL HIGH (ref 236–444)
UIBC: 460 ug/dL — ABNORMAL HIGH (ref 120–384)

## 2021-07-16 LAB — FERRITIN: Ferritin: 4 ng/mL — ABNORMAL LOW (ref 11–307)

## 2021-07-24 ENCOUNTER — Other Ambulatory Visit: Payer: Medicare HMO

## 2021-07-24 ENCOUNTER — Ambulatory Visit: Payer: Medicare HMO | Admitting: Family

## 2021-07-24 ENCOUNTER — Ambulatory Visit: Payer: Medicare HMO

## 2021-07-31 ENCOUNTER — Ambulatory Visit: Payer: Medicare HMO | Admitting: Family

## 2021-07-31 ENCOUNTER — Other Ambulatory Visit: Payer: Medicare HMO

## 2021-08-01 NOTE — Progress Notes (Signed)
HPI female never smoker followed for asthma, history pneumonia, cough, complicated by past history for thoracic outlet syndrome, chronic hypoxic respiratory failure, complicated by CVA, GERD, allergic rhinitis, polycythemia, depression O2 2 L sleep and prn/Advanced  Nl PFT 2012 except DLCO 62% Allergy profile was NEG, 4.9 total IgE GI/ Dr Paulita Fujita: Ba swallow> stricture and probably mild aspiration Residual right diaphragm elevation after surgery for right thoracic outlet syndrome --------------------------------------------------------------------------------   10/30/20-  70 year old female never smoker followed for asthma, history pneumonia, cough, chronic hypoxic respiratory failure,  past history for thoracic outlet syndrome, chronic elevation right diaphragm,complicated by CVA, GERD, allergic rhinitis, polycythemia, depression O2 2-3 L sleep and prn/Adapt   POC = Inogen. Albuterol hfa, neb albuterol, benzonatate, allegra,  Covid vax- 3 Moderna Flu vax- today senior [place] 4 months ago> broke R ribs. Xray'ed, slept in recliner. Better now.   08/02/21- 70 year old female never smoker followed for asthma, history pneumonia, cough, chronic hypoxic respiratory failure,  past history for thoracic outlet syndrome, chronic elevation right diaphragm,complicated by CVA, GERD, allergic rhinitis, Polycythemia/ Phlebotomy, depression O2 2-3 L sleep and prn/Adapt   POC = Inogen. -Albuterol hfa, neb albuterol, benzonatate, allegra,  Covid vax- 3 Moderna -----No complaint's Continues O2 for sleep and still needs occ phlebotomy for polycythemia. Denies acute respiratory changes.   Review of Systems- See HPI   + = positive Constitutional:   No-   weight loss, night sweats, fevers, chills, fatigue, lassitude. HEENT:   frequent  headaches,  Some difficulty swallowing,, sore throat,       No-  Sneezing,+ itching, ear ache, nasal congestion, post nasal drip,  CV:  No-   chest pain, orthopnea, PND, swelling in  lower extremities, anasarca, dizziness, palpitations Resp: + shortness of breath with exertion or at rest. productive cough              No-  coughing up of blood.              No-  change in color of mucus.   wheezing.   Skin: No-   rash or lesions. GI:  No-   heartburn, indigestion, abdominal pain, nausea, vomiting, GU:  MS:  No-   joint pain or swelling.   Neuro- + tremor and poor balance Psych:  No- change in mood or affect. No depression or anxiety.  No memory loss.    Objective:   Physical Exam General- Alert, Oriented, Affect+ tearful, Distress- none acute,  Overweight.  + Rolling walker              O2 POC 2L. Skin- rash-none, lesions- none, excoriation- none Lymphadenopathy- none Head- atraumatic            Eyes- Gross vision intact, PERRLA, conjunctivae clear secretions            Ears- Hearing, canals            Nose- Clear, No- Septal dev, mucus, polyps, erosion, perforation             Throat- Mallampati III , mucosa clear , drainage- none, tonsils- atrophic, Neck- flexible , trachea midline, no stridor , thyroid nl, carotid no bruit Chest - symmetrical excursion , unlabored           Heart/CV- RRR , no murmur , no gallop  , no rub, nl s1 s2                           - JVD-  none , edema- none, stasis changes- none, varices- none           Lung- clear to P&A, wheeze-none, cough-none,                                                    dullness-none, rub- none, unlabored           Chest wall- +Vascular surgery scar Right Upper Anterior chest Abd- Br/ Gen/ Rectal- Not done, not indicated Extrem- cyanosis- none, clubbing, none, atrophy- none, strength- . +Rolling walker Neuro- + some word searching

## 2021-08-02 ENCOUNTER — Encounter: Payer: Self-pay | Admitting: Internal Medicine

## 2021-08-02 ENCOUNTER — Other Ambulatory Visit: Payer: Self-pay

## 2021-08-02 ENCOUNTER — Ambulatory Visit (INDEPENDENT_AMBULATORY_CARE_PROVIDER_SITE_OTHER): Payer: Medicare HMO | Admitting: Internal Medicine

## 2021-08-02 VITALS — BP 120/74 | HR 84 | Temp 98.2°F | Ht 67.0 in | Wt 194.8 lb

## 2021-08-02 DIAGNOSIS — J9611 Chronic respiratory failure with hypoxia: Secondary | ICD-10-CM

## 2021-08-02 DIAGNOSIS — D45 Polycythemia vera: Secondary | ICD-10-CM | POA: Diagnosis not present

## 2021-08-02 NOTE — Patient Instructions (Signed)
Order- [facility]     Please replace old POC 2L pulse,   dx chronic respiratory failure with hypoxia  Ok to continue current meds.  You can try off Singulair if you want  Please call if we can help

## 2021-08-07 ENCOUNTER — Encounter: Payer: Self-pay | Admitting: Internal Medicine

## 2021-08-07 NOTE — Assessment & Plan Note (Signed)
Phlebotomy managed by hematology

## 2021-08-07 NOTE — Assessment & Plan Note (Signed)
Continues to need O2 during sleep to help suppress polycythemia

## 2021-08-10 ENCOUNTER — Other Ambulatory Visit: Payer: Self-pay | Admitting: Family

## 2021-08-10 DIAGNOSIS — D500 Iron deficiency anemia secondary to blood loss (chronic): Secondary | ICD-10-CM

## 2021-08-10 DIAGNOSIS — D751 Secondary polycythemia: Secondary | ICD-10-CM

## 2021-08-13 ENCOUNTER — Inpatient Hospital Stay: Payer: Medicare HMO

## 2021-08-13 ENCOUNTER — Inpatient Hospital Stay (HOSPITAL_BASED_OUTPATIENT_CLINIC_OR_DEPARTMENT_OTHER): Payer: Medicare HMO | Admitting: Family

## 2021-08-13 ENCOUNTER — Inpatient Hospital Stay: Payer: Medicare HMO | Attending: Hematology & Oncology

## 2021-08-13 ENCOUNTER — Other Ambulatory Visit: Payer: Self-pay

## 2021-08-13 ENCOUNTER — Encounter: Payer: Self-pay | Admitting: Family

## 2021-08-13 VITALS — BP 106/58 | HR 74

## 2021-08-13 VITALS — BP 110/63 | HR 76 | Temp 98.6°F | Resp 20 | Ht 67.0 in | Wt 194.1 lb

## 2021-08-13 DIAGNOSIS — Z9981 Dependence on supplemental oxygen: Secondary | ICD-10-CM | POA: Diagnosis not present

## 2021-08-13 DIAGNOSIS — D500 Iron deficiency anemia secondary to blood loss (chronic): Secondary | ICD-10-CM

## 2021-08-13 DIAGNOSIS — D751 Secondary polycythemia: Secondary | ICD-10-CM

## 2021-08-13 DIAGNOSIS — R0602 Shortness of breath: Secondary | ICD-10-CM | POA: Insufficient documentation

## 2021-08-13 DIAGNOSIS — D45 Polycythemia vera: Secondary | ICD-10-CM

## 2021-08-13 LAB — CBC WITH DIFFERENTIAL (CANCER CENTER ONLY)
Abs Immature Granulocytes: 0.02 10*3/uL (ref 0.00–0.07)
Basophils Absolute: 0 10*3/uL (ref 0.0–0.1)
Basophils Relative: 1 %
Eosinophils Absolute: 0.1 10*3/uL (ref 0.0–0.5)
Eosinophils Relative: 2 %
HCT: 39.4 % (ref 36.0–46.0)
Hemoglobin: 12.2 g/dL (ref 12.0–15.0)
Immature Granulocytes: 0 %
Lymphocytes Relative: 23 %
Lymphs Abs: 1.4 10*3/uL (ref 0.7–4.0)
MCH: 23.4 pg — ABNORMAL LOW (ref 26.0–34.0)
MCHC: 31 g/dL (ref 30.0–36.0)
MCV: 75.6 fL — ABNORMAL LOW (ref 80.0–100.0)
Monocytes Absolute: 0.5 10*3/uL (ref 0.1–1.0)
Monocytes Relative: 8 %
Neutro Abs: 4 10*3/uL (ref 1.7–7.7)
Neutrophils Relative %: 66 %
Platelet Count: 230 10*3/uL (ref 150–400)
RBC: 5.21 MIL/uL — ABNORMAL HIGH (ref 3.87–5.11)
RDW: 17.2 % — ABNORMAL HIGH (ref 11.5–15.5)
WBC Count: 6 10*3/uL (ref 4.0–10.5)
nRBC: 0 % (ref 0.0–0.2)

## 2021-08-13 LAB — CMP (CANCER CENTER ONLY)
ALT: 15 U/L (ref 0–44)
AST: 21 U/L (ref 15–41)
Albumin: 4 g/dL (ref 3.5–5.0)
Alkaline Phosphatase: 55 U/L (ref 38–126)
Anion gap: 7 (ref 5–15)
BUN: 14 mg/dL (ref 8–23)
CO2: 27 mmol/L (ref 22–32)
Calcium: 9.3 mg/dL (ref 8.9–10.3)
Chloride: 107 mmol/L (ref 98–111)
Creatinine: 0.82 mg/dL (ref 0.44–1.00)
GFR, Estimated: >60 mL/min (ref >60)
Glucose, Bld: 94 mg/dL (ref 70–99)
Potassium: 4.7 mmol/L (ref 3.5–5.1)
Sodium: 141 mmol/L (ref 135–145)
Total Bilirubin: 0.5 mg/dL (ref 0.3–1.2)
Total Protein: 6.3 g/dL — ABNORMAL LOW (ref 6.5–8.1)

## 2021-08-13 MED ORDER — SODIUM CHLORIDE 0.9 % IV SOLN: Freq: Once | INTRAVENOUS | Status: AC

## 2021-08-13 NOTE — Progress Notes (Signed)
Natalie Hendrix presents today for phlebotomy per MD orders. Phlebotomy procedure started at 1510 and ended at 1530 via 20 gauge catheter to right ac.  550 grams removed without difficulty.  750 ml of NS administered after phlebotomy per order of S. [place] NP.  Patient tolerated procedure well. IV needle removed intact.

## 2021-08-13 NOTE — Progress Notes (Signed)
Hematology and Oncology Follow Up Visit  Natalie Hendrix [id number] 09/01/1951 70 y.o. 08/13/2021   Principle Diagnosis:  Secondary polycythemia - JAK2 negative   Current Therapy:        Phlebotomy to maintain hematocrit less than 38% Plavix 75 mg by mouth daily   Interim History:  [name] is here today for follow-up. She is symptomatic with fatigue and occasional muscle cramps in her legs at night. Her SOB seems to be well controlled on supplemental O2. She has had an issue with her portable machine not working properly and is working with her insurance to get a replacement. This has been difficult.  She has some small dry spots on her arms and plans to go see a dermatologist. She has been covering up when outside and avoiding the sun.  No fever, chills, n/v, cough, chest pain, palpitations, abdominal pain or changes in bowel or bladder habits at this time. She very occasionally has a twinge of pain in the right upper quadrant of the abdomen.  No swelling in her extremities. Pedal pulses are 2+.  Neuropathy in her hands and feet [place] unchanged from baseline.  No falls or syncope to report. She ambulates with her walking still or a Rolator.  She does not have much of an appetite but is still eating regularly throughout the day. She feels that she is staying well hydrated. Her weight is 194 lbs.   ECOG Performance Status: 1 - Symptomatic but completely ambulatory  Medications:  Allergies as of 08/13/2021       Reactions   Epinephrine Palpitations   Raises heart rate   Flonase [fluticasone] Palpitations   Protriptyline Hcl Other (See Comments)   Severe constipation   Tamiflu [oseltamivir Phosphate] Other (See Comments)   "like I was having a stroke"   Tamiflu [oseltamivir] Anaphylaxis   Tizanidine Other (See Comments)   panic   Gabapentin Other (See Comments)   arthralgia Other reaction(s): Arthralgia (Joint Pain)   Other Hives, Rash   boiron acteane   Qvar [beclomethasone]  Other (See Comments)   Spiriva [tiotropium Bromide Monohydrate] Rash   Tiotropium Rash   Zonegran Other (See Comments)   Mood swings   Advair Diskus [fluticasone-salmeterol] Rash   Baclofen Rash   Chlorzoxazone Rash   Lamotrigine Rash   Levofloxacin Rash   Protriptyline Rash   Ropinirole Rash   Tiotropium Bromide Monohydrate Rash   Verapamil Rash        Medication List        Accurate as of August 13, 2021  2:18 PM. If you have any questions, ask your nurse or doctor.          albuterol 108 (90 Base) MCG/ACT inhaler Commonly known as: ProAir HFA Inhale 2 puffs into the lungs every 6 (six) hours as needed for wheezing or shortness of breath.   albuterol (2.5 MG/3ML) 0.083% nebulizer solution Commonly known as: PROVENTIL Take 3 mLs (2.5 mg total) by nebulization every 6 (six) hours as needed for wheezing or shortness of breath.   azelastine 0.1 % nasal spray Commonly known as: ASTELIN Place 1 spray into both nostrils 2 (two) times daily.   beclomethasone 42 MCG/SPRAY nasal spray Commonly known as: BECONASE-AQ 2 puffs each nostril once daily   benzonatate 100 MG capsule Commonly known as: TESSALON Take 1 capsule (100 mg total) by mouth every 6 (six) hours as needed for cough.   buPROPion 300 MG 24 hr tablet Commonly known as: WELLBUTRIN XL Take 1 tablet (  300 mg total) by mouth daily with breakfast.   CALCIUM 1200 PO Take 1,250 mg by mouth daily.   Centrum Silver tablet Take 1 tablet by mouth daily.   clopidogrel 75 MG tablet Commonly known as: PLAVIX Take 1 tablet (75 mg total) by mouth daily.   cyclobenzaprine 10 MG tablet Commonly known as: FLEXERIL Take 10 mg by mouth every 8 (eight) hours as needed.   ESTRACE VAGINAL 0.1 MG/GM vaginal cream Generic drug: estradiol Place 1 Applicatorful vaginally daily. Small amount each dose per pt   ezetimibe 10 MG tablet Commonly known as: Zetia TAKE 1 TABLET BY MOUTH DAILY.   fexofenadine 180 MG  tablet Commonly known as: Allegra Allergy Take 1 tablet (180 mg total) by mouth daily.   Fish Oil 1200 MG Caps Take 1,200 mg by mouth at bedtime.   hydrOXYzine 10 MG tablet Commonly known as: ATARAX/VISTARIL Take 10 mg by mouth 3 (three) times daily as needed.   hyoscyamine 0.125 MG Tbdp disintergrating tablet Commonly known as: ANASPAZ Place 1 tablet (0.125 mg total) under the tongue 2 (two) times daily as needed. What changed: reasons to take this   lidocaine 5 % Commonly known as: LIDODERM Place 1 patch onto the skin as needed (pain). Remove & Discard patch within 12 hours or as directed by MD   LORazepam 1 MG tablet Commonly known as: ATIVAN Take 1 tablet (1 mg total) by mouth 2 (two) times daily. May take extra 3rd dose if needed What changed:  when to take this reasons to take this   magnesium gluconate 500 MG tablet Commonly known as: MAGONATE Take 500 mg by mouth daily as needed (constipation).   meclizine 25 MG tablet Commonly known as: ANTIVERT Take 1 tablet (25 mg total) by mouth 3 (three) times daily as needed for dizziness. 3 month supply What changed: when to take this   metoprolol succinate 50 MG 24 hr tablet Commonly known as: TOPROL-XL Take 1 tablet (50 mg total) by mouth daily with breakfast. What changed: how much to take   montelukast 10 MG tablet Commonly known as: SINGULAIR TAKE ONE TABLET BY MOUTH EVERY DAY   morphine 15 MG tablet Commonly known as: MSIR Take 15 mg by mouth every 4 (four) hours as needed for severe pain.   morphine 30 MG 12 hr tablet Commonly known as: MS CONTIN Take 1 tablet by mouth every 12 (twelve) hours.   Nebulizer Devi Use as directed   OXYGEN Inhale 2.5 L/hr into the lungs continuous.   pantoprazole 40 MG tablet Commonly known as: PROTONIX TAKE 1 TABLET (40 MG TOTAL) BY MOUTH DAILY.   Polyethyl Glycol-Propyl Glycol 0.4-0.3 % Soln Apply 1 drop to eye daily as needed (dry eyes).   pravastatin 40 MG  tablet Commonly known as: PRAVACHOL Take 1 tablet (40 mg total) by mouth at bedtime.   promethazine 25 MG tablet Commonly known as: PHENERGAN Take 25 mg by mouth every 8 (eight) hours as needed for nausea or vomiting.   TART CHERRY ADVANCED PO Take 5 mLs by mouth daily. Juice Concentrate- 1tsp daily   Zovirax 5 % Generic drug: acyclovir ointment Apply 1 application topically as needed (flair).        Allergies:  Allergies  Allergen Reactions   Epinephrine Palpitations    Raises heart rate    Flonase [Fluticasone] Palpitations   Protriptyline Hcl Other (See Comments)    Severe constipation   Tamiflu [Oseltamivir Phosphate] Other (See Comments)    "like  I was having a stroke"   Tamiflu [Oseltamivir] Anaphylaxis   Tizanidine Other (See Comments)    panic   Gabapentin Other (See Comments)    arthralgia Other reaction(s): Arthralgia (Joint Pain)   Other Hives and Rash    boiron acteane   Qvar [Beclomethasone] Other (See Comments)   Spiriva [Tiotropium Bromide Monohydrate] Rash   Tiotropium Rash   Zonegran Other (See Comments)    Mood swings   Advair Diskus [Fluticasone-Salmeterol] Rash   Baclofen Rash   Chlorzoxazone Rash   Lamotrigine Rash   Levofloxacin Rash   Protriptyline Rash   Ropinirole Rash   Tiotropium Bromide Monohydrate Rash   Verapamil Rash    Past Medical History, Surgical history, Social history, and Family History were reviewed and updated.  Review of Systems: All other 10 point review of systems is negative.   Physical Exam:  vitals were not taken for this visit.   Wt Readings from Last 3 Encounters:  08/02/21 194 lb 12.8 oz (88.4 kg)  06/12/21 201 lb (91.2 kg)  05/11/21 207 lb (93.9 kg)    Ocular: Sclerae unicteric, pupils equal, round and reactive to light Ear-nose-throat: Oropharynx clear, dentition fair Lymphatic: No cervical or supraclavicular adenopathy Lungs no rales or rhonchi, good excursion bilaterally Heart regular rate and  rhythm, no murmur appreciated Abd soft, nontender, positive bowel sounds MSK no focal spinal tenderness, no joint edema Neuro: non-focal, well-oriented, appropriate affect Breasts: Deferred   Lab Results  Component Value Date   WBC 6.0 08/13/2021   HGB 12.2 08/13/2021   HCT 39.4 08/13/2021   MCV 75.6 (L) 08/13/2021   PLT 230 08/13/2021   Lab Results  Component Value Date   FERRITIN 4 (L) 07/13/2021   IRON 37 (L) 07/13/2021   TIBC 496 (H) 07/13/2021   UIBC 460 (H) 07/13/2021   IRONPCTSAT 7 (L) 07/13/2021   Lab Results  Component Value Date   RETICCTPCT 1.1 04/27/2015   RBC 5.21 (H) 08/13/2021   RETICCTABS 55.1 04/27/2015   No results found for: KPAFRELGTCHN, LAMBDASER, KAPLAMBRATIO No results found for: IGGSERUM, IGA, IGMSERUM No results found for: Odetta Pink, SPEI   Chemistry      Component Value Date/Time   NA 141 07/13/2021 1035   NA 147 (H) 10/08/2017 1410   NA 141 02/17/2017 1132   K 4.8 07/13/2021 1035   K 4.6 10/08/2017 1410   K 5.3 (H) 02/17/2017 1132   CL 104 07/13/2021 1035   CL 106 10/08/2017 1410   CO2 31 07/13/2021 1035   CO2 30 10/08/2017 1410   CO2 29 02/17/2017 1132   BUN 15 07/13/2021 1035   BUN 15 10/08/2017 1410   BUN 17.8 02/17/2017 1132   CREATININE 0.87 07/13/2021 1035   CREATININE 1.0 10/08/2017 1410   CREATININE 0.8 02/17/2017 1132      Component Value Date/Time   CALCIUM 9.5 07/13/2021 1035   CALCIUM 9.0 10/08/2017 1410   CALCIUM 9.3 02/17/2017 1132   ALKPHOS 59 07/13/2021 1035   ALKPHOS 59 10/08/2017 1410   ALKPHOS 79 02/17/2017 1132   AST 23 07/13/2021 1035   AST 22 02/17/2017 1132   ALT 17 07/13/2021 1035   ALT 26 10/08/2017 1410   ALT 20 02/17/2017 1132   BILITOT 0.4 07/13/2021 1035   BILITOT 0.39 02/17/2017 1132       Impression and Plan: Ms. Schaadt is a very pleasant 70 yo caucasian female with secondary polycythemia (JAK-2 negative) due to COPD.  We will proceed  with phlebotomy today for Hct 39.4 followed by replacement fluids.  Follow-up in 1 month. She can contact our office with any questions or concerns.   Lottie Dawson, NP 9/12/20222:18 PM

## 2021-08-13 NOTE — Patient Instructions (Signed)

## 2021-08-14 ENCOUNTER — Telehealth: Payer: Self-pay | Admitting: *Deleted

## 2021-08-14 LAB — IRON AND TIBC
Iron: 54 ug/dL (ref 28–170)
Saturation Ratios: 10 % — ABNORMAL LOW (ref 10.4–31.8)
TIBC: 550 ug/dL — ABNORMAL HIGH (ref 250–450)
UIBC: 496 ug/dL

## 2021-08-14 LAB — FERRITIN: Ferritin: 5 ng/mL — ABNORMAL LOW (ref 11–307)

## 2021-08-14 NOTE — Telephone Encounter (Signed)
Per 08/13/21 los called and gave upcoming appointments - confirmed

## 2021-09-10 ENCOUNTER — Telehealth: Payer: Self-pay | Admitting: *Deleted

## 2021-09-10 NOTE — Telephone Encounter (Signed)
Received call from patient stating that she has been having a sharp stabbing sporadic pain in the right side of her ribs where the sternum meets the ribcage.  This is not brought on by breathing or activity.  It just happens occasionally.  Started 2 nights ago. Reviewed with Dr Marin Olp who suggested patient contact her primary care physician as this could be what has happened to her in the past.  Called pam to let her know.

## 2021-09-12 ENCOUNTER — Ambulatory Visit: Payer: Medicare HMO

## 2021-09-12 ENCOUNTER — Other Ambulatory Visit: Payer: Medicare HMO

## 2021-09-12 ENCOUNTER — Ambulatory Visit: Payer: Medicare HMO | Admitting: Family

## 2021-09-19 ENCOUNTER — Telehealth: Payer: Self-pay | Admitting: Internal Medicine

## 2021-09-19 MED ORDER — BECLOMETHASONE DIPROP MONOHYD 42 MCG/SPRAY NA SUSP: NASAL | 3 refills | Status: DC

## 2021-09-19 MED ORDER — AZELASTINE HCL 0.1 % NA SOLN: 1.0000 | Freq: Two times a day (BID) | NASAL | 3 refills | Status: DC

## 2021-09-19 MED ORDER — BENZONATATE 100 MG PO CAPS: 100.0000 mg | ORAL_CAPSULE | Freq: Four times a day (QID) | ORAL | 3 refills | Status: DC | PRN

## 2021-09-19 NOTE — Telephone Encounter (Signed)
Refills have been sent to the pharmacy per pts request.  Nothing further is needed.

## 2021-09-20 ENCOUNTER — Encounter: Payer: Self-pay | Admitting: Family

## 2021-09-20 ENCOUNTER — Inpatient Hospital Stay (HOSPITAL_BASED_OUTPATIENT_CLINIC_OR_DEPARTMENT_OTHER): Payer: Medicare HMO | Admitting: Family

## 2021-09-20 ENCOUNTER — Inpatient Hospital Stay: Payer: Medicare HMO | Attending: Hematology & Oncology

## 2021-09-20 ENCOUNTER — Ambulatory Visit: Payer: Medicare HMO

## 2021-09-20 ENCOUNTER — Telehealth: Payer: Self-pay | Admitting: *Deleted

## 2021-09-20 ENCOUNTER — Other Ambulatory Visit: Payer: Self-pay

## 2021-09-20 VITALS — BP 111/45 | HR 85 | Resp 19 | Ht 67.0 in | Wt 199.4 lb

## 2021-09-20 DIAGNOSIS — D751 Secondary polycythemia: Secondary | ICD-10-CM | POA: Diagnosis present

## 2021-09-20 DIAGNOSIS — E611 Iron deficiency: Secondary | ICD-10-CM | POA: Insufficient documentation

## 2021-09-20 DIAGNOSIS — Z7902 Long term (current) use of antithrombotics/antiplatelets: Secondary | ICD-10-CM | POA: Insufficient documentation

## 2021-09-20 DIAGNOSIS — D500 Iron deficiency anemia secondary to blood loss (chronic): Secondary | ICD-10-CM

## 2021-09-20 LAB — CBC WITH DIFFERENTIAL (CANCER CENTER ONLY)
Abs Immature Granulocytes: 0.01 K/uL (ref 0.00–0.07)
Basophils Absolute: 0 K/uL (ref 0.0–0.1)
Basophils Relative: 0 %
Eosinophils Absolute: 0.1 K/uL (ref 0.0–0.5)
Eosinophils Relative: 2 %
HCT: 35.6 % — ABNORMAL LOW (ref 36.0–46.0)
Hemoglobin: 10.7 g/dL — ABNORMAL LOW (ref 12.0–15.0)
Immature Granulocytes: 0 %
Lymphocytes Relative: 18 %
Lymphs Abs: 1.2 K/uL (ref 0.7–4.0)
MCH: 22.5 pg — ABNORMAL LOW (ref 26.0–34.0)
MCHC: 30.1 g/dL (ref 30.0–36.0)
MCV: 74.9 fL — ABNORMAL LOW (ref 80.0–100.0)
Monocytes Absolute: 0.6 K/uL (ref 0.1–1.0)
Monocytes Relative: 9 %
Neutro Abs: 4.8 K/uL (ref 1.7–7.7)
Neutrophils Relative %: 71 %
Platelet Count: 248 K/uL (ref 150–400)
RBC: 4.75 MIL/uL (ref 3.87–5.11)
RDW: 15.9 % — ABNORMAL HIGH (ref 11.5–15.5)
WBC Count: 6.8 K/uL (ref 4.0–10.5)
nRBC: 0 % (ref 0.0–0.2)

## 2021-09-20 LAB — CMP (CANCER CENTER ONLY)
ALT: 14 U/L (ref 0–44)
AST: 18 U/L (ref 15–41)
Albumin: 4 g/dL (ref 3.5–5.0)
Alkaline Phosphatase: 57 U/L (ref 38–126)
Anion gap: 6 (ref 5–15)
BUN: 16 mg/dL (ref 8–23)
CO2: 27 mmol/L (ref 22–32)
Calcium: 9.5 mg/dL (ref 8.9–10.3)
Chloride: 106 mmol/L (ref 98–111)
Creatinine: 0.78 mg/dL (ref 0.44–1.00)
GFR, Estimated: >60 mL/min (ref >60)
Glucose, Bld: 81 mg/dL (ref 70–99)
Potassium: 4.2 mmol/L (ref 3.5–5.1)
Sodium: 139 mmol/L (ref 135–145)
Total Bilirubin: 0.4 mg/dL (ref 0.3–1.2)
Total Protein: 6.4 g/dL — ABNORMAL LOW (ref 6.5–8.1)

## 2021-09-20 NOTE — Telephone Encounter (Signed)
Per 09/20/21 los gave upcoming appointments - confirmed

## 2021-09-20 NOTE — Progress Notes (Signed)
Hematology and Oncology Follow Up Visit  Natalie Hendrix [id number] May 20, 1951 70 y.o. 09/20/2021   Principle Diagnosis:  Secondary polycythemia - JAK2 negative   Current Therapy:        Phlebotomy to maintain hematocrit less than 38% Plavix 75 mg by mouth daily   Interim History:  Natalie Hendrix is here today with her husband for follow-up. She is doing well but still notes issues with brain fog and short term memory recall.  Hct today is 35.6%. We discussed iron deficiency with phlebotomy and associated side effects. She has occasional episodes of dizziness. SOB is stable. She is on 2L supplemental O2. No fever, chills, n/v, cough, rash, chest pain, palpitations, abdominal pain or changes in bowel or bladder habits.  No blood loss, bruising or petechiae.  No swelling in her extremities.  No falls or syncope to report. She ambulates with a cane or Rolator as needed for added support.  She has a good appetite and is staying well hydrated. Her weight is stable at 199 lbs.   ECOG Performance Status: 1 - Symptomatic but completely ambulatory  Medications:  Allergies as of 09/20/2021       Reactions   Epinephrine Palpitations   Raises heart rate   Flonase [fluticasone] Palpitations   Protriptyline Hcl Other (See Comments)   Severe constipation   Tamiflu [oseltamivir Phosphate] Other (See Comments)   "like I was having a stroke"   Tamiflu [oseltamivir] Anaphylaxis   Tizanidine Other (See Comments)   panic   Gabapentin Other (See Comments)   arthralgia Other reaction(s): Arthralgia (Joint Pain)   Other Hives, Rash   boiron acteane   Qvar [beclomethasone] Other (See Comments)   Spiriva [tiotropium Bromide Monohydrate] Rash   Tiotropium Rash   Zonegran Other (See Comments)   Mood swings   Advair Diskus [fluticasone-salmeterol] Rash   Baclofen Rash   Chlorzoxazone Rash   Lamotrigine Rash   Levofloxacin Rash   Protriptyline Rash   Ropinirole Rash   Tiotropium Bromide  Monohydrate Rash   Verapamil Rash        Medication List        Accurate as of September 20, 2021 11:04 AM. If you have any questions, ask your nurse or doctor.          albuterol 108 (90 Base) MCG/ACT inhaler Commonly known as: ProAir HFA Inhale 2 puffs into the lungs every 6 (six) hours as needed for wheezing or shortness of breath.   albuterol (2.5 MG/3ML) 0.083% nebulizer solution Commonly known as: PROVENTIL Take 3 mLs (2.5 mg total) by nebulization every 6 (six) hours as needed for wheezing or shortness of breath.   azelastine 0.1 % nasal spray Commonly known as: ASTELIN Place 1 spray into both nostrils 2 (two) times daily.   beclomethasone 42 MCG/SPRAY nasal spray Commonly known as: BECONASE-AQ 2 puffs each nostril once daily   benzonatate 100 MG capsule Commonly known as: TESSALON Take 1 capsule (100 mg total) by mouth every 6 (six) hours as needed for cough.   buPROPion 300 MG 24 hr tablet Commonly known as: WELLBUTRIN XL Take 1 tablet (300 mg total) by mouth daily with breakfast.   CALCIUM 1200 PO Take 1,250 mg by mouth daily.   Centrum Silver tablet Take 1 tablet by mouth daily.   clopidogrel 75 MG tablet Commonly known as: PLAVIX Take 1 tablet (75 mg total) by mouth daily.   cyclobenzaprine 10 MG tablet Commonly known as: FLEXERIL Take 10 mg by mouth every 8 (  eight) hours as needed.   ESTRACE VAGINAL 0.1 MG/GM vaginal cream Generic drug: estradiol Place 1 Applicatorful vaginally daily. Small amount each dose per pt   ezetimibe 10 MG tablet Commonly known as: Zetia TAKE 1 TABLET BY MOUTH DAILY.   fexofenadine 180 MG tablet Commonly known as: Allegra Allergy Take 1 tablet (180 mg total) by mouth daily.   Fish Oil 1200 MG Caps Take 1,200 mg by mouth at bedtime.   hydrOXYzine 10 MG tablet Commonly known as: ATARAX/VISTARIL Take 10 mg by mouth 3 (three) times daily as needed.   hyoscyamine 0.125 MG Tbdp disintergrating tablet Commonly  known as: ANASPAZ Place 1 tablet (0.125 mg total) under the tongue 2 (two) times daily as needed. What changed: reasons to take this   lidocaine 5 % Commonly known as: LIDODERM Place 1 patch onto the skin as needed (pain). Remove & Discard patch within 12 hours or as directed by MD   LORazepam 1 MG tablet Commonly known as: ATIVAN Take 1 tablet (1 mg total) by mouth 2 (two) times daily. May take extra 3rd dose if needed What changed:  when to take this reasons to take this   magnesium gluconate 500 MG tablet Commonly known as: MAGONATE Take 500 mg by mouth daily as needed (constipation).   meclizine 25 MG tablet Commonly known as: ANTIVERT Take 1 tablet (25 mg total) by mouth 3 (three) times daily as needed for dizziness. 3 month supply What changed: when to take this   metoprolol succinate 50 MG 24 hr tablet Commonly known as: TOPROL-XL Take 1 tablet (50 mg total) by mouth daily with breakfast. What changed: how much to take   montelukast 10 MG tablet Commonly known as: SINGULAIR TAKE ONE TABLET BY MOUTH EVERY DAY   morphine 15 MG tablet Commonly known as: MSIR Take 15 mg by mouth every 4 (four) hours as needed for severe pain.   morphine 30 MG 12 hr tablet Commonly known as: MS CONTIN Take 1 tablet by mouth every 12 (twelve) hours.   Nebulizer Devi Use as directed   OXYGEN Inhale 2.5 L/hr into the lungs continuous.   pantoprazole 40 MG tablet Commonly known as: PROTONIX TAKE 1 TABLET (40 MG TOTAL) BY MOUTH DAILY.   Polyethyl Glycol-Propyl Glycol 0.4-0.3 % Soln Apply 1 drop to eye daily as needed (dry eyes).   pravastatin 40 MG tablet Commonly known as: PRAVACHOL Take 1 tablet (40 mg total) by mouth at bedtime.   promethazine 25 MG tablet Commonly known as: PHENERGAN Take 25 mg by mouth every 8 (eight) hours as needed for nausea or vomiting.   TART CHERRY ADVANCED PO Take 5 mLs by mouth daily. Juice Concentrate- 1tsp daily   Zovirax 5 % Generic drug:  acyclovir ointment Apply 1 application topically as needed (flair).        Allergies:  Allergies  Allergen Reactions   Epinephrine Palpitations    Raises heart rate    Flonase [Fluticasone] Palpitations   Protriptyline Hcl Other (See Comments)    Severe constipation   Tamiflu [Oseltamivir Phosphate] Other (See Comments)    "like I was having a stroke"   Tamiflu [Oseltamivir] Anaphylaxis   Tizanidine Other (See Comments)    panic   Gabapentin Other (See Comments)    arthralgia Other reaction(s): Arthralgia (Joint Pain)   Other Hives and Rash    boiron acteane   Qvar [Beclomethasone] Other (See Comments)   Spiriva [Tiotropium Bromide Monohydrate] Rash   Tiotropium Rash  Zonegran Other (See Comments)    Mood swings   Advair Diskus [Fluticasone-Salmeterol] Rash   Baclofen Rash   Chlorzoxazone Rash   Lamotrigine Rash   Levofloxacin Rash   Protriptyline Rash   Ropinirole Rash   Tiotropium Bromide Monohydrate Rash   Verapamil Rash    Past Medical History, Surgical history, Social history, and Family History were reviewed and updated.  Review of Systems: All other 10 point review of systems is negative.   Physical Exam:  height is 5\' 7"  (1.702 m) and weight is 199 lb 6.4 oz (90.4 kg). Her blood pressure is 111/45 (abnormal) and her pulse is 85. Her respiration is 19 and oxygen saturation is 99%.   Wt Readings from Last 3 Encounters:  09/20/21 199 lb 6.4 oz (90.4 kg)  08/13/21 194 lb 1.9 oz (88.1 kg)  08/02/21 194 lb 12.8 oz (88.4 kg)    Ocular: Sclerae unicteric, pupils equal, round and reactive to light Ear-nose-throat: Oropharynx clear, dentition fair Lymphatic: No cervical or supraclavicular adenopathy Lungs no rales or rhonchi, good excursion bilaterally Heart regular rate and rhythm, no murmur appreciated Abd soft, nontender, positive bowel sounds MSK no focal spinal tenderness, no joint edema Neuro: non-focal, well-oriented, appropriate affect Breasts:  Deferred  Lab Results  Component Value Date   WBC 6.8 09/20/2021   HGB 10.7 (L) 09/20/2021   HCT 35.6 (L) 09/20/2021   MCV 74.9 (L) 09/20/2021   PLT 248 09/20/2021   Lab Results  Component Value Date   FERRITIN 5 (L) 08/13/2021   IRON 54 08/13/2021   TIBC 550 (H) 08/13/2021   UIBC 496 08/13/2021   IRONPCTSAT 10 (L) 08/13/2021   Lab Results  Component Value Date   RETICCTPCT 1.1 04/27/2015   RBC 4.75 09/20/2021   RETICCTABS 55.1 04/27/2015   No results found for: KPAFRELGTCHN, LAMBDASER, KAPLAMBRATIO No results found for: Kandis Cocking, IGMSERUM No results found for: Odetta Pink, SPEI   Chemistry      Component Value Date/Time   NA 139 09/20/2021 1015   NA 147 (H) 10/08/2017 1410   NA 141 02/17/2017 1132   K 4.2 09/20/2021 1015   K 4.6 10/08/2017 1410   K 5.3 (H) 02/17/2017 1132   CL 106 09/20/2021 1015   CL 106 10/08/2017 1410   CO2 27 09/20/2021 1015   CO2 30 10/08/2017 1410   CO2 29 02/17/2017 1132   BUN 16 09/20/2021 1015   BUN 15 10/08/2017 1410   BUN 17.8 02/17/2017 1132   CREATININE 0.78 09/20/2021 1015   CREATININE 1.0 10/08/2017 1410   CREATININE 0.8 02/17/2017 1132      Component Value Date/Time   CALCIUM 9.5 09/20/2021 1015   CALCIUM 9.0 10/08/2017 1410   CALCIUM 9.3 02/17/2017 1132   ALKPHOS 57 09/20/2021 1015   ALKPHOS 59 10/08/2017 1410   ALKPHOS 79 02/17/2017 1132   AST 18 09/20/2021 1015   AST 22 02/17/2017 1132   ALT 14 09/20/2021 1015   ALT 26 10/08/2017 1410   ALT 20 02/17/2017 1132   BILITOT 0.4 09/20/2021 1015   BILITOT 0.39 02/17/2017 1132       Impression and Plan: Natalie Hendrix is a very pleasant 70 yo caucasian female with secondary polycythemia (JAK-2 negative) due to COPD. No phlebotomy this visit. Follow-up in 1 month.  She can contact our office with any questions or concerns.  Lottie Dawson, NP 10/20/202211:04 AM

## 2021-09-21 LAB — IRON AND TIBC
Iron: 32 ug/dL (ref 28–170)
Saturation Ratios: 6 % — ABNORMAL LOW (ref 10.4–31.8)
TIBC: 532 ug/dL — ABNORMAL HIGH (ref 250–450)
UIBC: 500 ug/dL

## 2021-09-21 LAB — FERRITIN: Ferritin: 4 ng/mL — ABNORMAL LOW (ref 11–307)

## 2021-09-28 ENCOUNTER — Emergency Department (HOSPITAL_COMMUNITY): Payer: Medicare HMO

## 2021-09-28 ENCOUNTER — Emergency Department (HOSPITAL_COMMUNITY)
Admission: EM | Admit: 2021-09-28 | Discharge: 2021-09-28 | Disposition: A | Discharge status: Home / Self Care | Payer: Medicare HMO | Attending: Emergency Medicine | Admitting: Emergency Medicine

## 2021-09-28 ENCOUNTER — Encounter (HOSPITAL_COMMUNITY): Payer: Self-pay | Admitting: *Deleted

## 2021-09-28 DIAGNOSIS — F0390 Unspecified dementia without behavioral disturbance: Secondary | ICD-10-CM | POA: Insufficient documentation

## 2021-09-28 DIAGNOSIS — S91311A Laceration without foreign body, right foot, initial encounter: Secondary | ICD-10-CM | POA: Insufficient documentation

## 2021-09-28 DIAGNOSIS — S99921A Unspecified injury of right foot, initial encounter: Secondary | ICD-10-CM | POA: Diagnosis present

## 2021-09-28 DIAGNOSIS — Z79899 Other long term (current) drug therapy: Secondary | ICD-10-CM | POA: Diagnosis not present

## 2021-09-28 DIAGNOSIS — W25XXXA Contact with sharp glass, initial encounter: Secondary | ICD-10-CM | POA: Diagnosis not present

## 2021-09-28 DIAGNOSIS — I10 Essential (primary) hypertension: Secondary | ICD-10-CM | POA: Diagnosis not present

## 2021-09-28 DIAGNOSIS — J4520 Mild intermittent asthma, uncomplicated: Secondary | ICD-10-CM | POA: Insufficient documentation

## 2021-09-28 DIAGNOSIS — Y92009 Unspecified place in unspecified non-institutional (private) residence as the place of occurrence of the external cause: Secondary | ICD-10-CM | POA: Diagnosis not present

## 2021-09-28 MED ORDER — POVIDONE-IODINE 10 % EX SOLN
CUTANEOUS | Status: DC | PRN
  Administered 2021-09-28: 1 via TOPICAL
  Filled 2021-09-28 (×2): qty 15

## 2021-09-28 MED ORDER — DOXYCYCLINE HYCLATE 100 MG PO CAPS: 100.0000 mg | ORAL_CAPSULE | Freq: Two times a day (BID) | ORAL | 0 refills | Status: DC

## 2021-09-28 MED ORDER — OXYCODONE-ACETAMINOPHEN 5-325 MG PO TABS
1.0000 | ORAL_TABLET | Freq: Once | ORAL | Status: AC
Start: 2021-09-28 — End: 2021-09-28
  Administered 2021-09-28: 1 via ORAL
  Filled 2021-09-28: qty 1

## 2021-09-28 MED ORDER — DOXYCYCLINE HYCLATE 100 MG PO TABS
100.0000 mg | ORAL_TABLET | Freq: Once | ORAL | Status: AC
  Administered 2021-09-28: 100 mg via ORAL
  Filled 2021-09-28: qty 1

## 2021-09-28 NOTE — ED Notes (Addendum)
Pt soaking foot in betadine solution.

## 2021-09-28 NOTE — Discharge Instructions (Signed)
Keep your foot clean and dry, avoid weightbearing on your heel as much as possible until you can tolerate weight bearing without increasing your pain.  Elevate your foot is much as possible for the next 1 to 2 days if needed for pain or swelling.  Get rechecked immediately for any signs of infection including redness, swelling or drainage of purulence.  Take the entire course of the antibiotics prescribed, with your next dose taken tomorrow morning.  Allow the Steri-Strips to fall off on their own.

## 2021-09-28 NOTE — ED Triage Notes (Signed)
Puncture wound to left hell, bandaged on arrival
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
## 2021-10-22 ENCOUNTER — Inpatient Hospital Stay: Payer: Medicare HMO

## 2021-10-22 ENCOUNTER — Inpatient Hospital Stay: Payer: Medicare HMO | Admitting: Family

## 2021-11-07 ENCOUNTER — Other Ambulatory Visit (HOSPITAL_BASED_OUTPATIENT_CLINIC_OR_DEPARTMENT_OTHER): Payer: Self-pay

## 2021-11-07 ENCOUNTER — Encounter: Payer: Self-pay | Admitting: Family

## 2021-11-07 ENCOUNTER — Inpatient Hospital Stay (HOSPITAL_BASED_OUTPATIENT_CLINIC_OR_DEPARTMENT_OTHER): Payer: Medicare HMO | Admitting: Family

## 2021-11-07 ENCOUNTER — Other Ambulatory Visit: Payer: Self-pay

## 2021-11-07 ENCOUNTER — Inpatient Hospital Stay: Payer: Medicare HMO | Attending: Hematology & Oncology

## 2021-11-07 VITALS — BP 123/55 | Temp 98.5°F | Resp 18 | Wt 201.0 lb

## 2021-11-07 DIAGNOSIS — Z7902 Long term (current) use of antithrombotics/antiplatelets: Secondary | ICD-10-CM | POA: Insufficient documentation

## 2021-11-07 DIAGNOSIS — Z9981 Dependence on supplemental oxygen: Secondary | ICD-10-CM | POA: Insufficient documentation

## 2021-11-07 DIAGNOSIS — R0602 Shortness of breath: Secondary | ICD-10-CM | POA: Diagnosis not present

## 2021-11-07 DIAGNOSIS — Z79899 Other long term (current) drug therapy: Secondary | ICD-10-CM | POA: Insufficient documentation

## 2021-11-07 DIAGNOSIS — D751 Secondary polycythemia: Secondary | ICD-10-CM | POA: Diagnosis present

## 2021-11-07 DIAGNOSIS — J449 Chronic obstructive pulmonary disease, unspecified: Secondary | ICD-10-CM | POA: Diagnosis not present

## 2021-11-07 DIAGNOSIS — D500 Iron deficiency anemia secondary to blood loss (chronic): Secondary | ICD-10-CM | POA: Insufficient documentation

## 2021-11-07 LAB — CBC WITH DIFFERENTIAL (CANCER CENTER ONLY)
Abs Immature Granulocytes: 0.01 10*3/uL (ref 0.00–0.07)
Basophils Absolute: 0 10*3/uL (ref 0.0–0.1)
Basophils Relative: 1 %
Eosinophils Absolute: 0.2 10*3/uL (ref 0.0–0.5)
Eosinophils Relative: 3 %
HCT: 37.4 % (ref 36.0–46.0)
Hemoglobin: 11.1 g/dL — ABNORMAL LOW (ref 12.0–15.0)
Immature Granulocytes: 0 %
Lymphocytes Relative: 25 %
Lymphs Abs: 1.5 10*3/uL (ref 0.7–4.0)
MCH: 22.2 pg — ABNORMAL LOW (ref 26.0–34.0)
MCHC: 29.7 g/dL — ABNORMAL LOW (ref 30.0–36.0)
MCV: 74.7 fL — ABNORMAL LOW (ref 80.0–100.0)
Monocytes Absolute: 0.5 10*3/uL (ref 0.1–1.0)
Monocytes Relative: 8 %
Neutro Abs: 3.7 10*3/uL (ref 1.7–7.7)
Neutrophils Relative %: 63 %
Platelet Count: 270 10*3/uL (ref 150–400)
RBC: 5.01 MIL/uL (ref 3.87–5.11)
RDW: 16.3 % — ABNORMAL HIGH (ref 11.5–15.5)
WBC Count: 5.9 10*3/uL (ref 4.0–10.5)
nRBC: 0 % (ref 0.0–0.2)

## 2021-11-07 LAB — CMP (CANCER CENTER ONLY)
ALT: 17 U/L (ref 0–44)
AST: 20 U/L (ref 15–41)
Albumin: 4 g/dL (ref 3.5–5.0)
Alkaline Phosphatase: 58 U/L (ref 38–126)
Anion gap: 5 (ref 5–15)
BUN: 19 mg/dL (ref 8–23)
CO2: 31 mmol/L (ref 22–32)
Calcium: 9.6 mg/dL (ref 8.9–10.3)
Chloride: 105 mmol/L (ref 98–111)
Creatinine: 0.77 mg/dL (ref 0.44–1.00)
GFR, Estimated: >60 mL/min (ref >60)
Glucose, Bld: 116 mg/dL — ABNORMAL HIGH (ref 70–99)
Potassium: 4.9 mmol/L (ref 3.5–5.1)
Sodium: 141 mmol/L (ref 135–145)
Total Bilirubin: 0.4 mg/dL (ref 0.3–1.2)
Total Protein: 6.4 g/dL — ABNORMAL LOW (ref 6.5–8.1)

## 2021-11-07 MED ORDER — FLUAD QUADRIVALENT 0.5 ML IM PRSY
PREFILLED_SYRINGE | INTRAMUSCULAR | 0 refills | Status: DC
  Filled 2021-11-07: qty 0.5, 1d supply, fill #0

## 2021-11-07 NOTE — Progress Notes (Signed)
Hematology and Oncology Follow Up Visit  Natalie Hendrix [id number] 05/03/1951 70 y.o. 11/07/2021   Principle Diagnosis:  Secondary polycythemia - JAK2 negative   Current Therapy:        Phlebotomy to maintain hematocrit less than 38% Plavix 75 mg by mouth daily   Interim History:  Natalie Hendrix is here today for follow-up. No phlebotomy needed, Hct 37.4%.  She is doing well and has noted taking a 20 minute nap as needed has improved her energy.  SOB is stable on 2L supplemental O2.  No fever, chills, n/v, cough, rash, chest pain, abdominal pain or changes in bowel or bladder habits.  She has intermittent headaches with the changes in weather.  She also has occasional episodes of dizziness.  Neuropathy is unchanged from baseline.  No swelling noted in her extremities. Pedal pulses are 2+.  No falls or syncope to report.  She is eating well and making an effort to stay well hydrated. Her weight is stable at 201 lbs.   ECOG Performance Status: 1 - Symptomatic but completely ambulatory  Medications:  Allergies as of 11/07/2021       Reactions   Epinephrine Palpitations   Raises heart rate   Flonase [fluticasone] Palpitations   Protriptyline Hcl Other (See Comments)   Severe constipation   Tamiflu [oseltamivir Phosphate] Other (See Comments)   "like I was having a stroke"   Tamiflu [oseltamivir] Anaphylaxis   Tizanidine Other (See Comments)   panic   Gabapentin Other (See Comments)   arthralgia Other reaction(s): Arthralgia (Joint Pain)   Other Hives, Rash   boiron acteane   Qvar [beclomethasone] Other (See Comments)   Spiriva [tiotropium Bromide Monohydrate] Rash   Tiotropium Rash   Zonegran Other (See Comments)   Mood swings   Advair Diskus [fluticasone-salmeterol] Rash   Baclofen Rash   Chlorzoxazone Rash   Lamotrigine Rash   Levofloxacin Rash   Protriptyline Rash   Ropinirole Rash   Tiotropium Bromide Monohydrate Rash   Verapamil Rash        Medication List         Accurate as of November 07, 2021  2:10 PM. If you have any questions, ask your nurse or doctor.          albuterol 108 (90 Base) MCG/ACT inhaler Commonly known as: ProAir HFA Inhale 2 puffs into the lungs every 6 (six) hours as needed for wheezing or shortness of breath.   albuterol (2.5 MG/3ML) 0.083% nebulizer solution Commonly known as: PROVENTIL Take 3 mLs (2.5 mg total) by nebulization every 6 (six) hours as needed for wheezing or shortness of breath.   azelastine 0.1 % nasal spray Commonly known as: ASTELIN Place 1 spray into both nostrils 2 (two) times daily.   beclomethasone 42 MCG/SPRAY nasal spray Commonly known as: BECONASE-AQ 2 puffs each nostril once daily   benzonatate 100 MG capsule Commonly known as: TESSALON Take 1 capsule (100 mg total) by mouth every 6 (six) hours as needed for cough.   buPROPion 300 MG 24 hr tablet Commonly known as: WELLBUTRIN XL Take 1 tablet (300 mg total) by mouth daily with breakfast.   CALCIUM 1200 PO Take 1,250 mg by mouth daily.   Centrum Silver tablet Take 1 tablet by mouth daily.   clopidogrel 75 MG tablet Commonly known as: PLAVIX Take 1 tablet (75 mg total) by mouth daily.   cyclobenzaprine 10 MG tablet Commonly known as: FLEXERIL Take 10 mg by mouth every 8 (eight) hours as needed.  doxycycline 100 MG capsule Commonly known as: VIBRAMYCIN Take 1 capsule (100 mg total) by mouth 2 (two) times daily.   ESTRACE VAGINAL 0.1 MG/GM vaginal cream Generic drug: estradiol Place 1 Applicatorful vaginally daily. Small amount each dose per pt   ezetimibe 10 MG tablet Commonly known as: Zetia TAKE 1 TABLET BY MOUTH DAILY.   fexofenadine 180 MG tablet Commonly known as: Allegra Allergy Take 1 tablet (180 mg total) by mouth daily.   Fish Oil 1200 MG Caps Take 1,200 mg by mouth at bedtime.   hydrOXYzine 10 MG tablet Commonly known as: ATARAX Take 10 mg by mouth 3 (three) times daily as needed.   hyoscyamine  0.125 MG Tbdp disintergrating tablet Commonly known as: ANASPAZ Place 1 tablet (0.125 mg total) under the tongue 2 (two) times daily as needed. What changed: reasons to take this   lidocaine 5 % Commonly known as: LIDODERM Place 1 patch onto the skin as needed (pain). Remove & Discard patch within 12 hours or as directed by MD   LORazepam 1 MG tablet Commonly known as: ATIVAN Take 1 tablet (1 mg total) by mouth 2 (two) times daily. May take extra 3rd dose if needed What changed:  when to take this reasons to take this   magnesium gluconate 500 MG tablet Commonly known as: MAGONATE Take 500 mg by mouth daily as needed (constipation).   meclizine 25 MG tablet Commonly known as: ANTIVERT Take 1 tablet (25 mg total) by mouth 3 (three) times daily as needed for dizziness. 3 month supply What changed: when to take this   metoprolol succinate 50 MG 24 hr tablet Commonly known as: TOPROL-XL Take 1 tablet (50 mg total) by mouth daily with breakfast. What changed: how much to take   montelukast 10 MG tablet Commonly known as: SINGULAIR TAKE ONE TABLET BY MOUTH EVERY DAY   morphine 15 MG tablet Commonly known as: MSIR Take 15 mg by mouth every 4 (four) hours as needed for severe pain.   morphine 30 MG 12 hr tablet Commonly known as: MS CONTIN Take 1 tablet by mouth every 12 (twelve) hours.   Nebulizer Devi Use as directed   OXYGEN Inhale 2.5 L/hr into the lungs continuous.   pantoprazole 40 MG tablet Commonly known as: PROTONIX TAKE 1 TABLET (40 MG TOTAL) BY MOUTH DAILY.   Polyethyl Glycol-Propyl Glycol 0.4-0.3 % Soln Apply 1 drop to eye daily as needed (dry eyes).   pravastatin 40 MG tablet Commonly known as: PRAVACHOL Take 1 tablet (40 mg total) by mouth at bedtime.   promethazine 25 MG tablet Commonly known as: PHENERGAN Take 25 mg by mouth every 8 (eight) hours as needed for nausea or vomiting.   TART CHERRY ADVANCED PO Take 5 mLs by mouth daily. Juice  Concentrate- 1tsp daily   Zovirax 5 % Generic drug: acyclovir ointment Apply 1 application topically as needed (flair).        Allergies:  Allergies  Allergen Reactions   Epinephrine Palpitations    Raises heart rate    Flonase [Fluticasone] Palpitations   Protriptyline Hcl Other (See Comments)    Severe constipation   Tamiflu [Oseltamivir Phosphate] Other (See Comments)    "like I was having a stroke"   Tamiflu [Oseltamivir] Anaphylaxis   Tizanidine Other (See Comments)    panic   Gabapentin Other (See Comments)    arthralgia Other reaction(s): Arthralgia (Joint Pain)   Other Hives and Rash    boiron acteane   Qvar [Beclomethasone]  Other (See Comments)   Spiriva [Tiotropium Bromide Monohydrate] Rash   Tiotropium Rash   Zonegran Other (See Comments)    Mood swings   Advair Diskus [Fluticasone-Salmeterol] Rash   Baclofen Rash   Chlorzoxazone Rash   Lamotrigine Rash   Levofloxacin Rash   Protriptyline Rash   Ropinirole Rash   Tiotropium Bromide Monohydrate Rash   Verapamil Rash    Past Medical History, Surgical history, Social history, and Family History were reviewed and updated.  Review of Systems: All other 10 point review of systems is negative.   Physical Exam:  weight is 201 lb 0.6 oz (91.2 kg). Her oral temperature is 98.5 F (36.9 C). Her blood pressure is 123/55 (abnormal). Her respiration is 18 and oxygen saturation is 98%.   Wt Readings from Last 3 Encounters:  11/07/21 201 lb 0.6 oz (91.2 kg)  09/20/21 199 lb 6.4 oz (90.4 kg)  08/13/21 194 lb 1.9 oz (88.1 kg)    Ocular: Sclerae unicteric, pupils equal, round and reactive to light Ear-nose-throat: Oropharynx clear, dentition fair Lymphatic: No cervical or supraclavicular adenopathy Lungs no rales or rhonchi, good excursion bilaterally Heart regular rate and rhythm, no murmur appreciated Abd soft, nontender, positive bowel sounds MSK no focal spinal tenderness, no joint edema Neuro:  non-focal, well-oriented, appropriate affect Breasts: Deferred   Lab Results  Component Value Date   WBC 5.9 11/07/2021   HGB 11.1 (L) 11/07/2021   HCT 37.4 11/07/2021   MCV 74.7 (L) 11/07/2021   PLT 270 11/07/2021   Lab Results  Component Value Date   FERRITIN 4 (L) 09/20/2021   IRON 32 09/20/2021   TIBC 532 (H) 09/20/2021   UIBC 500 09/20/2021   IRONPCTSAT 6 (L) 09/20/2021   Lab Results  Component Value Date   RETICCTPCT 1.1 04/27/2015   RBC 5.01 11/07/2021   RETICCTABS 55.1 04/27/2015   No results found for: KPAFRELGTCHN, LAMBDASER, KAPLAMBRATIO No results found for: IGGSERUM, IGA, IGMSERUM No results found for: Odetta Pink, SPEI   Chemistry      Component Value Date/Time   NA 141 11/07/2021 1327   NA 147 (H) 10/08/2017 1410   NA 141 02/17/2017 1132   K 4.9 11/07/2021 1327   K 4.6 10/08/2017 1410   K 5.3 (H) 02/17/2017 1132   CL 105 11/07/2021 1327   CL 106 10/08/2017 1410   CO2 31 11/07/2021 1327   CO2 30 10/08/2017 1410   CO2 29 02/17/2017 1132   BUN 19 11/07/2021 1327   BUN 15 10/08/2017 1410   BUN 17.8 02/17/2017 1132   CREATININE 0.77 11/07/2021 1327   CREATININE 1.0 10/08/2017 1410   CREATININE 0.8 02/17/2017 1132      Component Value Date/Time   CALCIUM 9.6 11/07/2021 1327   CALCIUM 9.0 10/08/2017 1410   CALCIUM 9.3 02/17/2017 1132   ALKPHOS 58 11/07/2021 1327   ALKPHOS 59 10/08/2017 1410   ALKPHOS 79 02/17/2017 1132   AST 20 11/07/2021 1327   AST 22 02/17/2017 1132   ALT 17 11/07/2021 1327   ALT 26 10/08/2017 1410   ALT 20 02/17/2017 1132   BILITOT 0.4 11/07/2021 1327   BILITOT 0.39 02/17/2017 1132       Impression and Plan: Ms. Erlich is a very pleasant 70 yo caucasian female with secondary polycythemia (JAK-2 negative) due to COPD. No phlebotomy needed this visit.  Follow-up in 1 month.   Lottie Dawson, NP 12/7/20222:10 PM

## 2021-11-08 LAB — IRON AND TIBC
Iron: 32 ug/dL — ABNORMAL LOW (ref 41–142)
Saturation Ratios: 6 % — ABNORMAL LOW (ref 21–57)
TIBC: 515 ug/dL — ABNORMAL HIGH (ref 236–444)
UIBC: 483 ug/dL — ABNORMAL HIGH (ref 120–384)

## 2021-11-08 LAB — FERRITIN: Ferritin: <4 ng/mL — ABNORMAL LOW (ref 11–307)

## 2021-12-07 ENCOUNTER — Inpatient Hospital Stay: Payer: Medicare HMO

## 2021-12-07 ENCOUNTER — Inpatient Hospital Stay: Payer: Medicare HMO | Attending: Hematology & Oncology

## 2021-12-07 ENCOUNTER — Inpatient Hospital Stay (HOSPITAL_BASED_OUTPATIENT_CLINIC_OR_DEPARTMENT_OTHER): Payer: Medicare HMO | Admitting: Family

## 2021-12-07 ENCOUNTER — Encounter: Payer: Self-pay | Admitting: Family

## 2021-12-07 ENCOUNTER — Other Ambulatory Visit: Payer: Self-pay

## 2021-12-07 VITALS — BP 112/57 | HR 75

## 2021-12-07 VITALS — BP 120/67 | HR 84 | Temp 97.9°F | Resp 18 | Ht 67.0 in | Wt 195.0 lb

## 2021-12-07 DIAGNOSIS — D500 Iron deficiency anemia secondary to blood loss (chronic): Secondary | ICD-10-CM | POA: Insufficient documentation

## 2021-12-07 DIAGNOSIS — D751 Secondary polycythemia: Secondary | ICD-10-CM | POA: Diagnosis not present

## 2021-12-07 DIAGNOSIS — Z7902 Long term (current) use of antithrombotics/antiplatelets: Secondary | ICD-10-CM | POA: Insufficient documentation

## 2021-12-07 DIAGNOSIS — D45 Polycythemia vera: Secondary | ICD-10-CM

## 2021-12-07 LAB — CMP (CANCER CENTER ONLY)
ALT: 14 U/L (ref 0–44)
AST: 19 U/L (ref 15–41)
Albumin: 4.4 g/dL (ref 3.5–5.0)
Alkaline Phosphatase: 68 U/L (ref 38–126)
Anion gap: 7 (ref 5–15)
BUN: 15 mg/dL (ref 8–23)
CO2: 29 mmol/L (ref 22–32)
Calcium: 9.7 mg/dL (ref 8.9–10.3)
Chloride: 106 mmol/L (ref 98–111)
Creatinine: 0.84 mg/dL (ref 0.44–1.00)
GFR, Estimated: >60 mL/min (ref >60)
Glucose, Bld: 84 mg/dL (ref 70–99)
Potassium: 3.8 mmol/L (ref 3.5–5.1)
Sodium: 142 mmol/L (ref 135–145)
Total Bilirubin: 0.5 mg/dL (ref 0.3–1.2)
Total Protein: 6.6 g/dL (ref 6.5–8.1)

## 2021-12-07 LAB — CBC WITH DIFFERENTIAL (CANCER CENTER ONLY)
Abs Immature Granulocytes: 0.01 10*3/uL (ref 0.00–0.07)
Basophils Absolute: 0 10*3/uL (ref 0.0–0.1)
Basophils Relative: 1 %
Eosinophils Absolute: 0.1 10*3/uL (ref 0.0–0.5)
Eosinophils Relative: 2 %
HCT: 39.7 % (ref 36.0–46.0)
Hemoglobin: 11.9 g/dL — ABNORMAL LOW (ref 12.0–15.0)
Immature Granulocytes: 0 %
Lymphocytes Relative: 24 %
Lymphs Abs: 1.3 10*3/uL (ref 0.7–4.0)
MCH: 22 pg — ABNORMAL LOW (ref 26.0–34.0)
MCHC: 30 g/dL (ref 30.0–36.0)
MCV: 73.4 fL — ABNORMAL LOW (ref 80.0–100.0)
Monocytes Absolute: 0.5 10*3/uL (ref 0.1–1.0)
Monocytes Relative: 9 %
Neutro Abs: 3.3 10*3/uL (ref 1.7–7.7)
Neutrophils Relative %: 64 %
Platelet Count: 266 10*3/uL (ref 150–400)
RBC: 5.41 MIL/uL — ABNORMAL HIGH (ref 3.87–5.11)
RDW: 17.4 % — ABNORMAL HIGH (ref 11.5–15.5)
WBC Count: 5.2 10*3/uL (ref 4.0–10.5)
nRBC: 0 % (ref 0.0–0.2)

## 2021-12-07 MED ORDER — SODIUM CHLORIDE 0.9 % IV SOLN: Freq: Once | INTRAVENOUS | Status: AC

## 2021-12-07 NOTE — Progress Notes (Signed)
Hematology and Oncology Follow Up Visit  Natalie Hendrix [id number] 1951-11-03 71 y.o. 12/07/2021   Principle Diagnosis:  Secondary polycythemia - JAK2 negative   Current Therapy:        Phlebotomy to maintain hematocrit less than 38% Plavix 75 mg by mouth daily   Interim History:  Natalie Hendrix is here today for follow-up. She is doing fairly well but notes increased fatigue, itching and headaches.  No falls or syncope to report.  Hct is 39.7%. She is resting as needed.  She has been enjoying working a little outside in her yard on warmer days.  Her SOB is stable on supplemental O2. She is currently on 2L [place].  No fever, chills, n/v, cough, rash, dizziness, chest pain, palpitations, abdominal pain or changes in bowel or bladder habits at this time.  No blood loss noted. She has some bruising on her arms with thinning skin. No petechiae.  She has no appetite but is drinking 1-2 protein drinks a day. She is doing her best to stay well hydrated. Her weight is 195 lbs.   ECOG Performance Status: 1 - Symptomatic but completely ambulatory  Medications:  Allergies as of 12/07/2021       Reactions   Epinephrine Palpitations   Raises heart rate   Flonase [fluticasone] Palpitations   Protriptyline Hcl Other (See Comments)   Severe constipation   Tamiflu [oseltamivir Phosphate] Other (See Comments)   "like I was having a stroke"   Tamiflu [oseltamivir] Anaphylaxis   Tizanidine Other (See Comments)   panic   Gabapentin Other (See Comments)   arthralgia Other reaction(s): Arthralgia (Joint Pain)   Other Hives, Rash   boiron acteane   Qvar [beclomethasone] Other (See Comments)   Spiriva [tiotropium Bromide Monohydrate] Rash   Tiotropium Rash   Zonegran Other (See Comments)   Mood swings   Advair Diskus [fluticasone-salmeterol] Rash   Baclofen Rash   Chlorzoxazone Rash   Lamotrigine Rash   Levofloxacin Rash   Protriptyline Rash   Ropinirole Rash   Tiotropium Bromide Monohydrate Rash    Verapamil Rash        Medication List        Accurate as of December 07, 2021  1:35 PM. If you have any questions, ask your nurse or doctor.          albuterol 108 (90 Base) MCG/ACT inhaler Commonly known as: ProAir HFA Inhale 2 puffs into the lungs every 6 (six) hours as needed for wheezing or shortness of breath.   albuterol (2.5 MG/3ML) 0.083% nebulizer solution Commonly known as: PROVENTIL Take 3 mLs (2.5 mg total) by nebulization every 6 (six) hours as needed for wheezing or shortness of breath.   azelastine 0.1 % nasal spray Commonly known as: ASTELIN Place 1 spray into both nostrils 2 (two) times daily.   beclomethasone 42 MCG/SPRAY nasal spray Commonly known as: BECONASE-AQ 2 puffs each nostril once daily   benzonatate 100 MG capsule Commonly known as: TESSALON Take 1 capsule (100 mg total) by mouth every 6 (six) hours as needed for cough.   buPROPion 300 MG 24 hr tablet Commonly known as: WELLBUTRIN XL Take 1 tablet (300 mg total) by mouth daily with breakfast.   CALCIUM 1200 PO Take 1,250 mg by mouth daily.   Centrum Silver tablet Take 1 tablet by mouth daily.   clopidogrel 75 MG tablet Commonly known as: PLAVIX Take 1 tablet (75 mg total) by mouth daily.   cyclobenzaprine 10 MG tablet Commonly known as:  FLEXERIL Take 10 mg by mouth every 8 (eight) hours as needed.   doxycycline 100 MG capsule Commonly known as: VIBRAMYCIN Take 1 capsule (100 mg total) by mouth 2 (two) times daily.   ESTRACE VAGINAL 0.1 MG/GM vaginal cream Generic drug: estradiol Place 1 Applicatorful vaginally daily. Small amount each dose per pt   ezetimibe 10 MG tablet Commonly known as: Zetia TAKE 1 TABLET BY MOUTH DAILY.   fexofenadine 180 MG tablet Commonly known as: Allegra Allergy Take 1 tablet (180 mg total) by mouth daily.   Fish Oil 1200 MG Caps Take 1,200 mg by mouth at bedtime.   Fluad Quadrivalent 0.5 ML injection Generic drug: influenza vaccine  adjuvanted Inject into the muscle.   hydrOXYzine 10 MG tablet Commonly known as: ATARAX Take 10 mg by mouth 3 (three) times daily as needed.   hyoscyamine 0.125 MG Tbdp disintergrating tablet Commonly known as: ANASPAZ Place 1 tablet (0.125 mg total) under the tongue 2 (two) times daily as needed. What changed: reasons to take this   lidocaine 5 % Commonly known as: LIDODERM Place 1 patch onto the skin as needed (pain). Remove & Discard patch within 12 hours or as directed by MD   LORazepam 1 MG tablet Commonly known as: ATIVAN Take 1 tablet (1 mg total) by mouth 2 (two) times daily. May take extra 3rd dose if needed What changed:  when to take this reasons to take this   magnesium gluconate 500 MG tablet Commonly known as: MAGONATE Take 500 mg by mouth daily as needed (constipation).   meclizine 25 MG tablet Commonly known as: ANTIVERT Take 1 tablet (25 mg total) by mouth 3 (three) times daily as needed for dizziness. 3 month supply What changed: when to take this   metoprolol succinate 50 MG 24 hr tablet Commonly known as: TOPROL-XL Take 1 tablet (50 mg total) by mouth daily with breakfast. What changed: how much to take   montelukast 10 MG tablet Commonly known as: SINGULAIR TAKE ONE TABLET BY MOUTH EVERY DAY   morphine 15 MG tablet Commonly known as: MSIR Take 15 mg by mouth every 4 (four) hours as needed for severe pain.   morphine 30 MG 12 hr tablet Commonly known as: MS CONTIN Take 1 tablet by mouth every 12 (twelve) hours.   Nebulizer Devi Use as directed   OXYGEN Inhale 2.5 L/hr into the lungs continuous.   pantoprazole 40 MG tablet Commonly known as: PROTONIX TAKE 1 TABLET (40 MG TOTAL) BY MOUTH DAILY.   Polyethyl Glycol-Propyl Glycol 0.4-0.3 % Soln Apply 1 drop to eye daily as needed (dry eyes).   pravastatin 40 MG tablet Commonly known as: PRAVACHOL Take 1 tablet (40 mg total) by mouth at bedtime.   promethazine 25 MG tablet Commonly  known as: PHENERGAN Take 25 mg by mouth every 8 (eight) hours as needed for nausea or vomiting.   TART CHERRY ADVANCED PO Take 5 mLs by mouth daily. Juice Concentrate- 1tsp daily   Zovirax 5 % Generic drug: acyclovir ointment Apply 1 application topically as needed (flair).        Allergies:  Allergies  Allergen Reactions   Epinephrine Palpitations    Raises heart rate    Flonase [Fluticasone] Palpitations   Protriptyline Hcl Other (See Comments)    Severe constipation   Tamiflu [Oseltamivir Phosphate] Other (See Comments)    "like I was having a stroke"   Tamiflu [Oseltamivir] Anaphylaxis   Tizanidine Other (See Comments)    panic  Gabapentin Other (See Comments)    arthralgia Other reaction(s): Arthralgia (Joint Pain)   Other Hives and Rash    boiron acteane   Qvar [Beclomethasone] Other (See Comments)   Spiriva [Tiotropium Bromide Monohydrate] Rash   Tiotropium Rash   Zonegran Other (See Comments)    Mood swings   Advair Diskus [Fluticasone-Salmeterol] Rash   Baclofen Rash   Chlorzoxazone Rash   Lamotrigine Rash   Levofloxacin Rash   Protriptyline Rash   Ropinirole Rash   Tiotropium Bromide Monohydrate Rash   Verapamil Rash    Past Medical History, Surgical history, Social history, and Family History were reviewed and updated.  Review of Systems: All other 10 point review of systems is negative.   Physical Exam:  vitals were not taken for this visit.   Wt Readings from Last 3 Encounters:  11/07/21 201 lb 0.6 oz (91.2 kg)  09/20/21 199 lb 6.4 oz (90.4 kg)  08/13/21 194 lb 1.9 oz (88.1 kg)    Ocular: Sclerae unicteric, pupils equal, round and reactive to light Ear-nose-throat: Oropharynx clear, dentition fair Lymphatic: No cervical or supraclavicular adenopathy Lungs no rales or rhonchi, good excursion bilaterally Heart regular rate and rhythm, no murmur appreciated Abd soft, nontender, positive bowel sounds MSK no focal spinal tenderness, no  joint edema Neuro: non-focal, well-oriented, appropriate affect Breasts: Deferred   Lab Results  Component Value Date   WBC 5.9 11/07/2021   HGB 11.1 (L) 11/07/2021   HCT 37.4 11/07/2021   MCV 74.7 (L) 11/07/2021   PLT 270 11/07/2021   Lab Results  Component Value Date   FERRITIN <4 (L) 11/07/2021   IRON 32 (L) 11/07/2021   TIBC 515 (H) 11/07/2021   UIBC 483 (H) 11/07/2021   IRONPCTSAT 6 (L) 11/07/2021   Lab Results  Component Value Date   RETICCTPCT 1.1 04/27/2015   RBC 5.01 11/07/2021   RETICCTABS 55.1 04/27/2015   No results found for: KPAFRELGTCHN, LAMBDASER, KAPLAMBRATIO No results found for: IGGSERUM, IGA, IGMSERUM No results found for: Odetta Pink, SPEI   Chemistry      Component Value Date/Time   NA 141 11/07/2021 1327   NA 147 (H) 10/08/2017 1410   NA 141 02/17/2017 1132   K 4.9 11/07/2021 1327   K 4.6 10/08/2017 1410   K 5.3 (H) 02/17/2017 1132   CL 105 11/07/2021 1327   CL 106 10/08/2017 1410   CO2 31 11/07/2021 1327   CO2 30 10/08/2017 1410   CO2 29 02/17/2017 1132   BUN 19 11/07/2021 1327   BUN 15 10/08/2017 1410   BUN 17.8 02/17/2017 1132   CREATININE 0.77 11/07/2021 1327   CREATININE 1.0 10/08/2017 1410   CREATININE 0.8 02/17/2017 1132      Component Value Date/Time   CALCIUM 9.6 11/07/2021 1327   CALCIUM 9.0 10/08/2017 1410   CALCIUM 9.3 02/17/2017 1132   ALKPHOS 58 11/07/2021 1327   ALKPHOS 59 10/08/2017 1410   ALKPHOS 79 02/17/2017 1132   AST 20 11/07/2021 1327   AST 22 02/17/2017 1132   ALT 17 11/07/2021 1327   ALT 26 10/08/2017 1410   ALT 20 02/17/2017 1132   BILITOT 0.4 11/07/2021 1327   BILITOT 0.39 02/17/2017 1132       Impression and Plan: Ms. Winfield is a very pleasant 71 yo caucasian female with secondary polycythemia (JAK-2 negative) due to COPD. Phlebotomy and replacement fluids today for Hct 39.7%.  Follow-up in 6 weeks.   Lottie Dawson, NP  1/6/20231:35 PM

## 2021-12-07 NOTE — Patient Instructions (Signed)
Therapeutic Phlebotomy °Therapeutic phlebotomy is the planned removal of blood from a person's body for the purpose of treating a medical condition. The procedure is lot like donating blood. Usually, about a pint (470 mL, or 0.47 L) of blood is removed. The average adult has 9-12 pints (4.3-5.7 L) of blood in his or her body. °Therapeutic phlebotomy may be used to treat the following medical conditions: °Hemochromatosis. This is a condition in which the blood contains too much iron. °Polycythemia vera. This is a condition in which the blood contains too many red blood cells. °Porphyria cutanea tarda. This is a disease in which an important part of hemoglobin is not made properly. It results in the buildup of abnormal amounts of porphyrins in the body. °Sickle cell disease. This is a condition in which the red blood cells form an abnormal crescent shape rather than a round shape. °Tell a health care provider about: °Any allergies you have. °All medicines you are taking, including vitamins, herbs, eye drops, creams, and over-the-counter medicines. °Any bleeding problems you have. °Any surgeries you have had. °Any medical conditions you have. °Whether you are pregnant or may be pregnant. °What are the risks? °Generally, this is a safe procedure. However, problems may occur, including: °Nausea or light-headedness. °Low blood pressure (hypotension). °Soreness, bleeding, swelling, or bruising at the needle insertion site. °Infection. °What happens before the procedure? °Ask your health care provider about: °Changing or stopping your regular medicines. This is especially important if you are taking diabetes medicines or blood thinners. °Taking medicines such as aspirin and ibuprofen. These medicines can thin your blood. Do not take these medicines unless your health care provider tells you to take them. °Taking over-the-counter medicines, vitamins, herbs, and supplements. °Wear clothing with sleeves that can be raised  above the elbow. °You may have a blood sample taken. °Your blood pressure, pulse rate, and breathing rate will be measured. °What happens during the procedure? ° °You may be given a medicine to numb the area (local anesthetic). °A tourniquet will be placed on your arm. °A needle will be put into one of your veins. °Tubing and a collection bag will be attached to the needle. °Blood will flow through the needle and tubing into the collection bag. °The collection bag will be placed lower than your arm so gravity can help the blood flow into the bag. °You may be asked to open and close your hand slowly and continually during the entire collection. °After the specified amount of blood has been removed from your body, the collection bag and tubing will be clamped. °The needle will be removed from your vein. °Pressure will be held on the needle site to stop the bleeding. °A bandage (dressing) will be placed over the needle insertion site. °The procedure may vary among health care providers and hospitals. °What happens after the procedure? °Your blood pressure, pulse rate, and breathing rate will be measured after the procedure. °You will be encouraged to drink fluids. °You will be encouraged to eat a snack to prevent a low blood sugar level. °Your recovery will be assessed and monitored. °Return to your normal activities as told by your health care provider. °Summary °Therapeutic phlebotomy is the planned removal of blood from a person's body for the purpose of treating a medical condition. °Therapeutic phlebotomy may be used to treat hemochromatosis, polycythemia vera, porphyria cutanea tarda, or sickle cell disease. °In the procedure, a needle is inserted and about a pint (470 mL, or 0.47 L) of blood is   removed. The average adult has 9-12 pints (4.3-5.7 L) of blood in the body. °This is generally a safe procedure, but it can sometimes cause problems such as nausea, light-headedness, or low blood pressure  (hypotension). °This information is not intended to replace advice given to you by your health care provider. Make sure you discuss any questions you have with your health care provider. °Document Revised: 05/16/2021 Document Reviewed: 05/16/2021 °Elsevier Patient Education © 2022 [employer] ° °

## 2021-12-07 NOTE — Progress Notes (Signed)
Natalie Hendrix presents today for phlebotomy per MD orders. Phlebotomy procedure started at 1419 and ended at 1433. 525 grams removed via 20 gauge to left AC. Patient observed for 30 minutes after procedure without any incident. Patient tolerated procedure well. IV needle removed intact.

## 2021-12-10 LAB — IRON AND IRON BINDING CAPACITY (CC-WL,HP ONLY)
Iron: 49 ug/dL (ref 28–170)
Saturation Ratios: 9 % — ABNORMAL LOW (ref 10.4–31.8)
TIBC: 550 ug/dL — ABNORMAL HIGH (ref 250–450)
UIBC: 501 ug/dL — ABNORMAL HIGH (ref 148–442)

## 2021-12-10 LAB — FERRITIN: Ferritin: 4 ng/mL — ABNORMAL LOW (ref 11–307)

## 2021-12-14 ENCOUNTER — Telehealth: Payer: Self-pay | Admitting: Internal Medicine

## 2021-12-14 NOTE — Telephone Encounter (Signed)
I would start with simple otc artificial tears, like Systane or other brands.

## 2021-12-14 NOTE — Telephone Encounter (Signed)
Patient is having problems with her eyes, they are itching. This starts around 1pm everyday and goes on until she goes to bed. she thinks it's her allergies.She doen't have much energy. Wants to know if there is anything CY can give her for this. Please advise

## 2021-12-14 NOTE — Telephone Encounter (Signed)
Called and spoke with patient.  Patient stated she has noticed her eyes are dry and itchy.  Patient stated it feels like allergy and was advised by Hattie Perch to use biotene for dry mouth and OTC eye drops. Patient stated she has been using OTC eye drops, biotene, Allegra every morning, Singulair sometimes at bedtime. Patient requested recommendations from Dr. Annamaria Boots and wanted to know if he felt a prescription eye drop may be better for her? Patient is aware Dr. Annamaria Boots has left for the day. Patient stated she would continue her dry eye routine for now.  Message routed to Dr. Annamaria Boots to advise  Allergies  Allergen Reactions   Epinephrine Palpitations    Raises heart rate    Flonase [Fluticasone] Palpitations   Protriptyline Hcl Other (See Comments)    Severe constipation   Tamiflu [Oseltamivir Phosphate] Other (See Comments)    "like I was having a stroke"   Tamiflu [Oseltamivir] Anaphylaxis   Tizanidine Other (See Comments)    panic   Gabapentin Other (See Comments)    arthralgia Other reaction(s): Arthralgia (Joint Pain)   Other Hives and Rash    boiron acteane   Qvar [Beclomethasone] Other (See Comments)   Spiriva [Tiotropium Bromide Monohydrate] Rash   Tiotropium Rash   Zonegran Other (See Comments)    Mood swings   Advair Diskus [Fluticasone-Salmeterol] Rash   Baclofen Rash   Chlorzoxazone Rash   Lamotrigine Rash   Levofloxacin Rash   Protriptyline Rash   Ropinirole Rash   Tiotropium Bromide Monohydrate Rash   Verapamil Rash   Current Outpatient Medications on File Prior to Visit  Medication Sig Dispense Refill   albuterol (PROAIR HFA) 108 (90 Base) MCG/ACT inhaler Inhale 2 puffs into the lungs every 6 (six) hours as needed for wheezing or shortness of breath. 8 g 5   albuterol (PROVENTIL) (2.5 MG/3ML) 0.083% nebulizer solution Take 3 mLs (2.5 mg total) by nebulization every 6 (six) hours as needed for wheezing or shortness of breath. 1080 mL 2   azelastine (ASTELIN)  0.1 % nasal spray Place 1 spray into both nostrils 2 (two) times daily. 90 mL 3   beclomethasone (BECONASE-AQ) 42 MCG/SPRAY nasal spray 2 puffs each nostril once daily 75 g 3   benzonatate (TESSALON) 100 MG capsule Take 1 capsule (100 mg total) by mouth every 6 (six) hours as needed for cough. 360 capsule 3   buPROPion (WELLBUTRIN XL) 300 MG 24 hr tablet Take 1 tablet (300 mg total) by mouth daily with breakfast. 90 tablet 3   Calcium Carbonate-Vit D-Min (CALCIUM 1200 PO) Take 1,250 mg by mouth daily.      clopidogrel (PLAVIX) 75 MG tablet Take 1 tablet (75 mg total) by mouth daily. 90 tablet 1   cyclobenzaprine (FLEXERIL) 10 MG tablet Take 10 mg by mouth every 8 (eight) hours as needed.     ESTRACE VAGINAL 0.1 MG/GM vaginal cream Place 1 Applicatorful vaginally daily. Small amount each dose per pt     ezetimibe (ZETIA) 10 MG tablet TAKE 1 TABLET BY MOUTH DAILY. 90 tablet 3   fexofenadine (ALLEGRA ALLERGY) 180 MG tablet Take 1 tablet (180 mg total) by mouth daily. 90 tablet 1   hydrOXYzine (ATARAX/VISTARIL) 10 MG tablet Take 10 mg by mouth 3 (three) times daily as needed.     hyoscyamine (ANASPAZ) 0.125 MG TBDP disintergrating tablet Place 1 tablet (0.125 mg total) under the tongue 2 (two) times daily as needed. (Patient taking differently: Place 0.125 mg under the  tongue 2 (two) times daily as needed for bladder spasms or cramping.) 180 tablet 0   lidocaine (LIDODERM) 5 % Place 1 patch onto the skin as needed (pain). Remove & Discard patch within 12 hours or as directed by MD     LORazepam (ATIVAN) 1 MG tablet Take 1 tablet (1 mg total) by mouth 2 (two) times daily. May take extra 3rd dose if needed (Patient taking differently: Take 1 mg by mouth daily as needed. May take extra 3rd dose if needed) 75 tablet 0   magnesium gluconate (MAGONATE) 500 MG tablet Take 500 mg by mouth daily as needed (constipation).     meclizine (ANTIVERT) 25 MG tablet Take 1 tablet (25 mg total) by mouth 3 (three) times  daily as needed for dizziness. 3 month supply (Patient taking differently: Take 25 mg by mouth 3 (three) times daily. 3 month supply) 60 tablet 0   metoprolol succinate (TOPROL-XL) 50 MG 24 hr tablet Take 1 tablet (50 mg total) by mouth daily with breakfast. (Patient taking differently: Take 75 mg by mouth daily with breakfast.) 90 tablet 1   Misc Natural Products (TART CHERRY ADVANCED PO) Take 5 mLs by mouth daily. Juice Concentrate- 1tsp daily     montelukast (SINGULAIR) 10 MG tablet TAKE ONE TABLET BY MOUTH EVERY DAY 90 tablet 3   morphine (MS CONTIN) 30 MG 12 hr tablet Take 1 tablet by mouth every 12 (twelve) hours.     morphine (MSIR) 15 MG tablet Take 15 mg by mouth every 4 (four) hours as needed for severe pain.     Multiple Vitamins-Minerals (CENTRUM SILVER) tablet Take 1 tablet by mouth daily.     Omega-3 Fatty Acids (FISH OIL) 1200 MG CAPS Take 1,200 mg by mouth at bedtime.      OXYGEN Inhale 2.5 L/hr into the lungs continuous.     pantoprazole (PROTONIX) 40 MG tablet TAKE 1 TABLET (40 MG TOTAL) BY MOUTH DAILY. 90 tablet 3   Polyethyl Glycol-Propyl Glycol 0.4-0.3 % SOLN Apply 1 drop to eye daily as needed (dry eyes).      pravastatin (PRAVACHOL) 40 MG tablet Take 1 tablet (40 mg total) by mouth at bedtime. 90 tablet 3   promethazine (PHENERGAN) 25 MG tablet Take 25 mg by mouth every 8 (eight) hours as needed for nausea or vomiting.   1   Respiratory Therapy Supplies (NEBULIZER) DEVI Use as directed 1 each 0   ZOVIRAX 5 % Apply 1 application topically as needed (flair).      Current Facility-Administered Medications on File Prior to Visit  Medication Dose Route Frequency Provider Last Rate Last Admin   0.9 %  sodium chloride infusion  1,000 mL Intravenous Once Celso Amy, NP

## 2021-12-17 NOTE — Telephone Encounter (Signed)
Called and spoke with pt's spouse letting him know the recs per CY and he said that pt has already tried those eye drops. Asked if pt had an eye doctor that she sees and he said that she does so I suggested they call her eye doctor for further recommendations and he verbalized understanding. Nothing further needed.

## 2022-01-15 ENCOUNTER — Inpatient Hospital Stay (HOSPITAL_BASED_OUTPATIENT_CLINIC_OR_DEPARTMENT_OTHER): Payer: Medicare HMO | Admitting: Family

## 2022-01-15 ENCOUNTER — Inpatient Hospital Stay: Payer: Medicare HMO | Attending: Hematology & Oncology

## 2022-01-15 ENCOUNTER — Inpatient Hospital Stay: Payer: Medicare HMO

## 2022-01-15 ENCOUNTER — Encounter: Payer: Self-pay | Admitting: Family

## 2022-01-15 ENCOUNTER — Other Ambulatory Visit: Payer: Self-pay

## 2022-01-15 VITALS — BP 112/43 | HR 76 | Temp 97.6°F | Resp 20 | Ht 67.0 in | Wt 199.0 lb

## 2022-01-15 DIAGNOSIS — D751 Secondary polycythemia: Secondary | ICD-10-CM | POA: Diagnosis not present

## 2022-01-15 DIAGNOSIS — D500 Iron deficiency anemia secondary to blood loss (chronic): Secondary | ICD-10-CM | POA: Insufficient documentation

## 2022-01-15 DIAGNOSIS — Z9981 Dependence on supplemental oxygen: Secondary | ICD-10-CM | POA: Insufficient documentation

## 2022-01-15 DIAGNOSIS — R0602 Shortness of breath: Secondary | ICD-10-CM | POA: Insufficient documentation

## 2022-01-15 DIAGNOSIS — R5383 Other fatigue: Secondary | ICD-10-CM | POA: Diagnosis not present

## 2022-01-15 LAB — CMP (CANCER CENTER ONLY)
ALT: 15 U/L (ref 0–44)
AST: 22 U/L (ref 15–41)
Albumin: 3.7 g/dL (ref 3.5–5.0)
Alkaline Phosphatase: 52 U/L (ref 38–126)
Anion gap: 5 (ref 5–15)
BUN: 15 mg/dL (ref 8–23)
CO2: 30 mmol/L (ref 22–32)
Calcium: 8.6 mg/dL — ABNORMAL LOW (ref 8.9–10.3)
Chloride: 103 mmol/L (ref 98–111)
Creatinine: 0.73 mg/dL (ref 0.44–1.00)
GFR, Estimated: >60 mL/min (ref >60)
Glucose, Bld: 98 mg/dL (ref 70–99)
Potassium: 4.5 mmol/L (ref 3.5–5.1)
Sodium: 138 mmol/L (ref 135–145)
Total Bilirubin: 0.4 mg/dL (ref 0.3–1.2)
Total Protein: 6.2 g/dL — ABNORMAL LOW (ref 6.5–8.1)

## 2022-01-15 LAB — CBC WITH DIFFERENTIAL (CANCER CENTER ONLY)
Abs Immature Granulocytes: 0.02 10*3/uL (ref 0.00–0.07)
Basophils Absolute: 0 10*3/uL (ref 0.0–0.1)
Basophils Relative: 1 %
Eosinophils Absolute: 0.1 10*3/uL (ref 0.0–0.5)
Eosinophils Relative: 2 %
HCT: 34.5 % — ABNORMAL LOW (ref 36.0–46.0)
Hemoglobin: 10.2 g/dL — ABNORMAL LOW (ref 12.0–15.0)
Immature Granulocytes: 0 %
Lymphocytes Relative: 23 %
Lymphs Abs: 1.3 10*3/uL (ref 0.7–4.0)
MCH: 21.4 pg — ABNORMAL LOW (ref 26.0–34.0)
MCHC: 29.6 g/dL — ABNORMAL LOW (ref 30.0–36.0)
MCV: 72.3 fL — ABNORMAL LOW (ref 80.0–100.0)
Monocytes Absolute: 0.5 10*3/uL (ref 0.1–1.0)
Monocytes Relative: 9 %
Neutro Abs: 3.7 10*3/uL (ref 1.7–7.7)
Neutrophils Relative %: 65 %
Platelet Count: 296 10*3/uL (ref 150–400)
RBC: 4.77 MIL/uL (ref 3.87–5.11)
RDW: 17.2 % — ABNORMAL HIGH (ref 11.5–15.5)
WBC Count: 5.7 10*3/uL (ref 4.0–10.5)
nRBC: 0 % (ref 0.0–0.2)

## 2022-01-15 NOTE — Progress Notes (Signed)
Hematology and Oncology Follow Up Visit  Natalie Hendrix [id number] Sep 22, 1951 71 y.o. 01/15/2022   Principle Diagnosis:  Secondary polycythemia - JAK2 negative   Current Therapy:        Phlebotomy to maintain hematocrit less than 38% Plavix 75 mg by mouth daily   Interim History:  Natalie Hendrix is here today with her husband for follow-up. She is doing well but notes some mild fatigue.  Her SOB is stable at 2-3 L supplemental O2 24 hours a day.  No fever, chills, n/v, cough, rash, dizziness, chest pain, palpitations, abdominal pain or changes in bowel or bladder habits.  Neuropathy unchanged from baseline.  She states that she did recently trip and fall while blowing leaves outside. Thankfully she was not seriously injured.  No syncope.  She has maintained a good appetite and is staying well hydrated. Her weight is stable at 199 lbs.   ECOG Performance Status: 1 - Symptomatic but completely ambulatory  Medications:  Allergies as of 01/15/2022       Reactions   Epinephrine Palpitations   Raises heart rate   Flonase [fluticasone] Palpitations   Protriptyline Hcl Other (See Comments)   Severe constipation   Tamiflu [oseltamivir Phosphate] Other (See Comments)   "like I was having a stroke"   Tamiflu [oseltamivir] Anaphylaxis   Tizanidine Other (See Comments)   panic   Gabapentin Other (See Comments)   arthralgia Other reaction(s): Arthralgia (Joint Pain)   Other Hives, Rash   boiron acteane   Qvar [beclomethasone] Other (See Comments)   Spiriva [tiotropium Bromide Monohydrate] Rash   Tiotropium Rash   Zonegran Other (See Comments)   Mood swings   Advair Diskus [fluticasone-salmeterol] Rash   Baclofen Rash   Chlorzoxazone Rash   Lamotrigine Rash   Levofloxacin Rash   Protriptyline Rash   Ropinirole Rash   Tiotropium Bromide Monohydrate Rash   Verapamil Rash        Medication List        Accurate as of January 15, 2022  1:56 PM. If you have any questions, ask  your nurse or doctor.          albuterol 108 (90 Base) MCG/ACT inhaler Commonly known as: ProAir HFA Inhale 2 puffs into the lungs every 6 (six) hours as needed for wheezing or shortness of breath.   albuterol (2.5 MG/3ML) 0.083% nebulizer solution Commonly known as: PROVENTIL Take 3 mLs (2.5 mg total) by nebulization every 6 (six) hours as needed for wheezing or shortness of breath.   azelastine 0.1 % nasal spray Commonly known as: ASTELIN Place 1 spray into both nostrils 2 (two) times daily.   beclomethasone 42 MCG/SPRAY nasal spray Commonly known as: BECONASE-AQ 2 puffs each nostril once daily   benzonatate 100 MG capsule Commonly known as: TESSALON Take 1 capsule (100 mg total) by mouth every 6 (six) hours as needed for cough.   buPROPion 300 MG 24 hr tablet Commonly known as: WELLBUTRIN XL Take 1 tablet (300 mg total) by mouth daily with breakfast.   CALCIUM 1200 PO Take 1,250 mg by mouth daily.   Centrum Silver tablet Take 1 tablet by mouth daily.   clopidogrel 75 MG tablet Commonly known as: PLAVIX Take 1 tablet (75 mg total) by mouth daily.   cyclobenzaprine 10 MG tablet Commonly known as: FLEXERIL Take 10 mg by mouth every 8 (eight) hours as needed.   ESTRACE VAGINAL 0.1 MG/GM vaginal cream Generic drug: estradiol Place 1 Applicatorful vaginally daily. Small amount each  dose per pt   ezetimibe 10 MG tablet Commonly known as: Zetia TAKE 1 TABLET BY MOUTH DAILY.   fexofenadine 180 MG tablet Commonly known as: Allegra Allergy Take 1 tablet (180 mg total) by mouth daily.   Fish Oil 1200 MG Caps Take 1,200 mg by mouth at bedtime.   hydrOXYzine 10 MG tablet Commonly known as: ATARAX Take 10 mg by mouth 3 (three) times daily as needed.   hyoscyamine 0.125 MG Tbdp disintergrating tablet Commonly known as: ANASPAZ Place 1 tablet (0.125 mg total) under the tongue 2 (two) times daily as needed. What changed: reasons to take this   lidocaine 5  % Commonly known as: LIDODERM Place 1 patch onto the skin as needed (pain). Remove & Discard patch within 12 hours or as directed by MD   LORazepam 1 MG tablet Commonly known as: ATIVAN Take 1 tablet (1 mg total) by mouth 2 (two) times daily. May take extra 3rd dose if needed What changed:  when to take this reasons to take this   magnesium gluconate 500 MG tablet Commonly known as: MAGONATE Take 500 mg by mouth daily as needed (constipation).   meclizine 25 MG tablet Commonly known as: ANTIVERT Take 1 tablet (25 mg total) by mouth 3 (three) times daily as needed for dizziness. 3 month supply What changed: when to take this   metoprolol succinate 50 MG 24 hr tablet Commonly known as: TOPROL-XL Take 1 tablet (50 mg total) by mouth daily with breakfast. What changed: how much to take   montelukast 10 MG tablet Commonly known as: SINGULAIR TAKE ONE TABLET BY MOUTH EVERY DAY   morphine 15 MG tablet Commonly known as: MSIR Take 15 mg by mouth every 4 (four) hours as needed for severe pain.   morphine 30 MG 12 hr tablet Commonly known as: MS CONTIN Take 1 tablet by mouth every 12 (twelve) hours.   Nebulizer Devi Use as directed   OXYGEN Inhale 2.5 L/hr into the lungs continuous.   pantoprazole 40 MG tablet Commonly known as: PROTONIX TAKE 1 TABLET (40 MG TOTAL) BY MOUTH DAILY.   Polyethyl Glycol-Propyl Glycol 0.4-0.3 % Soln Apply 1 drop to eye daily as needed (dry eyes).   pravastatin 40 MG tablet Commonly known as: PRAVACHOL Take 1 tablet (40 mg total) by mouth at bedtime.   promethazine 25 MG tablet Commonly known as: PHENERGAN Take 25 mg by mouth every 8 (eight) hours as needed for nausea or vomiting.   TART CHERRY ADVANCED PO Take 5 mLs by mouth daily. Juice Concentrate- 1tsp daily   Zovirax 5 % Generic drug: acyclovir ointment Apply 1 application topically as needed (flair).        Allergies:  Allergies  Allergen Reactions   Epinephrine  Palpitations    Raises heart rate    Flonase [Fluticasone] Palpitations   Protriptyline Hcl Other (See Comments)    Severe constipation   Tamiflu [Oseltamivir Phosphate] Other (See Comments)    "like I was having a stroke"   Tamiflu [Oseltamivir] Anaphylaxis   Tizanidine Other (See Comments)    panic   Gabapentin Other (See Comments)    arthralgia Other reaction(s): Arthralgia (Joint Pain)   Other Hives and Rash    boiron acteane   Qvar [Beclomethasone] Other (See Comments)   Spiriva [Tiotropium Bromide Monohydrate] Rash   Tiotropium Rash   Zonegran Other (See Comments)    Mood swings   Advair Diskus [Fluticasone-Salmeterol] Rash   Baclofen Rash   Chlorzoxazone Rash  Lamotrigine Rash   Levofloxacin Rash   Protriptyline Rash   Ropinirole Rash   Tiotropium Bromide Monohydrate Rash   Verapamil Rash    Past Medical History, Surgical history, Social history, and Family History were reviewed and updated.  Review of Systems: All other 10 point review of systems is negative.   Physical Exam:  height is 5\' 7"  (1.702 m) and weight is 199 lb (90.3 kg). Her oral temperature is 97.6 F (36.4 C). Her blood pressure is 112/43 (abnormal) and her pulse is 76. Her respiration is 20 and oxygen saturation is 100%.   Wt Readings from Last 3 Encounters:  01/15/22 199 lb (90.3 kg)  12/07/21 195 lb (88.5 kg)  11/07/21 201 lb 0.6 oz (91.2 kg)    Ocular: Sclerae unicteric, pupils equal, round and reactive to light Ear-nose-throat: Oropharynx clear, dentition fair Lymphatic: No cervical or supraclavicular adenopathy Lungs no rales or rhonchi, good excursion bilaterally Heart regular rate and rhythm, no murmur appreciated Abd soft, nontender, positive bowel sounds MSK no focal spinal tenderness, no joint edema Neuro: non-focal, well-oriented, appropriate affect Breasts: Deferred   Lab Results  Component Value Date   WBC 5.7 01/15/2022   HGB 10.2 (L) 01/15/2022   HCT 34.5 (L)  01/15/2022   MCV 72.3 (L) 01/15/2022   PLT 296 01/15/2022   Lab Results  Component Value Date   FERRITIN 4 (L) 12/07/2021   IRON 49 12/07/2021   TIBC 550 (H) 12/07/2021   UIBC 501 (H) 12/07/2021   IRONPCTSAT 9 (L) 12/07/2021   Lab Results  Component Value Date   RETICCTPCT 1.1 04/27/2015   RBC 4.77 01/15/2022   RETICCTABS 55.1 04/27/2015   No results found for: KPAFRELGTCHN, LAMBDASER, KAPLAMBRATIO No results found for: IGGSERUM, IGA, IGMSERUM No results found for: Odetta Pink, SPEI   Chemistry      Component Value Date/Time   NA 142 12/07/2021 1327   NA 147 (H) 10/08/2017 1410   NA 141 02/17/2017 1132   K 3.8 12/07/2021 1327   K 4.6 10/08/2017 1410   K 5.3 (H) 02/17/2017 1132   CL 106 12/07/2021 1327   CL 106 10/08/2017 1410   CO2 29 12/07/2021 1327   CO2 30 10/08/2017 1410   CO2 29 02/17/2017 1132   BUN 15 12/07/2021 1327   BUN 15 10/08/2017 1410   BUN 17.8 02/17/2017 1132   CREATININE 0.84 12/07/2021 1327   CREATININE 1.0 10/08/2017 1410   CREATININE 0.8 02/17/2017 1132      Component Value Date/Time   CALCIUM 9.7 12/07/2021 1327   CALCIUM 9.0 10/08/2017 1410   CALCIUM 9.3 02/17/2017 1132   ALKPHOS 68 12/07/2021 1327   ALKPHOS 59 10/08/2017 1410   ALKPHOS 79 02/17/2017 1132   AST 19 12/07/2021 1327   AST 22 02/17/2017 1132   ALT 14 12/07/2021 1327   ALT 26 10/08/2017 1410   ALT 20 02/17/2017 1132   BILITOT 0.5 12/07/2021 1327   BILITOT 0.39 02/17/2017 1132       Impression and Plan: Ms. Ruvalcaba is a very pleasant 71 yo caucasian female with secondary polycythemia (JAK-2 negative) due to COPD. No phlebotomy needed this visit! Follow-up in 6 weeks.   Lottie Dawson, NP 2/14/20231:56 PM

## 2022-01-16 LAB — IRON AND IRON BINDING CAPACITY (CC-WL,HP ONLY)
Iron: 47 ug/dL (ref 28–170)
Saturation Ratios: 9 % — ABNORMAL LOW (ref 10.4–31.8)
TIBC: 550 ug/dL — ABNORMAL HIGH (ref 250–450)
UIBC: 503 ug/dL — ABNORMAL HIGH (ref 148–442)

## 2022-01-16 LAB — FERRITIN: Ferritin: <4 ng/mL — ABNORMAL LOW (ref 11–307)

## 2022-01-18 ENCOUNTER — Telehealth: Payer: Self-pay | Admitting: Internal Medicine

## 2022-01-18 MED ORDER — AZITHROMYCIN 250 MG PO TABS: 250.0000 mg | ORAL_TABLET | Freq: Every day | ORAL | 0 refills | Status: DC

## 2022-01-18 NOTE — Telephone Encounter (Signed)
Called patient and let her know the zpack was sent in for her today. No questions noted from patient. Nothing further needed

## 2022-01-18 NOTE — Telephone Encounter (Signed)
Called patient she states that she is having a bronchitis flare up. She is coughing up some beige colored mucus now. She states that last week you gave her husband Daryl a zpack for the same thing. No fever noted just coughing and mucus production.  Dr Annamaria Boots please advise

## 2022-01-18 NOTE — Telephone Encounter (Signed)
Zpak 250 mg, # 6, 2 today then one daily 

## 2022-02-01 ENCOUNTER — Telehealth: Payer: Self-pay | Admitting: Internal Medicine

## 2022-02-01 NOTE — Telephone Encounter (Signed)
Called and spoke with pt stating to her that she needed to call PCP to request meds to help with her swollen tonsils. Pt verbalized understanding. Nothing further needed. ?

## 2022-02-21 ENCOUNTER — Other Ambulatory Visit: Payer: Self-pay | Admitting: *Deleted

## 2022-02-21 DIAGNOSIS — Z8673 Personal history of transient ischemic attack (TIA), and cerebral infarction without residual deficits: Secondary | ICD-10-CM

## 2022-02-21 DIAGNOSIS — D45 Polycythemia vera: Secondary | ICD-10-CM

## 2022-02-21 MED ORDER — CLOPIDOGREL BISULFATE 75 MG PO TABS: 75.0000 mg | ORAL_TABLET | Freq: Every day | ORAL | 1 refills | Status: DC

## 2022-02-26 ENCOUNTER — Inpatient Hospital Stay: Payer: Medicare HMO

## 2022-02-26 ENCOUNTER — Inpatient Hospital Stay: Payer: Medicare HMO | Admitting: Family

## 2022-03-06 ENCOUNTER — Inpatient Hospital Stay: Payer: Medicare HMO

## 2022-03-06 ENCOUNTER — Inpatient Hospital Stay: Payer: Medicare HMO | Attending: Hematology & Oncology

## 2022-03-06 ENCOUNTER — Encounter: Payer: Self-pay | Admitting: Family

## 2022-03-06 ENCOUNTER — Inpatient Hospital Stay (HOSPITAL_BASED_OUTPATIENT_CLINIC_OR_DEPARTMENT_OTHER): Payer: Medicare HMO | Admitting: Family

## 2022-03-06 VITALS — BP 130/75 | HR 72 | Temp 98.1°F | Resp 20 | Wt 198.0 lb

## 2022-03-06 DIAGNOSIS — D751 Secondary polycythemia: Secondary | ICD-10-CM

## 2022-03-06 DIAGNOSIS — J449 Chronic obstructive pulmonary disease, unspecified: Secondary | ICD-10-CM | POA: Diagnosis not present

## 2022-03-06 DIAGNOSIS — D500 Iron deficiency anemia secondary to blood loss (chronic): Secondary | ICD-10-CM | POA: Insufficient documentation

## 2022-03-06 DIAGNOSIS — Z9981 Dependence on supplemental oxygen: Secondary | ICD-10-CM | POA: Diagnosis not present

## 2022-03-06 DIAGNOSIS — Z7902 Long term (current) use of antithrombotics/antiplatelets: Secondary | ICD-10-CM | POA: Diagnosis not present

## 2022-03-06 DIAGNOSIS — Z79899 Other long term (current) drug therapy: Secondary | ICD-10-CM | POA: Diagnosis not present

## 2022-03-06 DIAGNOSIS — Z8673 Personal history of transient ischemic attack (TIA), and cerebral infarction without residual deficits: Secondary | ICD-10-CM | POA: Diagnosis not present

## 2022-03-06 DIAGNOSIS — D45 Polycythemia vera: Secondary | ICD-10-CM

## 2022-03-06 LAB — CMP (CANCER CENTER ONLY)
ALT: 17 U/L (ref 0–44)
AST: 20 U/L (ref 15–41)
Albumin: 4.1 g/dL (ref 3.5–5.0)
Alkaline Phosphatase: 59 U/L (ref 38–126)
Anion gap: 5 (ref 5–15)
BUN: 15 mg/dL (ref 8–23)
CO2: 29 mmol/L (ref 22–32)
Calcium: 9.6 mg/dL (ref 8.9–10.3)
Chloride: 104 mmol/L (ref 98–111)
Creatinine: 0.87 mg/dL (ref 0.44–1.00)
GFR, Estimated: >60 mL/min (ref >60)
Glucose, Bld: 106 mg/dL — ABNORMAL HIGH (ref 70–99)
Potassium: 4.6 mmol/L (ref 3.5–5.1)
Sodium: 138 mmol/L (ref 135–145)
Total Bilirubin: 0.4 mg/dL (ref 0.3–1.2)
Total Protein: 6.2 g/dL — ABNORMAL LOW (ref 6.5–8.1)

## 2022-03-06 LAB — CBC WITH DIFFERENTIAL (CANCER CENTER ONLY)
Abs Immature Granulocytes: 0.01 10*3/uL (ref 0.00–0.07)
Basophils Absolute: 0.1 10*3/uL (ref 0.0–0.1)
Basophils Relative: 1 %
Eosinophils Absolute: 0.2 10*3/uL (ref 0.0–0.5)
Eosinophils Relative: 3 %
HCT: 36.3 % (ref 36.0–46.0)
Hemoglobin: 10.7 g/dL — ABNORMAL LOW (ref 12.0–15.0)
Immature Granulocytes: 0 %
Lymphocytes Relative: 26 %
Lymphs Abs: 1.4 10*3/uL (ref 0.7–4.0)
MCH: 21.7 pg — ABNORMAL LOW (ref 26.0–34.0)
MCHC: 29.5 g/dL — ABNORMAL LOW (ref 30.0–36.0)
MCV: 73.6 fL — ABNORMAL LOW (ref 80.0–100.0)
Monocytes Absolute: 0.5 10*3/uL (ref 0.1–1.0)
Monocytes Relative: 10 %
Neutro Abs: 3.2 10*3/uL (ref 1.7–7.7)
Neutrophils Relative %: 60 %
Platelet Count: 253 10*3/uL (ref 150–400)
RBC: 4.93 MIL/uL (ref 3.87–5.11)
RDW: 17.2 % — ABNORMAL HIGH (ref 11.5–15.5)
WBC Count: 5.4 10*3/uL (ref 4.0–10.5)
nRBC: 0 % (ref 0.0–0.2)

## 2022-03-06 NOTE — Progress Notes (Signed)
?Hematology and Oncology Follow Up Visit ? ?Natalie Hendrix ?[id number] ?Nov 17, 1951 71 y.o. ?03/06/2022 ? ? ?Principle Diagnosis:  ?Secondary polycythemia - JAK2 negative ?  ?Current Therapy:        ?Phlebotomy to maintain hematocrit less than 38% ?Plavix 75 mg by mouth daily ?  ?Interim History:  [name] is here today with her husband for follow-up. She is doing fairly well. She has some fatigue and occasional angina.  ?She states that taking a nitro tab resolves her chest pain.  ?SOB stable on supplemental O2. She has occasional dizziness.  ?No falls or syncope to report.  ?She ambulated with a cane or Rolator for added support.  ?No fever, chills, n/v, cough, rash, palpitations, abdominal pain or changes in bowel or bladder habits.   ?She has maintained a good appetite and is staying well hydrated. Her weight is stable at 198 lbs.  ? ?ECOG Performance Status: 1 - Symptomatic but completely ambulatory ? ?Medications:  ?Allergies as of 03/06/2022   ? ?   Reactions  ? Epinephrine Palpitations  ? Raises heart rate  ? Flonase [fluticasone] Palpitations  ? Protriptyline Hcl Other (See Comments)  ? Severe constipation  ? Tamiflu [oseltamivir Phosphate] Other (See Comments)  ? "like I was having a stroke"  ? Tamiflu [oseltamivir] Anaphylaxis  ? Tizanidine Other (See Comments)  ? panic  ? Gabapentin Other (See Comments)  ? arthralgia ?Other reaction(s): Arthralgia (Joint Pain)  ? Other Hives, Rash  ? boiron acteane  ? Qvar [beclomethasone] Other (See Comments)  ? Spiriva [tiotropium Bromide Monohydrate] Rash  ? Tiotropium Rash  ? Zonegran Other (See Comments)  ? Mood swings  ? Advair Diskus [fluticasone-salmeterol] Rash  ? Baclofen Rash  ? Chlorzoxazone Rash  ? Lamotrigine Rash  ? Levofloxacin Rash  ? Protriptyline Rash  ? Ropinirole Rash  ? Tiotropium Bromide Monohydrate Rash  ? Verapamil Rash  ? ?  ? ?  ?Medication List  ?  ? ?  ? Accurate as of March 06, 2022  2:05 PM. If you have any questions, ask your nurse or doctor.  ?   ?  ? ?  ? ?albuterol 108 (90 Base) MCG/ACT inhaler ?Commonly known as: ProAir HFA ?Inhale 2 puffs into the lungs every 6 (six) hours as needed for wheezing or shortness of breath. ?  ?albuterol (2.5 MG/3ML) 0.083% nebulizer solution ?Commonly known as: PROVENTIL ?Take 3 mLs (2.5 mg total) by nebulization every 6 (six) hours as needed for wheezing or shortness of breath. ?  ?azelastine 0.1 % nasal spray ?Commonly known as: ASTELIN ?Place 1 spray into both nostrils 2 (two) times daily. ?  ?azithromycin 250 MG tablet ?Commonly known as: ZITHROMAX ?Take 1 tablet (250 mg total) by mouth daily. ?  ?beclomethasone 42 MCG/SPRAY nasal spray ?Commonly known as: BECONASE-AQ ?2 puffs each nostril once daily ?  ?benzonatate 100 MG capsule ?Commonly known as: TESSALON ?Take 1 capsule (100 mg total) by mouth every 6 (six) hours as needed for cough. ?  ?buPROPion 300 MG 24 hr tablet ?Commonly known as: WELLBUTRIN XL ?Take 1 tablet (300 mg total) by mouth daily with breakfast. ?  ?CALCIUM 1200 PO ?Take 1,250 mg by mouth daily. ?  ?Centrum Silver tablet ?Take 1 tablet by mouth daily. ?  ?clopidogrel 75 MG tablet ?Commonly known as: PLAVIX ?Take 1 tablet (75 mg total) by mouth daily. ?  ?cyclobenzaprine 10 MG tablet ?Commonly known as: FLEXERIL ?Take 10 mg by mouth every 8 (eight) hours as needed. ?  ?  ESTRACE VAGINAL 0.1 MG/GM vaginal cream ?Generic drug: estradiol ?Place 1 Applicatorful vaginally daily. Small amount each dose per pt ?  ?ezetimibe 10 MG tablet ?Commonly known as: Zetia ?TAKE 1 TABLET BY MOUTH DAILY. ?  ?fexofenadine 180 MG tablet ?Commonly known as: Allegra Allergy ?Take 1 tablet (180 mg total) by mouth daily. ?  ?Fish Oil 1200 MG Caps ?Take 1,200 mg by mouth at bedtime. ?  ?hydrOXYzine 10 MG tablet ?Commonly known as: ATARAX ?Take 10 mg by mouth 3 (three) times daily as needed. ?  ?hyoscyamine 0.125 MG Tbdp disintergrating tablet ?Commonly known as: ANASPAZ ?Place 1 tablet (0.125 mg total) under the tongue 2 (two)  times daily as needed. ?What changed: reasons to take this ?  ?lidocaine 5 % ?Commonly known as: LIDODERM ?Place 1 patch onto the skin as needed (pain). Remove & Discard patch within 12 hours or as directed by MD ?  ?LORazepam 1 MG tablet ?Commonly known as: ATIVAN ?Take 1 tablet (1 mg total) by mouth 2 (two) times daily. May take extra 3rd dose if needed ?What changed:  ?when to take this ?reasons to take this ?  ?magnesium gluconate 500 MG tablet ?Commonly known as: MAGONATE ?Take 500 mg by mouth daily as needed (constipation). ?  ?meclizine 25 MG tablet ?Commonly known as: ANTIVERT ?Take 1 tablet (25 mg total) by mouth 3 (three) times daily as needed for dizziness. 3 month supply ?What changed: when to take this ?  ?metoprolol succinate 50 MG 24 hr tablet ?Commonly known as: TOPROL-XL ?Take 1 tablet (50 mg total) by mouth daily with breakfast. ?What changed: how much to take ?  ?montelukast 10 MG tablet ?Commonly known as: SINGULAIR ?TAKE ONE TABLET BY MOUTH EVERY DAY ?  ?morphine 15 MG tablet ?Commonly known as: MSIR ?Take 15 mg by mouth every 4 (four) hours as needed for severe pain. ?  ?morphine 30 MG 12 hr tablet ?Commonly known as: MS CONTIN ?Take 1 tablet by mouth every 12 (twelve) hours. ?  ?Nebulizer Devi ?Use as directed ?  ?OXYGEN ?Inhale 2.5 L/hr into the lungs continuous. ?  ?pantoprazole 40 MG tablet ?Commonly known as: PROTONIX ?TAKE 1 TABLET (40 MG TOTAL) BY MOUTH DAILY. ?  ?Polyethyl Glycol-Propyl Glycol 0.4-0.3 % Soln ?Apply 1 drop to eye daily as needed (dry eyes). ?  ?pravastatin 40 MG tablet ?Commonly known as: PRAVACHOL ?Take 1 tablet (40 mg total) by mouth at bedtime. ?  ?promethazine 25 MG tablet ?Commonly known as: PHENERGAN ?Take 25 mg by mouth every 8 (eight) hours as needed for nausea or vomiting. ?  ?TART CHERRY ADVANCED PO ?Take 5 mLs by mouth daily. Juice Concentrate- 1tsp daily ?  ?Zovirax 5 % ?Generic drug: acyclovir ointment ?Apply 1 application topically as needed (flair). ?  ? ?   ? ? ?Allergies:  ?Allergies  ?Allergen Reactions  ? Epinephrine Palpitations  ?  Raises heart rate ?  ? Flonase [Fluticasone] Palpitations  ? Protriptyline Hcl Other (See Comments)  ?  Severe constipation  ? Tamiflu [Oseltamivir Phosphate] Other (See Comments)  ?  "like I was having a stroke"  ? Tamiflu [Oseltamivir] Anaphylaxis  ? Tizanidine Other (See Comments)  ?  panic  ? Gabapentin Other (See Comments)  ?  arthralgia ?Other reaction(s): Arthralgia (Joint Pain)  ? Other Hives and Rash  ?  boiron acteane  ? Qvar [Beclomethasone] Other (See Comments)  ? Spiriva [Tiotropium Bromide Monohydrate] Rash  ? Tiotropium Rash  ? Zonegran Other (See Comments)  ?  Mood swings  ? Advair Diskus [Fluticasone-Salmeterol] Rash  ? Baclofen Rash  ? Chlorzoxazone Rash  ? Lamotrigine Rash  ? Levofloxacin Rash  ? Protriptyline Rash  ? Ropinirole Rash  ? Tiotropium Bromide Monohydrate Rash  ? Verapamil Rash  ? ? ?Past Medical History, Surgical history, Social history, and Family History were reviewed and updated. ? ?Review of Systems: ?All other 10 point review of systems is negative.  ? ?Physical Exam: ? weight is 198 lb (89.8 kg). Her oral temperature is 98.1 ?F (36.7 ?C). Her blood pressure is 130/75 and her pulse is 72. Her respiration is 20 and oxygen saturation is 97%.  ? ?Wt Readings from Last 3 Encounters:  ?03/06/22 198 lb (89.8 kg)  ?01/15/22 199 lb (90.3 kg)  ?12/07/21 195 lb (88.5 kg)  ? ? ?Ocular: Sclerae unicteric, pupils equal, round and reactive to light ?Ear-nose-throat: Oropharynx clear, dentition fair ?Lymphatic: No cervical or supraclavicular adenopathy ?Lungs no rales or rhonchi, good excursion bilaterally ?Heart regular rate and rhythm, no murmur appreciated ?Abd soft, nontender, positive bowel sounds ?MSK no focal spinal tenderness, no joint edema ?Neuro: non-focal, well-oriented, appropriate affect ?Breasts: Deferred  ? ?Lab Results  ?Component Value Date  ? WBC 5.4 03/06/2022  ? HGB 10.7 (L) 03/06/2022  ? HCT  36.3 03/06/2022  ? MCV 73.6 (L) 03/06/2022  ? PLT 253 03/06/2022  ? ?Lab Results  ?Component Value Date  ? FERRITIN <4 (L) 01/15/2022  ? IRON 47 01/15/2022  ? TIBC 550 (H) 01/15/2022  ? UIBC 503 (H)

## 2022-03-07 LAB — IRON AND IRON BINDING CAPACITY (CC-WL,HP ONLY)
Iron: 31 ug/dL (ref 28–170)
Saturation Ratios: 6 % — ABNORMAL LOW (ref 10.4–31.8)
TIBC: 525 ug/dL — ABNORMAL HIGH (ref 250–450)
UIBC: 494 ug/dL — ABNORMAL HIGH (ref 148–442)

## 2022-03-07 LAB — FERRITIN: Ferritin: 4 ng/mL — ABNORMAL LOW (ref 11–307)

## 2022-03-29 ENCOUNTER — Other Ambulatory Visit: Payer: Self-pay | Admitting: Obstetrics and Gynecology

## 2022-03-29 DIAGNOSIS — Z1231 Encounter for screening mammogram for malignant neoplasm of breast: Secondary | ICD-10-CM

## 2022-04-08 ENCOUNTER — Ambulatory Visit: Payer: Medicare HMO

## 2022-04-17 ENCOUNTER — Encounter: Payer: Self-pay | Admitting: Family

## 2022-04-17 ENCOUNTER — Inpatient Hospital Stay (HOSPITAL_BASED_OUTPATIENT_CLINIC_OR_DEPARTMENT_OTHER): Payer: Medicare HMO | Admitting: Family

## 2022-04-17 ENCOUNTER — Inpatient Hospital Stay: Payer: Medicare HMO | Attending: Hematology & Oncology

## 2022-04-17 ENCOUNTER — Inpatient Hospital Stay: Payer: Medicare HMO

## 2022-04-17 VITALS — BP 136/67 | HR 71 | Temp 98.3°F | Resp 18 | Wt 195.1 lb

## 2022-04-17 DIAGNOSIS — Z7902 Long term (current) use of antithrombotics/antiplatelets: Secondary | ICD-10-CM | POA: Insufficient documentation

## 2022-04-17 DIAGNOSIS — D500 Iron deficiency anemia secondary to blood loss (chronic): Secondary | ICD-10-CM

## 2022-04-17 DIAGNOSIS — R002 Palpitations: Secondary | ICD-10-CM | POA: Insufficient documentation

## 2022-04-17 DIAGNOSIS — J449 Chronic obstructive pulmonary disease, unspecified: Secondary | ICD-10-CM | POA: Diagnosis not present

## 2022-04-17 DIAGNOSIS — R0602 Shortness of breath: Secondary | ICD-10-CM | POA: Insufficient documentation

## 2022-04-17 DIAGNOSIS — Z9981 Dependence on supplemental oxygen: Secondary | ICD-10-CM | POA: Diagnosis not present

## 2022-04-17 DIAGNOSIS — Z79899 Other long term (current) drug therapy: Secondary | ICD-10-CM | POA: Insufficient documentation

## 2022-04-17 DIAGNOSIS — D751 Secondary polycythemia: Secondary | ICD-10-CM | POA: Diagnosis present

## 2022-04-17 LAB — FERRITIN: Ferritin: 4 ng/mL — ABNORMAL LOW (ref 11–307)

## 2022-04-17 LAB — CBC WITH DIFFERENTIAL (CANCER CENTER ONLY)
Abs Immature Granulocytes: 0.01 10*3/uL (ref 0.00–0.07)
Basophils Absolute: 0.1 10*3/uL (ref 0.0–0.1)
Basophils Relative: 1 %
Eosinophils Absolute: 0.1 10*3/uL (ref 0.0–0.5)
Eosinophils Relative: 2 %
HCT: 36.7 % (ref 36.0–46.0)
Hemoglobin: 11.1 g/dL — ABNORMAL LOW (ref 12.0–15.0)
Immature Granulocytes: 0 %
Lymphocytes Relative: 27 %
Lymphs Abs: 1.4 10*3/uL (ref 0.7–4.0)
MCH: 22.4 pg — ABNORMAL LOW (ref 26.0–34.0)
MCHC: 30.2 g/dL (ref 30.0–36.0)
MCV: 74.1 fL — ABNORMAL LOW (ref 80.0–100.0)
Monocytes Absolute: 0.5 10*3/uL (ref 0.1–1.0)
Monocytes Relative: 8 %
Neutro Abs: 3.4 10*3/uL (ref 1.7–7.7)
Neutrophils Relative %: 62 %
Platelet Count: 257 10*3/uL (ref 150–400)
RBC: 4.95 MIL/uL (ref 3.87–5.11)
RDW: 19 % — ABNORMAL HIGH (ref 11.5–15.5)
WBC Count: 5.4 10*3/uL (ref 4.0–10.5)
nRBC: 0 % (ref 0.0–0.2)

## 2022-04-17 LAB — CMP (CANCER CENTER ONLY)
ALT: 20 U/L (ref 0–44)
AST: 22 U/L (ref 15–41)
Albumin: 3.9 g/dL (ref 3.5–5.0)
Alkaline Phosphatase: 56 U/L (ref 38–126)
Anion gap: 6 (ref 5–15)
BUN: 19 mg/dL (ref 8–23)
CO2: 26 mmol/L (ref 22–32)
Calcium: 9 mg/dL (ref 8.9–10.3)
Chloride: 106 mmol/L (ref 98–111)
Creatinine: 0.72 mg/dL (ref 0.44–1.00)
GFR, Estimated: >60 mL/min (ref >60)
Glucose, Bld: 107 mg/dL — ABNORMAL HIGH (ref 70–99)
Potassium: 4.5 mmol/L (ref 3.5–5.1)
Sodium: 138 mmol/L (ref 135–145)
Total Bilirubin: 0.3 mg/dL (ref 0.3–1.2)
Total Protein: 6.4 g/dL — ABNORMAL LOW (ref 6.5–8.1)

## 2022-04-17 NOTE — Progress Notes (Signed)
?Hematology and Oncology Follow Up Visit ? ?Natalie Hendrix ?[id number] ?December 22, 1950 71 y.o. ?04/17/2022 ? ? ?Principle Diagnosis:  ?Secondary polycythemia - JAK2 negative ?  ?Current Therapy:        ?Phlebotomy to maintain hematocrit less than 38% ?Plavix 75 mg by mouth daily ?  ?Interim History:  Natalie Hendrix is here today with her husband for follow-up. She is tearful during our visit. She is struggling with having to slow down and not being able to be as active as she once was.  ?Her memory recall and speech delay frustrate her. She is hoping to get an appointment with neurology the first week of June for follow-up.  ?She has occasional falls due to poor balance and dizziness. Thankfully she has not been injured recently.  ?No fever, chills, n/v, cough, rash, chest pain, abdominal pain or changes in bowel or bladder habits.  ?No obvious blood loss noted. No petechiae.  ?Her SOB is stable on supplemental O2 24 hours a day.  ?She has occasional palpitations.  ?No swelling or tenderness in her extremities.  ?Appetite is ok and she is doing her best to stay well hydrated. Her husband is good to encourage her to eat regularly and drink fluids. Her weight is stable at 195 lbs.  ? ?ECOG Performance Status: 1 - Symptomatic but completely ambulatory ? ?Medications:  ?Allergies as of 04/17/2022   ? ?   Reactions  ? Epinephrine Palpitations  ? Raises heart rate  ? Flonase [fluticasone] Palpitations  ? Protriptyline Hcl Other (See Comments)  ? Severe constipation  ? Tamiflu [oseltamivir Phosphate] Other (See Comments)  ? "like I was having a stroke"  ? Tamiflu [oseltamivir] Anaphylaxis  ? Tizanidine Other (See Comments)  ? panic  ? Gabapentin Other (See Comments)  ? arthralgia ?Other reaction(s): Arthralgia (Joint Pain)  ? Other Hives, Rash  ? boiron acteane  ? Qvar [beclomethasone] Other (See Comments)  ? Spiriva [tiotropium Bromide Monohydrate] Rash  ? Tiotropium Rash  ? Zonegran Other (See Comments)  ? Mood swings  ? Advair  Diskus [fluticasone-salmeterol] Rash  ? Baclofen Rash  ? Chlorzoxazone Rash  ? Lamotrigine Rash  ? Levofloxacin Rash  ? Protriptyline Rash  ? Ropinirole Rash  ? Tiotropium Bromide Monohydrate Rash  ? Verapamil Rash  ? ?  ? ?  ?Medication List  ?  ? ?  ? Accurate as of Apr 17, 2022  1:25 PM. If you have any questions, ask your nurse or doctor.  ?  ?  ? ?  ? ?albuterol 108 (90 Base) MCG/ACT inhaler ?Commonly known as: ProAir HFA ?Inhale 2 puffs into the lungs every 6 (six) hours as needed for wheezing or shortness of breath. ?  ?albuterol (2.5 MG/3ML) 0.083% nebulizer solution ?Commonly known as: PROVENTIL ?Take 3 mLs (2.5 mg total) by nebulization every 6 (six) hours as needed for wheezing or shortness of breath. ?  ?azelastine 0.1 % nasal spray ?Commonly known as: ASTELIN ?Place 1 spray into both nostrils 2 (two) times daily. ?  ?azithromycin 250 MG tablet ?Commonly known as: ZITHROMAX ?Take 1 tablet (250 mg total) by mouth daily. ?  ?beclomethasone 42 MCG/SPRAY nasal spray ?Commonly known as: BECONASE-AQ ?2 puffs each nostril once daily ?  ?benzonatate 100 MG capsule ?Commonly known as: TESSALON ?Take 1 capsule (100 mg total) by mouth every 6 (six) hours as needed for cough. ?  ?buPROPion 300 MG 24 hr tablet ?Commonly known as: WELLBUTRIN XL ?Take 1 tablet (300 mg total) by mouth daily  with breakfast. ?  ?CALCIUM 1200 PO ?Take 1,250 mg by mouth daily. ?  ?Centrum Silver tablet ?Take 1 tablet by mouth daily. ?  ?clopidogrel 75 MG tablet ?Commonly known as: PLAVIX ?Take 1 tablet (75 mg total) by mouth daily. ?  ?cyclobenzaprine 10 MG tablet ?Commonly known as: FLEXERIL ?Take 10 mg by mouth every 8 (eight) hours as needed. ?  ?ESTRACE VAGINAL 0.1 MG/GM vaginal cream ?Generic drug: estradiol ?Place 1 Applicatorful vaginally daily. Small amount each dose per pt ?  ?ezetimibe 10 MG tablet ?Commonly known as: Zetia ?TAKE 1 TABLET BY MOUTH DAILY. ?  ?fexofenadine 180 MG tablet ?Commonly known as: Allegra Allergy ?Take 1  tablet (180 mg total) by mouth daily. ?  ?Fish Oil 1200 MG Caps ?Take 1,200 mg by mouth at bedtime. ?  ?hydrOXYzine 10 MG tablet ?Commonly known as: ATARAX ?Take 10 mg by mouth 3 (three) times daily as needed. ?  ?hyoscyamine 0.125 MG Tbdp disintergrating tablet ?Commonly known as: ANASPAZ ?Place 1 tablet (0.125 mg total) under the tongue 2 (two) times daily as needed. ?What changed: reasons to take this ?  ?lidocaine 5 % ?Commonly known as: LIDODERM ?Place 1 patch onto the skin as needed (pain). Remove & Discard patch within 12 hours or as directed by MD ?  ?LORazepam 1 MG tablet ?Commonly known as: ATIVAN ?Take 1 tablet (1 mg total) by mouth 2 (two) times daily. May take extra 3rd dose if needed ?What changed:  ?when to take this ?reasons to take this ?  ?magnesium gluconate 500 MG tablet ?Commonly known as: MAGONATE ?Take 500 mg by mouth daily as needed (constipation). ?  ?meclizine 25 MG tablet ?Commonly known as: ANTIVERT ?Take 1 tablet (25 mg total) by mouth 3 (three) times daily as needed for dizziness. 3 month supply ?What changed: when to take this ?  ?metoprolol succinate 50 MG 24 hr tablet ?Commonly known as: TOPROL-XL ?Take 1 tablet (50 mg total) by mouth daily with breakfast. ?What changed: how much to take ?  ?montelukast 10 MG tablet ?Commonly known as: SINGULAIR ?TAKE ONE TABLET BY MOUTH EVERY DAY ?  ?morphine 15 MG tablet ?Commonly known as: MSIR ?Take 15 mg by mouth every 4 (four) hours as needed for severe pain. ?  ?morphine 30 MG 12 hr tablet ?Commonly known as: MS CONTIN ?Take 1 tablet by mouth every 12 (twelve) hours. ?  ?Nebulizer Devi ?Use as directed ?  ?OXYGEN ?Inhale 2.5 L/hr into the lungs continuous. ?  ?pantoprazole 40 MG tablet ?Commonly known as: PROTONIX ?TAKE 1 TABLET (40 MG TOTAL) BY MOUTH DAILY. ?  ?Polyethyl Glycol-Propyl Glycol 0.4-0.3 % Soln ?Apply 1 drop to eye daily as needed (dry eyes). ?  ?pravastatin 40 MG tablet ?Commonly known as: PRAVACHOL ?Take 1 tablet (40 mg total)  by mouth at bedtime. ?  ?promethazine 25 MG tablet ?Commonly known as: PHENERGAN ?Take 25 mg by mouth every 8 (eight) hours as needed for nausea or vomiting. ?  ?TART CHERRY ADVANCED PO ?Take 5 mLs by mouth daily. Juice Concentrate- 1tsp daily ?  ?Zovirax 5 % ?Generic drug: acyclovir ointment ?Apply 1 application topically as needed (flair). ?  ? ?  ? ? ?Allergies:  ?Allergies  ?Allergen Reactions  ? Epinephrine Palpitations  ?  Raises heart rate ?  ? Flonase [Fluticasone] Palpitations  ? Protriptyline Hcl Other (See Comments)  ?  Severe constipation  ? Tamiflu [Oseltamivir Phosphate] Other (See Comments)  ?  "like I was having a stroke"  ?  Tamiflu [Oseltamivir] Anaphylaxis  ? Tizanidine Other (See Comments)  ?  panic  ? Gabapentin Other (See Comments)  ?  arthralgia ?Other reaction(s): Arthralgia (Joint Pain)  ? Other Hives and Rash  ?  boiron acteane  ? Qvar [Beclomethasone] Other (See Comments)  ? Spiriva [Tiotropium Bromide Monohydrate] Rash  ? Tiotropium Rash  ? Zonegran Other (See Comments)  ?  Mood swings  ? Advair Diskus [Fluticasone-Salmeterol] Rash  ? Baclofen Rash  ? Chlorzoxazone Rash  ? Lamotrigine Rash  ? Levofloxacin Rash  ? Protriptyline Rash  ? Ropinirole Rash  ? Tiotropium Bromide Monohydrate Rash  ? Verapamil Rash  ? ? ?Past Medical History, Surgical history, Social history, and Family History were reviewed and updated. ? ?Review of Systems: ?All other 10 point review of systems is negative.  ? ?Physical Exam: ? weight is 195 lb 1.9 oz (88.5 kg). Her oral temperature is 98.3 ?F (36.8 ?C). Her blood pressure is 136/67 and her pulse is 71. Her respiration is 18 and oxygen saturation is 97%.  ? ?Wt Readings from Last 3 Encounters:  ?04/17/22 195 lb 1.9 oz (88.5 kg)  ?03/06/22 198 lb (89.8 kg)  ?01/15/22 199 lb (90.3 kg)  ? ? ?Ocular: Sclerae unicteric, pupils equal, round and reactive to light ?Ear-nose-throat: Oropharynx clear, dentition fair ?Lymphatic: No cervical or supraclavicular  adenopathy ?Lungs no rales or rhonchi, good excursion bilaterally ?Heart regular rate and rhythm, no murmur appreciated ?Abd soft, nontender, positive bowel sounds ?MSK no focal spinal tenderness, no joint edema ?Neuro: n

## 2022-04-18 LAB — IRON AND IRON BINDING CAPACITY (CC-WL,HP ONLY)
Iron: 29 ug/dL (ref 28–170)
Saturation Ratios: 6 % — ABNORMAL LOW (ref 10.4–31.8)
TIBC: 500 ug/dL — ABNORMAL HIGH (ref 250–450)
UIBC: 471 ug/dL — ABNORMAL HIGH (ref 148–442)

## 2022-04-26 ENCOUNTER — Ambulatory Visit: Payer: Medicare HMO

## 2022-05-03 ENCOUNTER — Ambulatory Visit: Payer: Medicare HMO

## 2022-05-07 ENCOUNTER — Ambulatory Visit: Payer: Medicare HMO

## 2022-05-27 ENCOUNTER — Other Ambulatory Visit: Payer: Self-pay | Admitting: *Deleted

## 2022-05-27 DIAGNOSIS — D45 Polycythemia vera: Secondary | ICD-10-CM

## 2022-05-27 DIAGNOSIS — Z8673 Personal history of transient ischemic attack (TIA), and cerebral infarction without residual deficits: Secondary | ICD-10-CM

## 2022-05-27 MED ORDER — CLOPIDOGREL BISULFATE 75 MG PO TABS: 75.0000 mg | ORAL_TABLET | Freq: Every day | ORAL | 1 refills | Status: DC

## 2022-05-28 ENCOUNTER — Inpatient Hospital Stay: Payer: Medicare HMO | Admitting: Family

## 2022-05-28 ENCOUNTER — Inpatient Hospital Stay: Payer: Medicare HMO

## 2022-05-28 ENCOUNTER — Other Ambulatory Visit: Payer: Self-pay | Admitting: Obstetrics and Gynecology

## 2022-05-28 DIAGNOSIS — Z1231 Encounter for screening mammogram for malignant neoplasm of breast: Secondary | ICD-10-CM

## 2022-05-31 ENCOUNTER — Ambulatory Visit: Payer: Medicare HMO

## 2022-06-07 ENCOUNTER — Inpatient Hospital Stay: Payer: Medicare HMO | Attending: Hematology & Oncology

## 2022-06-07 ENCOUNTER — Inpatient Hospital Stay: Payer: Medicare HMO

## 2022-06-07 ENCOUNTER — Encounter: Payer: Self-pay | Admitting: Family

## 2022-06-07 ENCOUNTER — Inpatient Hospital Stay (HOSPITAL_BASED_OUTPATIENT_CLINIC_OR_DEPARTMENT_OTHER): Payer: Medicare HMO | Admitting: Family

## 2022-06-07 VITALS — BP 100/74 | HR 71 | Resp 18

## 2022-06-07 VITALS — BP 133/69 | HR 81 | Temp 98.1°F | Resp 19 | Wt 193.0 lb

## 2022-06-07 DIAGNOSIS — D500 Iron deficiency anemia secondary to blood loss (chronic): Secondary | ICD-10-CM

## 2022-06-07 DIAGNOSIS — R0602 Shortness of breath: Secondary | ICD-10-CM | POA: Insufficient documentation

## 2022-06-07 DIAGNOSIS — D751 Secondary polycythemia: Secondary | ICD-10-CM

## 2022-06-07 DIAGNOSIS — D45 Polycythemia vera: Secondary | ICD-10-CM

## 2022-06-07 DIAGNOSIS — J449 Chronic obstructive pulmonary disease, unspecified: Secondary | ICD-10-CM | POA: Diagnosis not present

## 2022-06-07 LAB — CMP (CANCER CENTER ONLY)
ALT: 18 U/L (ref 0–44)
AST: 23 U/L (ref 15–41)
Albumin: 4.2 g/dL (ref 3.5–5.0)
Alkaline Phosphatase: 62 U/L (ref 38–126)
Anion gap: 6 (ref 5–15)
BUN: 24 mg/dL — ABNORMAL HIGH (ref 8–23)
CO2: 26 mmol/L (ref 22–32)
Calcium: 9.4 mg/dL (ref 8.9–10.3)
Chloride: 106 mmol/L (ref 98–111)
Creatinine: 0.86 mg/dL (ref 0.44–1.00)
GFR, Estimated: >60 mL/min (ref >60)
Glucose, Bld: 108 mg/dL — ABNORMAL HIGH (ref 70–99)
Potassium: 4.6 mmol/L (ref 3.5–5.1)
Sodium: 138 mmol/L (ref 135–145)
Total Bilirubin: 0.5 mg/dL (ref 0.3–1.2)
Total Protein: 6.5 g/dL (ref 6.5–8.1)

## 2022-06-07 LAB — CBC WITH DIFFERENTIAL (CANCER CENTER ONLY)
Abs Immature Granulocytes: 0.02 10*3/uL (ref 0.00–0.07)
Basophils Absolute: 0 10*3/uL (ref 0.0–0.1)
Basophils Relative: 1 %
Eosinophils Absolute: 0.1 10*3/uL (ref 0.0–0.5)
Eosinophils Relative: 2 %
HCT: 40.3 % (ref 36.0–46.0)
Hemoglobin: 12.3 g/dL (ref 12.0–15.0)
Immature Granulocytes: 0 %
Lymphocytes Relative: 25 %
Lymphs Abs: 1.4 10*3/uL (ref 0.7–4.0)
MCH: 23.6 pg — ABNORMAL LOW (ref 26.0–34.0)
MCHC: 30.5 g/dL (ref 30.0–36.0)
MCV: 77.4 fL — ABNORMAL LOW (ref 80.0–100.0)
Monocytes Absolute: 0.6 10*3/uL (ref 0.1–1.0)
Monocytes Relative: 11 %
Neutro Abs: 3.3 10*3/uL (ref 1.7–7.7)
Neutrophils Relative %: 61 %
Platelet Count: 269 10*3/uL (ref 150–400)
RBC: 5.21 MIL/uL — ABNORMAL HIGH (ref 3.87–5.11)
RDW: 18.3 % — ABNORMAL HIGH (ref 11.5–15.5)
WBC Count: 5.4 10*3/uL (ref 4.0–10.5)
nRBC: 0 % (ref 0.0–0.2)

## 2022-06-07 LAB — FERRITIN: Ferritin: 5 ng/mL — ABNORMAL LOW (ref 11–307)

## 2022-06-07 MED ORDER — SODIUM CHLORIDE 0.9 % IV SOLN: Freq: Once | INTRAVENOUS | Status: AC

## 2022-06-07 NOTE — Patient Instructions (Signed)

## 2022-06-07 NOTE — Progress Notes (Signed)
Hematology and Oncology Follow Up Visit  Natalie Hendrix [id number] 08/28/1951 71 y.o. 06/07/2022   Principle Diagnosis:  Secondary polycythemia - JAK2 negative   Current Therapy:        Phlebotomy to maintain hematocrit less than 38% Plavix 75 mg by mouth daily   Interim History:  Natalie Hendrix is here today with her husband for follow-up. She is symptomatic with hot flashes, sweating, itching and headaches.  Hct is 40.3%.  SOB has seems a little worse with the summer heat. She is wearing her supplement O2 24 hours a day.  No fever, chills, n/v, cough, rash, chest pain, palpitations, abdominal pain or changes in bowel or bladder habits.  No bleeding or petechiae.  She stepped wrong and tore a tendon in her right foot and is wearing a brace for the next 6 months. Hopefully, she will not require surgery.  No falls or syncope to report.  Appetite comes and goes. She is doing her best to stay well hydrated. Her weight is stable at 193 lbs.    ECOG Performance Status: 1 - Symptomatic but completely ambulatory  Medications:  Allergies as of 06/07/2022       Reactions   Epinephrine Palpitations   Raises heart rate   Flonase [fluticasone] Palpitations   Protriptyline Hcl Other (See Comments)   Severe constipation   Tamiflu [oseltamivir Phosphate] Other (See Comments)   "like I was having a stroke"   Tamiflu [oseltamivir] Anaphylaxis   Tizanidine Other (See Comments)   panic   Gabapentin Other (See Comments)   arthralgia Other reaction(s): Arthralgia (Joint Pain)   Other Hives, Rash   boiron acteane   Qvar [beclomethasone] Other (See Comments)   Spiriva [tiotropium Bromide Monohydrate] Rash   Tiotropium Rash   Zonegran Other (See Comments)   Mood swings   Advair Diskus [fluticasone-salmeterol] Rash   Baclofen Rash   Chlorzoxazone Rash   Lamotrigine Rash   Levofloxacin Rash   Protriptyline Rash   Ropinirole Rash   Tiotropium Bromide Monohydrate Rash   Verapamil Rash         Medication List        Accurate as of June 07, 2022  2:42 PM. If you have any questions, ask your nurse or doctor.          albuterol 108 (90 Base) MCG/ACT inhaler Commonly known as: ProAir HFA Inhale 2 puffs into the lungs every 6 (six) hours as needed for wheezing or shortness of breath.   albuterol (2.5 MG/3ML) 0.083% nebulizer solution Commonly known as: PROVENTIL Take 3 mLs (2.5 mg total) by nebulization every 6 (six) hours as needed for wheezing or shortness of breath.   azelastine 0.1 % nasal spray Commonly known as: ASTELIN Place 1 spray into both nostrils 2 (two) times daily.   azithromycin 250 MG tablet Commonly known as: ZITHROMAX Take 1 tablet (250 mg total) by mouth daily.   beclomethasone 42 MCG/SPRAY nasal spray Commonly known as: BECONASE-AQ 2 puffs each nostril once daily   benzonatate 100 MG capsule Commonly known as: TESSALON Take 1 capsule (100 mg total) by mouth every 6 (six) hours as needed for cough.   buPROPion 300 MG 24 hr tablet Commonly known as: WELLBUTRIN XL Take 1 tablet (300 mg total) by mouth daily with breakfast.   CALCIUM 1200 PO Take 1,250 mg by mouth daily.   Centrum Silver tablet Take 1 tablet by mouth daily.   clopidogrel 75 MG tablet Commonly known as: PLAVIX Take 1 tablet (75  mg total) by mouth daily.   cyclobenzaprine 10 MG tablet Commonly known as: FLEXERIL Take 10 mg by mouth every 8 (eight) hours as needed.   ESTRACE VAGINAL 0.1 MG/GM vaginal cream Generic drug: estradiol Place 1 Applicatorful vaginally daily. Small amount each dose per pt   ezetimibe 10 MG tablet Commonly known as: Zetia TAKE 1 TABLET BY MOUTH DAILY.   fexofenadine 180 MG tablet Commonly known as: Allegra Allergy Take 1 tablet (180 mg total) by mouth daily.   Fish Oil 1200 MG Caps Take 1,200 mg by mouth at bedtime.   hydrOXYzine 10 MG tablet Commonly known as: ATARAX Take 10 mg by mouth 3 (three) times daily as needed.   hyoscyamine  0.125 MG Tbdp disintergrating tablet Commonly known as: ANASPAZ Place 1 tablet (0.125 mg total) under the tongue 2 (two) times daily as needed. What changed: reasons to take this   lidocaine 5 % Commonly known as: LIDODERM Place 1 patch onto the skin as needed (pain). Remove & Discard patch within 12 hours or as directed by MD   LORazepam 1 MG tablet Commonly known as: ATIVAN Take 1 tablet (1 mg total) by mouth 2 (two) times daily. May take extra 3rd dose if needed What changed:  when to take this reasons to take this   magnesium gluconate 500 MG tablet Commonly known as: MAGONATE Take 500 mg by mouth daily as needed (constipation).   meclizine 25 MG tablet Commonly known as: ANTIVERT Take 1 tablet (25 mg total) by mouth 3 (three) times daily as needed for dizziness. 3 month supply What changed: when to take this   metoprolol succinate 50 MG 24 hr tablet Commonly known as: TOPROL-XL Take 1 tablet (50 mg total) by mouth daily with breakfast. What changed: how much to take   montelukast 10 MG tablet Commonly known as: SINGULAIR TAKE ONE TABLET BY MOUTH EVERY DAY   morphine 15 MG tablet Commonly known as: MSIR Take 15 mg by mouth every 4 (four) hours as needed for severe pain.   morphine 30 MG 12 hr tablet Commonly known as: MS CONTIN Take 1 tablet by mouth every 12 (twelve) hours.   Nebulizer Devi Use as directed   OXYGEN Inhale 2.5 L/hr into the lungs continuous.   pantoprazole 40 MG tablet Commonly known as: PROTONIX TAKE 1 TABLET (40 MG TOTAL) BY MOUTH DAILY.   Polyethyl Glycol-Propyl Glycol 0.4-0.3 % Soln Apply 1 drop to eye daily as needed (dry eyes).   pravastatin 40 MG tablet Commonly known as: PRAVACHOL Take 1 tablet (40 mg total) by mouth at bedtime.   promethazine 25 MG tablet Commonly known as: PHENERGAN Take 25 mg by mouth every 8 (eight) hours as needed for nausea or vomiting.   TART CHERRY ADVANCED PO Take 5 mLs by mouth daily. Juice  Concentrate- 1tsp daily   Zovirax 5 % Generic drug: acyclovir ointment Apply 1 application topically as needed (flair).        Allergies:  Allergies  Allergen Reactions   Epinephrine Palpitations    Raises heart rate    Flonase [Fluticasone] Palpitations   Protriptyline Hcl Other (See Comments)    Severe constipation   Tamiflu [Oseltamivir Phosphate] Other (See Comments)    "like I was having a stroke"   Tamiflu [Oseltamivir] Anaphylaxis   Tizanidine Other (See Comments)    panic   Gabapentin Other (See Comments)    arthralgia Other reaction(s): Arthralgia (Joint Pain)   Other Hives and Rash  boiron acteane   Qvar [Beclomethasone] Other (See Comments)   Spiriva [Tiotropium Bromide Monohydrate] Rash   Tiotropium Rash   Zonegran Other (See Comments)    Mood swings   Advair Diskus [Fluticasone-Salmeterol] Rash   Baclofen Rash   Chlorzoxazone Rash   Lamotrigine Rash   Levofloxacin Rash   Protriptyline Rash   Ropinirole Rash   Tiotropium Bromide Monohydrate Rash   Verapamil Rash    Past Medical History, Surgical history, Social history, and Family History were reviewed and updated.  Review of Systems: All other 10 point review of systems is negative.   Physical Exam:  vitals were not taken for this visit.   Wt Readings from Last 3 Encounters:  04/17/22 195 lb 1.9 oz (88.5 kg)  03/06/22 198 lb (89.8 kg)  01/15/22 199 lb (90.3 kg)    Ocular: Sclerae unicteric, pupils equal, round and reactive to light Ear-nose-throat: Oropharynx clear, dentition fair Lymphatic: No cervical or supraclavicular adenopathy Lungs no rales or rhonchi, good excursion bilaterally Heart regular rate and rhythm, no murmur appreciated Abd soft, nontender, positive bowel sounds MSK no focal spinal tenderness, no joint edema Neuro: non-focal, well-oriented, appropriate affect Breasts: Deferred   Lab Results  Component Value Date   WBC 5.4 06/07/2022   HGB 12.3 06/07/2022   HCT  40.3 06/07/2022   MCV 77.4 (L) 06/07/2022   PLT 269 06/07/2022   Lab Results  Component Value Date   FERRITIN 4 (L) 04/17/2022   IRON 29 04/17/2022   TIBC 500 (H) 04/17/2022   UIBC 471 (H) 04/17/2022   IRONPCTSAT 6 (L) 04/17/2022   Lab Results  Component Value Date   RETICCTPCT 1.1 04/27/2015   RBC 5.21 (H) 06/07/2022   RETICCTABS 55.1 04/27/2015   No results found for: "KPAFRELGTCHN", "LAMBDASER", "KAPLAMBRATIO" No results found for: "IGGSERUM", "IGA", "IGMSERUM" No results found for: "TOTALPROTELP", "ALBUMINELP", "A1GS", "A2GS", "BETS", "BETA2SER", "GAMS", "MSPIKE", "SPEI"   Chemistry      Component Value Date/Time   NA 138 04/17/2022 1307   NA 147 (H) 10/08/2017 1410   NA 141 02/17/2017 1132   K 4.5 04/17/2022 1307   K 4.6 10/08/2017 1410   K 5.3 (H) 02/17/2017 1132   CL 106 04/17/2022 1307   CL 106 10/08/2017 1410   CO2 26 04/17/2022 1307   CO2 30 10/08/2017 1410   CO2 29 02/17/2017 1132   BUN 19 04/17/2022 1307   BUN 15 10/08/2017 1410   BUN 17.8 02/17/2017 1132   CREATININE 0.72 04/17/2022 1307   CREATININE 1.0 10/08/2017 1410   CREATININE 0.8 02/17/2017 1132      Component Value Date/Time   CALCIUM 9.0 04/17/2022 1307   CALCIUM 9.0 10/08/2017 1410   CALCIUM 9.3 02/17/2017 1132   ALKPHOS 56 04/17/2022 1307   ALKPHOS 59 10/08/2017 1410   ALKPHOS 79 02/17/2017 1132   AST 22 04/17/2022 1307   AST 22 02/17/2017 1132   ALT 20 04/17/2022 1307   ALT 26 10/08/2017 1410   ALT 20 02/17/2017 1132   BILITOT 0.3 04/17/2022 1307   BILITOT 0.39 02/17/2017 1132       Impression and Plan: Natalie Hendrix is a very pleasant 71 yo caucasian female with secondary polycythemia (JAK-2 negative) due to COPD. Phlebotomy and replacement fluids today for Hct 40.3%.  Follow-up in 6 weeks.   Lottie Dawson, NP 7/7/20232:42 PM

## 2022-06-07 NOTE — Progress Notes (Signed)
Natalie Hendrix presents today for phlebotomy per MD orders. Phlebotomy procedure started at 1522 and ended at 1540. 539 cc removed via 18 G needle at L forearm site. Replacement fluids were given. Patient tolerated procedure well. OK to run fluids at 750 ml/hr per Lottie Dawson, NP. IV needle removed intact.

## 2022-06-10 LAB — IRON AND IRON BINDING CAPACITY (CC-WL,HP ONLY)
Iron: 71 ug/dL (ref 28–170)
Saturation Ratios: 14 % (ref 10.4–31.8)
TIBC: 497 ug/dL — ABNORMAL HIGH (ref 250–450)
UIBC: 426 ug/dL (ref 148–442)

## 2022-07-09 ENCOUNTER — Ambulatory Visit
Admission: RE | Admit: 2022-07-09 | Discharge: 2022-07-09 | Disposition: A | Discharge status: Home / Self Care | Payer: Medicare HMO | Source: Ambulatory Visit | Attending: Obstetrics and Gynecology | Admitting: Obstetrics and Gynecology

## 2022-07-09 DIAGNOSIS — Z1231 Encounter for screening mammogram for malignant neoplasm of breast: Secondary | ICD-10-CM

## 2022-07-17 ENCOUNTER — Other Ambulatory Visit: Payer: Self-pay

## 2022-07-17 DIAGNOSIS — D45 Polycythemia vera: Secondary | ICD-10-CM

## 2022-07-17 DIAGNOSIS — Z8673 Personal history of transient ischemic attack (TIA), and cerebral infarction without residual deficits: Secondary | ICD-10-CM

## 2022-07-17 MED ORDER — CLOPIDOGREL BISULFATE 75 MG PO TABS: 75.0000 mg | ORAL_TABLET | Freq: Every day | ORAL | 1 refills | Status: DC

## 2022-07-18 ENCOUNTER — Inpatient Hospital Stay: Payer: Medicare HMO | Admitting: Family

## 2022-07-18 ENCOUNTER — Inpatient Hospital Stay: Payer: Medicare HMO

## 2022-07-22 ENCOUNTER — Other Ambulatory Visit: Payer: Self-pay

## 2022-07-22 ENCOUNTER — Encounter: Payer: Self-pay | Admitting: Family

## 2022-07-22 ENCOUNTER — Inpatient Hospital Stay: Payer: Medicare HMO

## 2022-07-22 ENCOUNTER — Telehealth: Payer: Self-pay | Admitting: *Deleted

## 2022-07-22 ENCOUNTER — Inpatient Hospital Stay: Payer: Medicare HMO | Attending: Hematology & Oncology | Admitting: Family

## 2022-07-22 VITALS — BP 132/54 | HR 78 | Temp 98.4°F | Resp 20 | Ht 67.0 in | Wt 193.0 lb

## 2022-07-22 VITALS — BP 105/79 | HR 80 | Resp 18

## 2022-07-22 DIAGNOSIS — J449 Chronic obstructive pulmonary disease, unspecified: Secondary | ICD-10-CM | POA: Insufficient documentation

## 2022-07-22 DIAGNOSIS — Z862 Personal history of diseases of the blood and blood-forming organs and certain disorders involving the immune mechanism: Secondary | ICD-10-CM | POA: Insufficient documentation

## 2022-07-22 DIAGNOSIS — D45 Polycythemia vera: Secondary | ICD-10-CM

## 2022-07-22 DIAGNOSIS — D500 Iron deficiency anemia secondary to blood loss (chronic): Secondary | ICD-10-CM

## 2022-07-22 DIAGNOSIS — D751 Secondary polycythemia: Secondary | ICD-10-CM | POA: Insufficient documentation

## 2022-07-22 LAB — CBC WITH DIFFERENTIAL (CANCER CENTER ONLY)
Abs Immature Granulocytes: 0.01 10*3/uL (ref 0.00–0.07)
Basophils Absolute: 0 10*3/uL (ref 0.0–0.1)
Basophils Relative: 1 %
Eosinophils Absolute: 0.1 10*3/uL (ref 0.0–0.5)
Eosinophils Relative: 3 %
HCT: 39.3 % (ref 36.0–46.0)
Hemoglobin: 11.8 g/dL — ABNORMAL LOW (ref 12.0–15.0)
Immature Granulocytes: 0 %
Lymphocytes Relative: 24 %
Lymphs Abs: 1.2 10*3/uL (ref 0.7–4.0)
MCH: 23.4 pg — ABNORMAL LOW (ref 26.0–34.0)
MCHC: 30 g/dL (ref 30.0–36.0)
MCV: 77.8 fL — ABNORMAL LOW (ref 80.0–100.0)
Monocytes Absolute: 0.6 10*3/uL (ref 0.1–1.0)
Monocytes Relative: 12 %
Neutro Abs: 3.1 10*3/uL (ref 1.7–7.7)
Neutrophils Relative %: 60 %
Platelet Count: 269 10*3/uL (ref 150–400)
RBC: 5.05 MIL/uL (ref 3.87–5.11)
RDW: 15.6 % — ABNORMAL HIGH (ref 11.5–15.5)
WBC Count: 5.1 10*3/uL (ref 4.0–10.5)
nRBC: 0 % (ref 0.0–0.2)

## 2022-07-22 LAB — CMP (CANCER CENTER ONLY)
ALT: 23 U/L (ref 0–44)
AST: 30 U/L (ref 15–41)
Albumin: 3.7 g/dL (ref 3.5–5.0)
Alkaline Phosphatase: 54 U/L (ref 38–126)
Anion gap: 7 (ref 5–15)
BUN: 17 mg/dL (ref 8–23)
CO2: 29 mmol/L (ref 22–32)
Calcium: 9.2 mg/dL (ref 8.9–10.3)
Chloride: 106 mmol/L (ref 98–111)
Creatinine: 0.73 mg/dL (ref 0.44–1.00)
GFR, Estimated: >60 mL/min (ref >60)
Glucose, Bld: 53 mg/dL — ABNORMAL LOW (ref 70–99)
Potassium: 4.2 mmol/L (ref 3.5–5.1)
Sodium: 142 mmol/L (ref 135–145)
Total Bilirubin: 0.3 mg/dL (ref 0.3–1.2)
Total Protein: 6.5 g/dL (ref 6.5–8.1)

## 2022-07-22 LAB — IRON AND IRON BINDING CAPACITY (CC-WL,HP ONLY)
Iron: 28 ug/dL (ref 28–170)
Saturation Ratios: 5 % — ABNORMAL LOW (ref 10.4–31.8)
TIBC: 539 ug/dL — ABNORMAL HIGH (ref 250–450)
UIBC: 511 ug/dL — ABNORMAL HIGH (ref 148–442)

## 2022-07-22 LAB — FERRITIN: Ferritin: 3 ng/mL — ABNORMAL LOW (ref 11–307)

## 2022-07-22 MED ORDER — SODIUM CHLORIDE 0.9 % IV SOLN: Freq: Once | INTRAVENOUS | Status: AC

## 2022-07-22 NOTE — Patient Instructions (Signed)

## 2022-07-22 NOTE — Progress Notes (Signed)
Natalie Hendrix presents today for phlebotomy per MD orders. Phlebotomy procedure started at 1105 and ended at 1128. 508 grams removed via 20 gauge needle to right AC. Patient observed for 30 minutes after procedure without any incident while receiving replacement fluids. Patient tolerated procedure well. IV needle removed intact.

## 2022-07-22 NOTE — Telephone Encounter (Signed)
Per 07/22/22 los - gave upcoming appointments - confirmed 

## 2022-07-22 NOTE — Progress Notes (Signed)
Hematology and Oncology Follow Up Visit  Natalie Hendrix [id number] 1951-02-08 71 y.o. 07/22/2022   Principle Diagnosis:  Secondary polycythemia - JAK2 negative   Current Therapy:        Phlebotomy to maintain hematocrit less than 38% Plavix 75 mg by mouth daily   Interim History:  [name] is here today with her husband for follow-up. She is symptomatic with itching when outside in the heat and migraines.  Hct is 39.3%.  She notes dizziness with nystagmus and has an appointment with her eye doctor to discuss.  No blood loss or petechiae noted. She does bruise easily on Plavix but not in excess.  No fever, chills, n/v, cough, rash, chest pain, palpitations, abdominal pain or changes in bowel or bladder habits.  She has chronic back pain that waxes and wanes.  Neuropathy in her lower extremities is unchanged from baseline.  She had a mechanical fall this morning but states that she was not seriously injured.  No syncope.  Appetite and hydration have been good. Her weight is stable at 193 lbs.   ECOG Performance Status: 1 - Symptomatic but completely ambulatory  Medications:  Allergies as of 07/22/2022       Reactions   Epinephrine Palpitations   Raises heart rate   Flonase [fluticasone] Palpitations   Protriptyline Hcl Other (See Comments)   Severe constipation   Tamiflu [oseltamivir Phosphate] Other (See Comments)   "like I was having a stroke"   Tamiflu [oseltamivir] Anaphylaxis   Tizanidine Other (See Comments)   panic   Gabapentin Other (See Comments)   arthralgia Other reaction(s): Arthralgia (Joint Pain)   Other Hives, Rash   boiron acteane   Qvar [beclomethasone] Other (See Comments)   Spiriva [tiotropium Bromide Monohydrate] Rash   Tiotropium Rash   Zonegran Other (See Comments)   Mood swings   Advair Diskus [fluticasone-salmeterol] Rash   Baclofen Rash   Chlorzoxazone Rash   Lamotrigine Rash   Levofloxacin Rash   Protriptyline Rash   Ropinirole Rash    Tiotropium Bromide Monohydrate Rash   Verapamil Rash        Medication List        Accurate as of July 22, 2022 10:18 AM. If you have any questions, ask your nurse or doctor.          albuterol 108 (90 Base) MCG/ACT inhaler Commonly known as: ProAir HFA Inhale 2 puffs into the lungs every 6 (six) hours as needed for wheezing or shortness of breath.   albuterol (2.5 MG/3ML) 0.083% nebulizer solution Commonly known as: PROVENTIL Take 3 mLs (2.5 mg total) by nebulization every 6 (six) hours as needed for wheezing or shortness of breath.   azelastine 0.1 % nasal spray Commonly known as: ASTELIN Place 1 spray into both nostrils 2 (two) times daily.   azithromycin 250 MG tablet Commonly known as: ZITHROMAX Take 1 tablet (250 mg total) by mouth daily.   beclomethasone 42 MCG/SPRAY nasal spray Commonly known as: BECONASE-AQ 2 puffs each nostril once daily   benzonatate 100 MG capsule Commonly known as: TESSALON Take 1 capsule (100 mg total) by mouth every 6 (six) hours as needed for cough.   buPROPion 300 MG 24 hr tablet Commonly known as: WELLBUTRIN XL Take 1 tablet (300 mg total) by mouth daily with breakfast.   CALCIUM 1200 PO Take 1,250 mg by mouth daily.   Centrum Silver tablet Take 1 tablet by mouth daily.   clopidogrel 75 MG tablet Commonly known as: PLAVIX  Take 1 tablet (75 mg total) by mouth daily.   cyclobenzaprine 10 MG tablet Commonly known as: FLEXERIL Take 10 mg by mouth every 8 (eight) hours as needed.   ESTRACE VAGINAL 0.1 MG/GM vaginal cream Generic drug: estradiol Place 1 Applicatorful vaginally daily. Small amount each dose per pt   ezetimibe 10 MG tablet Commonly known as: Zetia TAKE 1 TABLET BY MOUTH DAILY.   fexofenadine 180 MG tablet Commonly known as: Allegra Allergy Take 1 tablet (180 mg total) by mouth daily.   Fish Oil 1200 MG Caps Take 1,200 mg by mouth at bedtime.   hydrOXYzine 10 MG tablet Commonly known as:  ATARAX Take 10 mg by mouth 3 (three) times daily as needed.   hyoscyamine 0.125 MG Tbdp disintergrating tablet Commonly known as: ANASPAZ Place 1 tablet (0.125 mg total) under the tongue 2 (two) times daily as needed. What changed: reasons to take this   lidocaine 5 % Commonly known as: LIDODERM Place 1 patch onto the skin as needed (pain). Remove & Discard patch within 12 hours or as directed by MD   LORazepam 1 MG tablet Commonly known as: ATIVAN Take 1 tablet (1 mg total) by mouth 2 (two) times daily. May take extra 3rd dose if needed What changed:  when to take this reasons to take this   magnesium gluconate 500 MG tablet Commonly known as: MAGONATE Take 500 mg by mouth daily as needed (constipation).   meclizine 25 MG tablet Commonly known as: ANTIVERT Take 1 tablet (25 mg total) by mouth 3 (three) times daily as needed for dizziness. 3 month supply What changed: when to take this   metoprolol succinate 50 MG 24 hr tablet Commonly known as: TOPROL-XL Take 1 tablet (50 mg total) by mouth daily with breakfast. What changed: how much to take   montelukast 10 MG tablet Commonly known as: SINGULAIR TAKE ONE TABLET BY MOUTH EVERY DAY   morphine 15 MG tablet Commonly known as: MSIR Take 15 mg by mouth every 4 (four) hours as needed for severe pain.   morphine 30 MG 12 hr tablet Commonly known as: MS CONTIN Take 1 tablet by mouth every 12 (twelve) hours.   Nebulizer Devi Use as directed   OXYGEN Inhale 2.5 L/hr into the lungs continuous.   pantoprazole 40 MG tablet Commonly known as: PROTONIX TAKE 1 TABLET (40 MG TOTAL) BY MOUTH DAILY.   Polyethyl Glycol-Propyl Glycol 0.4-0.3 % Soln Apply 1 drop to eye daily as needed (dry eyes).   pravastatin 40 MG tablet Commonly known as: PRAVACHOL Take 1 tablet (40 mg total) by mouth at bedtime.   promethazine 25 MG tablet Commonly known as: PHENERGAN Take 25 mg by mouth every 8 (eight) hours as needed for nausea or  vomiting.   TART CHERRY ADVANCED PO Take 5 mLs by mouth daily. Juice Concentrate- 1tsp daily   Zovirax 5 % Generic drug: acyclovir ointment Apply 1 application topically as needed (flair).        Allergies:  Allergies  Allergen Reactions   Epinephrine Palpitations    Raises heart rate    Flonase [Fluticasone] Palpitations   Protriptyline Hcl Other (See Comments)    Severe constipation   Tamiflu [Oseltamivir Phosphate] Other (See Comments)    "like I was having a stroke"   Tamiflu [Oseltamivir] Anaphylaxis   Tizanidine Other (See Comments)    panic   Gabapentin Other (See Comments)    arthralgia Other reaction(s): Arthralgia (Joint Pain)   Other Hives and  Rash    boiron acteane   Qvar [Beclomethasone] Other (See Comments)   Spiriva [Tiotropium Bromide Monohydrate] Rash   Tiotropium Rash   Zonegran Other (See Comments)    Mood swings   Advair Diskus [Fluticasone-Salmeterol] Rash   Baclofen Rash   Chlorzoxazone Rash   Lamotrigine Rash   Levofloxacin Rash   Protriptyline Rash   Ropinirole Rash   Tiotropium Bromide Monohydrate Rash   Verapamil Rash    Past Medical History, Surgical history, Social history, and Family History were reviewed and updated.  Review of Systems: All other 10 point review of systems is negative.   Physical Exam:  vitals were not taken for this visit.   Wt Readings from Last 3 Encounters:  06/07/22 193 lb (87.5 kg)  04/17/22 195 lb 1.9 oz (88.5 kg)  03/06/22 198 lb (89.8 kg)    Ocular: Sclerae unicteric, pupils equal, round and reactive to light Ear-nose-throat: Oropharynx clear, dentition fair Lymphatic: No cervical or supraclavicular adenopathy Lungs no rales or rhonchi, good excursion bilaterally Heart regular rate and rhythm, no murmur appreciated Abd soft, nontender, positive bowel sounds MSK no focal spinal tenderness, no joint edema Neuro: non-focal, well-oriented, appropriate affect Breasts: Deferred   Lab Results   Component Value Date   WBC 5.1 07/22/2022   HGB 11.8 (L) 07/22/2022   HCT 39.3 07/22/2022   MCV 77.8 (L) 07/22/2022   PLT 269 07/22/2022   Lab Results  Component Value Date   FERRITIN 5 (L) 06/07/2022   IRON 71 06/07/2022   TIBC 497 (H) 06/07/2022   UIBC 426 06/07/2022   IRONPCTSAT 14 06/07/2022   Lab Results  Component Value Date   RETICCTPCT 1.1 04/27/2015   RBC 5.05 07/22/2022   RETICCTABS 55.1 04/27/2015   No results found for: "KPAFRELGTCHN", "LAMBDASER", "KAPLAMBRATIO" No results found for: "IGGSERUM", "IGA", "IGMSERUM" No results found for: "TOTALPROTELP", "ALBUMINELP", "A1GS", "A2GS", "BETS", "BETA2SER", "GAMS", "MSPIKE", "SPEI"   Chemistry      Component Value Date/Time   NA 138 06/07/2022 1412   NA 147 (H) 10/08/2017 1410   NA 141 02/17/2017 1132   K 4.6 06/07/2022 1412   K 4.6 10/08/2017 1410   K 5.3 (H) 02/17/2017 1132   CL 106 06/07/2022 1412   CL 106 10/08/2017 1410   CO2 26 06/07/2022 1412   CO2 30 10/08/2017 1410   CO2 29 02/17/2017 1132   BUN 24 (H) 06/07/2022 1412   BUN 15 10/08/2017 1410   BUN 17.8 02/17/2017 1132   CREATININE 0.86 06/07/2022 1412   CREATININE 1.0 10/08/2017 1410   CREATININE 0.8 02/17/2017 1132      Component Value Date/Time   CALCIUM 9.4 06/07/2022 1412   CALCIUM 9.0 10/08/2017 1410   CALCIUM 9.3 02/17/2017 1132   ALKPHOS 62 06/07/2022 1412   ALKPHOS 59 10/08/2017 1410   ALKPHOS 79 02/17/2017 1132   AST 23 06/07/2022 1412   AST 22 02/17/2017 1132   ALT 18 06/07/2022 1412   ALT 26 10/08/2017 1410   ALT 20 02/17/2017 1132   BILITOT 0.5 06/07/2022 1412   BILITOT 0.39 02/17/2017 1132       Impression and Plan: Ms. Vanpelt is a very pleasant 71 yo caucasian female with secondary polycythemia (JAK-2 negative) due to COPD. Phlebotomy for Hct 39.3%.  Follow-up in 6 weeks.   Lottie Dawson, NP 8/21/202310:18 AM

## 2022-07-31 NOTE — Progress Notes (Deleted)
HPI female never smoker followed for asthma, history pneumonia, cough, complicated by past history for thoracic outlet syndrome, chronic hypoxic respiratory failure, complicated by CVA, GERD, allergic rhinitis, polycythemia, depression O2 2 L sleep and prn/Advanced  Nl PFT 2012 except DLCO 62% Allergy profile was NEG, 4.9 total IgE GI/ Dr Paulita Fujita: Ba swallow> stricture and probably mild aspiration Residual right diaphragm elevation after surgery for right thoracic outlet syndrome --------------------------------------------------------------------------------  08/02/21- 71 year old female never smoker followed for asthma, history pneumonia, cough, chronic hypoxic respiratory failure,  past history for thoracic outlet syndrome, chronic elevation right diaphragm,complicated by CVA, GERD, allergic rhinitis, Polycythemia/ Phlebotomy, depression O2 2-3 L sleep and prn/Adapt   POC = Inogen. -Albuterol hfa, neb albuterol, benzonatate, allegra,  Covid vax- 3 Moderna -----No complaint's Continues O2 for sleep and still needs occ phlebotomy for polycythemia. Denies acute respiratory changes.   08/02/22- 71 year old female never smoker followed for asthma, history pneumonia, cough, chronic hypoxic respiratory failure,  past history for thoracic outlet syndrome, chronic elevation right diaphragm,complicated by CVA, GERD, allergic rhinitis, Polycythemia/ Phlebotomy, depression O2 2-3 L sleep and prn/Adapt   POC = Inogen. -Albuterol hfa, neb albuterol, benzonatate, allegra,  Covid vax- 3 Moderna  Review of Systems- See HPI   + = positive Constitutional:   No-   weight loss, night sweats, fevers, chills, fatigue, lassitude. HEENT:   frequent  headaches,  Some difficulty swallowing,, sore throat,       No-  Sneezing,+ itching, ear ache, nasal congestion, post nasal drip,  CV:  No-   chest pain, orthopnea, PND, swelling in lower extremities, anasarca, dizziness, palpitations Resp: + shortness of breath with  exertion or at rest. productive cough              No-  coughing up of blood.              No-  change in color of mucus.   wheezing.   Skin: No-   rash or lesions. GI:  No-   heartburn, indigestion, abdominal pain, nausea, vomiting, GU:  MS:  No-   joint pain or swelling.   Neuro- + tremor and poor balance Psych:  No- change in mood or affect. No depression or anxiety.  No memory loss.    Objective:   Physical Exam General- Alert, Oriented, Affect+ tearful, Distress- none acute,  Overweight.  + Rolling walker              O2 POC 2L. Skin- rash-none, lesions- none, excoriation- none Lymphadenopathy- none Head- atraumatic            Eyes- Gross vision intact, PERRLA, conjunctivae clear secretions            Ears- Hearing, canals            Nose- Clear, No- Septal dev, mucus, polyps, erosion, perforation             Throat- Mallampati III , mucosa clear , drainage- none, tonsils- atrophic, Neck- flexible , trachea midline, no stridor , thyroid nl, carotid no bruit Chest - symmetrical excursion , unlabored           Heart/CV- RRR , no murmur , no gallop  , no rub, nl s1 s2                           - JVD- none , edema- none, stasis changes- none, varices- none  Lung- clear to P&A, wheeze-none, cough-none,                                                    dullness-none, rub- none, unlabored           Chest wall- +Vascular surgery scar Right Upper Anterior chest Abd- Br/ Gen/ Rectal- Not done, not indicated Extrem- cyanosis- none, clubbing, none, atrophy- none, strength- . +Rolling walker Neuro- + some word searching

## 2022-08-02 ENCOUNTER — Ambulatory Visit: Payer: Medicare HMO | Admitting: Internal Medicine

## 2022-08-07 ENCOUNTER — Ambulatory Visit: Payer: Medicare HMO | Admitting: Cardiology

## 2022-08-26 NOTE — Progress Notes (Deleted)
HPI female never smoker followed for asthma, history pneumonia, cough, complicated by past history for thoracic outlet syndrome, chronic hypoxic respiratory failure, complicated by CVA, GERD, allergic rhinitis, polycythemia, depression O2 2 L sleep and prn/Advanced  Nl PFT 2012 except DLCO 62% Allergy profile was NEG, 4.9 total IgE GI/ Dr Paulita Fujita: Ba swallow> stricture and probably mild aspiration Residual right diaphragm elevation after surgery for right thoracic outlet syndrome --------------------------------------------------------------------------------   08/02/21- 72 year old female never smoker followed for asthma, history pneumonia, cough, chronic hypoxic respiratory failure,  past history for thoracic outlet syndrome, chronic elevation right diaphragm,complicated by CVA, GERD, allergic rhinitis, Polycythemia/ Phlebotomy, depression O2 2-3 L sleep and prn/Adapt   POC = Inogen. -Albuterol hfa, neb albuterol, benzonatate, allegra,  Covid vax- 3 Moderna -----No complaint's Continues O2 for sleep and still needs occ phlebotomy for polycythemia. Denies acute respiratory changes.   08/27/22- 71 year old female never smoker followed for asthma, history pneumonia, cough, chronic hypoxic respiratory failure,  past history for thoracic outlet syndrome, chronic elevation right diaphragm,complicated by CVA, GERD, allergic rhinitis, Polycythemia/ Phlebotomy, Depression O2 2-3 L sleep and prn/Adapt   POC = Inogen. -Albuterol hfa, neb albuterol, benzonatate, allegra, Beconase-AQ,  Covid vax- 3 Moderna Flu vax-   Review of Systems- See HPI   + = positive Constitutional:   No-   weight loss, night sweats, fevers, chills, fatigue, lassitude. HEENT:   frequent  headaches,  Some difficulty swallowing,, sore throat,       No-  Sneezing,+ itching, ear ache, nasal congestion, post nasal drip,  CV:  No-   chest pain, orthopnea, PND, swelling in lower extremities, anasarca, dizziness, palpitations Resp: +  shortness of breath with exertion or at rest. productive cough              No-  coughing up of blood.              No-  change in color of mucus.   wheezing.   Skin: No-   rash or lesions. GI:  No-   heartburn, indigestion, abdominal pain, nausea, vomiting, GU:  MS:  No-   joint pain or swelling.   Neuro- + tremor and poor balance Psych:  No- change in mood or affect. No depression or anxiety.  No memory loss.    Objective:   Physical Exam General- Alert, Oriented, Affect+ tearful, Distress- none acute,  Overweight.  + Rolling walker              O2 POC 2L. Skin- rash-none, lesions- none, excoriation- none Lymphadenopathy- none Head- atraumatic            Eyes- Gross vision intact, PERRLA, conjunctivae clear secretions            Ears- Hearing, canals            Nose- Clear, No- Septal dev, mucus, polyps, erosion, perforation             Throat- Mallampati III , mucosa clear , drainage- none, tonsils- atrophic, Neck- flexible , trachea midline, no stridor , thyroid nl, carotid no bruit Chest - symmetrical excursion , unlabored           Heart/CV- RRR , no murmur , no gallop  , no rub, nl s1 s2                           - JVD- none , edema- none, stasis changes- none, varices- none  Lung- clear to P&A, wheeze-none, cough-none,                                                    dullness-none, rub- none, unlabored           Chest wall- +Vascular surgery scar Right Upper Anterior chest Abd- Br/ Gen/ Rectal- Not done, not indicated Extrem- cyanosis- none, clubbing, none, atrophy- none, strength- . +Rolling walker Neuro- + some word searching

## 2022-08-27 ENCOUNTER — Ambulatory Visit: Payer: Medicare HMO | Admitting: Internal Medicine

## 2022-08-27 ENCOUNTER — Telehealth: Payer: Self-pay | Admitting: Internal Medicine

## 2022-08-28 NOTE — Progress Notes (Signed)
Requested notes and labs

## 2022-08-30 NOTE — Telephone Encounter (Signed)
Patients husband called back and states that she will have enough meds to last until her appointment on 10/17

## 2022-08-30 NOTE — Telephone Encounter (Signed)
Called patient but she did not answer. Left message for patient to call back.  

## 2022-08-30 NOTE — Telephone Encounter (Signed)
Noted and will give refills at visit

## 2022-09-02 ENCOUNTER — Inpatient Hospital Stay: Payer: Medicare HMO | Admitting: Family

## 2022-09-02 ENCOUNTER — Inpatient Hospital Stay: Payer: Medicare HMO

## 2022-09-04 NOTE — Progress Notes (Deleted)
Cardiology Clinic Note   Patient Name: Natalie Hendrix Date of Encounter: 09/04/2022  Primary Care Provider:  Hayden Rasmussen, MD Primary Cardiologist:  None  Patient Profile    71 year old female with a past medical history of polycythemia vera, hyperlipidemia, chronic oxygen dependent COPD/chronic dyspnea, smoker, and CVA, palpitations, who is here today to follow-up on her hyperlipidemia and palpitations.  Past Medical History    Past Medical History:  Diagnosis Date   Allergic rhinitis    Allergy    Anxiety    Asthma    uses oxygen 2.5 l/m 24/7, sleeps elevated   Complication of anesthesia    very sensitive to sedatives, B/P drops   CVA (cerebral vascular accident) ([place]) 1999, 2014   residual left side weakness, dysphagia, word finding difficulty, and short term memory; uses mobile chair, cane   Dependence on supplemental oxygen    Depression    Dizziness    Fibromyalgia    Fluttering heart    GERD (gastroesophageal reflux disease)    Hyperlipidemia    Hypertension    Migraine headache    Migraine, unspecified, not intractable, without status migrainosus    Other amnesia    Polycythemia rubra vera (HCC)    Radiculopathy, site unspecified    Thoracic outlet syndrome 1981   repair   TIA (transient ischemic attack)    Unspecified chronic bronchitis ([place])    Unspecified dementia without behavioral disturbance    Vertigo    Past Surgical History:  Procedure Laterality Date   APPENDECTOMY     BALLOON DILATION N/A 10/27/2013   Procedure: BALLOON DILATION;  Surgeon: Arta Silence, MD;  Location: [facility];  Service: Endoscopy;  Laterality: N/A;   BREAST SURGERY  1979   CHOLECYSTECTOMY     CYST REMOVAL HAND Bilateral    left done 10-13-13(Dr. Gramig)   DILATION AND CURETTAGE OF UTERUS     ESOPHAGOGASTRODUODENOSCOPY (EGD) WITH PROPOFOL N/A 10/27/2013   Procedure: ESOPHAGOGASTRODUODENOSCOPY (EGD) WITH PROPOFOL;  Surgeon: Arta Silence, MD;  Location: [facility]  ENDOS[facility];  Service: Endoscopy;  Laterality: N/A;   GALLBLADDER SURGERY     KNEE SURGERY     repair torn ligament/capsule knee cruciat   REDUCTION MAMMAPLASTY     RESECTION RIB PARTIAL     RHINOPLASTY     Root Resection and Revascularization  1980   of Long thoracic artery   scalenectomy     synonectomy     TRANSTHORACIC ECHOCARDIOGRAM  12/2019   Normal LV function.  EF 66 5%.  Grade 1 diastolic dysfunction.  Normal echocardiogram for age    Allergies  Allergies  Allergen Reactions   Epinephrine Palpitations    Raises heart rate    Flonase [Fluticasone] Palpitations   Protriptyline Hcl Other (See Comments)    Severe constipation   Tamiflu [Oseltamivir Phosphate] Other (See Comments)    "like I was having a stroke"   Tamiflu [Oseltamivir] Anaphylaxis   Tizanidine Other (See Comments)    panic   Gabapentin Other (See Comments)    arthralgia Other reaction(s): Arthralgia (Joint Pain)   Other Hives and Rash    boiron acteane   Qvar [Beclomethasone] Other (See Comments)   Spiriva [Tiotropium Bromide Monohydrate] Rash   Tiotropium Rash   Zonegran Other (See Comments)    Mood swings   Advair Diskus [Fluticasone-Salmeterol] Rash   Baclofen Rash   Chlorzoxazone Rash   Lamotrigine Rash   Levofloxacin Rash   Protriptyline Rash   Ropinirole Rash  Tiotropium Bromide Monohydrate Rash   Verapamil Rash    History of Present Illness    [name] is a 71 year old female with a past medical history significant for polycythemia very, hyperlipidemia, hypertension, chronic oxygen dependent COPD/dyspnea, asthma, CVA, and palpitations.  She was last seen 12/30/2019 by Dr. Ellyn Hack.  Overall she stated that she was doing relatively well she has not had any significant chest pain or chest pressure.  She did continue to have her exertional dyspnea and notes that her oxygen her levels continue to drop into the 80s.  There were no medication changes made at the time of her appointment.  She  did have an echocardiogram that was previously ordered.  Echocardiogram completed 12/08/2019 revealed LVEF 60-65%, borderline left ventricular hypertrophy, no regional wall motion abnormalities, no valvular disease noted.  She returns to clinic today   Home Medications    Current Outpatient Medications  Medication Sig Dispense Refill   albuterol (PROAIR HFA) 108 (90 Base) MCG/ACT inhaler Inhale 2 puffs into the lungs every 6 (six) hours as needed for wheezing or shortness of breath. 8 g 5   albuterol (PROVENTIL) (2.5 MG/3ML) 0.083% nebulizer solution Take 3 mLs (2.5 mg total) by nebulization every 6 (six) hours as needed for wheezing or shortness of breath. 1080 mL 2   azelastine (ASTELIN) 0.1 % nasal spray Place 1 spray into both nostrils 2 (two) times daily. 90 mL 3   beclomethasone (BECONASE-AQ) 42 MCG/SPRAY nasal spray 2 puffs each nostril once daily 75 g 3   benzonatate (TESSALON) 100 MG capsule Take 1 capsule (100 mg total) by mouth every 6 (six) hours as needed for cough. 360 capsule 3   buPROPion (WELLBUTRIN XL) 300 MG 24 hr tablet Take 1 tablet (300 mg total) by mouth daily with breakfast. 90 tablet 3   Calcium Carbonate-Vit D-Min (CALCIUM 1200 PO) Take 1,250 mg by mouth daily.      clopidogrel (PLAVIX) 75 MG tablet Take 1 tablet (75 mg total) by mouth daily. 90 tablet 1   cyclobenzaprine (FLEXERIL) 10 MG tablet Take 10 mg by mouth every 8 (eight) hours as needed.     ESTRACE VAGINAL 0.1 MG/GM vaginal cream Place 1 Applicatorful vaginally daily. Small amount each dose per pt     ezetimibe (ZETIA) 10 MG tablet TAKE 1 TABLET BY MOUTH DAILY. 90 tablet 3   fexofenadine (ALLEGRA ALLERGY) 180 MG tablet Take 1 tablet (180 mg total) by mouth daily. 90 tablet 1   hydrOXYzine (ATARAX/VISTARIL) 10 MG tablet Take 10 mg by mouth 3 (three) times daily as needed.     hyoscyamine (ANASPAZ) 0.125 MG TBDP disintergrating tablet Place 1 tablet (0.125 mg total) under the tongue 2 (two) times daily as  needed. (Patient taking differently: Place 0.125 mg under the tongue 2 (two) times daily as needed for bladder spasms or cramping.) 180 tablet 0   lidocaine (LIDODERM) 5 % Place 1 patch onto the skin as needed (pain). Remove & Discard patch within 12 hours or as directed by MD     LORazepam (ATIVAN) 1 MG tablet Take 1 tablet (1 mg total) by mouth 2 (two) times daily. May take extra 3rd dose if needed (Patient taking differently: Take 1 mg by mouth daily as needed. May take extra 3rd dose if needed) 75 tablet 0   magnesium gluconate (MAGONATE) 500 MG tablet Take 500 mg by mouth daily as needed (constipation).     meclizine (ANTIVERT) 25 MG tablet Take 1 tablet (25 mg total)  by mouth 3 (three) times daily as needed for dizziness. 3 month supply (Patient taking differently: Take 25 mg by mouth 3 (three) times daily. 3 month supply) 60 tablet 0   metoprolol succinate (TOPROL-XL) 50 MG 24 hr tablet Take 1 tablet (50 mg total) by mouth daily with breakfast. (Patient taking differently: Take 75 mg by mouth daily with breakfast.) 90 tablet 1   morphine (MS CONTIN) 30 MG 12 hr tablet Take 1 tablet by mouth every 12 (twelve) hours.     morphine (MSIR) 15 MG tablet Take 15 mg by mouth every 4 (four) hours as needed for severe pain.     Multiple Vitamins-Minerals (CENTRUM SILVER) tablet Take 1 tablet by mouth daily.     Omega-3 Fatty Acids (FISH OIL) 1200 MG CAPS Take 1,200 mg by mouth at bedtime.      OXYGEN Inhale 2.5 L/hr into the lungs continuous.     pantoprazole (PROTONIX) 40 MG tablet TAKE 1 TABLET (40 MG TOTAL) BY MOUTH DAILY. 90 tablet 3   Polyethyl Glycol-Propyl Glycol 0.4-0.3 % SOLN Apply 1 drop to eye daily as needed (dry eyes).      pravastatin (PRAVACHOL) 40 MG tablet Take 1 tablet (40 mg total) by mouth at bedtime. 90 tablet 3   promethazine (PHENERGAN) 25 MG tablet Take 25 mg by mouth every 8 (eight) hours as needed for nausea or vomiting.   1   Respiratory Therapy Supplies (NEBULIZER) DEVI Use as  directed 1 each 0   ZOVIRAX 5 % Apply 1 application topically as needed (flair).      No current facility-administered medications for this visit.   Facility-Administered Medications Ordered in Other Visits  Medication Dose Route Frequency Provider Last Rate Last Admin   0.9 %  sodium chloride infusion  1,000 mL Intravenous Once Celso Amy, NP         Family History    Family History  Problem Relation Age of Onset   Cervical cancer Mother    Heart disease Mother    Hypertension Mother    Cancer Mother    Clotting disorder Mother        and aunts x 2   Irregular heart beat Mother    Stroke Other        aunt   Hypertension Other        aunt   Stroke Other        uncle   Heart attack Other        uncle & Aunt   Emphysema Other        aunt   Emphysema Father    Other Father        "BP"   Lung cancer Father        cause of death.   Skin cancer Father    Heart disease Father    Allergies Other        aunt and sister   Rheum arthritis Other        grandmother   Other Other        polycythemia possible in 2 aunts   Other Other        aunt with brain tumor   She indicated that her mother is deceased. She indicated that her father is deceased.  Social History    Social History   Socioeconomic History   Marital status: Married    Spouse name: Not on file   Number of children: N   Years of education: Not on  file   Highest education level: Master's degree (e.g., MA, MS, MEng, MEd, MSW, MBA)  Occupational History   Occupation: RETIRED    Employer: RETIRED    Comment: occupational hand therapist  Tobacco Use   Smoking status: Never   Smokeless tobacco: Never   Tobacco comments:    never used tobacco; secondary smoke exposure  Vaping Use   Vaping Use: Never used  Substance and Sexual Activity   Alcohol use: Not Currently    Alcohol/week: 0.0 standard drinks of alcohol    Comment: seldom, wine @ christmas   Drug use: No   Sexual activity: Not on file   Other Topics Concern   Not on file  Social History Narrative   Lives at home with husband  Engineer, technical sales) and service dog   Right handed   Caffeine: tries to avoid    Social Determinants of Health   Financial Resource Strain: Not on file  Food Insecurity: Not on file  Transportation Needs: Not on file  Physical Activity: Not on file  Stress: Not on file  Social Connections: Not on file  Intimate Partner Violence: Not on file     Review of Systems    General:  No chills, fever, night sweats or weight changes.  Cardiovascular:  No chest pain, dyspnea on exertion, edema, orthopnea, palpitations, paroxysmal nocturnal dyspnea. Dermatological: No rash, lesions/masses Respiratory: No cough, dyspnea Urologic: No hematuria, dysuria Abdominal:   No nausea, vomiting, diarrhea, bright red blood per rectum, melena, or hematemesis Neurologic:  No visual changes, wkns, changes in mental status. All other systems reviewed and are otherwise negative except as noted above.     Physical Exam    VS:  There were no vitals taken for this visit. , BMI There is no height or weight on file to calculate BMI.     GEN: Well nourished, well developed, in no acute distress. HEENT: normal. Neck: Supple, no JVD, carotid bruits, or masses. Cardiac: RRR, no murmurs, rubs, or gallops. No clubbing, cyanosis, edema.  Radials/DP/PT 2+ and equal bilaterally.  Respiratory:  Respirations regular and unlabored, clear to auscultation bilaterally. GI: Soft, nontender, nondistended, BS + x 4. MS: no deformity or atrophy. Skin: warm and dry, no rash. Neuro:  Strength and sensation are intact. Psych: Normal affect.  Accessory Clinical Findings    ECG personally reviewed by me today- *** - No acute changes  Lab Results  Component Value Date   WBC 5.1 07/22/2022   HGB 11.8 (L) 07/22/2022   HCT 39.3 07/22/2022   MCV 77.8 (L) 07/22/2022   PLT 269 07/22/2022   Lab Results  Component Value Date   CREATININE 0.73  07/22/2022   BUN 17 07/22/2022   NA 142 07/22/2022   K 4.2 07/22/2022   CL 106 07/22/2022   CO2 29 07/22/2022   Lab Results  Component Value Date   ALT 23 07/22/2022   AST 30 07/22/2022   ALKPHOS 54 07/22/2022   BILITOT 0.3 07/22/2022   Lab Results  Component Value Date   CHOL 152 02/15/2021   HDL 58 02/15/2021   LDLCALC 58 02/15/2021   TRIG 181 (H) 02/15/2021   CHOLHDL 2.6 02/15/2021    Lab Results  Component Value Date   HGBA1C 5.9 10/17/2016    Assessment & Plan   1.  ***  Trinette Vera, NP 09/04/2022, 6:25 PM

## 2022-09-05 ENCOUNTER — Ambulatory Visit: Payer: Medicare HMO | Admitting: Cardiology

## 2022-09-10 ENCOUNTER — Encounter: Payer: Self-pay | Admitting: Medical Oncology

## 2022-09-10 ENCOUNTER — Inpatient Hospital Stay: Payer: Medicare HMO

## 2022-09-10 ENCOUNTER — Inpatient Hospital Stay: Payer: Medicare HMO | Attending: Hematology & Oncology

## 2022-09-10 ENCOUNTER — Inpatient Hospital Stay (HOSPITAL_BASED_OUTPATIENT_CLINIC_OR_DEPARTMENT_OTHER): Payer: Medicare HMO | Admitting: Medical Oncology

## 2022-09-10 VITALS — BP 113/83 | HR 76 | Temp 98.6°F | Resp 18 | Wt 194.0 lb

## 2022-09-10 DIAGNOSIS — D751 Secondary polycythemia: Secondary | ICD-10-CM | POA: Insufficient documentation

## 2022-09-10 DIAGNOSIS — D500 Iron deficiency anemia secondary to blood loss (chronic): Secondary | ICD-10-CM | POA: Diagnosis not present

## 2022-09-10 DIAGNOSIS — D45 Polycythemia vera: Secondary | ICD-10-CM

## 2022-09-10 DIAGNOSIS — Z7902 Long term (current) use of antithrombotics/antiplatelets: Secondary | ICD-10-CM | POA: Diagnosis not present

## 2022-09-10 LAB — CMP (CANCER CENTER ONLY)
ALT: 16 U/L (ref 0–44)
AST: 25 U/L (ref 15–41)
Albumin: 4.1 g/dL (ref 3.5–5.0)
Alkaline Phosphatase: 56 U/L (ref 38–126)
Anion gap: 7 (ref 5–15)
BUN: 17 mg/dL (ref 8–23)
CO2: 28 mmol/L (ref 22–32)
Calcium: 9.4 mg/dL (ref 8.9–10.3)
Chloride: 106 mmol/L (ref 98–111)
Creatinine: 0.82 mg/dL (ref 0.44–1.00)
GFR, Estimated: >60 mL/min (ref >60)
Glucose, Bld: 87 mg/dL (ref 70–99)
Potassium: 4.2 mmol/L (ref 3.5–5.1)
Sodium: 141 mmol/L (ref 135–145)
Total Bilirubin: 0.5 mg/dL (ref 0.3–1.2)
Total Protein: 6.6 g/dL (ref 6.5–8.1)

## 2022-09-10 LAB — CBC WITH DIFFERENTIAL (CANCER CENTER ONLY)
Abs Immature Granulocytes: 0.01 10*3/uL (ref 0.00–0.07)
Basophils Absolute: 0.1 10*3/uL (ref 0.0–0.1)
Basophils Relative: 1 %
Eosinophils Absolute: 0.2 10*3/uL (ref 0.0–0.5)
Eosinophils Relative: 3 %
HCT: 37.6 % (ref 36.0–46.0)
Hemoglobin: 11 g/dL — ABNORMAL LOW (ref 12.0–15.0)
Immature Granulocytes: 0 %
Lymphocytes Relative: 27 %
Lymphs Abs: 1.5 10*3/uL (ref 0.7–4.0)
MCH: 22 pg — ABNORMAL LOW (ref 26.0–34.0)
MCHC: 29.3 g/dL — ABNORMAL LOW (ref 30.0–36.0)
MCV: 75 fL — ABNORMAL LOW (ref 80.0–100.0)
Monocytes Absolute: 0.6 10*3/uL (ref 0.1–1.0)
Monocytes Relative: 10 %
Neutro Abs: 3.1 10*3/uL (ref 1.7–7.7)
Neutrophils Relative %: 59 %
Platelet Count: 247 10*3/uL (ref 150–400)
RBC: 5.01 MIL/uL (ref 3.87–5.11)
RDW: 15.6 % — ABNORMAL HIGH (ref 11.5–15.5)
WBC Count: 5.4 10*3/uL (ref 4.0–10.5)
nRBC: 0 % (ref 0.0–0.2)

## 2022-09-10 LAB — IRON AND IRON BINDING CAPACITY (CC-WL,HP ONLY)
Iron: 27 ug/dL — ABNORMAL LOW (ref 28–170)
Saturation Ratios: 5 % — ABNORMAL LOW (ref 10.4–31.8)
TIBC: 538 ug/dL — ABNORMAL HIGH (ref 250–450)
UIBC: 511 ug/dL — ABNORMAL HIGH (ref 148–442)

## 2022-09-10 LAB — FERRITIN: Ferritin: 4 ng/mL — ABNORMAL LOW (ref 11–307)

## 2022-09-10 NOTE — Progress Notes (Signed)
Hematology and Oncology Follow Up Visit  Natalie Hendrix [id number] 02/13/1951 71 y.o. 09/10/2022   Principle Diagnosis:  Secondary polycythemia - JAK2 negative   Current Therapy:        Phlebotomy to maintain hematocrit less than 38% Plavix 75 mg by mouth daily   Interim History:  Natalie Hendrix is here today with her husband for follow-up.   Today she reports that she has been doing and feeling well.  Usually gets symptomatic when her HCT is elevated with migraines and itching- none recently Using her cane for her unsteadiness which is stable to improved.  No blood loss, excessive bruises, bloody stools, hemoptysis, hematuria on her plavix  No fever, chills, n/v, cough, rash, chest pain, palpitations, abdominal pain or changes in bowel or bladder habits.  She has chronic back pain that waxes and wanes. This is unchanged today  Neuropathy in her lower extremities is unchanged from baseline.  Appetite and hydration have been good. Weight shown below  Wt Readings from Last 3 Encounters:  09/10/22 194 lb 0.6 oz (88 kg)  07/22/22 193 lb 0.6 oz (87.6 kg)  06/07/22 193 lb (87.5 kg)     ECOG Performance Status: 1 - Symptomatic but completely ambulatory  Medications:  Allergies as of 09/10/2022       Reactions   Epinephrine Palpitations   Raises heart rate   Flonase [fluticasone] Palpitations   Protriptyline Hcl Other (See Comments)   Severe constipation   Tamiflu [oseltamivir Phosphate] Other (See Comments)   "like I was having a stroke"   Tamiflu [oseltamivir] Anaphylaxis   Tizanidine Other (See Comments)   panic   Gabapentin Other (See Comments)   arthralgia Other reaction(s): Arthralgia (Joint Pain)   Other Hives, Rash   boiron acteane   Qvar [beclomethasone] Other (See Comments)   Spiriva [tiotropium Bromide Monohydrate] Rash   Tiotropium Rash   Zonegran Other (See Comments)   Mood swings   Advair Diskus [fluticasone-salmeterol] Rash   Baclofen Rash   Chlorzoxazone  Rash   Lamotrigine Rash   Levofloxacin Rash   Protriptyline Rash   Ropinirole Rash   Tiotropium Bromide Monohydrate Rash   Verapamil Rash        Medication List        Accurate as of September 10, 2022 11:16 AM. If you have any questions, ask your nurse or doctor.          albuterol 108 (90 Base) MCG/ACT inhaler Commonly known as: ProAir HFA Inhale 2 puffs into the lungs every 6 (six) hours as needed for wheezing or shortness of breath.   albuterol (2.5 MG/3ML) 0.083% nebulizer solution Commonly known as: PROVENTIL Take 3 mLs (2.5 mg total) by nebulization every 6 (six) hours as needed for wheezing or shortness of breath.   azelastine 0.1 % nasal spray Commonly known as: ASTELIN Place 1 spray into both nostrils 2 (two) times daily.   beclomethasone 42 MCG/SPRAY nasal spray Commonly known as: BECONASE-AQ 2 puffs each nostril once daily   benzonatate 100 MG capsule Commonly known as: TESSALON Take 1 capsule (100 mg total) by mouth every 6 (six) hours as needed for cough.   buPROPion 300 MG 24 hr tablet Commonly known as: WELLBUTRIN XL Take 1 tablet (300 mg total) by mouth daily with breakfast.   CALCIUM 1200 PO Take 1,250 mg by mouth daily.   Centrum Silver tablet Take 1 tablet by mouth daily.   clopidogrel 75 MG tablet Commonly known as: PLAVIX Take 1 tablet (75  mg total) by mouth daily.   cyclobenzaprine 10 MG tablet Commonly known as: FLEXERIL Take 10 mg by mouth every 8 (eight) hours as needed.   ESTRACE VAGINAL 0.1 MG/GM vaginal cream Generic drug: estradiol Place 1 Applicatorful vaginally daily. Small amount each dose per pt   ezetimibe 10 MG tablet Commonly known as: Zetia TAKE 1 TABLET BY MOUTH DAILY.   fexofenadine 180 MG tablet Commonly known as: Allegra Allergy Take 1 tablet (180 mg total) by mouth daily.   Fish Oil 1200 MG Caps Take 1,200 mg by mouth at bedtime.   hydrOXYzine 10 MG tablet Commonly known as: ATARAX Take 10 mg by mouth  3 (three) times daily as needed.   hyoscyamine 0.125 MG Tbdp disintergrating tablet Commonly known as: ANASPAZ Place 1 tablet (0.125 mg total) under the tongue 2 (two) times daily as needed. What changed: reasons to take this   lidocaine 5 % Commonly known as: LIDODERM Place 1 patch onto the skin as needed (pain). Remove & Discard patch within 12 hours or as directed by MD   LORazepam 1 MG tablet Commonly known as: ATIVAN Take 1 tablet (1 mg total) by mouth 2 (two) times daily. May take extra 3rd dose if needed What changed:  when to take this reasons to take this   magnesium gluconate 500 MG tablet Commonly known as: MAGONATE Take 500 mg by mouth daily as needed (constipation).   meclizine 25 MG tablet Commonly known as: ANTIVERT Take 1 tablet (25 mg total) by mouth 3 (three) times daily as needed for dizziness. 3 month supply What changed: when to take this   metoprolol succinate 50 MG 24 hr tablet Commonly known as: TOPROL-XL Take 1 tablet (50 mg total) by mouth daily with breakfast. What changed: how much to take   morphine 15 MG tablet Commonly known as: MSIR Take 15 mg by mouth every 4 (four) hours as needed for severe pain.   morphine 30 MG 12 hr tablet Commonly known as: MS CONTIN Take 1 tablet by mouth every 12 (twelve) hours.   Nebulizer Devi Use as directed   OXYGEN Inhale 2.5 L/hr into the lungs continuous.   pantoprazole 40 MG tablet Commonly known as: PROTONIX TAKE 1 TABLET (40 MG TOTAL) BY MOUTH DAILY.   Polyethyl Glycol-Propyl Glycol 0.4-0.3 % Soln Apply 1 drop to eye daily as needed (dry eyes).   pravastatin 40 MG tablet Commonly known as: PRAVACHOL Take 1 tablet (40 mg total) by mouth at bedtime.   promethazine 25 MG tablet Commonly known as: PHENERGAN Take 25 mg by mouth every 8 (eight) hours as needed for nausea or vomiting.   Zovirax 5 % Generic drug: acyclovir ointment Apply 1 application topically as needed (flair).         Allergies:  Allergies  Allergen Reactions   Epinephrine Palpitations    Raises heart rate    Flonase [Fluticasone] Palpitations   Protriptyline Hcl Other (See Comments)    Severe constipation   Tamiflu [Oseltamivir Phosphate] Other (See Comments)    "like I was having a stroke"   Tamiflu [Oseltamivir] Anaphylaxis   Tizanidine Other (See Comments)    panic   Gabapentin Other (See Comments)    arthralgia Other reaction(s): Arthralgia (Joint Pain)   Other Hives and Rash    boiron acteane   Qvar [Beclomethasone] Other (See Comments)   Spiriva [Tiotropium Bromide Monohydrate] Rash   Tiotropium Rash   Zonegran Other (See Comments)    Mood swings  Advair Diskus [Fluticasone-Salmeterol] Rash   Baclofen Rash   Chlorzoxazone Rash   Lamotrigine Rash   Levofloxacin Rash   Protriptyline Rash   Ropinirole Rash   Tiotropium Bromide Monohydrate Rash   Verapamil Rash    Past Medical History, Surgical history, Social history, and Family History were reviewed and updated.  Review of Systems: All other 10 point review of systems is negative.   Physical Exam:  weight is 194 lb 0.6 oz (88 kg). Her oral temperature is 98.6 F (37 C). Her blood pressure is 113/83 and her pulse is 76. Her respiration is 18 and oxygen saturation is 98%.   Wt Readings from Last 3 Encounters:  09/10/22 194 lb 0.6 oz (88 kg)  07/22/22 193 lb 0.6 oz (87.6 kg)  06/07/22 193 lb (87.5 kg)   Consitutional: AA x 3. Using a cane for gentle walking assistance  Ocular: Sclerae unicteric, pupils equal, round and reactive to light Ear-nose-throat: Oropharynx clear, dentition fair Lymphatic: No cervical or supraclavicular adenopathy Lungs no rales or rhonchi, good excursion bilaterally Heart regular rate and rhythm, no murmur appreciated Abd soft, nontender, positive bowel sounds MSK no focal spinal tenderness, no joint edema Neuro: non-focal, well-oriented, appropriate affect Skin: No rashes or ecchymosis  easily visualized   Lab Results  Component Value Date   WBC 5.4 09/10/2022   HGB 11.0 (L) 09/10/2022   HCT 37.6 09/10/2022   MCV 75.0 (L) 09/10/2022   PLT 247 09/10/2022   Lab Results  Component Value Date   FERRITIN 3 (L) 07/22/2022   IRON 28 07/22/2022   TIBC 539 (H) 07/22/2022   UIBC 511 (H) 07/22/2022   IRONPCTSAT 5 (L) 07/22/2022   Lab Results  Component Value Date   RETICCTPCT 1.1 04/27/2015   RBC 5.01 09/10/2022   RETICCTABS 55.1 04/27/2015   No results found for: "KPAFRELGTCHN", "LAMBDASER", "KAPLAMBRATIO" No results found for: "IGGSERUM", "IGA", "IGMSERUM" No results found for: "TOTALPROTELP", "ALBUMINELP", "A1GS", "A2GS", "BETS", "BETA2SER", "GAMS", "MSPIKE", "SPEI"   Chemistry      Component Value Date/Time   NA 141 09/10/2022 1035   NA 147 (H) 10/08/2017 1410   NA 141 02/17/2017 1132   K 4.2 09/10/2022 1035   K 4.6 10/08/2017 1410   K 5.3 (H) 02/17/2017 1132   CL 106 09/10/2022 1035   CL 106 10/08/2017 1410   CO2 28 09/10/2022 1035   CO2 30 10/08/2017 1410   CO2 29 02/17/2017 1132   BUN 17 09/10/2022 1035   BUN 15 10/08/2017 1410   BUN 17.8 02/17/2017 1132   CREATININE 0.82 09/10/2022 1035   CREATININE 1.0 10/08/2017 1410   CREATININE 0.8 02/17/2017 1132      Component Value Date/Time   CALCIUM 9.4 09/10/2022 1035   CALCIUM 9.0 10/08/2017 1410   CALCIUM 9.3 02/17/2017 1132   ALKPHOS 56 09/10/2022 1035   ALKPHOS 59 10/08/2017 1410   ALKPHOS 79 02/17/2017 1132   AST 25 09/10/2022 1035   AST 22 02/17/2017 1132   ALT 16 09/10/2022 1035   ALT 26 10/08/2017 1410   ALT 20 02/17/2017 1132   BILITOT 0.5 09/10/2022 1035   BILITOT 0.39 02/17/2017 1132      Encounter Diagnoses  Name Primary?   Polycythemia vera (HCC) Yes   Iron deficiency anemia due to chronic blood loss    Polycythemia, secondary     Impression and Plan: Natalie Hendrix is a very pleasant 71 yo caucasian female with secondary polycythemia (JAK-2 negative) due to COPD. Her plan is  phlebotomy for HCT > 38% Iron studied pending.  Today her Hct is 37.6- she is asymptomatic at this time. No phlebotomy today Follow-up in 3 weeks for MD, labs, +- phlebotomy   Hughie Closs, PA-C 10/10/202311:16 AM

## 2022-09-15 NOTE — Progress Notes (Deleted)
HPI female never smoker followed for asthma, history pneumonia, cough, complicated by past history for thoracic outlet syndrome, chronic hypoxic respiratory failure, complicated by CVA, GERD, allergic rhinitis, polycythemia, depression O2 2 L sleep and prn/Advanced  Nl PFT 2012 except DLCO 62% Allergy profile was NEG, 4.9 total IgE GI/ Dr Paulita Fujita: Ba swallow> stricture and probably mild aspiration Residual right diaphragm elevation after surgery for right thoracic outlet syndrome --------------------------------------------------------------------------------   08/02/21- 71 year old female never smoker followed for asthma, history pneumonia, cough, chronic hypoxic respiratory failure,  past history for thoracic outlet syndrome, chronic elevation right diaphragm,complicated by CVA, GERD, allergic rhinitis, Polycythemia/ Phlebotomy, depression O2 2-3 L sleep and prn/Adapt   POC = Inogen. -Albuterol hfa, neb albuterol, benzonatate, allegra,  Covid vax- 3 Moderna -----No complaint's Continues O2 for sleep and still needs occ phlebotomy for polycythemia. Denies acute respiratory changes.   09/17/22- 71 year old female never smoker followed for asthma, history pneumonia, cough, chronic hypoxic respiratory failure,  past history for thoracic outlet syndrome, chronic elevation right diaphragm,complicated by CVA, GERD, allergic rhinitis, Polycythemia/ Phlebotomy, Depression O2 2-3 L sleep and prn/Adapt   POC = Inogen. -Albuterol hfa, neb albuterol, benzonatate, allegra, Beconase-AQ,  Covid vax- 3 Moderna Flu vax-   Review of Systems- See HPI   + = positive Constitutional:   No-   weight loss, night sweats, fevers, chills, fatigue, lassitude. HEENT:   frequent  headaches,  Some difficulty swallowing,, sore throat,       No-  Sneezing,+ itching, ear ache, nasal congestion, post nasal drip,  CV:  No-   chest pain, orthopnea, PND, swelling in lower extremities, anasarca, dizziness, palpitations Resp: +  shortness of breath with exertion or at rest. productive cough              No-  coughing up of blood.              No-  change in color of mucus.   wheezing.   Skin: No-   rash or lesions. GI:  No-   heartburn, indigestion, abdominal pain, nausea, vomiting, GU:  MS:  No-   joint pain or swelling.   Neuro- + tremor and poor balance Psych:  No- change in mood or affect. No depression or anxiety.  No memory loss.    Objective:   Physical Exam General- Alert, Oriented, Affect+ tearful, Distress- none acute,  Overweight.  + Rolling walker              O2 POC 2L. Skin- rash-none, lesions- none, excoriation- none Lymphadenopathy- none Head- atraumatic            Eyes- Gross vision intact, PERRLA, conjunctivae clear secretions            Ears- Hearing, canals            Nose- Clear, No- Septal dev, mucus, polyps, erosion, perforation             Throat- Mallampati III , mucosa clear , drainage- none, tonsils- atrophic, Neck- flexible , trachea midline, no stridor , thyroid nl, carotid no bruit Chest - symmetrical excursion , unlabored           Heart/CV- RRR , no murmur , no gallop  , no rub, nl s1 s2                           - JVD- none , edema- none, stasis changes- none, varices- none  Lung- clear to P&A, wheeze-none, cough-none,                                                    dullness-none, rub- none, unlabored           Chest wall- +Vascular surgery scar Right Upper Anterior chest Abd- Br/ Gen/ Rectal- Not done, not indicated Extrem- cyanosis- none, clubbing, none, atrophy- none, strength- . +Rolling walker Neuro- + some word searching

## 2022-09-17 ENCOUNTER — Ambulatory Visit: Payer: Medicare HMO | Admitting: Internal Medicine

## 2022-09-23 NOTE — Progress Notes (Deleted)
Cardiology Office Note:    Date:  09/23/2022   ID:  Natalie Hendrix, DOB [birth year], MRN [id number]  PCP:  Hayden Rasmussen, MD  Cardiologist:  None  Electrophysiologist:  None   Referring MD: Hayden Rasmussen, MD   Chief Complaint: routine follow-up of hypertension  History of Present Illness:    Natalie Hendrix is a 71 y.o. female with a history of asthma and chronic hypoxic respiratory failure on home O2, thoracic outlet syndrome s/p remote repair ***, hypertension, hyperlipidemia, CVA, migraines, fibromyalgia, and phlebotomy dependent polycythemia vera who is followed by Dr. Ellyn Hack and presents today for routine follow-up. ***  Remote Myoview in 2011 showed no evidence of ischemia. Patient was referred to Dr. Ellyn Hack in 06/2019 for a baseline Cardiology evaluation. At that time she did reports off an don chest pain not necessarily associated with exertion. She also reported chronic dyspnea with known oxygen dependent hypoxic respiratory failure. Echo was ordered to assess for any structural abnormalities which showed LVEF of 60-65% with no regional wall motion abnormalities and mild MR. Patient was last seen by Dr. Ellyn Hack in 12/2019 at which time she was doing well overall. She was still having chronic dyspnea with exertion due to pulmonary issues but denied any significant chest pain/pressure. She also reported some palpitations which she described as skipped beats but nothing significant.   Patient presents today for follow-up. ***   Hypertension BP well controlled. *** - Continue current medications: Toprol-XL '75mg'$  daily. ***  Hyperlipidemia Last lipid panel in 01/2021: Total Cholesterol 152, Triglycerides 181, HDL 58, LDL 58. *** - Continue Pravastatin '40mg'$  daily and Zetia '10mg'$  daily. - Will repeat lipid panel and LFTs. ***  Chronic Dyspnea Chronic Hypoxic Respiratory Failure on Home O2 Patient has known asthma and oxygen dependent hypoxic respiratory failure. Echo in 06/2019  showed LVEF of 60-65%. Echo report mentions diastolic parameters were indeterminate but Dr. Ellyn Hack felt she likely had grade 1 diastolic dysfunction but this does not explain patient's dyspnea. Dyspnea not felt to be cardiac in nature. - Stable. *** - Followed by Pulmonology.  Polycythemia Vera Managed with intermittent phlebotomy when HCT > 38%. Also on Plavix '75mg'$  daily for this. - Managed by HemOnc (Dr. Marin Olp).   Past Medical History:  Diagnosis Date   Allergic rhinitis    Allergy    Anxiety    Asthma    uses oxygen 2.5 l/m 24/7, sleeps elevated   Complication of anesthesia    very sensitive to sedatives, B/P drops   CVA (cerebral vascular accident) ([place]) 1999, 2014   residual left side weakness, dysphagia, word finding difficulty, and short term memory; uses mobile chair, cane   Dependence on supplemental oxygen    Depression    Dizziness    Fibromyalgia    Fluttering heart    GERD (gastroesophageal reflux disease)    Hyperlipidemia    Hypertension    Migraine headache    Migraine, unspecified, not intractable, without status migrainosus    Other amnesia    Polycythemia rubra vera (HCC)    Radiculopathy, site unspecified    Thoracic outlet syndrome 1981   repair   TIA (transient ischemic attack)    Unspecified chronic bronchitis ([place])    Unspecified dementia without behavioral disturbance    Vertigo     Past Surgical History:  Procedure Laterality Date   APPENDECTOMY     BALLOON DILATION N/A 10/27/2013   Procedure: BALLOON DILATION;  Surgeon: Arta Silence, MD;  Location: [facility];  Service: Endoscopy;  Laterality: N/A;   BREAST SURGERY  1979   CHOLECYSTECTOMY     CYST REMOVAL HAND Bilateral    left done 10-13-13(Dr. Gramig)   DILATION AND CURETTAGE OF UTERUS     ESOPHAGOGASTRODUODENOSCOPY (EGD) WITH PROPOFOL N/A 10/27/2013   Procedure: ESOPHAGOGASTRODUODENOSCOPY (EGD) WITH PROPOFOL;  Surgeon: Arta Silence, MD;  Location: [facility];  Service:  Endoscopy;  Laterality: N/A;   GALLBLADDER SURGERY     KNEE SURGERY     repair torn ligament/capsule knee cruciat   REDUCTION MAMMAPLASTY     RESECTION RIB PARTIAL     RHINOPLASTY     Root Resection and Revascularization  1980   of Long thoracic artery   scalenectomy     synonectomy     TRANSTHORACIC ECHOCARDIOGRAM  12/2019   Normal LV function.  EF 66 5%.  Grade 1 diastolic dysfunction.  Normal echocardiogram for age    Current Medications: No outpatient medications have been marked as taking for the 09/24/22 encounter (Appointment) with Darreld Mclean, PA-C.     Allergies:   Epinephrine, Flonase [fluticasone], Protriptyline hcl, Tamiflu [oseltamivir phosphate], Tamiflu [oseltamivir], Tizanidine, Gabapentin, Other, Qvar [beclomethasone], Spiriva [tiotropium bromide monohydrate], Tiotropium, Zonegran, Advair diskus [fluticasone-salmeterol], Baclofen, Chlorzoxazone, Lamotrigine, Levofloxacin, Protriptyline, Ropinirole, Tiotropium bromide monohydrate, and Verapamil   Social History   Socioeconomic History   Marital status: Married    Spouse name: Not on file   Number of children: N   Years of education: Not on file   Highest education level: Master's degree (e.g., MA, MS, MEng, MEd, MSW, MBA)  Occupational History   Occupation: RETIRED    Employer: RETIRED    Comment: occupational hand therapist  Tobacco Use   Smoking status: Never   Smokeless tobacco: Never   Tobacco comments:    never used tobacco; secondary smoke exposure  Vaping Use   Vaping Use: Never used  Substance and Sexual Activity   Alcohol use: Not Currently    Alcohol/week: 0.0 standard drinks of alcohol    Comment: seldom, wine @ christmas   Drug use: No   Sexual activity: Not on file  Other Topics Concern   Not on file  Social History Narrative   Lives at home with husband  Engineer, technical sales) and service dog   Right handed   Caffeine: tries to avoid    Social Determinants of Radio broadcast assistant  Strain: Not on file  Food Insecurity: Not on file  Transportation Needs: Not on file  Physical Activity: Not on file  Stress: Not on file  Social Connections: Not on file     Family History: The patient's family history includes Allergies in an other family member; Cancer in her mother; Cervical cancer in her mother; Clotting disorder in her mother; Emphysema in her father and another family member; Heart attack in an other family member; Heart disease in her father and mother; Hypertension in her mother and another family member; Irregular heart beat in her mother; Lung cancer in her father; Other in her father and other family members; Rheum arthritis in an other family member; Skin cancer in her father; Stroke in some other family members.  ROS:   Please see the history of present illness.     EKGs/Labs/Other Studies Reviewed:    The following studies were reviewed:  Myoview 01/16/2010: Impressions: 1.  No reversible ischemia or infarction.  2.  Normal wall motion.  3.  Left ventricular ejection fraction equal 69 %  _______________  Echocardiogram 12/08/2019: Impressions:  1. Left ventricular ejection fraction, by visual estimation, is 60 to  65%. The left ventricle has normal function. There is borderline left  ventricular hypertrophy.   2. Left ventricular ejection fraction by 3D volume is is 62 %.   3. Left ventricular diastolic parameters are indeterminate.   4. The left ventricle has no regional wall motion abnormalities.   5. Global right ventricle has normal systolic function.The right  ventricular size is normal. No increase in right ventricular wall  thickness.   6. Left atrial size was mild-moderately dilated.   7. The mitral valve is grossly normal. Mild mitral valve regurgitation.   8. The tricuspid valve is grossly normal.   9. The aortic valve is tricuspid. Aortic valve regurgitation is not  visualized. No evidence of aortic valve sclerosis or stenosis.  10.  Normal pulmonary artery systolic pressure.  11. The tricuspid regurgitant velocity is 2.07 m/s, and with an assumed  right atrial pressure of 3 mmHg, the estimated right ventricular systolic  pressure is normal at 20.1 mmHg.  12. The inferior vena cava is normal in size with greater than 50%  respiratory variability, suggesting right atrial pressure of 3 mmHg.  13. No prior Echocardiogram.   EKG:  EKG ordered today. EKG personally reviewed and demonstrates ***.  Recent Labs: 09/10/2022: ALT 16; BUN 17; Creatinine 0.82; Hemoglobin 11.0; Platelet Count 247; Potassium 4.2; Sodium 141  Recent Lipid Panel    Component Value Date/Time   CHOL 152 02/15/2021 1016   TRIG 181 (H) 02/15/2021 1016   HDL 58 02/15/2021 1016   CHOLHDL 2.6 02/15/2021 1016   VLDL 36 02/15/2021 1016   LDLCALC 58 02/15/2021 1016    Physical Exam:    Vital Signs: There were no vitals taken for this visit.    Wt Readings from Last 3 Encounters:  09/10/22 194 lb 0.6 oz (88 kg)  07/22/22 193 lb 0.6 oz (87.6 kg)  06/07/22 193 lb (87.5 kg)     General: 71 y.o. female in no acute distress. HEENT: Normocephalic and atraumatic. Sclera clear. EOMs intact. Neck: Supple. No carotid bruits. No JVD. Heart: *** RRR. Distinct S1 and S2. No murmurs, gallops, or rubs. Radial and distal pedal pulses 2+ and equal bilaterally. Lungs: No increased work of breathing. Clear to ausculation bilaterally. No wheezes, rhonchi, or rales.  Abdomen: Soft, non-distended, and non-tender to palpation. Bowel sounds present in all 4 quadrants.  MSK: Normal strength and tone for age. *** Extremities: No lower extremity edema.    Skin: Warm and dry. Neuro: Alert and oriented x3. No focal deficits. Psych: Normal affect. Responds appropriately.   Assessment:    No diagnosis found.  Plan:     Disposition: Follow up in ***   Medication Adjustments/Labs and Tests Ordered: Current medicines are reviewed at length with the patient today.   Concerns regarding medicines are outlined above.  No orders of the defined types were placed in this encounter.  No orders of the defined types were placed in this encounter.   There are no Patient Instructions on file for this visit.   Signed, Darreld Mclean, PA-C  09/23/2022 10:57 AM    [place]

## 2022-09-23 NOTE — Progress Notes (Addendum)
Cardiology Office Note:    Date:  09/24/2022   ID:  AVYNN KLASSEN, DOB [birth year], MRN [id number]  PCP:  Natalie Rasmussen, MD   [facility] Cardiologist:  Natalie Hew, MD     Referring MD: Natalie Rasmussen, MD   CC: Here for one year follow-up  History of Present Illness:    Natalie Hendrix is a 71 y.o. female with a hx of the following:  Polycythemia vera (phlebotomy dependent) COPD, chronic oxygen dependent Palpitations Hyperlipidemia  Previous cardiovascular history includes Myoview in 2011 that was normal.  Most recently echocardiogram in 2021 revealed EF, 60 to 65%.  Borderline left ventricular hypertrophy (grade 1 diastolic dysfunction), mild dilation of left atrium but nothing significant.  No significant valve abnormalities.  Last seen by Natalie Hendrix on December 30, 2019.  History of chronic dyspnea on exertion associated with COPD, denied any orthopnea, edema, or PND.  Did well on chronic oxygen.  Noted some skipped beats and had purchased cardia mobile device, palpitations were not long-lasting or frequent.  BP well controlled. Was told to follow-up in 1 year.    Today she presents for 1 year follow-up and states she is doing well. Patient is a poor historian and states she has recall problems. Wears 2 liters of oxygen continuously but states she does not need to wear any for this visit. No significant changes to her health since she was last seen by Natalie Hendrix. Denies any chest pain, worsening shortness of breath, palpitations, syncope, presyncope, dizziness, orthopnea, PND, bleeding, or claudication. Regualrly gets blood work drawn. Does admit to atypical pain along right rib cage associated from past fall, otherwise doing well. Denies any other questions or concerns today.   Past Medical History:  Diagnosis Date   Allergic rhinitis    Allergy    Anxiety    Asthma    uses oxygen 2.5 l/m 24/7, sleeps elevated   Complication of anesthesia    very  sensitive to sedatives, B/P drops   CVA (cerebral vascular accident) (Natalie Hendrix) 1999, 2014   residual left side weakness, dysphagia, word finding difficulty, and short term memory; uses mobile chair, cane   Dependence on supplemental oxygen    Depression    Dizziness    Fibromyalgia    Fluttering heart    GERD (gastroesophageal reflux disease)    Hyperlipidemia    Hypertension    Migraine headache    Migraine, unspecified, not intractable, without status migrainosus    Other amnesia    Polycythemia rubra vera (HCC)    Radiculopathy, site unspecified    Thoracic outlet syndrome 1981   repair   TIA (transient ischemic attack)    Unspecified chronic bronchitis ([place])    Unspecified dementia without behavioral disturbance    Vertigo     Past Surgical History:  Procedure Laterality Date   APPENDECTOMY     BALLOON DILATION N/A 10/27/2013   Procedure: BALLOON DILATION;  Surgeon: Natalie Silence, MD;  Location: [facility];  Service: Endoscopy;  Laterality: N/A;   BREAST SURGERY  1979   CHOLECYSTECTOMY     CYST REMOVAL HAND Bilateral    left done 10-13-13(Dr. Gramig)   DILATION AND CURETTAGE OF UTERUS     ESOPHAGOGASTRODUODENOSCOPY (EGD) WITH PROPOFOL N/A 10/27/2013   Procedure: ESOPHAGOGASTRODUODENOSCOPY (EGD) WITH PROPOFOL;  Surgeon: Natalie Silence, MD;  Location: [facility];  Service: Endoscopy;  Laterality: N/A;   GALLBLADDER SURGERY     KNEE SURGERY     repair torn  ligament/capsule knee cruciat   REDUCTION MAMMAPLASTY     RESECTION RIB PARTIAL     RHINOPLASTY     Root Resection and Revascularization  1980   of Long thoracic artery   scalenectomy     synonectomy     TRANSTHORACIC ECHOCARDIOGRAM  12/2019   Normal LV function.  EF 66 5%.  Grade 1 diastolic dysfunction.  Normal echocardiogram for age    Current Medications: Current Meds  Medication Sig   albuterol (PROAIR HFA) 108 (90 Base) MCG/ACT inhaler Inhale 2 puffs into the lungs every 6 (six) hours as needed for  wheezing or shortness of breath.   azelastine (ASTELIN) 0.1 % nasal spray Place 1 spray into both nostrils 2 (two) times daily.   beclomethasone (BECONASE-AQ) 42 MCG/SPRAY nasal spray 2 puffs each nostril once daily   benzonatate (TESSALON) 100 MG capsule Take 1 capsule (100 mg total) by mouth every 6 (six) hours as needed for cough.   buPROPion (WELLBUTRIN XL) 300 MG 24 hr tablet Take 1 tablet (300 mg total) by mouth daily with breakfast.   Calcium Carbonate-Vit D-Min (CALCIUM 1200 PO) Take 1,250 mg by mouth daily.    clopidogrel (PLAVIX) 75 MG tablet Take 1 tablet (75 mg total) by mouth daily.   cyclobenzaprine (FLEXERIL) 10 MG tablet Take 10 mg by mouth every 8 (eight) hours as needed.   ESTRACE VAGINAL 0.1 MG/GM vaginal cream Place 1 Applicatorful vaginally daily. Small amount each dose per pt   ezetimibe (ZETIA) 10 MG tablet TAKE 1 TABLET BY MOUTH DAILY.   fexofenadine (ALLEGRA ALLERGY) 180 MG tablet Take 1 tablet (180 mg total) by mouth daily.   hydrOXYzine (ATARAX/VISTARIL) 10 MG tablet Take 10 mg by mouth 3 (three) times daily as needed.   hyoscyamine (ANASPAZ) 0.125 MG TBDP disintergrating tablet Place 1 tablet (0.125 mg total) under the tongue 2 (two) times daily as needed. (Patient taking differently: Place 0.125 mg under the tongue 2 (two) times daily as needed for bladder spasms or cramping.)   lidocaine (LIDODERM) 5 % Place 1 patch onto the skin as needed (pain). Remove & Discard patch within 12 hours or as directed by MD   LORazepam (ATIVAN) 1 MG tablet Take 1 tablet (1 mg total) by mouth 2 (two) times daily. May take extra 3rd dose if needed (Patient taking differently: Take 1 mg by mouth daily as needed. May take extra 3rd dose if needed)   magnesium gluconate (MAGONATE) 500 MG tablet Take 500 mg by mouth daily as needed (constipation).   meclizine (ANTIVERT) 25 MG tablet Take 1 tablet (25 mg total) by mouth 3 (three) times daily as needed for dizziness. 3 month supply (Patient  taking differently: Take 25 mg by mouth 3 (three) times daily. 3 month supply)   metoprolol succinate (TOPROL-XL) 50 MG 24 hr tablet Take 1 tablet (50 mg total) by mouth daily with breakfast. (Patient taking differently: Take 75 mg by mouth daily with breakfast.)   morphine (MS CONTIN) 30 MG 12 hr tablet Take 1 tablet by mouth every 12 (twelve) hours.   morphine (MSIR) 15 MG tablet Take 15 mg by mouth every 4 (four) hours as needed for severe pain.   Multiple Vitamins-Minerals (CENTRUM SILVER) tablet Take 1 tablet by mouth daily.   Omega-3 Fatty Acids (FISH OIL) 1200 MG CAPS Take 1,200 mg by mouth at bedtime.    OXYGEN Inhale 2.5 L/hr into the lungs continuous.   pantoprazole (PROTONIX) 40 MG tablet TAKE 1 TABLET (40 MG  TOTAL) BY MOUTH DAILY.   Polyethyl Glycol-Propyl Glycol 0.4-0.3 % SOLN Apply 1 drop to eye daily as needed (dry eyes).    pravastatin (PRAVACHOL) 40 MG tablet Take 1 tablet (40 mg total) by mouth at bedtime.   promethazine (PHENERGAN) 25 MG tablet Take 25 mg by mouth every 8 (eight) hours as needed for nausea or vomiting.    Respiratory Therapy Supplies (NEBULIZER) DEVI Use as directed   ZOVIRAX 5 % Apply 1 application topically as needed (flair).      Allergies:   Epinephrine, Flonase [fluticasone], Protriptyline hcl, Tamiflu [oseltamivir phosphate], Tamiflu [oseltamivir], Tizanidine, Gabapentin, Other, Qvar [beclomethasone], Spiriva [tiotropium bromide monohydrate], Tiotropium, Zonegran, Advair diskus [fluticasone-salmeterol], Baclofen, Chlorzoxazone, Lamotrigine, Levofloxacin, Protriptyline, Ropinirole, Tiotropium bromide monohydrate, and Verapamil   Social History   Socioeconomic History   Marital status: Married    Spouse name: Not on file   Number of children: N   Years of education: Not on file   Highest education level: Master's degree (e.g., MA, MS, MEng, MEd, MSW, MBA)  Occupational History   Occupation: RETIRED    Employer: RETIRED    Comment: occupational hand  therapist  Tobacco Use   Smoking status: Never   Smokeless tobacco: Never   Tobacco comments:    never used tobacco; secondary smoke exposure  Vaping Use   Vaping Use: Never used  Substance and Sexual Activity   Alcohol use: Not Currently    Alcohol/week: 0.0 standard drinks of alcohol    Comment: seldom, wine @ christmas   Drug use: No   Sexual activity: Not on file  Other Topics Concern   Not on file  Social History Narrative   Lives at home with husband  Engineer, technical sales) and service dog   Right handed   Caffeine: tries to avoid    Social Determinants of Radio broadcast assistant Strain: Not on file  Food Insecurity: Not on file  Transportation Needs: Not on file  Physical Activity: Not on file  Stress: Not on file  Social Connections: Not on file     Family History: The patient's family history includes Allergies in an other family member; Cancer in her mother; Cervical cancer in her mother; Clotting disorder in her mother; Emphysema in her father and another family member; Heart attack in an other family member; Heart disease in her father and mother; Hypertension in her mother and another family member; Irregular heart beat in her mother; Lung cancer in her father; Other in her father and other family members; Rheum arthritis in an other family member; Skin cancer in her father; Stroke in some other family members.  ROS:   Review of Systems  Constitutional: Negative.   HENT: Negative.    Eyes: Negative.   Respiratory:  Positive for shortness of breath. Negative for cough, hemoptysis, sputum production and wheezing.        Chronic DOE. See HPI.   Cardiovascular: Negative.   Gastrointestinal: Negative.   Genitourinary: Negative.   Musculoskeletal:  Positive for back pain. Negative for falls, joint pain, myalgias and neck pain.  Skin: Negative.   Neurological: Negative.   Endo/Heme/Allergies: Negative.   Psychiatric/Behavioral:  Positive for memory loss. Negative for  depression, hallucinations, substance abuse and suicidal ideas. The patient is not nervous/anxious and does not have insomnia.     Please see the history of present illness.    All other systems reviewed and are negative.  EKGs/Labs/Other Studies Reviewed:    The following studies were reviewed today:  EKG:  EKG is ordered today.  The ekg ordered today demonstrates NSR, 75 bpm, otherwise nothing acute.   2D complete echocardiogram on December 08, 2019:  1. Left ventricular ejection fraction, by visual estimation, is 60 to  65%. The left ventricle has normal function. There is borderline left  ventricular hypertrophy.   2. Left ventricular ejection fraction by 3D volume is is 62 %.   3. Left ventricular diastolic parameters are indeterminate.   4. The left ventricle has no regional wall motion abnormalities.   5. Global right ventricle has normal systolic function.The right  ventricular size is normal. No increase in right ventricular wall  thickness.   6. Left atrial size was mild-moderately dilated.   7. The mitral valve is grossly normal. Mild mitral valve regurgitation.   8. The tricuspid valve is grossly normal.   9. The aortic valve is tricuspid. Aortic valve regurgitation is not  visualized. No evidence of aortic valve sclerosis or stenosis.  10. Normal pulmonary artery systolic pressure.  11. The tricuspid regurgitant velocity is 2.07 m/s, and with an assumed  right atrial pressure of 3 mmHg, the estimated right ventricular systolic  pressure is normal at 20.1 mmHg.  12. The inferior vena cava is normal in size with greater than 50%  respiratory variability, suggesting right atrial pressure of 3 mmHg.  13. No prior Echocardiogram.  Myoview on January 16, 2010: 1.  No reversible ischemia or infarction.  2.  Normal wall motion.  3.  Left ventricular ejection fraction equal 69 %  Recent Labs: 09/10/2022: ALT 16; BUN 17; Creatinine 0.82; Hemoglobin 11.0; Platelet Count  247; Potassium 4.2; Sodium 141  Recent Lipid Panel    Component Value Date/Time   CHOL 152 02/15/2021 1016   TRIG 181 (H) 02/15/2021 1016   HDL 58 02/15/2021 1016   CHOLHDL 2.6 02/15/2021 1016   VLDL 36 02/15/2021 1016   LDLCALC 58 02/15/2021 1016     Risk Assessment/Calculations:        The 10-year ASCVD risk score (Arnett DK, et al., 2019) is: 11.5%   Values used to calculate the score:     Age: 72 years     Sex: Female     Is Non-Hispanic African American: No     Diabetic: No     Tobacco smoker: No     Systolic Blood Pressure: 101 mmHg     Is BP treated: Yes     HDL Cholesterol: 58 mg/dL     Total Cholesterol: 152 mg/dL        Physical Exam:    VS:  BP 120/60   Pulse 75   Ht '5\' 8"'$  (1.727 m)   Wt 193 lb 6.4 oz (87.7 kg)   BMI 29.41 kg/m     Wt Readings from Last 3 Encounters:  09/24/22 193 lb 6.4 oz (87.7 kg)  09/10/22 194 lb 0.6 oz (88 kg)  07/22/22 193 lb 0.6 oz (87.6 kg)     GEN: Well nourished, well developed 71 y.o. Caucasian female in no acute distress HEENT: Normal NECK: No JVD; No carotid bruits CARDIAC: S1/S2, RRR, no murmurs, rubs, gallops; 2+ peripheral pulses throughout, strong and equal bialterally RESPIRATORY:  Clear and diminished to auscultation without rales, wheezing or rhonchi  ABDOMEN: Soft, non-tender, non-distended MUSCULOSKELETAL:  No edema; No deformity  SKIN: Warm and dry NEUROLOGIC:  Alert and oriented x 3, occasional forgetfulness and mumbled speech PSYCHIATRIC:  Normal affect   ASSESSMENT:    1. Palpitations  2. Hyperlipidemia, unspecified hyperlipidemia type   3. Chronic obstructive pulmonary disease, unspecified COPD type ([place])   4. Polycythemia vera ([place])    PLAN:    In order of problems listed above:  Palpitations Denies any recent palpitations. EKG shows NSR. Continue Toprol XL. Stated if she feels any palpitations, to let our office know and consider Zio monitor.   HLD Last lipid profile in 2022 revealed LDL  58, Trig 181, and Total Chol 152. She is due for repeat bloodwork. She is deferring blood work to be drawn next week at cancer center. Will defer this blood work to be drawn then. Continue Pravastatin, Zetia, and Omega 3 fatty acids. Heart healthy diet and regular cardiovascular exercise as tolerated encouraged.   COPD Denies any recent exacerbations. On chronic oxygen therapy. Continue current medication regimen and continue to follow with PCP and Dr. Annamaria Boots. If breathing worsens, I notified her to let our office know and may consider repeat Echo if clinically indicated. Last TTE, 12/2019, revealed EF 60-65%.  Polycythemia Vera Following Oncology. Continue with regular blood draws.   5. Disposition: Follow up with Natalie Hendrix in 4-5 months or sooner if anything changes.    Medication Adjustments/Labs and Tests Ordered: Current medicines are reviewed at length with the patient today.  Concerns regarding medicines are outlined above.  No orders of the defined types were placed in this encounter.  No orders of the defined types were placed in this encounter.   Patient Instructions  Medication Instructions:  The current medical regimen is effective;  continue present plan and medications.  *If you need a refill on your cardiac medications before your next appointment, please call your pharmacy*   Follow-Up: At [facility], you and your health needs are our priority.  As part of our continuing mission to provide you with exceptional heart care, we have created designated Provider Care Teams.  These Care Teams include your primary Cardiologist (physician) and Advanced Practice Providers (APPs -  Physician Assistants and Nurse Practitioners) who all work together to provide you with the care you need, when you need it.  We recommend signing up for the patient portal called "MyChart".  Sign up information is provided on this After Visit Summary.  MyChart is used to connect with patients  for Virtual Visits (Telemedicine).  Patients are able to view lab/test results, encounter notes, upcoming appointments, etc.  Non-urgent messages can be sent to your provider as well.   To learn more about what you can do with MyChart, go to [email or web address].    Your next appointment:   4-5 month(s)  The format for your next appointment:   In Person  Provider:   Dr.Harding           Signed, Finis Bud, NP  09/24/2022 4:44 PM    [place]

## 2022-09-24 ENCOUNTER — Encounter: Payer: Self-pay | Admitting: Nurse Practitioner

## 2022-09-24 ENCOUNTER — Ambulatory Visit: Payer: Medicare HMO | Attending: Cardiology | Admitting: Nurse Practitioner

## 2022-09-24 VITALS — BP 120/60 | HR 75 | Ht 68.0 in | Wt 193.4 lb

## 2022-09-24 DIAGNOSIS — J449 Chronic obstructive pulmonary disease, unspecified: Secondary | ICD-10-CM | POA: Diagnosis not present

## 2022-09-24 DIAGNOSIS — R002 Palpitations: Secondary | ICD-10-CM | POA: Diagnosis not present

## 2022-09-24 DIAGNOSIS — E785 Hyperlipidemia, unspecified: Secondary | ICD-10-CM | POA: Diagnosis not present

## 2022-09-24 DIAGNOSIS — D45 Polycythemia vera: Secondary | ICD-10-CM | POA: Diagnosis not present

## 2022-09-24 NOTE — Patient Instructions (Signed)
Medication Instructions:  The current medical regimen is effective;  continue present plan and medications.  *If you need a refill on your cardiac medications before your next appointment, please call your pharmacy*   Follow-Up: At [facility], you and your health needs are our priority.  As part of our continuing mission to provide you with exceptional heart care, we have created designated Provider Care Teams.  These Care Teams include your primary Cardiologist (physician) and Advanced Practice Providers (APPs -  Physician Assistants and Nurse Practitioners) who all work together to provide you with the care you need, when you need it.  We recommend signing up for the patient portal called "MyChart".  Sign up information is provided on this After Visit Summary.  MyChart is used to connect with patients for Virtual Visits (Telemedicine).  Patients are able to view lab/test results, encounter notes, upcoming appointments, etc.  Non-urgent messages can be sent to your provider as well.   To learn more about what you can do with MyChart, go to [email or web address].    Your next appointment:   4-5 month(s)  The format for your next appointment:   In Person  Provider:   Dr.Harding

## 2022-09-25 ENCOUNTER — Ambulatory Visit: Payer: Medicare HMO | Admitting: Cardiology

## 2022-09-27 NOTE — Addendum Note (Signed)
Addended by: Caprice Beaver T on: 09/27/2022 08:18 AM   Modules accepted: Orders

## 2022-10-01 ENCOUNTER — Inpatient Hospital Stay: Payer: Medicare HMO

## 2022-10-01 ENCOUNTER — Inpatient Hospital Stay: Payer: Medicare HMO | Admitting: Family

## 2022-10-09 ENCOUNTER — Inpatient Hospital Stay: Payer: Medicare HMO | Attending: Hematology & Oncology

## 2022-10-09 ENCOUNTER — Inpatient Hospital Stay: Payer: Medicare HMO

## 2022-10-09 ENCOUNTER — Encounter: Payer: Self-pay | Admitting: Family

## 2022-10-09 ENCOUNTER — Inpatient Hospital Stay (HOSPITAL_BASED_OUTPATIENT_CLINIC_OR_DEPARTMENT_OTHER): Payer: Medicare HMO | Admitting: Family

## 2022-10-09 ENCOUNTER — Other Ambulatory Visit: Payer: Self-pay | Admitting: Family

## 2022-10-09 ENCOUNTER — Emergency Department (HOSPITAL_COMMUNITY)
Admission: EM | Admit: 2022-10-09 | Discharge: 2022-10-09 | Disposition: A | Discharge status: Home / Self Care | Payer: Medicare HMO | Attending: Emergency Medicine | Admitting: Emergency Medicine

## 2022-10-09 ENCOUNTER — Encounter (HOSPITAL_COMMUNITY): Payer: Self-pay

## 2022-10-09 ENCOUNTER — Other Ambulatory Visit: Payer: Self-pay

## 2022-10-09 ENCOUNTER — Emergency Department (HOSPITAL_COMMUNITY): Payer: Medicare HMO

## 2022-10-09 VITALS — BP 118/46 | HR 75 | Temp 97.9°F | Resp 20 | Ht 68.0 in | Wt 188.0 lb

## 2022-10-09 DIAGNOSIS — Z7901 Long term (current) use of anticoagulants: Secondary | ICD-10-CM | POA: Diagnosis not present

## 2022-10-09 DIAGNOSIS — S0990XA Unspecified injury of head, initial encounter: Secondary | ICD-10-CM | POA: Diagnosis present

## 2022-10-09 DIAGNOSIS — Z7902 Long term (current) use of antithrombotics/antiplatelets: Secondary | ICD-10-CM | POA: Diagnosis not present

## 2022-10-09 DIAGNOSIS — M50321 Other cervical disc degeneration at C4-C5 level: Secondary | ICD-10-CM | POA: Insufficient documentation

## 2022-10-09 DIAGNOSIS — D45 Polycythemia vera: Secondary | ICD-10-CM

## 2022-10-09 DIAGNOSIS — D751 Secondary polycythemia: Secondary | ICD-10-CM

## 2022-10-09 DIAGNOSIS — R519 Headache, unspecified: Secondary | ICD-10-CM | POA: Insufficient documentation

## 2022-10-09 DIAGNOSIS — W19XXXA Unspecified fall, initial encounter: Secondary | ICD-10-CM | POA: Insufficient documentation

## 2022-10-09 DIAGNOSIS — D500 Iron deficiency anemia secondary to blood loss (chronic): Secondary | ICD-10-CM | POA: Insufficient documentation

## 2022-10-09 LAB — CBC WITH DIFFERENTIAL/PLATELET
Abs Immature Granulocytes: 0.02 10*3/uL (ref 0.00–0.07)
Basophils Absolute: 0 10*3/uL (ref 0.0–0.1)
Basophils Relative: 1 %
Eosinophils Absolute: 0.1 10*3/uL (ref 0.0–0.5)
Eosinophils Relative: 2 %
HCT: 39.9 % (ref 36.0–46.0)
Hemoglobin: 11.7 g/dL — ABNORMAL LOW (ref 12.0–15.0)
Immature Granulocytes: 0 %
Lymphocytes Relative: 27 %
Lymphs Abs: 1.5 10*3/uL (ref 0.7–4.0)
MCH: 21.7 pg — ABNORMAL LOW (ref 26.0–34.0)
MCHC: 29.3 g/dL — ABNORMAL LOW (ref 30.0–36.0)
MCV: 74.2 fL — ABNORMAL LOW (ref 80.0–100.0)
Monocytes Absolute: 0.6 10*3/uL (ref 0.1–1.0)
Monocytes Relative: 11 %
Neutro Abs: 3.2 10*3/uL (ref 1.7–7.7)
Neutrophils Relative %: 59 %
Platelets: 288 10*3/uL (ref 150–400)
RBC: 5.38 MIL/uL — ABNORMAL HIGH (ref 3.87–5.11)
RDW: 18 % — ABNORMAL HIGH (ref 11.5–15.5)
WBC: 5.6 10*3/uL (ref 4.0–10.5)
nRBC: 0 % (ref 0.0–0.2)

## 2022-10-09 LAB — IRON AND IRON BINDING CAPACITY (CC-WL,HP ONLY)
Iron: 34 ug/dL (ref 28–170)
Saturation Ratios: 6 % — ABNORMAL LOW (ref 10.4–31.8)
TIBC: 545 ug/dL — ABNORMAL HIGH (ref 250–450)
UIBC: 511 ug/dL — ABNORMAL HIGH (ref 148–442)

## 2022-10-09 LAB — FERRITIN: Ferritin: 4 ng/mL — ABNORMAL LOW (ref 11–307)

## 2022-10-09 NOTE — Progress Notes (Addendum)
Hematology and Oncology Follow Up Visit  TISHINA LOWN [id number] 02-17-1951 71 y.o. 10/09/2022   Principle Diagnosis:   Secondary polycythemia - JAK2 negative   Current Therapy:        Phlebotomy to maintain hematocrit less than 38% Plavix 75 mg by mouth daily   Interim History:  Ms. Hagner is here today for follow-up. She is having a hard time. She fell Sunday afternoon through a metal gate and states that she hit the front, side and back of her head. She can not remember if she lost consciousness or not.  She has bruising to her left shoulder and arm as well as tenderness of the left breast.  She feels weak and fatigued. She noted nausea and dizziness up until this morning.  She still has blurred, "bumpy" vision of the left eye which occurred after hitting her head.  She is very tearful discussing her fall and her husbands recent falls as well. She is stressed about needing help at home and possibly moving to assisted living.  No obvious blood loss. No petechiae.  No fever, chills, vomiting, cough, rash, chest pain, abdominal pain or changes in bowel or bladder habits.  No swelling in her extremities.  Appetite and hydration are fair. Her weight is down 5 lbs at 188 lbs.   ECOG Performance Status: 1 - Symptomatic but completely ambulatory  Medications:  Allergies as of 10/09/2022       Reactions   Epinephrine Palpitations   Raises heart rate   Flonase [fluticasone] Palpitations   Protriptyline Hcl Other (See Comments)   Severe constipation   Tamiflu [oseltamivir Phosphate] Other (See Comments)   "like I was having a stroke"   Tamiflu [oseltamivir] Anaphylaxis   Tizanidine Other (See Comments)   panic   Gabapentin Other (See Comments)   arthralgia Other reaction(s): Arthralgia (Joint Pain)   Other Hives, Rash   boiron acteane   Qvar [beclomethasone] Other (See Comments)   Spiriva [tiotropium Bromide Monohydrate] Rash   Tiotropium Rash   Zonegran Other (See Comments)    Mood swings   Advair Diskus [fluticasone-salmeterol] Rash   Baclofen Rash   Chlorzoxazone Rash   Lamotrigine Rash   Levofloxacin Rash   Protriptyline Rash   Ropinirole Rash   Tiotropium Bromide Monohydrate Rash   Verapamil Rash        Medication List        Accurate as of October 09, 2022 10:01 AM. If you have any questions, ask your nurse or doctor.          albuterol 108 (90 Base) MCG/ACT inhaler Commonly known as: ProAir HFA Inhale 2 puffs into the lungs every 6 (six) hours as needed for wheezing or shortness of breath.   albuterol (2.5 MG/3ML) 0.083% nebulizer solution Commonly known as: PROVENTIL Take 3 mLs (2.5 mg total) by nebulization every 6 (six) hours as needed for wheezing or shortness of breath.   azelastine 0.1 % nasal spray Commonly known as: ASTELIN Place 1 spray into both nostrils 2 (two) times daily.   beclomethasone 42 MCG/SPRAY nasal spray Commonly known as: BECONASE-AQ 2 puffs each nostril once daily   benzonatate 100 MG capsule Commonly known as: TESSALON Take 1 capsule (100 mg total) by mouth every 6 (six) hours as needed for cough.   buPROPion 300 MG 24 hr tablet Commonly known as: WELLBUTRIN XL Take 1 tablet (300 mg total) by mouth daily with breakfast.   CALCIUM 1200 PO Take 1,250 mg by mouth daily.  Centrum Silver tablet Take 1 tablet by mouth daily.   clopidogrel 75 MG tablet Commonly known as: PLAVIX Take 1 tablet (75 mg total) by mouth daily.   cyclobenzaprine 10 MG tablet Commonly known as: FLEXERIL Take 10 mg by mouth every 8 (eight) hours as needed.   ESTRACE VAGINAL 0.1 MG/GM vaginal cream Generic drug: estradiol Place 1 Applicatorful vaginally daily. Small amount each dose per pt   ezetimibe 10 MG tablet Commonly known as: Zetia TAKE 1 TABLET BY MOUTH DAILY.   fexofenadine 180 MG tablet Commonly known as: Allegra Allergy Take 1 tablet (180 mg total) by mouth daily.   Fish Oil 1200 MG Caps Take 1,200 mg  by mouth at bedtime.   hydrOXYzine 10 MG tablet Commonly known as: ATARAX Take 10 mg by mouth 3 (three) times daily as needed.   hyoscyamine 0.125 MG Tbdp disintergrating tablet Commonly known as: ANASPAZ Place 1 tablet (0.125 mg total) under the tongue 2 (two) times daily as needed. What changed: reasons to take this   lidocaine 5 % Commonly known as: LIDODERM Place 1 patch onto the skin as needed (pain). Remove & Discard patch within 12 hours or as directed by MD   LORazepam 1 MG tablet Commonly known as: ATIVAN Take 1 tablet (1 mg total) by mouth 2 (two) times daily. May take extra 3rd dose if needed What changed:  when to take this reasons to take this   magnesium gluconate 500 MG tablet Commonly known as: MAGONATE Take 500 mg by mouth daily as needed (constipation).   meclizine 25 MG tablet Commonly known as: ANTIVERT Take 1 tablet (25 mg total) by mouth 3 (three) times daily as needed for dizziness. 3 month supply What changed: when to take this   metoprolol succinate 50 MG 24 hr tablet Commonly known as: TOPROL-XL Take 1 tablet (50 mg total) by mouth daily with breakfast. What changed: how much to take   morphine 15 MG tablet Commonly known as: MSIR Take 15 mg by mouth every 4 (four) hours as needed for severe pain.   morphine 30 MG 12 hr tablet Commonly known as: MS CONTIN Take 1 tablet by mouth every 12 (twelve) hours.   Nebulizer Devi Use as directed   OXYGEN Inhale 2.5 L/hr into the lungs continuous.   pantoprazole 40 MG tablet Commonly known as: PROTONIX TAKE 1 TABLET (40 MG TOTAL) BY MOUTH DAILY.   Polyethyl Glycol-Propyl Glycol 0.4-0.3 % Soln Apply 1 drop to eye daily as needed (dry eyes).   pravastatin 40 MG tablet Commonly known as: PRAVACHOL Take 1 tablet (40 mg total) by mouth at bedtime.   promethazine 25 MG tablet Commonly known as: PHENERGAN Take 25 mg by mouth every 8 (eight) hours as needed for nausea or vomiting.   Zovirax 5  % Generic drug: acyclovir ointment Apply 1 application topically as needed (flair).        Allergies:  Allergies  Allergen Reactions   Epinephrine Palpitations    Raises heart rate    Flonase [Fluticasone] Palpitations   Protriptyline Hcl Other (See Comments)    Severe constipation   Tamiflu [Oseltamivir Phosphate] Other (See Comments)    "like I was having a stroke"   Tamiflu [Oseltamivir] Anaphylaxis   Tizanidine Other (See Comments)    panic   Gabapentin Other (See Comments)    arthralgia Other reaction(s): Arthralgia (Joint Pain)   Other Hives and Rash    boiron acteane   Qvar [Beclomethasone] Other (See Comments)  Spiriva [Tiotropium Bromide Monohydrate] Rash   Tiotropium Rash   Zonegran Other (See Comments)    Mood swings   Advair Diskus [Fluticasone-Salmeterol] Rash   Baclofen Rash   Chlorzoxazone Rash   Lamotrigine Rash   Levofloxacin Rash   Protriptyline Rash   Ropinirole Rash   Tiotropium Bromide Monohydrate Rash   Verapamil Rash    Past Medical History, Surgical history, Social history, and Family History were reviewed and updated.  Review of Systems: All other 10 point review of systems is negative.   Physical Exam:  vitals were not taken for this visit.   Wt Readings from Last 3 Encounters:  09/24/22 193 lb 6.4 oz (87.7 kg)  09/10/22 194 lb 0.6 oz (88 kg)  07/22/22 193 lb 0.6 oz (87.6 kg)    Ocular: Sclerae unicteric, pupils equal, round and reactive to light Ear-nose-throat: Oropharynx clear, dentition fair Lymphatic: No cervical or supraclavicular adenopathy Lungs no rales or rhonchi, good excursion bilaterally Heart regular rate and rhythm, no murmur appreciated Abd soft, nontender, positive bowel sounds MSK no focal spinal tenderness, no joint edema Neuro: non-focal, well-oriented, appropriate affect Breasts: Deferred   Lab Results  Component Value Date   WBC 5.6 10/09/2022   HGB 11.7 (L) 10/09/2022   HCT 39.9 10/09/2022   MCV  74.2 (L) 10/09/2022   PLT 288 10/09/2022   Lab Results  Component Value Date   FERRITIN 4 (L) 09/10/2022   IRON 27 (L) 09/10/2022   TIBC 538 (H) 09/10/2022   UIBC 511 (H) 09/10/2022   IRONPCTSAT 5 (L) 09/10/2022   Lab Results  Component Value Date   RETICCTPCT 1.1 04/27/2015   RBC 5.38 (H) 10/09/2022   RETICCTABS 55.1 04/27/2015   No results found for: "KPAFRELGTCHN", "LAMBDASER", "KAPLAMBRATIO" No results found for: "IGGSERUM", "IGA", "IGMSERUM" No results found for: "TOTALPROTELP", "ALBUMINELP", "A1GS", "A2GS", "BETS", "BETA2SER", "GAMS", "MSPIKE", "SPEI"   Chemistry      Component Value Date/Time   NA 141 09/10/2022 1035   NA 147 (H) 10/08/2017 1410   NA 141 02/17/2017 1132   K 4.2 09/10/2022 1035   K 4.6 10/08/2017 1410   K 5.3 (H) 02/17/2017 1132   CL 106 09/10/2022 1035   CL 106 10/08/2017 1410   CO2 28 09/10/2022 1035   CO2 30 10/08/2017 1410   CO2 29 02/17/2017 1132   BUN 17 09/10/2022 1035   BUN 15 10/08/2017 1410   BUN 17.8 02/17/2017 1132   CREATININE 0.82 09/10/2022 1035   CREATININE 1.0 10/08/2017 1410   CREATININE 0.8 02/17/2017 1132      Component Value Date/Time   CALCIUM 9.4 09/10/2022 1035   CALCIUM 9.0 10/08/2017 1410   CALCIUM 9.3 02/17/2017 1132   ALKPHOS 56 09/10/2022 1035   ALKPHOS 59 10/08/2017 1410   ALKPHOS 79 02/17/2017 1132   AST 25 09/10/2022 1035   AST 22 02/17/2017 1132   ALT 16 09/10/2022 1035   ALT 26 10/08/2017 1410   ALT 20 02/17/2017 1132   BILITOT 0.5 09/10/2022 1035   BILITOT 0.39 02/17/2017 1132       Impression and Plan: Ms. Buffalo is a very pleasant 71 yo caucasian female with secondary polycythemia (JAK-2 negative) due to COPD. Discussed ED visit with patient for follow-up post fall and symptomatic of concussion at very least. She is on Plavix which is concerning for possible bleed.  The CT scanner here at [facility] is down.  We will get her over to [facility] ED. Patient's husband Darryl prefers to drive  her over. Patient does  not appear toxic at this time.   Report called to charge RN at [facility] ED.  Phlebotomy held, Hct 39%.  Iron studied pending.  Follow-up in 4 weeks.  Our social worker Jeani Hawking will follow-up with her regarding needing help in the home and possible assisted living options.   Lottie Dawson, NP 11/8/202310:01 AM

## 2022-10-09 NOTE — ED Notes (Signed)
Pt wants to know CT results and wants to be dc. RN aware.

## 2022-10-09 NOTE — ED Provider Notes (Signed)
[facility] DEPT Provider Note   CSN: [id number] Arrival date & time: 10/09/22  1153     History  Chief Complaint  Patient presents with   Natalie Hendrix is a 71 y.o. female.   Fall  Patient presents after fall per report of fell 3 days ago.  Has had unsteadiness.  Hit her head.  On Plavix.  History of vertigo.  History of polycythemia.  Sent in for CT scan.  Was going to go to [facility] but CT scan was broken.  No other injury.     Home Medications Prior to Admission medications   Medication Sig Start Date End Date Taking? Authorizing Provider  albuterol (PROAIR HFA) 108 (90 Base) MCG/ACT inhaler Inhale 2 puffs into the lungs every 6 (six) hours as needed for wheezing or shortness of breath. 08/31/19   Baird Lyons D, MD  albuterol (PROVENTIL) (2.5 MG/3ML) 0.083% nebulizer solution Take 3 mLs (2.5 mg total) by nebulization every 6 (six) hours as needed for wheezing or shortness of breath. 08/31/19 07/22/22  Baird Lyons D, MD  azelastine (ASTELIN) 0.1 % nasal spray Place 1 spray into both nostrils 2 (two) times daily. 09/19/21   [name], MD  beclomethasone (BECONASE-AQ) 42 MCG/SPRAY nasal spray 2 puffs each nostril once daily 09/19/21   Baird Lyons D, MD  benzonatate (TESSALON) 100 MG capsule Take 1 capsule (100 mg total) by mouth every 6 (six) hours as needed for cough. 09/19/21   Baird Lyons D, MD  buPROPion (WELLBUTRIN XL) 300 MG 24 hr tablet Take 1 tablet (300 mg total) by mouth daily with breakfast. 08/16/16   Ria Bush, MD  Calcium Carbonate-Vit D-Min (CALCIUM 1200 PO) Take 1,250 mg by mouth daily.     [provider]  clopidogrel (PLAVIX) 75 MG tablet Take 1 tablet (75 mg total) by mouth daily. 07/17/22   [name], MD  cyclobenzaprine (FLEXERIL) 10 MG tablet Take 10 mg by mouth every 8 (eight) hours as needed. 12/31/19   [provider]  ESTRACE VAGINAL 0.1 MG/GM vaginal cream  Place 1 Applicatorful vaginally daily. Small amount each dose per pt 06/24/12   [provider]  ezetimibe (ZETIA) 10 MG tablet TAKE 1 TABLET BY MOUTH DAILY. 12/09/16   Ria Bush, MD  fexofenadine [facility] ALLERGY) 180 MG tablet Take 1 tablet (180 mg total) by mouth daily. 03/04/19   Baird Lyons D, MD  hydrOXYzine (ATARAX/VISTARIL) 10 MG tablet Take 10 mg by mouth 3 (three) times daily as needed. 06/15/20   [provider]  hyoscyamine (ANASPAZ) 0.125 MG TBDP disintergrating tablet Place 1 tablet (0.125 mg total) under the tongue 2 (two) times daily as needed. Patient taking differently: Place 0.125 mg under the tongue 2 (two) times daily as needed for bladder spasms or cramping. 02/03/17   [name], MD  lidocaine (LIDODERM) 5 % Place 1 patch onto the skin as needed (pain). Remove & Discard patch within 12 hours or as directed by MD    [provider]  LORazepam (ATIVAN) 1 MG tablet Take 1 tablet (1 mg total) by mouth 2 (two) times daily. May take extra 3rd dose if needed Patient taking differently: Take 1 mg by mouth daily as needed. May take extra 3rd dose if needed 02/03/17   Pleas Koch, NP  magnesium gluconate (MAGONATE) 500 MG tablet Take 500 mg by mouth daily as needed (constipation).    [provider]  meclizine (ANTIVERT) 25 MG tablet Take 1 tablet (25 mg total) by mouth 3 (three) times daily as needed for dizziness. 3 month supply Patient taking differently: Take 25 mg by mouth 3 (three) times daily. 3 month supply 11/22/16   [name], MD  metoprolol succinate (TOPROL-XL) 50 MG 24 hr tablet Take 1 tablet (50 mg total) by mouth daily with breakfast. Patient taking differently: Take 75 mg by mouth daily with breakfast. 11/07/16   Ria Bush, MD  morphine (MS CONTIN) 30 MG 12 hr tablet Take 1 tablet by mouth every 12 (twelve) hours. 02/14/20   [provider]  morphine (MSIR) 15 MG tablet Take 15 mg by mouth every 4  (four) hours as needed for severe pain.    [provider]  Multiple Vitamins-Minerals (CENTRUM SILVER) tablet Take 1 tablet by mouth daily.    [provider]  Omega-3 Fatty Acids (FISH OIL) 1200 MG CAPS Take 1,200 mg by mouth at bedtime.     [provider]  OXYGEN Inhale 2.5 L/hr into the lungs continuous.    [provider]  pantoprazole (PROTONIX) 40 MG tablet TAKE 1 TABLET (40 MG TOTAL) BY MOUTH DAILY. 02/03/17   Ria Bush, MD  Polyethyl Glycol-Propyl Glycol 0.4-0.3 % SOLN Apply 1 drop to eye daily as needed (dry eyes).     [provider]  pravastatin (PRAVACHOL) 40 MG tablet Take 1 tablet (40 mg total) by mouth at bedtime. 12/09/16   Ria Bush, MD  promethazine (PHENERGAN) 25 MG tablet Take 25 mg by mouth every 8 (eight) hours as needed for nausea or vomiting.  01/09/15   [provider]  Respiratory Therapy Supplies (NEBULIZER) DEVI Use as directed 07/22/11   [name], MD  ZOVIRAX 5 % Apply 1 application topically as needed (flair).  07/10/12   [provider]      Allergies    Epinephrine, Flonase [fluticasone], Protriptyline hcl, Tamiflu [oseltamivir phosphate], Tamiflu [oseltamivir], Tizanidine, Gabapentin, Other, Qvar [beclomethasone], Spiriva [tiotropium bromide monohydrate], Tiotropium, Zonegran, Advair diskus [fluticasone-salmeterol], Baclofen, Chlorzoxazone, Lamotrigine, Levofloxacin, Protriptyline, Ropinirole, Tiotropium bromide monohydrate, and Verapamil    Review of Systems   Review of Systems  Physical Exam Updated Vital Signs BP 107/76 (BP Location: Left Arm)   Pulse 69   Temp 98.6 F (37 C) (Oral)   Resp 18   SpO2 95%  Physical Exam Vitals and nursing note reviewed.  Constitutional:      Appearance: Normal appearance.  Musculoskeletal:     Cervical back: No tenderness.  Neurological:     General: No focal deficit present.     Mental Status: She is alert.     Comments: Awake and  appropriate.  Somewhat unsteady at baseline.     ED Results / Procedures / Treatments   Labs (all labs ordered are listed, but only abnormal results are displayed) Labs Reviewed - No data to display  EKG None  Radiology CT Head Wo Contrast  Result Date: 10/09/2022 CLINICAL DATA:  Fall. EXAM: CT HEAD WITHOUT CONTRAST CT CERVICAL SPINE WITHOUT CONTRAST TECHNIQUE: Multidetector CT imaging of the head and cervical spine was performed following the standard protocol without intravenous contrast. Multiplanar CT image reconstructions of the cervical spine were also generated. RADIATION DOSE REDUCTION: This exam was performed according to the departmental dose-optimization program which includes automated exposure control, adjustment of the mA and/or kV according to patient size and/or use of iterative reconstruction technique. COMPARISON:  March 25, 2019. FINDINGS: CT HEAD FINDINGS Brain:  No evidence of acute infarction, hemorrhage, hydrocephalus, extra-axial collection or mass lesion/mass effect. Vascular: No hyperdense vessel or unexpected calcification. Skull: Normal. Negative for fracture or focal lesion. Sinuses/Orbits: No acute finding. Other: None. CT CERVICAL SPINE FINDINGS Alignment: Minimal grade 1 anterolisthesis of C3-4 is noted secondary to posterior facet joint hypertrophy. Skull base and vertebrae: No acute fracture. No primary bone lesion or focal pathologic process. Soft tissues and spinal canal: No prevertebral fluid or swelling. No visible canal hematoma. Disc levels: Moderate degenerative disc disease is noted at C4-5, C5-6 and C6-7. Upper chest: Negative. Other: None. IMPRESSION: No acute intracranial abnormality seen. Moderate multilevel degenerative disc disease noted in the cervical spine. No acute abnormality is noted. Electronically Signed   By: Marijo Conception M.D.   On: 10/09/2022 13:02   CT Cervical Spine Wo Contrast  Result Date: 10/09/2022 CLINICAL DATA:  Fall. EXAM: CT  HEAD WITHOUT CONTRAST CT CERVICAL SPINE WITHOUT CONTRAST TECHNIQUE: Multidetector CT imaging of the head and cervical spine was performed following the standard protocol without intravenous contrast. Multiplanar CT image reconstructions of the cervical spine were also generated. RADIATION DOSE REDUCTION: This exam was performed according to the departmental dose-optimization program which includes automated exposure control, adjustment of the mA and/or kV according to patient size and/or use of iterative reconstruction technique. COMPARISON:  March 25, 2019. FINDINGS: CT HEAD FINDINGS Brain: No evidence of acute infarction, hemorrhage, hydrocephalus, extra-axial collection or mass lesion/mass effect. Vascular: No hyperdense vessel or unexpected calcification. Skull: Normal. Negative for fracture or focal lesion. Sinuses/Orbits: No acute finding. Other: None. CT CERVICAL SPINE FINDINGS Alignment: Minimal grade 1 anterolisthesis of C3-4 is noted secondary to posterior facet joint hypertrophy. Skull base and vertebrae: No acute fracture. No primary bone lesion or focal pathologic process. Soft tissues and spinal canal: No prevertebral fluid or swelling. No visible canal hematoma. Disc levels: Moderate degenerative disc disease is noted at C4-5, C5-6 and C6-7. Upper chest: Negative. Other: None. IMPRESSION: No acute intracranial abnormality seen. Moderate multilevel degenerative disc disease noted in the cervical spine. No acute abnormality is noted. Electronically Signed   By: Marijo Conception M.D.   On: 10/09/2022 13:02    Procedures Procedures    Medications Ordered in ED Medications - No data to display  ED Course/ Medical Decision Making/ A&P                           Medical Decision Making  Patient with fall.  On anticoagulation.  Has had some dizziness.  Head CT and cervical spine CT reassuring.  No other apparent trauma.  Will discharge home.        Final Clinical Impression(s) / ED  Diagnoses Final diagnoses:  Fall, initial encounter  Minor head injury, initial encounter    Rx / DC Orders ED Discharge Orders     None         Davonna Belling, MD 10/09/22 1627

## 2022-10-09 NOTE — ED Provider Triage Note (Signed)
Emergency Medicine Provider Triage Evaluation Note  HALIMA FOGAL , a 71 y.o. female  was evaluated in triage.  Pt complains of fall 3 days ago, went to cancer center today, had labs and was sent to ER for head CT. Lost balance, fell into the gate and bruised head. Was a little confused afterwards but recovered quickly.  Hx PCV, is on Plavix.  Review of Systems  Positive: Headache (hx migraines), neck pain Negative: Recurrent vomiting   Physical Exam  BP 120/87 (BP Location: Left Arm)   Pulse 69   Temp 98.4 F (36.9 C) (Oral)   Resp 14   SpO2 98%  Gen:   Awake, no distress   Resp:  Normal effort  MSK:   Moves extremities without difficulty  Other:    Medical Decision Making  Medically screening exam initiated at 12:11 PM.  Appropriate orders placed.  LOYD SALVADOR was informed that the remainder of the evaluation will be completed by another provider, this initial triage assessment does not replace that evaluation, and the importance of remaining in the ED until their evaluation is complete.     Tacy Learn, PA-C 10/09/22 1213

## 2022-10-09 NOTE — ED Triage Notes (Signed)
Pt sent from cancer center for head ct s/p fall 3 days ago hitting head.  Denies LOC, pt is on blood thinners.  Pt reports worsening headache, dizziness, and blurred vision since fall.  Hx of stroke

## 2022-10-13 NOTE — Progress Notes (Deleted)
HPI female never smoker followed for asthma, history pneumonia, cough, complicated by past history for thoracic outlet syndrome, chronic hypoxic respiratory failure, complicated by CVA, GERD, allergic rhinitis, polycythemia, depression O2 2 L sleep and prn/Advanced  Nl PFT 2012 except DLCO 62% Allergy profile was NEG, 4.9 total IgE GI/ Dr Paulita Fujita: Ba swallow> stricture and probably mild aspiration Residual right diaphragm elevation after surgery for right thoracic outlet syndrome --------------------------------------------------------------------------------   08/02/21- 71 year old female never smoker followed for asthma, history pneumonia, cough, chronic hypoxic respiratory failure,  past history for thoracic outlet syndrome, chronic elevation right diaphragm,complicated by CVA, GERD, allergic rhinitis, Polycythemia/ Phlebotomy, depression O2 2-3 L sleep and prn/Adapt   POC = Inogen. -Albuterol hfa, neb albuterol, benzonatate, allegra,  Covid vax- 3 Moderna -----No complaint's Continues O2 for sleep and still needs occ phlebotomy for polycythemia. Denies acute respiratory changes.   10/15/22- 71 year old female never smoker followed for asthma, history pneumonia, cough, chronic hypoxic respiratory failure,  past history for thoracic outlet syndrome, chronic elevation right diaphragm,complicated by CVA, GERD, allergic rhinitis, Polycythemia/ Phlebotomy, Depression O2 2-3 L sleep and prn/Adapt   POC = Inogen. -Albuterol hfa, neb albuterol, benzonatate, allegra, Beconase-AQ,  Covid vax- 3 Moderna Flu vax-   Review of Systems- See HPI   + = positive Constitutional:   No-   weight loss, night sweats, fevers, chills, fatigue, lassitude. HEENT:   frequent  headaches,  Some difficulty swallowing,, sore throat,       No-  Sneezing,+ itching, ear ache, nasal congestion, post nasal drip,  CV:  No-   chest pain, orthopnea, PND, swelling in lower extremities, anasarca, dizziness, palpitations Resp: +  shortness of breath with exertion or at rest. productive cough              No-  coughing up of blood.              No-  change in color of mucus.   wheezing.   Skin: No-   rash or lesions. GI:  No-   heartburn, indigestion, abdominal pain, nausea, vomiting, GU:  MS:  No-   joint pain or swelling.   Neuro- + tremor and poor balance Psych:  No- change in mood or affect. No depression or anxiety.  No memory loss.    Objective:   Physical Exam General- Alert, Oriented, Affect+ tearful, Distress- none acute,  Overweight.  + Rolling walker              O2 POC 2L. Skin- rash-none, lesions- none, excoriation- none Lymphadenopathy- none Head- atraumatic            Eyes- Gross vision intact, PERRLA, conjunctivae clear secretions            Ears- Hearing, canals            Nose- Clear, No- Septal dev, mucus, polyps, erosion, perforation             Throat- Mallampati III , mucosa clear , drainage- none, tonsils- atrophic, Neck- flexible , trachea midline, no stridor , thyroid nl, carotid no bruit Chest - symmetrical excursion , unlabored           Heart/CV- RRR , no murmur , no gallop  , no rub, nl s1 s2                           - JVD- none , edema- none, stasis changes- none, varices- none  Lung- clear to P&A, wheeze-none, cough-none,                                                    dullness-none, rub- none, unlabored           Chest wall- +Vascular surgery scar Right Upper Anterior chest Abd- Br/ Gen/ Rectal- Not done, not indicated Extrem- cyanosis- none, clubbing, none, atrophy- none, strength- . +Rolling walker Neuro- + some word searching

## 2022-10-14 ENCOUNTER — Telehealth: Payer: Self-pay | Admitting: Family

## 2022-10-14 NOTE — Telephone Encounter (Signed)
Contacted pt to schedule appts per 10/09/22 LOS. Unable to reach via phone, voicemail was left.  

## 2022-10-15 ENCOUNTER — Ambulatory Visit: Payer: Medicare HMO | Admitting: Internal Medicine

## 2022-11-12 ENCOUNTER — Inpatient Hospital Stay: Payer: Medicare HMO

## 2022-11-12 ENCOUNTER — Inpatient Hospital Stay: Payer: Medicare HMO | Admitting: Family

## 2022-11-12 ENCOUNTER — Inpatient Hospital Stay: Payer: Medicare HMO | Attending: Hematology & Oncology

## 2022-12-11 ENCOUNTER — Inpatient Hospital Stay (HOSPITAL_BASED_OUTPATIENT_CLINIC_OR_DEPARTMENT_OTHER): Payer: Medicare HMO | Admitting: Family

## 2022-12-11 ENCOUNTER — Other Ambulatory Visit: Payer: Self-pay

## 2022-12-11 ENCOUNTER — Inpatient Hospital Stay: Payer: Medicare HMO

## 2022-12-11 ENCOUNTER — Inpatient Hospital Stay: Payer: Medicare HMO | Attending: Hematology & Oncology

## 2022-12-11 ENCOUNTER — Encounter: Payer: Self-pay | Admitting: Family

## 2022-12-11 VITALS — BP 117/60 | HR 71

## 2022-12-11 VITALS — BP 126/69 | HR 95 | Temp 99.0°F | Resp 19 | Ht 68.0 in | Wt 182.8 lb

## 2022-12-11 DIAGNOSIS — D45 Polycythemia vera: Secondary | ICD-10-CM

## 2022-12-11 DIAGNOSIS — D500 Iron deficiency anemia secondary to blood loss (chronic): Secondary | ICD-10-CM

## 2022-12-11 DIAGNOSIS — D751 Secondary polycythemia: Secondary | ICD-10-CM | POA: Diagnosis not present

## 2022-12-11 LAB — CMP (CANCER CENTER ONLY)
ALT: 16 U/L (ref 0–44)
AST: 21 U/L (ref 15–41)
Albumin: 4.1 g/dL (ref 3.5–5.0)
Alkaline Phosphatase: 54 U/L (ref 38–126)
Anion gap: 6 (ref 5–15)
BUN: 20 mg/dL (ref 8–23)
CO2: 28 mmol/L (ref 22–32)
Calcium: 9.5 mg/dL (ref 8.9–10.3)
Chloride: 104 mmol/L (ref 98–111)
Creatinine: 0.79 mg/dL (ref 0.44–1.00)
GFR, Estimated: >60 mL/min (ref >60)
Glucose, Bld: 91 mg/dL (ref 70–99)
Potassium: 4.4 mmol/L (ref 3.5–5.1)
Sodium: 138 mmol/L (ref 135–145)
Total Bilirubin: 0.6 mg/dL (ref 0.3–1.2)
Total Protein: 6.6 g/dL (ref 6.5–8.1)

## 2022-12-11 LAB — CBC WITH DIFFERENTIAL (CANCER CENTER ONLY)
Abs Immature Granulocytes: 0.01 10*3/uL (ref 0.00–0.07)
Basophils Absolute: 0 10*3/uL (ref 0.0–0.1)
Basophils Relative: 1 %
Eosinophils Absolute: 0.1 10*3/uL (ref 0.0–0.5)
Eosinophils Relative: 3 %
HCT: 37.7 % (ref 36.0–46.0)
Hemoglobin: 11.4 g/dL — ABNORMAL LOW (ref 12.0–15.0)
Immature Granulocytes: 0 %
Lymphocytes Relative: 20 %
Lymphs Abs: 0.9 10*3/uL (ref 0.7–4.0)
MCH: 22.8 pg — ABNORMAL LOW (ref 26.0–34.0)
MCHC: 30.2 g/dL (ref 30.0–36.0)
MCV: 75.2 fL — ABNORMAL LOW (ref 80.0–100.0)
Monocytes Absolute: 0.5 10*3/uL (ref 0.1–1.0)
Monocytes Relative: 11 %
Neutro Abs: 2.7 10*3/uL (ref 1.7–7.7)
Neutrophils Relative %: 65 %
Platelet Count: 212 10*3/uL (ref 150–400)
RBC: 5.01 MIL/uL (ref 3.87–5.11)
RDW: 18.2 % — ABNORMAL HIGH (ref 11.5–15.5)
WBC Count: 4.2 10*3/uL (ref 4.0–10.5)
nRBC: 0 % (ref 0.0–0.2)

## 2022-12-11 LAB — FERRITIN: Ferritin: 4 ng/mL — ABNORMAL LOW (ref 11–307)

## 2022-12-11 MED ORDER — SODIUM CHLORIDE 0.9 % IV SOLN: INTRAVENOUS | Status: DC

## 2022-12-11 NOTE — Patient Instructions (Signed)

## 2022-12-11 NOTE — Progress Notes (Signed)
Hematology and Oncology Follow Up Visit  Natalie Hendrix [id number] 11/28/1951 72 y.o. 12/11/2022   Principle Diagnosis:  Secondary polycythemia - JAK2 negative   Current Therapy:        Phlebotomy to maintain hematocrit less than 38% Plavix 75 mg by mouth daily   Interim History:  Natalie Hendrix is here today for follow-up. She is symptomatic with fatigue, itching and headaches.  Hct is 37.7% and she would like to do a partial phlebotomy today with replacement fluids.  SOB with over exertion unchanged from baseline.  No fever, chills, n/v, cough, chest pain, abdominal pain or changes in bowel or bladder habits.  No obvious blood loss noted. No bruising or petechiae.  No swelling, numbness or tingling in her extremities at this time. No new falls, no syncope.  She has been busy taking care of her husband who recently had the flu. Thankfully, she states that she did not get it.  Appetite and hydration have been good. Weight is down 7 lbs to 181 lbs which she is quite happy about.    ECOG Performance Status: 2 - Symptomatic, <50% confined to bed  Medications:  Allergies as of 12/11/2022       Reactions   Epinephrine Palpitations   Raises heart rate   Flonase [fluticasone] Palpitations   Protriptyline Hcl Other (See Comments)   Severe constipation   Tamiflu [oseltamivir Phosphate] Other (See Comments)   "like I was having a stroke"   Tamiflu [oseltamivir] Anaphylaxis   Tizanidine Other (See Comments)   panic   Gabapentin Other (See Comments)   arthralgia Other reaction(s): Arthralgia (Joint Pain)   Other Hives, Rash   boiron acteane   Qvar [beclomethasone] Other (See Comments)   Spiriva [tiotropium Bromide Monohydrate] Rash   Tiotropium Rash   Zonegran Other (See Comments)   Mood swings   Advair Diskus [fluticasone-salmeterol] Rash   Baclofen Rash   Chlorzoxazone Rash   Lamotrigine Rash   Levofloxacin Rash   Protriptyline Rash   Ropinirole Rash   Tiotropium Bromide  Monohydrate Rash   Verapamil Rash        Medication List        Accurate as of December 11, 2022  1:41 PM. If you have any questions, ask your nurse or doctor.          albuterol 108 (90 Base) MCG/ACT inhaler Commonly known as: ProAir HFA Inhale 2 puffs into the lungs every 6 (six) hours as needed for wheezing or shortness of breath.   albuterol (2.5 MG/3ML) 0.083% nebulizer solution Commonly known as: PROVENTIL Take 3 mLs (2.5 mg total) by nebulization every 6 (six) hours as needed for wheezing or shortness of breath.   azelastine 0.1 % nasal spray Commonly known as: ASTELIN Place 1 spray into both nostrils 2 (two) times daily.   beclomethasone 42 MCG/SPRAY nasal spray Commonly known as: BECONASE-AQ 2 puffs each nostril once daily   benzonatate 100 MG capsule Commonly known as: TESSALON Take 1 capsule (100 mg total) by mouth every 6 (six) hours as needed for cough.   buPROPion 300 MG 24 hr tablet Commonly known as: WELLBUTRIN XL Take 1 tablet (300 mg total) by mouth daily with breakfast.   CALCIUM 1200 PO Take 1,250 mg by mouth daily.   Centrum Silver tablet Take 1 tablet by mouth daily.   clopidogrel 75 MG tablet Commonly known as: PLAVIX Take 1 tablet (75 mg total) by mouth daily.   cyclobenzaprine 10 MG tablet Commonly known as:  FLEXERIL Take 10 mg by mouth every 8 (eight) hours as needed.   ESTRACE VAGINAL 0.1 MG/GM vaginal cream Generic drug: estradiol Place 1 Applicatorful vaginally daily. Small amount each dose per pt   ezetimibe 10 MG tablet Commonly known as: Zetia TAKE 1 TABLET BY MOUTH DAILY.   fexofenadine 180 MG tablet Commonly known as: Allegra Allergy Take 1 tablet (180 mg total) by mouth daily.   Fish Oil 1200 MG Caps Take 1,200 mg by mouth at bedtime.   hydrOXYzine 10 MG tablet Commonly known as: ATARAX Take 10 mg by mouth 3 (three) times daily as needed.   hyoscyamine 0.125 MG Tbdp disintergrating tablet Commonly known as:  ANASPAZ Place 1 tablet (0.125 mg total) under the tongue 2 (two) times daily as needed. What changed: reasons to take this   lidocaine 5 % Commonly known as: LIDODERM Place 1 patch onto the skin as needed (pain). Remove & Discard patch within 12 hours or as directed by MD   LORazepam 1 MG tablet Commonly known as: ATIVAN Take 1 tablet (1 mg total) by mouth 2 (two) times daily. May take extra 3rd dose if needed What changed:  when to take this reasons to take this   magnesium gluconate 500 MG tablet Commonly known as: MAGONATE Take 500 mg by mouth daily as needed (constipation).   meclizine 25 MG tablet Commonly known as: ANTIVERT Take 1 tablet (25 mg total) by mouth 3 (three) times daily as needed for dizziness. 3 month supply What changed: when to take this   metoprolol succinate 50 MG 24 hr tablet Commonly known as: TOPROL-XL Take 1 tablet (50 mg total) by mouth daily with breakfast. What changed: how much to take   morphine 15 MG tablet Commonly known as: MSIR Take 15 mg by mouth every 4 (four) hours as needed for severe pain.   morphine 30 MG 12 hr tablet Commonly known as: MS CONTIN Take 1 tablet by mouth every 12 (twelve) hours.   Nebulizer Devi Use as directed   OXYGEN Inhale 2.5 L/hr into the lungs continuous.   pantoprazole 40 MG tablet Commonly known as: PROTONIX TAKE 1 TABLET (40 MG TOTAL) BY MOUTH DAILY.   Polyethyl Glycol-Propyl Glycol 0.4-0.3 % Soln Apply 1 drop to eye daily as needed (dry eyes).   pravastatin 40 MG tablet Commonly known as: PRAVACHOL Take 1 tablet (40 mg total) by mouth at bedtime.   promethazine 25 MG tablet Commonly known as: PHENERGAN Take 25 mg by mouth every 8 (eight) hours as needed for nausea or vomiting.   Zovirax 5 % Generic drug: acyclovir ointment Apply 1 application topically as needed (flair).        Allergies:  Allergies  Allergen Reactions   Epinephrine Palpitations    Raises heart rate    Flonase  [Fluticasone] Palpitations   Protriptyline Hcl Other (See Comments)    Severe constipation   Tamiflu [Oseltamivir Phosphate] Other (See Comments)    "like I was having a stroke"   Tamiflu [Oseltamivir] Anaphylaxis   Tizanidine Other (See Comments)    panic   Gabapentin Other (See Comments)    arthralgia Other reaction(s): Arthralgia (Joint Pain)   Other Hives and Rash    boiron acteane   Qvar [Beclomethasone] Other (See Comments)   Spiriva [Tiotropium Bromide Monohydrate] Rash   Tiotropium Rash   Zonegran Other (See Comments)    Mood swings   Advair Diskus [Fluticasone-Salmeterol] Rash   Baclofen Rash   Chlorzoxazone Rash  Lamotrigine Rash   Levofloxacin Rash   Protriptyline Rash   Ropinirole Rash   Tiotropium Bromide Monohydrate Rash   Verapamil Rash    Past Medical History, Surgical history, Social history, and Family History were reviewed and updated.  Review of Systems: All other 10 point review of systems is negative.   Physical Exam:  height is '5\' 8"'$  (1.727 m) and weight is 182 lb 12.8 oz (82.9 kg). Her oral temperature is 99 F (37.2 C). Her blood pressure is 126/69 and her pulse is 95. Her respiration is 19 and oxygen saturation is 94%.   Wt Readings from Last 3 Encounters:  12/11/22 182 lb 12.8 oz (82.9 kg)  10/09/22 188 lb 0.6 oz (85.3 kg)  09/24/22 193 lb 6.4 oz (87.7 kg)    Ocular: Sclerae unicteric, pupils equal, round and reactive to light Ear-nose-throat: Oropharynx clear, dentition fair Lymphatic: No cervical or supraclavicular adenopathy Lungs no rales or rhonchi, good excursion bilaterally Heart regular rate and rhythm, no murmur appreciated Abd soft, nontender, positive bowel sounds MSK no focal spinal tenderness, no joint edema Neuro: non-focal, well-oriented, appropriate affect Breasts: Deferred   Lab Results  Component Value Date   WBC 4.2 12/11/2022   HGB 11.4 (L) 12/11/2022   HCT 37.7 12/11/2022   MCV 75.2 (L) 12/11/2022   PLT 212  12/11/2022   Lab Results  Component Value Date   FERRITIN 4 (L) 10/09/2022   IRON 34 10/09/2022   TIBC 545 (H) 10/09/2022   UIBC 511 (H) 10/09/2022   IRONPCTSAT 6 (L) 10/09/2022   Lab Results  Component Value Date   RETICCTPCT 1.1 04/27/2015   RBC 5.01 12/11/2022   RETICCTABS 55.1 04/27/2015   No results found for: "KPAFRELGTCHN", "LAMBDASER", "KAPLAMBRATIO" No results found for: "IGGSERUM", "IGA", "IGMSERUM" No results found for: "TOTALPROTELP", "ALBUMINELP", "A1GS", "A2GS", "BETS", "BETA2SER", "GAMS", "MSPIKE", "SPEI"   Chemistry      Component Value Date/Time   NA 138 12/11/2022 1257   NA 147 (H) 10/08/2017 1410   NA 141 02/17/2017 1132   K 4.4 12/11/2022 1257   K 4.6 10/08/2017 1410   K 5.3 (H) 02/17/2017 1132   CL 104 12/11/2022 1257   CL 106 10/08/2017 1410   CO2 28 12/11/2022 1257   CO2 30 10/08/2017 1410   CO2 29 02/17/2017 1132   BUN 20 12/11/2022 1257   BUN 15 10/08/2017 1410   BUN 17.8 02/17/2017 1132   CREATININE 0.79 12/11/2022 1257   CREATININE 1.0 10/08/2017 1410   CREATININE 0.8 02/17/2017 1132      Component Value Date/Time   CALCIUM 9.5 12/11/2022 1257   CALCIUM 9.0 10/08/2017 1410   CALCIUM 9.3 02/17/2017 1132   ALKPHOS 54 12/11/2022 1257   ALKPHOS 59 10/08/2017 1410   ALKPHOS 79 02/17/2017 1132   AST 21 12/11/2022 1257   AST 22 02/17/2017 1132   ALT 16 12/11/2022 1257   ALT 26 10/08/2017 1410   ALT 20 02/17/2017 1132   BILITOT 0.6 12/11/2022 1257   BILITOT 0.39 02/17/2017 1132       Impression and Plan: Ms. Labrum is a very pleasant 72 yo caucasian female with secondary polycythemia (JAK-2 negative) due to COPD. Partial phlebotomy today with replacement fluids after, Hct 37.7%.  Iron studied pending.  Follow-up in 6 weeks.   Lottie Dawson, NP 1/10/20241:41 PM

## 2022-12-11 NOTE — Progress Notes (Signed)
Natalie Hendrix presents today for phlebotomy per MD orders. Phlebotomy procedure started at 1418  and ended at 1428. 250 grams removed. Patient observed for 30 minutes with IVF after procedure without any incident. Patient tolerated procedure well. IV needle removed intact.

## 2022-12-12 LAB — IRON AND IRON BINDING CAPACITY (CC-WL,HP ONLY)
Iron: 61 ug/dL (ref 28–170)
Saturation Ratios: 12 % (ref 10.4–31.8)
TIBC: 503 ug/dL — ABNORMAL HIGH (ref 250–450)
UIBC: 442 ug/dL (ref 148–442)

## 2023-01-22 ENCOUNTER — Encounter: Payer: Self-pay | Admitting: Family

## 2023-01-22 ENCOUNTER — Other Ambulatory Visit: Payer: Self-pay

## 2023-01-22 ENCOUNTER — Inpatient Hospital Stay (HOSPITAL_BASED_OUTPATIENT_CLINIC_OR_DEPARTMENT_OTHER): Payer: Medicare HMO | Admitting: Family

## 2023-01-22 ENCOUNTER — Inpatient Hospital Stay: Payer: Medicare HMO

## 2023-01-22 ENCOUNTER — Inpatient Hospital Stay: Payer: Medicare HMO | Attending: Hematology & Oncology

## 2023-01-22 VITALS — BP 126/65 | HR 84 | Temp 98.4°F | Resp 20 | Ht 68.0 in | Wt 177.1 lb

## 2023-01-22 DIAGNOSIS — D500 Iron deficiency anemia secondary to blood loss (chronic): Secondary | ICD-10-CM

## 2023-01-22 DIAGNOSIS — D751 Secondary polycythemia: Secondary | ICD-10-CM | POA: Diagnosis not present

## 2023-01-22 DIAGNOSIS — R0602 Shortness of breath: Secondary | ICD-10-CM | POA: Diagnosis not present

## 2023-01-22 DIAGNOSIS — Z7902 Long term (current) use of antithrombotics/antiplatelets: Secondary | ICD-10-CM | POA: Insufficient documentation

## 2023-01-22 DIAGNOSIS — R5383 Other fatigue: Secondary | ICD-10-CM | POA: Insufficient documentation

## 2023-01-22 LAB — CBC WITH DIFFERENTIAL (CANCER CENTER ONLY)
Abs Immature Granulocytes: 0.01 10*3/uL (ref 0.00–0.07)
Basophils Absolute: 0 10*3/uL (ref 0.0–0.1)
Basophils Relative: 0 %
Eosinophils Absolute: 0 10*3/uL (ref 0.0–0.5)
Eosinophils Relative: 1 %
HCT: 36.6 % (ref 36.0–46.0)
Hemoglobin: 11.2 g/dL — ABNORMAL LOW (ref 12.0–15.0)
Immature Granulocytes: 0 %
Lymphocytes Relative: 22 %
Lymphs Abs: 0.7 10*3/uL (ref 0.7–4.0)
MCH: 22.8 pg — ABNORMAL LOW (ref 26.0–34.0)
MCHC: 30.6 g/dL (ref 30.0–36.0)
MCV: 74.5 fL — ABNORMAL LOW (ref 80.0–100.0)
Monocytes Absolute: 0.3 10*3/uL (ref 0.1–1.0)
Monocytes Relative: 8 %
Neutro Abs: 2.3 10*3/uL (ref 1.7–7.7)
Neutrophils Relative %: 69 %
Platelet Count: 241 10*3/uL (ref 150–400)
RBC: 4.91 MIL/uL (ref 3.87–5.11)
RDW: 15.5 % (ref 11.5–15.5)
WBC Count: 3.4 10*3/uL — ABNORMAL LOW (ref 4.0–10.5)
nRBC: 0 % (ref 0.0–0.2)

## 2023-01-22 LAB — CMP (CANCER CENTER ONLY)
ALT: 13 U/L (ref 0–44)
AST: 17 U/L (ref 15–41)
Albumin: 4.2 g/dL (ref 3.5–5.0)
Alkaline Phosphatase: 57 U/L (ref 38–126)
Anion gap: 7 (ref 5–15)
BUN: 13 mg/dL (ref 8–23)
CO2: 26 mmol/L (ref 22–32)
Calcium: 9.3 mg/dL (ref 8.9–10.3)
Chloride: 106 mmol/L (ref 98–111)
Creatinine: 0.7 mg/dL (ref 0.44–1.00)
GFR, Estimated: >60 mL/min (ref >60)
Glucose, Bld: 125 mg/dL — ABNORMAL HIGH (ref 70–99)
Potassium: 3.8 mmol/L (ref 3.5–5.1)
Sodium: 139 mmol/L (ref 135–145)
Total Bilirubin: 0.5 mg/dL (ref 0.3–1.2)
Total Protein: 6.5 g/dL (ref 6.5–8.1)

## 2023-01-22 LAB — FERRITIN: Ferritin: 4 ng/mL — ABNORMAL LOW (ref 11–307)

## 2023-01-22 NOTE — Progress Notes (Signed)
Hematology and Oncology Follow Up Visit  Natalie Hendrix [id number] 1951/08/05 72 y.o. 01/22/2023   Principle Diagnosis:  Secondary polycythemia - JAK2 negative   Current Therapy:        Phlebotomy to maintain hematocrit less than 38% Plavix 75 mg by mouth daily   Interim History:  Natalie Hendrix is here today for follow-up. She is under a lot of stress at home caring for her husband. He is currently bed bound and she is hoping the [place] with help with home health and PT. She states that the [place] feels he has cancer that has effected his bone marrow and they are waiting on lab results to confirm.  She is fatigued and notes SOB with over exertion. She rests when she can. She enjoys walking out into the woods and enjoying nature.  No fever, chills, n/v, cough, rash, chest pain, abdominal pain or changes in bowel or bladder habits.  No swelling, numbness or tingling in her extremities at this time.  No new falls, no syncope.  Appetite and hydration is good. Weight is stable at 177 lbs.   ECOG Performance Status: 2 - Symptomatic, <50% confined to bed  Medications:  Allergies as of 01/22/2023       Reactions   Epinephrine Palpitations   Raises heart rate   Flonase [fluticasone] Palpitations   Protriptyline Hcl Other (See Comments)   Severe constipation   Tamiflu [oseltamivir Phosphate] Other (See Comments)   "like I was having a stroke"   Tamiflu [oseltamivir] Anaphylaxis   Tizanidine Other (See Comments)   panic   Gabapentin Other (See Comments)   arthralgia Other reaction(s): Arthralgia (Joint Pain)   Other Hives, Rash   boiron acteane   Qvar [beclomethasone] Other (See Comments)   Spiriva [tiotropium Bromide Monohydrate] Rash   Tiotropium Rash   Zonegran Other (See Comments)   Mood swings   Advair Diskus [fluticasone-salmeterol] Rash   Baclofen Rash   Chlorzoxazone Rash   Lamotrigine Rash   Levofloxacin Rash   Protriptyline Rash   Ropinirole Rash   Tiotropium Bromide  Monohydrate Rash   Verapamil Rash        Medication List        Accurate as of January 22, 2023 12:56 PM. If you have any questions, ask your nurse or doctor.          albuterol 108 (90 Base) MCG/ACT inhaler Commonly known as: ProAir HFA Inhale 2 puffs into the lungs every 6 (six) hours as needed for wheezing or shortness of breath.   albuterol (2.5 MG/3ML) 0.083% nebulizer solution Commonly known as: PROVENTIL Take 3 mLs (2.5 mg total) by nebulization every 6 (six) hours as needed for wheezing or shortness of breath.   azelastine 0.1 % nasal spray Commonly known as: ASTELIN Place 1 spray into both nostrils 2 (two) times daily.   beclomethasone 42 MCG/SPRAY nasal spray Commonly known as: BECONASE-AQ 2 puffs each nostril once daily   benzonatate 100 MG capsule Commonly known as: TESSALON Take 1 capsule (100 mg total) by mouth every 6 (six) hours as needed for cough.   buPROPion 300 MG 24 hr tablet Commonly known as: WELLBUTRIN XL Take 1 tablet (300 mg total) by mouth daily with breakfast.   CALCIUM 1200 PO Take 1,250 mg by mouth daily.   Centrum Silver tablet Take 1 tablet by mouth daily.   clopidogrel 75 MG tablet Commonly known as: PLAVIX Take 1 tablet (75 mg total) by mouth daily.   cyclobenzaprine 10 MG  tablet Commonly known as: FLEXERIL Take 10 mg by mouth every 8 (eight) hours as needed.   ESTRACE VAGINAL 0.1 MG/GM vaginal cream Generic drug: estradiol Place 1 Applicatorful vaginally daily. Small amount each dose per pt   ezetimibe 10 MG tablet Commonly known as: Zetia TAKE 1 TABLET BY MOUTH DAILY.   fexofenadine 180 MG tablet Commonly known as: Allegra Allergy Take 1 tablet (180 mg total) by mouth daily.   Fish Oil 1200 MG Caps Take 1,200 mg by mouth at bedtime.   hydrOXYzine 10 MG tablet Commonly known as: ATARAX Take 10 mg by mouth 3 (three) times daily as needed.   hyoscyamine 0.125 MG Tbdp disintergrating tablet Commonly known as:  ANASPAZ Place 1 tablet (0.125 mg total) under the tongue 2 (two) times daily as needed. What changed: reasons to take this   lidocaine 5 % Commonly known as: LIDODERM Place 1 patch onto the skin as needed (pain). Remove & Discard patch within 12 hours or as directed by MD   LORazepam 1 MG tablet Commonly known as: ATIVAN Take 1 tablet (1 mg total) by mouth 2 (two) times daily. May take extra 3rd dose if needed What changed:  when to take this reasons to take this   magnesium gluconate 500 MG tablet Commonly known as: MAGONATE Take 500 mg by mouth daily as needed (constipation).   meclizine 25 MG tablet Commonly known as: ANTIVERT Take 1 tablet (25 mg total) by mouth 3 (three) times daily as needed for dizziness. 3 month supply What changed: when to take this   metoprolol succinate 50 MG 24 hr tablet Commonly known as: TOPROL-XL Take 1 tablet (50 mg total) by mouth daily with breakfast. What changed: how much to take   morphine 15 MG tablet Commonly known as: MSIR Take 15 mg by mouth every 4 (four) hours as needed for severe pain.   morphine 30 MG 12 hr tablet Commonly known as: MS CONTIN Take 1 tablet by mouth every 12 (twelve) hours.   Nebulizer Devi Use as directed   OXYGEN Inhale 2.5 L/hr into the lungs continuous.   pantoprazole 40 MG tablet Commonly known as: PROTONIX TAKE 1 TABLET (40 MG TOTAL) BY MOUTH DAILY.   Polyethyl Glycol-Propyl Glycol 0.4-0.3 % Soln Apply 1 drop to eye daily as needed (dry eyes).   pravastatin 40 MG tablet Commonly known as: PRAVACHOL Take 1 tablet (40 mg total) by mouth at bedtime.   promethazine 25 MG tablet Commonly known as: PHENERGAN Take 25 mg by mouth every 8 (eight) hours as needed for nausea or vomiting.   Zovirax 5 % Generic drug: acyclovir ointment Apply 1 application topically as needed (flair).        Allergies:  Allergies  Allergen Reactions   Epinephrine Palpitations    Raises heart rate    Flonase  [Fluticasone] Palpitations   Protriptyline Hcl Other (See Comments)    Severe constipation   Tamiflu [Oseltamivir Phosphate] Other (See Comments)    "like I was having a stroke"   Tamiflu [Oseltamivir] Anaphylaxis   Tizanidine Other (See Comments)    panic   Gabapentin Other (See Comments)    arthralgia Other reaction(s): Arthralgia (Joint Pain)   Other Hives and Rash    boiron acteane   Qvar [Beclomethasone] Other (See Comments)   Spiriva [Tiotropium Bromide Monohydrate] Rash   Tiotropium Rash   Zonegran Other (See Comments)    Mood swings   Advair Diskus [Fluticasone-Salmeterol] Rash   Baclofen Rash  Chlorzoxazone Rash   Lamotrigine Rash   Levofloxacin Rash   Protriptyline Rash   Ropinirole Rash   Tiotropium Bromide Monohydrate Rash   Verapamil Rash    Past Medical History, Surgical history, Social history, and Family History were reviewed and updated.  Review of Systems: All other 10 point review of systems is negative.   Physical Exam:  height is 5' 8"$  (1.727 m) and weight is 177 lb 1.3 oz (80.3 kg). Her oral temperature is 98.4 F (36.9 C). Her blood pressure is 126/65 and her pulse is 84. Her respiration is 20 and oxygen saturation is 98%.   Wt Readings from Last 3 Encounters:  01/22/23 177 lb 1.3 oz (80.3 kg)  12/11/22 182 lb 12.8 oz (82.9 kg)  10/09/22 188 lb 0.6 oz (85.3 kg)    Ocular: Sclerae unicteric, pupils equal, round and reactive to light Ear-nose-throat: Oropharynx clear, dentition fair Lymphatic: No cervical or supraclavicular adenopathy Lungs no rales or rhonchi, good excursion bilaterally Heart regular rate and rhythm, no murmur appreciated Abd soft, nontender, positive bowel sounds MSK no focal spinal tenderness, no joint edema Neuro: non-focal, well-oriented, appropriate affect Breasts: Deferred  Lab Results  Component Value Date   WBC 3.4 (L) 01/22/2023   HGB 11.2 (L) 01/22/2023   HCT 36.6 01/22/2023   MCV 74.5 (L) 01/22/2023   PLT  241 01/22/2023   Lab Results  Component Value Date   FERRITIN 4 (L) 12/11/2022   IRON 61 12/11/2022   TIBC 503 (H) 12/11/2022   UIBC 442 12/11/2022   IRONPCTSAT 12 12/11/2022   Lab Results  Component Value Date   RETICCTPCT 1.1 04/27/2015   RBC 4.91 01/22/2023   RETICCTABS 55.1 04/27/2015   No results found for: "KPAFRELGTCHN", "LAMBDASER", "KAPLAMBRATIO" No results found for: "IGGSERUM", "IGA", "IGMSERUM" No results found for: "TOTALPROTELP", "ALBUMINELP", "A1GS", "A2GS", "BETS", "BETA2SER", "GAMS", "MSPIKE", "SPEI"   Chemistry      Component Value Date/Time   NA 138 12/11/2022 1257   NA 147 (H) 10/08/2017 1410   NA 141 02/17/2017 1132   K 4.4 12/11/2022 1257   K 4.6 10/08/2017 1410   K 5.3 (H) 02/17/2017 1132   CL 104 12/11/2022 1257   CL 106 10/08/2017 1410   CO2 28 12/11/2022 1257   CO2 30 10/08/2017 1410   CO2 29 02/17/2017 1132   BUN 20 12/11/2022 1257   BUN 15 10/08/2017 1410   BUN 17.8 02/17/2017 1132   CREATININE 0.79 12/11/2022 1257   CREATININE 1.0 10/08/2017 1410   CREATININE 0.8 02/17/2017 1132      Component Value Date/Time   CALCIUM 9.5 12/11/2022 1257   CALCIUM 9.0 10/08/2017 1410   CALCIUM 9.3 02/17/2017 1132   ALKPHOS 54 12/11/2022 1257   ALKPHOS 59 10/08/2017 1410   ALKPHOS 79 02/17/2017 1132   AST 21 12/11/2022 1257   AST 22 02/17/2017 1132   ALT 16 12/11/2022 1257   ALT 26 10/08/2017 1410   ALT 20 02/17/2017 1132   BILITOT 0.6 12/11/2022 1257   BILITOT 0.39 02/17/2017 1132       Impression and Plan: Natalie Hendrix is a very pleasant 72 yo caucasian female with secondary polycythemia (JAK-2 negative) due to COPD. No phlebotomy, Hct 36.6%.  Iron studied pending.  Follow-up in 6 weeks.   Lottie Dawson, NP 2/21/202412:56 PM

## 2023-01-23 LAB — IRON AND IRON BINDING CAPACITY (CC-WL,HP ONLY)
Iron: 39 ug/dL (ref 28–170)
Saturation Ratios: 7 % — ABNORMAL LOW (ref 10.4–31.8)
TIBC: 529 ug/dL — ABNORMAL HIGH (ref 250–450)
UIBC: 490 ug/dL — ABNORMAL HIGH (ref 148–442)

## 2023-01-27 ENCOUNTER — Ambulatory Visit: Payer: Medicare HMO | Attending: Cardiology | Admitting: Cardiology

## 2023-01-27 NOTE — Progress Notes (Deleted)
Primary Care Provider: Hayden Rasmussen, MD [place] Cardiologist: Glenetta Hew, MD Electrophysiologist: None  Clinic Note: No chief complaint on file.   ===================================  ASSESSMENT/PLAN   Problem List Items Addressed This Visit       Cardiology Problems   HLD (hyperlipidemia) (Chronic)   Essential hypertension - Primary (Chronic)    ===================================  HPI:    Natalie Hendrix is a 72 y.o. female with a PMH below who presents today for 38-monthfollow-up.  I last saw Natalie FESSENDENback in January 2021:   [name] recently seen on September 24, 2022 by EFinis Bud NP for evaluation of palpitations.  Still wearing 2 L of oxygen, but denied any chest pain or worsening dyspnea.  No notable palpitations.  No syncope or near syncope.  Still having some atypical right-sided chest pain no notable symptoms.  No changes to Toprol or pravastatin plus Zetia.  Recent Hospitalizations: ***  Reviewed  CV studies:    The following studies were reviewed today: (if available, images/films reviewed: From Epic Chart or Care Everywhere) ***:  Interval History:   Natalie Hendrix  CV Review of Symptoms (Summary): Cardiovascular ROS: {[id number]}  REVIEWED OF SYSTEMS   ROS  I have reviewed and (if needed) personally updated the patient's problem list, medications, allergies, past medical and surgical history, social and family history.   PAST MEDICAL HISTORY   Past Medical History:  Diagnosis Date   Allergic rhinitis    Allergy    Anxiety    Asthma    uses oxygen 2.5 l/m 24/7, sleeps elevated   Complication of anesthesia    very sensitive to sedatives, B/P drops   [place]re[place]   residual left side weakness, dysphagia, word finding difficulty, and short term memory; uses mobile chair, cane   Dependence on supplemental oxygen    Depression    Dizziness    Fibromyalgia    Fluttering  heart    GERD (gastroesophageal reflux disease)    Hyperlipidemia    Hypertension    Migraine headache    Migraine, unspecified, not intractable, without status migrainosus    Other amnesia    [place]    Radiculopathy, site unspecified    Thoracic outlet syndrome 1981   repair   TIA (transient ischemic attack)    Unspecified chronic bronchitis ([place]    Unspecified dementia without behavioral disturbance    Vertigo     PAST SURGICAL HISTORY   Past Surgical History:  Procedure Laterality Date   APPENDECTOMY     BALLOON DILATION N/A 10/27/2013   Procedure: BALLOON DILATION;  Surgeon: WArta Silence MD;  Location: [facility];  Service: Endoscopy;  Laterality: N/A;   BREAST SURGERY  1979   CHOLECYSTECTOMY     CYST REMOVAL HAND Bilateral    left done 10-13-13(Dr. Gramig)   DILATION AND CURETTAGE OF UTERUS     ESOPHAGOGASTRODUODENOSCOPY (EGD) WITH PROPOFOL N/A 10/27/2013   Procedure: ESOPHAGOGASTRODUODENOSCOPY (EGD) WITH PROPOFOL;  Surgeon: WArta Silence MD;  Location: [facility];  Service: Endoscopy;  Laterality: N/A;   GALLBLADDER SURGERY     KNEE SURGERY     repair torn ligament/capsule knee cruciat   REDUCTION MAMMAPLASTY     RESECTION RIB PARTIAL     RHINOPLASTY     Root Resection and Revascularization  1980   of Long thoracic artery   scalenectomy     synonectomy     TRANSTHORACIC ECHOCARDIOGRAM  12/2019   Normal LV function.  EF 66 5%.  Grade 1 diastolic dysfunction.  Normal echocardiogram for age   MEDICATIONS/ALLERGIES   No outpatient medications have been marked as taking for the 01/27/23 encounter (Appointment) with Leonie Man, MD.    Allergies  Allergen Reactions   Epinephrine Palpitations    Raises heart rate    Flonase [Fluticasone] Palpitations   Protriptyline Hcl Other (See Comments)    Severe constipation   Tamiflu [Oseltamivir Phosphate] Other (See Comments)    "like I was having a stroke"   Tamiflu [Oseltamivir]  Anaphylaxis   Tizanidine Other (See Comments)    panic   Gabapentin Other (See Comments)    arthralgia Other reaction(s): Arthralgia (Joint Pain)   Other Hives and Rash    boiron acteane   Qvar [Beclomethasone] Other (See Comments)   Spiriva [Tiotropium Bromide Monohydrate] Rash   Tiotropium Rash   Zonegran Other (See Comments)    Mood swings   Advair Diskus [Fluticasone-Salmeterol] Rash   Baclofen Rash   Chlorzoxazone Rash   Lamotrigine Rash   Levofloxacin Rash   Protriptyline Rash   Ropinirole Rash   Tiotropium Bromide Monohydrate Rash   Verapamil Rash    SOCIAL HISTORY/FAMILY HISTORY   Reviewed in Epic:  Pertinent findings:  Social History   Tobacco Use   Smoking status: Never   Smokeless tobacco: Never   Tobacco comments:    never used tobacco; secondary smoke exposure  Vaping Use   Vaping Use: Never used  Substance Use Topics   Alcohol use: Not Currently    Alcohol/week: 0.0 standard drinks of alcohol    Comment: seldom, wine @ christmas   Drug use: No   Social History   Social History Narrative   Lives at home with husband  Engineer, technical sales) and service dog   Right handed   Caffeine: tries to avoid     OBJCTIVE -PE, EKG, labs   Wt Readings from Last 3 Encounters:  01/22/23 177 lb 1.3 oz (80.3 kg)  12/11/22 182 lb 12.8 oz (82.9 kg)  10/09/22 188 lb 0.6 oz (85.3 kg)    Physical Exam: There were no vitals taken for this visit. Physical Exam   Adult ECG Report  Rate: *** ;  Rhythm: {rhythm:17366};   Narrative Interpretation: ***  Recent Labs:  ***  Lab Results  Component Value Date   CHOL 152 02/15/2021   HDL 58 02/15/2021   LDLCALC 58 02/15/2021   TRIG 181 (H) 02/15/2021   CHOLHDL 2.6 02/15/2021   Lab Results  Component Value Date   CREATININE 0.70 01/22/2023   BUN 13 01/22/2023   NA 139 01/22/2023   K 3.8 01/22/2023   CL 106 01/22/2023   CO2 26 01/22/2023      Latest Ref Rng & Units 01/22/2023   12:13 PM 12/11/2022   12:57 PM 10/09/2022     9:41 AM  CBC  WBC 4.0 - 10.5 K/uL 3.4  4.2  5.6   Hemoglobin 12.0 - 15.0 g/dL 11.2  11.4  11.7   Hematocrit 36.0 - 46.0 % 36.6  37.7  39.9   Platelets 150 - 400 K/uL 241  212  288     Lab Results  Component Value Date   HGBA1C 5.9 10/17/2016   Lab Results  Component Value Date   TSH 1.29 10/17/2016    ================================================== I spent a total of ***minutes with the patient spent in direct patient consultation.  Additional time spent with chart review  /  charting (studies, outside notes, etc): *** min Total Time: *** min  Current medicines are reviewed at length with the patient today.  (+/- concerns) ***  Notice: This dictation was prepared with Dragon dictation along with smart phrase technology. Any transcriptional errors that result from this process are unintentional and may not be corrected upon review.  Studies Ordered:   No orders of the defined types were placed in this encounter.  No orders of the defined types were placed in this encounter.   Patient Instructions / Medication Changes & Studies & Tests Ordered   There are no Patient Instructions on file for this visit.     Leonie Man, MD, MS Glenetta Hew, M.D., M.S. Interventional Cardiologist  [place]  Pager # [phone] Phone # [phone] [address]. [place], [place] [address]   Thank you for choosing [facility]!!

## 2023-03-05 ENCOUNTER — Inpatient Hospital Stay: Payer: Medicare HMO | Attending: Hematology & Oncology

## 2023-03-05 ENCOUNTER — Inpatient Hospital Stay: Payer: Medicare HMO

## 2023-03-05 ENCOUNTER — Inpatient Hospital Stay (HOSPITAL_BASED_OUTPATIENT_CLINIC_OR_DEPARTMENT_OTHER): Payer: Medicare HMO | Admitting: Family

## 2023-03-05 VITALS — BP 127/64 | HR 85 | Temp 97.8°F | Resp 17 | Ht 68.0 in | Wt 172.0 lb

## 2023-03-05 VITALS — BP 101/67 | HR 75 | Resp 17

## 2023-03-05 DIAGNOSIS — D751 Secondary polycythemia: Secondary | ICD-10-CM | POA: Insufficient documentation

## 2023-03-05 DIAGNOSIS — R1011 Right upper quadrant pain: Secondary | ICD-10-CM

## 2023-03-05 DIAGNOSIS — Z5189 Encounter for other specified aftercare: Secondary | ICD-10-CM | POA: Insufficient documentation

## 2023-03-05 DIAGNOSIS — D45 Polycythemia vera: Secondary | ICD-10-CM

## 2023-03-05 DIAGNOSIS — R5383 Other fatigue: Secondary | ICD-10-CM | POA: Insufficient documentation

## 2023-03-05 DIAGNOSIS — Z862 Personal history of diseases of the blood and blood-forming organs and certain disorders involving the immune mechanism: Secondary | ICD-10-CM | POA: Diagnosis not present

## 2023-03-05 DIAGNOSIS — D500 Iron deficiency anemia secondary to blood loss (chronic): Secondary | ICD-10-CM

## 2023-03-05 LAB — CBC WITH DIFFERENTIAL (CANCER CENTER ONLY)
Abs Immature Granulocytes: 0.02 10*3/uL (ref 0.00–0.07)
Basophils Absolute: 0 10*3/uL (ref 0.0–0.1)
Basophils Relative: 1 %
Eosinophils Absolute: 0.1 10*3/uL (ref 0.0–0.5)
Eosinophils Relative: 2 %
HCT: 39.2 % (ref 36.0–46.0)
Hemoglobin: 11.9 g/dL — ABNORMAL LOW (ref 12.0–15.0)
Immature Granulocytes: 0 %
Lymphocytes Relative: 19 %
Lymphs Abs: 0.9 10*3/uL (ref 0.7–4.0)
MCH: 23.1 pg — ABNORMAL LOW (ref 26.0–34.0)
MCHC: 30.4 g/dL (ref 30.0–36.0)
MCV: 76 fL — ABNORMAL LOW (ref 80.0–100.0)
Monocytes Absolute: 0.4 10*3/uL (ref 0.1–1.0)
Monocytes Relative: 8 %
Neutro Abs: 3.3 10*3/uL (ref 1.7–7.7)
Neutrophils Relative %: 70 %
Platelet Count: 244 10*3/uL (ref 150–400)
RBC: 5.16 MIL/uL — ABNORMAL HIGH (ref 3.87–5.11)
RDW: 16.6 % — ABNORMAL HIGH (ref 11.5–15.5)
WBC Count: 4.7 10*3/uL (ref 4.0–10.5)
nRBC: 0 % (ref 0.0–0.2)

## 2023-03-05 LAB — CMP (CANCER CENTER ONLY)
ALT: 20 U/L (ref 0–44)
AST: 27 U/L (ref 15–41)
Albumin: 4.1 g/dL (ref 3.5–5.0)
Alkaline Phosphatase: 54 U/L (ref 38–126)
Anion gap: 7 (ref 5–15)
BUN: 20 mg/dL (ref 8–23)
CO2: 29 mmol/L (ref 22–32)
Calcium: 9.3 mg/dL (ref 8.9–10.3)
Chloride: 106 mmol/L (ref 98–111)
Creatinine: 0.72 mg/dL (ref 0.44–1.00)
GFR, Estimated: >60 mL/min (ref >60)
Glucose, Bld: 105 mg/dL — ABNORMAL HIGH (ref 70–99)
Potassium: 4.2 mmol/L (ref 3.5–5.1)
Sodium: 142 mmol/L (ref 135–145)
Total Bilirubin: 0.6 mg/dL (ref 0.3–1.2)
Total Protein: 6.5 g/dL (ref 6.5–8.1)

## 2023-03-05 LAB — IRON AND IRON BINDING CAPACITY (CC-WL,HP ONLY)
Iron: 50 ug/dL (ref 28–170)
Saturation Ratios: 10 % — ABNORMAL LOW (ref 10.4–31.8)
TIBC: 496 ug/dL — ABNORMAL HIGH (ref 250–450)
UIBC: 446 ug/dL — ABNORMAL HIGH (ref 148–442)

## 2023-03-05 LAB — FERRITIN: Ferritin: 6 ng/mL — ABNORMAL LOW (ref 11–307)

## 2023-03-05 MED ORDER — SODIUM CHLORIDE 0.9 % IV SOLN: Freq: Once | INTRAVENOUS | Status: AC

## 2023-03-05 NOTE — Progress Notes (Signed)
Hematology and Oncology Follow Up Visit  Natalie Hendrix [id number] 09-24-1951 72 y.o. 03/05/2023   Principle Diagnosis:  Secondary polycythemia - JAK2 negative   Current Therapy:        Phlebotomy to maintain hematocrit less than 38% Plavix 75 mg by mouth daily   Interim History:  Natalie Hendrix is here today for follow-up and phlebotomy. She is feeling fatigued, notes increased joint aches and pains and more frequent headaches.  SOB stable at this time. She uses supplemental O2 as needed.  No fever, chills, n/v, cough, rash, dizziness, chest pain, palpitations, abdominal pain or changes in bowel or bladder habits.  No bruising or petechiae.  No swelling in her extremities.  Neuropathy unchanged from baseline.  No falls or syncope.  Appetite and hydration are good. Weight is stable at 172 lbs.  She is staying busy caring for her husband. This is been hard on her at times.   ECOG Performance Status: 1 - Symptomatic but completely ambulatory  Medications:  Allergies as of 03/05/2023       Reactions   Epinephrine Palpitations   Raises heart rate   Flonase [fluticasone] Palpitations   Protriptyline Hcl Other (See Comments)   Severe constipation   Tamiflu [oseltamivir Phosphate] Other (See Comments)   "like I was having a stroke"   Tamiflu [oseltamivir] Anaphylaxis   Tizanidine Other (See Comments)   panic   Gabapentin Other (See Comments)   arthralgia Other reaction(s): Arthralgia (Joint Pain)   Other Hives, Rash   boiron acteane   Qvar [beclomethasone] Other (See Comments)   Spiriva [tiotropium Bromide Monohydrate] Rash   Tiotropium Rash   Zonegran Other (See Comments)   Mood swings   Advair Diskus [fluticasone-salmeterol] Rash   Baclofen Rash   Chlorzoxazone Rash   Lamotrigine Rash   Levofloxacin Rash   Protriptyline Rash   Ropinirole Rash   Tiotropium Bromide Monohydrate Rash   Verapamil Rash        Medication List        Accurate as of March 05, 2023 10:39  AM. If you have any questions, ask your nurse or doctor.          albuterol 108 (90 Base) MCG/ACT inhaler Commonly known as: ProAir HFA Inhale 2 puffs into the lungs every 6 (six) hours as needed for wheezing or shortness of breath.   albuterol (2.5 MG/3ML) 0.083% nebulizer solution Commonly known as: PROVENTIL Take 3 mLs (2.5 mg total) by nebulization every 6 (six) hours as needed for wheezing or shortness of breath.   azelastine 0.1 % nasal spray Commonly known as: ASTELIN Place 1 spray into both nostrils 2 (two) times daily.   beclomethasone 42 MCG/SPRAY nasal spray Commonly known as: BECONASE-AQ 2 puffs each nostril once daily   benzonatate 100 MG capsule Commonly known as: TESSALON Take 1 capsule (100 mg total) by mouth every 6 (six) hours as needed for cough.   buPROPion 300 MG 24 hr tablet Commonly known as: WELLBUTRIN XL Take 1 tablet (300 mg total) by mouth daily with breakfast.   CALCIUM 1200 PO Take 1,250 mg by mouth daily.   Centrum Silver tablet Take 1 tablet by mouth daily.   clopidogrel 75 MG tablet Commonly known as: PLAVIX Take 1 tablet (75 mg total) by mouth daily.   cyclobenzaprine 10 MG tablet Commonly known as: FLEXERIL Take 10 mg by mouth every 8 (eight) hours as needed.   ESTRACE VAGINAL 0.1 MG/GM vaginal cream Generic drug: estradiol Place 1 Applicatorful vaginally  daily. Small amount each dose per pt   ezetimibe 10 MG tablet Commonly known as: Zetia TAKE 1 TABLET BY MOUTH DAILY.   fexofenadine 180 MG tablet Commonly known as: Allegra Allergy Take 1 tablet (180 mg total) by mouth daily.   Fish Oil 1200 MG Caps Take 1,200 mg by mouth at bedtime.   hydrOXYzine 10 MG tablet Commonly known as: ATARAX Take 10 mg by mouth 3 (three) times daily as needed.   hyoscyamine 0.125 MG Tbdp disintergrating tablet Commonly known as: ANASPAZ Place 1 tablet (0.125 mg total) under the tongue 2 (two) times daily as needed. What changed: reasons to  take this   lidocaine 5 % Commonly known as: LIDODERM Place 1 patch onto the skin as needed (pain). Remove & Discard patch within 12 hours or as directed by MD   LORazepam 1 MG tablet Commonly known as: ATIVAN Take 1 tablet (1 mg total) by mouth 2 (two) times daily. May take extra 3rd dose if needed What changed:  when to take this reasons to take this   magnesium gluconate 500 MG tablet Commonly known as: MAGONATE Take 500 mg by mouth daily as needed (constipation).   meclizine 25 MG tablet Commonly known as: ANTIVERT Take 1 tablet (25 mg total) by mouth 3 (three) times daily as needed for dizziness. 3 month supply What changed: when to take this   metoprolol succinate 50 MG 24 hr tablet Commonly known as: TOPROL-XL Take 1 tablet (50 mg total) by mouth daily with breakfast. What changed: how much to take   morphine 15 MG tablet Commonly known as: MSIR Take 15 mg by mouth every 4 (four) hours as needed for severe pain.   morphine 30 MG 12 hr tablet Commonly known as: MS CONTIN Take 1 tablet by mouth every 12 (twelve) hours.   Nebulizer Devi Use as directed   OXYGEN Inhale 2.5 L/hr into the lungs continuous.   pantoprazole 40 MG tablet Commonly known as: PROTONIX TAKE 1 TABLET (40 MG TOTAL) BY MOUTH DAILY.   Polyethyl Glycol-Propyl Glycol 0.4-0.3 % Soln Apply 1 drop to eye daily as needed (dry eyes).   pravastatin 40 MG tablet Commonly known as: PRAVACHOL Take 1 tablet (40 mg total) by mouth at bedtime.   promethazine 25 MG tablet Commonly known as: PHENERGAN Take 25 mg by mouth every 8 (eight) hours as needed for nausea or vomiting.   Zovirax 5 % Generic drug: acyclovir ointment Apply 1 application topically as needed (flair).        Allergies:  Allergies  Allergen Reactions   Epinephrine Palpitations    Raises heart rate    Flonase [Fluticasone] Palpitations   Protriptyline Hcl Other (See Comments)    Severe constipation   Tamiflu [Oseltamivir  Phosphate] Other (See Comments)    "like I was having a stroke"   Tamiflu [Oseltamivir] Anaphylaxis   Tizanidine Other (See Comments)    panic   Gabapentin Other (See Comments)    arthralgia Other reaction(s): Arthralgia (Joint Pain)   Other Hives and Rash    boiron acteane   Qvar [Beclomethasone] Other (See Comments)   Spiriva [Tiotropium Bromide Monohydrate] Rash   Tiotropium Rash   Zonegran Other (See Comments)    Mood swings   Advair Diskus [Fluticasone-Salmeterol] Rash   Baclofen Rash   Chlorzoxazone Rash   Lamotrigine Rash   Levofloxacin Rash   Protriptyline Rash   Ropinirole Rash   Tiotropium Bromide Monohydrate Rash   Verapamil Rash  Past Medical History, Surgical history, Social history, and Family History were reviewed and updated.  Review of Systems: All other 10 point review of systems is negative.   Physical Exam:  height is 5\' 8"  (1.727 m) and weight is 172 lb (78 kg). Her oral temperature is 97.8 F (36.6 C). Her blood pressure is 127/64 and her pulse is 85. Her respiration is 17 and oxygen saturation is 100%.   Wt Readings from Last 3 Encounters:  03/05/23 172 lb (78 kg)  01/22/23 177 lb 1.3 oz (80.3 kg)  12/11/22 182 lb 12.8 oz (82.9 kg)    Ocular: Sclerae unicteric, pupils equal, round and reactive to light Ear-nose-throat: Oropharynx clear, dentition fair Lymphatic: No cervical or supraclavicular adenopathy Lungs no rales or rhonchi, good excursion bilaterally Heart regular rate and rhythm, no murmur appreciated Abd soft, nontender, positive bowel sounds MSK no focal spinal tenderness, no joint edema Neuro: non-focal, well-oriented, appropriate affect Breasts: Deferred   Lab Results  Component Value Date   WBC 4.7 03/05/2023   HGB 11.9 (L) 03/05/2023   HCT 39.2 03/05/2023   MCV 76.0 (L) 03/05/2023   PLT 244 03/05/2023   Lab Results  Component Value Date   FERRITIN 4 (L) 01/22/2023   IRON 39 01/22/2023   TIBC 529 (H) 01/22/2023    UIBC 490 (H) 01/22/2023   IRONPCTSAT 7 (L) 01/22/2023   Lab Results  Component Value Date   RETICCTPCT 1.1 04/27/2015   RBC 5.16 (H) 03/05/2023   RETICCTABS 55.1 04/27/2015   No results found for: "KPAFRELGTCHN", "LAMBDASER", "KAPLAMBRATIO" No results found for: "IGGSERUM", "IGA", "IGMSERUM" No results found for: "TOTALPROTELP", "ALBUMINELP", "A1GS", "A2GS", "BETS", "BETA2SER", "GAMS", "MSPIKE", "SPEI"   Chemistry      Component Value Date/Time   NA 139 01/22/2023 1213   NA 147 (H) 10/08/2017 1410   NA 141 02/17/2017 1132   K 3.8 01/22/2023 1213   K 4.6 10/08/2017 1410   K 5.3 (H) 02/17/2017 1132   CL 106 01/22/2023 1213   CL 106 10/08/2017 1410   CO2 26 01/22/2023 1213   CO2 30 10/08/2017 1410   CO2 29 02/17/2017 1132   BUN 13 01/22/2023 1213   BUN 15 10/08/2017 1410   BUN 17.8 02/17/2017 1132   CREATININE 0.70 01/22/2023 1213   CREATININE 1.0 10/08/2017 1410   CREATININE 0.8 02/17/2017 1132      Component Value Date/Time   CALCIUM 9.3 01/22/2023 1213   CALCIUM 9.0 10/08/2017 1410   CALCIUM 9.3 02/17/2017 1132   ALKPHOS 57 01/22/2023 1213   ALKPHOS 59 10/08/2017 1410   ALKPHOS 79 02/17/2017 1132   AST 17 01/22/2023 1213   AST 22 02/17/2017 1132   ALT 13 01/22/2023 1213   ALT 26 10/08/2017 1410   ALT 20 02/17/2017 1132   BILITOT 0.5 01/22/2023 1213   BILITOT 0.39 02/17/2017 1132       Impression and Plan: Natalie Hendrix is a very pleasant 72 yo caucasian female with secondary polycythemia (JAK-2 negative) due to COPD. We will proceed with phlebotomy and replacement fluids, Hct 39.2%.  Iron studied pending.  We will get ah [place] of her belly to assess for cause of right upper quadrant pain.  Follow-up in 6 weeks.   Lottie Dawson, NP 4/3/202410:39 AM

## 2023-03-05 NOTE — Progress Notes (Signed)
Natalie Hendrix presents today for phlebotomy per MD orders. Phlebotomy procedure started at 1140 and ended at 1157. 505 grams removed via 20 gauge needle to left AC. Patient observed for 30 minutes after procedure without any incident while receiving IVF per orders. Patient tolerated procedure well. IV needle removed intact.

## 2023-03-05 NOTE — Patient Instructions (Signed)

## 2023-03-15 ENCOUNTER — Ambulatory Visit (HOSPITAL_BASED_OUTPATIENT_CLINIC_OR_DEPARTMENT_OTHER): Admission: RE | Admit: 2023-03-15 | Payer: Medicare HMO | Source: Ambulatory Visit

## 2023-03-20 ENCOUNTER — Ambulatory Visit (HOSPITAL_BASED_OUTPATIENT_CLINIC_OR_DEPARTMENT_OTHER): Payer: Medicare HMO

## 2023-03-28 ENCOUNTER — Ambulatory Visit (HOSPITAL_BASED_OUTPATIENT_CLINIC_OR_DEPARTMENT_OTHER): Payer: Medicare HMO

## 2023-03-29 ENCOUNTER — Ambulatory Visit (HOSPITAL_BASED_OUTPATIENT_CLINIC_OR_DEPARTMENT_OTHER): Admission: RE | Admit: 2023-03-29 | Payer: Medicare HMO | Source: Ambulatory Visit

## 2023-04-02 ENCOUNTER — Other Ambulatory Visit: Payer: Self-pay

## 2023-04-02 ENCOUNTER — Emergency Department (HOSPITAL_COMMUNITY): Payer: Medicare HMO

## 2023-04-02 ENCOUNTER — Emergency Department (HOSPITAL_COMMUNITY)
Admission: EM | Admit: 2023-04-02 | Discharge: 2023-04-02 | Disposition: A | Discharge status: Home / Self Care | Payer: Medicare HMO | Attending: Emergency Medicine | Admitting: Emergency Medicine

## 2023-04-02 DIAGNOSIS — S0083XA Contusion of other part of head, initial encounter: Secondary | ICD-10-CM | POA: Diagnosis not present

## 2023-04-02 DIAGNOSIS — Y92410 Unspecified street and highway as the place of occurrence of the external cause: Secondary | ICD-10-CM | POA: Diagnosis not present

## 2023-04-02 DIAGNOSIS — M25561 Pain in right knee: Secondary | ICD-10-CM | POA: Insufficient documentation

## 2023-04-02 DIAGNOSIS — Z79899 Other long term (current) drug therapy: Secondary | ICD-10-CM | POA: Insufficient documentation

## 2023-04-02 DIAGNOSIS — N83201 Unspecified ovarian cyst, right side: Secondary | ICD-10-CM | POA: Insufficient documentation

## 2023-04-02 DIAGNOSIS — M79629 Pain in unspecified upper arm: Secondary | ICD-10-CM | POA: Diagnosis not present

## 2023-04-02 DIAGNOSIS — J9811 Atelectasis: Secondary | ICD-10-CM | POA: Diagnosis not present

## 2023-04-02 DIAGNOSIS — M545 Low back pain, unspecified: Secondary | ICD-10-CM | POA: Insufficient documentation

## 2023-04-02 DIAGNOSIS — S0990XA Unspecified injury of head, initial encounter: Secondary | ICD-10-CM | POA: Diagnosis present

## 2023-04-02 LAB — COMPREHENSIVE METABOLIC PANEL
ALT: 16 U/L (ref 0–44)
AST: 25 U/L (ref 15–41)
Albumin: 3.3 g/dL — ABNORMAL LOW (ref 3.5–5.0)
Alkaline Phosphatase: 51 U/L (ref 38–126)
Anion gap: 8 (ref 5–15)
BUN: 20 mg/dL (ref 8–23)
CO2: 25 mmol/L (ref 22–32)
Calcium: 8.7 mg/dL — ABNORMAL LOW (ref 8.9–10.3)
Chloride: 107 mmol/L (ref 98–111)
Creatinine, Ser: 0.74 mg/dL (ref 0.44–1.00)
GFR, Estimated: >60 mL/min (ref >60)
Glucose, Bld: 98 mg/dL (ref 70–99)
Potassium: 4 mmol/L (ref 3.5–5.1)
Sodium: 140 mmol/L (ref 135–145)
Total Bilirubin: 0.4 mg/dL (ref 0.3–1.2)
Total Protein: 5.9 g/dL — ABNORMAL LOW (ref 6.5–8.1)

## 2023-04-02 LAB — CBC WITH DIFFERENTIAL/PLATELET
Abs Immature Granulocytes: 0.01 10*3/uL (ref 0.00–0.07)
Basophils Absolute: 0 10*3/uL (ref 0.0–0.1)
Basophils Relative: 1 %
Eosinophils Absolute: 0.1 10*3/uL (ref 0.0–0.5)
Eosinophils Relative: 2 %
HCT: 33.3 % — ABNORMAL LOW (ref 36.0–46.0)
Hemoglobin: 9.9 g/dL — ABNORMAL LOW (ref 12.0–15.0)
Immature Granulocytes: 0 %
Lymphocytes Relative: 24 %
Lymphs Abs: 1.1 10*3/uL (ref 0.7–4.0)
MCH: 22.8 pg — ABNORMAL LOW (ref 26.0–34.0)
MCHC: 29.7 g/dL — ABNORMAL LOW (ref 30.0–36.0)
MCV: 76.7 fL — ABNORMAL LOW (ref 80.0–100.0)
Monocytes Absolute: 0.4 10*3/uL (ref 0.1–1.0)
Monocytes Relative: 10 %
Neutro Abs: 2.8 10*3/uL (ref 1.7–7.7)
Neutrophils Relative %: 63 %
Platelets: 175 10*3/uL (ref 150–400)
RBC: 4.34 MIL/uL (ref 3.87–5.11)
RDW: 15.9 % — ABNORMAL HIGH (ref 11.5–15.5)
WBC: 4.5 10*3/uL (ref 4.0–10.5)
nRBC: 0 % (ref 0.0–0.2)

## 2023-04-02 MED ORDER — IOHEXOL 350 MG/ML SOLN
75.0000 mL | Freq: Once | INTRAVENOUS | Status: AC | PRN
  Administered 2023-04-02: 75 mL via INTRAVENOUS

## 2023-04-02 NOTE — ED Notes (Signed)
Pt transported to xray 

## 2023-04-02 NOTE — Discharge Instructions (Signed)
Follow up with your doctor in the office.  
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
## 2023-04-02 NOTE — ED Notes (Signed)
Family Cordelia Pen [phone] would like an update asap

## 2023-04-04 ENCOUNTER — Ambulatory Visit (HOSPITAL_BASED_OUTPATIENT_CLINIC_OR_DEPARTMENT_OTHER): Admission: RE | Admit: 2023-04-04 | Payer: Medicare HMO | Source: Ambulatory Visit

## 2023-04-16 ENCOUNTER — Inpatient Hospital Stay: Payer: Medicare HMO | Attending: Hematology & Oncology

## 2023-04-16 ENCOUNTER — Ambulatory Visit (HOSPITAL_BASED_OUTPATIENT_CLINIC_OR_DEPARTMENT_OTHER)
Admission: RE | Admit: 2023-04-16 | Discharge: 2023-04-16 | Disposition: A | Discharge status: Home / Self Care | Payer: Medicare HMO | Source: Ambulatory Visit | Attending: Family | Admitting: Family

## 2023-04-16 ENCOUNTER — Encounter: Payer: Self-pay | Admitting: Family

## 2023-04-16 ENCOUNTER — Inpatient Hospital Stay: Payer: Medicare HMO

## 2023-04-16 ENCOUNTER — Inpatient Hospital Stay (HOSPITAL_BASED_OUTPATIENT_CLINIC_OR_DEPARTMENT_OTHER): Payer: Medicare HMO | Admitting: Family

## 2023-04-16 VITALS — BP 114/50 | HR 72 | Temp 99.1°F | Resp 18 | Wt 175.0 lb

## 2023-04-16 DIAGNOSIS — Z9981 Dependence on supplemental oxygen: Secondary | ICD-10-CM | POA: Diagnosis not present

## 2023-04-16 DIAGNOSIS — R1011 Right upper quadrant pain: Secondary | ICD-10-CM | POA: Diagnosis present

## 2023-04-16 DIAGNOSIS — Z7902 Long term (current) use of antithrombotics/antiplatelets: Secondary | ICD-10-CM | POA: Diagnosis not present

## 2023-04-16 DIAGNOSIS — D500 Iron deficiency anemia secondary to blood loss (chronic): Secondary | ICD-10-CM | POA: Diagnosis not present

## 2023-04-16 DIAGNOSIS — D751 Secondary polycythemia: Secondary | ICD-10-CM

## 2023-04-16 DIAGNOSIS — R0602 Shortness of breath: Secondary | ICD-10-CM | POA: Insufficient documentation

## 2023-04-16 LAB — CMP (CANCER CENTER ONLY)
ALT: 13 U/L (ref 0–44)
AST: 18 U/L (ref 15–41)
Albumin: 4 g/dL (ref 3.5–5.0)
Alkaline Phosphatase: 54 U/L (ref 38–126)
Anion gap: 11 (ref 5–15)
BUN: 20 mg/dL (ref 8–23)
CO2: 29 mmol/L (ref 22–32)
Calcium: 9.1 mg/dL (ref 8.9–10.3)
Chloride: 104 mmol/L (ref 98–111)
Creatinine: 0.66 mg/dL (ref 0.44–1.00)
GFR, Estimated: >60 mL/min (ref >60)
Glucose, Bld: 83 mg/dL (ref 70–99)
Potassium: 4.4 mmol/L (ref 3.5–5.1)
Sodium: 144 mmol/L (ref 135–145)
Total Bilirubin: 0.4 mg/dL (ref 0.3–1.2)
Total Protein: 6.2 g/dL — ABNORMAL LOW (ref 6.5–8.1)

## 2023-04-16 LAB — IRON AND IRON BINDING CAPACITY (CC-WL,HP ONLY)
Iron: 30 ug/dL (ref 28–170)
Saturation Ratios: 6 % — ABNORMAL LOW (ref 10.4–31.8)
TIBC: 528 ug/dL — ABNORMAL HIGH (ref 250–450)
UIBC: 498 ug/dL — ABNORMAL HIGH (ref 148–442)

## 2023-04-16 LAB — CBC WITH DIFFERENTIAL (CANCER CENTER ONLY)
Abs Immature Granulocytes: 0.01 10*3/uL (ref 0.00–0.07)
Basophils Absolute: 0 10*3/uL (ref 0.0–0.1)
Basophils Relative: 1 %
Eosinophils Absolute: 0.1 10*3/uL (ref 0.0–0.5)
Eosinophils Relative: 2 %
HCT: 33.9 % — ABNORMAL LOW (ref 36.0–46.0)
Hemoglobin: 10.1 g/dL — ABNORMAL LOW (ref 12.0–15.0)
Immature Granulocytes: 0 %
Lymphocytes Relative: 14 %
Lymphs Abs: 0.9 10*3/uL (ref 0.7–4.0)
MCH: 22.2 pg — ABNORMAL LOW (ref 26.0–34.0)
MCHC: 29.8 g/dL — ABNORMAL LOW (ref 30.0–36.0)
MCV: 74.7 fL — ABNORMAL LOW (ref 80.0–100.0)
Monocytes Absolute: 0.4 10*3/uL (ref 0.1–1.0)
Monocytes Relative: 7 %
Neutro Abs: 4.9 10*3/uL (ref 1.7–7.7)
Neutrophils Relative %: 76 %
Platelet Count: 252 10*3/uL (ref 150–400)
RBC: 4.54 MIL/uL (ref 3.87–5.11)
RDW: 15.9 % — ABNORMAL HIGH (ref 11.5–15.5)
WBC Count: 6.4 10*3/uL (ref 4.0–10.5)
nRBC: 0 % (ref 0.0–0.2)

## 2023-04-16 LAB — FERRITIN: Ferritin: 4 ng/mL — ABNORMAL LOW (ref 11–307)
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
## 2023-05-12 ENCOUNTER — Other Ambulatory Visit: Payer: Self-pay | Admitting: *Deleted

## 2023-05-12 DIAGNOSIS — D45 Polycythemia vera: Secondary | ICD-10-CM

## 2023-05-12 DIAGNOSIS — Z8673 Personal history of transient ischemic attack (TIA), and cerebral infarction without residual deficits: Secondary | ICD-10-CM

## 2023-05-12 MED ORDER — CLOPIDOGREL BISULFATE 75 MG PO TABS
75.0000 mg | ORAL_TABLET | Freq: Every day | ORAL | 1 refills | Status: DC
Start: 2023-05-12 — End: 2023-08-08

## 2023-05-28 ENCOUNTER — Inpatient Hospital Stay: Payer: Medicare HMO

## 2023-05-28 ENCOUNTER — Inpatient Hospital Stay (HOSPITAL_BASED_OUTPATIENT_CLINIC_OR_DEPARTMENT_OTHER): Payer: Medicare HMO | Admitting: Family

## 2023-05-28 ENCOUNTER — Inpatient Hospital Stay: Payer: Medicare HMO | Attending: Hematology & Oncology

## 2023-05-28 ENCOUNTER — Encounter: Payer: Self-pay | Admitting: Family

## 2023-05-28 VITALS — BP 114/52 | HR 73 | Temp 98.5°F | Resp 17 | Wt 172.0 lb

## 2023-05-28 DIAGNOSIS — D751 Secondary polycythemia: Secondary | ICD-10-CM | POA: Diagnosis present

## 2023-05-28 DIAGNOSIS — Z7902 Long term (current) use of antithrombotics/antiplatelets: Secondary | ICD-10-CM | POA: Insufficient documentation

## 2023-05-28 DIAGNOSIS — D500 Iron deficiency anemia secondary to blood loss (chronic): Secondary | ICD-10-CM | POA: Diagnosis not present

## 2023-05-28 DIAGNOSIS — R0602 Shortness of breath: Secondary | ICD-10-CM | POA: Diagnosis not present

## 2023-05-28 DIAGNOSIS — J449 Chronic obstructive pulmonary disease, unspecified: Secondary | ICD-10-CM | POA: Insufficient documentation

## 2023-05-28 LAB — CBC WITH DIFFERENTIAL (CANCER CENTER ONLY)
Abs Immature Granulocytes: 0.01 10*3/uL (ref 0.00–0.07)
Basophils Absolute: 0 10*3/uL (ref 0.0–0.1)
Basophils Relative: 1 %
Eosinophils Absolute: 0.2 10*3/uL (ref 0.0–0.5)
Eosinophils Relative: 4 %
HCT: 37 % (ref 36.0–46.0)
Hemoglobin: 11.1 g/dL — ABNORMAL LOW (ref 12.0–15.0)
Immature Granulocytes: 0 %
Lymphocytes Relative: 24 %
Lymphs Abs: 1.2 10*3/uL (ref 0.7–4.0)
MCH: 22.5 pg — ABNORMAL LOW (ref 26.0–34.0)
MCHC: 30 g/dL (ref 30.0–36.0)
MCV: 75.1 fL — ABNORMAL LOW (ref 80.0–100.0)
Monocytes Absolute: 0.5 10*3/uL (ref 0.1–1.0)
Monocytes Relative: 10 %
Neutro Abs: 3 10*3/uL (ref 1.7–7.7)
Neutrophils Relative %: 61 %
Platelet Count: 246 10*3/uL (ref 150–400)
RBC: 4.93 MIL/uL (ref 3.87–5.11)
RDW: 17 % — ABNORMAL HIGH (ref 11.5–15.5)
WBC Count: 4.8 10*3/uL (ref 4.0–10.5)
nRBC: 0 % (ref 0.0–0.2)

## 2023-05-28 LAB — CMP (CANCER CENTER ONLY)
ALT: 14 U/L (ref 0–44)
AST: 19 U/L (ref 15–41)
Albumin: 3.9 g/dL (ref 3.5–5.0)
Alkaline Phosphatase: 60 U/L (ref 38–126)
Anion gap: 5 (ref 5–15)
BUN: 15 mg/dL (ref 8–23)
CO2: 31 mmol/L (ref 22–32)
Calcium: 9.2 mg/dL (ref 8.9–10.3)
Chloride: 104 mmol/L (ref 98–111)
Creatinine: 0.71 mg/dL (ref 0.44–1.00)
GFR, Estimated: >60 mL/min (ref >60)
Glucose, Bld: 109 mg/dL — ABNORMAL HIGH (ref 70–99)
Potassium: 3.9 mmol/L (ref 3.5–5.1)
Sodium: 140 mmol/L (ref 135–145)
Total Bilirubin: 0.5 mg/dL (ref 0.3–1.2)
Total Protein: 5.9 g/dL — ABNORMAL LOW (ref 6.5–8.1)

## 2023-05-28 LAB — IRON AND IRON BINDING CAPACITY (CC-WL,HP ONLY)
Iron: 38 ug/dL (ref 28–170)
Saturation Ratios: 8 % — ABNORMAL LOW (ref 10.4–31.8)
TIBC: 507 ug/dL — ABNORMAL HIGH (ref 250–450)
UIBC: 469 ug/dL — ABNORMAL HIGH (ref 148–442)

## 2023-05-28 LAB — FERRITIN: Ferritin: 3 ng/mL — ABNORMAL LOW (ref 11–307)

## 2023-05-28 NOTE — Progress Notes (Signed)
Hematology and Oncology Follow Up Visit  Natalie Hendrix [id number] [birth year] 72 y.o. 05/28/2023   Principle Diagnosis:  Secondary polycythemia - JAK2 negative   Current Therapy:        Phlebotomy to maintain hematocrit less than 38% Plavix 75 mg by mouth daily   Interim History:  Natalie Hendrix is here today for follow-up. Hct is stable at 37%. Her biggest issue at this time seems to be poor balance. She states that she has had 4 falls due to a lip on their stairs at home that her foot gets caught on. Thankfully she had a handy man come by yesterday to saw this off of every stair. Hopefully this will keep her from having more events. She does not feel that she needs to go to the ED to get checked out at this time.  She is ambulating with her cane for added support. 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  She has fatigue and palpitations at times.  She is staying quite busy caring for her husband who is bed bound right now.  SOB stable with supplemental O2.  No fever, chills, n/v, cough, rash, chest pain, abdominal pain or changes in bowel or bladder habits.  No swelling, numbness or tingling in her extremities at this time.  Appetite and hydration are good. Weight is 172 lbs.   ECOG Performance Status: 1 - Symptomatic but completely ambulatory  Medications:  Allergies as of 05/28/2023       Reactions   Epinephrine Palpitations   Raises heart rate   Flonase [fluticasone] Palpitations   Protriptyline Hcl Other (See Comments)   Severe constipation   Tamiflu [oseltamivir Phosphate] Other (See Comments)   "like I was having a stroke"   Tamiflu [oseltamivir] Anaphylaxis   Tizanidine Other (See Comments)   panic   Gabapentin Other (See Comments)   arthralgia Other reaction(s): Arthralgia (Joint Pain)   Other Hives, Rash   boiron acteane   Qvar [beclomethasone] Other (See Comments)   Spiriva [tiotropium Bromide Monohydrate] Rash   Tiotropium Rash   Zonegran Other (See Comments)   Mood  swings   Advair Diskus [fluticasone-salmeterol] Rash   Baclofen Rash   Chlorzoxazone Rash   Lamotrigine Rash   Levofloxacin Rash   Protriptyline Rash   Ropinirole Rash   Tiotropium Bromide Monohydrate Rash   Verapamil Rash        Medication List        Accurate as of May 28, 2023 10:53 AM. If you have any questions, ask your nurse or doctor.          albuterol 108 (90 Base) MCG/ACT inhaler Commonly known as: ProAir HFA Inhale 2 puffs into the lungs every 6 (six) hours as needed for wheezing or shortness of breath.   albuterol (2.5 MG/3ML) 0.083% nebulizer solution Commonly known as: PROVENTIL Take 3 mLs (2.5 mg total) by nebulization every 6 (six) hours as needed for wheezing or shortness of breath.   azelastine 0.1 % nasal spray Commonly known as: ASTELIN Place 1 spray into both nostrils 2 (two) times daily.   beclomethasone 42 MCG/SPRAY nasal spray Commonly known as: BECONASE-AQ 2 puffs each nostril once daily   benzonatate 100 MG capsule Commonly known as: TESSALON Take 1 capsule (100 mg total) by mouth every 6 (six) hours as needed for cough.   buPROPion 300 MG 24 hr tablet Commonly known as: WELLBUTRIN XL Take 1 tablet (300 mg total) by mouth daily with breakfast.   CALCIUM 1200 PO  Take 1,250 mg by mouth daily.   Centrum Silver tablet Take 1 tablet by mouth daily.   clopidogrel 75 MG tablet Commonly known as: PLAVIX Take 1 tablet (75 mg total) by mouth daily.   cyclobenzaprine 10 MG tablet Commonly known as: FLEXERIL Take 10 mg by mouth every 8 (eight) hours as needed.   ESTRACE VAGINAL 0.1 MG/GM vaginal cream Generic drug: estradiol Place 1 Applicatorful vaginally daily. Small amount each dose per pt   ezetimibe 10 MG tablet Commonly known as: Zetia TAKE 1 TABLET BY MOUTH DAILY.   fexofenadine 180 MG tablet Commonly known as: Allegra Allergy Take 1 tablet (180 mg total) by mouth daily.   Fish Oil 1200 MG Caps Take 1,200 mg by mouth at  bedtime.   hydrOXYzine 10 MG tablet Commonly known as: ATARAX Take 10 mg by mouth 3 (three) times daily as needed.   hyoscyamine 0.125 MG Tbdp disintergrating tablet Commonly known as: ANASPAZ Place 1 tablet (0.125 mg total) under the tongue 2 (two) times daily as needed. What changed: reasons to take this   lidocaine 5 % Commonly known as: LIDODERM Place 1 patch onto the skin as needed (pain). Remove & Discard patch within 12 hours or as directed by MD   LORazepam 1 MG tablet Commonly known as: ATIVAN Take 1 tablet (1 mg total) by mouth 2 (two) times daily. May take extra 3rd dose if needed What changed:  when to take this reasons to take this   magnesium gluconate 500 MG tablet Commonly known as: MAGONATE Take 500 mg by mouth daily as needed (constipation).   meclizine 25 MG tablet Commonly known as: ANTIVERT Take 1 tablet (25 mg total) by mouth 3 (three) times daily as needed for dizziness. 3 month supply What changed: when to take this   metoprolol succinate 50 MG 24 hr tablet Commonly known as: TOPROL-XL Take 1 tablet (50 mg total) by mouth daily with breakfast. What changed: how much to take   morphine 15 MG tablet Commonly known as: MSIR Take 15 mg by mouth every 4 (four) hours as needed for severe pain.   morphine 30 MG 12 hr tablet Commonly known as: MS CONTIN Take 1 tablet by mouth every 12 (twelve) hours.   Nebulizer Devi Use as directed   OXYGEN Inhale 2.5 L/hr into the lungs continuous.   pantoprazole 40 MG tablet Commonly known as: PROTONIX TAKE 1 TABLET (40 MG TOTAL) BY MOUTH DAILY.   Polyethyl Glycol-Propyl Glycol 0.4-0.3 % Soln Apply 1 drop to eye daily as needed (dry eyes).   pravastatin 40 MG tablet Commonly known as: PRAVACHOL Take 1 tablet (40 mg total) by mouth at bedtime.   promethazine 25 MG tablet Commonly known as: PHENERGAN Take 25 mg by mouth every 8 (eight) hours as needed for nausea or vomiting.   Zovirax 5 % Generic  drug: acyclovir ointment Apply 1 application topically as needed (flair).        Allergies:  Allergies  Allergen Reactions   Epinephrine Palpitations    Raises heart rate    Flonase [Fluticasone] Palpitations   Protriptyline Hcl Other (See Comments)    Severe constipation   Tamiflu [Oseltamivir Phosphate] Other (See Comments)    "like I was having a stroke"   Tamiflu [Oseltamivir] Anaphylaxis   Tizanidine Other (See Comments)    panic   Gabapentin Other (See Comments)    arthralgia Other reaction(s): Arthralgia (Joint Pain)   Other Hives and Rash    boiron acteane  Qvar [Beclomethasone] Other (See Comments)   Spiriva [Tiotropium Bromide Monohydrate] Rash   Tiotropium Rash   Zonegran Other (See Comments)    Mood swings   Advair Diskus [Fluticasone-Salmeterol] Rash   Baclofen Rash   Chlorzoxazone Rash   Lamotrigine Rash   Levofloxacin Rash   Protriptyline Rash   Ropinirole Rash   Tiotropium Bromide Monohydrate Rash   Verapamil Rash    Past Medical History, Surgical history, Social history, and Family History were reviewed and updated.  Review of Systems: All other 10 point review of systems is negative.   Physical Exam:  weight is 172 lb 0.6 oz (78 kg). Her oral temperature is 98.5 F (36.9 C). Her blood pressure is 114/52 (abnormal) and her pulse is 73. Her respiration is 17 and oxygen saturation is 98%.   Wt Readings from Last 3 Encounters:  05/28/23 172 lb 0.6 oz (78 kg)  04/16/23 175 lb 0.6 oz (79.4 kg)  04/02/23 171 lb 15.3 oz (78 kg)    Ocular: Sclerae unicteric, pupils equal, round and reactive to light Ear-nose-throat: Oropharynx clear, dentition fair Lymphatic: No cervical or supraclavicular adenopathy Lungs no rales or rhonchi, good excursion bilaterally Heart regular rate and rhythm, no murmur appreciated Abd soft, nontender, positive bowel sounds MSK no focal spinal tenderness, no joint edema Neuro: non-focal, well-oriented, appropriate  affect Breasts: Deferred   Lab Results  Component Value Date   WBC 4.8 05/28/2023   HGB 11.1 (L) 05/28/2023   HCT 37.0 05/28/2023   MCV 75.1 (L) 05/28/2023   PLT 246 05/28/2023   Lab Results  Component Value Date   FERRITIN 4 (L) 04/16/2023   IRON 30 04/16/2023   TIBC 528 (H) 04/16/2023   UIBC 498 (H) 04/16/2023   IRONPCTSAT 6 (L) 04/16/2023   Lab Results  Component Value Date   RETICCTPCT 1.1 04/27/2015   RBC 4.93 05/28/2023   RETICCTABS 55.1 04/27/2015   No results found for: "KPAFRELGTCHN", "LAMBDASER", "KAPLAMBRATIO" No results found for: "IGGSERUM", "IGA", "IGMSERUM" No results found for: "TOTALPROTELP", "ALBUMINELP", "A1GS", "A2GS", "BETS", "BETA2SER", "GAMS", "MSPIKE", "SPEI"   Chemistry      Component Value Date/Time   NA 140 05/28/2023 0948   NA 147 (H) 10/08/2017 1410   NA 141 02/17/2017 1132   K 3.9 05/28/2023 0948   K 4.6 10/08/2017 1410   K 5.3 (H) 02/17/2017 1132   CL 104 05/28/2023 0948   CL 106 10/08/2017 1410   CO2 31 05/28/2023 0948   CO2 30 10/08/2017 1410   CO2 29 02/17/2017 1132   BUN 15 05/28/2023 0948   BUN 15 10/08/2017 1410   BUN 17.8 02/17/2017 1132   CREATININE 0.71 05/28/2023 0948   CREATININE 1.0 10/08/2017 1410   CREATININE 0.8 02/17/2017 1132      Component Value Date/Time   CALCIUM 9.2 05/28/2023 0948   CALCIUM 9.0 10/08/2017 1410   CALCIUM 9.3 02/17/2017 1132   ALKPHOS 60 05/28/2023 0948   ALKPHOS 59 10/08/2017 1410   ALKPHOS 79 02/17/2017 1132   AST 19 05/28/2023 0948   AST 22 02/17/2017 1132   ALT 14 05/28/2023 0948   ALT 26 10/08/2017 1410   ALT 20 02/17/2017 1132   BILITOT 0.5 05/28/2023 0948   BILITOT 0.39 02/17/2017 1132       Impression and Plan: Ms. Fodor is a very pleasant 72 yo caucasian female with secondary polycythemia (JAK-2 negative) due to COPD. No phlebotomy needed, Hct 37%  Iron studied pending.  Follow-up in 6 weeks.  Eileen Stanford, NP 6/26/202410:53 AM

## 2023-07-09 ENCOUNTER — Inpatient Hospital Stay: Payer: Medicare HMO

## 2023-07-09 ENCOUNTER — Inpatient Hospital Stay: Payer: Medicare HMO | Admitting: Family

## 2023-07-12 ENCOUNTER — Telehealth: Payer: Self-pay | Admitting: Critical Care Medicine

## 2023-07-12 DIAGNOSIS — J40 Bronchitis, not specified as acute or chronic: Secondary | ICD-10-CM

## 2023-07-12 MED ORDER — BENZONATATE 100 MG PO CAPS: 100.0000 mg | ORAL_CAPSULE | Freq: Four times a day (QID) | ORAL | 0 refills | Status: DC | PRN

## 2023-07-12 MED ORDER — DOXYCYCLINE HYCLATE 100 MG PO TABS: 100.0000 mg | ORAL_TABLET | Freq: Two times a day (BID) | ORAL | 0 refills | Status: DC

## 2023-07-12 NOTE — Telephone Encounter (Signed)
Natalie Hendrix called the answering service to indicated she wants antibiotics for bronchitis that has developed from allergies. She was last seen by [facility] in 08/2021. I attempted to call back at the number provided (same as what is in the chart), [phone]- no answer, left a message to please call the answering service back if she still needs to speak to me.   Steffanie Dunn, DO 07/12/23 4:18 PM [facility]

## 2023-07-12 NOTE — Telephone Encounter (Signed)
Ms. Trettel called the answering service back again- returned her call x 2-- both went straight to VM and I left 2 messages. I called her husband's number-- went to VM, left a message. I was able to speak with Adella Nissen, who is another relative listed in her chart- she is not with [facility]inal[facility] currently. I asked her to please try to call Ms. Goos to encourage her to answer my phone call when I call her back in a few minutes. She indicated that Ms. Ledoux husband is currently hospitalized, ands he knows that Ms. Beasley has not been feeling well. I related to her my concern that Ms. Mallik may need to be evaluated by a physician in person this weekend rather than over the phone.   Called Mrs. Bringman back again twice and left another message.  I indicated my concern that we have not been able to get in touch with each other.  If she is not feeling well it seems most reasonable that she should be seen in person by physician at urgent care or the emergency department since I am unable to evaluate her symptoms at present.  I encouraged her to call back to the answering service if she has additional questions.  Steffanie Dunn, DO 07/12/23 4:55 PM [facility]    Colored sputum, SOB, wearing her oxygen today-- now 98%. Does not think she needs to be seen by urgent care. Allergic to quinolones. Will prescribed Doxy. She also requested cough meds-- tessalon prescribed.   I let her know that since she has not been seen in the office in 2 years we cannot continue prescribing medications for her without her being an active patient. She said she will call to make an appointment. I requested that until she is seen again to please stay in touch with her PCP and request meds from them since we are limited by not being able to assess her in a visit. ER precautions given and she agreed. She confirmed her pharmacy is still open past 5 on Saturdays.   Steffanie Dunn, DO 07/12/23 5:05 PM  Pulmonary &  Critical Care

## 2023-07-15 ENCOUNTER — Inpatient Hospital Stay: Payer: Medicare HMO | Admitting: Family

## 2023-07-15 ENCOUNTER — Inpatient Hospital Stay: Payer: Medicare HMO | Attending: Hematology & Oncology

## 2023-07-15 ENCOUNTER — Inpatient Hospital Stay: Payer: Medicare HMO

## 2023-07-15 DIAGNOSIS — Z5189 Encounter for other specified aftercare: Secondary | ICD-10-CM | POA: Insufficient documentation

## 2023-07-15 DIAGNOSIS — Z8639 Personal history of other endocrine, nutritional and metabolic disease: Secondary | ICD-10-CM | POA: Insufficient documentation

## 2023-07-15 DIAGNOSIS — D751 Secondary polycythemia: Secondary | ICD-10-CM | POA: Insufficient documentation
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
## 2023-07-28 ENCOUNTER — Encounter: Payer: Self-pay | Admitting: Internal Medicine

## 2023-07-28 ENCOUNTER — Ambulatory Visit: Payer: Medicare HMO | Admitting: Internal Medicine

## 2023-07-28 VITALS — BP 122/68 | HR 86 | Ht 67.0 in | Wt 171.2 lb

## 2023-07-28 DIAGNOSIS — J9611 Chronic respiratory failure with hypoxia: Secondary | ICD-10-CM | POA: Diagnosis not present

## 2023-07-28 MED ORDER — BENZONATATE 100 MG PO CAPS: 100.0000 mg | ORAL_CAPSULE | Freq: Four times a day (QID) | ORAL | 5 refills | Status: AC | PRN

## 2023-07-28 MED ORDER — ALBUTEROL SULFATE HFA 108 (90 BASE) MCG/ACT IN AERS: 2.0000 | INHALATION_SPRAY | Freq: Four times a day (QID) | RESPIRATORY_TRACT | 3 refills | Status: DC | PRN

## 2023-07-28 MED ORDER — FEXOFENADINE HCL 180 MG PO TABS: 180.0000 mg | ORAL_TABLET | Freq: Every day | ORAL | 1 refills | Status: DC

## 2023-07-28 MED ORDER — AZELASTINE HCL 0.1 % NA SOLN: 1.0000 | Freq: Two times a day (BID) | NASAL | 3 refills | Status: DC

## 2023-07-28 MED ORDER — ALBUTEROL SULFATE (2.5 MG/3ML) 0.083% IN NEBU: 2.5000 mg | INHALATION_SOLUTION | Freq: Four times a day (QID) | RESPIRATORY_TRACT | 3 refills | Status: DC | PRN

## 2023-07-28 NOTE — Patient Instructions (Addendum)
Meds refilled- [facility]

## 2023-07-29 ENCOUNTER — Encounter: Payer: Self-pay | Admitting: Internal Medicine

## 2023-07-29 NOTE — Assessment & Plan Note (Signed)
Continues to benefit from O2, used especially during sleep. Brings it with her today. Plan- continue O2 2-3L

## 2023-08-01 ENCOUNTER — Other Ambulatory Visit: Payer: Self-pay

## 2023-08-01 ENCOUNTER — Inpatient Hospital Stay (HOSPITAL_BASED_OUTPATIENT_CLINIC_OR_DEPARTMENT_OTHER): Payer: Medicare HMO | Admitting: Family

## 2023-08-01 ENCOUNTER — Inpatient Hospital Stay: Payer: Medicare HMO

## 2023-08-01 ENCOUNTER — Encounter: Payer: Self-pay | Admitting: Family

## 2023-08-01 VITALS — BP 117/63 | HR 79 | Temp 99.1°F | Resp 17 | Ht 67.0 in | Wt 167.4 lb

## 2023-08-01 VITALS — BP 108/49

## 2023-08-01 DIAGNOSIS — D751 Secondary polycythemia: Secondary | ICD-10-CM

## 2023-08-01 DIAGNOSIS — D45 Polycythemia vera: Secondary | ICD-10-CM

## 2023-08-01 DIAGNOSIS — Z8639 Personal history of other endocrine, nutritional and metabolic disease: Secondary | ICD-10-CM | POA: Diagnosis not present

## 2023-08-01 DIAGNOSIS — Z5189 Encounter for other specified aftercare: Secondary | ICD-10-CM | POA: Diagnosis not present

## 2023-08-01 DIAGNOSIS — D500 Iron deficiency anemia secondary to blood loss (chronic): Secondary | ICD-10-CM

## 2023-08-01 LAB — CBC WITH DIFFERENTIAL (CANCER CENTER ONLY)
Abs Immature Granulocytes: 0.02 10*3/uL (ref 0.00–0.07)
Basophils Absolute: 0.1 10*3/uL (ref 0.0–0.1)
Basophils Relative: 1 %
Eosinophils Absolute: 0.1 10*3/uL (ref 0.0–0.5)
Eosinophils Relative: 1 %
HCT: 39.3 % (ref 36.0–46.0)
Hemoglobin: 11.9 g/dL — ABNORMAL LOW (ref 12.0–15.0)
Immature Granulocytes: 0 %
Lymphocytes Relative: 12 %
Lymphs Abs: 0.8 10*3/uL (ref 0.7–4.0)
MCH: 23.3 pg — ABNORMAL LOW (ref 26.0–34.0)
MCHC: 30.3 g/dL (ref 30.0–36.0)
MCV: 76.9 fL — ABNORMAL LOW (ref 80.0–100.0)
Monocytes Absolute: 0.3 10*3/uL (ref 0.1–1.0)
Monocytes Relative: 5 %
Neutro Abs: 5.8 10*3/uL (ref 1.7–7.7)
Neutrophils Relative %: 81 %
Platelet Count: 302 10*3/uL (ref 150–400)
RBC: 5.11 MIL/uL (ref 3.87–5.11)
RDW: 19.2 % — ABNORMAL HIGH (ref 11.5–15.5)
WBC Count: 7 10*3/uL (ref 4.0–10.5)
nRBC: 0 % (ref 0.0–0.2)

## 2023-08-01 LAB — FERRITIN: Ferritin: 5 ng/mL — ABNORMAL LOW (ref 11–307)

## 2023-08-01 LAB — IRON AND IRON BINDING CAPACITY (CC-WL,HP ONLY)
Iron: 43 ug/dL (ref 28–170)
Saturation Ratios: 8 % — ABNORMAL LOW (ref 10.4–31.8)
TIBC: 517 ug/dL — ABNORMAL HIGH (ref 250–450)
UIBC: 474 ug/dL

## 2023-08-01 LAB — CMP (CANCER CENTER ONLY)
ALT: 24 U/L (ref 0–44)
AST: 23 U/L (ref 15–41)
Albumin: 3.9 g/dL (ref 3.5–5.0)
Alkaline Phosphatase: 52 U/L (ref 38–126)
Anion gap: 6 (ref 5–15)
BUN: 19 mg/dL (ref 8–23)
CO2: 26 mmol/L (ref 22–32)
Calcium: 9.2 mg/dL (ref 8.9–10.3)
Chloride: 108 mmol/L (ref 98–111)
Creatinine: 0.64 mg/dL (ref 0.44–1.00)
GFR, Estimated: >60 mL/min (ref >60)
Glucose, Bld: 125 mg/dL — ABNORMAL HIGH (ref 70–99)
Potassium: 4.5 mmol/L (ref 3.5–5.1)
Sodium: 140 mmol/L (ref 135–145)
Total Bilirubin: 0.5 mg/dL (ref 0.3–1.2)
Total Protein: 6.4 g/dL — ABNORMAL LOW (ref 6.5–8.1)

## 2023-08-01 MED ORDER — SODIUM CHLORIDE 0.9 % IV SOLN: Freq: Once | INTRAVENOUS | Status: AC

## 2023-08-01 NOTE — Progress Notes (Signed)
Natalie Hendrix presents today for phlebotomy per MD orders. Phlebotomy procedure started at 1320 and ended at 1348. 500 grams removed. Patient observed for 30 minutes after procedure without any incident. Patient tolerated procedure well. IV needle removed intact.

## 2023-08-01 NOTE — Patient Instructions (Signed)

## 2023-08-01 NOTE — Progress Notes (Signed)
Hematology and Oncology Follow Up Visit  Natalie Hendrix [id number] October 23, 1951 72 y.o. 08/01/2023   Principle Diagnosis:  Secondary polycythemia - JAK2 negative   Current Therapy:        Phlebotomy to maintain hematocrit less than 38% Plavix 75 mg by mouth daily   Interim History:  Natalie Hendrix is here today for follow-up and phlebotomy. Hct is 39.3 today. She is symptomatic with fatigued, lightheadedness and itching without a rash.  No blood loss noted. No bruising or petechiae.  She is bringing her husband home from rehab tomorrow. This has been a stressful few months for her due to his poor health.  No fever, chills, n/v, cough, rash, chest pain, abdominal pain or changes in bowel or bladder habits.  No swelling in her extremities.  Neuropathy unchanged from baseline.  She occasionally will have a fall due to lightheadedness. Thankfully she has not been seriously injured.  Appetite and hydration are good. Weight is 167 lbs.   ECOG Performance Status: 1 - Symptomatic but completely ambulatory  Medications:  Allergies as of 08/01/2023       Reactions   Epinephrine Palpitations   Raises heart rate   Flonase [fluticasone] Palpitations   Protriptyline Hcl Other (See Comments)   Severe constipation   Tamiflu [oseltamivir Phosphate] Other (See Comments)   "like I was having a stroke"   Tamiflu [oseltamivir] Anaphylaxis   Tizanidine Other (See Comments)   panic   Gabapentin Other (See Comments)   arthralgia Other reaction(s): Arthralgia (Joint Pain)   Other Hives, Rash   boiron acteane   Qvar [beclomethasone] Other (See Comments)   Spiriva [tiotropium Bromide Monohydrate] Rash   Tiotropium Rash   Zonegran Other (See Comments)   Mood swings   Advair Diskus [fluticasone-salmeterol] Rash   Baclofen Rash   Chlorzoxazone Rash   Lamotrigine Rash   Levofloxacin Rash   Protriptyline Rash   Ropinirole Rash   Tiotropium Bromide Monohydrate Rash   Verapamil Rash         Medication List        Accurate as of August 01, 2023  1:34 PM. If you have any questions, ask your nurse or doctor.          albuterol (2.5 MG/3ML) 0.083% nebulizer solution Commonly known as: PROVENTIL Take 3 mLs (2.5 mg total) by nebulization every 6 (six) hours as needed for wheezing or shortness of breath.   albuterol 108 (90 Base) MCG/ACT inhaler Commonly known as: ProAir HFA Inhale 2 puffs into the lungs every 6 (six) hours as needed for wheezing or shortness of breath.   azelastine 0.1 % nasal spray Commonly known as: ASTELIN Place 1 spray into both nostrils 2 (two) times daily.   beclomethasone 42 MCG/SPRAY nasal spray Commonly known as: BECONASE-AQ 2 puffs each nostril once daily   benzonatate 100 MG capsule Commonly known as: Tessalon Perles Take 1 capsule (100 mg total) by mouth every 6 (six) hours as needed for cough.   buPROPion 300 MG 24 hr tablet Commonly known as: WELLBUTRIN XL Take 1 tablet (300 mg total) by mouth daily with breakfast.   CALCIUM 1200 PO Take 1,250 mg by mouth daily.   Centrum Silver tablet Take 1 tablet by mouth daily.   clopidogrel 75 MG tablet Commonly known as: PLAVIX Take 1 tablet (75 mg total) by mouth daily.   cyclobenzaprine 10 MG tablet Commonly known as: FLEXERIL Take 10 mg by mouth every 8 (eight) hours as needed.   doxycycline 100 MG  tablet Commonly known as: VIBRA-TABS Take 1 tablet (100 mg total) by mouth 2 (two) times daily.   ESTRACE VAGINAL 0.1 MG/GM vaginal cream Generic drug: estradiol Place 1 Applicatorful vaginally daily. Small amount each dose per pt   ezetimibe 10 MG tablet Commonly known as: Zetia TAKE 1 TABLET BY MOUTH DAILY.   fexofenadine 180 MG tablet Commonly known as: Allegra Allergy Take 1 tablet (180 mg total) by mouth daily.   Fish Oil 1200 MG Caps Take 1,200 mg by mouth at bedtime.   hydrOXYzine 10 MG tablet Commonly known as: ATARAX Take 10 mg by mouth 3 (three) times daily as  needed.   hyoscyamine 0.125 MG Tbdp disintergrating tablet Commonly known as: ANASPAZ Place 1 tablet (0.125 mg total) under the tongue 2 (two) times daily as needed. What changed: reasons to take this   lidocaine 5 % Commonly known as: LIDODERM Place 1 patch onto the skin as needed (pain). Remove & Discard patch within 12 hours or as directed by MD   LORazepam 1 MG tablet Commonly known as: ATIVAN Take 1 tablet (1 mg total) by mouth 2 (two) times daily. May take extra 3rd dose if needed What changed:  when to take this reasons to take this   magnesium gluconate 500 MG tablet Commonly known as: MAGONATE Take 500 mg by mouth daily as needed (constipation).   meclizine 25 MG tablet Commonly known as: ANTIVERT Take 1 tablet (25 mg total) by mouth 3 (three) times daily as needed for dizziness. 3 month supply What changed: when to take this   metoprolol succinate 50 MG 24 hr tablet Commonly known as: TOPROL-XL Take 1 tablet (50 mg total) by mouth daily with breakfast. What changed: how much to take   morphine 15 MG tablet Commonly known as: MSIR Take 15 mg by mouth every 4 (four) hours as needed for severe pain.   morphine 30 MG 12 hr tablet Commonly known as: MS CONTIN Take 1 tablet by mouth every 12 (twelve) hours.   Nebulizer Devi Use as directed   OXYGEN Inhale 2.5 L/hr into the lungs continuous.   pantoprazole 40 MG tablet Commonly known as: PROTONIX TAKE 1 TABLET (40 MG TOTAL) BY MOUTH DAILY.   Polyethyl Glycol-Propyl Glycol 0.4-0.3 % Soln Apply 1 drop to eye daily as needed (dry eyes).   pravastatin 40 MG tablet Commonly known as: PRAVACHOL Take 1 tablet (40 mg total) by mouth at bedtime.   promethazine 25 MG tablet Commonly known as: PHENERGAN Take 25 mg by mouth every 8 (eight) hours as needed for nausea or vomiting.   Zovirax 5 % Generic drug: acyclovir ointment Apply 1 application topically as needed (flair).        Allergies:  Allergies   Allergen Reactions   Epinephrine Palpitations    Raises heart rate    Flonase [Fluticasone] Palpitations   Protriptyline Hcl Other (See Comments)    Severe constipation   Tamiflu [Oseltamivir Phosphate] Other (See Comments)    "like I was having a stroke"   Tamiflu [Oseltamivir] Anaphylaxis   Tizanidine Other (See Comments)    panic   Gabapentin Other (See Comments)    arthralgia Other reaction(s): Arthralgia (Joint Pain)   Other Hives and Rash    boiron acteane   Qvar [Beclomethasone] Other (See Comments)   Spiriva [Tiotropium Bromide Monohydrate] Rash   Tiotropium Rash   Zonegran Other (See Comments)    Mood swings   Advair Diskus [Fluticasone-Salmeterol] Rash   Baclofen Rash  Chlorzoxazone Rash   Lamotrigine Rash   Levofloxacin Rash   Protriptyline Rash   Ropinirole Rash   Tiotropium Bromide Monohydrate Rash   Verapamil Rash    Past Medical History, Surgical history, Social history, and Family History were reviewed and updated.  Review of Systems: All other 10 point review of systems is negative.   Physical Exam:  height is 5\' 7"  (1.702 m) and weight is 167 lb 6.4 oz (75.9 kg). Her oral temperature is 99.1 F (37.3 C). Her blood pressure is 117/63 and her pulse is 79. Her respiration is 17 and oxygen saturation is 97%.   Wt Readings from Last 3 Encounters:  08/01/23 167 lb 6.4 oz (75.9 kg)  07/28/23 171 lb 3.2 oz (77.7 kg)  05/28/23 172 lb 0.6 oz (78 kg)    Ocular: Sclerae unicteric, pupils equal, round and reactive to light Ear-nose-throat: Oropharynx clear, dentition fair Lymphatic: No cervical or supraclavicular adenopathy Lungs no rales or rhonchi, good excursion bilaterally Heart regular rate and rhythm, no murmur appreciated Abd soft, nontender, positive bowel sounds MSK no focal spinal tenderness, no joint edema Neuro: non-focal, well-oriented, appropriate affect Breasts: Deferred   Lab Results  Component Value Date   WBC 7.0 08/01/2023   HGB  11.9 (L) 08/01/2023   HCT 39.3 08/01/2023   MCV 76.9 (L) 08/01/2023   PLT 302 08/01/2023   Lab Results  Component Value Date   FERRITIN 3 (L) 05/28/2023   IRON 38 05/28/2023   TIBC 507 (H) 05/28/2023   UIBC 469 (H) 05/28/2023   IRONPCTSAT 8 (L) 05/28/2023   Lab Results  Component Value Date   RETICCTPCT 1.1 04/27/2015   RBC 5.11 08/01/2023   RETICCTABS 55.1 04/27/2015   No results found for: "KPAFRELGTCHN", "LAMBDASER", "KAPLAMBRATIO" No results found for: "IGGSERUM", "IGA", "IGMSERUM" No results found for: "TOTALPROTELP", "ALBUMINELP", "A1GS", "A2GS", "BETS", "BETA2SER", "GAMS", "MSPIKE", "SPEI"   Chemistry      Component Value Date/Time   NA 140 08/01/2023 1238   NA 147 (H) 10/08/2017 1410   NA 141 02/17/2017 1132   K 4.5 08/01/2023 1238   K 4.6 10/08/2017 1410   K 5.3 (H) 02/17/2017 1132   CL 108 08/01/2023 1238   CL 106 10/08/2017 1410   CO2 26 08/01/2023 1238   CO2 30 10/08/2017 1410   CO2 29 02/17/2017 1132   BUN 19 08/01/2023 1238   BUN 15 10/08/2017 1410   BUN 17.8 02/17/2017 1132   CREATININE 0.64 08/01/2023 1238   CREATININE 1.0 10/08/2017 1410   CREATININE 0.8 02/17/2017 1132      Component Value Date/Time   CALCIUM 9.2 08/01/2023 1238   CALCIUM 9.0 10/08/2017 1410   CALCIUM 9.3 02/17/2017 1132   ALKPHOS 52 08/01/2023 1238   ALKPHOS 59 10/08/2017 1410   ALKPHOS 79 02/17/2017 1132   AST 23 08/01/2023 1238   AST 22 02/17/2017 1132   ALT 24 08/01/2023 1238   ALT 26 10/08/2017 1410   ALT 20 02/17/2017 1132   BILITOT 0.5 08/01/2023 1238   BILITOT 0.39 02/17/2017 1132       Impression and Plan: Ms. Soelberg is a very pleasant 72 yo caucasian female with secondary polycythemia (JAK-2 negative) due to COPD. We will proceed with phlebotomy today with replacement fluids.  Iron studied pending.  Follow-up in 6 weeks.   Eileen Stanford, NP 8/30/20241:34 PM

## 2023-08-08 ENCOUNTER — Other Ambulatory Visit: Payer: Self-pay | Admitting: *Deleted

## 2023-08-08 DIAGNOSIS — D45 Polycythemia vera: Secondary | ICD-10-CM

## 2023-08-08 DIAGNOSIS — Z8673 Personal history of transient ischemic attack (TIA), and cerebral infarction without residual deficits: Secondary | ICD-10-CM

## 2023-08-08 MED ORDER — CLOPIDOGREL BISULFATE 75 MG PO TABS: 75.0000 mg | ORAL_TABLET | Freq: Every day | ORAL | 1 refills | Status: DC

## 2023-08-18 ENCOUNTER — Other Ambulatory Visit: Payer: Self-pay | Admitting: Critical Care Medicine

## 2023-08-31 LAB — EXTERNAL GENERIC LAB PROCEDURE: COLOGUARD: POSITIVE — AB

## 2023-08-31 LAB — COLOGUARD: COLOGUARD: POSITIVE — AB

## 2023-09-12 ENCOUNTER — Inpatient Hospital Stay: Payer: Medicare HMO

## 2023-09-12 ENCOUNTER — Ambulatory Visit: Payer: Medicare HMO

## 2023-09-12 ENCOUNTER — Ambulatory Visit: Payer: Medicare HMO | Admitting: Family

## 2023-09-16 ENCOUNTER — Inpatient Hospital Stay: Payer: Medicare HMO

## 2023-09-16 ENCOUNTER — Inpatient Hospital Stay: Payer: Medicare HMO | Admitting: Medical Oncology

## 2023-09-18 ENCOUNTER — Inpatient Hospital Stay: Payer: Medicare HMO

## 2023-09-18 ENCOUNTER — Other Ambulatory Visit: Payer: Medicare HMO | Attending: Hematology & Oncology

## 2023-09-18 ENCOUNTER — Encounter: Payer: Self-pay | Admitting: Medical Oncology

## 2023-09-18 ENCOUNTER — Telehealth: Payer: Self-pay | Admitting: *Deleted

## 2023-09-18 ENCOUNTER — Inpatient Hospital Stay (HOSPITAL_BASED_OUTPATIENT_CLINIC_OR_DEPARTMENT_OTHER): Payer: Medicare HMO | Admitting: Medical Oncology

## 2023-09-18 VITALS — BP 114/58 | HR 79 | Temp 98.5°F | Resp 18 | Wt 166.0 lb

## 2023-09-18 DIAGNOSIS — R195 Other fecal abnormalities: Secondary | ICD-10-CM

## 2023-09-18 DIAGNOSIS — D539 Nutritional anemia, unspecified: Secondary | ICD-10-CM | POA: Diagnosis not present

## 2023-09-18 DIAGNOSIS — D751 Secondary polycythemia: Secondary | ICD-10-CM | POA: Insufficient documentation

## 2023-09-18 DIAGNOSIS — D500 Iron deficiency anemia secondary to blood loss (chronic): Secondary | ICD-10-CM

## 2023-09-18 DIAGNOSIS — D649 Anemia, unspecified: Secondary | ICD-10-CM | POA: Insufficient documentation

## 2023-09-18 LAB — CBC WITH DIFFERENTIAL (CANCER CENTER ONLY)
Abs Immature Granulocytes: 0.01 10*3/uL (ref 0.00–0.07)
Basophils Absolute: 0 10*3/uL (ref 0.0–0.1)
Basophils Relative: 1 %
Eosinophils Absolute: 0.1 10*3/uL (ref 0.0–0.5)
Eosinophils Relative: 2 %
HCT: 35.9 % — ABNORMAL LOW (ref 36.0–46.0)
Hemoglobin: 10.8 g/dL — ABNORMAL LOW (ref 12.0–15.0)
Immature Granulocytes: 0 %
Lymphocytes Relative: 25 %
Lymphs Abs: 1.3 10*3/uL (ref 0.7–4.0)
MCH: 22.7 pg — ABNORMAL LOW (ref 26.0–34.0)
MCHC: 30.1 g/dL (ref 30.0–36.0)
MCV: 75.6 fL — ABNORMAL LOW (ref 80.0–100.0)
Monocytes Absolute: 0.5 10*3/uL (ref 0.1–1.0)
Monocytes Relative: 10 %
Neutro Abs: 3.1 10*3/uL (ref 1.7–7.7)
Neutrophils Relative %: 62 %
Platelet Count: 257 10*3/uL (ref 150–400)
RBC: 4.75 MIL/uL (ref 3.87–5.11)
RDW: 15.8 % — ABNORMAL HIGH (ref 11.5–15.5)
WBC Count: 5.1 10*3/uL (ref 4.0–10.5)
nRBC: 0 % (ref 0.0–0.2)

## 2023-09-18 LAB — CMP (CANCER CENTER ONLY)
ALT: 17 U/L (ref 0–44)
AST: 23 U/L (ref 15–41)
Albumin: 3.7 g/dL (ref 3.5–5.0)
Alkaline Phosphatase: 55 U/L (ref 38–126)
Anion gap: 5 (ref 5–15)
BUN: 20 mg/dL (ref 8–23)
CO2: 30 mmol/L (ref 22–32)
Calcium: 8.9 mg/dL (ref 8.9–10.3)
Chloride: 106 mmol/L (ref 98–111)
Creatinine: 0.74 mg/dL (ref 0.44–1.00)
GFR, Estimated: >60 mL/min (ref >60)
Glucose, Bld: 101 mg/dL — ABNORMAL HIGH (ref 70–99)
Potassium: 4.3 mmol/L (ref 3.5–5.1)
Sodium: 141 mmol/L (ref 135–145)
Total Bilirubin: 0.2 mg/dL — ABNORMAL LOW (ref 0.3–1.2)
Total Protein: 6.1 g/dL — ABNORMAL LOW (ref 6.5–8.1)

## 2023-09-18 LAB — IRON AND IRON BINDING CAPACITY (CC-WL,HP ONLY)
Iron: 19 ug/dL — ABNORMAL LOW (ref 28–170)
Saturation Ratios: 4 % — ABNORMAL LOW (ref 10.4–31.8)
TIBC: 510 ug/dL — ABNORMAL HIGH (ref 250–450)
UIBC: 491 ug/dL — ABNORMAL HIGH (ref 148–442)

## 2023-09-18 LAB — SAVE SMEAR(SSMR), FOR PROVIDER SLIDE REVIEW

## 2023-09-18 LAB — FERRITIN: Ferritin: 4 ng/mL — ABNORMAL LOW (ref 11–307)

## 2023-09-18 NOTE — Telephone Encounter (Signed)
Faxed referral to Dr. Wallace Cullens office [phone]

## 2023-09-18 NOTE — Progress Notes (Signed)
Hematology and Oncology Follow Up Visit  Natalie Hendrix [id number] [birth year] 72 y.o. 09/18/2023   Principle Diagnosis:  Secondary polycythemia - JAK2 negative   Current Therapy:        Phlebotomy to maintain hematocrit less than 38% Plavix 75 mg by mouth daily   Interim History:  Natalie Hendrix is here today for follow-up and potential phlebotomy.   Today she reports that she is doing ok. She has no concerns that she wishes to discuss.  She has tried to increase her protein level.  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  Hct is 33.9 today. There has been no bleeding to her knowledge: denies epistaxis, gingivitis, hemoptysis, hematemesis, hematuria, melena, excessive bruising, blood donation.  No fever, chills, n/v, cough, rash, chest pain, abdominal pain or changes in bowel or bladder habits.  No swelling in her extremities.  Neuropathy unchanged from baseline.  Appetite and hydration are good.  Wt Readings from Last 3 Encounters:  09/18/23 166 lb (75.3 kg)  08/01/23 167 lb 6.4 oz (75.9 kg)  07/28/23 171 lb 3.2 oz (77.7 kg)     ECOG Performance Status: 1 - Symptomatic but completely ambulatory  Medications:  Allergies as of 09/18/2023       Reactions   Epinephrine Palpitations   Raises heart rate   Flonase [fluticasone] Palpitations   Protriptyline Hcl Other (See Comments)   Severe constipation   Tamiflu [oseltamivir Phosphate] Other (See Comments)   "like I was having a stroke"   Tamiflu [oseltamivir] Anaphylaxis   Tizanidine Other (See Comments)   panic   Gabapentin Other (See Comments)   arthralgia Other reaction(s): Arthralgia (Joint Pain)   Other Hives, Rash   boiron acteane   Qvar [beclomethasone] Other (See Comments)   Spiriva [tiotropium Bromide Monohydrate] Rash   Tiotropium Rash   Zonegran Other (See Comments)   Mood swings   Advair Diskus [fluticasone-salmeterol] Rash   Baclofen Rash   Chlorzoxazone Rash   Lamotrigine Rash    Levofloxacin Rash   Protriptyline Rash   Ropinirole Rash   Tiotropium Bromide Monohydrate Rash   Verapamil Rash        Medication List        Accurate as of September 18, 2023 11:19 AM. If you have any questions, ask your nurse or doctor.          albuterol (2.5 MG/3ML) 0.083% nebulizer solution Commonly known as: PROVENTIL Take 3 mLs (2.5 mg total) by nebulization every 6 (six) hours as needed for wheezing or shortness of breath.   albuterol 108 (90 Base) MCG/ACT inhaler Commonly known as: ProAir HFA Inhale 2 puffs into the lungs every 6 (six) hours as needed for wheezing or shortness of breath.   azelastine 0.1 % nasal spray Commonly known as: ASTELIN Place 1 spray into both nostrils 2 (two) times daily.   beclomethasone 42 MCG/SPRAY nasal spray Commonly known as: BECONASE-AQ 2 puffs each nostril once daily   benzonatate 100 MG capsule Commonly known as: Tessalon Perles Take 1 capsule (100 mg total) by mouth every 6 (six) hours as needed for cough.   buPROPion 300 MG 24 hr tablet Commonly known as: WELLBUTRIN XL Take 1 tablet (300 mg total) by mouth daily with breakfast.   CALCIUM 1200 PO Take 1,250 mg by mouth daily.   Centrum Silver tablet Take 1 tablet by mouth daily.   clopidogrel 75 MG tablet Commonly known as: PLAVIX Take 1 tablet (75 mg total) by mouth daily.  cyclobenzaprine 10 MG tablet Commonly known as: FLEXERIL Take 10 mg by mouth every 8 (eight) hours as needed.   doxycycline 100 MG tablet Commonly known as: VIBRA-TABS Take 1 tablet (100 mg total) by mouth 2 (two) times daily.   ESTRACE VAGINAL 0.1 MG/GM vaginal cream Generic drug: estradiol Place 1 Applicatorful vaginally daily. Small amount each dose per pt   ezetimibe 10 MG tablet Commonly known as: Zetia TAKE 1 TABLET BY MOUTH DAILY.   fexofenadine 180 MG tablet Commonly known as: Allegra Allergy Take 1 tablet (180 mg total) by mouth daily.   Fish Oil 1200 MG Caps Take 1,200  mg by mouth at bedtime.   hydrOXYzine 10 MG tablet Commonly known as: ATARAX Take 10 mg by mouth 3 (three) times daily as needed.   hyoscyamine 0.125 MG Tbdp disintergrating tablet Commonly known as: ANASPAZ Place 1 tablet (0.125 mg total) under the tongue 2 (two) times daily as needed. What changed: reasons to take this   lidocaine 5 % Commonly known as: LIDODERM Place 1 patch onto the skin as needed (pain). Remove & Discard patch within 12 hours or as directed by MD   LORazepam 1 MG tablet Commonly known as: ATIVAN Take 1 tablet (1 mg total) by mouth 2 (two) times daily. May take extra 3rd dose if needed What changed:  when to take this reasons to take this   magnesium gluconate 500 MG tablet Commonly known as: MAGONATE Take 500 mg by mouth daily as needed (constipation).   meclizine 25 MG tablet Commonly known as: ANTIVERT Take 1 tablet (25 mg total) by mouth 3 (three) times daily as needed for dizziness. 3 month supply What changed: when to take this   metoprolol succinate 50 MG 24 hr tablet Commonly known as: TOPROL-XL Take 1 tablet (50 mg total) by mouth daily with breakfast. What changed: how much to take   morphine 15 MG tablet Commonly known as: MSIR Take 15 mg by mouth every 4 (four) hours as needed for severe pain.   morphine 30 MG 12 hr tablet Commonly known as: MS CONTIN Take 1 tablet by mouth every 12 (twelve) hours.   Nebulizer Devi Use as directed   OXYGEN Inhale 2.5 L/hr into the lungs continuous.   pantoprazole 40 MG tablet Commonly known as: PROTONIX TAKE 1 TABLET (40 MG TOTAL) BY MOUTH DAILY.   Polyethyl Glycol-Propyl Glycol 0.4-0.3 % Soln Apply 1 drop to eye daily as needed (dry eyes).   pravastatin 40 MG tablet Commonly known as: PRAVACHOL Take 1 tablet (40 mg total) by mouth at bedtime.   promethazine 25 MG tablet Commonly known as: PHENERGAN Take 25 mg by mouth every 8 (eight) hours as needed for nausea or vomiting.   Zovirax 5  % Generic drug: acyclovir ointment Apply 1 application topically as needed (flair).        Allergies:  Allergies  Allergen Reactions   Epinephrine Palpitations    Raises heart rate    Flonase [Fluticasone] Palpitations   Protriptyline Hcl Other (See Comments)    Severe constipation   Tamiflu [Oseltamivir Phosphate] Other (See Comments)    "like I was having a stroke"   Tamiflu [Oseltamivir] Anaphylaxis   Tizanidine Other (See Comments)    panic   Gabapentin Other (See Comments)    arthralgia Other reaction(s): Arthralgia (Joint Pain)   Other Hives and Rash    boiron acteane   Qvar [Beclomethasone] Other (See Comments)   Spiriva [Tiotropium Bromide Monohydrate] Rash  Tiotropium Rash   Zonegran Other (See Comments)    Mood swings   Advair Diskus [Fluticasone-Salmeterol] Rash   Baclofen Rash   Chlorzoxazone Rash   Lamotrigine Rash   Levofloxacin Rash   Protriptyline Rash   Ropinirole Rash   Tiotropium Bromide Monohydrate Rash   Verapamil Rash    Past Medical History, Surgical history, Social history, and Family History were reviewed and updated.  Review of Systems: All other 10 point review of systems is negative.   Physical Exam:  weight is 166 lb (75.3 kg). Her oral temperature is 98.5 F (36.9 C). Her blood pressure is 114/58 (abnormal) and her pulse is 79. Her respiration is 18 and oxygen saturation is 97%.   Wt Readings from Last 3 Encounters:  09/18/23 166 lb (75.3 kg)  08/01/23 167 lb 6.4 oz (75.9 kg)  07/28/23 171 lb 3.2 oz (77.7 kg)    Ocular: Sclerae unicteric, pupils equal, round and reactive to light Ear-nose-throat: Oropharynx clear, dentition fair Lymphatic: No cervical or supraclavicular adenopathy Lungs no rales or rhonchi, good excursion bilaterally Heart regular rate and rhythm, no murmur appreciated Abd soft, nontender, positive bowel sounds MSK no focal spinal tenderness, no joint edema Neuro: non-focal, well-oriented, appropriate  affect  Lab Results  Component Value Date   WBC 5.1 09/18/2023   HGB 10.8 (L) 09/18/2023   HCT 35.9 (L) 09/18/2023   MCV 75.6 (L) 09/18/2023   PLT 257 09/18/2023   Lab Results  Component Value Date   FERRITIN 5 (L) 08/01/2023   IRON 43 08/01/2023   TIBC 517 (H) 08/01/2023   UIBC 474 08/01/2023   IRONPCTSAT 8 (L) 08/01/2023   Lab Results  Component Value Date   RETICCTPCT 1.1 04/27/2015   RBC 4.75 09/18/2023   RETICCTABS 55.1 04/27/2015   No results found for: "KPAFRELGTCHN", "LAMBDASER", "KAPLAMBRATIO" No results found for: "IGGSERUM", "IGA", "IGMSERUM" No results found for: "TOTALPROTELP", "ALBUMINELP", "A1GS", "A2GS", "BETS", "BETA2SER", "GAMS", "MSPIKE", "SPEI"   Chemistry      Component Value Date/Time   NA 141 09/18/2023 1005   NA 147 (H) 10/08/2017 1410   NA 141 02/17/2017 1132   K 4.3 09/18/2023 1005   K 4.6 10/08/2017 1410   K 5.3 (H) 02/17/2017 1132   CL 106 09/18/2023 1005   CL 106 10/08/2017 1410   CO2 30 09/18/2023 1005   CO2 30 10/08/2017 1410   CO2 29 02/17/2017 1132   BUN 20 09/18/2023 1005   BUN 15 10/08/2017 1410   BUN 17.8 02/17/2017 1132   CREATININE 0.74 09/18/2023 1005   CREATININE 1.0 10/08/2017 1410   CREATININE 0.8 02/17/2017 1132      Component Value Date/Time   CALCIUM 8.9 09/18/2023 1005   CALCIUM 9.0 10/08/2017 1410   CALCIUM 9.3 02/17/2017 1132   ALKPHOS 55 09/18/2023 1005   ALKPHOS 59 10/08/2017 1410   ALKPHOS 79 02/17/2017 1132   AST 23 09/18/2023 1005   AST 22 02/17/2017 1132   ALT 17 09/18/2023 1005   ALT 26 10/08/2017 1410   ALT 20 02/17/2017 1132   BILITOT 0.2 (L) 09/18/2023 1005   BILITOT 0.39 02/17/2017 1132      Encounter Diagnoses  Name Primary?   Iron deficiency anemia due to chronic blood loss Yes   Nutritional anemia    Positive colorectal cancer screening using Cologuard test     Impression and Plan: Natalie Hendrix is a very pleasant 72 yo caucasian female with secondary polycythemia (JAK-2 negative) due to  COPD. Now has  anemia suspected to be secondary to phlebotomy however she had a Cologuard on 08/26/2023 which was positive. She has also had unintentional weight loss.   No phlebotomy today.  Iron studied pending.  Encouraged GI work up RTC 6 weeks APP, labs ( CBC w/, CMP, erythropoietin, iron, ferritin, B12, folate)  Rushie Chestnut, PA-C 10/17/202411:19 AM

## 2023-10-14 ENCOUNTER — Telehealth: Payer: Self-pay

## 2023-10-14 NOTE — Telephone Encounter (Signed)
Pt called in wanting to know if she can take Prevagen or Neuriva in addition to her current medications. Pt has seen positive results with husband who is taking Prevagen and is interested in taking it as well. Pt is concerned this may affect her RBC production because it has vitamin b6, folate and B2 in it. Please advise, thank you.

## 2023-10-15 ENCOUNTER — Encounter: Payer: Self-pay | Admitting: Family

## 2023-10-17 ENCOUNTER — Telehealth: Payer: Self-pay | Admitting: Internal Medicine

## 2023-10-17 NOTE — Telephone Encounter (Signed)
Patient called answering service with SOB.  I returned call and no answer.  Leaving VM on machine to either call back or go to ER if breathing is that bad.  Myrla Halsted MD PCCM

## 2023-10-19 NOTE — Progress Notes (Deleted)
  Cardiology Office Note:  .   Date:  10/19/2023  ID:  Natalie Hendrix, DOB [birth year], MRN [id number] PCP: Dois Davenport, MD  [facility] Providers Cardiologist: Bryan Lemma, MD {}   }   History of Present Illness: .   Natalie Hendrix is a 72 y.o. female with PMH polycythemia vera (phlebotomy dependent), hyperlipidemia with chronic oxygen dependent COPD/dyspnea, and palpitations.  Last seen by Dr. Herbie Baltimore on 12/30/2019.  She did not have any cardiac related symptoms.  Echocardiogram on 12/08/2019 revealed normal LVEF of 60 to 65% with borderline left ventricular hypertrophy.  No valvular abnormalities with normal pulmonary systolic pressure.  ROS: ***  Studies Reviewed: .        *** EKG Interpretation Date/Time:    Ventricular Rate:    PR Interval:    QRS Duration:    QT Interval:    QTC Calculation:   R Axis:      Text Interpretation:      Physical Exam:   VS:  There were no vitals taken for this visit.   Wt Readings from Last 3 Encounters:  09/18/23 166 lb (75.3 kg)  08/01/23 167 lb 6.4 oz (75.9 kg)  07/28/23 171 lb 3.2 oz (77.7 kg)    GEN: Well nourished, well developed in no acute distress NECK: No JVD; No carotid bruits CARDIAC: ***RRR, no murmurs, rubs, gallops RESPIRATORY:  Clear to auscultation without rales, wheezing or rhonchi  ABDOMEN: Soft, non-tender, non-distended EXTREMITIES:  No edema; No deformity   ASSESSMENT AND PLAN: .   ***    {Are you ordering a CV Procedure (e.g. stress test, cath, DCCV, TEE, etc)?   Press F2        :[id number]}    Signed, Bettey Mare. Liborio Nixon, ANP, AACC

## 2023-10-20 ENCOUNTER — Telehealth: Payer: Self-pay | Admitting: Internal Medicine

## 2023-10-20 NOTE — Telephone Encounter (Signed)
PT calling not feeling well. Coughing up green and low grade fever. Has had to increase her 52 Wonders if Dr can call in something for her.  [facility] [phone]

## 2023-10-20 NOTE — Telephone Encounter (Signed)
Called And spoke with patient regarding prior message.Patient stated she is at another Doctor's office and will need to call our office back .

## 2023-10-21 ENCOUNTER — Ambulatory Visit: Payer: Medicare HMO | Admitting: Adult Health

## 2023-10-21 NOTE — Telephone Encounter (Signed)
Left message on VM for patient to call clinic to get more information regarding symptoms and medications.

## 2023-10-23 NOTE — Telephone Encounter (Signed)
2 ret calls. Closing encounter

## 2023-10-29 ENCOUNTER — Inpatient Hospital Stay: Payer: Medicare HMO | Attending: Hematology & Oncology

## 2023-10-29 ENCOUNTER — Encounter: Payer: Self-pay | Admitting: Medical Oncology

## 2023-10-29 ENCOUNTER — Inpatient Hospital Stay (HOSPITAL_BASED_OUTPATIENT_CLINIC_OR_DEPARTMENT_OTHER): Payer: Medicare HMO | Admitting: Medical Oncology

## 2023-10-29 VITALS — BP 103/61 | HR 65 | Temp 98.3°F | Resp 19 | Ht 67.0 in | Wt 170.4 lb

## 2023-10-29 DIAGNOSIS — D751 Secondary polycythemia: Secondary | ICD-10-CM | POA: Insufficient documentation

## 2023-10-29 DIAGNOSIS — R634 Abnormal weight loss: Secondary | ICD-10-CM | POA: Insufficient documentation

## 2023-10-29 DIAGNOSIS — D500 Iron deficiency anemia secondary to blood loss (chronic): Secondary | ICD-10-CM

## 2023-10-29 DIAGNOSIS — Z7902 Long term (current) use of antithrombotics/antiplatelets: Secondary | ICD-10-CM | POA: Insufficient documentation

## 2023-10-29 DIAGNOSIS — D539 Nutritional anemia, unspecified: Secondary | ICD-10-CM

## 2023-10-29 DIAGNOSIS — J449 Chronic obstructive pulmonary disease, unspecified: Secondary | ICD-10-CM | POA: Diagnosis not present

## 2023-10-29 DIAGNOSIS — D509 Iron deficiency anemia, unspecified: Secondary | ICD-10-CM | POA: Insufficient documentation

## 2023-10-29 LAB — FOLATE: Folate: 24.8 ng/mL (ref >5.9)

## 2023-10-29 LAB — CBC WITH DIFFERENTIAL (CANCER CENTER ONLY)
Abs Immature Granulocytes: 0.05 10*3/uL (ref 0.00–0.07)
Basophils Absolute: 0 10*3/uL (ref 0.0–0.1)
Basophils Relative: 1 %
Eosinophils Absolute: 0.1 10*3/uL (ref 0.0–0.5)
Eosinophils Relative: 2 %
HCT: 35.6 % — ABNORMAL LOW (ref 36.0–46.0)
Hemoglobin: 10.8 g/dL — ABNORMAL LOW (ref 12.0–15.0)
Immature Granulocytes: 1 %
Lymphocytes Relative: 26 %
Lymphs Abs: 1.2 10*3/uL (ref 0.7–4.0)
MCH: 23.4 pg — ABNORMAL LOW (ref 26.0–34.0)
MCHC: 30.3 g/dL (ref 30.0–36.0)
MCV: 77.1 fL — ABNORMAL LOW (ref 80.0–100.0)
Monocytes Absolute: 0.5 10*3/uL (ref 0.1–1.0)
Monocytes Relative: 10 %
Neutro Abs: 2.8 10*3/uL (ref 1.7–7.7)
Neutrophils Relative %: 60 %
Platelet Count: 234 10*3/uL (ref 150–400)
RBC: 4.62 MIL/uL (ref 3.87–5.11)
RDW: 16.9 % — ABNORMAL HIGH (ref 11.5–15.5)
WBC Count: 4.7 10*3/uL (ref 4.0–10.5)
nRBC: 0 % (ref 0.0–0.2)

## 2023-10-29 LAB — CMP (CANCER CENTER ONLY)
ALT: 12 U/L (ref 0–44)
AST: 20 U/L (ref 15–41)
Albumin: 3.6 g/dL (ref 3.5–5.0)
Alkaline Phosphatase: 55 U/L (ref 38–126)
Anion gap: 7 (ref 5–15)
BUN: 21 mg/dL (ref 8–23)
CO2: 29 mmol/L (ref 22–32)
Calcium: 9.2 mg/dL (ref 8.9–10.3)
Chloride: 105 mmol/L (ref 98–111)
Creatinine: 0.74 mg/dL (ref 0.44–1.00)
GFR, Estimated: >60 mL/min (ref >60)
Glucose, Bld: 94 mg/dL (ref 70–99)
Potassium: 4.1 mmol/L (ref 3.5–5.1)
Sodium: 141 mmol/L (ref 135–145)
Total Bilirubin: 0.3 mg/dL (ref <1.2)
Total Protein: 5.8 g/dL — ABNORMAL LOW (ref 6.5–8.1)

## 2023-10-29 LAB — IRON AND IRON BINDING CAPACITY (CC-WL,HP ONLY)
Iron: 49 ug/dL (ref 28–170)
Saturation Ratios: 11 % (ref 10.4–31.8)
TIBC: 451 ug/dL — ABNORMAL HIGH (ref 250–450)
UIBC: 402 ug/dL (ref 148–442)

## 2023-10-29 LAB — FERRITIN: Ferritin: 6 ng/mL — ABNORMAL LOW (ref 11–307)

## 2023-10-29 LAB — VITAMIN B12: Vitamin B-12: 421 pg/mL (ref 180–914)

## 2023-10-29 NOTE — Progress Notes (Signed)
Hematology and Oncology Follow Up Visit  Natalie Hendrix [id number] 10-03-1951 72 y.o. 10/29/2023   Principle Diagnosis:  Secondary polycythemia - JAK2 negative IDA- felt to be secondary to potential GI bleed- positive Cologuard    Current Therapy:        Phlebotomy to maintain hematocrit less than 38% Plavix 75 mg by mouth daily   Interim History:  Natalie Hendrix is here today for follow-up and potential phlebotomy.   Today she states that she is ok- she is tired She has a history of new IDA with positive Cologuard. She has an appointment next week to establish with GI. No visualized bleeding  There has been no bleeding to her knowledge: denies epistaxis, gingivitis, hemoptysis, hematemesis, hematuria, melena, excessive bruising, blood donation.  No fever, chills, n/v, cough, rash, chest pain, abdominal pain or changes in bowel or bladder habits.  No swelling in her extremities.  Neuropathy unchanged from baseline.  Appetite and hydration are good.  Wt Readings from Last 3 Encounters:  10/29/23 170 lb 6.4 oz (77.3 kg)  09/18/23 166 lb (75.3 kg)  08/01/23 167 lb 6.4 oz (75.9 kg)     ECOG Performance Status: 1 - Symptomatic but completely ambulatory  Medications:  Allergies as of 10/29/2023       Reactions   Epinephrine Palpitations   Raises heart rate   Flonase [fluticasone] Palpitations   Protriptyline Hcl Other (See Comments)   Severe constipation   Tamiflu [oseltamivir Phosphate] Other (See Comments)   "like I was having a stroke"   Tamiflu [oseltamivir] Anaphylaxis   Tizanidine Other (See Comments)   panic   Gabapentin Other (See Comments)   arthralgia Other reaction(s): Arthralgia (Joint Pain)   Other Hives, Rash   boiron acteane   Qvar [beclomethasone] Other (See Comments)   Spiriva [tiotropium Bromide Monohydrate] Rash   Tiotropium Rash   Zonegran Other (See Comments)   Mood swings   Advair Diskus [fluticasone-salmeterol] Rash   Baclofen Rash    Chlorzoxazone Rash   Lamotrigine Rash   Levofloxacin Rash   Protriptyline Rash   Ropinirole Rash   Tiotropium Bromide Monohydrate Rash   Verapamil Rash        Medication List        Accurate as of October 29, 2023  1:59 PM. If you have any questions, ask your nurse or doctor.          albuterol (2.5 MG/3ML) 0.083% nebulizer solution Commonly known as: PROVENTIL Take 3 mLs (2.5 mg total) by nebulization every 6 (six) hours as needed for wheezing or shortness of breath.   albuterol 108 (90 Base) MCG/ACT inhaler Commonly known as: ProAir HFA Inhale 2 puffs into the lungs every 6 (six) hours as needed for wheezing or shortness of breath.   azelastine 0.1 % nasal spray Commonly known as: ASTELIN Place 1 spray into both nostrils 2 (two) times daily.   beclomethasone 42 MCG/SPRAY nasal spray Commonly known as: BECONASE-AQ 2 puffs each nostril once daily   benzonatate 100 MG capsule Commonly known as: Tessalon Perles Take 1 capsule (100 mg total) by mouth every 6 (six) hours as needed for cough.   buPROPion 300 MG 24 hr tablet Commonly known as: WELLBUTRIN XL Take 1 tablet (300 mg total) by mouth daily with breakfast.   CALCIUM 1200 PO Take 1,250 mg by mouth daily.   Centrum Silver tablet Take 1 tablet by mouth daily.   clopidogrel 75 MG tablet Commonly known as: PLAVIX Take 1 tablet (75 mg  total) by mouth daily.   cyclobenzaprine 10 MG tablet Commonly known as: FLEXERIL Take 10 mg by mouth every 8 (eight) hours as needed.   doxycycline 100 MG tablet Commonly known as: VIBRA-TABS Take 1 tablet (100 mg total) by mouth 2 (two) times daily.   ESTRACE VAGINAL 0.1 MG/GM vaginal cream Generic drug: estradiol Place 1 Applicatorful vaginally daily. Small amount each dose per pt   ezetimibe 10 MG tablet Commonly known as: Zetia TAKE 1 TABLET BY MOUTH DAILY.   fexofenadine 180 MG tablet Commonly known as: Allegra Allergy Take 1 tablet (180 mg total) by mouth  daily.   Fish Oil 1200 MG Caps Take 1,200 mg by mouth at bedtime.   hydrOXYzine 10 MG tablet Commonly known as: ATARAX Take 10 mg by mouth 3 (three) times daily as needed.   hyoscyamine 0.125 MG Tbdp disintergrating tablet Commonly known as: ANASPAZ Place 1 tablet (0.125 mg total) under the tongue 2 (two) times daily as needed. What changed: reasons to take this   lidocaine 5 % Commonly known as: LIDODERM Place 1 patch onto the skin as needed (pain). Remove & Discard patch within 12 hours or as directed by MD   LORazepam 1 MG tablet Commonly known as: ATIVAN Take 1 tablet (1 mg total) by mouth 2 (two) times daily. May take extra 3rd dose if needed What changed:  when to take this reasons to take this   magnesium gluconate 500 MG tablet Commonly known as: MAGONATE Take 500 mg by mouth daily as needed (constipation).   meclizine 25 MG tablet Commonly known as: ANTIVERT Take 1 tablet (25 mg total) by mouth 3 (three) times daily as needed for dizziness. 3 month supply What changed: when to take this   metoprolol succinate 50 MG 24 hr tablet Commonly known as: TOPROL-XL Take 1 tablet (50 mg total) by mouth daily with breakfast. What changed: how much to take   morphine 15 MG tablet Commonly known as: MSIR Take 15 mg by mouth every 4 (four) hours as needed for severe pain.   morphine 30 MG 12 hr tablet Commonly known as: MS CONTIN Take 1 tablet by mouth every 12 (twelve) hours.   Nebulizer Devi Use as directed   OXYGEN Inhale 2.5 L/hr into the lungs continuous.   pantoprazole 40 MG tablet Commonly known as: PROTONIX TAKE 1 TABLET (40 MG TOTAL) BY MOUTH DAILY.   Polyethyl Glycol-Propyl Glycol 0.4-0.3 % Soln Apply 1 drop to eye daily as needed (dry eyes).   pravastatin 40 MG tablet Commonly known as: PRAVACHOL Take 1 tablet (40 mg total) by mouth at bedtime.   promethazine 25 MG tablet Commonly known as: PHENERGAN Take 25 mg by mouth every 8 (eight) hours as  needed for nausea or vomiting.   Zovirax 5 % Generic drug: acyclovir ointment Apply 1 application topically as needed (flair).        Allergies:  Allergies  Allergen Reactions   Epinephrine Palpitations    Raises heart rate    Flonase [Fluticasone] Palpitations   Protriptyline Hcl Other (See Comments)    Severe constipation   Tamiflu [Oseltamivir Phosphate] Other (See Comments)    "like I was having a stroke"   Tamiflu [Oseltamivir] Anaphylaxis   Tizanidine Other (See Comments)    panic   Gabapentin Other (See Comments)    arthralgia Other reaction(s): Arthralgia (Joint Pain)   Other Hives and Rash    boiron acteane   Qvar [Beclomethasone] Other (See Comments)   Spiriva [  Tiotropium Bromide Monohydrate] Rash   Tiotropium Rash   Zonegran Other (See Comments)    Mood swings   Advair Diskus [Fluticasone-Salmeterol] Rash   Baclofen Rash   Chlorzoxazone Rash   Lamotrigine Rash   Levofloxacin Rash   Protriptyline Rash   Ropinirole Rash   Tiotropium Bromide Monohydrate Rash   Verapamil Rash    Past Medical History, Surgical history, Social history, and Family History were reviewed and updated.  Review of Systems: All other 10 point review of systems is negative.   Physical Exam:  height is 5\' 7"  (1.702 m) and weight is 170 lb 6.4 oz (77.3 kg). Her oral temperature is 98.3 F (36.8 C). Her blood pressure is 103/61 and her pulse is 65. Her respiration is 19 and oxygen saturation is 99%.   Wt Readings from Last 3 Encounters:  10/29/23 170 lb 6.4 oz (77.3 kg)  09/18/23 166 lb (75.3 kg)  08/01/23 167 lb 6.4 oz (75.9 kg)   Constitutional: Using rolling walker  Ocular: Sclerae unicteric, pupils equal, round and reactive to light Ear-nose-throat: Oropharynx clear, dentition fair Lymphatic: No cervical or supraclavicular adenopathy Lungs no rales or rhonchi, good excursion bilaterally Heart regular rate and rhythm, no murmur appreciated Abd soft, nontender, positive  bowel sounds MSK no focal spinal tenderness, no joint edema Neuro: non-focal, well-oriented, appropriate affect  Lab Results  Component Value Date   WBC 4.7 10/29/2023   HGB 10.8 (L) 10/29/2023   HCT 35.6 (L) 10/29/2023   MCV 77.1 (L) 10/29/2023   PLT 234 10/29/2023   Lab Results  Component Value Date   FERRITIN 4 (L) 09/18/2023   IRON 19 (L) 09/18/2023   TIBC 510 (H) 09/18/2023   UIBC 491 (H) 09/18/2023   IRONPCTSAT 4 (L) 09/18/2023   Lab Results  Component Value Date   RETICCTPCT 1.1 04/27/2015   RBC 4.62 10/29/2023   RETICCTABS 55.1 04/27/2015   No results found for: "KPAFRELGTCHN", "LAMBDASER", "KAPLAMBRATIO" No results found for: "IGGSERUM", "IGA", "IGMSERUM" No results found for: "TOTALPROTELP", "ALBUMINELP", "A1GS", "A2GS", "BETS", "BETA2SER", "GAMS", "MSPIKE", "SPEI"   Chemistry      Component Value Date/Time   NA 141 10/29/2023 1313   NA 147 (H) 10/08/2017 1410   NA 141 02/17/2017 1132   K 4.1 10/29/2023 1313   K 4.6 10/08/2017 1410   K 5.3 (H) 02/17/2017 1132   CL 105 10/29/2023 1313   CL 106 10/08/2017 1410   CO2 29 10/29/2023 1313   CO2 30 10/08/2017 1410   CO2 29 02/17/2017 1132   BUN 21 10/29/2023 1313   BUN 15 10/08/2017 1410   BUN 17.8 02/17/2017 1132   CREATININE 0.74 10/29/2023 1313   CREATININE 1.0 10/08/2017 1410   CREATININE 0.8 02/17/2017 1132      Component Value Date/Time   CALCIUM 9.2 10/29/2023 1313   CALCIUM 9.0 10/08/2017 1410   CALCIUM 9.3 02/17/2017 1132   ALKPHOS 55 10/29/2023 1313   ALKPHOS 59 10/08/2017 1410   ALKPHOS 79 02/17/2017 1132   AST 20 10/29/2023 1313   AST 22 02/17/2017 1132   ALT 12 10/29/2023 1313   ALT 26 10/08/2017 1410   ALT 20 02/17/2017 1132   BILITOT 0.3 10/29/2023 1313   BILITOT 0.39 02/17/2017 1132      Encounter Diagnoses  Name Primary?   Iron deficiency anemia due to chronic blood loss    Nutritional anemia    Polycythemia, secondary Yes   Impression and Plan: Ms. Mcgruder is a very pleasant  72  yo caucasian female with secondary polycythemia (JAK-2 negative) due to COPD. Now has anemia suspected to be secondary to phlebotomy however she had a Cologuard on 08/26/2023 which was positive. She has also had unintentional weight loss.   No phlebotomy today.  Iron studied pending. She will start a once every morning iron supplement  Keep GI appointment which is next week RTC 4 weeks APP, labs ( CBC w/, CMP, iron, ferritin)  Rushie Chestnut, PA-C 11/27/20241:59 PM

## 2023-10-29 NOTE — Patient Instructions (Signed)
Start an iron supplement in the morning with breakfast. Have a few sips of orange juice with it for best absorption.

## 2023-11-24 ENCOUNTER — Encounter: Payer: Self-pay | Admitting: Family

## 2023-11-24 ENCOUNTER — Inpatient Hospital Stay (HOSPITAL_BASED_OUTPATIENT_CLINIC_OR_DEPARTMENT_OTHER): Payer: Medicare HMO | Admitting: Medical Oncology

## 2023-11-24 ENCOUNTER — Inpatient Hospital Stay: Payer: Medicare HMO | Attending: Hematology & Oncology

## 2023-11-24 VITALS — BP 126/89 | HR 74 | Temp 97.7°F | Resp 19 | Ht 67.0 in | Wt 167.0 lb

## 2023-11-24 DIAGNOSIS — Z8673 Personal history of transient ischemic attack (TIA), and cerebral infarction without residual deficits: Secondary | ICD-10-CM

## 2023-11-24 DIAGNOSIS — D500 Iron deficiency anemia secondary to blood loss (chronic): Secondary | ICD-10-CM

## 2023-11-24 DIAGNOSIS — D509 Iron deficiency anemia, unspecified: Secondary | ICD-10-CM | POA: Insufficient documentation

## 2023-11-24 DIAGNOSIS — D751 Secondary polycythemia: Secondary | ICD-10-CM | POA: Insufficient documentation

## 2023-11-24 LAB — CMP (CANCER CENTER ONLY)
ALT: 15 U/L (ref 0–44)
AST: 18 U/L (ref 15–41)
Albumin: 3.8 g/dL (ref 3.5–5.0)
Alkaline Phosphatase: 57 U/L (ref 38–126)
Anion gap: 5 (ref 5–15)
BUN: 15 mg/dL (ref 8–23)
CO2: 30 mmol/L (ref 22–32)
Calcium: 9.1 mg/dL (ref 8.9–10.3)
Chloride: 107 mmol/L (ref 98–111)
Creatinine: 0.77 mg/dL (ref 0.44–1.00)
GFR, Estimated: >60 mL/min (ref >60)
Glucose, Bld: 91 mg/dL (ref 70–99)
Potassium: 4.2 mmol/L (ref 3.5–5.1)
Sodium: 142 mmol/L (ref 135–145)
Total Bilirubin: 0.5 mg/dL (ref <1.2)
Total Protein: 6.1 g/dL — ABNORMAL LOW (ref 6.5–8.1)

## 2023-11-24 LAB — CBC
HCT: 37.7 % (ref 36.0–46.0)
Hemoglobin: 11.7 g/dL — ABNORMAL LOW (ref 12.0–15.0)
MCH: 24 pg — ABNORMAL LOW (ref 26.0–34.0)
MCHC: 31 g/dL (ref 30.0–36.0)
MCV: 77.3 fL — ABNORMAL LOW (ref 80.0–100.0)
Platelets: 232 10*3/uL (ref 150–400)
RBC: 4.88 MIL/uL (ref 3.87–5.11)
RDW: 18.5 % — ABNORMAL HIGH (ref 11.5–15.5)
WBC: 4.8 10*3/uL (ref 4.0–10.5)
nRBC: 0 % (ref 0.0–0.2)

## 2023-11-24 LAB — RETIC PANEL
Immature Retic Fract: 9.9 % (ref 2.3–15.9)
RBC.: 4.92 MIL/uL (ref 3.87–5.11)
Retic Count, Absolute: 42.3 10*3/uL (ref 19.0–186.0)
Retic Ct Pct: 0.9 % (ref 0.4–3.1)
Reticulocyte Hemoglobin: 28.3 pg (ref >27.9)

## 2023-11-24 LAB — FERRITIN: Ferritin: 4 ng/mL — ABNORMAL LOW (ref 11–307)

## 2023-11-24 NOTE — Progress Notes (Addendum)
Hematology and Oncology Follow Up Visit  Natalie Hendrix [id number] 1951-10-30 72 y.o. 11/24/2023   Principle Diagnosis:  Secondary polycythemia - JAK2 negative IDA- felt to be secondary to potential GI bleed- positive Cologuard    Current Therapy:        Phlebotomy to maintain hematocrit less than 38% Plavix 75 mg by mouth daily   Interim History:  Natalie Hendrix is here today for follow-up. She is doing fairly well right now. She still has bouts with vertigo at times as well as intermittent headaches.  No falls or syncope reported this visit. She ambulates with her Rolator for added support.  No fever, chills, n/v, cough, rash, abdominal pain or changes in bowel or bladder habits.  She states that she has palpitations with bouts of atrial fib.  No chest pain at this time.  No swelling in her extremities.  Appetite is a little better. Weight today is 167 lbs.  She admits that her stress has been high while caring for her husband and now that he is eating a little better she is trying to too.  She has an appointment with GI on 12/24/2023.  No blood loss noted. No petechiae.   ECOG Performance Status: 1 - Symptomatic but completely ambulatory  Medications:  Allergies as of 11/24/2023       Reactions   Epinephrine Palpitations   Raises heart rate   Flonase [fluticasone] Palpitations   Protriptyline Hcl Other (See Comments)   Severe constipation   Tamiflu [oseltamivir Phosphate] Other (See Comments)   "like I was having a stroke"   Tamiflu [oseltamivir] Anaphylaxis   Tizanidine Other (See Comments)   panic   Gabapentin Other (See Comments)   arthralgia Other reaction(s): Arthralgia (Joint Pain)   Other Hives, Rash   boiron acteane   Qvar [beclomethasone] Other (See Comments)   Spiriva [tiotropium Bromide Monohydrate] Rash   Tiotropium Rash   Zonegran Other (See Comments)   Mood swings   Advair Diskus [fluticasone-salmeterol] Rash   Baclofen Rash   Chlorzoxazone Rash    Lamotrigine Rash   Levofloxacin Rash   Protriptyline Rash   Ropinirole Rash   Tiotropium Bromide Monohydrate Rash   Verapamil Rash        Medication List        Accurate as of November 24, 2023  2:24 PM. If you have any questions, ask your nurse or doctor.          albuterol (2.5 MG/3ML) 0.083% nebulizer solution Commonly known as: PROVENTIL Take 3 mLs (2.5 mg total) by nebulization every 6 (six) hours as needed for wheezing or shortness of breath.   albuterol 108 (90 Base) MCG/ACT inhaler Commonly known as: ProAir HFA Inhale 2 puffs into the lungs every 6 (six) hours as needed for wheezing or shortness of breath.   azelastine 0.1 % nasal spray Commonly known as: ASTELIN Place 1 spray into both nostrils 2 (two) times daily.   beclomethasone 42 MCG/SPRAY nasal spray Commonly known as: BECONASE-AQ 2 puffs each nostril once daily   benzonatate 100 MG capsule Commonly known as: Tessalon Perles Take 1 capsule (100 mg total) by mouth every 6 (six) hours as needed for cough.   buPROPion 300 MG 24 hr tablet Commonly known as: WELLBUTRIN XL Take 1 tablet (300 mg total) by mouth daily with breakfast.   CALCIUM 1200 PO Take 1,250 mg by mouth daily.   Centrum Silver tablet Take 1 tablet by mouth daily.   clopidogrel 75 MG tablet Commonly  known as: PLAVIX Take 1 tablet (75 mg total) by mouth daily.   cyclobenzaprine 10 MG tablet Commonly known as: FLEXERIL Take 10 mg by mouth every 8 (eight) hours as needed.   doxycycline 100 MG tablet Commonly known as: VIBRA-TABS Take 1 tablet (100 mg total) by mouth 2 (two) times daily.   ESTRACE VAGINAL 0.1 MG/GM vaginal cream Generic drug: estradiol Place 1 Applicatorful vaginally daily. Small amount each dose per pt   ezetimibe 10 MG tablet Commonly known as: Zetia TAKE 1 TABLET BY MOUTH DAILY.   fexofenadine 180 MG tablet Commonly known as: Allegra Allergy Take 1 tablet (180 mg total) by mouth daily.   Fish Oil 1200  MG Caps Take 1,200 mg by mouth at bedtime.   hydrOXYzine 10 MG tablet Commonly known as: ATARAX Take 10 mg by mouth 3 (three) times daily as needed.   hyoscyamine 0.125 MG Tbdp disintergrating tablet Commonly known as: ANASPAZ Place 1 tablet (0.125 mg total) under the tongue 2 (two) times daily as needed. What changed: reasons to take this   lidocaine 5 % Commonly known as: LIDODERM Place 1 patch onto the skin as needed (pain). Remove & Discard patch within 12 hours or as directed by MD   LORazepam 1 MG tablet Commonly known as: ATIVAN Take 1 tablet (1 mg total) by mouth 2 (two) times daily. May take extra 3rd dose if needed What changed:  when to take this reasons to take this   magnesium gluconate 500 MG tablet Commonly known as: MAGONATE Take 500 mg by mouth daily as needed (constipation).   meclizine 25 MG tablet Commonly known as: ANTIVERT Take 1 tablet (25 mg total) by mouth 3 (three) times daily as needed for dizziness. 3 month supply What changed: when to take this   metoprolol succinate 50 MG 24 hr tablet Commonly known as: TOPROL-XL Take 1 tablet (50 mg total) by mouth daily with breakfast. What changed: how much to take   morphine 15 MG tablet Commonly known as: MSIR Take 15 mg by mouth every 4 (four) hours as needed for severe pain.   morphine 30 MG 12 hr tablet Commonly known as: MS CONTIN Take 1 tablet by mouth every 12 (twelve) hours.   Nebulizer Devi Use as directed   OXYGEN Inhale 2.5 L/hr into the lungs continuous.   pantoprazole 40 MG tablet Commonly known as: PROTONIX TAKE 1 TABLET (40 MG TOTAL) BY MOUTH DAILY.   Polyethyl Glycol-Propyl Glycol 0.4-0.3 % Soln Apply 1 drop to eye daily as needed (dry eyes).   pravastatin 40 MG tablet Commonly known as: PRAVACHOL Take 1 tablet (40 mg total) by mouth at bedtime.   promethazine 25 MG tablet Commonly known as: PHENERGAN Take 25 mg by mouth every 8 (eight) hours as needed for nausea or  vomiting.   Zovirax 5 % Generic drug: acyclovir ointment Apply 1 application topically as needed (flair).        Allergies:  Allergies  Allergen Reactions   Epinephrine Palpitations    Raises heart rate    Flonase [Fluticasone] Palpitations   Protriptyline Hcl Other (See Comments)    Severe constipation   Tamiflu [Oseltamivir Phosphate] Other (See Comments)    "like I was having a stroke"   Tamiflu [Oseltamivir] Anaphylaxis   Tizanidine Other (See Comments)    panic   Gabapentin Other (See Comments)    arthralgia Other reaction(s): Arthralgia (Joint Pain)   Other Hives and Rash    boiron acteane  Qvar [Beclomethasone] Other (See Comments)   Spiriva [Tiotropium Bromide Monohydrate] Rash   Tiotropium Rash   Zonegran Other (See Comments)    Mood swings   Advair Diskus [Fluticasone-Salmeterol] Rash   Baclofen Rash   Chlorzoxazone Rash   Lamotrigine Rash   Levofloxacin Rash   Protriptyline Rash   Ropinirole Rash   Tiotropium Bromide Monohydrate Rash   Verapamil Rash    Past Medical History, Surgical history, Social history, and Family History were reviewed and updated.  Review of Systems: All other 10 point review of systems is negative.   Physical Exam:  vitals were not taken for this visit.   Wt Readings from Last 3 Encounters:  10/29/23 170 lb 6.4 oz (77.3 kg)  09/18/23 166 lb (75.3 kg)  08/01/23 167 lb 6.4 oz (75.9 kg)    Ocular: Sclerae unicteric, pupils equal, round and reactive to light Ear-nose-throat: Oropharynx clear, dentition fair Lymphatic: No cervical or supraclavicular adenopathy Lungs no rales or rhonchi, good excursion bilaterally Heart regular rate and rhythm, no murmur appreciated Abd soft, nontender, positive bowel sounds MSK no focal spinal tenderness, no joint edema Neuro: non-focal, well-oriented, appropriate affect Breasts: Deferred   Lab Results  Component Value Date   WBC 4.8 11/24/2023   HGB 11.7 (L) 11/24/2023   HCT 37.7  11/24/2023   MCV 77.3 (L) 11/24/2023   PLT 232 11/24/2023   Lab Results  Component Value Date   FERRITIN 6 (L) 10/29/2023   IRON 49 10/29/2023   TIBC 451 (H) 10/29/2023   UIBC 402 10/29/2023   IRONPCTSAT 11 10/29/2023   Lab Results  Component Value Date   RETICCTPCT 0.9 11/24/2023   RBC 4.88 11/24/2023   RBC 4.92 11/24/2023   RETICCTABS 55.1 04/27/2015   No results found for: "KPAFRELGTCHN", "LAMBDASER", "KAPLAMBRATIO" No results found for: "IGGSERUM", "IGA", "IGMSERUM" No results found for: "TOTALPROTELP", "ALBUMINELP", "A1GS", "A2GS", "BETS", "BETA2SER", "GAMS", "MSPIKE", "SPEI"   Chemistry      Component Value Date/Time   NA 141 10/29/2023 1313   NA 147 (H) 10/08/2017 1410   NA 141 02/17/2017 1132   K 4.1 10/29/2023 1313   K 4.6 10/08/2017 1410   K 5.3 (H) 02/17/2017 1132   CL 105 10/29/2023 1313   CL 106 10/08/2017 1410   CO2 29 10/29/2023 1313   CO2 30 10/08/2017 1410   CO2 29 02/17/2017 1132   BUN 21 10/29/2023 1313   BUN 15 10/08/2017 1410   BUN 17.8 02/17/2017 1132   CREATININE 0.74 10/29/2023 1313   CREATININE 1.0 10/08/2017 1410   CREATININE 0.8 02/17/2017 1132      Component Value Date/Time   CALCIUM 9.2 10/29/2023 1313   CALCIUM 9.0 10/08/2017 1410   CALCIUM 9.3 02/17/2017 1132   ALKPHOS 55 10/29/2023 1313   ALKPHOS 59 10/08/2017 1410   ALKPHOS 79 02/17/2017 1132   AST 20 10/29/2023 1313   AST 22 02/17/2017 1132   ALT 12 10/29/2023 1313   ALT 26 10/08/2017 1410   ALT 20 02/17/2017 1132   BILITOT 0.3 10/29/2023 1313   BILITOT 0.39 02/17/2017 1132       Impression and Plan: Ms. Veasley is a very pleasant 72 yo caucasian female with secondary polycythemia (JAK-2 negative) due to COPD.  No phlebotomy today, Hct 37.7% Iron studies pending.  Follow-up in 1 month.   Eileen Stanford, NP 12/23/20242:24 PM

## 2023-11-25 LAB — IRON AND IRON BINDING CAPACITY (CC-WL,HP ONLY)
Iron: 52 ug/dL (ref 28–170)
Saturation Ratios: 11 % (ref 10.4–31.8)
TIBC: 486 ug/dL — ABNORMAL HIGH (ref 250–450)
UIBC: 434 ug/dL (ref 148–442)

## 2023-12-19 LAB — LAB REPORT - SCANNED
A1c: 5.8
EGFR: 87
TSH: 2.29 (ref 0.41–5.90)

## 2023-12-24 ENCOUNTER — Telehealth: Payer: Self-pay

## 2023-12-24 ENCOUNTER — Encounter: Payer: Self-pay | Admitting: Gastroenterology

## 2023-12-24 ENCOUNTER — Ambulatory Visit: Payer: Medicare HMO | Admitting: Gastroenterology

## 2023-12-24 VITALS — BP 140/70 | HR 72 | Ht 63.0 in | Wt 173.2 lb

## 2023-12-24 DIAGNOSIS — D751 Secondary polycythemia: Secondary | ICD-10-CM

## 2023-12-24 DIAGNOSIS — K219 Gastro-esophageal reflux disease without esophagitis: Secondary | ICD-10-CM

## 2023-12-24 DIAGNOSIS — Z8619 Personal history of other infectious and parasitic diseases: Secondary | ICD-10-CM

## 2023-12-24 DIAGNOSIS — D45 Polycythemia vera: Secondary | ICD-10-CM

## 2023-12-24 DIAGNOSIS — R195 Other fecal abnormalities: Secondary | ICD-10-CM

## 2023-12-24 DIAGNOSIS — D509 Iron deficiency anemia, unspecified: Secondary | ICD-10-CM | POA: Diagnosis not present

## 2023-12-24 DIAGNOSIS — Z7902 Long term (current) use of antithrombotics/antiplatelets: Secondary | ICD-10-CM

## 2023-12-24 DIAGNOSIS — J9611 Chronic respiratory failure with hypoxia: Secondary | ICD-10-CM

## 2023-12-24 MED ORDER — SUFLAVE 178.7 G PO SOLR: 1.0000 | Freq: Once | ORAL | 0 refills | Status: AC

## 2023-12-24 NOTE — Progress Notes (Signed)
Chief Complaint:  Iron deficiency anemia, positive Cologuard   Referring Provider:     Dois Davenport, MD    HPI:     Natalie Hendrix is a 73 y.o. female with a history of CVA, asthma, COPD (on home O2 2.5 Liters), anxiety, depression, HTN, HLD, polycythemia 2/2 COPD, referred to the Gastroenterology Clinic for evaluation of iron deficiency anemia and positive Cologuard.  Rare BRB on tissue paper, only after straining to have BM, which is typically related to dietary indiscretions, and typically resolves with increased dietary fiber. Takes morphine ER 30 mg BID.  Otherwise, she is without GI symptoms.  No abdominal pain, nausea/vomiting.   Follows regularly in the Hematology Clinic, last seen 10/29/2023.  Undergoes phlebotomy to maintain hematocrit <38%.  Has a longstanding history of iron deficiency with mild anemia (baseline Hgb ~11.5) for over a decade, attributed to phlebotomies.  Currently on Plavix and oral iron.  Previously followed with Dr. Dulce Sellar at [facility].  Has not been seen in some years.  Reviewed most recent labs from 11/2023: - H/H 11.7/37.7 with MCV/RDW 77/18.5 - Ferritin 4, iron 52, TIBC 486, sat 11% - Normal B12 and folate in 10/2023  -08/22/2023: Cologuard positive.  Was previously negative in 01/2019.  Endoscopic History: - 11/19/1995: EGD: Normal - 12/03/2005: EGD: LA Grade B esophagitis, H. pylori gastritis - 12/03/2005: Colonoscopy: Normal - 10/27/2013: EGD: Normal esophagus, stomach, duodenum   Past Medical History:  Diagnosis Date   Allergic rhinitis    Allergy    Anxiety    Asthma    uses oxygen 2.5 l/m 24/7, sleeps elevated   Complication of anesthesia    very sensitive to sedatives, B/P drops   CVA (cerebral vascular accident) (HCC) 1999, 2014   residual left side weakness, dysphagia, word finding difficulty, and short term memory; uses mobile chair, cane   Dependence on supplemental oxygen    Depression    Dizziness     Fibromyalgia    Fluttering heart    GERD (gastroesophageal reflux disease)    Hyperlipidemia    Hypertension    Migraine headache    Migraine, unspecified, not intractable, without status migrainosus    Other amnesia    Polycythemia rubra vera (HCC)    Radiculopathy, site unspecified    Thoracic outlet syndrome 1981   repair   TIA (transient ischemic attack)    Unspecified chronic bronchitis (HCC)    Unspecified dementia without behavioral disturbance    Vertigo      Past Surgical History:  Procedure Laterality Date   APPENDECTOMY     BALLOON DILATION N/A 10/27/2013   Procedure: BALLOON DILATION;  Surgeon: Willis Modena, MD;  Location: [facility];  Service: Endoscopy;  Laterality: N/A;   BREAST SURGERY  1979   CHOLECYSTECTOMY     CYST REMOVAL HAND Bilateral    left done 10-13-13(Dr. Gramig)   DILATION AND CURETTAGE OF UTERUS     ESOPHAGOGASTRODUODENOSCOPY (EGD) WITH PROPOFOL N/A 10/27/2013   Procedure: ESOPHAGOGASTRODUODENOSCOPY (EGD) WITH PROPOFOL;  Surgeon: Willis Modena, MD;  Location: [facility];  Service: Endoscopy;  Laterality: N/A;   GALLBLADDER SURGERY     KNEE SURGERY     repair torn ligament/capsule knee cruciat   REDUCTION MAMMAPLASTY     RESECTION RIB PARTIAL     RHINOPLASTY     Root Resection and Revascularization  1980   of Long thoracic artery   scalenectomy  synonectomy     TRANSTHORACIC ECHOCARDIOGRAM  12/2019   Normal LV function.  EF 66 5%.  Grade 1 diastolic dysfunction.  Normal echocardiogram for age   Family History  Problem Relation Age of Onset   Cervical cancer Mother    Heart disease Mother    Hypertension Mother    Clotting disorder Mother        and aunts x 2   Irregular heart beat Mother    Emphysema Father    Hypertension Father    Lung cancer Father        cause of death.   Skin cancer Father    Heart disease Father    Hypertension Maternal Grandmother    Rheum arthritis Paternal Grandmother    Stroke Other         aunt   Hypertension Other        aunt   Stroke Other        uncle   Heart attack Other        uncle & Aunt   Emphysema Other        aunt   Allergies Other        aunt and sister   Other Other        polycythemia possible in 2 aunts   Other Other        aunt with brain tumor   Social History   Tobacco Use   Smoking status: Never   Smokeless tobacco: Never   Tobacco comments:    never used tobacco; secondary smoke exposure  Vaping Use   Vaping status: Never Used  Substance Use Topics   Alcohol use: Not Currently    Alcohol/week: 0.0 standard drinks of alcohol    Comment: seldom, wine @ christmas   Drug use: No   Current Outpatient Medications  Medication Sig Dispense Refill   albuterol (PROAIR HFA) 108 (90 Base) MCG/ACT inhaler Inhale 2 puffs into the lungs every 6 (six) hours as needed for wheezing or shortness of breath. 24 g 3   albuterol (PROVENTIL) (2.5 MG/3ML) 0.083% nebulizer solution Take 3 mLs (2.5 mg total) by nebulization every 6 (six) hours as needed for wheezing or shortness of breath. 1080 mL 3   azelastine (ASTELIN) 0.1 % nasal spray Place 1 spray into both nostrils 2 (two) times daily. 90 mL 3   beclomethasone (BECONASE-AQ) 42 MCG/SPRAY nasal spray 2 puffs each nostril once daily 75 g 3   benzonatate (TESSALON PERLES) 100 MG capsule Take 1 capsule (100 mg total) by mouth every 6 (six) hours as needed for cough. 30 capsule 5   buPROPion (WELLBUTRIN XL) 300 MG 24 hr tablet Take 1 tablet (300 mg total) by mouth daily with breakfast. 90 tablet 3   Calcium Carbonate-Vit D-Min (CALCIUM 1200 PO) Take 1,250 mg by mouth daily.      clopidogrel (PLAVIX) 75 MG tablet Take 1 tablet (75 mg total) by mouth daily. 90 tablet 1   cyclobenzaprine (FLEXERIL) 10 MG tablet Take 10 mg by mouth every 8 (eight) hours as needed.     ESTRACE VAGINAL 0.1 MG/GM vaginal cream Place 1 Applicatorful vaginally daily. Small amount each dose per pt     ezetimibe (ZETIA) 10 MG tablet TAKE 1  TABLET BY MOUTH DAILY. 90 tablet 3   fexofenadine (ALLEGRA ALLERGY) 180 MG tablet Take 1 tablet (180 mg total) by mouth daily. 90 tablet 1   hydrOXYzine (ATARAX/VISTARIL) 10 MG tablet Take 10 mg by mouth 3 (three) times  daily as needed.     hyoscyamine (ANASPAZ) 0.125 MG TBDP disintergrating tablet Place 1 tablet (0.125 mg total) under the tongue 2 (two) times daily as needed. (Patient taking differently: Place 0.125 mg under the tongue 2 (two) times daily as needed for bladder spasms or cramping.) 180 tablet 0   lidocaine (LIDODERM) 5 % Place 1 patch onto the skin as needed (pain). Remove & Discard patch within 12 hours or as directed by MD     LORazepam (ATIVAN) 1 MG tablet Take 1 tablet (1 mg total) by mouth 2 (two) times daily. May take extra 3rd dose if needed (Patient taking differently: Take 1 mg by mouth daily as needed. May take extra 3rd dose if needed) 75 tablet 0   magnesium gluconate (MAGONATE) 500 MG tablet Take 500 mg by mouth daily as needed (constipation).     meclizine (ANTIVERT) 25 MG tablet Take 1 tablet (25 mg total) by mouth 3 (three) times daily as needed for dizziness. 3 month supply (Patient taking differently: Take 25 mg by mouth 3 (three) times daily. 3 month supply) 60 tablet 0   metoprolol succinate (TOPROL-XL) 50 MG 24 hr tablet Take 1 tablet (50 mg total) by mouth daily with breakfast. (Patient taking differently: Take 75 mg by mouth daily with breakfast.) 90 tablet 1   morphine (MS CONTIN) 30 MG 12 hr tablet Take 1 tablet by mouth every 12 (twelve) hours.     morphine (MSIR) 15 MG tablet Take 15 mg by mouth every 4 (four) hours as needed for severe pain.     Multiple Vitamins-Minerals (CENTRUM SILVER) tablet Take 1 tablet by mouth daily.     Omega-3 Fatty Acids (FISH OIL) 1200 MG CAPS Take 1,200 mg by mouth at bedtime.      OXYGEN Inhale 2.5 L/hr into the lungs continuous.     pantoprazole (PROTONIX) 40 MG tablet TAKE 1 TABLET (40 MG TOTAL) BY MOUTH DAILY. 90 tablet 3    Polyethyl Glycol-Propyl Glycol 0.4-0.3 % SOLN Apply 1 drop to eye daily as needed (dry eyes).      pravastatin (PRAVACHOL) 40 MG tablet Take 1 tablet (40 mg total) by mouth at bedtime. 90 tablet 3   promethazine (PHENERGAN) 25 MG tablet Take 25 mg by mouth every 8 (eight) hours as needed for nausea or vomiting.   1   Respiratory Therapy Supplies (NEBULIZER) DEVI Use as directed 1 each 0   ZOVIRAX 5 % Apply 1 application topically as needed (flair).      No current facility-administered medications for this visit.   Facility-Administered Medications Ordered in Other Visits  Medication Dose Route Frequency Provider Last Rate Last Admin   0.9 %  sodium chloride infusion  1,000 mL Intravenous Once Erenest Blank, NP       Allergies  Allergen Reactions   Epinephrine Palpitations    Raises heart rate    Flonase [Fluticasone] Palpitations   Protriptyline Hcl Other (See Comments)    Severe constipation   Tamiflu [Oseltamivir Phosphate] Other (See Comments)    "like I was having a stroke"   Tamiflu [Oseltamivir] Anaphylaxis   Tizanidine Other (See Comments)    panic   Gabapentin Other (See Comments)    arthralgia Other reaction(s): Arthralgia (Joint Pain)   Other Hives and Rash    boiron acteane   Qvar [Beclomethasone] Other (See Comments)   Spiriva [Tiotropium Bromide Monohydrate] Rash   Tiotropium Rash   Zonegran Other (See Comments)    Mood swings  Advair Diskus [Fluticasone-Salmeterol] Rash   Baclofen Rash   Chlorzoxazone Rash   Lamotrigine Rash   Levofloxacin Rash   Protriptyline Rash   Ropinirole Rash   Tiotropium Bromide Monohydrate Rash   Verapamil Rash     Review of Systems: All systems reviewed and negative except where noted in HPI.     Physical Exam:    Wt Readings from Last 3 Encounters:  12/24/23 173 lb 4 oz (78.6 kg)  11/24/23 167 lb (75.8 kg)  10/29/23 170 lb 6.4 oz (77.3 kg)    BP (!) 140/70 (BP Location: Left Arm, Patient Position: Sitting, Cuff  Size: Normal)   Pulse 72   Ht 5\' 3"  (1.6 m) Comment: height measured without shoes  Wt 173 lb 4 oz (78.6 kg)   BMI 30.69 kg/m  Constitutional:  Pleasant, in no acute distress.  Nasal cannula in place Psychiatric: Normal mood and affect. Behavior is normal. Cardiovascular: Normal rate, regular rhythm. No edema Pulmonary/chest: Effort normal and breath sounds normal.  Nasal cannula in place Abdominal: Soft, nondistended, nontender.   ASSESSMENT AND PLAN;   1) Positive Cologuard Discussed potential etiologies for positive Cologuard.  Plan for colonoscopy for further evaluation - Colonoscopy to be scheduled at [facility]  2) Iron deficiency anemia 3) Secondary polycythemia Longstanding history of iron deficiency anemia, 2/2 regular phlebotomies for polycythemia.  Aside from scant BRB with episodic constipation, no overt bleeding.  Discussed role/utility of further endoscopic evaluation, and she would like to proceed with EGD at the same time as colonoscopy to ensure no UGI pathology. - EGD to be scheduled at [facility] - Check CBC 2-3 days prior to procedures to ensure no preoperative hemoconcentration requiring phlebotomy before hand - Continue regular follow-up in the Hematology clinic later this week as scheduled  4) History of H. pylori gastritis - Evaluate at time of EGD as above with random and directed biopsies  5) COPD on home O2 6) History of CVA 7) Chronic antiplatelet therapy - Procedures to be scheduled at [facility] due to elevated periprocedural risks from underlying comorbidities - Hold Plavix 5 days before procedure - will instruct when and how to resume after procedure. Low but real risk of cardiovascular event such as heart attack, stroke, embolism, thrombosis or ischemia/infarct of other organs off Plavix explained and need to seek urgent help if this occurs. The patient consents to proceed. Will communicate by phone or EMR with patient's prescribing provider to  confirm that holding Plavix is reasonable in this case  8) GERD - Well-controlled on current therapy - Continue Protonix - Evaluate for erosive esophagitis at time of EGD as above  The indications, risks, and benefits of EGD and colonoscopy were explained to the patient in detail. Risks include but are not limited to bleeding, perforation, adverse reaction to medications, and cardiopulmonary compromise. Sequelae include but are not limited to the possibility of surgery, hospitalization, and mortality. The patient verbalized understanding and wished to proceed. All questions answered, referred to scheduler and bowel prep ordered. Further recommendations pending results of the exam.       Shellia Cleverly, DO, FACG  12/24/2023, 9:26 AM   CC: Dois Davenport, MD Arlan Organ, MD

## 2023-12-24 NOTE — Patient Instructions (Addendum)
_______________________________________________________  If your blood pressure at your visit was 140/90 or greater, please contact your primary care physician to follow up on this. _______________________________________________________  If you are age 73 or older, your body mass index should be between 23-30. Your Body mass index is 30.69 kg/m. If this is out of the aforementioned range listed, please consider follow up with your Primary Care Provider. ________________________________________________________  The [facility] providers would like to encourage you to use [facility] to communicate with providers for non-urgent requests or questions.  Due to long hold times on the telephone, sending your provider a message by [facility] may be a faster and more efficient way to get a response.  Please allow 48 business hours for a response.  Please remember that this is for non-urgent requests.  _______________________________________________________  Your provider has requested that you go to the basement level for lab work on 01-30-24. Press "B" on the elevator. The lab is located at the first door on the left as you exit the elevator.   You have been scheduled for an endoscopy and colonoscopy. Please follow the written instructions given to you at your visit today.  Please pick up your prep supplies at the pharmacy within the next 1-3 days.  If you use inhalers (even only as needed), please bring them with you on the day of your procedure.  DO NOT TAKE 7 DAYS PRIOR TO TEST- Trulicity (dulaglutide) Ozempic, Wegovy (semaglutide) Mounjaro (tirzepatide) Bydureon Bcise (exanatide extended release)  DO NOT TAKE 1 DAY PRIOR TO YOUR TEST Rybelsus (semaglutide) Adlyxin (lixisenatide) Victoza (liraglutide) Byetta (exanatide) ___________________________________________________________________________  Due to recent changes in healthcare laws, you may see the results of your imaging and laboratory  studies on MyChart before your provider has had a chance to review them.  We understand that in some cases there may be results that are confusing or concerning to you. Not all laboratory results come back in the same time frame and the provider may be waiting for multiple results in order to interpret others.  Please give [place] 48 hours in order for your provider to thoroughly review all the results before contacting the office for clarification of your results.   It was a pleasure to see you today!  Vito Cirigliano, D.O.

## 2023-12-24 NOTE — Telephone Encounter (Signed)
  Natalie Hendrix 23-Aug-1951 [id number]  12-24-23   Dear Dr Eileen Stanford, NP  We have scheduled the above named patient for an endoscopy/colonscopy procedure. Our records show that she is on antiplatelet therapy.  Please advise as to whether the patient may come off their therapy of Plavix 5 days prior to their procedure which is scheduled for 02-19-24.  Please route your response to Blondell Reveal, CMA or fax response to [phone].  Sincerely,    [facility]

## 2023-12-26 ENCOUNTER — Inpatient Hospital Stay: Payer: Medicare HMO

## 2023-12-26 ENCOUNTER — Encounter: Payer: Self-pay | Admitting: Family

## 2023-12-26 ENCOUNTER — Inpatient Hospital Stay: Payer: Medicare HMO | Attending: Hematology & Oncology | Admitting: Family

## 2023-12-26 VITALS — BP 113/75 | HR 78 | Temp 98.2°F | Resp 20 | Ht 63.0 in | Wt 172.0 lb

## 2023-12-26 VITALS — BP 123/53 | HR 78 | Temp 98.2°F | Resp 18

## 2023-12-26 DIAGNOSIS — D751 Secondary polycythemia: Secondary | ICD-10-CM

## 2023-12-26 DIAGNOSIS — D45 Polycythemia vera: Secondary | ICD-10-CM

## 2023-12-26 DIAGNOSIS — Z5189 Encounter for other specified aftercare: Secondary | ICD-10-CM | POA: Insufficient documentation

## 2023-12-26 DIAGNOSIS — D500 Iron deficiency anemia secondary to blood loss (chronic): Secondary | ICD-10-CM | POA: Diagnosis not present

## 2023-12-26 MED ORDER — SODIUM CHLORIDE 0.9 % IV SOLN: Freq: Once | INTRAVENOUS | Status: AC

## 2023-12-26 NOTE — Progress Notes (Signed)
Pt.saw Eileen Stanford APP today. Returned to Infusion room for 250unit of Phlebotomy and of IVF post. Therapeutic phlebotomy performed using 20g IV and 250 units removed. Pt.tolerated procedure well today.

## 2023-12-26 NOTE — Patient Instructions (Signed)
[facility] - A DEPT OF [facility]  Discharge Instructions: Thank you for choosing [facility] to provide your oncology and hematology care.   If you have a lab appointment with the Cancer Center, please go directly to the Cancer Center and check in at the registration area.  Wear comfortable clothing and clothing appropriate for easy access to any Portacath or PICC line.   We strive to give you quality time with your provider. You may need to reschedule your appointment if you arrive late (15 or more minutes).  Arriving late affects you and other patients whose appointments are after yours.  Also, if you miss three or more appointments without notifying the office, you may be dismissed from the clinic at the provider's discretion.      For prescription refill requests, have your pharmacy contact our office and allow 72 hours for refills to be completed.    Today you received the following procedure and agent: Therapeutic phlebotomy/ hydration       To help prevent nausea and vomiting after your treatment, we encourage you to take your nausea medication as directed.  BELOW ARE SYMPTOMS THAT SHOULD BE REPORTED IMMEDIATELY: *FEVER GREATER THAN 100.4 F (38 C) OR HIGHER *CHILLS OR SWEATING *NAUSEA AND VOMITING THAT IS NOT CONTROLLED WITH YOUR NAUSEA MEDICATION *UNUSUAL SHORTNESS OF BREATH *UNUSUAL BRUISING OR BLEEDING *URINARY PROBLEMS (pain or burning when urinating, or frequent urination) *BOWEL PROBLEMS (unusual diarrhea, constipation, pain near the anus) TENDERNESS IN MOUTH AND THROAT WITH OR WITHOUT PRESENCE OF ULCERS (sore throat, sores in mouth, or a toothache) UNUSUAL RASH, SWELLING OR PAIN  UNUSUAL VAGINAL DISCHARGE OR ITCHING   Items with * indicate a potential emergency and should be followed up as soon as possible or go to the Emergency Department if any problems should occur.  Please show the CHEMOTHERAPY ALERT CARD or  IMMUNOTHERAPY ALERT CARD at check-in to the Emergency Department and triage nurse. Should you have questions after your visit or need to cancel or reschedule your appointment, please contact [facility] - A DEPT OF [facility]  [phone] and follow the prompts.  Office hours are 8:00 a.m. to 4:30 p.m. Monday - Friday. Please note that voicemails left after 4:00 p.m. may not be returned until the following business day.  We are closed weekends and major holidays. You have access to a nurse at all times for urgent questions. Please call the main number to the clinic [phone] and follow the prompts.  For any non-urgent questions, you may also contact your provider using MyChart. We now offer e-Visits for anyone 22 and older to request care online for non-urgent symptoms. For details visit [email or web address].   Also download the MyChart app! Go to the app store, search "MyChart", open the app, select [facility], and log in with your MyChart username and password.

## 2023-12-26 NOTE — Progress Notes (Signed)
Hematology and Oncology Follow Up Visit  Natalie Hendrix [id number] 28-Jan-1951 73 y.o. 12/26/2023   Principle Diagnosis:  Secondary polycythemia - JAK2 negative IDA- felt to be secondary to potential GI bleed- positive Cologuard    Current Therapy:        Phlebotomy to maintain hematocrit less than 38% Plavix 75 mg by mouth daily   Interim History:  [name] is here today for follow-up and phlebotomy. She had labs done with her PCP and Hct is 40.6%.  She notes fatigue as well as itching. Headaches have been a little more this week.  She has SOB and palpitations/chest discomfort with over exertion. She states that the chest pain is rare.  No fever, chills, n/v, cough, rash, abdominal pain or changes in bowel or bladder habits.   No swelling in her extremities.  She has sciatica down the legs intermittently.  No syncope. She did miss step and fall in her craft room last week. Thankfully she states that she was not seriously injured.  Appetite and hydration are good. Weight is stable at 172 lbs.   ECOG Performance Status: 1 - Symptomatic but completely ambulatory  Medications:  Allergies as of 12/26/2023       Reactions   Epinephrine Palpitations   Raises heart rate   Flonase [fluticasone] Palpitations   Protriptyline Hcl Other (See Comments)   Severe constipation   Tamiflu [oseltamivir Phosphate] Other (See Comments)   "like I was having a stroke"   Tamiflu [oseltamivir] Anaphylaxis   Tizanidine Other (See Comments)   panic   Gabapentin Other (See Comments)   arthralgia Other reaction(s): Arthralgia (Joint Pain)   Other Hives, Rash   boiron acteane   Qvar [beclomethasone] Other (See Comments)   Spiriva [tiotropium Bromide Monohydrate] Rash   Tiotropium Rash   Zonegran Other (See Comments)   Mood swings   Advair Diskus [fluticasone-salmeterol] Rash   Baclofen Rash   Chlorzoxazone Rash   Lamotrigine Rash   Levofloxacin Rash   Protriptyline Rash   Ropinirole Rash    Tiotropium Bromide Monohydrate Rash   Verapamil Rash        Medication List        Accurate as of December 26, 2023 11:28 AM. If you have any questions, ask your nurse or doctor.          albuterol (2.5 MG/3ML) 0.083% nebulizer solution Commonly known as: PROVENTIL Take 3 mLs (2.5 mg total) by nebulization every 6 (six) hours as needed for wheezing or shortness of breath.   albuterol 108 (90 Base) MCG/ACT inhaler Commonly known as: ProAir HFA Inhale 2 puffs into the lungs every 6 (six) hours as needed for wheezing or shortness of breath.   azelastine 0.1 % nasal spray Commonly known as: ASTELIN Place 1 spray into both nostrils 2 (two) times daily.   beclomethasone 42 MCG/SPRAY nasal spray Commonly known as: BECONASE-AQ 2 puffs each nostril once daily   benzonatate 100 MG capsule Commonly known as: Tessalon Perles Take 1 capsule (100 mg total) by mouth every 6 (six) hours as needed for cough.   buPROPion 300 MG 24 hr tablet Commonly known as: WELLBUTRIN XL Take 1 tablet (300 mg total) by mouth daily with breakfast.   CALCIUM 1200 PO Take 1,250 mg by mouth daily.   Centrum Silver tablet Take 1 tablet by mouth daily.   clopidogrel 75 MG tablet Commonly known as: PLAVIX Take 1 tablet (75 mg total) by mouth daily.   cyclobenzaprine 10 MG tablet Commonly  known as: FLEXERIL Take 10 mg by mouth every 8 (eight) hours as needed.   ESTRACE VAGINAL 0.1 MG/GM vaginal cream Generic drug: estradiol Place 1 Applicatorful vaginally daily. Small amount each dose per pt   ezetimibe 10 MG tablet Commonly known as: Zetia TAKE 1 TABLET BY MOUTH DAILY.   fexofenadine 180 MG tablet Commonly known as: Allegra Allergy Take 1 tablet (180 mg total) by mouth daily.   Fish Oil 1200 MG Caps Take 1,200 mg by mouth at bedtime.   hydrOXYzine 10 MG tablet Commonly known as: ATARAX Take 10 mg by mouth 3 (three) times daily as needed.   hyoscyamine 0.125 MG Tbdp disintergrating  tablet Commonly known as: ANASPAZ Place 1 tablet (0.125 mg total) under the tongue 2 (two) times daily as needed. What changed: reasons to take this   lidocaine 5 % Commonly known as: LIDODERM Place 1 patch onto the skin as needed (pain). Remove & Discard patch within 12 hours or as directed by MD   LORazepam 1 MG tablet Commonly known as: ATIVAN Take 1 tablet (1 mg total) by mouth 2 (two) times daily. May take extra 3rd dose if needed What changed:  when to take this reasons to take this   magnesium gluconate 500 MG tablet Commonly known as: MAGONATE Take 500 mg by mouth daily as needed (constipation).   meclizine 25 MG tablet Commonly known as: ANTIVERT Take 1 tablet (25 mg total) by mouth 3 (three) times daily as needed for dizziness. 3 month supply What changed: when to take this   metoprolol succinate 50 MG 24 hr tablet Commonly known as: TOPROL-XL Take 1 tablet (50 mg total) by mouth daily with breakfast. What changed: how much to take   morphine 15 MG tablet Commonly known as: MSIR Take 15 mg by mouth every 4 (four) hours as needed for severe pain.   morphine 30 MG 12 hr tablet Commonly known as: MS CONTIN Take 1 tablet by mouth every 12 (twelve) hours.   Nebulizer Devi Use as directed   OXYGEN Inhale 2.5 L/hr into the lungs continuous.   pantoprazole 40 MG tablet Commonly known as: PROTONIX TAKE 1 TABLET (40 MG TOTAL) BY MOUTH DAILY.   Polyethyl Glycol-Propyl Glycol 0.4-0.3 % Soln Apply 1 drop to eye daily as needed (dry eyes).   pravastatin 40 MG tablet Commonly known as: PRAVACHOL Take 1 tablet (40 mg total) by mouth at bedtime.   promethazine 25 MG tablet Commonly known as: PHENERGAN Take 25 mg by mouth every 8 (eight) hours as needed for nausea or vomiting.   Zovirax 5 % Generic drug: acyclovir ointment Apply 1 application topically as needed (flair).        Allergies:  Allergies  Allergen Reactions   Epinephrine Palpitations     Raises heart rate    Flonase [Fluticasone] Palpitations   Protriptyline Hcl Other (See Comments)    Severe constipation   Tamiflu [Oseltamivir Phosphate] Other (See Comments)    "like I was having a stroke"   Tamiflu [Oseltamivir] Anaphylaxis   Tizanidine Other (See Comments)    panic   Gabapentin Other (See Comments)    arthralgia Other reaction(s): Arthralgia (Joint Pain)   Other Hives and Rash    boiron acteane   Qvar [Beclomethasone] Other (See Comments)   Spiriva [Tiotropium Bromide Monohydrate] Rash   Tiotropium Rash   Zonegran Other (See Comments)    Mood swings   Advair Diskus [Fluticasone-Salmeterol] Rash   Baclofen Rash   Chlorzoxazone Rash  Lamotrigine Rash   Levofloxacin Rash   Protriptyline Rash   Ropinirole Rash   Tiotropium Bromide Monohydrate Rash   Verapamil Rash    Past Medical History, Surgical history, Social history, and Family History were reviewed and updated.  Review of Systems: All other 10 point review of systems is negative.   Physical Exam:  height is 5\' 3"  (1.6 m) and weight is 172 lb (78 kg). Her oral temperature is 98.2 F (36.8 C). Her blood pressure is 113/75 and her pulse is 78. Her respiration is 20 and oxygen saturation is 98%.   Wt Readings from Last 3 Encounters:  12/26/23 172 lb (78 kg)  12/24/23 173 lb 4 oz (78.6 kg)  11/24/23 167 lb (75.8 kg)    Ocular: Sclerae unicteric, pupils equal, round and reactive to light Ear-nose-throat: Oropharynx clear, dentition fair Lymphatic: No cervical or supraclavicular adenopathy Lungs no rales or rhonchi, good excursion bilaterally Heart regular rate and rhythm, no murmur appreciated Abd soft, nontender, positive bowel sounds MSK no focal spinal tenderness, no joint edema Neuro: non-focal, well-oriented, appropriate affect Breasts: Deferred   Lab Results  Component Value Date   WBC 4.8 11/24/2023   HGB 11.7 (L) 11/24/2023   HCT 37.7 11/24/2023   MCV 77.3 (L) 11/24/2023   PLT 232  11/24/2023   Lab Results  Component Value Date   FERRITIN 4 (L) 11/24/2023   IRON 52 11/24/2023   TIBC 486 (H) 11/24/2023   UIBC 434 11/24/2023   IRONPCTSAT 11 11/24/2023   Lab Results  Component Value Date   RETICCTPCT 0.9 11/24/2023   RBC 4.88 11/24/2023   RBC 4.92 11/24/2023   RETICCTABS 55.1 04/27/2015   No results found for: "KPAFRELGTCHN", "LAMBDASER", "KAPLAMBRATIO" No results found for: "IGGSERUM", "IGA", "IGMSERUM" No results found for: "TOTALPROTELP", "ALBUMINELP", "A1GS", "A2GS", "BETS", "BETA2SER", "GAMS", "MSPIKE", "SPEI"   Chemistry      Component Value Date/Time   NA 142 11/24/2023 1405   NA 147 (H) 10/08/2017 1410   NA 141 02/17/2017 1132   K 4.2 11/24/2023 1405   K 4.6 10/08/2017 1410   K 5.3 (H) 02/17/2017 1132   CL 107 11/24/2023 1405   CL 106 10/08/2017 1410   CO2 30 11/24/2023 1405   CO2 30 10/08/2017 1410   CO2 29 02/17/2017 1132   BUN 15 11/24/2023 1405   BUN 15 10/08/2017 1410   BUN 17.8 02/17/2017 1132   CREATININE 0.77 11/24/2023 1405   CREATININE 1.0 10/08/2017 1410   CREATININE 0.8 02/17/2017 1132      Component Value Date/Time   CALCIUM 9.1 11/24/2023 1405   CALCIUM 9.0 10/08/2017 1410   CALCIUM 9.3 02/17/2017 1132   ALKPHOS 57 11/24/2023 1405   ALKPHOS 59 10/08/2017 1410   ALKPHOS 79 02/17/2017 1132   AST 18 11/24/2023 1405   AST 22 02/17/2017 1132   ALT 15 11/24/2023 1405   ALT 26 10/08/2017 1410   ALT 20 02/17/2017 1132   BILITOT 0.5 11/24/2023 1405   BILITOT 0.39 02/17/2017 1132       Impression and Plan: Ms. Rabadan is a very pleasant 73 yo caucasian female with secondary polycythemia (JAK-2 negative) due to COPD.  Partial phlebotomy (250 ml out) and 250 ml normal saline replacement after for Hct 40.6%.   Follow-up in 1 month.   Eileen Stanford, NP 1/24/202511:28 AM

## 2024-01-12 NOTE — Telephone Encounter (Signed)
 Patient advised that she has been given clearance to hold Plavix  5 days prior to endo/colon scheduled for 02-19-24.  Patient advised to take last dose of Plavix  on 02-13-24, and she will be advised when to restart Plavix  by Dr Karene Oto after the procedure.  Patient agreed to plan and verbalized understanding.  No further questions.

## 2024-01-22 ENCOUNTER — Telehealth: Payer: Self-pay | Admitting: Gastroenterology

## 2024-01-22 NOTE — Telephone Encounter (Signed)
 Inbound call from patient stating she would like to speak with the nurse regarding her upcoming procedure with Dr. Barron Alvine for 3/20. She states she has some questions regarding the procedure and may need to postpone. Please advise.

## 2024-01-22 NOTE — Telephone Encounter (Signed)
 Patient calls stating that she may need to reschedule her procedure due to multiple concerns including the inability to care for her husband who is post CVA x 3, being unable to "juggle" the prep for procedure. She also goes on to describe that her whole body is inflamed and she does not want Dr Barron Alvine to get another "false positive" test because of the inflammation. I did explain that we were doing the colonoscopy due to IDA and previous positive cologuard which checks for abnormal DNA caused by polyps, cancers, etc and we are also completing endoscopy due to IDA. Ultimately, patient decided to keep her scheduled endoscopy/colonoscopy appointment.  She did ask for [place] to resend prep instructions to her which I have sent via [employer] and Mychart. She is also advised that she has been given clearance to hold Plavix 5 days prior to the 02/19/24 procedure. She verbalizes understanding.

## 2024-01-23 ENCOUNTER — Encounter: Payer: Self-pay | Admitting: Hematology & Oncology

## 2024-01-23 ENCOUNTER — Inpatient Hospital Stay: Payer: Medicare HMO

## 2024-01-23 ENCOUNTER — Other Ambulatory Visit: Payer: Self-pay

## 2024-01-23 ENCOUNTER — Inpatient Hospital Stay (HOSPITAL_BASED_OUTPATIENT_CLINIC_OR_DEPARTMENT_OTHER): Payer: Medicare HMO | Admitting: Hematology & Oncology

## 2024-01-23 ENCOUNTER — Inpatient Hospital Stay: Payer: Medicare HMO | Attending: Hematology & Oncology

## 2024-01-23 VITALS — BP 128/66 | HR 98 | Temp 98.8°F | Resp 19 | Ht 63.0 in | Wt 169.0 lb

## 2024-01-23 VITALS — BP 115/66 | HR 80

## 2024-01-23 DIAGNOSIS — D751 Secondary polycythemia: Secondary | ICD-10-CM | POA: Diagnosis present

## 2024-01-23 DIAGNOSIS — I10 Essential (primary) hypertension: Secondary | ICD-10-CM | POA: Diagnosis not present

## 2024-01-23 DIAGNOSIS — D45 Polycythemia vera: Secondary | ICD-10-CM

## 2024-01-23 DIAGNOSIS — D500 Iron deficiency anemia secondary to blood loss (chronic): Secondary | ICD-10-CM

## 2024-01-23 DIAGNOSIS — E782 Mixed hyperlipidemia: Secondary | ICD-10-CM | POA: Diagnosis not present

## 2024-01-23 DIAGNOSIS — D509 Iron deficiency anemia, unspecified: Secondary | ICD-10-CM | POA: Diagnosis not present

## 2024-01-23 LAB — CMP (CANCER CENTER ONLY)
ALT: 15 U/L (ref 0–44)
AST: 19 U/L (ref 15–41)
Albumin: 3.9 g/dL (ref 3.5–5.0)
Alkaline Phosphatase: 54 U/L (ref 38–126)
Anion gap: 6 (ref 5–15)
BUN: 18 mg/dL (ref 8–23)
CO2: 30 mmol/L (ref 22–32)
Calcium: 9.6 mg/dL (ref 8.9–10.3)
Chloride: 105 mmol/L (ref 98–111)
Creatinine: 0.7 mg/dL (ref 0.44–1.00)
GFR, Estimated: >60 mL/min (ref >60)
Glucose, Bld: 106 mg/dL — ABNORMAL HIGH (ref 70–99)
Potassium: 4.6 mmol/L (ref 3.5–5.1)
Sodium: 141 mmol/L (ref 135–145)
Total Bilirubin: 0.5 mg/dL (ref 0.0–1.2)
Total Protein: 6.1 g/dL — ABNORMAL LOW (ref 6.5–8.1)

## 2024-01-23 LAB — RETICULOCYTES
Immature Retic Fract: 8 % (ref 2.3–15.9)
RBC.: 4.92 MIL/uL (ref 3.87–5.11)
Retic Count, Absolute: 51.2 10*3/uL (ref 19.0–186.0)
Retic Ct Pct: 1 % (ref 0.4–3.1)

## 2024-01-23 LAB — IRON AND IRON BINDING CAPACITY (CC-WL,HP ONLY)
Iron: 46 ug/dL (ref 28–170)
Saturation Ratios: 9 % — ABNORMAL LOW (ref 10.4–31.8)
TIBC: 489 ug/dL — ABNORMAL HIGH (ref 250–450)
UIBC: 443 ug/dL — ABNORMAL HIGH (ref 148–442)

## 2024-01-23 LAB — CBC WITH DIFFERENTIAL (CANCER CENTER ONLY)
Abs Immature Granulocytes: 0.01 10*3/uL (ref 0.00–0.07)
Basophils Absolute: 0 10*3/uL (ref 0.0–0.1)
Basophils Relative: 1 %
Eosinophils Absolute: 0.1 10*3/uL (ref 0.0–0.5)
Eosinophils Relative: 2 %
HCT: 39.5 % (ref 36.0–46.0)
Hemoglobin: 12.4 g/dL (ref 12.0–15.0)
Immature Granulocytes: 0 %
Lymphocytes Relative: 23 %
Lymphs Abs: 1.3 10*3/uL (ref 0.7–4.0)
MCH: 25.2 pg — ABNORMAL LOW (ref 26.0–34.0)
MCHC: 31.4 g/dL (ref 30.0–36.0)
MCV: 80.1 fL (ref 80.0–100.0)
Monocytes Absolute: 0.5 10*3/uL (ref 0.1–1.0)
Monocytes Relative: 9 %
Neutro Abs: 3.6 10*3/uL (ref 1.7–7.7)
Neutrophils Relative %: 65 %
Platelet Count: 215 10*3/uL (ref 150–400)
RBC: 4.93 MIL/uL (ref 3.87–5.11)
RDW: 16 % — ABNORMAL HIGH (ref 11.5–15.5)
WBC Count: 5.6 10*3/uL (ref 4.0–10.5)
nRBC: 0 % (ref 0.0–0.2)

## 2024-01-23 LAB — FERRITIN: Ferritin: 6 ng/mL — ABNORMAL LOW (ref 11–307)

## 2024-01-23 MED ORDER — SODIUM CHLORIDE 0.9 % IV SOLN: Freq: Once | INTRAVENOUS | Status: AC

## 2024-01-23 NOTE — Patient Instructions (Signed)

## 2024-01-23 NOTE — Progress Notes (Signed)
 Natalie Hendrix presents today for phlebotomy per MD orders. Phlebotomy procedure started at 1245 and ended at 1252 500 grams removed via 20 gauge needle to right forearm.  Patient observed for 30 minutes after procedure without any incident. Patient tolerated procedure well and received replacement fluids after procedure.  Patient understands to call if he has any questions or concerns post discharge.

## 2024-01-23 NOTE — Progress Notes (Signed)
 Hematology and Oncology Follow Up Visit  Natalie Hendrix [id number] Aug 01, 1951 73 y.o. 01/23/2024   Principle Diagnosis:  Secondary polycythemia - JAK2 negative IDA- felt to be secondary to potential GI bleed- positive Cologuard    Current Therapy:        Phlebotomy to maintain hematocrit less than 38% Plavix 75 mg by mouth daily   Interim History:  Natalie Hendrix is here today for follow-up and phlebotomy.  Unfortunately, she is going to have a colonoscopy and upper endoscopy in late March.  She tested positive on a Cologuard test.  I told her that this is critical that she have this done.  I want to make sure that she does not have colon cancer.  Her hematocrit is 39.1%.  We will go ahead and phlebotomize her today.    Her electrolytes all look pretty good today.  She has had no problems with headache.  She has had no problems with nausea or vomiting.  She has had no cough or shortness of breath.    ECOG Performance Status: 1 - Symptomatic but completely ambulatory  Medications:  Allergies as of 01/23/2024       Reactions   Epinephrine Palpitations   Raises heart rate   Flonase [fluticasone] Palpitations   Protriptyline Hcl Other (See Comments)   Severe constipation   Tamiflu [oseltamivir Phosphate] Other (See Comments)   "like I was having a stroke"   Tamiflu [oseltamivir] Anaphylaxis   Tizanidine Other (See Comments)   panic   Gabapentin Other (See Comments)   arthralgia Other reaction(s): Arthralgia (Joint Pain)   Other Hives, Rash   boiron acteane   Qvar [beclomethasone] Other (See Comments)   Spiriva [tiotropium Bromide Monohydrate] Rash   Tiotropium Rash   Zonegran Other (See Comments)   Mood swings   Advair Diskus [fluticasone-salmeterol] Rash   Baclofen Rash   Chlorzoxazone Rash   Lamotrigine Rash   Levofloxacin Rash   Protriptyline Rash   Ropinirole Rash   Tiotropium Bromide Monohydrate Rash   Verapamil Rash        Medication List        Accurate  as of January 23, 2024 11:32 AM. If you have any questions, ask your nurse or doctor.          albuterol (2.5 MG/3ML) 0.083% nebulizer solution Commonly known as: PROVENTIL Take 3 mLs (2.5 mg total) by nebulization every 6 (six) hours as needed for wheezing or shortness of breath.   albuterol 108 (90 Base) MCG/ACT inhaler Commonly known as: ProAir HFA Inhale 2 puffs into the lungs every 6 (six) hours as needed for wheezing or shortness of breath.   azelastine 0.1 % nasal spray Commonly known as: ASTELIN Place 1 spray into both nostrils 2 (two) times daily.   beclomethasone 42 MCG/SPRAY nasal spray Commonly known as: BECONASE-AQ 2 puffs each nostril once daily   benzonatate 100 MG capsule Commonly known as: Tessalon Perles Take 1 capsule (100 mg total) by mouth every 6 (six) hours as needed for cough.   buPROPion 300 MG 24 hr tablet Commonly known as: WELLBUTRIN XL Take 1 tablet (300 mg total) by mouth daily with breakfast.   CALCIUM 1200 PO Take 1,250 mg by mouth daily.   Centrum Silver tablet Take 1 tablet by mouth daily.   clopidogrel 75 MG tablet Commonly known as: PLAVIX Take 1 tablet (75 mg total) by mouth daily.   cyclobenzaprine 10 MG tablet Commonly known as: FLEXERIL Take 10 mg by mouth every 8 (  eight) hours as needed.   ESTRACE VAGINAL 0.1 MG/GM vaginal cream Generic drug: estradiol Place 1 Applicatorful vaginally daily. Small amount each dose per pt   ezetimibe 10 MG tablet Commonly known as: Zetia TAKE 1 TABLET BY MOUTH DAILY.   fexofenadine 180 MG tablet Commonly known as: Allegra Allergy Take 1 tablet (180 mg total) by mouth daily.   Fish Oil 1200 MG Caps Take 1,200 mg by mouth at bedtime.   hydrOXYzine 10 MG tablet Commonly known as: ATARAX Take 10 mg by mouth 3 (three) times daily as needed.   hyoscyamine 0.125 MG Tbdp disintergrating tablet Commonly known as: ANASPAZ Place 1 tablet (0.125 mg total) under the tongue 2 (two) times daily  as needed. What changed: reasons to take this   lidocaine 5 % Commonly known as: LIDODERM Place 1 patch onto the skin as needed (pain). Remove & Discard patch within 12 hours or as directed by MD   LORazepam 1 MG tablet Commonly known as: ATIVAN Take 1 tablet (1 mg total) by mouth 2 (two) times daily. May take extra 3rd dose if needed What changed:  when to take this reasons to take this   magnesium gluconate 500 MG tablet Commonly known as: MAGONATE Take 500 mg by mouth daily as needed (constipation).   meclizine 25 MG tablet Commonly known as: ANTIVERT Take 1 tablet (25 mg total) by mouth 3 (three) times daily as needed for dizziness. 3 month supply What changed: when to take this   metoprolol succinate 50 MG 24 hr tablet Commonly known as: TOPROL-XL Take 1 tablet (50 mg total) by mouth daily with breakfast. What changed: how much to take   morphine 15 MG tablet Commonly known as: MSIR Take 15 mg by mouth every 4 (four) hours as needed for severe pain.   morphine 30 MG 12 hr tablet Commonly known as: MS CONTIN Take 1 tablet by mouth every 12 (twelve) hours.   Nebulizer Devi Use as directed   OXYGEN Inhale 2.5 L/hr into the lungs continuous.   pantoprazole 40 MG tablet Commonly known as: PROTONIX TAKE 1 TABLET (40 MG TOTAL) BY MOUTH DAILY.   Polyethyl Glycol-Propyl Glycol 0.4-0.3 % Soln Apply 1 drop to eye daily as needed (dry eyes).   pravastatin 40 MG tablet Commonly known as: PRAVACHOL Take 1 tablet (40 mg total) by mouth at bedtime.   promethazine 25 MG tablet Commonly known as: PHENERGAN Take 25 mg by mouth every 8 (eight) hours as needed for nausea or vomiting.   Zovirax 5 % Generic drug: acyclovir ointment Apply 1 application topically as needed (flair).        Allergies:  Allergies  Allergen Reactions   Epinephrine Palpitations    Raises heart rate    Flonase [Fluticasone] Palpitations   Protriptyline Hcl Other (See Comments)    Severe  constipation   Tamiflu [Oseltamivir Phosphate] Other (See Comments)    "like I was having a stroke"   Tamiflu [Oseltamivir] Anaphylaxis   Tizanidine Other (See Comments)    panic   Gabapentin Other (See Comments)    arthralgia Other reaction(s): Arthralgia (Joint Pain)   Other Hives and Rash    boiron acteane   Qvar [Beclomethasone] Other (See Comments)   Spiriva [Tiotropium Bromide Monohydrate] Rash   Tiotropium Rash   Zonegran Other (See Comments)    Mood swings   Advair Diskus [Fluticasone-Salmeterol] Rash   Baclofen Rash   Chlorzoxazone Rash   Lamotrigine Rash   Levofloxacin Rash  Protriptyline Rash   Ropinirole Rash   Tiotropium Bromide Monohydrate Rash   Verapamil Rash    Past Medical History, Surgical history, Social history, and Family History were reviewed and updated.  Review of Systems: All other 10 point review of systems is negative.   Physical Exam:  height is 5\' 3"  (1.6 m) and weight is 169 lb (76.7 kg). Her oral temperature is 98.8 F (37.1 C). Her blood pressure is 128/66 and her pulse is 98. Her respiration is 19 and oxygen saturation is 98%.   Wt Readings from Last 3 Encounters:  01/23/24 169 lb (76.7 kg)  12/26/23 172 lb (78 kg)  12/24/23 173 lb 4 oz (78.6 kg)    Ocular: Sclerae unicteric, pupils equal, round and reactive to light Ear-nose-throat: Oropharynx clear, dentition fair Lymphatic: No cervical or supraclavicular adenopathy Lungs no rales or rhonchi, good excursion bilaterally Heart regular rate and rhythm, no murmur appreciated Abd soft, nontender, positive bowel sounds MSK no focal spinal tenderness, no joint edema Neuro: non-focal, well-oriented, appropriate affect Breasts: Deferred   Lab Results  Component Value Date   WBC 5.6 01/23/2024   HGB 12.4 01/23/2024   HCT 39.5 01/23/2024   MCV 80.1 01/23/2024   PLT 215 01/23/2024   Lab Results  Component Value Date   FERRITIN 4 (L) 11/24/2023   IRON 52 11/24/2023   TIBC 486 (H)  11/24/2023   UIBC 434 11/24/2023   IRONPCTSAT 11 11/24/2023   Lab Results  Component Value Date   RETICCTPCT 1.0 01/23/2024   RBC 4.93 01/23/2024   RBC 4.92 01/23/2024   RETICCTABS 55.1 04/27/2015   No results found for: "KPAFRELGTCHN", "LAMBDASER", "KAPLAMBRATIO" No results found for: "IGGSERUM", "IGA", "IGMSERUM" No results found for: "TOTALPROTELP", "ALBUMINELP", "A1GS", "A2GS", "BETS", "BETA2SER", "GAMS", "MSPIKE", "SPEI"   Chemistry      Component Value Date/Time   NA 141 01/23/2024 1045   NA 147 (H) 10/08/2017 1410   NA 141 02/17/2017 1132   K 4.6 01/23/2024 1045   K 4.6 10/08/2017 1410   K 5.3 (H) 02/17/2017 1132   CL 105 01/23/2024 1045   CL 106 10/08/2017 1410   CO2 30 01/23/2024 1045   CO2 30 10/08/2017 1410   CO2 29 02/17/2017 1132   BUN 18 01/23/2024 1045   BUN 15 10/08/2017 1410   BUN 17.8 02/17/2017 1132   CREATININE 0.70 01/23/2024 1045   CREATININE 1.0 10/08/2017 1410   CREATININE 0.8 02/17/2017 1132      Component Value Date/Time   CALCIUM 9.6 01/23/2024 1045   CALCIUM 9.0 10/08/2017 1410   CALCIUM 9.3 02/17/2017 1132   ALKPHOS 54 01/23/2024 1045   ALKPHOS 59 10/08/2017 1410   ALKPHOS 79 02/17/2017 1132   AST 19 01/23/2024 1045   AST 22 02/17/2017 1132   ALT 15 01/23/2024 1045   ALT 26 10/08/2017 1410   ALT 20 02/17/2017 1132   BILITOT 0.5 01/23/2024 1045   BILITOT 0.39 02/17/2017 1132       Impression and Plan: Ms. Wolfert is a very pleasant 73 yo caucasian female with secondary polycythemia (JAK-2 negative) due to COPD.   Again, I do not see a problem with her having a colonoscopy or upper endoscopy.  I think this would be very important.  We will plan to have her come back to see [place] after she has her colonoscopy and upper endoscopy.    Josph Macho, MD 2/21/202511:32 AM
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
## 2024-01-29 ENCOUNTER — Ambulatory Visit: Payer: Medicare HMO | Admitting: Internal Medicine

## 2024-01-29 VITALS — BP 110/70 | HR 81 | Temp 97.9°F | Ht 63.0 in | Wt 174.0 lb

## 2024-01-29 DIAGNOSIS — J9611 Chronic respiratory failure with hypoxia: Secondary | ICD-10-CM

## 2024-01-29 DIAGNOSIS — J45909 Unspecified asthma, uncomplicated: Secondary | ICD-10-CM | POA: Diagnosis not present

## 2024-01-29 DIAGNOSIS — I4891 Unspecified atrial fibrillation: Secondary | ICD-10-CM

## 2024-01-29 DIAGNOSIS — R195 Other fecal abnormalities: Secondary | ICD-10-CM

## 2024-01-29 NOTE — Patient Instructions (Signed)
 You are clear from a pulmonary standpoint to get the colonoscopy done.  You will need to be watched carefully for your oxygen needs in Recovery until the sedatives wear out.  Please let us know if we can help.

## 2024-01-30 ENCOUNTER — Encounter: Payer: Self-pay | Admitting: Hematology & Oncology

## 2024-02-01 ENCOUNTER — Encounter: Payer: Self-pay | Admitting: Gastroenterology

## 2024-02-02 ENCOUNTER — Encounter: Payer: Self-pay | Admitting: Internal Medicine

## 2024-02-06 NOTE — Telephone Encounter (Signed)
 PT is calling to have a nurse speak with her about prep medicaiton. Please advise.

## 2024-02-08 NOTE — Progress Notes (Unsigned)
  Cardiology Office Note:    Date:  02/08/2024  ID:  SAIDA LONON, DOB 04/05/1951, MRN [id number] PCP: Dois Davenport, MD  [facility] Providers Cardiologist:  Bryan Lemma, MD { Click to update primary MD,subspecialty MD or APP then REFRESH:1}    {Click to Open Review  :1}   Patient Profile:      Chief Complaint: *** History of Present Illness:  Natalie Hendrix is a 73 y.o. female with visit-pertinent history of polycythemia vera (phlebotomy dependent), COPD on chronic oxygen, palpitations, hyperlipidemia  Previous cardiovascular history includes Myoview in 2011 that was normal.  Most recent echocardiogram in 2021 revealed LVEF 60 to 65%, borderline LVH, grade 1 DD, mild-moderate dilation of the left atrium, mild mitral valve regurgitation.  Last seen in clinic on 09/24/2022 by Lanora Manis, NP.  She was without any cardiovascular concerns or complaints.  She was without any palpitations.  She noted history of chronic dyspnea on exertion associated with COPD that was stable.  She was to follow-up in 1 year.  Palpitations  Hyperlipidemia  COPD  Polycythemia vera  Discussed the use of AI scribe software for clinical note transcription with the patient, who gave verbal consent to proceed.  History of Present Illness            Review of systems:  Please see the history of present illness. All other systems are reviewed and otherwise negative. ***     Home Medications:    No outpatient medications have been marked as taking for the 02/09/24 encounter (Appointment) with Denyce Robert, NP.   Studies Reviewed:       *** Risk Assessment/Calculations:   {Does this patient have ATRIAL FIBRILLATION?[phone]} No BP recorded.  {Refresh Note OR Click here to enter BP  :1}***       Physical Exam:   VS:  There were no vitals taken for this visit.   Wt Readings from Last 3 Encounters:  01/29/24 174 lb (78.9 kg)  01/23/24 169 lb (76.7 kg)  12/26/23 172 lb (78 kg)     GEN: Well nourished, well developed in no acute distress NECK: No JVD; No carotid bruits CARDIAC: ***RRR, no murmurs, rubs, gallops RESPIRATORY:  Clear to auscultation without rales, wheezing or rhonchi  ABDOMEN: Soft, non-tender, non-distended EXTREMITIES:  No edema; No acute deformity ***     Assessment and Plan:  Assessment and Plan              {Are you ordering a CV Procedure (e.g. stress test, cath, DCCV, TEE, etc)?   Press F2        :[id number]}  Dispo:  No follow-ups on file.  Signed, Denyce Robert, NP

## 2024-02-09 ENCOUNTER — Encounter: Payer: Self-pay | Admitting: Emergency Medicine

## 2024-02-09 ENCOUNTER — Ambulatory Visit: Payer: Medicare HMO | Attending: Emergency Medicine | Admitting: Emergency Medicine

## 2024-02-09 VITALS — BP 108/62 | HR 74 | Ht 63.0 in | Wt 171.0 lb

## 2024-02-09 DIAGNOSIS — E782 Mixed hyperlipidemia: Secondary | ICD-10-CM | POA: Diagnosis not present

## 2024-02-09 DIAGNOSIS — I340 Nonrheumatic mitral (valve) insufficiency: Secondary | ICD-10-CM

## 2024-02-09 DIAGNOSIS — I10 Essential (primary) hypertension: Secondary | ICD-10-CM | POA: Diagnosis not present

## 2024-02-09 DIAGNOSIS — J449 Chronic obstructive pulmonary disease, unspecified: Secondary | ICD-10-CM

## 2024-02-09 DIAGNOSIS — R002 Palpitations: Secondary | ICD-10-CM

## 2024-02-09 DIAGNOSIS — R000 Tachycardia, unspecified: Secondary | ICD-10-CM

## 2024-02-09 DIAGNOSIS — D45 Polycythemia vera: Secondary | ICD-10-CM

## 2024-02-09 NOTE — Patient Instructions (Signed)
 Medication Instructions:  NO CHANGES   Lab Work: NONE   Testing/Procedures: Your physician has requested that you have an ECHOCARDIOGRAM. Echocardiography is a painless test that uses sound waves to create images of your heart. It provides your doctor with information about the size and shape of your heart and how well your heart's chambers and valves are working. This procedure takes approximately one hour. There are no restrictions for this procedure. Please do NOT wear cologne, perfume, aftershave, or lotions (deodorant is allowed). Please arrive 15 minutes prior to your appointment time.  Please note: We ask at that you not bring children with you during ultrasound (echo/ vascular) testing. Due to room size and safety concerns, children are not allowed in the ultrasound rooms during exams. Our front office staff cannot provide observation of children in our lobby area while testing is being conducted. An adult accompanying a patient to their appointment will only be allowed in the ultrasound room at the discretion of the ultrasound technician under special circumstances. We apologize for any inconvenience.    Follow-Up: At [facility], you and your health needs are our priority.  As part of our continuing mission to provide you with exceptional heart care, we have created designated Provider Care Teams.  These Care Teams include your primary Cardiologist (physician) and Advanced Practice Providers (APPs -  Physician Assistants and Nurse Practitioners) who all work together to provide you with the care you need, when you need it.    Your next appointment:   6 MONTHS  Provider:   DR. HARDING OR [facility]   Other Instructions:

## 2024-02-11 ENCOUNTER — Telehealth: Payer: Self-pay | Admitting: Gastroenterology

## 2024-02-11 NOTE — Telephone Encounter (Signed)
Patient returned call, requesting a call back. Please advise. °

## 2024-02-11 NOTE — Telephone Encounter (Addendum)
 Procedure:Colon/Endo Procedure date: 02/19/24 Procedure location: [facility] Arrival Time: 7:00 am Spoke with the patient Y/N:  No, left a message with husband for pt to call me back 02/11/24 @ 11:34 am Yes 02/11/24 @ 3:49 pm  Any prep concerns? No  Has the patient obtained the prep from the pharmacy ? Yes Do you have a care partner and transportation: Yes Any additional concerns? No

## 2024-02-12 ENCOUNTER — Encounter (HOSPITAL_COMMUNITY): Payer: Self-pay | Admitting: Gastroenterology

## 2024-02-12 NOTE — Progress Notes (Signed)
 Attempted to obtain medical history for pre op call via telephone, unable to reach at this time. HIPAA compliant voicemail message left requesting return call to pre surgical testing department.

## 2024-02-16 ENCOUNTER — Encounter: Payer: Self-pay | Admitting: Hematology & Oncology

## 2024-02-16 ENCOUNTER — Other Ambulatory Visit (INDEPENDENT_AMBULATORY_CARE_PROVIDER_SITE_OTHER)

## 2024-02-16 DIAGNOSIS — R195 Other fecal abnormalities: Secondary | ICD-10-CM

## 2024-02-16 DIAGNOSIS — Z8619 Personal history of other infectious and parasitic diseases: Secondary | ICD-10-CM | POA: Diagnosis not present

## 2024-02-16 DIAGNOSIS — D509 Iron deficiency anemia, unspecified: Secondary | ICD-10-CM

## 2024-02-16 LAB — CBC
HCT: 35.8 % — ABNORMAL LOW (ref 36.0–46.0)
Hemoglobin: 11.4 g/dL — ABNORMAL LOW (ref 12.0–15.0)
MCHC: 32 g/dL (ref 30.0–36.0)
MCV: 76.8 fl — ABNORMAL LOW (ref 78.0–100.0)
Platelets: 245 10*3/uL (ref 150.0–400.0)
RBC: 4.66 Mil/uL (ref 3.87–5.11)
RDW: 15.5 % (ref 11.5–15.5)
WBC: 5.9 10*3/uL (ref 4.0–10.5)

## 2024-02-17 ENCOUNTER — Encounter: Payer: Self-pay | Admitting: Gastroenterology

## 2024-02-19 ENCOUNTER — Other Ambulatory Visit: Payer: Self-pay

## 2024-02-19 ENCOUNTER — Encounter (HOSPITAL_COMMUNITY)
Admission: RE | Disposition: A | Discharge status: Home / Self Care | Payer: Self-pay | Source: Home / Self Care | Attending: Gastroenterology

## 2024-02-19 ENCOUNTER — Ambulatory Visit (HOSPITAL_COMMUNITY)
Admission: RE | Admit: 2024-02-19 | Discharge: 2024-02-19 | Disposition: A | Discharge status: Home / Self Care | Payer: Medicare HMO | Attending: Gastroenterology | Admitting: Gastroenterology

## 2024-02-19 ENCOUNTER — Ambulatory Visit (HOSPITAL_COMMUNITY): Payer: Self-pay | Admitting: Certified Registered"

## 2024-02-19 ENCOUNTER — Encounter (HOSPITAL_COMMUNITY): Payer: Self-pay | Admitting: Gastroenterology

## 2024-02-19 ENCOUNTER — Ambulatory Visit (HOSPITAL_BASED_OUTPATIENT_CLINIC_OR_DEPARTMENT_OTHER): Payer: Self-pay | Admitting: Certified Registered"

## 2024-02-19 DIAGNOSIS — M797 Fibromyalgia: Secondary | ICD-10-CM | POA: Diagnosis not present

## 2024-02-19 DIAGNOSIS — Z8673 Personal history of transient ischemic attack (TIA), and cerebral infarction without residual deficits: Secondary | ICD-10-CM | POA: Diagnosis not present

## 2024-02-19 DIAGNOSIS — Z9981 Dependence on supplemental oxygen: Secondary | ICD-10-CM | POA: Insufficient documentation

## 2024-02-19 DIAGNOSIS — R195 Other fecal abnormalities: Secondary | ICD-10-CM | POA: Diagnosis present

## 2024-02-19 DIAGNOSIS — K2970 Gastritis, unspecified, without bleeding: Secondary | ICD-10-CM

## 2024-02-19 DIAGNOSIS — Z8249 Family history of ischemic heart disease and other diseases of the circulatory system: Secondary | ICD-10-CM | POA: Insufficient documentation

## 2024-02-19 DIAGNOSIS — I10 Essential (primary) hypertension: Secondary | ICD-10-CM

## 2024-02-19 DIAGNOSIS — J4489 Other specified chronic obstructive pulmonary disease: Secondary | ICD-10-CM | POA: Insufficient documentation

## 2024-02-19 DIAGNOSIS — G709 Myoneural disorder, unspecified: Secondary | ICD-10-CM | POA: Diagnosis not present

## 2024-02-19 DIAGNOSIS — K2950 Unspecified chronic gastritis without bleeding: Secondary | ICD-10-CM | POA: Insufficient documentation

## 2024-02-19 DIAGNOSIS — K219 Gastro-esophageal reflux disease without esophagitis: Secondary | ICD-10-CM | POA: Insufficient documentation

## 2024-02-19 DIAGNOSIS — Z8619 Personal history of other infectious and parasitic diseases: Secondary | ICD-10-CM | POA: Diagnosis not present

## 2024-02-19 DIAGNOSIS — D509 Iron deficiency anemia, unspecified: Secondary | ICD-10-CM | POA: Diagnosis present

## 2024-02-19 DIAGNOSIS — K319 Disease of stomach and duodenum, unspecified: Secondary | ICD-10-CM | POA: Insufficient documentation

## 2024-02-19 DIAGNOSIS — F418 Other specified anxiety disorders: Secondary | ICD-10-CM | POA: Diagnosis not present

## 2024-02-19 DIAGNOSIS — M1990 Unspecified osteoarthritis, unspecified site: Secondary | ICD-10-CM | POA: Insufficient documentation

## 2024-02-19 DIAGNOSIS — Z79899 Other long term (current) drug therapy: Secondary | ICD-10-CM | POA: Diagnosis not present

## 2024-02-19 DIAGNOSIS — K3189 Other diseases of stomach and duodenum: Secondary | ICD-10-CM

## 2024-02-19 DIAGNOSIS — F1120 Opioid dependence, uncomplicated: Secondary | ICD-10-CM | POA: Diagnosis not present

## 2024-02-19 DIAGNOSIS — Z1211 Encounter for screening for malignant neoplasm of colon: Secondary | ICD-10-CM | POA: Insufficient documentation

## 2024-02-19 DIAGNOSIS — F0390 Unspecified dementia without behavioral disturbance: Secondary | ICD-10-CM | POA: Insufficient documentation

## 2024-02-19 HISTORY — PX: ESOPHAGOGASTRODUODENOSCOPY (EGD) WITH PROPOFOL: SHX5813

## 2024-02-19 HISTORY — PX: COLONOSCOPY WITH PROPOFOL: SHX5780

## 2024-02-19 SURGERY — ESOPHAGOGASTRODUODENOSCOPY (EGD) WITH PROPOFOL
Anesthesia: Monitor Anesthesia Care

## 2024-02-19 MED ORDER — PROPOFOL 10 MG/ML IV BOLUS
INTRAVENOUS | Status: DC | PRN
  Administered 2024-02-19: 20 mg via INTRAVENOUS

## 2024-02-19 MED ORDER — PROPOFOL 500 MG/50ML IV EMUL
INTRAVENOUS | Status: AC
  Filled 2024-02-19: qty 50

## 2024-02-19 MED ORDER — PROPOFOL 500 MG/50ML IV EMUL
INTRAVENOUS | Status: DC | PRN
  Administered 2024-02-19: 125 ug/kg/min via INTRAVENOUS

## 2024-02-19 MED ORDER — PROPOFOL 1000 MG/100ML IV EMUL
INTRAVENOUS | Status: AC
  Filled 2024-02-19: qty 100

## 2024-02-19 MED ORDER — LIDOCAINE 2% (20 MG/ML) 5 ML SYRINGE
INTRAMUSCULAR | Status: DC | PRN
  Administered 2024-02-19: 100 mg via INTRAVENOUS

## 2024-02-19 SURGICAL SUPPLY — 23 items
BLOCK BITE 60FR ADLT L/F BLUE (MISCELLANEOUS) ×1 IMPLANT
ELECT REM PT RETURN 9FT ADLT (ELECTROSURGICAL) IMPLANT
ELECTRODE REM PT RTRN 9FT ADLT (ELECTROSURGICAL) IMPLANT
FLOOR PAD 36X40 (MISCELLANEOUS) ×1 IMPLANT
FORCEP RJ3 GP 1.8X160 W-NEEDLE (CUTTING FORCEPS) IMPLANT
FORCEPS BIOP RAD 4 LRG CAP 4 (CUTTING FORCEPS) IMPLANT
FORCEPS BIOP RJ4 240 W/NDL (CUTTING FORCEPS) IMPLANT
FORCEPS BXJMBJMB 240X2.8X (CUTTING FORCEPS) IMPLANT
INJECTOR/SNARE I SNARE (MISCELLANEOUS) IMPLANT
LUBRICANT JELLY 4.5OZ STERILE (MISCELLANEOUS) IMPLANT
MANIFOLD NEPTUNE II (INSTRUMENTS) IMPLANT
NDL SCLEROTHERAPY 25GX240 (NEEDLE) IMPLANT
NEEDLE SCLEROTHERAPY 25GX240 (NEEDLE) IMPLANT
PAD FLOOR 36X40 (MISCELLANEOUS) ×1 IMPLANT
PROBE APC STR FIRE (PROBE) IMPLANT
PROBE INJECTION GOLD 7FR (MISCELLANEOUS) IMPLANT
SNARE ROTATE MED OVAL 20MM (MISCELLANEOUS) IMPLANT
SNARE SHORT THROW 13M SML OVAL (MISCELLANEOUS) IMPLANT
SYR 50ML LL SCALE MARK (SYRINGE) IMPLANT
TRAP SPECIMEN MUCOUS 40CC (MISCELLANEOUS) IMPLANT
TUBING ENDO SMARTCAP PENTAX (MISCELLANEOUS) ×2 IMPLANT
TUBING IRRIGATION ENDOGATOR (MISCELLANEOUS) ×1 IMPLANT
WATER STERILE IRR 1000ML POUR (IV SOLUTION) IMPLANT

## 2024-02-19 NOTE — Discharge Instructions (Signed)

## 2024-02-19 NOTE — Op Note (Signed)
 [facility] Patient Name: Natalie Hendrix Procedure Date: 02/19/2024 MRN: [id number] Attending MD: Doristine Locks , MD, [id number] Date of Birth: 04/04/1951 CSN: [id number] Age: 73 Admit Type: Outpatient Procedure:                Upper GI endoscopy Indications:              Iron deficiency anemia, Esophageal reflux Providers:                Doristine Locks, MD, Vicki Mallet, RN, Geoffery Lyons, Technician Referring MD:             Doristine Locks, MD Medicines:                Monitored Anesthesia Care Complications:            No immediate complications. Estimated Blood Loss:     Estimated blood loss was minimal. Procedure:                Pre-Anesthesia Assessment:                           - Prior to the procedure, a History and Physical                            was performed, and patient medications and                            allergies were reviewed. The patient's tolerance of                            previous anesthesia was also reviewed. The risks                            and benefits of the procedure and the sedation                            options and risks were discussed with the patient.                            All questions were answered, and informed consent                            was obtained. Prior Anticoagulants: The patient has                            taken Plavix (clopidogrel), last dose was 5 days                            prior to procedure. ASA Grade Assessment: IV - A                            patient with severe systemic disease that is a  constant threat to life. After reviewing the risks                            and benefits, the patient was deemed in                            satisfactory condition to undergo the procedure.                           After obtaining informed consent, the endoscope was                            passed under direct vision. Throughout the                             procedure, the patient's blood pressure, pulse, and                            oxygen saturations were monitored continuously. The                            [id number]-H190 ([id number]) Olympus endoscope was introduced                            through the mouth, and advanced to the third part                            of duodenum. The upper GI endoscopy was                            accomplished without difficulty. The patient                            tolerated the procedure well. Scope In: Scope Out: Findings:      The examined esophagus was normal.      The Z-line was regular and was found 35 cm from the incisors.      Patchy mild inflammation characterized by erythema was found in the       gastric body and in the gastric antrum. Biopsies were taken with a cold       forceps for Helicobacter pylori testing. Estimated blood loss was       minimal.      The examined duodenum was normal. Biopsies were taken with a cold       forceps for histology. Estimated blood loss was minimal. Impression:               - Normal esophagus.                           - Z-line regular, 35 cm from the incisors.                           - Gastritis. Biopsied.                           -  Normal examined duodenum. Biopsied. Moderate Sedation:      Not Applicable - Patient had care per Anesthesia. Recommendation:           - Patient has a contact number available for                            emergencies. The signs and symptoms of potential                            delayed complications were discussed with the                            patient. Return to normal activities tomorrow.                            Written discharge instructions were provided to the                            patient.                           - Advance diet as tolerated.                           - Continue present medications.                           - Await pathology results.                            - Perform a colonoscopy today. Procedure Code(s):        --- Professional ---                           [phone], Esophagogastroduodenoscopy, flexible,                            transoral; with biopsy, single or multiple Diagnosis Code(s):        --- Professional ---                           K29.70, Gastritis, unspecified, without bleeding                           D50.9, Iron deficiency anemia, unspecified                           K21.9, Gastro-esophageal reflux disease without                            esophagitis CPT copyright 2022 American [employer] Association. All rights reserved. The codes documented in this report are preliminary and upon coder review may  be revised to meet current compliance requirements. Doristine Locks, MD 02/19/2024 9:10:40 AM Number of Addenda: 0

## 2024-02-19 NOTE — Interval H&P Note (Signed)
 History and Physical Interval Note:  02/19/2024 8:17 AM  Natalie Hendrix  has presented today for surgery, with the diagnosis of positive cologuard, history of h pylori, iron defiency anemia.  The various methods of treatment have been discussed with the patient and family. After consideration of risks, benefits and other options for treatment, the patient has consented to  Procedure(s): ESOPHAGOGASTRODUODENOSCOPY (EGD) WITH PROPOFOL (N/A) COLONOSCOPY WITH PROPOFOL (N/A) as a surgical intervention.  The patient's history has been reviewed, patient examined, no change in status, stable for surgery.  I have reviewed the patient's chart and labs.  Questions were answered to the patient's satisfaction.     Verlin Dike Ludie Pavlik

## 2024-02-19 NOTE — H&P (Signed)
 GASTROENTEROLOGY PROCEDURE H&P NOTE   Primary Care Physician: Dois Davenport, MD    Reason for Procedure:  Iron deficiency anemia, positive Cologuard, dysphagia  Plan:    EGD, colonoscopy   Patient is appropriate for endoscopic procedure(s) at [facility].  The nature of the procedure, as well as the risks, benefits, and alternatives were carefully and thoroughly reviewed with the patient. Ample time for discussion and questions allowed. The patient understood, was satisfied, and agreed to proceed.     HPI: Natalie Hendrix is a 73 y.o. female who presents for EGD and colonoscopy for evaluation of iron deficiency anemia, recent positive Cologuard, along with intermittent dysphagia.  Does have a history of GERD with prior noted erosive esophagitis.  Reflux otherwise well-controlled with Protonix.  Additionally, does have a history of H. pylori gastritis in the past.  History of secondary polycythemia and undergoes regular phlebotomies.  CBC earlier this month with H/H at baseline (no need for phlebotomy this week).  Due to elevated periprocedural risks from underlying comorbidities, procedures are scheduled at [facility] into today.  Has been holding Plavix x 5 days for procedures today.  Past Medical History:  Diagnosis Date   Allergic rhinitis    Allergy    Anxiety    Asthma    uses oxygen 2.5 l/m 24/7, sleeps elevated   Complication of anesthesia    very sensitive to sedatives, B/P drops   CVA (cerebral vascular accident) (HCC) 1999, 2014   residual left side weakness, dysphagia, word finding difficulty, and short term memory; uses mobile chair, cane   Dependence on supplemental oxygen    Depression    Dizziness    Fibromyalgia    Fluttering heart    GERD (gastroesophageal reflux disease)    Hyperlipidemia    Hypertension    Migraine headache    Migraine, unspecified, not intractable, without status migrainosus    Other amnesia     Polycythemia rubra vera (HCC)    Radiculopathy, site unspecified    Thoracic outlet syndrome 1981   repair   TIA (transient ischemic attack)    Unspecified chronic bronchitis (HCC)    Unspecified dementia without behavioral disturbance    Vertigo     Past Surgical History:  Procedure Laterality Date   APPENDECTOMY     BALLOON DILATION N/A 10/27/2013   Procedure: BALLOON DILATION;  Surgeon: Willis Modena, MD;  Location: [facility];  Service: Endoscopy;  Laterality: N/A;   BREAST SURGERY  1979   CHOLECYSTECTOMY     CYST REMOVAL HAND Bilateral    left done 10-13-13(Dr. Gramig)   DILATION AND CURETTAGE OF UTERUS     ESOPHAGOGASTRODUODENOSCOPY (EGD) WITH PROPOFOL N/A 10/27/2013   Procedure: ESOPHAGOGASTRODUODENOSCOPY (EGD) WITH PROPOFOL;  Surgeon: Willis Modena, MD;  Location: [facility];  Service: Endoscopy;  Laterality: N/A;   GALLBLADDER SURGERY     KNEE SURGERY     repair torn ligament/capsule knee cruciat   REDUCTION MAMMAPLASTY     RESECTION RIB PARTIAL     RHINOPLASTY     Root Resection and Revascularization  1980   of Long thoracic artery   scalenectomy     synonectomy     TRANSTHORACIC ECHOCARDIOGRAM  12/2019   Normal LV function.  EF 66 5%.  Grade 1 diastolic dysfunction.  Normal echocardiogram for age    Prior to Admission medications   Medication Sig Start Date End Date Taking? Authorizing Provider  azelastine (ASTELIN) 0.1 % nasal  spray Place 1 spray into both nostrils 2 (two) times daily. 07/28/23  Yes Jetty Duhamel D, MD  beclomethasone (BECONASE-AQ) 42 MCG/SPRAY nasal spray 2 puffs each nostril once daily 09/19/21  Yes Young, Joni Fears D, MD  buPROPion (WELLBUTRIN XL) 300 MG 24 hr tablet Take 1 tablet (300 mg total) by mouth daily with breakfast. 08/16/16  Yes Eustaquio Boyden, MD  Calcium Carbonate-Vit D-Min (CALCIUM 1200 PO) Take 1,250 mg by mouth daily.    Yes [provider]  cyclobenzaprine (FLEXERIL) 10 MG tablet Take 10 mg by mouth every 8  (eight) hours as needed. 12/31/19  Yes [provider]  ezetimibe (ZETIA) 10 MG tablet TAKE 1 TABLET BY MOUTH DAILY. 12/09/16  Yes Eustaquio Boyden, MD  fexofenadine [facility] ALLERGY) 180 MG tablet Take 1 tablet (180 mg total) by mouth daily. 07/28/23  Yes Young, Joni Fears D, MD  hydrOXYzine (ATARAX/VISTARIL) 10 MG tablet Take 10 mg by mouth 3 (three) times daily as needed. 06/15/20  Yes [provider]  LORazepam (ATIVAN) 1 MG tablet Take 1 tablet (1 mg total) by mouth 2 (two) times daily. May take extra 3rd dose if needed Patient taking differently: Take 1 mg by mouth daily as needed. May take extra 3rd dose if needed 02/03/17  Yes Doreene Nest, NP  magnesium gluconate (MAGONATE) 500 MG tablet Take 500 mg by mouth daily as needed (constipation).   Yes [provider]  metoprolol succinate (TOPROL-XL) 50 MG 24 hr tablet Take 1 tablet (50 mg total) by mouth daily with breakfast. Patient taking differently: Take 75 mg by mouth daily with breakfast. 11/07/16  Yes Eustaquio Boyden, MD  morphine (MS CONTIN) 30 MG 12 hr tablet Take 1 tablet by mouth every 12 (twelve) hours. 02/14/20  Yes [provider]  morphine (MSIR) 15 MG tablet Take 15 mg by mouth every 4 (four) hours as needed for severe pain.   Yes [provider]  Multiple Vitamins-Minerals (CENTRUM SILVER) tablet Take 1 tablet by mouth daily.   Yes [provider]  Omega-3 Fatty Acids (FISH OIL) 1200 MG CAPS Take 1,200 mg by mouth at bedtime.    Yes [provider]  OXYGEN Inhale 2.5 L/hr into the lungs continuous.   Yes [provider]  pantoprazole (PROTONIX) 40 MG tablet TAKE 1 TABLET (40 MG TOTAL) BY MOUTH DAILY. 02/03/17  Yes Eustaquio Boyden, MD  pravastatin (PRAVACHOL) 40 MG tablet Take 1 tablet (40 mg total) by mouth at bedtime. 12/09/16  Yes Eustaquio Boyden, MD  albuterol [facility] HFA) [phone] Base) MCG/ACT inhaler Inhale 2 puffs into the lungs every 6 (six) hours as  needed for wheezing or shortness of breath. 07/28/23   Jetty Duhamel D, MD  albuterol (PROVENTIL) (2.5 MG/3ML) 0.083% nebulizer solution Take 3 mLs (2.5 mg total) by nebulization every 6 (six) hours as needed for wheezing or shortness of breath. 07/28/23 07/21/25  Waymon Budge, MD  benzonatate (TESSALON PERLES) 100 MG capsule Take 1 capsule (100 mg total) by mouth every 6 (six) hours as needed for cough. 07/28/23 07/27/24  Jetty Duhamel D, MD  clopidogrel (PLAVIX) 75 MG tablet Take 1 tablet (75 mg total) by mouth daily. 08/08/23   Josph Macho, MD  ESTRACE VAGINAL 0.1 MG/GM vaginal cream Place 1 Applicatorful vaginally daily. Small amount each dose per pt 06/24/12   [provider]  hyoscyamine (ANASPAZ) 0.125 MG TBDP disintergrating tablet Place 1 tablet (0.125 mg total) under the tongue 2 (two) times daily as needed. Patient  taking differently: Place 0.125 mg under the tongue 2 (two) times daily as needed for bladder spasms or cramping. 02/03/17   Josph Macho, MD  lidocaine (LIDODERM) 5 % Place 1 patch onto the skin as needed (pain). Remove & Discard patch within 12 hours or as directed by MD    [provider]  meclizine (ANTIVERT) 25 MG tablet Take 1 tablet (25 mg total) by mouth 3 (three) times daily as needed for dizziness. 3 month supply Patient taking differently: Take 25 mg by mouth 3 (three) times daily. 3 month supply 11/22/16   Josph Macho, MD  Polyethyl Glycol-Propyl Glycol 0.4-0.3 % SOLN Apply 1 drop to eye daily as needed (dry eyes).     [provider]  promethazine (PHENERGAN) 25 MG tablet Take 25 mg by mouth every 8 (eight) hours as needed for nausea or vomiting.  01/09/15   [provider]  Respiratory Therapy Supplies (NEBULIZER) DEVI Use as directed 07/22/11   Waymon Budge, MD  ZOVIRAX 5 % Apply 1 application topically as needed (flair).  07/10/12   [provider]    No current facility-administered medications for this  encounter.   Facility-Administered Medications Ordered in Other Encounters  Medication Dose Route Frequency Provider Last Rate Last Admin   0.9 %  sodium chloride infusion  1,000 mL Intravenous Once Erenest Blank, NP        Allergies as of 12/24/2023 - Review Complete 12/24/2023  Allergen Reaction Noted   Epinephrine Palpitations 05/21/2011   Flonase [fluticasone] Palpitations 12/30/2019   Protriptyline hcl Other (See Comments) 05/21/2011   Tamiflu [oseltamivir phosphate] Other (See Comments) 04/07/2018   Tamiflu [oseltamivir] Anaphylaxis 07/26/2020   Tizanidine Other (See Comments) 05/21/2011   Gabapentin Other (See Comments) 05/21/2011   Other Hives and Rash 01/20/2019   Qvar [beclomethasone] Other (See Comments) 09/17/2012   Spiriva [tiotropium bromide monohydrate] Rash 09/17/2012   Tiotropium Rash 09/17/2012   Zonegran Other (See Comments) 05/21/2011   Advair diskus [fluticasone-salmeterol] Rash 08/10/2018   Baclofen Rash 05/21/2011   Chlorzoxazone Rash 07/04/2007   Lamotrigine Rash 10/20/2007   Levofloxacin Rash 05/21/2011   Protriptyline Rash 05/10/2014   Ropinirole Rash 05/10/2014   Tiotropium bromide monohydrate Rash 05/11/2021   Verapamil Rash 07/04/2007    Family History  Problem Relation Age of Onset   Cervical cancer Mother    Heart disease Mother    Hypertension Mother    Clotting disorder Mother        and aunts x 2   Irregular heart beat Mother    Emphysema Father    Hypertension Father    Lung cancer Father        cause of death.   Skin cancer Father    Heart disease Father    Hypertension Maternal Grandmother    Rheum arthritis Paternal Grandmother    Stroke Other        aunt   Hypertension Other        aunt   Stroke Other        uncle   Heart attack Other        uncle & Aunt   Emphysema Other        aunt   Allergies Other        aunt and sister   Other Other        polycythemia possible in 2 aunts   Other Other        aunt with brain  tumor  Social History   Socioeconomic History   Marital status: Married    Spouse name: Not on file   Number of children: 0   Years of education: Not on file   Highest education level: Master's degree (e.g., MA, MS, MEng, MEd, MSW, MBA)  Occupational History   Occupation: RETIRED    Employer: RETIRED    Comment: occupational hand therapist  Tobacco Use   Smoking status: Never   Smokeless tobacco: Never   Tobacco comments:    never used tobacco; secondary smoke exposure  Vaping Use   Vaping status: Never Used  Substance and Sexual Activity   Alcohol use: Not Currently    Alcohol/week: 0.0 standard drinks of alcohol    Comment: seldom, wine @ christmas   Drug use: No   Sexual activity: Not on file  Other Topics Concern   Not on file  Social History Narrative   Lives at home with husband  Pharmacist, hospital) and service dog   Right handed   Caffeine: tries to avoid    Social Drivers of Corporate investment banker Strain: Not on file  Food Insecurity: Not on file  Transportation Needs: Not on file  Physical Activity: Not on file  Stress: Not on file  Social Connections: Not on file  Intimate Partner Violence: Not on file    Physical Exam: Vital signs in last 24 hours: @There  were no vitals taken for this visit. GEN: NAD EYE: Sclerae anicteric ENT: MMM CV: Non-tachycardic Pulm: CTA b/l GI: Soft, NT/ND NEURO:  Alert & Oriented x 3   Doristine Locks, DO [facility]   02/19/2024 7:38 AM

## 2024-02-19 NOTE — Anesthesia Preprocedure Evaluation (Signed)
 Anesthesia Evaluation  Patient identified by MRN, date of birth, ID band Patient awake    Reviewed: Allergy & Precautions, H&P , NPO status , Patient's Chart, lab work & pertinent test results  History of Anesthesia Complications (+) history of anesthetic complications  Airway Mallampati: II  TM Distance: >3 FB Neck ROM: Full    Dental no notable dental hx.    Pulmonary asthma , COPD,  oxygen dependent CXR: 10-19-13: postsurgical changes right apex. No acute disease.  Oxygen 2.5 L/minute 24 hours per day.   Pulmonary exam normal breath sounds clear to auscultation       Cardiovascular Exercise Tolerance: Good hypertension, Pt. on medications and Pt. on home beta blockers negative cardio ROS Normal cardiovascular exam Rhythm:Regular Rate:Normal     Neuro/Psych  Headaches PSYCHIATRIC DISORDERS Anxiety Depression   Dementia Difficulty with gait, balance, swallowing, speaking, singing.  Neuromuscular disease CVA, Residual Symptoms    GI/Hepatic Neg liver ROS,GERD  ,,  Endo/Other  negative endocrine ROS    Renal/GU negative Renal ROS  negative genitourinary   Musculoskeletal  (+) Arthritis , Osteoarthritis,  Fibromyalgia -, narcotic dependent  Abdominal   Peds negative pediatric ROS (+)  Hematology  (+) Blood dyscrasia   Anesthesia Other Findings   Reproductive/Obstetrics negative OB ROS                             Anesthesia Physical Anesthesia Plan  ASA: IV  Anesthesia Plan: MAC   Post-op Pain Management: Minimal or no pain anticipated   Induction: Intravenous  PONV Risk Score and Plan: 2 and Propofol infusion and Treatment may vary due to age or medical condition  Airway Management Planned: Nasal Cannula  Additional Equipment:   Intra-op Plan:   Post-operative Plan:   Informed Consent: I have reviewed the patients History and Physical, chart, labs and discussed the  procedure including the risks, benefits and alternatives for the proposed anesthesia with the patient or authorized representative who has indicated his/her understanding and acceptance.     Dental advisory given  Plan Discussed with: CRNA  Anesthesia Plan Comments:         Anesthesia Quick Evaluation

## 2024-02-19 NOTE — Anesthesia Postprocedure Evaluation (Signed)
 Anesthesia Post Note  Patient: Natalie Hendrix  Procedure(s) Performed: ESOPHAGOGASTRODUODENOSCOPY (EGD) WITH PROPOFOL COLONOSCOPY WITH PROPOFOL     Patient location during evaluation: PACU Anesthesia Type: MAC Level of consciousness: awake and alert Pain management: pain level controlled Vital Signs Assessment: post-procedure vital signs reviewed and stable Respiratory status: spontaneous breathing, nonlabored ventilation and respiratory function stable Cardiovascular status: blood pressure returned to baseline and stable Postop Assessment: no apparent nausea or vomiting Anesthetic complications: no   No notable events documented.  Last Vitals:  Vitals:   02/19/24 0932 02/19/24 0937  BP: 96/64 (!) 120/50  Pulse: 77 74  Resp: 13 19  Temp:    SpO2: 100% 100%    Last Pain:  Vitals:   02/19/24 0937  TempSrc:   PainSc: 0-No pain                 Lowella Curb

## 2024-02-19 NOTE — Anesthesia Procedure Notes (Signed)
 Procedure Name: MAC Date/Time: 02/19/2024 8:31 AM  Performed by: Sindy Guadeloupe, CRNAPre-anesthesia Checklist: Patient identified, Emergency Drugs available, Suction available, Patient being monitored and Timeout performed Oxygen Delivery Method: Simple face mask Placement Confirmation: positive ETCO2

## 2024-02-19 NOTE — Transfer of Care (Signed)
 Immediate Anesthesia Transfer of Care Note  Patient: Natalie Hendrix  Procedure(s) Performed: ESOPHAGOGASTRODUODENOSCOPY (EGD) WITH PROPOFOL COLONOSCOPY WITH PROPOFOL  Patient Location: PACU and Endoscopy Unit  Anesthesia Type:MAC  Level of Consciousness: drowsy and responds to stimulation  Airway & Oxygen Therapy: Patient Spontanous Breathing and Patient connected to face mask oxygen  Post-op Assessment: Report given to RN and Post -op Vital signs reviewed and stable  Post vital signs: Reviewed and stable  Last Vitals:  Vitals Value Taken Time  BP 92/36 02/19/24 0907  Temp    Pulse 80 02/19/24 0908  Resp 17 02/19/24 0908  SpO2 100 % 02/19/24 0908  Vitals shown include unfiled device data.  Last Pain:  Vitals:   02/19/24 0744  TempSrc: Temporal  PainSc: 0-No pain         Complications: No notable events documented.

## 2024-02-19 NOTE — Op Note (Signed)
 [facility] Patient Name: Natalie Hendrix Procedure Date: 02/19/2024 MRN: [id number] Attending MD: Doristine Locks , MD, [id number] Date of Birth: 1950/12/21 CSN: [id number] Age: 73 Admit Type: Outpatient Procedure:                Colonoscopy Indications:              Positive Cologuard test, Iron deficiency anemia Providers:                Doristine Locks, MD, Vicki Mallet, RN, Geoffery Lyons, Technician Referring MD:             Doristine Locks, MD Medicines:                Monitored Anesthesia Care Complications:            No immediate complications. Estimated Blood Loss:     Estimated blood loss: none. Procedure:                Pre-Anesthesia Assessment:                           - Prior to the procedure, a History and Physical                            was performed, and patient medications and                            allergies were reviewed. The patient's tolerance of                            previous anesthesia was also reviewed. The risks                            and benefits of the procedure and the sedation                            options and risks were discussed with the patient.                            All questions were answered, and informed consent                            was obtained. Prior Anticoagulants: The patient has                            taken Plavix (clopidogrel), last dose was 5 days                            prior to procedure. ASA Grade Assessment: IV - A                            patient with severe systemic disease that is a  constant threat to life. After reviewing the risks                            and benefits, the patient was deemed in                            satisfactory condition to undergo the procedure.                           After obtaining informed consent, the colonoscope                            was passed under direct vision. Throughout the                             procedure, the patient's blood pressure, pulse, and                            oxygen saturations were monitored continuously. The                            CF-HQ190L ([id number]) Olympus colonoscope was                            introduced through the anus and advanced to the the                            terminal ileum. The colonoscopy was performed                            without difficulty. The patient tolerated the                            procedure well. The quality of the bowel                            preparation was good. The terminal ileum, ileocecal                            valve, appendiceal orifice, and rectum were                            photographed. Scope In: 8:48:23 AM Scope Out: 9:00:49 AM Scope Withdrawal Time: 0 hours 9 minutes 38 seconds  Total Procedure Duration: 0 hours 12 minutes 26 seconds  Findings:      The perianal and digital rectal examinations were normal.      The entire colon appeared normal.      Retroflexion in the rectum was not performed due to anatomy (narrow,       short rectal vault). Anterograde views were normal.      The terminal ileum appeared normal. Impression:               - The entire examined colon is normal.                           -  The examined portion of the ileum was normal.                           - No specimens collected. Moderate Sedation:      Not Applicable - Patient had care per Anesthesia. Recommendation:           - Patient has a contact number available for                            emergencies. The signs and symptoms of potential                            delayed complications were discussed with the                            patient. Return to normal activities tomorrow.                            Written discharge instructions were provided to the                            patient.                           - Advance diet as tolerated.                           - Continue  present medications.                           - Resume Plavix (clopidogrel) at prior dose                            tomorrow.                           - Repeat colonoscopy is not recommended due to                            current age (21 years or older) for screening                            purposes.                           - Return to GI clinic PRN. Procedure Code(s):        --- Professional ---                           [phone], Colonoscopy, flexible; diagnostic, including                            collection of specimen(s) by brushing or washing,                            when performed (separate procedure) Diagnosis Code(s):        ---  Professional ---                           R19.5, Other fecal abnormalities                           D50.9, Iron deficiency anemia, unspecified CPT copyright 2022 [employer]. All rights reserved. The codes documented in this report are preliminary and upon coder review may  be revised to meet current compliance requirements. Doristine Locks, MD 02/19/2024 9:14:00 AM Number of Addenda: 0

## 2024-02-20 LAB — SURGICAL PATHOLOGY

## 2024-02-22 ENCOUNTER — Encounter (HOSPITAL_COMMUNITY): Payer: Self-pay | Admitting: Gastroenterology

## 2024-02-25 ENCOUNTER — Encounter: Payer: Self-pay | Admitting: Gastroenterology

## 2024-03-02 ENCOUNTER — Inpatient Hospital Stay: Payer: Medicare HMO | Admitting: Family

## 2024-03-02 ENCOUNTER — Inpatient Hospital Stay: Payer: Medicare HMO | Attending: Hematology & Oncology

## 2024-03-02 ENCOUNTER — Inpatient Hospital Stay: Payer: Medicare HMO

## 2024-03-02 DIAGNOSIS — D509 Iron deficiency anemia, unspecified: Secondary | ICD-10-CM | POA: Insufficient documentation

## 2024-03-02 DIAGNOSIS — D751 Secondary polycythemia: Secondary | ICD-10-CM | POA: Insufficient documentation

## 2024-03-02 DIAGNOSIS — J449 Chronic obstructive pulmonary disease, unspecified: Secondary | ICD-10-CM | POA: Insufficient documentation

## 2024-03-02 DIAGNOSIS — Z7902 Long term (current) use of antithrombotics/antiplatelets: Secondary | ICD-10-CM | POA: Insufficient documentation

## 2024-03-08 ENCOUNTER — Inpatient Hospital Stay

## 2024-03-08 ENCOUNTER — Inpatient Hospital Stay (HOSPITAL_BASED_OUTPATIENT_CLINIC_OR_DEPARTMENT_OTHER): Admitting: Family

## 2024-03-08 VITALS — BP 106/56 | HR 77 | Temp 98.7°F | Resp 17 | Ht 64.0 in | Wt 169.0 lb

## 2024-03-08 DIAGNOSIS — D751 Secondary polycythemia: Secondary | ICD-10-CM

## 2024-03-08 DIAGNOSIS — D500 Iron deficiency anemia secondary to blood loss (chronic): Secondary | ICD-10-CM

## 2024-03-08 DIAGNOSIS — I10 Essential (primary) hypertension: Secondary | ICD-10-CM

## 2024-03-08 DIAGNOSIS — J449 Chronic obstructive pulmonary disease, unspecified: Secondary | ICD-10-CM | POA: Diagnosis not present

## 2024-03-08 DIAGNOSIS — Z7902 Long term (current) use of antithrombotics/antiplatelets: Secondary | ICD-10-CM | POA: Diagnosis not present

## 2024-03-08 DIAGNOSIS — D509 Iron deficiency anemia, unspecified: Secondary | ICD-10-CM | POA: Diagnosis not present

## 2024-03-08 DIAGNOSIS — E782 Mixed hyperlipidemia: Secondary | ICD-10-CM

## 2024-03-08 DIAGNOSIS — D45 Polycythemia vera: Secondary | ICD-10-CM

## 2024-03-08 LAB — RETICULOCYTES
Immature Retic Fract: 8.7 % (ref 2.3–15.9)
RBC.: 4.65 MIL/uL (ref 3.87–5.11)
Retic Count, Absolute: 48.8 10*3/uL (ref 19.0–186.0)
Retic Ct Pct: 1.1 % (ref 0.4–3.1)

## 2024-03-08 LAB — CBC WITH DIFFERENTIAL (CANCER CENTER ONLY)
Abs Immature Granulocytes: 0.01 10*3/uL (ref 0.00–0.07)
Basophils Absolute: 0 10*3/uL (ref 0.0–0.1)
Basophils Relative: 1 %
Eosinophils Absolute: 0.1 10*3/uL (ref 0.0–0.5)
Eosinophils Relative: 2 %
HCT: 36.4 % (ref 36.0–46.0)
Hemoglobin: 11.1 g/dL — ABNORMAL LOW (ref 12.0–15.0)
Immature Granulocytes: 0 %
Lymphocytes Relative: 26 %
Lymphs Abs: 1.4 10*3/uL (ref 0.7–4.0)
MCH: 23.9 pg — ABNORMAL LOW (ref 26.0–34.0)
MCHC: 30.5 g/dL (ref 30.0–36.0)
MCV: 78.4 fL — ABNORMAL LOW (ref 80.0–100.0)
Monocytes Absolute: 0.6 10*3/uL (ref 0.1–1.0)
Monocytes Relative: 11 %
Neutro Abs: 3.2 10*3/uL (ref 1.7–7.7)
Neutrophils Relative %: 60 %
Platelet Count: 257 10*3/uL (ref 150–400)
RBC: 4.64 MIL/uL (ref 3.87–5.11)
RDW: 14.3 % (ref 11.5–15.5)
WBC Count: 5.3 10*3/uL (ref 4.0–10.5)
nRBC: 0 % (ref 0.0–0.2)

## 2024-03-08 LAB — CMP (CANCER CENTER ONLY)
ALT: 12 U/L (ref 0–44)
AST: 19 U/L (ref 15–41)
Albumin: 4 g/dL (ref 3.5–5.0)
Alkaline Phosphatase: 61 U/L (ref 38–126)
Anion gap: 6 (ref 5–15)
BUN: 23 mg/dL (ref 8–23)
CO2: 29 mmol/L (ref 22–32)
Calcium: 9.4 mg/dL (ref 8.9–10.3)
Chloride: 106 mmol/L (ref 98–111)
Creatinine: 0.84 mg/dL (ref 0.44–1.00)
GFR, Estimated: >60 mL/min (ref >60)
Glucose, Bld: 88 mg/dL (ref 70–99)
Potassium: 4.6 mmol/L (ref 3.5–5.1)
Sodium: 141 mmol/L (ref 135–145)
Total Bilirubin: 0.5 mg/dL (ref 0.0–1.2)
Total Protein: 6.4 g/dL — ABNORMAL LOW (ref 6.5–8.1)

## 2024-03-08 LAB — IRON AND IRON BINDING CAPACITY (CC-WL,HP ONLY)
Iron: 42 ug/dL (ref 28–170)
Saturation Ratios: 8 % — ABNORMAL LOW (ref 10.4–31.8)
TIBC: 549 ug/dL — ABNORMAL HIGH (ref 250–450)
UIBC: 507 ug/dL — ABNORMAL HIGH (ref 148–442)

## 2024-03-08 LAB — LACTATE DEHYDROGENASE: LDH: 171 U/L (ref 98–192)

## 2024-03-08 LAB — FERRITIN: Ferritin: 5 ng/mL — ABNORMAL LOW (ref 11–307)

## 2024-03-08 NOTE — Progress Notes (Signed)
 Hematology and Oncology Follow Up Visit  Natalie Hendrix [id number] 05/21/1951 73 y.o. 03/08/2024   Principle Diagnosis:  Secondary polycythemia - JAK2 negative IDA- felt to be secondary to potential GI bleed- positive Cologuard    Current Therapy:        Phlebotomy to maintain hematocrit less than 38% Plavix 75 mg by mouth daily   Interim History:  [name] is here today for follow-up. She has been staying busy caring for her husband. She notes some fatigue, dizziness and SOB at times with over exertion.  She states that she has had a few mechanical falls and thankfully has not been seriously injured. She ambulates with a cane or a Rolator for added support.  Her recent EGD showed some gastritis. She is taking Protonix daily. She feels stress as a caregiver may have caused this as well.  Np fever, chills, n/v, cough, rash, chest pain, abdominal pain or changes in bowel or bladder habits.  No syncope reported.  Pedal pulses are 1+.  No swelling in her extremities.  Neuropathy in the hands and feet unchanged from baseline.  Appetite and hydration are fair. Weight is stable at 169 lbs.   ECOG Performance Status: 1 - Symptomatic but completely ambulatory  Medications:  Allergies as of 03/08/2024       Reactions   Epinephrine Palpitations   Raises heart rate   Flonase [fluticasone] Palpitations   Protriptyline Hcl Other (See Comments)   Severe constipation   Tamiflu [oseltamivir] Anaphylaxis   Tizanidine Other (See Comments)   panic   Gabapentin Other (See Comments)   arthralgia Other reaction(s): Arthralgia (Joint Pain)   Other Hives, Rash   boiron acteane   Qvar [beclomethasone] Other (See Comments)   Spiriva [tiotropium Bromide Monohydrate] Rash   Tiotropium Rash   Zonegran Other (See Comments)   Mood swings   Advair Diskus [fluticasone-salmeterol] Rash   Baclofen Rash   Chlorzoxazone Rash   Lamotrigine Rash   Levofloxacin Rash   Ropinirole Rash   Verapamil Rash         Medication List        Accurate as of March 08, 2024  1:21 PM. If you have any questions, ask your nurse or doctor.          albuterol (2.5 MG/3ML) 0.083% nebulizer solution Commonly known as: PROVENTIL Take 3 mLs (2.5 mg total) by nebulization every 6 (six) hours as needed for wheezing or shortness of breath.   albuterol 108 (90 Base) MCG/ACT inhaler Commonly known as: ProAir HFA Inhale 2 puffs into the lungs every 6 (six) hours as needed for wheezing or shortness of breath.   azelastine 0.1 % nasal spray Commonly known as: ASTELIN Place 1 spray into both nostrils 2 (two) times daily.   beclomethasone 42 MCG/SPRAY nasal spray Commonly known as: BECONASE-AQ 2 puffs each nostril once daily   benzonatate 100 MG capsule Commonly known as: Tessalon Perles Take 1 capsule (100 mg total) by mouth every 6 (six) hours as needed for cough.   buPROPion 300 MG 24 hr tablet Commonly known as: WELLBUTRIN XL Take 1 tablet (300 mg total) by mouth daily with breakfast.   CALCIUM 1200 PO Take 1,250 mg by mouth daily.   Centrum Silver tablet Take 1 tablet by mouth daily.   clopidogrel 75 MG tablet Commonly known as: PLAVIX Take 1 tablet (75 mg total) by mouth daily.   cyclobenzaprine 10 MG tablet Commonly known as: FLEXERIL Take 10 mg by mouth every 8 (  eight) hours as needed.   ESTRACE VAGINAL 0.1 MG/GM vaginal cream Generic drug: estradiol Place 1 Applicatorful vaginally daily. Small amount each dose per pt   ezetimibe 10 MG tablet Commonly known as: Zetia TAKE 1 TABLET BY MOUTH DAILY.   fexofenadine 180 MG tablet Commonly known as: Allegra Allergy Take 1 tablet (180 mg total) by mouth daily.   Fish Oil 1200 MG Caps Take 1,200 mg by mouth at bedtime.   hydrOXYzine 10 MG tablet Commonly known as: ATARAX Take 10 mg by mouth 3 (three) times daily as needed.   hyoscyamine 0.125 MG Tbdp disintergrating tablet Commonly known as: ANASPAZ Place 1 tablet (0.125 mg  total) under the tongue 2 (two) times daily as needed. What changed: reasons to take this   lidocaine 5 % Commonly known as: LIDODERM Place 1 patch onto the skin as needed (pain). Remove & Discard patch within 12 hours or as directed by MD   LORazepam 1 MG tablet Commonly known as: ATIVAN Take 1 tablet (1 mg total) by mouth 2 (two) times daily. May take extra 3rd dose if needed What changed:  when to take this reasons to take this   meclizine 25 MG tablet Commonly known as: ANTIVERT Take 1 tablet (25 mg total) by mouth 3 (three) times daily as needed for dizziness. 3 month supply What changed: when to take this   metoprolol succinate 50 MG 24 hr tablet Commonly known as: TOPROL-XL Take 1 tablet (50 mg total) by mouth daily with breakfast. What changed: how much to take   morphine 15 MG tablet Commonly known as: MSIR Take 15 mg by mouth every 4 (four) hours as needed for severe pain.   morphine 30 MG 12 hr tablet Commonly known as: MS CONTIN Take 1 tablet by mouth every 12 (twelve) hours.   Nebulizer Devi Use as directed   OXYGEN Inhale 2.5 L/hr into the lungs continuous.   pantoprazole 40 MG tablet Commonly known as: PROTONIX TAKE 1 TABLET (40 MG TOTAL) BY MOUTH DAILY.   Polyethyl Glycol-Propyl Glycol 0.4-0.3 % Soln Apply 1 drop to eye daily as needed (dry eyes).   pravastatin 40 MG tablet Commonly known as: PRAVACHOL Take 1 tablet (40 mg total) by mouth at bedtime.   promethazine 25 MG tablet Commonly known as: PHENERGAN Take 25 mg by mouth every 8 (eight) hours as needed for nausea or vomiting.   Zovirax 5 % Generic drug: acyclovir ointment Apply 1 application topically as needed (flair).        Allergies:  Allergies  Allergen Reactions   Epinephrine Palpitations    Raises heart rate    Flonase [Fluticasone] Palpitations   Protriptyline Hcl Other (See Comments)    Severe constipation   Tamiflu [Oseltamivir] Anaphylaxis   Tizanidine Other (See  Comments)    panic   Gabapentin Other (See Comments)    arthralgia Other reaction(s): Arthralgia (Joint Pain)   Other Hives and Rash    boiron acteane   Qvar [Beclomethasone] Other (See Comments)   Spiriva [Tiotropium Bromide Monohydrate] Rash   Tiotropium Rash   Zonegran Other (See Comments)    Mood swings   Advair Diskus [Fluticasone-Salmeterol] Rash   Baclofen Rash   Chlorzoxazone Rash   Lamotrigine Rash   Levofloxacin Rash   Ropinirole Rash   Verapamil Rash    Past Medical History, Surgical history, Social history, and Family History were reviewed and updated.  Review of Systems: All other 10 point review of systems is negative.  Physical Exam:  vitals were not taken for this visit.   Wt Readings from Last 3 Encounters:  02/19/24 169 lb (76.7 kg)  02/09/24 171 lb (77.6 kg)  01/29/24 174 lb (78.9 kg)    Ocular: Sclerae unicteric, pupils equal, round and reactive to light Ear-nose-throat: Oropharynx clear, dentition fair Lymphatic: No cervical or supraclavicular adenopathy Lungs no rales or rhonchi, good excursion bilaterally Heart regular rate and rhythm, no murmur appreciated Abd soft, nontender, positive bowel sounds MSK no focal spinal tenderness, no joint edema Neuro: non-focal, well-oriented, appropriate affect Breasts: Deferred   Lab Results  Component Value Date   WBC 5.3 03/08/2024   HGB 11.1 (L) 03/08/2024   HCT 36.4 03/08/2024   MCV 78.4 (L) 03/08/2024   PLT 257 03/08/2024   Lab Results  Component Value Date   FERRITIN 6 (L) 01/23/2024   IRON 46 01/23/2024   TIBC 489 (H) 01/23/2024   UIBC 443 (H) 01/23/2024   IRONPCTSAT 9 (L) 01/23/2024   Lab Results  Component Value Date   RETICCTPCT 1.1 03/08/2024   RBC 4.65 03/08/2024   RETICCTABS 55.1 04/27/2015   No results found for: "KPAFRELGTCHN", "LAMBDASER", "KAPLAMBRATIO" No results found for: "IGGSERUM", "IGA", "IGMSERUM" No results found for: "TOTALPROTELP", "ALBUMINELP", "A1GS", "A2GS",  "BETS", "BETA2SER", "GAMS", "MSPIKE", "SPEI"   Chemistry      Component Value Date/Time   NA 141 01/23/2024 1045   NA 147 (H) 10/08/2017 1410   NA 141 02/17/2017 1132   K 4.6 01/23/2024 1045   K 4.6 10/08/2017 1410   K 5.3 (H) 02/17/2017 1132   CL 105 01/23/2024 1045   CL 106 10/08/2017 1410   CO2 30 01/23/2024 1045   CO2 30 10/08/2017 1410   CO2 29 02/17/2017 1132   BUN 18 01/23/2024 1045   BUN 15 10/08/2017 1410   BUN 17.8 02/17/2017 1132   CREATININE 0.70 01/23/2024 1045   CREATININE 1.0 10/08/2017 1410   CREATININE 0.8 02/17/2017 1132      Component Value Date/Time   CALCIUM 9.6 01/23/2024 1045   CALCIUM 9.0 10/08/2017 1410   CALCIUM 9.3 02/17/2017 1132   ALKPHOS 54 01/23/2024 1045   ALKPHOS 59 10/08/2017 1410   ALKPHOS 79 02/17/2017 1132   AST 19 01/23/2024 1045   AST 22 02/17/2017 1132   ALT 15 01/23/2024 1045   ALT 26 10/08/2017 1410   ALT 20 02/17/2017 1132   BILITOT 0.5 01/23/2024 1045   BILITOT 0.39 02/17/2017 1132       Impression and Plan: Ms. Tarr is a very pleasant 73 yo caucasian female with secondary polycythemia (JAK-2 negative) due to COPD.  No phlebotomy needed this month.  Iron studies pending.  Follow-up in 1 month.  Eileen Stanford, NP 4/7/20251:21 PM

## 2024-04-07 ENCOUNTER — Inpatient Hospital Stay

## 2024-04-07 ENCOUNTER — Inpatient Hospital Stay (HOSPITAL_BASED_OUTPATIENT_CLINIC_OR_DEPARTMENT_OTHER): Admitting: Family

## 2024-04-07 ENCOUNTER — Inpatient Hospital Stay: Attending: Hematology & Oncology

## 2024-04-07 VITALS — BP 125/60 | HR 78 | Temp 99.0°F | Resp 19 | Wt 168.1 lb

## 2024-04-07 DIAGNOSIS — D500 Iron deficiency anemia secondary to blood loss (chronic): Secondary | ICD-10-CM

## 2024-04-07 DIAGNOSIS — Z7902 Long term (current) use of antithrombotics/antiplatelets: Secondary | ICD-10-CM | POA: Diagnosis not present

## 2024-04-07 DIAGNOSIS — D751 Secondary polycythemia: Secondary | ICD-10-CM | POA: Diagnosis present

## 2024-04-07 DIAGNOSIS — K922 Gastrointestinal hemorrhage, unspecified: Secondary | ICD-10-CM

## 2024-04-07 DIAGNOSIS — D509 Iron deficiency anemia, unspecified: Secondary | ICD-10-CM | POA: Diagnosis not present

## 2024-04-07 DIAGNOSIS — J449 Chronic obstructive pulmonary disease, unspecified: Secondary | ICD-10-CM | POA: Diagnosis not present

## 2024-04-07 DIAGNOSIS — D45 Polycythemia vera: Secondary | ICD-10-CM

## 2024-04-07 LAB — RETICULOCYTES
Immature Retic Fract: 14.2 % (ref 2.3–15.9)
RBC.: 4.74 MIL/uL (ref 3.87–5.11)
Retic Count, Absolute: 34.1 10*3/uL (ref 19.0–186.0)
Retic Ct Pct: 0.7 % (ref 0.4–3.1)

## 2024-04-07 LAB — CBC WITH DIFFERENTIAL (CANCER CENTER ONLY)
Abs Immature Granulocytes: 0.01 10*3/uL (ref 0.00–0.07)
Basophils Absolute: 0 10*3/uL (ref 0.0–0.1)
Basophils Relative: 1 %
Eosinophils Absolute: 0.1 10*3/uL (ref 0.0–0.5)
Eosinophils Relative: 1 %
HCT: 36.2 % (ref 36.0–46.0)
Hemoglobin: 11 g/dL — ABNORMAL LOW (ref 12.0–15.0)
Immature Granulocytes: 0 %
Lymphocytes Relative: 13 %
Lymphs Abs: 0.8 10*3/uL (ref 0.7–4.0)
MCH: 23.3 pg — ABNORMAL LOW (ref 26.0–34.0)
MCHC: 30.4 g/dL (ref 30.0–36.0)
MCV: 76.5 fL — ABNORMAL LOW (ref 80.0–100.0)
Monocytes Absolute: 0.3 10*3/uL (ref 0.1–1.0)
Monocytes Relative: 6 %
Neutro Abs: 4.7 10*3/uL (ref 1.7–7.7)
Neutrophils Relative %: 79 %
Platelet Count: 261 10*3/uL (ref 150–400)
RBC: 4.73 MIL/uL (ref 3.87–5.11)
RDW: 14.8 % (ref 11.5–15.5)
WBC Count: 5.9 10*3/uL (ref 4.0–10.5)
nRBC: 0 % (ref 0.0–0.2)

## 2024-04-07 LAB — CMP (CANCER CENTER ONLY)
ALT: 14 U/L (ref 0–44)
AST: 20 U/L (ref 15–41)
Albumin: 4.2 g/dL (ref 3.5–5.0)
Alkaline Phosphatase: 57 U/L (ref 38–126)
Anion gap: 5 (ref 5–15)
BUN: 18 mg/dL (ref 8–23)
CO2: 29 mmol/L (ref 22–32)
Calcium: 9.2 mg/dL (ref 8.9–10.3)
Chloride: 105 mmol/L (ref 98–111)
Creatinine: 0.77 mg/dL (ref 0.44–1.00)
GFR, Estimated: >60 mL/min (ref >60)
Glucose, Bld: 103 mg/dL — ABNORMAL HIGH (ref 70–99)
Potassium: 4.3 mmol/L (ref 3.5–5.1)
Sodium: 139 mmol/L (ref 135–145)
Total Bilirubin: 0.5 mg/dL (ref 0.0–1.2)
Total Protein: 6.4 g/dL — ABNORMAL LOW (ref 6.5–8.1)

## 2024-04-07 LAB — FERRITIN: Ferritin: 8 ng/mL — ABNORMAL LOW (ref 11–307)

## 2024-04-07 NOTE — Progress Notes (Signed)
 Hematology and Oncology Follow Up Visit  Natalie Hendrix Hendrix [id number] 1951/04/08 73 y.o. 04/07/2024   Principle Diagnosis:  Secondary polycythemia - JAK2 negative IDA- felt to be secondary to potential GI bleed- positive Cologuard    Current Therapy:        Phlebotomy to maintain hematocrit less than 38% Plavix  75 mg by mouth daily   Interim History:  Natalie Hendrix Hendrix is here today with her husband Natalie Hendrix for follow-up. She doing fairly well but has noted some increase in migraines, joint pain and feeling fatigued. She feels that this is due to all the work she has been doing out in her yard and garden.  Hct today is stable at 36%.  SOB is stable at supplemental O2.  Occasional dizziness.  No fever, chills, n/v, cough, rash, chest pain, palpitations, abdominal pain or changes in bowel or bladder habits.  No swelling, tenderness, numbness or tingling in her extremities.  No falls or syncope.  Appetite and hydration are good. Weight is stable at 160 lbs.   ECOG Performance Status: 1 - Symptomatic but completely ambulatory  Medications:  Allergies as of 04/07/2024       Reactions   Epinephrine Palpitations   Raises heart rate   Flonase  [fluticasone ] Palpitations   Protriptyline Hcl Other (See Comments)   Severe constipation   Tamiflu  [oseltamivir ] Anaphylaxis   Tizanidine Other (See Comments)   panic   Gabapentin Other (See Comments)   arthralgia Other reaction(s): Arthralgia (Joint Pain)   Other Hives, Rash   boiron acteane   Qvar  [beclomethasone] Other (See Comments)   Spiriva  [tiotropium Bromide  Monohydrate] Rash   Tiotropium Rash   Zonegran Other (See Comments)   Mood swings   Advair Diskus [fluticasone -salmeterol] Rash   Baclofen Rash   Chlorzoxazone Rash   Lamotrigine Rash   Levofloxacin Rash   Ropinirole Rash   Verapamil Rash        Medication List        Accurate as of Apr 07, 2024  1:28 PM. If you have any questions, ask your nurse or doctor.           albuterol  (2.5 MG/3ML) 0.083% nebulizer solution Commonly known as: PROVENTIL  Take 3 mLs (2.5 mg total) by nebulization every 6 (six) hours as needed for wheezing or shortness of breath.   albuterol  108 (90 Base) MCG/ACT inhaler Commonly known as: ProAir  HFA Inhale 2 puffs into the lungs every 6 (six) hours as needed for wheezing or shortness of breath.   azelastine  0.1 % nasal spray Commonly known as: ASTELIN  Place 1 spray into both nostrils 2 (two) times daily.   beclomethasone 42 MCG/SPRAY nasal spray Commonly known as: BECONASE-AQ 2 puffs each nostril once daily   benzonatate  100 MG capsule Commonly known as: Tessalon  Perles Take 1 capsule (100 mg total) by mouth every 6 (six) hours as needed for cough.   buPROPion  300 MG 24 hr tablet Commonly known as: WELLBUTRIN  XL Take 1 tablet (300 mg total) by mouth daily with breakfast.   CALCIUM 1200 PO Take 1,250 mg by mouth daily.   Centrum Silver tablet Take 1 tablet by mouth daily.   clopidogrel  75 MG tablet Commonly known as: PLAVIX  Take 1 tablet (75 mg total) by mouth daily.   cyclobenzaprine 10 MG tablet Commonly known as: FLEXERIL Take 10 mg by mouth every 8 (eight) hours as needed.   ESTRACE  VAGINAL 0.1 MG/GM vaginal cream Generic drug: estradiol  Place 1 Applicatorful vaginally daily. Small amount each dose per pt  ezetimibe  10 MG tablet Commonly known as: Zetia  TAKE 1 TABLET BY MOUTH DAILY.   fexofenadine  180 MG tablet Commonly known as: Allegra  Allergy  Take 1 tablet (180 mg total) by mouth daily.   Fish Oil 1200 MG Caps Take 1,200 mg by mouth at bedtime.   hydrOXYzine 10 MG tablet Commonly known as: ATARAX Take 10 mg by mouth 3 (three) times daily as needed.   hyoscyamine  0.125 MG Tbdp disintergrating tablet Commonly known as: ANASPAZ  Place 1 tablet (0.125 mg total) under the tongue 2 (two) times daily as needed. What changed: reasons to take this   lidocaine  5 % Commonly known as:  LIDODERM  Place 1 patch onto the skin as needed (pain). Remove & Discard patch within 12 hours or as directed by MD   LORazepam  1 MG tablet Commonly known as: ATIVAN  Take 1 tablet (1 mg total) by mouth 2 (two) times daily. May take extra 3rd dose if needed What changed:  when to take this reasons to take this   meclizine  25 MG tablet Commonly known as: ANTIVERT  Take 1 tablet (25 mg total) by mouth 3 (three) times daily as needed for dizziness. 3 month supply What changed: when to take this   metoprolol  succinate 50 MG 24 hr tablet Commonly known as: TOPROL -XL Take 1 tablet (50 mg total) by mouth daily with breakfast. What changed: how much to take   morphine 15 MG tablet Commonly known as: MSIR Take 15 mg by mouth every 4 (four) hours as needed for severe pain.   morphine 30 MG 12 hr tablet Commonly known as: MS CONTIN Take 1 tablet by mouth every 12 (twelve) hours.   Nebulizer Devi Use as directed   OXYGEN  Inhale 2.5 L/hr into the lungs continuous.   pantoprazole  40 MG tablet Commonly known as: PROTONIX  TAKE 1 TABLET (40 MG TOTAL) BY MOUTH DAILY.   Polyethyl Glycol-Propyl Glycol 0.4-0.3 % Soln Apply 1 drop to eye daily as needed (dry eyes).   pravastatin  40 MG tablet Commonly known as: PRAVACHOL  Take 1 tablet (40 mg total) by mouth at bedtime.   promethazine  25 MG tablet Commonly known as: PHENERGAN  Take 25 mg by mouth every 8 (eight) hours as needed for nausea or vomiting.   Zovirax 5 % Generic drug: acyclovir ointment Apply 1 application topically as needed (flair).        Allergies:  Allergies  Allergen Reactions   Epinephrine Palpitations    Raises heart rate    Flonase  [Fluticasone ] Palpitations   Protriptyline Hcl Other (See Comments)    Severe constipation   Tamiflu  [Oseltamivir ] Anaphylaxis   Tizanidine Other (See Comments)    panic   Gabapentin Other (See Comments)    arthralgia Other reaction(s): Arthralgia (Joint Pain)   Other Hives  and Rash    boiron acteane   Qvar  [Beclomethasone] Other (See Comments)   Spiriva  [Tiotropium Bromide  Monohydrate] Rash   Tiotropium Rash   Zonegran Other (See Comments)    Mood swings   Advair Diskus [Fluticasone -Salmeterol] Rash   Baclofen Rash   Chlorzoxazone Rash   Lamotrigine Rash   Levofloxacin Rash   Ropinirole Rash   Verapamil Rash    Past Medical History, Surgical history, Social history, and Family History were reviewed and updated.  Review of Systems: All other 10 point review of systems is negative.   Physical Exam:  weight is 168 lb 1.3 oz (76.2 kg). Her oral temperature is 99 F (37.2 C). Her blood pressure is 125/60 and her pulse  is 78. Her respiration is 19 and oxygen  saturation is 100%.   Wt Readings from Last 3 Encounters:  04/07/24 168 lb 1.3 oz (76.2 kg)  03/08/24 169 lb (76.7 kg)  02/19/24 169 lb (76.7 kg)    Ocular: Sclerae unicteric, pupils equal, round and reactive to light Ear-nose-throat: Oropharynx clear, dentition fair Lymphatic: No cervical or supraclavicular adenopathy Lungs no rales or rhonchi, good excursion bilaterally Heart regular rate and rhythm, no murmur appreciated Abd soft, nontender, positive bowel sounds MSK no focal spinal tenderness, no joint edema Neuro: non-focal, well-oriented, appropriate affect Breasts: Deferred   Lab Results  Component Value Date   WBC 5.9 04/07/2024   HGB 11.0 (L) 04/07/2024   HCT 36.2 04/07/2024   MCV 76.5 (L) 04/07/2024   PLT 261 04/07/2024   Lab Results  Component Value Date   FERRITIN 5 (L) 03/08/2024   IRON 42 03/08/2024   TIBC 549 (H) 03/08/2024   UIBC 507 (H) 03/08/2024   IRONPCTSAT 8 (L) 03/08/2024   Lab Results  Component Value Date   RETICCTPCT 0.7 04/07/2024   RBC 4.73 04/07/2024   RETICCTABS 55.1 04/27/2015   No results found for: "KPAFRELGTCHN", "LAMBDASER", "KAPLAMBRATIO" No results found for: "IGGSERUM", "IGA", "IGMSERUM" No results found for: "TOTALPROTELP",  "ALBUMINELP", "A1GS", "A2GS", "BETS", "BETA2SER", "GAMS", "MSPIKE", "SPEI"   Chemistry      Component Value Date/Time   NA 141 03/08/2024 1259   NA 147 (H) 10/08/2017 1410   NA 141 02/17/2017 1132   K 4.6 03/08/2024 1259   K 4.6 10/08/2017 1410   K 5.3 (H) 02/17/2017 1132   CL 106 03/08/2024 1259   CL 106 10/08/2017 1410   CO2 29 03/08/2024 1259   CO2 30 10/08/2017 1410   CO2 29 02/17/2017 1132   BUN 23 03/08/2024 1259   BUN 15 10/08/2017 1410   BUN 17.8 02/17/2017 1132   CREATININE 0.84 03/08/2024 1259   CREATININE 1.0 10/08/2017 1410   CREATININE 0.8 02/17/2017 1132      Component Value Date/Time   CALCIUM 9.4 03/08/2024 1259   CALCIUM 9.0 10/08/2017 1410   CALCIUM 9.3 02/17/2017 1132   ALKPHOS 61 03/08/2024 1259   ALKPHOS 59 10/08/2017 1410   ALKPHOS 79 02/17/2017 1132   AST 19 03/08/2024 1259   AST 22 02/17/2017 1132   ALT 12 03/08/2024 1259   ALT 26 10/08/2017 1410   ALT 20 02/17/2017 1132   BILITOT 0.5 03/08/2024 1259   BILITOT 0.39 02/17/2017 1132       Impression and Plan: Ms. Dickinson is a very pleasant 73 yo caucasian female with secondary polycythemia (JAK-2 negative) due to COPD.  No phlebotomy needed this month.  Iron studies pending.  Follow-up in 1 month.  Kennard Pea, NP 5/7/20251:28 PM

## 2024-04-08 LAB — IRON AND IRON BINDING CAPACITY (CC-WL,HP ONLY)
Iron: 60 ug/dL (ref 28–170)
Saturation Ratios: 11 % (ref 10.4–31.8)
TIBC: 535 ug/dL — ABNORMAL HIGH (ref 250–450)
UIBC: 475 ug/dL — ABNORMAL HIGH (ref 148–442)

## 2024-04-12 ENCOUNTER — Encounter: Payer: Self-pay | Admitting: Family

## 2024-04-13 ENCOUNTER — Other Ambulatory Visit: Payer: Self-pay | Admitting: *Deleted

## 2024-04-13 DIAGNOSIS — D45 Polycythemia vera: Secondary | ICD-10-CM

## 2024-04-13 DIAGNOSIS — Z8673 Personal history of transient ischemic attack (TIA), and cerebral infarction without residual deficits: Secondary | ICD-10-CM

## 2024-04-13 MED ORDER — CLOPIDOGREL BISULFATE 75 MG PO TABS: 75.0000 mg | ORAL_TABLET | Freq: Every day | ORAL | 3 refills | Status: DC

## 2024-04-14 ENCOUNTER — Ambulatory Visit

## 2024-04-14 ENCOUNTER — Encounter: Payer: Self-pay | Admitting: Cardiology

## 2024-04-14 ENCOUNTER — Ambulatory Visit: Payer: Medicare HMO | Attending: Cardiology | Admitting: Cardiology

## 2024-04-14 VITALS — BP 110/62 | HR 71 | Ht 65.0 in | Wt 171.0 lb

## 2024-04-14 DIAGNOSIS — R0609 Other forms of dyspnea: Secondary | ICD-10-CM

## 2024-04-14 DIAGNOSIS — R072 Precordial pain: Secondary | ICD-10-CM

## 2024-04-14 DIAGNOSIS — E782 Mixed hyperlipidemia: Secondary | ICD-10-CM

## 2024-04-14 DIAGNOSIS — R002 Palpitations: Secondary | ICD-10-CM

## 2024-04-14 DIAGNOSIS — I10 Essential (primary) hypertension: Secondary | ICD-10-CM | POA: Diagnosis not present

## 2024-04-14 MED ORDER — METOPROLOL TARTRATE 50 MG PO TABS: 50.0000 mg | ORAL_TABLET | Freq: Once | ORAL | 0 refills | Status: DC

## 2024-04-14 NOTE — Progress Notes (Unsigned)
 Enrolled patient for a 14 day Zio XT  monitor to be mailed to patients home

## 2024-04-14 NOTE — Patient Instructions (Addendum)
 Medication Instructions:   Your physician recommends that you continue on your current medications as directed. Please refer to the Current Medication list given to you today.    *If you need a refill on your cardiac medications before your next appointment, please call your pharmacy*   Lab Work: BMET --TODAY DOWNSTAIRS FIRST FLOOR   If you have labs (blood work) drawn today and your tests are completely normal, you will receive your results only by: MyChart Message (if you have MyChart) OR A paper copy in the mail If you have any lab test that is abnormal or we need to change your treatment, we will call you to review the results.    Testing/Procedures:    Your cardiac CT will be scheduled at one of the below locations:    Jeral[facility] Mom[facility] [address]  [place], [place] [address] Opening March 29, 2024       If scheduled at the Heart and Vascular Tower at [employer], please enter the parking lot using the [address] street entrance and use the FREE valet service at the patient drop-off area. Enter the buidling and check-in with registration on the main floor.   Please follow these instructions carefully (unless otherwise directed):  An IV will be required for this test and Nitroglycerin will be given.  Hold all erectile dysfunction medications at least 3 days (72 hrs) prior to test. (Ie viagra, cialis, sildenafil, tadalafil, etc)   On the Night Before the Test: Be sure to Drink plenty of water. Do not consume any caffeinated/decaffeinated beverages or chocolate 12 hours prior to your test. Do not take any antihistamines 12 hours prior to your test.  DO NOT TAKE ALLEGRA  12 HOURS PRIOR TO THIS TEST   On the Day of the Test: Drink plenty of water until 1 hour prior to the test. Do not eat any food 1 hour prior to test. You may take your regular medications prior to the test.  Take metoprolol  (Lopressor ) 50 MG ALONG WITH YOUR REGULAR  SCHEDULED METOPROLOL  SUCCINATE (TOPROL  XL) 50 MG two hours prior to this test.  PLEASE HOLD YOUR REGULAR SCHEDULED EVENING DOSE OF METOPROLOL  SUCCINATE (TOPROL  XL) 25 MG THE EVENING OF THE TEST  FEMALES- please wear underwire-free bra if available, avoid dresses & tight clothing       After the Test: Drink plenty of water. After receiving IV contrast, you may experience a mild flushed feeling. This is normal. On occasion, you may experience a mild rash up to 24 hours after the test. This is not dangerous. If this occurs, you can take Benadryl  25 mg, Zyrtec, Claritin , or Allegra  and increase your fluid intake. (Patients taking Tikosyn should avoid Benadryl , and may take Zyrtec, Claritin , or Allegra ) If you experience trouble breathing, this can be serious. If it is severe call 911 IMMEDIATELY. If it is mild, please call our office.  We will call to schedule your test 2-4 weeks out understanding that some insurance companies will need an authorization prior to the service being performed.   For more information and frequently asked questions, please visit our website : [email or web address]  For non-scheduling related questions, please contact the cardiac imaging nurse navigator should you have any questions/concerns: Cardiac Imaging Nurse Navigators Direct Office Dial: [phone]   For scheduling needs, including cancellations and rescheduling, please call [place], [phone].    ZIO XT- Long Term Monitor Instructions  Your physician has requested you wear a ZIO patch monitor for 14 days.  This is a single patch monitor. [employer] supplies one patch monitor per enrollment. Additional stickers are not available. Please do not apply patch if you will be having a Nuclear Stress Test,  Echocardiogram, Cardiac CT, MRI, or Chest Xray during the period you would be wearing the  monitor. The patch cannot be worn during these tests. You cannot remove and re-apply the  ZIO XT patch  monitor.  Your ZIO patch monitor will be mailed 3 day USPS to your address on file. It may take 3-5 days  to receive your monitor after you have been enrolled.  Once you have received your monitor, please review the enclosed instructions. Your monitor  has already been registered assigning a specific monitor serial # to you.  Billing and Patient Assistance Program Information  We have supplied [employer] with any of your insurance information on file for billing purposes. [employer] offers a sliding scale Patient Assistance Program for patients that do not have  insurance, or whose insurance does not completely cover the cost of the ZIO monitor.  You must apply for the Patient Assistance Program to qualify for this discounted rate.  To apply, please call [employer] at [phone], select option 4, select option 2, ask to apply for  Patient Assistance Program. Sanna Crystal will ask your household income, and how many people  are in your household. They will quote your out-of-pocket cost based on that information.  [employer] will also be able to set up a 39-month, interest-free payment plan if needed.  Applying the monitor   Shave hair from upper left chest.  Hold abrader disc by orange tab. Rub abrader in 40 strokes over the upper left chest as  indicated in your monitor instructions.  Clean area with 4 enclosed alcohol pads. Let dry.  Apply patch as indicated in monitor instructions. Patch will be placed under collarbone on left  side of chest with arrow pointing upward.  Rub patch adhesive wings for 2 minutes. Remove white label marked "1". Remove the white  label marked "2". Rub patch adhesive wings for 2 additional minutes.  While looking in a mirror, press and release button in center of patch. A small green light will  flash 3-4 times. This will be your only indicator that the monitor has been turned on.  Do not shower for the first 24 hours. You may shower after the first 24 hours.  Press the  button if you feel a symptom. You will hear a small click. Record Date, Time and  Symptom in the Patient Logbook.  When you are ready to remove the patch, follow instructions on the last 2 pages of Patient  Logbook. Stick patch monitor onto the last page of Patient Logbook.  Place Patient Logbook in the blue and white box. Use locking tab on box and tape box closed  securely. The blue and white box has prepaid postage on it. Please place it in the mailbox as  soon as possible. Your physician should have your test results approximately 7 days after the  monitor has been mailed back to [facility].  Call [facility] Customer Care at [phone] if you have questions regarding  your ZIO XT patch monitor. Call them immediately if you see an orange light blinking on your  monitor.  If your monitor falls off in less than 4 days, contact our Monitor department at [phone].  If your monitor becomes loose or falls off after 4 days call [employer] at [phone] for  suggestions on securing your monitor  Follow-Up: At [facility], you and your health needs are our priority.  As part of our continuing mission to provide you with exceptional heart care, we have created designated Provider Care Teams.  These Care Teams include your primary Cardiologist (physician) and Advanced Practice Providers (APPs -  Physician Assistants and Nurse Practitioners) who all work together to provide you with the care you need, when you need it.  We recommend signing up for the patient portal called "MyChart".  Sign up information is provided on this After Visit Summary.  MyChart is used to connect with patients for Virtual Visits (Telemedicine).  Patients are able to view lab/test results, encounter notes, upcoming appointments, etc.  Non-urgent messages can be sent to your provider as well.   To learn more about what you can do with MyChart, go to [email or web address].    Your next appointment:   6  week(s)  The format for your next appointment:   In Person  Provider:   Randene Bustard, MD   Other Instructions

## 2024-04-14 NOTE — Progress Notes (Unsigned)
 Cardiology Office Note:  .   Date:  04/15/2024  ID:  Natalie Hendrix, DOB [birth year], MRN [id number] PCP: Allene Ivan, MD  [facility] Providers Cardiologist:  Randene Bustard, MD     No chief complaint on file.   Patient Profile: .     Natalie Hendrix is a  73 y.o. female  with a PMH notable for history of Polycythemia Vera (phlebotomy dependent), prior CVA, COPD with Reactive Airway Disease component on Chronic Oxygen , and hyperlipidemia with who presents here for close follow-up evaluation of chest discomfort and dyspnea at the request of Allene Ivan, MD.  I have not seen [name] since January 2021.     Natalie Hendrix was last seen on February 09, 2024 as a preop assessment for colonoscopy by Palmer Bobo, NP.  She had no cardiovascular complaints at that time no exertional chest pain or pressure.  She is relatively immobilized and has chronic oxygen  dependent COPD with dyspnea.  No notable palpitations, syncope or near syncope.  She does have some vertigo and dizziness well-controlled with meclizine .  Activity consists of walking the dog and cleaning the house as well as doing work on their [place] acre property. -> With no active symptoms, was felt to be stable for colonoscopy.  Subjective  Discussed the use of AI scribe software for clinical note transcription with the patient, who gave verbal consent to proceed.  History of Present Illness Natalie Hendrix is a 73 year old female with hypertension and polycythemia vera who presents with palpitations and exertional symptoms.  She experiences palpitations described as a 'pause' in her heart rhythm, occurring approximately once or twice a week, particularly during exertion. These episodes prompt her to rest and perform relaxation techniques, which alleviate the symptoms. She is on metoprolol , recently increased from 50 mg to 75 mg, taken as 50 mg in the morning and 25 mg in the evening, but has not noticed a significant  change in her symptoms with the increased dose.  She has a history of hypertension and is currently on metoprolol  as her sole antihypertensive medication. She mentions a family history of hypertension, particularly in the females of her family, including her grandmother. She experiences exertional symptoms such as a sensation of pressure and pauses in her heart rhythm when engaging in activities like gardening or vacuuming.  She is also on pravastatin  and Zetia  for cholesterol management. Her recent labs from January 18th showed a total cholesterol of 153 mg/dL, HDL of 71 mg/dL, triglycerides of 80 mg/dL, and LDL of 66 mg/dL, which are within normal limits. No significant muscle aches or myalgias associated with her cholesterol medications.  She has polycythemia vera and undergoes regular phlebotomy if her hematocrit exceeds 38. Her hematocrit was 40.6 with a hemoglobin of 12.2 during her last check. She is on Plavix  to manage her blood viscosity and prevent clotting issues. She reports that her red blood cells have become smaller and that her body tries to make more of them.  She uses continuous oxygen  therapy at home, provided by a machine from [employer], to manage her breathing, particularly at night. She sleeps with her head elevated to prevent sinus issues rather than due to shortness of breath. No waking up at night due to breathing difficulties and no significant swelling in her legs, aside from issues related to a bum knee.   Objective   Cardiac Medications - Toprol  XL:  50 mg in the morning and 25 mg in  the evening - Pravastatin  40 mg daily; - Zetia  10 mg daily; -1200 mg fish oil daily - Plavix  75 mg daily  - Pantoprazole  40 mg daily - Allegra  180 mg daily - Wellbutrin  200 mg daily - PRNs: meclizine  25 mg (up to 3 times daily); Phenergan  25 mg; nebulizer; Ativan  1 mg; Zovirax; MS Contin 30 mg every 12 hours; Atarax 10 mg  - Oxygen   Studies Reviewed: Natalie Hendrix         LABS Total cholesterol:  153 mg/dL (03/47/4259) HDL: 71 mg/dL (56/38/7564) Triglycerides: 80 mg/dL (33/29/5188) LDL: 66 mg/dL (41/66/0630) Glucose: 87 mg/dL (16/12/930) Creatinine: 0.73 mg/dL (35/57/3220) Potassium: 4.7 mmol/L (12/20/2023) Hemoglobin: 12.2 g/dL (25/42/7062) Hematocrit: 40.6% (12/20/2023) Platelets: 274 x10^9/L (12/20/2023) TSH: 2.29 mIU/L (12/20/2023) Hemoglobin A1c: 5.8% (12/20/2023) Lab Results  Component Value Date   CHOL 152 02/15/2021   HDL 58 02/15/2021   LDLCALC 58 02/15/2021   TRIG 181 (H) 02/15/2021   CHOLHDL 2.6 02/15/2021    DIAGNOSTIC Esophagogastroduodenoscopy (EGD): Normal Colonoscopy: Normal  Previously reviewed Studies ECHO 12/09/2019: Normal LV function.  EF 66 5%.  Grade 1 diastolic dysfunction.  Normal echocardiogram for age   Risk Assessment/Calculations:          Physical Exam:   VS:  BP 110/62   Pulse 71   Ht 5\' 5"  (1.651 m)   Wt 171 lb (77.6 kg)   SpO2 94%   BMI 28.46 kg/m    Wt Readings from Last 3 Encounters:  04/14/24 171 lb (77.6 kg)  04/07/24 168 lb 1.3 oz (76.2 kg)  03/08/24 169 lb (76.7 kg)    GEN: Well nourished, well developed in no acute distress; chronically ill appearing.  She has her oxygen  by nasal cannula.  She also has a knee brace on her left knee.  Apparently suffered a recent fall. NECK: No JVD; No carotid bruits CARDIAC: RRR with mild ectopy, distant heart sounds but mostly normal S1, S2; no murmurs, rubs, gallops RESPIRATORY: Good decreased air movement but nonlabored.  Mild late expiratory wheeze but no rales or rhonchi. ABDOMEN: Soft, non-tender, non-distended EXTREMITIES:  No edema; No deformity     ASSESSMENT AND PLAN: .    Problem List Items Addressed This Visit       Cardiology Problems   Essential hypertension (Chronic)   BP well-controlled on current medications continue Toprol  at 75 mg daily split 50 and 25 mg.      Relevant Medications   metoprolol  succinate (TOPROL -XL) 25 MG 24 hr tablet   HLD  (hyperlipidemia) (Chronic)   Cholesterol levels well-controlled with pravastatin  and Zetia . Total cholesterol 153 mg/dL, HDL 71 mg/dL, LDL 66 mg/dL, triglycerides 80 mg/dL. No adverse effects from statin therapy. Cholesterol control adequate for coronary artery disease.      Relevant Medications   metoprolol  succinate (TOPROL -XL) 25 MG 24 hr tablet     Other   DOE (dyspnea on exertion)   Exertion dyspnea is not that different than what she normally has, but she now has associated chest discomfort/palpitations associated with it. - Coronary CTA -Pending results Coronary CTA for ischemic evaluation, would consider echocardiogram      Relevant Orders   LONG TERM MONITOR (3-14 DAYS)   Basic metabolic panel with GFR (Completed)   CT CORONARY MORPH W/CTA COR W/SCORE W/CA W/CM &/OR WO/CM   Palpitations   Premature ventricular contractions (PVCs) noted on EKG Intermittent palpitations suggestive of premature beats with compensatory pause. Current metoprolol  treatment ineffective at increased dose. Differential includes exertional angina; further evaluation  needed to rule out coronary artery disease. Discussed benign nature of premature beats and metoprolol  management. - Order Zio patch monitor for two weeks to capture arrhythmia events; ? symptom correlation with PVCs. - Check CT coronary angiogram to exclude ischemic etiology. - Continue metoprolol  75 mg daily, adjust based on Zio patch results.      Relevant Orders   LONG TERM MONITOR (3-14 DAYS)   Precordial pain - Primary   Concerning symptoms of a fluttering pressure type palpitations or pounding sensation that does occur with rest.  This is somewhat new. Differential includes exertional angina; further evaluation needed to rule out coronary artery disease. Discussed benign nature of premature beats and metoprolol  management.  She does have cardiac risk factors, and I am leery of stress test evaluation as there is high likelihood of  false positivity.  - Will check Coronary CTA along with Zio patch monitor - Order baseline chemistry panel to assess kidney function prior to potential CT coronary angiogram.      Relevant Orders   Basic metabolic panel with GFR (Completed)   CT CORONARY MORPH W/CTA COR W/SCORE W/CA W/CM &/OR WO/CM          Follow-Up: Return in about 6 weeks (around 05/26/2024) for [employer]. - Schedule follow-up after Zio patch results.  Total time spent: Recording duration: 27 min spent with patient + 18 min spent charting = 45 min I spent 45 minutes in the care of Natalie Hendrix today including reviewing labs (1 minute), reviewing outside labs from [facility] (1 minute), reviewing studies (1 minutes reviewing prior echo), face to face time discussing treatment options (27), reviewing records from previous clinic notes most notably APP note from 2 months ago and my last note (for minutes), 13 minutes dictating, and documenting in the encounter.     Signed, Arleen Lacer, MD, MS Randene Bustard, M.D., M.S. Interventional Chartered certified accountant  Pager # [phone]

## 2024-04-15 ENCOUNTER — Ambulatory Visit: Payer: Self-pay | Admitting: Cardiology

## 2024-04-15 DIAGNOSIS — R0609 Other forms of dyspnea: Secondary | ICD-10-CM | POA: Insufficient documentation

## 2024-04-15 DIAGNOSIS — R002 Palpitations: Secondary | ICD-10-CM | POA: Insufficient documentation

## 2024-04-15 DIAGNOSIS — R072 Precordial pain: Secondary | ICD-10-CM | POA: Insufficient documentation

## 2024-04-15 LAB — BASIC METABOLIC PANEL WITH GFR
BUN/Creatinine Ratio: 21 (ref 12–28)
BUN: 16 mg/dL (ref 8–27)
CO2: 25 mmol/L (ref 20–29)
Calcium: 9.3 mg/dL (ref 8.7–10.3)
Chloride: 102 mmol/L (ref 96–106)
Creatinine, Ser: 0.76 mg/dL (ref 0.57–1.00)
Glucose: 82 mg/dL (ref 70–99)
Potassium: 5.1 mmol/L (ref 3.5–5.2)
Sodium: 141 mmol/L (ref 134–144)
eGFR: 83 mL/min/{1.73_m2} (ref >59)

## 2024-04-15 NOTE — Assessment & Plan Note (Signed)
 Cholesterol levels well-controlled with pravastatin  and Zetia . Total cholesterol 153 mg/dL, HDL 71 mg/dL, LDL 66 mg/dL, triglycerides 80 mg/dL. No adverse effects from statin therapy. Cholesterol control adequate for coronary artery disease.

## 2024-04-15 NOTE — Assessment & Plan Note (Addendum)
 Exertion dyspnea is not that different than what she normally has, but she now has associated chest discomfort/palpitations associated with it. - Coronary CTA -Pending results Coronary CTA for ischemic evaluation, would consider echocardiogram

## 2024-04-15 NOTE — Assessment & Plan Note (Addendum)
 Concerning symptoms of a fluttering pressure type palpitations or pounding sensation that does occur with rest.  This is somewhat new. Differential includes exertional angina; further evaluation needed to rule out coronary artery disease. Discussed benign nature of premature beats and metoprolol  management.  She does have cardiac risk factors, and I am leery of stress test evaluation as there is high likelihood of false positivity.  - Will check Coronary CTA along with Zio patch monitor - Order baseline chemistry panel to assess kidney function prior to potential CT coronary angiogram.

## 2024-04-15 NOTE — Assessment & Plan Note (Signed)
 Premature ventricular contractions (PVCs) noted on EKG Intermittent palpitations suggestive of premature beats with compensatory pause. Current metoprolol  treatment ineffective at increased dose. Differential includes exertional angina; further evaluation needed to rule out coronary artery disease. Discussed benign nature of premature beats and metoprolol  management. - Order Zio patch monitor for two weeks to capture arrhythmia events; ? symptom correlation with PVCs. - Check CT coronary angiogram to exclude ischemic etiology. - Continue metoprolol  75 mg daily, adjust based on Zio patch results.

## 2024-04-15 NOTE — Assessment & Plan Note (Signed)
 BP well-controlled on current medications continue Toprol  at 75 mg daily split 50 and 25 mg.

## 2024-04-20 ENCOUNTER — Telehealth: Payer: Self-pay | Admitting: Cardiology

## 2024-04-20 NOTE — Telephone Encounter (Signed)
-----   Message from Randene Bustard sent at 04/15/2024  7:53 PM EDT ----- Pre-CT labs look great.  Renal function stable.  Potassium is on the upper limit of normal  Randene Bustard, MD

## 2024-04-20 NOTE — Telephone Encounter (Signed)
 Called and left detailed message of results on secure voicemail , per DPR.   Informed patient she can review results in mychart. Any question may call  back

## 2024-04-20 NOTE — Telephone Encounter (Signed)
 Pt returning call to nurse for lab results.

## 2024-04-20 NOTE — Telephone Encounter (Signed)
 Pt was just called to tell Natalie Hendrix and that she was so glad to hear her voice on her answer machine and that they (her husband and herself) had been thinking about her. Will get that message to her.

## 2024-05-03 ENCOUNTER — Telehealth (HOSPITAL_COMMUNITY): Payer: Self-pay | Admitting: *Deleted

## 2024-05-03 NOTE — Telephone Encounter (Signed)
 Reaching out to patient to offer assistance regarding upcoming cardiac imaging study; pt verbalizes understanding of appt date/time, parking situation and where to check in, pre-test NPO status and medications ordered, and verified current allergies; name and call back number provided for further questions should they arise  Chase Copping RN Navigator Cardiac Imaging [facility] [phone] office [phone] cell  Patient to take 50mg  metoprolol  tartrate two hours prior to her cardiac CT.

## 2024-05-04 ENCOUNTER — Ambulatory Visit (HOSPITAL_COMMUNITY)
Admission: RE | Admit: 2024-05-04 | Discharge: 2024-05-04 | Disposition: A | Discharge status: Home / Self Care | Source: Ambulatory Visit | Attending: Cardiology | Admitting: Cardiology

## 2024-05-04 DIAGNOSIS — R072 Precordial pain: Secondary | ICD-10-CM | POA: Diagnosis not present

## 2024-05-04 DIAGNOSIS — R0609 Other forms of dyspnea: Secondary | ICD-10-CM

## 2024-05-04 MED ORDER — NITROGLYCERIN 0.4 MG SL SUBL
SUBLINGUAL_TABLET | SUBLINGUAL | Status: AC
  Filled 2024-05-04: qty 2

## 2024-05-04 MED ORDER — NITROGLYCERIN 0.4 MG SL SUBL
0.8000 mg | SUBLINGUAL_TABLET | Freq: Once | SUBLINGUAL | Status: AC
  Administered 2024-05-04: 0.8 mg via SUBLINGUAL

## 2024-05-04 MED ORDER — IOHEXOL 350 MG/ML SOLN
100.0000 mL | Freq: Once | INTRAVENOUS | Status: AC | PRN
  Administered 2024-05-04: 100 mL via INTRAVENOUS

## 2024-05-05 ENCOUNTER — Inpatient Hospital Stay: Attending: Hematology & Oncology

## 2024-05-05 ENCOUNTER — Encounter: Payer: Self-pay | Admitting: Family

## 2024-05-05 ENCOUNTER — Encounter: Payer: Self-pay | Admitting: Internal Medicine

## 2024-05-05 ENCOUNTER — Inpatient Hospital Stay (HOSPITAL_BASED_OUTPATIENT_CLINIC_OR_DEPARTMENT_OTHER): Admitting: Family

## 2024-05-05 ENCOUNTER — Inpatient Hospital Stay

## 2024-05-05 VITALS — BP 124/41 | HR 70 | Temp 98.7°F | Resp 19 | Ht 65.0 in | Wt 171.0 lb

## 2024-05-05 DIAGNOSIS — D751 Secondary polycythemia: Secondary | ICD-10-CM | POA: Insufficient documentation

## 2024-05-05 DIAGNOSIS — D500 Iron deficiency anemia secondary to blood loss (chronic): Secondary | ICD-10-CM

## 2024-05-05 DIAGNOSIS — D45 Polycythemia vera: Secondary | ICD-10-CM

## 2024-05-05 DIAGNOSIS — R42 Dizziness and giddiness: Secondary | ICD-10-CM | POA: Insufficient documentation

## 2024-05-05 DIAGNOSIS — R002 Palpitations: Secondary | ICD-10-CM | POA: Diagnosis not present

## 2024-05-05 DIAGNOSIS — D509 Iron deficiency anemia, unspecified: Secondary | ICD-10-CM | POA: Insufficient documentation

## 2024-05-05 DIAGNOSIS — J449 Chronic obstructive pulmonary disease, unspecified: Secondary | ICD-10-CM | POA: Insufficient documentation

## 2024-05-05 DIAGNOSIS — G629 Polyneuropathy, unspecified: Secondary | ICD-10-CM | POA: Diagnosis not present

## 2024-05-05 LAB — CBC WITH DIFFERENTIAL (CANCER CENTER ONLY)
Abs Immature Granulocytes: 0.03 10*3/uL (ref 0.00–0.07)
Basophils Absolute: 0 10*3/uL (ref 0.0–0.1)
Basophils Relative: 0 %
Eosinophils Absolute: 0.1 10*3/uL (ref 0.0–0.5)
Eosinophils Relative: 3 %
HCT: 36.6 % (ref 36.0–46.0)
Hemoglobin: 11.1 g/dL — ABNORMAL LOW (ref 12.0–15.0)
Immature Granulocytes: 1 %
Lymphocytes Relative: 27 %
Lymphs Abs: 1.2 10*3/uL (ref 0.7–4.0)
MCH: 22.5 pg — ABNORMAL LOW (ref 26.0–34.0)
MCHC: 30.3 g/dL (ref 30.0–36.0)
MCV: 74.2 fL — ABNORMAL LOW (ref 80.0–100.0)
Monocytes Absolute: 0.5 10*3/uL (ref 0.1–1.0)
Monocytes Relative: 10 %
Neutro Abs: 2.7 10*3/uL (ref 1.7–7.7)
Neutrophils Relative %: 59 %
Platelet Count: 266 10*3/uL (ref 150–400)
RBC: 4.93 MIL/uL (ref 3.87–5.11)
RDW: 16.3 % — ABNORMAL HIGH (ref 11.5–15.5)
WBC Count: 4.5 10*3/uL (ref 4.0–10.5)
nRBC: 0 % (ref 0.0–0.2)

## 2024-05-05 LAB — CMP (CANCER CENTER ONLY)
ALT: 13 U/L (ref 0–44)
AST: 21 U/L (ref 15–41)
Albumin: 4.1 g/dL (ref 3.5–5.0)
Alkaline Phosphatase: 64 U/L (ref 38–126)
Anion gap: 6 (ref 5–15)
BUN: 22 mg/dL (ref 8–23)
CO2: 27 mmol/L (ref 22–32)
Calcium: 9 mg/dL (ref 8.9–10.3)
Chloride: 106 mmol/L (ref 98–111)
Creatinine: 0.85 mg/dL (ref 0.44–1.00)
GFR, Estimated: >60 mL/min (ref >60)
Glucose, Bld: 94 mg/dL (ref 70–99)
Potassium: 4.6 mmol/L (ref 3.5–5.1)
Sodium: 139 mmol/L (ref 135–145)
Total Bilirubin: 0.5 mg/dL (ref 0.0–1.2)
Total Protein: 6.5 g/dL (ref 6.5–8.1)

## 2024-05-05 LAB — RETICULOCYTES
Immature Retic Fract: 10 % (ref 2.3–15.9)
RBC.: 4.92 MIL/uL (ref 3.87–5.11)
Retic Count, Absolute: 34.9 10*3/uL (ref 19.0–186.0)
Retic Ct Pct: 0.7 % (ref 0.4–3.1)

## 2024-05-05 LAB — IRON AND IRON BINDING CAPACITY (CC-WL,HP ONLY)
Iron: 52 ug/dL (ref 28–170)
Saturation Ratios: 10 % — ABNORMAL LOW (ref 10.4–31.8)
TIBC: 514 ug/dL — ABNORMAL HIGH (ref 250–450)
UIBC: 462 ug/dL — ABNORMAL HIGH (ref 148–442)

## 2024-05-05 LAB — FERRITIN: Ferritin: 7 ng/mL — ABNORMAL LOW (ref 11–307)

## 2024-05-05 NOTE — Telephone Encounter (Signed)
 Please advise CT was ordered by Cardio

## 2024-05-05 NOTE — Progress Notes (Signed)
 Hematology and Oncology Follow Up Visit  Natalie Hendrix [id number] 1951/11/11 73 y.o. 05/05/2024   Principle Diagnosis:  Secondary polycythemia - JAK2 negative IDA- felt to be secondary to potential GI bleed- positive Cologuard    Current Therapy:        Phlebotomy to maintain hematocrit less than 38% Plavix  75 mg by mouth daily   Interim History:  Natalie Hendrix is here today for follow-up. She is doing quite well right now. She had her husband have been doing better at home. They got someone to mow their grass and are getting meals on wheels deliveries every 2 weeks. She has also started seeing a massage therapist and feels that this has really helped with her joint pain and stiffness.  She had one fall since we last saw her which she states happened when she tripped over [employer]er [employer]. No syncope Thankfully she states that she was not injured.  She notes SOB with heat and has been using her supplemental O2 as directed.  Occasional palpitations and dry cough.  No fever, chills, n/v, rash, chest pain, palpitations, abdominal pain or changes in bowel or bladder habits.  No swelling in her extremities.  Neuropathy unchanged from baseline.  Appetite and hydration have been good. Weight is stable at 171 lbs.   ECOG Performance Status: 1 - Symptomatic but completely ambulatory  Medications:  Allergies as of 05/05/2024       Reactions   Epinephrine Palpitations   Raises heart rate   Flonase  [fluticasone ] Palpitations   Protriptyline Hcl Other (See Comments)   Severe constipation   Tamiflu  [oseltamivir ] Anaphylaxis   Tizanidine Other (See Comments)   panic   Gabapentin Other (See Comments)   arthralgia Other reaction(s): Arthralgia (Joint Pain)   Other Hives, Rash   boiron acteane   Qvar  [beclomethasone] Other (See Comments)   Spiriva  [tiotropium Bromide  Monohydrate] Rash   Tiotropium Rash   Zonegran Other (See Comments)   Mood swings   Advair Diskus [fluticasone -salmeterol]  Rash   Baclofen Rash   Chlorzoxazone Rash   Lamotrigine Rash   Levofloxacin Rash   Ropinirole Rash   Verapamil Rash        Medication List        Accurate as of May 05, 2024  1:06 PM. If you have any questions, ask your nurse or doctor.          albuterol  (2.5 MG/3ML) 0.083% nebulizer solution Commonly known as: PROVENTIL  Take 3 mLs (2.5 mg total) by nebulization every 6 (six) hours as needed for wheezing or shortness of breath.   albuterol  108 (90 Base) MCG/ACT inhaler Commonly known as: ProAir  HFA Inhale 2 puffs into the lungs every 6 (six) hours as needed for wheezing or shortness of breath.   azelastine  0.1 % nasal spray Commonly known as: ASTELIN  Place 1 spray into both nostrils 2 (two) times daily.   beclomethasone 42 MCG/SPRAY nasal spray Commonly known as: BECONASE-AQ 2 puffs each nostril once daily   benzonatate  100 MG capsule Commonly known as: Tessalon  Perles Take 1 capsule (100 mg total) by mouth every 6 (six) hours as needed for cough.   buPROPion  300 MG 24 hr tablet Commonly known as: WELLBUTRIN  XL Take 1 tablet (300 mg total) by mouth daily with breakfast.   CALCIUM 1200 PO Take 1,250 mg by mouth daily.   Centrum Silver tablet Take 1 tablet by mouth daily.   clopidogrel  75 MG tablet Commonly known as: PLAVIX  Take 1 tablet (75 mg total)  by mouth daily.   cyclobenzaprine 10 MG tablet Commonly known as: FLEXERIL Take 10 mg by mouth every 8 (eight) hours as needed.   ESTRACE  VAGINAL 0.1 MG/GM vaginal cream Generic drug: estradiol  Place 1 Applicatorful vaginally daily. Small amount each dose per pt   ezetimibe  10 MG tablet Commonly known as: Zetia  TAKE 1 TABLET BY MOUTH DAILY.   fexofenadine  180 MG tablet Commonly known as: Allegra  Allergy  Take 1 tablet (180 mg total) by mouth daily.   Fish Oil 1200 MG Caps Take 1,200 mg by mouth at bedtime.   hydrOXYzine 10 MG tablet Commonly known as: ATARAX Take 10 mg by mouth 3 (three) times  daily as needed.   hyoscyamine  0.125 MG Tbdp disintergrating tablet Commonly known as: ANASPAZ  Place 1 tablet (0.125 mg total) under the tongue 2 (two) times daily as needed. What changed: reasons to take this   lidocaine  5 % Commonly known as: LIDODERM  Place 1 patch onto the skin as needed (pain). Remove & Discard patch within 12 hours or as directed by MD   LORazepam  1 MG tablet Commonly known as: ATIVAN  Take 1 tablet (1 mg total) by mouth 2 (two) times daily. May take extra 3rd dose if needed What changed:  when to take this reasons to take this   meclizine  25 MG tablet Commonly known as: ANTIVERT  Take 1 tablet (25 mg total) by mouth 3 (three) times daily as needed for dizziness. 3 month supply What changed: when to take this   metoprolol  succinate 50 MG 24 hr tablet Commonly known as: TOPROL -XL Take 1 tablet (50 mg total) by mouth daily with breakfast. What changed: how much to take   metoprolol  succinate 25 MG 24 hr tablet Commonly known as: TOPROL -XL Take 25 mg by mouth daily. What changed: Another medication with the same name was changed. Make sure you understand how and when to take each.   metoprolol  tartrate 50 MG tablet Commonly known as: LOPRESSOR  Take 1 tablet (50 mg total) by mouth once for 1 dose. Take 90-120 minutes prior to scan. Hold for SBP less than 110.   morphine 15 MG tablet Commonly known as: MSIR Take 15 mg by mouth every 4 (four) hours as needed for severe pain.   morphine 30 MG 12 hr tablet Commonly known as: MS CONTIN Take 1 tablet by mouth every 12 (twelve) hours.   Nebulizer Devi Use as directed   OXYGEN  Inhale 2.5 L/hr into the lungs continuous.   pantoprazole  40 MG tablet Commonly known as: PROTONIX  TAKE 1 TABLET (40 MG TOTAL) BY MOUTH DAILY.   Polyethyl Glycol-Propyl Glycol 0.4-0.3 % Soln Apply 1 drop to eye daily as needed (dry eyes).   pravastatin  40 MG tablet Commonly known as: PRAVACHOL  Take 1 tablet (40 mg total) by  mouth at bedtime.   promethazine  25 MG tablet Commonly known as: PHENERGAN  Take 25 mg by mouth every 8 (eight) hours as needed for nausea or vomiting.   Zovirax 5 % Generic drug: acyclovir ointment Apply 1 application topically as needed (flair).        Allergies:  Allergies  Allergen Reactions   Epinephrine Palpitations    Raises heart rate    Flonase  [Fluticasone ] Palpitations   Protriptyline Hcl Other (See Comments)    Severe constipation   Tamiflu  [Oseltamivir ] Anaphylaxis   Tizanidine Other (See Comments)    panic   Gabapentin Other (See Comments)    arthralgia Other reaction(s): Arthralgia (Joint Pain)   Other Hives and Rash  boiron acteane   Qvar  [Beclomethasone] Other (See Comments)   Spiriva  [Tiotropium Bromide  Monohydrate] Rash   Tiotropium Rash   Zonegran Other (See Comments)    Mood swings   Advair Diskus [Fluticasone -Salmeterol] Rash   Baclofen Rash   Chlorzoxazone Rash   Lamotrigine Rash   Levofloxacin Rash   Ropinirole Rash   Verapamil Rash    Past Medical History, Surgical history, Social history, and Family History were reviewed and updated.  Review of Systems: All other 10 point review of systems is negative.   Physical Exam:  vitals were not taken for this visit.   Wt Readings from Last 3 Encounters:  04/14/24 171 lb (77.6 kg)  04/07/24 168 lb 1.3 oz (76.2 kg)  03/08/24 169 lb (76.7 kg)    Ocular: Sclerae unicteric, pupils equal, round and reactive to light Ear-nose-throat: Oropharynx clear, dentition fair Lymphatic: No cervical or supraclavicular adenopathy Lungs no rales or rhonchi, good excursion bilaterally Heart regular rate and rhythm, no murmur appreciated Abd soft, nontender, positive bowel sounds MSK no focal spinal tenderness, no joint edema Neuro: non-focal, well-oriented, appropriate affect Breasts: Deferred   Lab Results  Component Value Date   WBC 5.9 04/07/2024   HGB 11.0 (L) 04/07/2024   HCT 36.2 04/07/2024    MCV 76.5 (L) 04/07/2024   PLT 261 04/07/2024   Lab Results  Component Value Date   FERRITIN 8 (L) 04/07/2024   IRON 60 04/07/2024   TIBC 535 (H) 04/07/2024   UIBC 475 (H) 04/07/2024   IRONPCTSAT 11 04/07/2024   Lab Results  Component Value Date   RETICCTPCT 0.7 04/07/2024   RBC 4.73 04/07/2024   RETICCTABS 55.1 04/27/2015   No results found for: "KPAFRELGTCHN", "LAMBDASER", "KAPLAMBRATIO" No results found for: "IGGSERUM", "IGA", "IGMSERUM" No results found for: "TOTALPROTELP", "ALBUMINELP", "A1GS", "A2GS", "BETS", "BETA2SER", "GAMS", "MSPIKE", "SPEI"   Chemistry      Component Value Date/Time   NA 141 04/14/2024 1541   NA 147 (H) 10/08/2017 1410   NA 141 02/17/2017 1132   K 5.1 04/14/2024 1541   K 4.6 10/08/2017 1410   K 5.3 (H) 02/17/2017 1132   CL 102 04/14/2024 1541   CL 106 10/08/2017 1410   CO2 25 04/14/2024 1541   CO2 30 10/08/2017 1410   CO2 29 02/17/2017 1132   BUN 16 04/14/2024 1541   BUN 15 10/08/2017 1410   BUN 17.8 02/17/2017 1132   CREATININE 0.76 04/14/2024 1541   CREATININE 0.77 04/07/2024 1254   CREATININE 1.0 10/08/2017 1410   CREATININE 0.8 02/17/2017 1132      Component Value Date/Time   CALCIUM 9.3 04/14/2024 1541   CALCIUM 9.0 10/08/2017 1410   CALCIUM 9.3 02/17/2017 1132   ALKPHOS 57 04/07/2024 1254   ALKPHOS 59 10/08/2017 1410   ALKPHOS 79 02/17/2017 1132   AST 20 04/07/2024 1254   AST 22 02/17/2017 1132   ALT 14 04/07/2024 1254   ALT 26 10/08/2017 1410   ALT 20 02/17/2017 1132   BILITOT 0.5 04/07/2024 1254   BILITOT 0.39 02/17/2017 1132       Impression and Plan: Natalie Hendrix is a very pleasant 73 yo caucasian female with secondary polycythemia (JAK-2 negative) due to COPD.  No phlebotomy needed this month, Hct 36%.  Iron studies pending.  Follow-up in 1 month.  Kennard Pea, NP 6/4/20251:06 PM

## 2024-05-05 NOTE — Telephone Encounter (Signed)
 Cardiac CT noted. Less than average calcium deposits. See you in August.

## 2024-05-07 ENCOUNTER — Telehealth: Payer: Self-pay

## 2024-05-07 DIAGNOSIS — J9611 Chronic respiratory failure with hypoxia: Secondary | ICD-10-CM

## 2024-05-07 NOTE — Telephone Encounter (Signed)
 Copied from CRM [id number]. Topic: Clinical - Order For Equipment >> May 07, 2024  1:15 PM Tyronne Galloway wrote: Reason for CRM: Pt is requesting a prescription from Dr. Linder Revere for an oxygen  portable concentrator from [employer]. Pt stated she uses [employer] and [place] and they will cover it. Pt's phone number [phone] ok to leave a vm. Pt stated she will follow up with a note through MyChart if she left out any details.   Called patient.  Patient wants to get a new POC as the battery in her current POC needs to be replaced and patient would like a new unit.  Note in OV from 01/29/2024 from Dr. Linder Revere states:  + O2 POC 2L.   Will send in order this encounter for new POC at 2L. DME is [facility]

## 2024-05-20 DIAGNOSIS — R0609 Other forms of dyspnea: Secondary | ICD-10-CM | POA: Diagnosis not present

## 2024-05-20 DIAGNOSIS — R002 Palpitations: Secondary | ICD-10-CM

## 2024-05-26 ENCOUNTER — Ambulatory Visit: Admitting: Cardiology

## 2024-05-31 ENCOUNTER — Encounter: Payer: Self-pay | Admitting: Family

## 2024-05-31 ENCOUNTER — Inpatient Hospital Stay

## 2024-05-31 ENCOUNTER — Inpatient Hospital Stay (HOSPITAL_BASED_OUTPATIENT_CLINIC_OR_DEPARTMENT_OTHER): Admitting: Family

## 2024-05-31 VITALS — BP 140/66 | HR 80 | Temp 98.6°F | Resp 17 | Wt 173.8 lb

## 2024-05-31 DIAGNOSIS — D751 Secondary polycythemia: Secondary | ICD-10-CM

## 2024-05-31 DIAGNOSIS — D500 Iron deficiency anemia secondary to blood loss (chronic): Secondary | ICD-10-CM

## 2024-05-31 LAB — CMP (CANCER CENTER ONLY)
ALT: 12 U/L (ref 0–44)
AST: 17 U/L (ref 15–41)
Albumin: 3.8 g/dL (ref 3.5–5.0)
Alkaline Phosphatase: 61 U/L (ref 38–126)
Anion gap: 7 (ref 5–15)
BUN: 15 mg/dL (ref 8–23)
CO2: 26 mmol/L (ref 22–32)
Calcium: 8.9 mg/dL (ref 8.9–10.3)
Chloride: 107 mmol/L (ref 98–111)
Creatinine: 0.76 mg/dL (ref 0.44–1.00)
GFR, Estimated: >60 mL/min (ref >60)
Glucose, Bld: 82 mg/dL (ref 70–99)
Potassium: 4.4 mmol/L (ref 3.5–5.1)
Sodium: 140 mmol/L (ref 135–145)
Total Bilirubin: 0.4 mg/dL (ref 0.0–1.2)
Total Protein: 6.3 g/dL — ABNORMAL LOW (ref 6.5–8.1)

## 2024-05-31 LAB — CBC WITH DIFFERENTIAL (CANCER CENTER ONLY)
Abs Immature Granulocytes: 0.04 10*3/uL (ref 0.00–0.07)
Basophils Absolute: 0 10*3/uL (ref 0.0–0.1)
Basophils Relative: 1 %
Eosinophils Absolute: 0.1 10*3/uL (ref 0.0–0.5)
Eosinophils Relative: 2 %
HCT: 36.5 % (ref 36.0–46.0)
Hemoglobin: 11 g/dL — ABNORMAL LOW (ref 12.0–15.0)
Immature Granulocytes: 1 %
Lymphocytes Relative: 18 %
Lymphs Abs: 0.9 10*3/uL (ref 0.7–4.0)
MCH: 22.3 pg — ABNORMAL LOW (ref 26.0–34.0)
MCHC: 30.1 g/dL (ref 30.0–36.0)
MCV: 74 fL — ABNORMAL LOW (ref 80.0–100.0)
Monocytes Absolute: 0.4 10*3/uL (ref 0.1–1.0)
Monocytes Relative: 9 %
Neutro Abs: 3.6 10*3/uL (ref 1.7–7.7)
Neutrophils Relative %: 69 %
Platelet Count: 265 10*3/uL (ref 150–400)
RBC: 4.93 MIL/uL (ref 3.87–5.11)
RDW: 17.3 % — ABNORMAL HIGH (ref 11.5–15.5)
WBC Count: 5.1 10*3/uL (ref 4.0–10.5)
nRBC: 0 % (ref 0.0–0.2)

## 2024-05-31 LAB — FERRITIN: Ferritin: 8 ng/mL — ABNORMAL LOW (ref 11–307)

## 2024-05-31 LAB — RETICULOCYTES
Immature Retic Fract: 14.6 % (ref 2.3–15.9)
RBC.: 4.95 MIL/uL (ref 3.87–5.11)
Retic Count, Absolute: 48.5 10*3/uL (ref 19.0–186.0)
Retic Ct Pct: 1 % (ref 0.4–3.1)

## 2024-05-31 LAB — IRON AND IRON BINDING CAPACITY (CC-WL,HP ONLY)
Iron: 31 ug/dL (ref 28–170)
Saturation Ratios: 6 % — ABNORMAL LOW (ref 10.4–31.8)
TIBC: 503 ug/dL — ABNORMAL HIGH (ref 250–450)
UIBC: 472 ug/dL — ABNORMAL HIGH (ref 148–442)

## 2024-05-31 NOTE — Progress Notes (Signed)
 Hematology and Oncology Follow Up Visit  Natalie Hendrix [id number] [birth year] 73 y.o. 05/31/2024   Principle Diagnosis:  Secondary polycythemia - JAK2 negative IDA- felt to be secondary to potential GI bleed- positive Cologuard    Current Therapy:        Phlebotomy to maintain hematocrit less than 38% Plavix  75 mg by mouth daily   Interim History:  Ms. Garguilo is here today for follow-up. She is having a severe migraine with light sensitivity. She states that [facility] has been giving her the run around and she has been out of her daily pain medication for a week.  She is headed there after our visit to wait and see if they have finally filled it for her.  She has been caring for her husband who has had a GI bug. She really is worn out today.  Hct is stable at 36% so thankfully she does not need a phlebotomy.  No fever, chills, abdominal pain or changes in bowel or bladder habits.  She has mild SOB which seems to be controlled on 2l supplemental O2.  Palpitations and dizziness at times.  No swelling in her extremities.  Neuropathy in the hands and feet unchanged from baseline.  No falls or syncope reported. She ambulates with her can or a walker for added support. She has a cane today but we will take downstairs in a wheelchair to her husband who is waiting in the car.  Appetite and hydration are fare. Weight is stable at 173 lbs.   ECOG Performance Status: 1 - Symptomatic but completely ambulatory  Medications:  Allergies as of 05/31/2024       Reactions   Epinephrine Palpitations   Raises heart rate   Flonase  [fluticasone ] Palpitations   Protriptyline Hcl Other (See Comments)   Severe constipation   Tamiflu  [oseltamivir ] Anaphylaxis   Tizanidine Other (See Comments)   panic   Gabapentin Other (See Comments)   arthralgia Other reaction(s): Arthralgia (Joint Pain)   Other Hives, Rash   boiron acteane   Qvar  [beclomethasone] Other (See Comments)   Spiriva  [tiotropium Bromide   Monohydrate] Rash   Tiotropium Rash   Zonegran Other (See Comments)   Mood swings   Advair Diskus [fluticasone -salmeterol] Rash   Baclofen Rash   Chlorzoxazone Rash   Lamotrigine Rash   Levofloxacin Rash   Ropinirole Rash   Verapamil Rash        Medication List        Accurate as of May 31, 2024  1:13 PM. If you have any questions, ask your nurse or doctor.          albuterol  (2.5 MG/3ML) 0.083% nebulizer solution Commonly known as: PROVENTIL  Take 3 mLs (2.5 mg total) by nebulization every 6 (six) hours as needed for wheezing or shortness of breath.   albuterol  108 (90 Base) MCG/ACT inhaler Commonly known as: ProAir  HFA Inhale 2 puffs into the lungs every 6 (six) hours as needed for wheezing or shortness of breath.   azelastine  0.1 % nasal spray Commonly known as: ASTELIN  Place 1 spray into both nostrils 2 (two) times daily.   beclomethasone 42 MCG/SPRAY nasal spray Commonly known as: BECONASE-AQ 2 puffs each nostril once daily   benzonatate  100 MG capsule Commonly known as: Tessalon  Perles Take 1 capsule (100 mg total) by mouth every 6 (six) hours as needed for cough.   buPROPion  300 MG 24 hr tablet Commonly known as: WELLBUTRIN  XL Take 1 tablet (300 mg total) by mouth daily with breakfast.  CALCIUM 1200 PO Take 1,250 mg by mouth daily.   Centrum Silver tablet Take 1 tablet by mouth daily.   clopidogrel  75 MG tablet Commonly known as: PLAVIX  Take 1 tablet (75 mg total) by mouth daily.   cyclobenzaprine 10 MG tablet Commonly known as: FLEXERIL Take 10 mg by mouth every 8 (eight) hours as needed.   ESTRACE  VAGINAL 0.1 MG/GM vaginal cream Generic drug: estradiol  Place 1 Applicatorful vaginally daily. Small amount each dose per pt   ezetimibe  10 MG tablet Commonly known as: Zetia  TAKE 1 TABLET BY MOUTH DAILY.   fexofenadine  180 MG tablet Commonly known as: Allegra  Allergy  Take 1 tablet (180 mg total) by mouth daily.   Fish Oil 1200 MG  Caps Take 1,200 mg by mouth at bedtime.   hydrOXYzine 10 MG tablet Commonly known as: ATARAX Take 10 mg by mouth 3 (three) times daily as needed.   hyoscyamine  0.125 MG Tbdp disintergrating tablet Commonly known as: ANASPAZ  Place 1 tablet (0.125 mg total) under the tongue 2 (two) times daily as needed. What changed: reasons to take this   lidocaine  5 % Commonly known as: LIDODERM  Place 1 patch onto the skin as needed (pain). Remove & Discard patch within 12 hours or as directed by MD   LORazepam  1 MG tablet Commonly known as: ATIVAN  Take 1 tablet (1 mg total) by mouth 2 (two) times daily. May take extra 3rd dose if needed What changed:  when to take this reasons to take this   meclizine  25 MG tablet Commonly known as: ANTIVERT  Take 1 tablet (25 mg total) by mouth 3 (three) times daily as needed for dizziness. 3 month supply What changed: when to take this   metoprolol  succinate 50 MG 24 hr tablet Commonly known as: TOPROL -XL Take 1 tablet (50 mg total) by mouth daily with breakfast. What changed: how much to take   metoprolol  succinate 25 MG 24 hr tablet Commonly known as: TOPROL -XL Take 25 mg by mouth daily. What changed: Another medication with the same name was changed. Make sure you understand how and when to take each.   metoprolol  tartrate 50 MG tablet Commonly known as: LOPRESSOR  Take 1 tablet (50 mg total) by mouth once for 1 dose. Take 90-120 minutes prior to scan. Hold for SBP less than 110.   morphine 15 MG tablet Commonly known as: MSIR Take 15 mg by mouth every 4 (four) hours as needed for severe pain.   morphine 30 MG 12 hr tablet Commonly known as: MS CONTIN Take 1 tablet by mouth every 12 (twelve) hours.   Nebulizer Devi Use as directed   OXYGEN  Inhale 2.5 L/hr into the lungs continuous.   pantoprazole  40 MG tablet Commonly known as: PROTONIX  TAKE 1 TABLET (40 MG TOTAL) BY MOUTH DAILY.   Polyethyl Glycol-Propyl Glycol 0.4-0.3 % Soln Apply 1  drop to eye daily as needed (dry eyes).   pravastatin  40 MG tablet Commonly known as: PRAVACHOL  Take 1 tablet (40 mg total) by mouth at bedtime.   promethazine  25 MG tablet Commonly known as: PHENERGAN  Take 25 mg by mouth every 8 (eight) hours as needed for nausea or vomiting.   Zovirax 5 % Generic drug: acyclovir ointment Apply 1 application topically as needed (flair).        Allergies:  Allergies  Allergen Reactions   Epinephrine Palpitations    Raises heart rate    Flonase  [Fluticasone ] Palpitations   Protriptyline Hcl Other (See Comments)    Severe constipation  Tamiflu  [Oseltamivir ] Anaphylaxis   Tizanidine Other (See Comments)    panic   Gabapentin Other (See Comments)    arthralgia Other reaction(s): Arthralgia (Joint Pain)   Other Hives and Rash    boiron acteane   Qvar  [Beclomethasone] Other (See Comments)   Spiriva  [Tiotropium Bromide  Monohydrate] Rash   Tiotropium Rash   Zonegran Other (See Comments)    Mood swings   Advair Diskus [Fluticasone -Salmeterol] Rash   Baclofen Rash   Chlorzoxazone Rash   Lamotrigine Rash   Levofloxacin Rash   Ropinirole Rash   Verapamil Rash    Past Medical History, Surgical history, Social history, and Family History were reviewed and updated.  Review of Systems: All other 10 point review of systems is negative.   Physical Exam:  vitals were not taken for this visit.   Wt Readings from Last 3 Encounters:  05/05/24 171 lb (77.6 kg)  04/14/24 171 lb (77.6 kg)  04/07/24 168 lb 1.3 oz (76.2 kg)    Ocular: Sclerae unicteric, pupils equal, round and reactive to light Ear-nose-throat: Oropharynx clear, dentition fair Lymphatic: No cervical or supraclavicular adenopathy Lungs no rales or rhonchi, good excursion bilaterally Heart regular rate and rhythm, no murmur appreciated Abd soft, nontender, positive bowel sounds MSK no focal spinal tenderness, no joint edema Neuro: non-focal, well-oriented, appropriate  affect Breasts: Deferred   Lab Results  Component Value Date   WBC 4.5 05/05/2024   HGB 11.1 (L) 05/05/2024   HCT 36.6 05/05/2024   MCV 74.2 (L) 05/05/2024   PLT 266 05/05/2024   Lab Results  Component Value Date   FERRITIN 7 (L) 05/05/2024   IRON 52 05/05/2024   TIBC 514 (H) 05/05/2024   UIBC 462 (H) 05/05/2024   IRONPCTSAT 10 (L) 05/05/2024   Lab Results  Component Value Date   RETICCTPCT 0.7 05/05/2024   RBC 4.93 05/05/2024   RBC 4.92 05/05/2024   RETICCTABS 55.1 04/27/2015   No results found for: KPAFRELGTCHN, LAMBDASER, KAPLAMBRATIO No results found for: KIMBERLY LE, IGMSERUM No results found for: STEPHANY CARLOTA BENSON MARKEL EARLA JOANNIE DOC VICK, SPEI   Chemistry      Component Value Date/Time   NA 139 05/05/2024 1246   NA 141 04/14/2024 1541   NA 147 (H) 10/08/2017 1410   NA 141 02/17/2017 1132   K 4.6 05/05/2024 1246   K 4.6 10/08/2017 1410   K 5.3 (H) 02/17/2017 1132   CL 106 05/05/2024 1246   CL 106 10/08/2017 1410   CO2 27 05/05/2024 1246   CO2 30 10/08/2017 1410   CO2 29 02/17/2017 1132   BUN 22 05/05/2024 1246   BUN 16 04/14/2024 1541   BUN 15 10/08/2017 1410   BUN 17.8 02/17/2017 1132   CREATININE 0.85 05/05/2024 1246   CREATININE 1.0 10/08/2017 1410   CREATININE 0.8 02/17/2017 1132      Component Value Date/Time   CALCIUM 9.0 05/05/2024 1246   CALCIUM 9.0 10/08/2017 1410   CALCIUM 9.3 02/17/2017 1132   ALKPHOS 64 05/05/2024 1246   ALKPHOS 59 10/08/2017 1410   ALKPHOS 79 02/17/2017 1132   AST 21 05/05/2024 1246   AST 22 02/17/2017 1132   ALT 13 05/05/2024 1246   ALT 26 10/08/2017 1410   ALT 20 02/17/2017 1132   BILITOT 0.5 05/05/2024 1246   BILITOT 0.39 02/17/2017 1132       Impression and Plan: Ms. Clevinger is a very pleasant 73 yo caucasian female with secondary polycythemia (JAK-2 negative) due to COPD.  Hct is stable at 36%. No phlebotomy needed this visit.  Iron studies pending.   Follow-up in 1 month.  Lauraine Pepper, NP 6/30/20251:13 PM

## 2024-06-01 ENCOUNTER — Other Ambulatory Visit: Payer: Self-pay | Admitting: Family Medicine

## 2024-06-01 DIAGNOSIS — Z1231 Encounter for screening mammogram for malignant neoplasm of breast: Secondary | ICD-10-CM

## 2024-06-17 ENCOUNTER — Ambulatory Visit
Admission: RE | Admit: 2024-06-17 | Discharge: 2024-06-17 | Disposition: A | Discharge status: Home / Self Care | Source: Ambulatory Visit | Attending: Family Medicine | Admitting: Family Medicine

## 2024-06-17 DIAGNOSIS — Z1231 Encounter for screening mammogram for malignant neoplasm of breast: Secondary | ICD-10-CM

## 2024-06-30 ENCOUNTER — Encounter: Payer: Self-pay | Admitting: Family

## 2024-06-30 ENCOUNTER — Inpatient Hospital Stay

## 2024-06-30 ENCOUNTER — Inpatient Hospital Stay (HOSPITAL_BASED_OUTPATIENT_CLINIC_OR_DEPARTMENT_OTHER): Admitting: Family

## 2024-06-30 ENCOUNTER — Inpatient Hospital Stay: Attending: Hematology & Oncology

## 2024-06-30 VITALS — BP 128/63 | HR 76 | Temp 98.8°F | Resp 20 | Ht 60.0 in

## 2024-06-30 VITALS — BP 93/76 | HR 76 | Resp 19

## 2024-06-30 DIAGNOSIS — D509 Iron deficiency anemia, unspecified: Secondary | ICD-10-CM | POA: Insufficient documentation

## 2024-06-30 DIAGNOSIS — D751 Secondary polycythemia: Secondary | ICD-10-CM | POA: Insufficient documentation

## 2024-06-30 DIAGNOSIS — D45 Polycythemia vera: Secondary | ICD-10-CM

## 2024-06-30 DIAGNOSIS — D500 Iron deficiency anemia secondary to blood loss (chronic): Secondary | ICD-10-CM | POA: Diagnosis not present

## 2024-06-30 LAB — CBC WITH DIFFERENTIAL (CANCER CENTER ONLY)
Abs Immature Granulocytes: 0.04 10*3/uL (ref 0.00–0.07)
Basophils Absolute: 0.1 10*3/uL (ref 0.0–0.1)
Basophils Relative: 1 %
Eosinophils Absolute: 0.1 10*3/uL (ref 0.0–0.5)
Eosinophils Relative: 2 %
HCT: 39.4 % (ref 36.0–46.0)
Hemoglobin: 12.2 g/dL (ref 12.0–15.0)
Immature Granulocytes: 1 %
Lymphocytes Relative: 18 %
Lymphs Abs: 1 10*3/uL (ref 0.7–4.0)
MCH: 22.7 pg — ABNORMAL LOW (ref 26.0–34.0)
MCHC: 31 g/dL (ref 30.0–36.0)
MCV: 73.4 fL — ABNORMAL LOW (ref 80.0–100.0)
Monocytes Absolute: 0.4 10*3/uL (ref 0.1–1.0)
Monocytes Relative: 7 %
Neutro Abs: 3.8 10*3/uL (ref 1.7–7.7)
Neutrophils Relative %: 71 %
Platelet Count: 282 10*3/uL (ref 150–400)
RBC: 5.37 MIL/uL — ABNORMAL HIGH (ref 3.87–5.11)
RDW: 17.8 % — ABNORMAL HIGH (ref 11.5–15.5)
WBC Count: 5.4 10*3/uL (ref 4.0–10.5)
nRBC: 0 % (ref 0.0–0.2)

## 2024-06-30 LAB — IRON AND IRON BINDING CAPACITY (CC-WL,HP ONLY)
Iron: 40 ug/dL (ref 28–170)
Saturation Ratios: 7 % — ABNORMAL LOW (ref 10.4–31.8)
TIBC: 554 ug/dL — ABNORMAL HIGH (ref 250–450)
UIBC: 514 ug/dL

## 2024-06-30 LAB — RETICULOCYTES
Immature Retic Fract: 13 % (ref 2.3–15.9)
RBC.: 5.31 MIL/uL — ABNORMAL HIGH (ref 3.87–5.11)
Retic Count, Absolute: 54.2 10*3/uL (ref 19.0–186.0)
Retic Ct Pct: 1 % (ref 0.4–3.1)

## 2024-06-30 LAB — CMP (CANCER CENTER ONLY)
ALT: 19 U/L (ref 0–44)
AST: 29 U/L (ref 15–41)
Albumin: 4.1 g/dL (ref 3.5–5.0)
Alkaline Phosphatase: 71 U/L (ref 38–126)
Anion gap: 10 (ref 5–15)
BUN: 14 mg/dL (ref 8–23)
CO2: 24 mmol/L (ref 22–32)
Calcium: 9.3 mg/dL (ref 8.9–10.3)
Chloride: 106 mmol/L (ref 98–111)
Creatinine: 0.73 mg/dL (ref 0.44–1.00)
GFR, Estimated: >60 mL/min
Glucose, Bld: 101 mg/dL — ABNORMAL HIGH (ref 70–99)
Potassium: 4.4 mmol/L (ref 3.5–5.1)
Sodium: 140 mmol/L (ref 135–145)
Total Bilirubin: 0.4 mg/dL (ref 0.0–1.2)
Total Protein: 6.7 g/dL (ref 6.5–8.1)

## 2024-06-30 LAB — FERRITIN: Ferritin: 10 ng/mL — ABNORMAL LOW (ref 11–307)

## 2024-06-30 MED ORDER — SODIUM CHLORIDE 0.9 % IV SOLN
Freq: Once | INTRAVENOUS | Status: AC
Start: 2024-06-30 — End: 2024-06-30

## 2024-06-30 NOTE — Progress Notes (Signed)
 Natalie Hendrix presents today for phlebotomy per MD orders. Phlebotomy procedure started at 1348 and ended at 1415. 500 grams removed. Patient observed for 30 minutes after procedure without any incident. Patient tolerated procedure well. IV needle removed intact.

## 2024-06-30 NOTE — Progress Notes (Signed)
 Hematology and Oncology Follow Up Visit  Natalie Hendrix [id number] 10-Mar-1951 73 y.o. 06/30/2024   Principle Diagnosis:  Secondary polycythemia - JAK2 negative IDA- felt to be secondary to potential GI bleed- positive Cologuard    Current Therapy:        Phlebotomy to maintain hematocrit less than 38% Plavix  75 mg by mouth daily   Interim History:  [name] is here today for follow-up. She is feeling much better and states that her husband is doing better at home which has been encouraging.  Some itching at times along with the occasional headache.  SOB is stable on continuous supplemental O2.  No fever, chills,n/v, cough, rash, dizziness, chest pain, palpitations, abdominal pain or changes in bowel or bladder habits.  No swelling in her extremities.  Neuropathy unchanged from baseline.  No new falls, no syncope.  She is ambulating with her Rolator.  Appetite and hydration are good. Weight is stable at 170 lbs.   ECOG Performance Status: 1 - Symptomatic but completely ambulatory  Medications:  Allergies as of 06/30/2024       Reactions   Epinephrine Palpitations   Raises heart rate   Flonase  [fluticasone ] Palpitations   Protriptyline Hcl Other (See Comments)   Severe constipation   Tamiflu  [oseltamivir ] Anaphylaxis   Tizanidine Other (See Comments)   panic   Gabapentin Other (See Comments)   arthralgia Other reaction(s): Arthralgia (Joint Pain)   Other Hives, Rash   boiron acteane   Qvar  [beclomethasone] Other (See Comments)   Spiriva  [tiotropium Bromide  Monohydrate] Rash   Tiotropium Rash   Zonegran Other (See Comments)   Mood swings   Advair Diskus [fluticasone -salmeterol] Rash   Baclofen Rash   Chlorzoxazone Rash   Lamotrigine Rash   Levofloxacin Rash   Ropinirole Rash   Verapamil Rash        Medication List        Accurate as of June 30, 2024  1:07 PM. If you have any questions, ask your nurse or doctor.          albuterol  (2.5 MG/3ML) 0.083%  nebulizer solution Commonly known as: PROVENTIL  Take 3 mLs (2.5 mg total) by nebulization every 6 (six) hours as needed for wheezing or shortness of breath.   albuterol  108 (90 Base) MCG/ACT inhaler Commonly known as: ProAir  HFA Inhale 2 puffs into the lungs every 6 (six) hours as needed for wheezing or shortness of breath.   azelastine  0.1 % nasal spray Commonly known as: ASTELIN  Place 1 spray into both nostrils 2 (two) times daily.   beclomethasone 42 MCG/SPRAY nasal spray Commonly known as: BECONASE-AQ 2 puffs each nostril once daily   benzonatate  100 MG capsule Commonly known as: Tessalon  Perles Take 1 capsule (100 mg total) by mouth every 6 (six) hours as needed for cough.   buPROPion  300 MG 24 hr tablet Commonly known as: WELLBUTRIN  XL Take 1 tablet (300 mg total) by mouth daily with breakfast.   CALCIUM 1200 PO Take 1,250 mg by mouth daily.   Centrum Silver tablet Take 1 tablet by mouth daily.   clopidogrel  75 MG tablet Commonly known as: PLAVIX  Take 1 tablet (75 mg total) by mouth daily.   cyclobenzaprine 10 MG tablet Commonly known as: FLEXERIL Take 10 mg by mouth every 8 (eight) hours as needed.   ESTRACE  VAGINAL 0.1 MG/GM vaginal cream Generic drug: estradiol  Place 1 Applicatorful vaginally daily. Small amount each dose per pt   ezetimibe  10 MG tablet Commonly known as: Zetia  TAKE  1 TABLET BY MOUTH DAILY.   fexofenadine  180 MG tablet Commonly known as: Allegra  Allergy  Take 1 tablet (180 mg total) by mouth daily.   Fish Oil 1200 MG Caps Take 1,200 mg by mouth at bedtime.   hydrOXYzine 10 MG tablet Commonly known as: ATARAX Take 10 mg by mouth 3 (three) times daily as needed.   hyoscyamine  0.125 MG Tbdp disintergrating tablet Commonly known as: ANASPAZ  Place 1 tablet (0.125 mg total) under the tongue 2 (two) times daily as needed.   lidocaine  5 % Commonly known as: LIDODERM  Place 1 patch onto the skin as needed (pain). Remove & Discard patch  within 12 hours or as directed by MD   LORazepam  1 MG tablet Commonly known as: ATIVAN  Take 1 tablet (1 mg total) by mouth 2 (two) times daily. May take extra 3rd dose if needed   meclizine  25 MG tablet Commonly known as: ANTIVERT  Take 1 tablet (25 mg total) by mouth 3 (three) times daily as needed for dizziness. 3 month supply   metoprolol  succinate 50 MG 24 hr tablet Commonly known as: TOPROL -XL Take 1 tablet (50 mg total) by mouth daily with breakfast.   metoprolol  succinate 25 MG 24 hr tablet Commonly known as: TOPROL -XL Take 25 mg by mouth daily.   metoprolol  tartrate 50 MG tablet Commonly known as: LOPRESSOR  Take 1 tablet (50 mg total) by mouth once for 1 dose. Take 90-120 minutes prior to scan. Hold for SBP less than 110.   morphine 15 MG tablet Commonly known as: MSIR Take 15 mg by mouth every 4 (four) hours as needed for severe pain.   morphine 30 MG 12 hr tablet Commonly known as: MS CONTIN Take 1 tablet by mouth every 12 (twelve) hours.   Nebulizer Devi Use as directed   OXYGEN  Inhale 2.5 L/hr into the lungs continuous.   pantoprazole  40 MG tablet Commonly known as: PROTONIX  TAKE 1 TABLET (40 MG TOTAL) BY MOUTH DAILY.   Polyethyl Glycol-Propyl Glycol 0.4-0.3 % Soln Apply 1 drop to eye daily as needed (dry eyes).   pravastatin  40 MG tablet Commonly known as: PRAVACHOL  Take 1 tablet (40 mg total) by mouth at bedtime.   promethazine  25 MG tablet Commonly known as: PHENERGAN  Take 25 mg by mouth every 8 (eight) hours as needed for nausea or vomiting.   Zovirax 5 % Generic drug: acyclovir ointment Apply 1 application topically as needed (flair).        Allergies:  Allergies  Allergen Reactions   Epinephrine Palpitations    Raises heart rate    Flonase  [Fluticasone ] Palpitations   Protriptyline Hcl Other (See Comments)    Severe constipation   Tamiflu  [Oseltamivir ] Anaphylaxis   Tizanidine Other (See Comments)    panic   Gabapentin Other (See  Comments)    arthralgia Other reaction(s): Arthralgia (Joint Pain)   Other Hives and Rash    boiron acteane   Qvar  [Beclomethasone] Other (See Comments)   Spiriva  [Tiotropium Bromide  Monohydrate] Rash   Tiotropium Rash   Zonegran Other (See Comments)    Mood swings   Advair Diskus [Fluticasone -Salmeterol] Rash   Baclofen Rash   Chlorzoxazone Rash   Lamotrigine Rash   Levofloxacin Rash   Ropinirole Rash   Verapamil Rash    Past Medical History, Surgical history, Social history, and Family History were reviewed and updated.  Review of Systems: All other 10 point review of systems is negative.   Physical Exam:  vitals were not taken for this visit.  Wt Readings from Last 3 Encounters:  05/31/24 173 lb 12.8 oz (78.8 kg)  05/05/24 171 lb (77.6 kg)  04/14/24 171 lb (77.6 kg)    Ocular: Sclerae unicteric, pupils equal, round and reactive to light Ear-nose-throat: Oropharynx clear, dentition fair Lymphatic: No cervical or supraclavicular adenopathy Lungs no rales or rhonchi, good excursion bilaterally Heart regular rate and rhythm, no murmur appreciated Abd soft, nontender, positive bowel sounds MSK no focal spinal tenderness, no joint edema Neuro: non-focal, well-oriented, appropriate affect Breasts: Deferred   Lab Results  Component Value Date   WBC 5.1 05/31/2024   HGB 11.0 (L) 05/31/2024   HCT 36.5 05/31/2024   MCV 74.0 (L) 05/31/2024   PLT 265 05/31/2024   Lab Results  Component Value Date   FERRITIN 8 (L) 05/31/2024   IRON 31 05/31/2024   TIBC 503 (H) 05/31/2024   UIBC 472 (H) 05/31/2024   IRONPCTSAT 6 (L) 05/31/2024   Lab Results  Component Value Date   RETICCTPCT 1.0 05/31/2024   RBC 4.95 05/31/2024   RETICCTABS 55.1 04/27/2015   No results found for: KPAFRELGTCHN, LAMBDASER, KAPLAMBRATIO No results found for: IGGSERUM, IGA, IGMSERUM No results found for: STEPHANY CARLOTA BENSON MARKEL EARLA JOANNIE DOC VICK,  SPEI   Chemistry      Component Value Date/Time   NA 140 05/31/2024 1254   NA 141 04/14/2024 1541   NA 147 (H) 10/08/2017 1410   NA 141 02/17/2017 1132   K 4.4 05/31/2024 1254   K 4.6 10/08/2017 1410   K 5.3 (H) 02/17/2017 1132   CL 107 05/31/2024 1254   CL 106 10/08/2017 1410   CO2 26 05/31/2024 1254   CO2 30 10/08/2017 1410   CO2 29 02/17/2017 1132   BUN 15 05/31/2024 1254   BUN 16 04/14/2024 1541   BUN 15 10/08/2017 1410   BUN 17.8 02/17/2017 1132   CREATININE 0.76 05/31/2024 1254   CREATININE 1.0 10/08/2017 1410   CREATININE 0.8 02/17/2017 1132      Component Value Date/Time   CALCIUM 8.9 05/31/2024 1254   CALCIUM 9.0 10/08/2017 1410   CALCIUM 9.3 02/17/2017 1132   ALKPHOS 61 05/31/2024 1254   ALKPHOS 59 10/08/2017 1410   ALKPHOS 79 02/17/2017 1132   AST 17 05/31/2024 1254   AST 22 02/17/2017 1132   ALT 12 05/31/2024 1254   ALT 26 10/08/2017 1410   ALT 20 02/17/2017 1132   BILITOT 0.4 05/31/2024 1254   BILITOT 0.39 02/17/2017 1132       Impression and Plan: Ms. Briones is a very pleasant 73 yo caucasian female with secondary polycythemia (JAK-2 negative) due to COPD.  We will proceed with phlebotomy and replacement fluids today for Hct 39.4%.  Iron studies pending.  Follow-up in 1 month.  Lauraine Pepper, NP 7/30/20251:07 PM

## 2024-06-30 NOTE — Patient Instructions (Signed)

## 2024-07-21 ENCOUNTER — Ambulatory Visit: Admitting: Cardiology

## 2024-07-27 ENCOUNTER — Inpatient Hospital Stay

## 2024-07-27 ENCOUNTER — Inpatient Hospital Stay: Admitting: Family

## 2024-07-28 ENCOUNTER — Ambulatory Visit: Admitting: Family

## 2024-07-28 ENCOUNTER — Inpatient Hospital Stay

## 2024-07-28 ENCOUNTER — Ambulatory Visit
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
## 2024-07-29 ENCOUNTER — Ambulatory Visit (INDEPENDENT_AMBULATORY_CARE_PROVIDER_SITE_OTHER): Payer: Medicare HMO | Admitting: Internal Medicine

## 2024-07-29 ENCOUNTER — Encounter: Payer: Self-pay | Admitting: Internal Medicine

## 2024-07-29 VITALS — BP 130/82 | HR 77 | Temp 97.8°F | Ht 67.0 in | Wt 171.2 lb

## 2024-07-29 DIAGNOSIS — J9611 Chronic respiratory failure with hypoxia: Secondary | ICD-10-CM

## 2024-07-29 NOTE — Patient Instructions (Signed)
Ok to  continue current meds  Please call if we can help   

## 2024-08-02 MED ORDER — AZELASTINE HCL 0.1 % NA SOLN: 1.0000 | Freq: Two times a day (BID) | NASAL | 3 refills | Status: DC

## 2024-08-02 MED ORDER — ALBUTEROL SULFATE HFA 108 (90 BASE) MCG/ACT IN AERS: 2.0000 | INHALATION_SPRAY | Freq: Four times a day (QID) | RESPIRATORY_TRACT | 3 refills | Status: DC | PRN

## 2024-08-02 MED ORDER — BECLOMETHASONE DIPROP MONOHYD 42 MCG/SPRAY NA SUSP: NASAL | 3 refills | Status: DC

## 2024-08-03 ENCOUNTER — Ambulatory Visit

## 2024-08-03 ENCOUNTER — Inpatient Hospital Stay: Attending: Hematology & Oncology

## 2024-08-03 ENCOUNTER — Inpatient Hospital Stay (HOSPITAL_BASED_OUTPATIENT_CLINIC_OR_DEPARTMENT_OTHER): Admitting: Family

## 2024-08-03 VITALS — BP 116/53 | HR 88 | Temp 98.7°F | Resp 17 | Wt 175.8 lb

## 2024-08-03 DIAGNOSIS — K922 Gastrointestinal hemorrhage, unspecified: Secondary | ICD-10-CM

## 2024-08-03 DIAGNOSIS — Z7902 Long term (current) use of antithrombotics/antiplatelets: Secondary | ICD-10-CM | POA: Insufficient documentation

## 2024-08-03 DIAGNOSIS — D509 Iron deficiency anemia, unspecified: Secondary | ICD-10-CM | POA: Insufficient documentation

## 2024-08-03 DIAGNOSIS — D45 Polycythemia vera: Secondary | ICD-10-CM

## 2024-08-03 DIAGNOSIS — D751 Secondary polycythemia: Secondary | ICD-10-CM | POA: Insufficient documentation

## 2024-08-03 DIAGNOSIS — J449 Chronic obstructive pulmonary disease, unspecified: Secondary | ICD-10-CM | POA: Diagnosis not present

## 2024-08-03 DIAGNOSIS — D500 Iron deficiency anemia secondary to blood loss (chronic): Secondary | ICD-10-CM

## 2024-08-03 LAB — CBC WITH DIFFERENTIAL (CANCER CENTER ONLY)
Abs Immature Granulocytes: 0.03 K/uL (ref 0.00–0.07)
Basophils Absolute: 0 K/uL (ref 0.0–0.1)
Basophils Relative: 1 %
Eosinophils Absolute: 0.1 K/uL (ref 0.0–0.5)
Eosinophils Relative: 2 %
HCT: 34.5 % — ABNORMAL LOW (ref 36.0–46.0)
Hemoglobin: 10.5 g/dL — ABNORMAL LOW (ref 12.0–15.0)
Immature Granulocytes: 1 %
Lymphocytes Relative: 23 %
Lymphs Abs: 1.1 K/uL (ref 0.7–4.0)
MCH: 22.8 pg — ABNORMAL LOW (ref 26.0–34.0)
MCHC: 30.4 g/dL (ref 30.0–36.0)
MCV: 75 fL — ABNORMAL LOW (ref 80.0–100.0)
Monocytes Absolute: 0.6 K/uL (ref 0.1–1.0)
Monocytes Relative: 12 %
Neutro Abs: 2.9 K/uL (ref 1.7–7.7)
Neutrophils Relative %: 61 %
Platelet Count: 273 K/uL (ref 150–400)
RBC: 4.6 MIL/uL (ref 3.87–5.11)
RDW: 16 % — ABNORMAL HIGH (ref 11.5–15.5)
WBC Count: 4.7 K/uL (ref 4.0–10.5)
nRBC: 0 % (ref 0.0–0.2)

## 2024-08-03 LAB — CMP (CANCER CENTER ONLY)
ALT: 19 U/L (ref 0–44)
AST: 27 U/L (ref 15–41)
Albumin: 3.9 g/dL (ref 3.5–5.0)
Alkaline Phosphatase: 72 U/L (ref 38–126)
Anion gap: 8 (ref 5–15)
BUN: 21 mg/dL (ref 8–23)
CO2: 27 mmol/L (ref 22–32)
Calcium: 8.9 mg/dL (ref 8.9–10.3)
Chloride: 105 mmol/L (ref 98–111)
Creatinine: 0.71 mg/dL (ref 0.44–1.00)
GFR, Estimated: >60 mL/min (ref >60)
Glucose, Bld: 110 mg/dL — ABNORMAL HIGH (ref 70–99)
Potassium: 4.8 mmol/L (ref 3.5–5.1)
Sodium: 139 mmol/L (ref 135–145)
Total Bilirubin: 0.3 mg/dL (ref 0.0–1.2)
Total Protein: 6.2 g/dL — ABNORMAL LOW (ref 6.5–8.1)

## 2024-08-03 LAB — RETICULOCYTES
Immature Retic Fract: 10.5 % (ref 2.3–15.9)
RBC.: 4.72 MIL/uL (ref 3.87–5.11)
Retic Count, Absolute: 54.3 K/uL (ref 19.0–186.0)
Retic Ct Pct: 1.2 % (ref 0.4–3.1)

## 2024-08-03 LAB — IRON AND IRON BINDING CAPACITY (CC-WL,HP ONLY)
Iron: 26 ug/dL — ABNORMAL LOW (ref 28–170)
Saturation Ratios: 5 % — ABNORMAL LOW (ref 10.4–31.8)
TIBC: 522 ug/dL — ABNORMAL HIGH (ref 250–450)
UIBC: 496 ug/dL

## 2024-08-03 LAB — FERRITIN: Ferritin: 8 ng/mL — ABNORMAL LOW (ref 11–307)

## 2024-08-03 NOTE — Progress Notes (Signed)
 Hematology and Oncology Follow Up Visit  Natalie Hendrix [id number] 08/20/1951 73 y.o. 08/03/2024   Principle Diagnosis:  Secondary polycythemia - JAK2 negative IDA - felt to be secondary to potential GI bleed chronic gastritis - positive Cologuard    Current Therapy:        Phlebotomy to maintain hematocrit less than 38% Plavix  75 mg by mouth daily   Interim History:  Natalie Hendrix is here today for follow-up. Hct is 34%, no phlebotomy indicated at this time.  Patient is symptomatic with fatigue. She had to have her sweet therapy dog Cookie to sleep yesterday and is tearful during her visit today.  She got her right hand caught up on their garage door and ended up with a contusive injury. She has splinted her hand and Dr. Camella is following.  No falls or syncope reported. She ambulates with her Rolator for added support.  No fever, chills, cough, rash, chest pain, palpitations, abdominal pain or changes in bowel or bladder habits.  She has a routine follow-up with cardiology and ECHO coming up soon.  SOB stable on 2L supplemental O2.  She notes dizziness at times.  Appetite is poor recently with stress. Hydration is good. Weight is stable at 175 lbs.   ECOG Performance Status: 1 - Symptomatic but completely ambulatory  Medications:  Allergies as of 08/03/2024       Reactions   Epinephrine Palpitations   Raises heart rate   Flonase  [fluticasone ] Palpitations   Protriptyline Hcl Other (See Comments)   Severe constipation   Tamiflu  [oseltamivir ] Anaphylaxis   Tizanidine Other (See Comments)   panic   Gabapentin Other (See Comments)   arthralgia Other reaction(s): Arthralgia (Joint Pain)   Other Hives, Rash   boiron acteane   Qvar  [beclomethasone] Other (See Comments)   Spiriva  [tiotropium Bromide  Monohydrate] Rash   Tiotropium Rash   Zonegran Other (See Comments)   Mood swings   Advair Diskus [fluticasone -salmeterol] Rash   Baclofen Rash   Chlorzoxazone Rash   Lamotrigine  Rash   Levofloxacin Rash   Ropinirole Rash   Verapamil Rash        Medication List        Accurate as of August 03, 2024  1:30 PM. If you have any questions, ask your nurse or doctor.          albuterol  (2.5 MG/3ML) 0.083% nebulizer solution Commonly known as: PROVENTIL  Take 3 mLs (2.5 mg total) by nebulization every 6 (six) hours as needed for wheezing or shortness of breath.   albuterol  108 (90 Base) MCG/ACT inhaler Commonly known as: ProAir  HFA Inhale 2 puffs into the lungs every 6 (six) hours as needed for wheezing or shortness of breath.   azelastine  0.1 % nasal spray Commonly known as: ASTELIN  Place 1 spray into both nostrils 2 (two) times daily.   beclomethasone 42 MCG/SPRAY nasal spray Commonly known as: BECONASE-AQ 2 puffs each nostril once daily   buPROPion  300 MG 24 hr tablet Commonly known as: WELLBUTRIN  XL Take 1 tablet (300 mg total) by mouth daily with breakfast.   CALCIUM 1200 PO Take 1,250 mg by mouth daily.   Centrum Silver tablet Take 1 tablet by mouth daily.   clopidogrel  75 MG tablet Commonly known as: PLAVIX  Take 1 tablet (75 mg total) by mouth daily.   cyclobenzaprine 10 MG tablet Commonly known as: FLEXERIL Take 10 mg by mouth every 8 (eight) hours as needed.   ESTRACE  VAGINAL 0.1 MG/GM vaginal cream Generic drug: estradiol   Place 1 Applicatorful vaginally daily. Small amount each dose per pt   ezetimibe  10 MG tablet Commonly known as: Zetia  TAKE 1 TABLET BY MOUTH DAILY.   fexofenadine  180 MG tablet Commonly known as: Allegra  Allergy  Take 1 tablet (180 mg total) by mouth daily.   Fish Oil 1200 MG Caps Take 1,200 mg by mouth at bedtime.   hydrOXYzine 10 MG tablet Commonly known as: ATARAX Take 10 mg by mouth 3 (three) times daily as needed.   hyoscyamine  0.125 MG Tbdp disintergrating tablet Commonly known as: ANASPAZ  Place 1 tablet (0.125 mg total) under the tongue 2 (two) times daily as needed.   lidocaine  5  % Commonly known as: LIDODERM  Place 1 patch onto the skin as needed (pain). Remove & Discard patch within 12 hours or as directed by MD   LORazepam  1 MG tablet Commonly known as: ATIVAN  Take 1 tablet (1 mg total) by mouth 2 (two) times daily. May take extra 3rd dose if needed   meclizine  25 MG tablet Commonly known as: ANTIVERT  Take 1 tablet (25 mg total) by mouth 3 (three) times daily as needed for dizziness. 3 month supply   metoprolol  succinate 50 MG 24 hr tablet Commonly known as: TOPROL -XL Take 1 tablet (50 mg total) by mouth daily with breakfast.   metoprolol  succinate 25 MG 24 hr tablet Commonly known as: TOPROL -XL Take 25 mg by mouth daily.   metoprolol  tartrate 50 MG tablet Commonly known as: LOPRESSOR  Take 1 tablet (50 mg total) by mouth once for 1 dose. Take 90-120 minutes prior to scan. Hold for SBP less than 110.   morphine 15 MG tablet Commonly known as: MSIR Take 15 mg by mouth every 4 (four) hours as needed for severe pain.   morphine 30 MG 12 hr tablet Commonly known as: MS CONTIN Take 1 tablet by mouth every 12 (twelve) hours.   Nebulizer Devi Use as directed   OXYGEN  Inhale 2.5 L/hr into the lungs continuous.   pantoprazole  40 MG tablet Commonly known as: PROTONIX  TAKE 1 TABLET (40 MG TOTAL) BY MOUTH DAILY.   Polyethyl Glycol-Propyl Glycol 0.4-0.3 % Soln Apply 1 drop to eye daily as needed (dry eyes).   pravastatin  40 MG tablet Commonly known as: PRAVACHOL  Take 1 tablet (40 mg total) by mouth at bedtime.   promethazine  25 MG tablet Commonly known as: PHENERGAN  Take 25 mg by mouth every 8 (eight) hours as needed for nausea or vomiting.   Zovirax 5 % Generic drug: acyclovir ointment Apply 1 application topically as needed (flair).        Allergies:  Allergies  Allergen Reactions   Epinephrine Palpitations    Raises heart rate    Flonase  [Fluticasone ] Palpitations   Protriptyline Hcl Other (See Comments)    Severe constipation    Tamiflu  [Oseltamivir ] Anaphylaxis   Tizanidine Other (See Comments)    panic   Gabapentin Other (See Comments)    arthralgia Other reaction(s): Arthralgia (Joint Pain)   Other Hives and Rash    boiron acteane   Qvar  [Beclomethasone] Other (See Comments)   Spiriva  [Tiotropium Bromide  Monohydrate] Rash   Tiotropium Rash   Zonegran Other (See Comments)    Mood swings   Advair Diskus [Fluticasone -Salmeterol] Rash   Baclofen Rash   Chlorzoxazone Rash   Lamotrigine Rash   Levofloxacin Rash   Ropinirole Rash   Verapamil Rash    Past Medical History, Surgical history, Social history, and Family History were reviewed and updated.  Review of Systems:  All other 10 point review of systems is negative.   Physical Exam:  vitals were not taken for this visit.   Wt Readings from Last 3 Encounters:  07/29/24 171 lb 3.2 oz (77.7 kg)  05/31/24 173 lb 12.8 oz (78.8 kg)  05/05/24 171 lb (77.6 kg)    Ocular: Sclerae unicteric, pupils equal, round and reactive to light Ear-nose-throat: Oropharynx clear, dentition fair Lymphatic: No cervical or supraclavicular adenopathy Lungs no rales or rhonchi, good excursion bilaterally Heart regular rate and rhythm, no murmur appreciated Abd soft, nontender, positive bowel sounds MSK no focal spinal tenderness, no joint edema Neuro: non-focal, well-oriented, appropriate affect Breasts: Deferred   Lab Results  Component Value Date   WBC 4.7 08/03/2024   HGB 10.5 (L) 08/03/2024   HCT 34.5 (L) 08/03/2024   MCV 75.0 (L) 08/03/2024   PLT 273 08/03/2024   Lab Results  Component Value Date   FERRITIN 10 (L) 06/30/2024   IRON 40 06/30/2024   TIBC 554 (H) 06/30/2024   UIBC 514 06/30/2024   IRONPCTSAT 7 (L) 06/30/2024   Lab Results  Component Value Date   RETICCTPCT 1.2 08/03/2024   RBC 4.60 08/03/2024   RBC 4.72 08/03/2024   RETICCTABS 55.1 04/27/2015   No results found for: KPAFRELGTCHN, LAMBDASER, KAPLAMBRATIO No results found for:  KIMBERLY LE, IGMSERUM No results found for: STEPHANY CARLOTA BENSON MARKEL EARLA JOANNIE DOC VICK, SPEI   Chemistry      Component Value Date/Time   NA 140 06/30/2024 1253   NA 141 04/14/2024 1541   NA 147 (H) 10/08/2017 1410   NA 141 02/17/2017 1132   K 4.4 06/30/2024 1253   K 4.6 10/08/2017 1410   K 5.3 (H) 02/17/2017 1132   CL 106 06/30/2024 1253   CL 106 10/08/2017 1410   CO2 24 06/30/2024 1253   CO2 30 10/08/2017 1410   CO2 29 02/17/2017 1132   BUN 14 06/30/2024 1253   BUN 16 04/14/2024 1541   BUN 15 10/08/2017 1410   BUN 17.8 02/17/2017 1132   CREATININE 0.73 06/30/2024 1253   CREATININE 1.0 10/08/2017 1410   CREATININE 0.8 02/17/2017 1132      Component Value Date/Time   CALCIUM 9.3 06/30/2024 1253   CALCIUM 9.0 10/08/2017 1410   CALCIUM 9.3 02/17/2017 1132   ALKPHOS 71 06/30/2024 1253   ALKPHOS 59 10/08/2017 1410   ALKPHOS 79 02/17/2017 1132   AST 29 06/30/2024 1253   AST 22 02/17/2017 1132   ALT 19 06/30/2024 1253   ALT 26 10/08/2017 1410   ALT 20 02/17/2017 1132   BILITOT 0.4 06/30/2024 1253   BILITOT 0.39 02/17/2017 1132       Impression and Plan: Natalie Hendrix is a very pleasant 73 yo caucasian female with secondary polycythemia (JAK-2 negative) due to COPD.  No phlebotomy needed for Hct 34%. Iron studies pending.  Follow-up in 6 weeks.  Lauraine Pepper, NP 9/2/20251:30 PM

## 2024-08-04 ENCOUNTER — Ambulatory Visit (HOSPITAL_COMMUNITY)

## 2024-08-13 ENCOUNTER — Ambulatory Visit (HOSPITAL_COMMUNITY)

## 2024-08-17 ENCOUNTER — Encounter: Payer: Self-pay | Admitting: Internal Medicine

## 2024-08-18 MED ORDER — ALBUTEROL SULFATE (2.5 MG/3ML) 0.083% IN NEBU: 2.5000 mg | INHALATION_SOLUTION | Freq: Four times a day (QID) | RESPIRATORY_TRACT | 3 refills | Status: DC | PRN

## 2024-08-18 MED ORDER — ALBUTEROL SULFATE HFA 108 (90 BASE) MCG/ACT IN AERS: 2.0000 | INHALATION_SPRAY | Freq: Four times a day (QID) | RESPIRATORY_TRACT | 3 refills | Status: DC | PRN

## 2024-08-18 MED ORDER — AZELASTINE HCL 0.1 % NA SOLN: 1.0000 | Freq: Two times a day (BID) | NASAL | 3 refills | Status: DC

## 2024-08-18 MED ORDER — MOMETASONE FUROATE 50 MCG/ACT NA SUSP: 2.0000 | Freq: Every day | NASAL | 11 refills | Status: DC

## 2024-08-18 NOTE — Telephone Encounter (Signed)
 Nasaonex sent instead of Beconase AQ. Other meds refilled.

## 2024-08-18 NOTE — Telephone Encounter (Signed)
**Note De-identified  [place] Obfuscation** Please advise 

## 2024-09-14 ENCOUNTER — Inpatient Hospital Stay: Admitting: Family

## 2024-09-14 ENCOUNTER — Inpatient Hospital Stay: Attending: Hematology & Oncology

## 2024-09-14 ENCOUNTER — Encounter: Payer: Self-pay | Admitting: Family

## 2024-09-14 VITALS — BP 120/61 | HR 71 | Temp 97.6°F | Resp 20 | Ht 65.0 in | Wt 174.0 lb

## 2024-09-14 DIAGNOSIS — K922 Gastrointestinal hemorrhage, unspecified: Secondary | ICD-10-CM | POA: Insufficient documentation

## 2024-09-14 DIAGNOSIS — D751 Secondary polycythemia: Secondary | ICD-10-CM

## 2024-09-14 DIAGNOSIS — Z7902 Long term (current) use of antithrombotics/antiplatelets: Secondary | ICD-10-CM | POA: Insufficient documentation

## 2024-09-14 DIAGNOSIS — D500 Iron deficiency anemia secondary to blood loss (chronic): Secondary | ICD-10-CM | POA: Insufficient documentation

## 2024-09-14 LAB — CMP (CANCER CENTER ONLY)
ALT: 16 U/L (ref 0–44)
AST: 29 U/L (ref 15–41)
Albumin: 4.1 g/dL (ref 3.5–5.0)
Alkaline Phosphatase: 65 U/L (ref 38–126)
Anion gap: 9 (ref 5–15)
BUN: 17 mg/dL (ref 8–23)
CO2: 26 mmol/L (ref 22–32)
Calcium: 9.1 mg/dL (ref 8.9–10.3)
Chloride: 106 mmol/L (ref 98–111)
Creatinine: 0.77 mg/dL (ref 0.44–1.00)
GFR, Estimated: >60 mL/min (ref >60)
Glucose, Bld: 108 mg/dL — ABNORMAL HIGH (ref 70–99)
Potassium: 4.5 mmol/L (ref 3.5–5.1)
Sodium: 141 mmol/L (ref 135–145)
Total Bilirubin: 0.4 mg/dL (ref 0.0–1.2)
Total Protein: 6.5 g/dL (ref 6.5–8.1)

## 2024-09-14 LAB — CBC WITH DIFFERENTIAL (CANCER CENTER ONLY)
Abs Immature Granulocytes: 0 K/uL (ref 0.00–0.07)
Basophils Absolute: 0.1 K/uL (ref 0.0–0.1)
Basophils Relative: 1 %
Eosinophils Absolute: 0.1 K/uL (ref 0.0–0.5)
Eosinophils Relative: 3 %
HCT: 37.6 % (ref 36.0–46.0)
Hemoglobin: 11.2 g/dL — ABNORMAL LOW (ref 12.0–15.0)
Immature Granulocytes: 0 %
Lymphocytes Relative: 27 %
Lymphs Abs: 1.2 K/uL (ref 0.7–4.0)
MCH: 22.2 pg — ABNORMAL LOW (ref 26.0–34.0)
MCHC: 29.8 g/dL — ABNORMAL LOW (ref 30.0–36.0)
MCV: 74.5 fL — ABNORMAL LOW (ref 80.0–100.0)
Monocytes Absolute: 0.5 K/uL (ref 0.1–1.0)
Monocytes Relative: 11 %
Neutro Abs: 2.6 K/uL (ref 1.7–7.7)
Neutrophils Relative %: 58 %
Platelet Count: 270 K/uL (ref 150–400)
RBC: 5.05 MIL/uL (ref 3.87–5.11)
RDW: 15.9 % — ABNORMAL HIGH (ref 11.5–15.5)
WBC Count: 4.5 K/uL (ref 4.0–10.5)
nRBC: 0 % (ref 0.0–0.2)

## 2024-09-14 LAB — RETICULOCYTES
Immature Retic Fract: 13.1 % (ref 2.3–15.9)
RBC.: 4.94 MIL/uL (ref 3.87–5.11)
Retic Count, Absolute: 49.9 K/uL (ref 19.0–186.0)
Retic Ct Pct: 1 % (ref 0.4–3.1)

## 2024-09-14 LAB — IRON AND IRON BINDING CAPACITY (CC-WL,HP ONLY)
Iron: 28 ug/dL (ref 28–170)
Saturation Ratios: 5 % — ABNORMAL LOW (ref 10.4–31.8)
TIBC: 528 ug/dL — ABNORMAL HIGH (ref 250–450)
UIBC: 500 ug/dL

## 2024-09-14 LAB — FERRITIN: Ferritin: 6 ng/mL — ABNORMAL LOW (ref 11–307)

## 2024-09-14 NOTE — Progress Notes (Signed)
 Hematology and Oncology Follow Up Visit  Natalie Hendrix [id number] 09/16/1951 73 y.o. 09/14/2024   Principle Diagnosis:  Secondary polycythemia - JAK2 negative IDA - felt to be secondary to potential GI bleed chronic gastritis - positive Cologuard    Current Therapy:        Phlebotomy to maintain hematocrit less than 38% Plavix  75 mg by mouth daily   Interim History:  Natalie Hendrix is here today for follow-up. She is doing fairly well. She has a wonderful neighbor that is helping her with her husband when needed. Her husband has also had a good week which always makes things easier for her.  She notes fatigue after working out in the yard. This can bother her allergies.  She is doing well on supplemental O2 as needed during the day and at night. SOB has remained stable.  Hct is stable at 37%.  She has occasional dizziness and palpitations.  No obvious blood loss noted. No bruising or petechiae.  No fever, chills, n/v, cough, rash, abdominal pain or changes in bowel or bladder habits.  No swelling in her extremities.  Neuropathy in her lower extremities unchanged from baseline.  She has the occasional mechanical fall but states that she has not been injured. She uses her Rolator when out for added support and she has a walker at home.  No syncope reported.  Appetite and hydration are good. Weight is stable at 174 lbs.   ECOG Performance Status: 1 - Symptomatic but completely ambulatory  Medications:  Allergies as of 09/14/2024       Reactions   Epinephrine Palpitations   Raises heart rate   Flonase  [fluticasone ] Palpitations   Protriptyline Hcl Other (See Comments)   Severe constipation   Tamiflu  [oseltamivir ] Anaphylaxis   Tizanidine Other (See Comments)   panic   Gabapentin Other (See Comments)   arthralgia Other reaction(s): Arthralgia (Joint Pain)   Other Hives, Rash   boiron acteane   Qvar  [beclomethasone] Other (See Comments)   Spiriva  [tiotropium Bromide ] Rash    Tiotropium Rash   Zonegran Other (See Comments)   Mood swings   Advair Diskus [fluticasone -salmeterol] Rash   Baclofen Rash   Chlorzoxazone Rash   Lamotrigine Rash   Levofloxacin Rash   Ropinirole Rash   Verapamil Rash        Medication List        Accurate as of September 14, 2024 12:54 PM. If you have any questions, ask your nurse or doctor.          albuterol  108 (90 Base) MCG/ACT inhaler Commonly known as: ProAir  HFA Inhale 2 puffs into the lungs every 6 (six) hours as needed for wheezing or shortness of breath.   albuterol  (2.5 MG/3ML) 0.083% nebulizer solution Commonly known as: PROVENTIL  Take 3 mLs (2.5 mg total) by nebulization every 6 (six) hours as needed for wheezing or shortness of breath.   azelastine  0.1 % nasal spray Commonly known as: ASTELIN  Place 1 spray into both nostrils 2 (two) times daily.   buPROPion  300 MG 24 hr tablet Commonly known as: WELLBUTRIN  XL Take 1 tablet (300 mg total) by mouth daily with breakfast.   CALCIUM 1200 PO Take 1,250 mg by mouth daily.   Centrum Silver tablet Take 1 tablet by mouth daily.   clopidogrel  75 MG tablet Commonly known as: PLAVIX  Take 1 tablet (75 mg total) by mouth daily.   cyclobenzaprine 10 MG tablet Commonly known as: FLEXERIL Take 10 mg by mouth every 8 (eight)  hours as needed.   ESTRACE  VAGINAL 0.1 MG/GM Crea vaginal cream Generic drug: estradiol  Place 1 Applicatorful vaginally daily. Small amount each dose per pt   ezetimibe  10 MG tablet Commonly known as: Zetia  TAKE 1 TABLET BY MOUTH DAILY.   fexofenadine  180 MG tablet Commonly known as: Allegra  Allergy  Take 1 tablet (180 mg total) by mouth daily.   Fish Oil 1200 MG Caps Take 1,200 mg by mouth at bedtime.   hydrOXYzine 10 MG tablet Commonly known as: ATARAX Take 10 mg by mouth 3 (three) times daily as needed.   hyoscyamine  0.125 MG Tbdp disintergrating tablet Commonly known as: ANASPAZ  Place 1 tablet (0.125 mg total) under the  tongue 2 (two) times daily as needed.   lidocaine  5 % Commonly known as: LIDODERM  Place 1 patch onto the skin as needed (pain). Remove & Discard patch within 12 hours or as directed by MD   LORazepam  1 MG tablet Commonly known as: ATIVAN  Take 1 tablet (1 mg total) by mouth 2 (two) times daily. May take extra 3rd dose if needed   meclizine  25 MG tablet Commonly known as: ANTIVERT  Take 1 tablet (25 mg total) by mouth 3 (three) times daily as needed for dizziness. 3 month supply   metoprolol  succinate 50 MG 24 hr tablet Commonly known as: TOPROL -XL Take 1 tablet (50 mg total) by mouth daily with breakfast.   metoprolol  succinate 25 MG 24 hr tablet Commonly known as: TOPROL -XL Take 25 mg by mouth daily.   metoprolol  tartrate 50 MG tablet Commonly known as: LOPRESSOR  Take 1 tablet (50 mg total) by mouth once for 1 dose. Take 90-120 minutes prior to scan. Hold for SBP less than 110.   mometasone  50 MCG/ACT nasal spray Commonly known as: NASONEX  Place 2 sprays into the nose daily.   morphine 15 MG tablet Commonly known as: MSIR Take 15 mg by mouth every 4 (four) hours as needed for severe pain.   morphine 30 MG 12 hr tablet Commonly known as: MS CONTIN Take 1 tablet by mouth every 12 (twelve) hours.   Nebulizer Devi Use as directed   OXYGEN  Inhale 2.5 L/hr into the lungs continuous.   pantoprazole  40 MG tablet Commonly known as: PROTONIX  TAKE 1 TABLET (40 MG TOTAL) BY MOUTH DAILY.   Polyethyl Glycol-Propyl Glycol 0.4-0.3 % Soln Apply 1 drop to eye daily as needed (dry eyes).   pravastatin  40 MG tablet Commonly known as: PRAVACHOL  Take 1 tablet (40 mg total) by mouth at bedtime.   promethazine  25 MG tablet Commonly known as: PHENERGAN  Take 25 mg by mouth every 8 (eight) hours as needed for nausea or vomiting.   Zovirax 5 % Generic drug: acyclovir ointment Apply 1 application topically as needed (flair).        Allergies:  Allergies  Allergen Reactions    Epinephrine Palpitations    Raises heart rate    Flonase  [Fluticasone ] Palpitations   Protriptyline Hcl Other (See Comments)    Severe constipation   Tamiflu  [Oseltamivir ] Anaphylaxis   Tizanidine Other (See Comments)    panic   Gabapentin Other (See Comments)    arthralgia Other reaction(s): Arthralgia (Joint Pain)   Other Hives and Rash    boiron acteane   Qvar  [Beclomethasone] Other (See Comments)   Spiriva  [Tiotropium Bromide ] Rash   Tiotropium Rash   Zonegran Other (See Comments)    Mood swings   Advair Diskus [Fluticasone -Salmeterol] Rash   Baclofen Rash   Chlorzoxazone Rash   Lamotrigine Rash  Levofloxacin Rash   Ropinirole Rash   Verapamil Rash    Past Medical History, Surgical history, Social history, and Family History were reviewed and updated.  Review of Systems: All other 10 point review of systems is negative.   Physical Exam:  vitals were not taken for this visit.   Wt Readings from Last 3 Encounters:  08/03/24 175 lb 12.8 oz (79.7 kg)  07/29/24 171 lb 3.2 oz (77.7 kg)  05/31/24 173 lb 12.8 oz (78.8 kg)    Ocular: Sclerae unicteric, pupils equal, round and reactive to light Ear-nose-throat: Oropharynx clear, dentition fair Lymphatic: No cervical or supraclavicular adenopathy Lungs no rales or rhonchi, good excursion bilaterally Heart regular rate and rhythm, no murmur appreciated Abd soft, nontender, positive bowel sounds MSK no focal spinal tenderness, no joint edema Neuro: non-focal, well-oriented, appropriate affect Breasts: Deferred   Lab Results  Component Value Date   WBC 4.7 08/03/2024   HGB 10.5 (L) 08/03/2024   HCT 34.5 (L) 08/03/2024   MCV 75.0 (L) 08/03/2024   PLT 273 08/03/2024   Lab Results  Component Value Date   FERRITIN 8 (L) 08/03/2024   IRON 26 (L) 08/03/2024   TIBC 522 (H) 08/03/2024   UIBC 496 08/03/2024   IRONPCTSAT 5 (L) 08/03/2024   Lab Results  Component Value Date   RETICCTPCT 1.2 08/03/2024   RBC 4.60  08/03/2024   RBC 4.72 08/03/2024   RETICCTABS 55.1 04/27/2015   No results found for: KPAFRELGTCHN, LAMBDASER, KAPLAMBRATIO No results found for: KIMBERLY LE, IGMSERUM No results found for: STEPHANY CARLOTA BENSON MARKEL EARLA JOANNIE DOC VICK, SPEI   Chemistry      Component Value Date/Time   NA 139 08/03/2024 1317   NA 141 04/14/2024 1541   NA 147 (H) 10/08/2017 1410   NA 141 02/17/2017 1132   K 4.8 08/03/2024 1317   K 4.6 10/08/2017 1410   K 5.3 (H) 02/17/2017 1132   CL 105 08/03/2024 1317   CL 106 10/08/2017 1410   CO2 27 08/03/2024 1317   CO2 30 10/08/2017 1410   CO2 29 02/17/2017 1132   BUN 21 08/03/2024 1317   BUN 16 04/14/2024 1541   BUN 15 10/08/2017 1410   BUN 17.8 02/17/2017 1132   CREATININE 0.71 08/03/2024 1317   CREATININE 1.0 10/08/2017 1410   CREATININE 0.8 02/17/2017 1132      Component Value Date/Time   CALCIUM 8.9 08/03/2024 1317   CALCIUM 9.0 10/08/2017 1410   CALCIUM 9.3 02/17/2017 1132   ALKPHOS 72 08/03/2024 1317   ALKPHOS 59 10/08/2017 1410   ALKPHOS 79 02/17/2017 1132   AST 27 08/03/2024 1317   AST 22 02/17/2017 1132   ALT 19 08/03/2024 1317   ALT 26 10/08/2017 1410   ALT 20 02/17/2017 1132   BILITOT 0.3 08/03/2024 1317   BILITOT 0.39 02/17/2017 1132       Impression and Plan: Ms. Son is a very pleasant 26 yo caucasian female with secondary polycythemia (JAK-2 negative) due to COPD.  No phlebotomy needed for Hct 37%. Iron studies pending.  Follow-up in 6 weeks.  Lauraine Pepper, NP 10/14/202512:54 PM

## 2024-09-20 ENCOUNTER — Ambulatory Visit (HOSPITAL_COMMUNITY)
Admission: RE | Admit: 2024-09-20 | Discharge: 2024-09-20 | Disposition: A | Discharge status: Home / Self Care | Source: Ambulatory Visit | Attending: Cardiology | Admitting: Cardiology

## 2024-09-20 DIAGNOSIS — I080 Rheumatic disorders of both mitral and aortic valves: Secondary | ICD-10-CM | POA: Diagnosis not present

## 2024-09-20 DIAGNOSIS — R002 Palpitations: Secondary | ICD-10-CM | POA: Insufficient documentation

## 2024-09-20 DIAGNOSIS — E782 Mixed hyperlipidemia: Secondary | ICD-10-CM | POA: Diagnosis present

## 2024-09-20 DIAGNOSIS — I10 Essential (primary) hypertension: Secondary | ICD-10-CM | POA: Diagnosis present

## 2024-09-20 DIAGNOSIS — I340 Nonrheumatic mitral (valve) insufficiency: Secondary | ICD-10-CM | POA: Insufficient documentation

## 2024-09-20 LAB — ECHOCARDIOGRAM COMPLETE
Area-P 1/2: 4.36 cm2
S' Lateral: 2.31 cm

## 2024-09-21 ENCOUNTER — Ambulatory Visit: Payer: Self-pay | Admitting: Emergency Medicine

## 2024-09-22 ENCOUNTER — Ambulatory Visit: Attending: Cardiology | Admitting: Cardiology

## 2024-09-22 ENCOUNTER — Encounter: Payer: Self-pay | Admitting: Cardiology

## 2024-09-22 VITALS — BP 124/68 | HR 75 | Ht 67.0 in | Wt 172.8 lb

## 2024-09-22 DIAGNOSIS — R0609 Other forms of dyspnea: Secondary | ICD-10-CM

## 2024-09-22 DIAGNOSIS — R002 Palpitations: Secondary | ICD-10-CM | POA: Diagnosis not present

## 2024-09-22 DIAGNOSIS — I10 Essential (primary) hypertension: Secondary | ICD-10-CM

## 2024-09-22 DIAGNOSIS — R931 Abnormal findings on diagnostic imaging of heart and coronary circulation: Secondary | ICD-10-CM

## 2024-09-22 NOTE — Progress Notes (Signed)
 Cardiology Office Note:  .   Date:  09/26/2024  ID:  Natalie Hendrix, DOB 1951-07-04, MRN [id number] PCP: Burney Darice CROME, MD  [facility] Providers Cardiologist:  Alm Clay, MD     Chief Complaint  Patient presents with   Follow-up    To discuss test results    Patient Profile: .     Natalie Hendrix is a 73 y.o. female with a PMH notable for polycythemia vera, prior CVA, COPD with reactive airway disease component on chronic oxygen  as well as hyperlipidemia who presents here for close follow-up to discuss results of testing to evaluate chest discomfort and dyspnea at the request of Burney Darice CROME, MD.     I saw Natalie Hendrix for the first time in about 4 years on Apr 14, 2024 for evaluation of chest pain and dyspnea.  She was also noting palpitations so we decided to evaluate her with a coronary CTA for definitive answer about coronary disease along with an echocardiogram to assess EF and pressure/diastolic parameters.  We then ordered a monitor as well to evaluate for arrhythmias.  She then presents to discuss results.  Subjective  Discussed the use of AI scribe software for clinical note transcription with the patient, who gave verbal consent to proceed.  History of Present Illness Natalie Hendrix is a 73 year old female with hypertension, hyperlipidemia, and COPD who presents with palpitations and dyspnea on exertion.  She experiences palpitations and dyspnea on exertion. In May 2025, she wore a Zio patch monitor, which showed predominantly sinus rhythm with a rate range of 54 to 154 beats per minute and an average of 74 beats per minute. There were two atrial runs of 4 and 5 beats at a rate of 150 beats per minute, with rare PACs and PVCs, and no sustained arrhythmias.  Her most recent cardiac evaluation included a coronary CTA on May 04, 2024, which showed a calcium score of 14 and a total plaque volume of 48 mm. The scan revealed normal right dominance with minimal  plaque in the osseous LAD, less than 24%.  She underwent an echocardiogram on September 20, 2024, which showed a normal ejection fraction of 55-60% with normal wall motion. There was mild basal septal hypertrophy, mild to moderate mitral regurgitation, aortic valve sclerosis without stenosis, and mildly elevated right atrial pressures of 8 mmHg.     Objective   Medications reviewed.  Pertinent CV medications include clopidogrel  75 mg daily (related to PCV), Zetia  10 mg and pravastatin  40 mg daily, fish oil 1200 mg daily, Toprol  succinate total 75 mg (25 mg every afternoon and 50 mg every morning),   Studies Reviewed: .        Results RADIOLOGY Coronary CTA: Coronary calcium score 14, total plaque volume 48 mm, normal right dominance, <24% plaque in osseous LAD (05/04/2024)  DIAGNOSTIC Echocardiogram: Ejection fraction 55-60%, normal wall motion, mild basal septal hypertrophy, normal diastolic parameters, normal right-sided pressures and function, mild to moderate mitral regurgitation, aortic valve sclerosis, no stenosis, mildly elevated right atrial pressure 8 mmHg (09/20/2024) Zio Patch Monitor: Predominantly sinus rhythm, rate range 54-154 bpm, average 74 bpm, two atrial runs of 4 and 5 beats at 150 bpm, rare premature atrial contractions (PACs) and premature ventricular contractions (PVCs), no sustained arrhythmias (04/2024)   Risk Assessment/Calculations:         Physical Exam:   VS:  BP 124/68 (BP Location: Left Arm, Patient Position: Sitting, Cuff Size: Normal)  Pulse 75   Ht 5' 7 (1.702 m)   Wt 172 lb 12.8 oz (78.4 kg)   SpO2 97%   BMI 27.06 kg/m    Wt Readings from Last 3 Encounters:  09/22/24 172 lb 12.8 oz (78.4 kg)  09/14/24 174 lb (78.9 kg)  08/03/24 175 lb 12.8 oz (79.7 kg)      GEN: Chronic ill-appearing, somewhat disabled woman who with the walker and home oxygen .  However she is nontoxic/in no acute distress. NECK: No JVD; No carotid bruits CARDIAC: Normal  S1, S2; RRR, no murmurs, rubs, gallops RESPIRATORY:  Clear to auscultation but with still some residual expiratory wheezing but no rales or rhonchi.  nonlabored, good air movement.  On home oxygen  ABDOMEN: Soft, non-tender, non-distended EXTREMITIES:  No edema; No deformity      ASSESSMENT AND PLAN: .    Problem List Items Addressed This Visit       Cardiology Problems   Essential hypertension - Primary (Chronic)   She is mostly on the Toprol  for her palpitation symptoms but it is controlling her hypertension.  Continue Toprol  XL split with 50 mg morning, 25 mg p.m.        Other   Agatston CAC score, <100   Coronary CTA showed calcium score of 14, total plaque volume 48 mm, minimal plaque in osseous LAD. Echocardiogram showed normal EF, mild basal septal hypertrophy, mild to moderate mitral regurgitation, aortic valve sclerosis, mildly elevated right atrial pressures.      DOE (dyspnea on exertion) (Chronic)   Pretty much multifactorial.  She is on home oxygen  which probably explains most fit, but cardiac evaluation was essentially normal with normal echocardiogram results as far as normal EF and normal filling pressures.  Also Coronary CTA) which excludes occlusive CAD.  Cannot exclude microvascular disease, however she is not having any active anginal type symptoms and therefore I would not pursue further evaluation.      Palpitations (Chronic)   Pretty reassuring monitor.  No concerning issues.  Rare PACs and PVCs with with short 4-5 beat atrial runs.  Very well-controlled on current dose of beta-blocker -She is currently taking Toprol -XL 50 mg in the morning and 25 mg in the evening.  Doing well.  No change.              Follow-Up: Return if symptoms worsen or fail to improve.    Signed, Alm MICAEL Clay, MD, MS Alm Clay, M.D., M.S. Interventional Cardiologist  [facility] Pager # [phone]

## 2024-09-22 NOTE — Patient Instructions (Signed)
 Medication Instructions:  No changes   *If you need a refill on your cardiac medications before your next appointment, please call your pharmacy*   Lab Work: Not needed   Testing/Procedures:  Not needed  Follow-Up: At [facility], you and your health needs are our priority.  As part of our continuing mission to provide you with exceptional heart care, we have created designated Provider Care Teams.  These Care Teams include your primary Cardiologist (physician) and Advanced Practice Providers (APPs -  Physician Assistants and Nurse Practitioners) who all work together to provide you with the care you need, when you need it.     Your next appointment:   As needed   The format for your next appointment:   In Person  Provider:   Bryan Lemma, MD

## 2024-09-26 ENCOUNTER — Encounter: Payer: Self-pay | Admitting: Cardiology

## 2024-09-26 DIAGNOSIS — R931 Abnormal findings on diagnostic imaging of heart and coronary circulation: Secondary | ICD-10-CM | POA: Insufficient documentation

## 2024-09-26 NOTE — Assessment & Plan Note (Signed)
 Pretty reassuring monitor.  No concerning issues.  Rare PACs and PVCs with with short 4-5 beat atrial runs.  Very well-controlled on current dose of beta-blocker -She is currently taking Toprol -XL 50 mg in the morning and 25 mg in the evening.  Doing well.  No change.

## 2024-09-26 NOTE — Assessment & Plan Note (Signed)
 Pretty much multifactorial.  She is on home oxygen  which probably explains most fit, but cardiac evaluation was essentially normal with normal echocardiogram results as far as normal EF and normal filling pressures.  Also Coronary CTA) which excludes occlusive CAD.  Cannot exclude microvascular disease, however she is not having any active anginal type symptoms and therefore I would not pursue further evaluation.

## 2024-09-26 NOTE — Assessment & Plan Note (Addendum)
 She is mostly on the Toprol  for her palpitation symptoms but it is controlling her hypertension.  Continue Toprol  XL split with 50 mg morning, 25 mg p.m.

## 2024-09-26 NOTE — Assessment & Plan Note (Signed)
 Coronary CTA showed calcium score of 14, total plaque volume 48 mm, minimal plaque in osseous LAD. Echocardiogram showed normal EF, mild basal septal hypertrophy, mild to moderate mitral regurgitation, aortic valve sclerosis, mildly elevated right atrial pressures.

## 2024-09-29 ENCOUNTER — Encounter: Payer: Self-pay | Admitting: Family

## 2024-10-12 ENCOUNTER — Inpatient Hospital Stay: Admitting: Family

## 2024-10-12 ENCOUNTER — Other Ambulatory Visit: Payer: Self-pay | Admitting: Family

## 2024-10-12 ENCOUNTER — Encounter: Payer: Self-pay | Admitting: Family

## 2024-10-12 ENCOUNTER — Inpatient Hospital Stay

## 2024-10-12 ENCOUNTER — Inpatient Hospital Stay: Attending: Hematology & Oncology

## 2024-10-12 VITALS — BP 127/60 | HR 71 | Temp 98.3°F | Resp 20 | Ht 67.0 in | Wt 173.8 lb

## 2024-10-12 VITALS — BP 104/77 | HR 74 | Resp 18

## 2024-10-12 DIAGNOSIS — J449 Chronic obstructive pulmonary disease, unspecified: Secondary | ICD-10-CM | POA: Diagnosis not present

## 2024-10-12 DIAGNOSIS — Z8673 Personal history of transient ischemic attack (TIA), and cerebral infarction without residual deficits: Secondary | ICD-10-CM

## 2024-10-12 DIAGNOSIS — D500 Iron deficiency anemia secondary to blood loss (chronic): Secondary | ICD-10-CM

## 2024-10-12 DIAGNOSIS — L299 Pruritus, unspecified: Secondary | ICD-10-CM

## 2024-10-12 DIAGNOSIS — D751 Secondary polycythemia: Secondary | ICD-10-CM | POA: Insufficient documentation

## 2024-10-12 DIAGNOSIS — D509 Iron deficiency anemia, unspecified: Secondary | ICD-10-CM | POA: Insufficient documentation

## 2024-10-12 DIAGNOSIS — D45 Polycythemia vera: Secondary | ICD-10-CM

## 2024-10-12 DIAGNOSIS — K589 Irritable bowel syndrome without diarrhea: Secondary | ICD-10-CM

## 2024-10-12 LAB — CMP (CANCER CENTER ONLY)
ALT: 24 U/L (ref 0–44)
AST: 32 U/L (ref 15–41)
Albumin: 4.1 g/dL (ref 3.5–5.0)
Alkaline Phosphatase: 67 U/L (ref 38–126)
Anion gap: 10 (ref 5–15)
BUN: 16 mg/dL (ref 8–23)
CO2: 25 mmol/L (ref 22–32)
Calcium: 9.1 mg/dL (ref 8.9–10.3)
Chloride: 107 mmol/L (ref 98–111)
Creatinine: 0.76 mg/dL (ref 0.44–1.00)
GFR, Estimated: >60 mL/min (ref >60)
Glucose, Bld: 102 mg/dL — ABNORMAL HIGH (ref 70–99)
Potassium: 4.3 mmol/L (ref 3.5–5.1)
Sodium: 143 mmol/L (ref 135–145)
Total Bilirubin: 0.4 mg/dL (ref 0.0–1.2)
Total Protein: 6.5 g/dL (ref 6.5–8.1)

## 2024-10-12 LAB — IRON AND IRON BINDING CAPACITY (CC-WL,HP ONLY)
Iron: 29 ug/dL (ref 28–170)
Saturation Ratios: 5 % — ABNORMAL LOW (ref 10.4–31.8)
TIBC: 535 ug/dL — ABNORMAL HIGH (ref 250–450)
UIBC: 506 ug/dL

## 2024-10-12 LAB — CBC WITH DIFFERENTIAL (CANCER CENTER ONLY)
Abs Immature Granulocytes: 0.02 K/uL (ref 0.00–0.07)
Basophils Absolute: 0 K/uL (ref 0.0–0.1)
Basophils Relative: 1 %
Eosinophils Absolute: 0.1 K/uL (ref 0.0–0.5)
Eosinophils Relative: 2 %
HCT: 38.2 % (ref 36.0–46.0)
Hemoglobin: 11.6 g/dL — ABNORMAL LOW (ref 12.0–15.0)
Immature Granulocytes: 0 %
Lymphocytes Relative: 21 %
Lymphs Abs: 1.2 K/uL (ref 0.7–4.0)
MCH: 22.6 pg — ABNORMAL LOW (ref 26.0–34.0)
MCHC: 30.4 g/dL (ref 30.0–36.0)
MCV: 74.3 fL — ABNORMAL LOW (ref 80.0–100.0)
Monocytes Absolute: 0.5 K/uL (ref 0.1–1.0)
Monocytes Relative: 8 %
Neutro Abs: 4 K/uL (ref 1.7–7.7)
Neutrophils Relative %: 68 %
Platelet Count: 276 K/uL (ref 150–400)
RBC: 5.14 MIL/uL — ABNORMAL HIGH (ref 3.87–5.11)
RDW: 17.3 % — ABNORMAL HIGH (ref 11.5–15.5)
WBC Count: 5.8 K/uL (ref 4.0–10.5)
nRBC: 0 % (ref 0.0–0.2)

## 2024-10-12 LAB — RETICULOCYTES
Immature Retic Fract: 13.2 % (ref 2.3–15.9)
RBC.: 5.09 MIL/uL (ref 3.87–5.11)
Retic Count, Absolute: 46.3 K/uL (ref 19.0–186.0)
Retic Ct Pct: 0.9 % (ref 0.4–3.1)

## 2024-10-12 LAB — FERRITIN: Ferritin: 7 ng/mL — ABNORMAL LOW (ref 11–307)

## 2024-10-12 MED ORDER — SODIUM CHLORIDE 0.9 % IV SOLN: Freq: Once | INTRAVENOUS | Status: AC

## 2024-10-12 MED ORDER — HYOSCYAMINE SULFATE 0.125 MG PO TBDP
0.1250 mg | ORAL_TABLET | Freq: Two times a day (BID) | ORAL | 1 refills | Status: AC | PRN
Start: 2024-10-12 — End: ?

## 2024-10-12 MED ORDER — CLOPIDOGREL BISULFATE 75 MG PO TABS: 75.0000 mg | ORAL_TABLET | Freq: Every day | ORAL | 3 refills | Status: AC

## 2024-10-12 MED ORDER — HYDROXYZINE HCL 10 MG PO TABS: 10.0000 mg | ORAL_TABLET | Freq: Three times a day (TID) | ORAL | 1 refills | Status: AC | PRN

## 2024-10-12 NOTE — Progress Notes (Signed)
 Natalie Hendrix presents today for phlebotomy per MD orders. Phlebotomy procedure started at 1410 and ended at 1428 via 20 gauge angio cath to right forearm. 555 grams removed without difficulty.  500 ml/'s NS given over one hour per pt.'s request.  Patient observed for 30 minutes after procedure without any incident.  Snack and drink taken. Patient tolerated procedure well. IV needle removed intact.

## 2024-10-12 NOTE — Progress Notes (Signed)
 Hematology and Oncology Follow Up Visit  Natalie Hendrix [id number] 07-13-1951 73 y.o. 10/12/2024   Principle Diagnosis:   Secondary polycythemia - JAK2 negative IDA - felt to be secondary to potential GI bleed chronic gastritis - positive Cologuard    Current Therapy:        Phlebotomy to maintain hematocrit less than 38% Plavix  75 mg by mouth daily   Interim History:  [name] is here today for follow-up and phlebotomy. Hct is 38.2%. She is doing well and has been staying busy at home. She admits that she is probably doing too much.  She has noted headaches, itching and joint pain.  Chest pain and palpitations with over exertion also unchanged from baseline. She states that cardiology is aware.  No fever, chills, n/v, cough, rash, dizziness, abdominal pain or changes in bowel or bladder habits.  No swelling, tenderness, numbness or tingling in her extremities at this time.  No new falls, no syncope.  No blood loss noted. No bruising or petechiae.  Appetite and hydration are good. Weight is stable at 173 lbs.   ECOG Performance Status: 1 - Symptomatic but completely ambulatory  Medications:  Allergies as of 10/12/2024       Reactions   Epinephrine Palpitations   Raises heart rate   Flonase  [fluticasone ] Palpitations   Protriptyline Hcl Other (See Comments)   Severe constipation   Tamiflu  [oseltamivir ] Anaphylaxis   Tizanidine Other (See Comments)   panic   Gabapentin Other (See Comments)   arthralgia Other reaction(s): Arthralgia (Joint Pain)   Other Hives, Rash   boiron acteane   Qvar  [beclomethasone] Other (See Comments)   Spiriva  [tiotropium Bromide ] Rash   Tiotropium Rash   Zonegran Other (See Comments)   Mood swings   Advair Diskus [fluticasone -salmeterol] Rash   Baclofen Rash   Chlorzoxazone Rash   Lamotrigine Rash   Levofloxacin Rash   Ropinirole Rash   Tiotropium Bromide  Rash   Other Reaction(s): Not available tiotropium bromide    Verapamil Rash         Medication List        Accurate as of October 12, 2024  1:26 PM. If you have any questions, ask your nurse or doctor.          albuterol  108 (90 Base) MCG/ACT inhaler Commonly known as: ProAir  HFA Inhale 2 puffs into the lungs every 6 (six) hours as needed for wheezing or shortness of breath.   albuterol  (2.5 MG/3ML) 0.083% nebulizer solution Commonly known as: PROVENTIL  Take 3 mLs (2.5 mg total) by nebulization every 6 (six) hours as needed for wheezing or shortness of breath.   azelastine  0.1 % nasal spray Commonly known as: ASTELIN  Place 1 spray into both nostrils 2 (two) times daily.   buPROPion  300 MG 24 hr tablet Commonly known as: WELLBUTRIN  XL Take 1 tablet (300 mg total) by mouth daily with breakfast.   CALCIUM 1200 PO Take 1,250 mg by mouth daily.   Centrum Silver tablet Take 1 tablet by mouth daily.   clopidogrel  75 MG tablet Commonly known as: PLAVIX  Take 1 tablet (75 mg total) by mouth daily.   cyclobenzaprine 10 MG tablet Commonly known as: FLEXERIL Take 10 mg by mouth every 8 (eight) hours as needed.   ESTRACE  VAGINAL 0.1 MG/GM Crea vaginal cream Generic drug: estradiol  Place 1 Applicatorful vaginally daily. Small amount each dose per pt   ezetimibe  10 MG tablet Commonly known as: Zetia  TAKE 1 TABLET BY MOUTH DAILY.   fexofenadine  180  MG tablet Commonly known as: Allegra  Allergy  Take 1 tablet (180 mg total) by mouth daily.   Fish Oil 1200 MG Caps Take 1,200 mg by mouth at bedtime.   hydrOXYzine 10 MG tablet Commonly known as: ATARAX Take 10 mg by mouth 3 (three) times daily as needed.   hyoscyamine  0.125 MG Tbdp disintergrating tablet Commonly known as: ANASPAZ  Place 1 tablet (0.125 mg total) under the tongue 2 (two) times daily as needed.   lidocaine  5 % Commonly known as: LIDODERM  Place 1 patch onto the skin as needed (pain). Remove & Discard patch within 12 hours or as directed by MD   LORazepam  1 MG tablet Commonly known  as: ATIVAN  Take 1 tablet (1 mg total) by mouth 2 (two) times daily. May take extra 3rd dose if needed   meclizine  25 MG tablet Commonly known as: ANTIVERT  Take 1 tablet (25 mg total) by mouth 3 (three) times daily as needed for dizziness. 3 month supply   metoprolol  succinate 50 MG 24 hr tablet Commonly known as: TOPROL -XL Take 1 tablet (50 mg total) by mouth daily with breakfast.   metoprolol  succinate 25 MG 24 hr tablet Commonly known as: TOPROL -XL Take 25 mg by mouth daily.   mometasone  50 MCG/ACT nasal spray Commonly known as: NASONEX  Place 2 sprays into the nose daily.   morphine 15 MG tablet Commonly known as: MSIR Take 15 mg by mouth every 4 (four) hours as needed for severe pain.   morphine 30 MG 12 hr tablet Commonly known as: MS CONTIN Take 1 tablet by mouth every 12 (twelve) hours.   Nebulizer Devi Use as directed   OXYGEN  Inhale 2.5 L/hr into the lungs continuous.   pantoprazole  40 MG tablet Commonly known as: PROTONIX  TAKE 1 TABLET (40 MG TOTAL) BY MOUTH DAILY.   Polyethyl Glycol-Propyl Glycol 0.4-0.3 % Soln Apply 1 drop to eye daily as needed (dry eyes).   pravastatin  40 MG tablet Commonly known as: PRAVACHOL  Take 1 tablet (40 mg total) by mouth at bedtime.   promethazine  25 MG tablet Commonly known as: PHENERGAN  Take 25 mg by mouth every 8 (eight) hours as needed for nausea or vomiting.   Zovirax 5 % Generic drug: acyclovir ointment Apply 1 application topically as needed (flair).        Allergies:  Allergies  Allergen Reactions   Epinephrine Palpitations    Raises heart rate    Flonase  [Fluticasone ] Palpitations   Protriptyline Hcl Other (See Comments)    Severe constipation   Tamiflu  [Oseltamivir ] Anaphylaxis   Tizanidine Other (See Comments)    panic   Gabapentin Other (See Comments)    arthralgia Other reaction(s): Arthralgia (Joint Pain)   Other Hives and Rash    boiron acteane   Qvar  [Beclomethasone] Other (See Comments)    Spiriva  [Tiotropium Bromide ] Rash   Tiotropium Rash   Zonegran Other (See Comments)    Mood swings   Advair Diskus [Fluticasone -Salmeterol] Rash   Baclofen Rash   Chlorzoxazone Rash   Lamotrigine Rash   Levofloxacin Rash   Ropinirole Rash   Tiotropium Bromide  Rash    Other Reaction(s): Not available  tiotropium bromide    Verapamil Rash    Past Medical History, Surgical history, Social history, and Family History were reviewed and updated.  Review of Systems: All other 10 point review of systems is negative.   Physical Exam:  height is 5' 7 (1.702 m) and weight is 173 lb 12.8 oz (78.8 kg). Her oral temperature is 98.3  F (36.8 C). Her blood pressure is 127/60 and her pulse is 71. Her respiration is 20 and oxygen  saturation is 95%.   Wt Readings from Last 3 Encounters:  10/12/24 173 lb 12.8 oz (78.8 kg)  09/22/24 172 lb 12.8 oz (78.4 kg)  09/14/24 174 lb (78.9 kg)    Ocular: Sclerae unicteric, pupils equal, round and reactive to light Ear-nose-throat: Oropharynx clear, dentition fair Lymphatic: No cervical or supraclavicular adenopathy Lungs no rales or rhonchi, good excursion bilaterally Heart regular rate and rhythm, no murmur appreciated Abd soft, nontender, positive bowel sounds MSK no focal spinal tenderness, no joint edema Neuro: non-focal, well-oriented, appropriate affect Breasts: Deferred   Lab Results  Component Value Date   WBC 5.8 10/12/2024   HGB 11.6 (L) 10/12/2024   HCT 38.2 10/12/2024   MCV 74.3 (L) 10/12/2024   PLT 276 10/12/2024   Lab Results  Component Value Date   FERRITIN 6 (L) 09/14/2024   IRON 28 09/14/2024   TIBC 528 (H) 09/14/2024   UIBC 500 09/14/2024   IRONPCTSAT 5 (L) 09/14/2024   Lab Results  Component Value Date   RETICCTPCT 0.9 10/12/2024   RBC 5.09 10/12/2024   RETICCTABS 55.1 04/27/2015   No results found for: KPAFRELGTCHN, LAMBDASER, KAPLAMBRATIO No results found for: KIMBERLY LE, IGMSERUM No results  found for: STEPHANY CARLOTA BENSON MARKEL EARLA JOANNIE DOC VICK, SPEI   Chemistry      Component Value Date/Time   NA 141 09/14/2024 1244   NA 141 04/14/2024 1541   NA 147 (H) 10/08/2017 1410   NA 141 02/17/2017 1132   K 4.5 09/14/2024 1244   K 4.6 10/08/2017 1410   K 5.3 (H) 02/17/2017 1132   CL 106 09/14/2024 1244   CL 106 10/08/2017 1410   CO2 26 09/14/2024 1244   CO2 30 10/08/2017 1410   CO2 29 02/17/2017 1132   BUN 17 09/14/2024 1244   BUN 16 04/14/2024 1541   BUN 15 10/08/2017 1410   BUN 17.8 02/17/2017 1132   CREATININE 0.77 09/14/2024 1244   CREATININE 1.0 10/08/2017 1410   CREATININE 0.8 02/17/2017 1132      Component Value Date/Time   CALCIUM 9.1 09/14/2024 1244   CALCIUM 9.0 10/08/2017 1410   CALCIUM 9.3 02/17/2017 1132   ALKPHOS 65 09/14/2024 1244   ALKPHOS 59 10/08/2017 1410   ALKPHOS 79 02/17/2017 1132   AST 29 09/14/2024 1244   AST 22 02/17/2017 1132   ALT 16 09/14/2024 1244   ALT 26 10/08/2017 1410   ALT 20 02/17/2017 1132   BILITOT 0.4 09/14/2024 1244   BILITOT 0.39 02/17/2017 1132       Impression and Plan:  Ms. Schoon is a very pleasant 73 yo caucasian female with secondary polycythemia (JAK-2 negative) due to COPD.  Phlebotomy needed for Hct 38.2%. She will also get replacement fluids.  Iron studies pending.  Follow-up in 8 weeks.   Lauraine Pepper, NP 11/11/20251:26 PM

## 2024-10-21 ENCOUNTER — Telehealth: Payer: Self-pay

## 2024-10-21 DIAGNOSIS — J9611 Chronic respiratory failure with hypoxia: Secondary | ICD-10-CM

## 2024-10-21 MED ORDER — ALBUTEROL SULFATE (2.5 MG/3ML) 0.083% IN NEBU: 2.5000 mg | INHALATION_SOLUTION | Freq: Four times a day (QID) | RESPIRATORY_TRACT | 1 refills | Status: AC | PRN

## 2024-10-21 MED ORDER — ALBUTEROL SULFATE HFA 108 (90 BASE) MCG/ACT IN AERS: 2.0000 | INHALATION_SPRAY | Freq: Four times a day (QID) | RESPIRATORY_TRACT | 3 refills | Status: AC | PRN

## 2024-10-21 MED ORDER — MOMETASONE FUROATE 50 MCG/ACT NA SUSP: 2.0000 | Freq: Every day | NASAL | 5 refills | Status: AC

## 2024-10-21 MED ORDER — AZELASTINE HCL 0.1 % NA SOLN: 1.0000 | Freq: Two times a day (BID) | NASAL | 0 refills | Status: DC

## 2024-10-21 NOTE — Telephone Encounter (Signed)
 Copied from CRM #[id number]. Topic: Clinical - Prescription Issue >> Oct 21, 2024 10:48 AM Celestine FALCON wrote: Reason for CRM: Pt stated she just saw Dr. Neysa and he has a list of medications she needs refilled. Pt wanted to remind Dr. Neysa that he needs to send them to [facility] at  [facility] - [place], [place] - [address] [facility] [address] [place] [address] Phone: [phone] Fax: [phone] Hours: Not open 24 hours  Pt's phone number is [phone] ok to leave a vm.   Called and spoke to pt. Pt requested refill on Nasonex , albuterol , and astelin . Refill was sent to [facility]. NFN  Dr. Neysa - this pt was also requesting a refill of her benzonate, although I am not seeing it in her medication list. Could you please advise on the benzonatate  refill?

## 2024-10-22 MED ORDER — BENZONATATE 200 MG PO CAPS: 200.0000 mg | ORAL_CAPSULE | Freq: Three times a day (TID) | ORAL | 4 refills | Status: DC | PRN

## 2024-10-22 MED ORDER — AZELASTINE HCL 0.1 % NA SOLN: 1.0000 | Freq: Two times a day (BID) | NASAL | 3 refills | Status: AC

## 2024-10-22 NOTE — Telephone Encounter (Signed)
 Requested refills sent. Please let us  know if we can do anything else to help.

## 2024-10-22 NOTE — Telephone Encounter (Signed)
 Called and spoke with patient husband relayed information.NFN

## 2024-11-03 ENCOUNTER — Encounter: Payer: Self-pay | Admitting: Internal Medicine

## 2024-11-04 ENCOUNTER — Other Ambulatory Visit: Payer: Self-pay

## 2024-11-04 MED ORDER — FEXOFENADINE HCL 180 MG PO TABS: 180.0000 mg | ORAL_TABLET | Freq: Every day | ORAL | 1 refills | Status: AC

## 2024-12-07 ENCOUNTER — Encounter: Payer: Self-pay | Admitting: Family

## 2024-12-07 ENCOUNTER — Inpatient Hospital Stay (HOSPITAL_BASED_OUTPATIENT_CLINIC_OR_DEPARTMENT_OTHER): Admitting: Family

## 2024-12-07 ENCOUNTER — Inpatient Hospital Stay: Attending: Hematology & Oncology

## 2024-12-07 ENCOUNTER — Other Ambulatory Visit: Payer: Self-pay

## 2024-12-07 ENCOUNTER — Inpatient Hospital Stay

## 2024-12-07 VITALS — BP 106/63 | HR 79 | Temp 98.3°F | Resp 18 | Ht 67.0 in | Wt 174.1 lb

## 2024-12-07 DIAGNOSIS — Z8673 Personal history of transient ischemic attack (TIA), and cerebral infarction without residual deficits: Secondary | ICD-10-CM

## 2024-12-07 DIAGNOSIS — D500 Iron deficiency anemia secondary to blood loss (chronic): Secondary | ICD-10-CM

## 2024-12-07 DIAGNOSIS — K922 Gastrointestinal hemorrhage, unspecified: Secondary | ICD-10-CM | POA: Insufficient documentation

## 2024-12-07 DIAGNOSIS — D45 Polycythemia vera: Secondary | ICD-10-CM

## 2024-12-07 DIAGNOSIS — D751 Secondary polycythemia: Secondary | ICD-10-CM | POA: Diagnosis not present

## 2024-12-07 LAB — CMP (CANCER CENTER ONLY)
ALT: 17 U/L (ref 0–44)
AST: 25 U/L (ref 15–41)
Albumin: 4 g/dL (ref 3.5–5.0)
Alkaline Phosphatase: 67 U/L (ref 38–126)
Anion gap: 9 (ref 5–15)
BUN: 18 mg/dL (ref 8–23)
CO2: 25 mmol/L (ref 22–32)
Calcium: 9.1 mg/dL (ref 8.9–10.3)
Chloride: 106 mmol/L (ref 98–111)
Creatinine: 0.71 mg/dL (ref 0.44–1.00)
GFR, Estimated: >60 mL/min
Glucose, Bld: 76 mg/dL (ref 70–99)
Potassium: 4.6 mmol/L (ref 3.5–5.1)
Sodium: 139 mmol/L (ref 135–145)
Total Bilirubin: 0.5 mg/dL (ref 0.0–1.2)
Total Protein: 6.4 g/dL — ABNORMAL LOW (ref 6.5–8.1)

## 2024-12-07 LAB — CBC WITH DIFFERENTIAL (CANCER CENTER ONLY)
Abs Immature Granulocytes: 0.02 K/uL (ref 0.00–0.07)
Basophils Absolute: 0 K/uL (ref 0.0–0.1)
Basophils Relative: 1 %
Eosinophils Absolute: 0.1 K/uL (ref 0.0–0.5)
Eosinophils Relative: 2 %
HCT: 36.1 % (ref 36.0–46.0)
Hemoglobin: 10.9 g/dL — ABNORMAL LOW (ref 12.0–15.0)
Immature Granulocytes: 0 %
Lymphocytes Relative: 20 %
Lymphs Abs: 1.1 K/uL (ref 0.7–4.0)
MCH: 22.1 pg — ABNORMAL LOW (ref 26.0–34.0)
MCHC: 30.2 g/dL (ref 30.0–36.0)
MCV: 73.2 fL — ABNORMAL LOW (ref 80.0–100.0)
Monocytes Absolute: 0.6 K/uL (ref 0.1–1.0)
Monocytes Relative: 11 %
Neutro Abs: 3.5 K/uL (ref 1.7–7.7)
Neutrophils Relative %: 66 %
Platelet Count: 245 K/uL (ref 150–400)
RBC: 4.93 MIL/uL (ref 3.87–5.11)
RDW: 17 % — ABNORMAL HIGH (ref 11.5–15.5)
WBC Count: 5.3 K/uL (ref 4.0–10.5)
nRBC: 0 % (ref 0.0–0.2)

## 2024-12-07 LAB — IRON AND IRON BINDING CAPACITY (CC-WL,HP ONLY)
Iron: 36 ug/dL (ref 28–170)
Saturation Ratios: 7 % — ABNORMAL LOW (ref 10.4–31.8)
TIBC: 505 ug/dL — ABNORMAL HIGH (ref 250–450)
UIBC: 470 ug/dL

## 2024-12-07 LAB — FERRITIN: Ferritin: 9 ng/mL — ABNORMAL LOW (ref 11–307)

## 2024-12-07 NOTE — Progress Notes (Signed)
 " Hematology and Oncology Follow Up Visit  Natalie Hendrix [id number] 12-27-1950 74 y.o. 12/07/2024   Principle Diagnosis:  Secondary polycythemia - JAK2 negative IDA - felt to be secondary to potential GI bleed chronic gastritis - positive Cologuard    Current Therapy:        Phlebotomy to maintain hematocrit less than 38% Plavix  75 mg by mouth daily  Interim History:  Natalie Hendrix is here today for follow-up. She is doing fairly well. She still notes fatigue as well as itching specifically at night. She takes Atarax  at bedtime to help with this.  No fever, chills, n/v, cough, rash, chest pain, palpitations, abdominal pain or changes in bowel or bladder habits.  Some SOB and dizziness with over exertion unchanged form baseline. She uses her supplemental O2 throughout the day and night which helps.  No swelling noted in her extremities.  Neuropathy unchanged.  No new falls or syncope.  Appetite and hydration are good. Weight is stable at 174 lbs.   ECOG Performance Status: 1 - Symptomatic but completely ambulatory  Medications:  Allergies as of 12/07/2024       Reactions   Epinephrine Palpitations   Raises heart rate   Flonase  [fluticasone ] Palpitations   Protriptyline Hcl Other (See Comments)   Severe constipation   Tamiflu  [oseltamivir ] Anaphylaxis   Tizanidine Other (See Comments)   panic   Gabapentin Other (See Comments)   arthralgia Other reaction(s): Arthralgia (Joint Pain)   Other Hives, Rash   boiron acteane   Qvar  [beclomethasone] Other (See Comments)   Spiriva  [tiotropium Bromide ] Rash   Tiotropium Rash   Zonegran Other (See Comments)   Mood swings   Advair Diskus [fluticasone -salmeterol] Rash   Baclofen Rash   Chlorzoxazone Rash   Lamotrigine Rash   Levofloxacin Rash   Ropinirole Rash   Tiotropium Bromide  Rash   Other Reaction(s): Not available tiotropium bromide    Verapamil Rash        Medication List        Accurate as of December 07, 2024  1:19 PM.  If you have any questions, ask your nurse or doctor.          albuterol  108 (90 Base) MCG/ACT inhaler Commonly known as: ProAir  HFA Inhale 2 puffs into the lungs every 6 (six) hours as needed for wheezing or shortness of breath.   albuterol  (2.5 MG/3ML) 0.083% nebulizer solution Commonly known as: PROVENTIL  Take 3 mLs (2.5 mg total) by nebulization every 6 (six) hours as needed for wheezing or shortness of breath.   azelastine  0.1 % nasal spray Commonly known as: ASTELIN  Place 1 spray into both nostrils 2 (two) times daily.   benzonatate  200 MG capsule Commonly known as: TESSALON  Take 1 capsule (200 mg total) by mouth 3 (three) times daily as needed for cough.   buPROPion  300 MG 24 hr tablet Commonly known as: WELLBUTRIN  XL Take 1 tablet (300 mg total) by mouth daily with breakfast.   CALCIUM 1200 PO Take 1,250 mg by mouth daily.   Centrum Silver tablet Take 1 tablet by mouth daily.   clopidogrel  75 MG tablet Commonly known as: PLAVIX  Take 1 tablet (75 mg total) by mouth daily.   cyclobenzaprine 10 MG tablet Commonly known as: FLEXERIL Take 10 mg by mouth every 8 (eight) hours as needed.   ESTRACE  VAGINAL 0.1 MG/GM Crea vaginal cream Generic drug: estradiol  Place 1 Applicatorful vaginally daily. Small amount each dose per pt   ezetimibe  10 MG tablet Commonly known as:  Zetia  TAKE 1 TABLET BY MOUTH DAILY.   fexofenadine  180 MG tablet Commonly known as: Allegra  Allergy  Take 1 tablet (180 mg total) by mouth daily.   Fish Oil 1200 MG Caps Take 1,200 mg by mouth at bedtime.   hydrOXYzine  10 MG tablet Commonly known as: ATARAX  Take 1 tablet (10 mg total) by mouth 3 (three) times daily as needed.   hyoscyamine  0.125 MG Tbdp disintergrating tablet Commonly known as: ANASPAZ  Place 1 tablet (0.125 mg total) under the tongue 2 (two) times daily as needed.   lidocaine  5 % Commonly known as: LIDODERM  Place 1 patch onto the skin as needed (pain). Remove & Discard  patch within 12 hours or as directed by MD   LORazepam  1 MG tablet Commonly known as: ATIVAN  Take 1 tablet (1 mg total) by mouth 2 (two) times daily. May take extra 3rd dose if needed   meclizine  25 MG tablet Commonly known as: ANTIVERT  Take 1 tablet (25 mg total) by mouth 3 (three) times daily as needed for dizziness. 3 month supply   metoprolol  succinate 50 MG 24 hr tablet Commonly known as: TOPROL -XL Take 1 tablet (50 mg total) by mouth daily with breakfast.   metoprolol  succinate 25 MG 24 hr tablet Commonly known as: TOPROL -XL Take 25 mg by mouth daily.   mometasone  50 MCG/ACT nasal spray Commonly known as: NASONEX  Place 2 sprays into the nose daily.   morphine 15 MG tablet Commonly known as: MSIR Take 15 mg by mouth every 4 (four) hours as needed for severe pain.   morphine 30 MG 12 hr tablet Commonly known as: MS CONTIN Take 1 tablet by mouth every 12 (twelve) hours.   Nebulizer Devi Use as directed   OXYGEN  Inhale 2.5 L/hr into the lungs continuous.   pantoprazole  40 MG tablet Commonly known as: PROTONIX  TAKE 1 TABLET (40 MG TOTAL) BY MOUTH DAILY.   Polyethyl Glycol-Propyl Glycol 0.4-0.3 % Soln Apply 1 drop to eye daily as needed (dry eyes).   pravastatin  40 MG tablet Commonly known as: PRAVACHOL  Take 1 tablet (40 mg total) by mouth at bedtime.   promethazine  25 MG tablet Commonly known as: PHENERGAN  Take 25 mg by mouth every 8 (eight) hours as needed for nausea or vomiting.   Zovirax 5 % Generic drug: acyclovir ointment Apply 1 application topically as needed (flair).        Allergies: Allergies[1]  Past Medical History, Surgical history, Social history, and Family History were reviewed and updated.  Review of Systems: All other 10 point review of systems is negative.   Physical Exam:  vitals were not taken for this visit.   Wt Readings from Last 3 Encounters:  10/12/24 173 lb 12.8 oz (78.8 kg)  09/22/24 172 lb 12.8 oz (78.4 kg)   09/14/24 174 lb (78.9 kg)    Ocular: Sclerae unicteric, pupils equal, round and reactive to light Ear-nose-throat: Oropharynx clear, dentition fair Lymphatic: No cervical or supraclavicular adenopathy Lungs no rales or rhonchi, good excursion bilaterally Heart regular rate and rhythm, no murmur appreciated Abd soft, nontender, positive bowel sounds MSK no focal spinal tenderness, no joint edema Neuro: non-focal, well-oriented, appropriate affect Breasts: Deferred   Lab Results  Component Value Date   WBC 5.8 10/12/2024   HGB 11.6 (L) 10/12/2024   HCT 38.2 10/12/2024   MCV 74.3 (L) 10/12/2024   PLT 276 10/12/2024   Lab Results  Component Value Date   FERRITIN 7 (L) 10/12/2024   IRON 29 10/12/2024  TIBC 535 (H) 10/12/2024   UIBC 506 10/12/2024   IRONPCTSAT 5 (L) 10/12/2024   Lab Results  Component Value Date   RETICCTPCT 0.9 10/12/2024   RBC 5.09 10/12/2024   RETICCTABS 55.1 04/27/2015   No results found for: KPAFRELGTCHN, LAMBDASER, KAPLAMBRATIO No results found for: KIMBERLY LE, IGMSERUM No results found for: STEPHANY CARLOTA BENSON MARKEL EARLA JOANNIE DOC VICK, SPEI   Chemistry      Component Value Date/Time   NA 143 10/12/2024 1248   NA 141 04/14/2024 1541   NA 147 (H) 10/08/2017 1410   NA 141 02/17/2017 1132   K 4.3 10/12/2024 1248   K 4.6 10/08/2017 1410   K 5.3 (H) 02/17/2017 1132   CL 107 10/12/2024 1248   CL 106 10/08/2017 1410   CO2 25 10/12/2024 1248   CO2 30 10/08/2017 1410   CO2 29 02/17/2017 1132   BUN 16 10/12/2024 1248   BUN 16 04/14/2024 1541   BUN 15 10/08/2017 1410   BUN 17.8 02/17/2017 1132   CREATININE 0.76 10/12/2024 1248   CREATININE 1.0 10/08/2017 1410   CREATININE 0.8 02/17/2017 1132      Component Value Date/Time   CALCIUM 9.1 10/12/2024 1248   CALCIUM 9.0 10/08/2017 1410   CALCIUM 9.3 02/17/2017 1132   ALKPHOS 67 10/12/2024 1248   ALKPHOS 59 10/08/2017 1410   ALKPHOS 79  02/17/2017 1132   AST 32 10/12/2024 1248   AST 22 02/17/2017 1132   ALT 24 10/12/2024 1248   ALT 26 10/08/2017 1410   ALT 20 02/17/2017 1132   BILITOT 0.4 10/12/2024 1248   BILITOT 0.39 02/17/2017 1132       Impression and Plan: Ms. Dambrosia is a very pleasant 74 yo caucasian female with secondary polycythemia (JAK-2 negative) due to COPD.  No phlebotomy needed for Hct 36%. Iron studies pending.  Follow-up in 8 weeks.  Lauraine Pepper, NP 1/6/20261:19 PM     [1]  Allergies Allergen Reactions   Epinephrine Palpitations    Raises heart rate    Flonase  [Fluticasone ] Palpitations   Protriptyline Hcl Other (See Comments)    Severe constipation   Tamiflu  [Oseltamivir ] Anaphylaxis   Tizanidine Other (See Comments)    panic   Gabapentin Other (See Comments)    arthralgia Other reaction(s): Arthralgia (Joint Pain)   Other Hives and Rash    boiron acteane   Qvar  [Beclomethasone] Other (See Comments)   Spiriva  [Tiotropium Bromide ] Rash   Tiotropium Rash   Zonegran Other (See Comments)    Mood swings   Advair Diskus [Fluticasone -Salmeterol] Rash   Baclofen Rash   Chlorzoxazone Rash   Lamotrigine Rash   Levofloxacin Rash   Ropinirole Rash   Tiotropium Bromide  Rash    Other Reaction(s): Not available  tiotropium bromide    Verapamil Rash   "

## 2024-12-13 ENCOUNTER — Telehealth: Payer: Self-pay | Admitting: *Deleted

## 2024-12-13 NOTE — Telephone Encounter (Signed)
 Patient called stating that Dr Burney her GP did a biopsy of a chest lesion.  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  They want to refer her to the [facility].  She is asking for Dr Melvern opinion as to whether this is the correct option.  Dr Ennever absolutely recommends this as the next step in her treatment to go to the skin surgery center.  Patient appreciates this information

## 2024-12-21 ENCOUNTER — Other Ambulatory Visit: Payer: Self-pay | Admitting: Obstetrics and Gynecology

## 2024-12-21 DIAGNOSIS — Z1231 Encounter for screening mammogram for malignant neoplasm of breast: Secondary | ICD-10-CM

## 2024-12-22 ENCOUNTER — Other Ambulatory Visit: Payer: Self-pay | Admitting: Internal Medicine

## 2024-12-31 NOTE — Telephone Encounter (Signed)
 Dr. Pawar, please advise on refill for benzonatate .  Thank you.

## 2025-02-01 ENCOUNTER — Inpatient Hospital Stay: Admitting: Family

## 2025-02-01 ENCOUNTER — Inpatient Hospital Stay

## 2025-06-21 ENCOUNTER — Ambulatory Visit
